# Patient Record
Sex: Female | Born: 1945 | Race: White | Hispanic: No | Smoking: Former smoker
Health system: Southern US, Community
[De-identification: ages and names within clinical notes are randomized; demographics above are authoritative.]

## PROBLEM LIST (undated history)

## (undated) DIAGNOSIS — H409 Unspecified glaucoma: Secondary | ICD-10-CM

## (undated) DIAGNOSIS — E785 Hyperlipidemia, unspecified: Secondary | ICD-10-CM

## (undated) DIAGNOSIS — C50212 Malignant neoplasm of upper-inner quadrant of left female breast: Principal | ICD-10-CM

## (undated) DIAGNOSIS — F419 Anxiety disorder, unspecified: Secondary | ICD-10-CM

## (undated) DIAGNOSIS — C50919 Malignant neoplasm of unspecified site of unspecified female breast: Secondary | ICD-10-CM

## (undated) DIAGNOSIS — N952 Postmenopausal atrophic vaginitis: Secondary | ICD-10-CM

## (undated) DIAGNOSIS — Z8719 Personal history of other diseases of the digestive system: Secondary | ICD-10-CM

## (undated) DIAGNOSIS — IMO0001 Reserved for inherently not codable concepts without codable children: Secondary | ICD-10-CM

## (undated) DIAGNOSIS — K219 Gastro-esophageal reflux disease without esophagitis: Secondary | ICD-10-CM

## (undated) DIAGNOSIS — K5790 Diverticulosis of intestine, part unspecified, without perforation or abscess without bleeding: Secondary | ICD-10-CM

## (undated) DIAGNOSIS — R7301 Impaired fasting glucose: Secondary | ICD-10-CM

## (undated) DIAGNOSIS — A64 Unspecified sexually transmitted disease: Secondary | ICD-10-CM

## (undated) DIAGNOSIS — IMO0002 Reserved for concepts with insufficient information to code with codable children: Secondary | ICD-10-CM

## (undated) DIAGNOSIS — B009 Herpesviral infection, unspecified: Secondary | ICD-10-CM

## (undated) DIAGNOSIS — N6009 Solitary cyst of unspecified breast: Secondary | ICD-10-CM

## (undated) HISTORY — DX: Malignant neoplasm of upper-inner quadrant of left female breast: C50.212

## (undated) HISTORY — DX: Hyperlipidemia, unspecified: E78.5

## (undated) HISTORY — DX: Reserved for concepts with insufficient information to code with codable children: IMO0002

## (undated) HISTORY — DX: Unspecified sexually transmitted disease: A64

## (undated) HISTORY — DX: Unspecified glaucoma: H40.9

## (undated) HISTORY — DX: Reserved for inherently not codable concepts without codable children: IMO0001

## (undated) HISTORY — PX: PELVIC LAPAROSCOPY: SHX162

## (undated) HISTORY — PX: BREAST CYST ASPIRATION: SHX578

## (undated) HISTORY — DX: Impaired fasting glucose: R73.01

## (undated) HISTORY — PX: UPPER GASTROINTESTINAL ENDOSCOPY: SHX188

## (undated) HISTORY — PX: FLEXIBLE SIGMOIDOSCOPY: SHX1649

## (undated) HISTORY — DX: Gastro-esophageal reflux disease without esophagitis: K21.9

## (undated) HISTORY — DX: Postmenopausal atrophic vaginitis: N95.2

## (undated) HISTORY — DX: Herpesviral infection, unspecified: B00.9

## (undated) HISTORY — DX: Anxiety disorder, unspecified: F41.9

## (undated) HISTORY — DX: Solitary cyst of unspecified breast: N60.09

## (undated) HISTORY — DX: Diverticulosis of intestine, part unspecified, without perforation or abscess without bleeding: K57.90

## (undated) HISTORY — DX: Gilbert syndrome: E80.4

## (undated) HISTORY — DX: Malignant neoplasm of unspecified site of unspecified female breast: C50.919

---
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
## 1973-05-07 HISTORY — PX: DILATION AND CURETTAGE OF UTERUS: SHX78

## 1976-05-07 HISTORY — PX: WISDOM TOOTH EXTRACTION: SHX21

## 1998-12-08 ENCOUNTER — Other Ambulatory Visit: Admission: RE | Admit: 1998-12-08 | Discharge: 1998-12-08 | Payer: Self-pay | Admitting: Obstetrics and Gynecology

## 1999-08-15 ENCOUNTER — Encounter: Admission: RE | Admit: 1999-08-15 | Discharge: 1999-08-15 | Payer: Self-pay | Admitting: *Deleted

## 1999-08-15 ENCOUNTER — Encounter: Payer: Self-pay | Admitting: *Deleted

## 2000-01-01 ENCOUNTER — Other Ambulatory Visit: Admission: RE | Admit: 2000-01-01 | Discharge: 2000-01-01 | Payer: Self-pay | Admitting: Obstetrics and Gynecology

## 2000-08-11 ENCOUNTER — Encounter: Payer: Self-pay | Admitting: Emergency Medicine

## 2000-08-11 ENCOUNTER — Ambulatory Visit (HOSPITAL_COMMUNITY): Admission: RE | Admit: 2000-08-11 | Discharge: 2000-08-11 | Payer: Self-pay | Admitting: Emergency Medicine

## 2000-08-15 ENCOUNTER — Other Ambulatory Visit: Admission: RE | Admit: 2000-08-15 | Discharge: 2000-08-15 | Payer: Self-pay | Admitting: Obstetrics and Gynecology

## 2000-08-16 ENCOUNTER — Encounter: Admission: RE | Admit: 2000-08-16 | Discharge: 2000-08-16 | Payer: Self-pay | Admitting: Obstetrics and Gynecology

## 2000-08-16 ENCOUNTER — Encounter: Payer: Self-pay | Admitting: Obstetrics and Gynecology

## 2002-04-23 ENCOUNTER — Other Ambulatory Visit: Admission: RE | Admit: 2002-04-23 | Discharge: 2002-04-23 | Payer: Self-pay | Admitting: Gynecology

## 2003-02-24 ENCOUNTER — Other Ambulatory Visit: Admission: RE | Admit: 2003-02-24 | Discharge: 2003-02-24 | Payer: Self-pay | Admitting: Radiology

## 2003-05-28 ENCOUNTER — Encounter: Admission: RE | Admit: 2003-05-28 | Discharge: 2003-05-28 | Payer: Self-pay | Admitting: Internal Medicine

## 2003-06-09 ENCOUNTER — Other Ambulatory Visit: Admission: RE | Admit: 2003-06-09 | Discharge: 2003-06-09 | Payer: Self-pay | Admitting: Obstetrics and Gynecology

## 2003-09-09 ENCOUNTER — Encounter: Payer: Self-pay | Admitting: Internal Medicine

## 2004-06-07 ENCOUNTER — Ambulatory Visit: Payer: Self-pay | Admitting: Internal Medicine

## 2004-07-13 ENCOUNTER — Other Ambulatory Visit: Admission: RE | Admit: 2004-07-13 | Discharge: 2004-07-13 | Payer: Self-pay | Admitting: Obstetrics and Gynecology

## 2004-10-23 ENCOUNTER — Ambulatory Visit: Payer: Self-pay | Admitting: Internal Medicine

## 2004-12-14 ENCOUNTER — Ambulatory Visit: Payer: Self-pay | Admitting: Internal Medicine

## 2005-01-31 ENCOUNTER — Ambulatory Visit: Payer: Self-pay | Admitting: Internal Medicine

## 2005-02-26 ENCOUNTER — Ambulatory Visit: Payer: Self-pay | Admitting: Internal Medicine

## 2005-04-16 ENCOUNTER — Ambulatory Visit: Payer: Self-pay | Admitting: Internal Medicine

## 2005-05-07 HISTORY — PX: COLONOSCOPY: SHX174

## 2005-05-28 ENCOUNTER — Ambulatory Visit: Payer: Self-pay | Admitting: Internal Medicine

## 2005-06-11 ENCOUNTER — Ambulatory Visit: Payer: Self-pay | Admitting: Internal Medicine

## 2005-06-25 ENCOUNTER — Ambulatory Visit: Payer: Self-pay | Admitting: Internal Medicine

## 2005-08-06 ENCOUNTER — Ambulatory Visit: Payer: Self-pay | Admitting: Internal Medicine

## 2005-08-13 ENCOUNTER — Ambulatory Visit: Payer: Self-pay | Admitting: Internal Medicine

## 2006-01-10 ENCOUNTER — Ambulatory Visit: Payer: Self-pay | Admitting: Internal Medicine

## 2006-01-17 ENCOUNTER — Other Ambulatory Visit: Admission: RE | Admit: 2006-01-17 | Discharge: 2006-01-17 | Payer: Self-pay | Admitting: Obstetrics and Gynecology

## 2006-01-17 ENCOUNTER — Ambulatory Visit: Payer: Self-pay | Admitting: Internal Medicine

## 2006-01-31 ENCOUNTER — Ambulatory Visit: Payer: Self-pay

## 2006-08-09 DIAGNOSIS — IMO0002 Reserved for concepts with insufficient information to code with codable children: Secondary | ICD-10-CM | POA: Insufficient documentation

## 2006-08-09 DIAGNOSIS — N6009 Solitary cyst of unspecified breast: Secondary | ICD-10-CM

## 2006-09-26 ENCOUNTER — Ambulatory Visit: Payer: Self-pay | Admitting: Internal Medicine

## 2006-09-27 LAB — CONVERTED CEMR LAB
ALT: 29 units/L (ref 0–40)
AST: 31 units/L (ref 0–37)
Hgb A1c MFr Bld: 6.1 % — ABNORMAL HIGH (ref 4.6–6.0)
Triglycerides: 111 mg/dL (ref 0–149)
VLDL: 22 mg/dL (ref 0–40)

## 2006-10-02 ENCOUNTER — Encounter: Payer: Self-pay | Admitting: Internal Medicine

## 2006-10-02 ENCOUNTER — Ambulatory Visit: Payer: Self-pay | Admitting: Internal Medicine

## 2006-12-18 ENCOUNTER — Ambulatory Visit: Payer: Self-pay | Admitting: Internal Medicine

## 2006-12-26 ENCOUNTER — Ambulatory Visit: Payer: Self-pay | Admitting: Internal Medicine

## 2006-12-26 ENCOUNTER — Encounter (INDEPENDENT_AMBULATORY_CARE_PROVIDER_SITE_OTHER): Payer: Self-pay | Admitting: *Deleted

## 2006-12-26 DIAGNOSIS — E785 Hyperlipidemia, unspecified: Secondary | ICD-10-CM

## 2006-12-26 LAB — CONVERTED CEMR LAB
Creatinine,U: 44.7 mg/dL
Microalb, Ur: 0.3 mg/dL (ref 0.0–1.9)

## 2007-01-03 ENCOUNTER — Ambulatory Visit: Payer: Self-pay | Admitting: Internal Medicine

## 2007-01-06 LAB — CONVERTED CEMR LAB
ALT: 22 units/L (ref 0–35)
AST: 22 units/L (ref 0–37)
Hgb A1c MFr Bld: 6 % (ref 4.6–6.0)
LDL Cholesterol: 134 mg/dL — ABNORMAL HIGH (ref 0–99)
Total CK: 60 units/L (ref 7–177)
VLDL: 28 mg/dL (ref 0–40)

## 2007-01-07 ENCOUNTER — Encounter (INDEPENDENT_AMBULATORY_CARE_PROVIDER_SITE_OTHER): Payer: Self-pay | Admitting: *Deleted

## 2007-01-14 ENCOUNTER — Telehealth (INDEPENDENT_AMBULATORY_CARE_PROVIDER_SITE_OTHER): Payer: Self-pay | Admitting: *Deleted

## 2007-03-19 ENCOUNTER — Encounter: Payer: Self-pay | Admitting: Internal Medicine

## 2007-03-26 ENCOUNTER — Other Ambulatory Visit: Admission: RE | Admit: 2007-03-26 | Discharge: 2007-03-26 | Payer: Self-pay | Admitting: Obstetrics and Gynecology

## 2007-08-20 ENCOUNTER — Telehealth (INDEPENDENT_AMBULATORY_CARE_PROVIDER_SITE_OTHER): Payer: Self-pay | Admitting: *Deleted

## 2007-08-29 ENCOUNTER — Ambulatory Visit: Payer: Self-pay | Admitting: Internal Medicine

## 2007-09-07 LAB — CONVERTED CEMR LAB
AST: 30 units/L (ref 0–37)
HDL: 44 mg/dL (ref >39.0)
Hgb A1c MFr Bld: 6.1 % — ABNORMAL HIGH (ref 4.6–6.0)
Total CHOL/HDL Ratio: 4.5

## 2007-09-09 ENCOUNTER — Encounter (INDEPENDENT_AMBULATORY_CARE_PROVIDER_SITE_OTHER): Payer: Self-pay | Admitting: *Deleted

## 2007-09-18 ENCOUNTER — Ambulatory Visit: Payer: Self-pay | Admitting: Internal Medicine

## 2007-12-25 ENCOUNTER — Ambulatory Visit: Payer: Self-pay | Admitting: Internal Medicine

## 2007-12-29 ENCOUNTER — Telehealth (INDEPENDENT_AMBULATORY_CARE_PROVIDER_SITE_OTHER): Payer: Self-pay | Admitting: *Deleted

## 2007-12-29 LAB — CONVERTED CEMR LAB
BUN: 14 mg/dL (ref 6–23)
CO2: 30 meq/L (ref 19–32)
Chloride: 106 meq/L (ref 96–112)
Cholesterol: 189 mg/dL (ref 0–200)
Creatinine, Ser: 0.6 mg/dL (ref 0.4–1.2)
GFR calc non Af Amer: 108 mL/min
Hgb A1c MFr Bld: 6 % (ref 4.6–6.0)
LDL Cholesterol: 130 mg/dL — ABNORMAL HIGH (ref 0–99)
Potassium: 4.2 meq/L (ref 3.5–5.1)
Triglycerides: 79 mg/dL (ref 0–149)

## 2008-01-01 ENCOUNTER — Ambulatory Visit: Payer: Self-pay | Admitting: Internal Medicine

## 2008-01-01 DIAGNOSIS — R739 Hyperglycemia, unspecified: Secondary | ICD-10-CM

## 2008-01-01 LAB — CONVERTED CEMR LAB
Cholesterol, target level: 200 mg/dL
HDL goal, serum: 50 mg/dL
LDL Goal: 100 mg/dL

## 2008-03-23 ENCOUNTER — Encounter: Payer: Self-pay | Admitting: Internal Medicine

## 2008-04-07 ENCOUNTER — Encounter: Payer: Self-pay | Admitting: Obstetrics and Gynecology

## 2008-04-07 ENCOUNTER — Other Ambulatory Visit: Admission: RE | Admit: 2008-04-07 | Discharge: 2008-04-07 | Payer: Self-pay | Admitting: Obstetrics and Gynecology

## 2008-04-07 ENCOUNTER — Ambulatory Visit: Payer: Self-pay | Admitting: Obstetrics and Gynecology

## 2008-10-27 ENCOUNTER — Ambulatory Visit: Payer: Self-pay | Admitting: Internal Medicine

## 2008-10-27 ENCOUNTER — Telehealth (INDEPENDENT_AMBULATORY_CARE_PROVIDER_SITE_OTHER): Payer: Self-pay | Admitting: *Deleted

## 2008-10-27 DIAGNOSIS — F411 Generalized anxiety disorder: Secondary | ICD-10-CM | POA: Insufficient documentation

## 2008-11-16 ENCOUNTER — Telehealth (INDEPENDENT_AMBULATORY_CARE_PROVIDER_SITE_OTHER): Payer: Self-pay | Admitting: *Deleted

## 2008-11-30 ENCOUNTER — Ambulatory Visit: Payer: Self-pay | Admitting: Internal Medicine

## 2008-12-09 LAB — CONVERTED CEMR LAB
AST: 25 units/L (ref 0–37)
Albumin: 4.7 g/dL (ref 3.5–5.2)
Alkaline Phosphatase: 55 units/L (ref 39–117)
Alkaline Phosphatase: 55 units/L (ref 39–117)
Bilirubin, Direct: 0 mg/dL (ref 0.0–0.3)
Bilirubin, Direct: 0.2 mg/dL (ref 0.0–0.3)
HDL: 43 mg/dL (ref >39.00)
HDL: 46 mg/dL (ref >39)
Hgb A1c MFr Bld: 6 % (ref 4.6–6.5)
LDL Cholesterol: 130 mg/dL — ABNORMAL HIGH (ref 0–99)
Total Bilirubin: 1 mg/dL (ref 0.3–1.2)
Total CHOL/HDL Ratio: 4
Total CHOL/HDL Ratio: 4.4
Triglycerides: 114 mg/dL (ref 0.0–149.0)
VLDL: 22.8 mg/dL (ref 0.0–40.0)

## 2008-12-24 ENCOUNTER — Ambulatory Visit: Payer: Self-pay | Admitting: Internal Medicine

## 2009-03-24 ENCOUNTER — Encounter: Payer: Self-pay | Admitting: Internal Medicine

## 2009-04-08 ENCOUNTER — Other Ambulatory Visit: Admission: RE | Admit: 2009-04-08 | Discharge: 2009-04-08 | Payer: Self-pay | Admitting: Obstetrics and Gynecology

## 2009-04-08 ENCOUNTER — Ambulatory Visit: Payer: Self-pay | Admitting: Obstetrics and Gynecology

## 2009-05-19 ENCOUNTER — Telehealth (INDEPENDENT_AMBULATORY_CARE_PROVIDER_SITE_OTHER): Payer: Self-pay | Admitting: *Deleted

## 2009-06-13 ENCOUNTER — Telehealth (INDEPENDENT_AMBULATORY_CARE_PROVIDER_SITE_OTHER): Payer: Self-pay | Admitting: *Deleted

## 2009-06-28 ENCOUNTER — Telehealth: Payer: Self-pay | Admitting: Internal Medicine

## 2009-06-28 ENCOUNTER — Encounter: Payer: Self-pay | Admitting: Internal Medicine

## 2009-07-06 ENCOUNTER — Ambulatory Visit: Payer: Self-pay | Admitting: Internal Medicine

## 2009-07-07 ENCOUNTER — Encounter: Payer: Self-pay | Admitting: Internal Medicine

## 2009-07-14 ENCOUNTER — Ambulatory Visit: Payer: Self-pay | Admitting: Internal Medicine

## 2009-07-14 DIAGNOSIS — R9431 Abnormal electrocardiogram [ECG] [EKG]: Secondary | ICD-10-CM

## 2009-07-14 DIAGNOSIS — K219 Gastro-esophageal reflux disease without esophagitis: Secondary | ICD-10-CM

## 2009-07-25 ENCOUNTER — Ambulatory Visit: Payer: Self-pay | Admitting: Cardiology

## 2009-07-27 ENCOUNTER — Encounter: Payer: Self-pay | Admitting: Internal Medicine

## 2009-07-27 ENCOUNTER — Telehealth (INDEPENDENT_AMBULATORY_CARE_PROVIDER_SITE_OTHER): Payer: Self-pay | Admitting: *Deleted

## 2009-08-05 ENCOUNTER — Ambulatory Visit: Payer: Self-pay

## 2009-08-05 ENCOUNTER — Encounter: Payer: Self-pay | Admitting: Cardiology

## 2009-08-05 ENCOUNTER — Ambulatory Visit: Payer: Self-pay | Admitting: Cardiovascular Disease

## 2009-08-05 ENCOUNTER — Ambulatory Visit (HOSPITAL_COMMUNITY): Admission: RE | Admit: 2009-08-05 | Discharge: 2009-08-05 | Payer: Self-pay | Admitting: Cardiology

## 2009-08-11 ENCOUNTER — Ambulatory Visit: Payer: Self-pay | Admitting: Internal Medicine

## 2009-08-15 ENCOUNTER — Telehealth (INDEPENDENT_AMBULATORY_CARE_PROVIDER_SITE_OTHER): Payer: Self-pay | Admitting: *Deleted

## 2009-08-24 ENCOUNTER — Ambulatory Visit: Payer: Self-pay | Admitting: Cardiology

## 2009-08-29 ENCOUNTER — Telehealth: Payer: Self-pay | Admitting: Cardiology

## 2009-09-23 ENCOUNTER — Ambulatory Visit: Payer: Self-pay | Admitting: Internal Medicine

## 2009-09-27 ENCOUNTER — Telehealth: Payer: Self-pay | Admitting: Internal Medicine

## 2009-09-27 LAB — CONVERTED CEMR LAB
ALT: 29 units/L (ref 0–35)
AST: 29 units/L (ref 0–37)
Albumin: 4.2 g/dL (ref 3.5–5.2)
Alkaline Phosphatase: 54 units/L (ref 39–117)
Cholesterol: 208 mg/dL — ABNORMAL HIGH (ref 0–200)
Total CHOL/HDL Ratio: 4
Triglycerides: 135 mg/dL (ref 0.0–149.0)

## 2009-09-28 ENCOUNTER — Ambulatory Visit: Payer: Self-pay | Admitting: Internal Medicine

## 2009-09-28 DIAGNOSIS — R42 Dizziness and giddiness: Secondary | ICD-10-CM

## 2009-09-28 DIAGNOSIS — I8390 Asymptomatic varicose veins of unspecified lower extremity: Secondary | ICD-10-CM | POA: Insufficient documentation

## 2009-09-28 DIAGNOSIS — R51 Headache: Secondary | ICD-10-CM

## 2009-09-28 DIAGNOSIS — M79609 Pain in unspecified limb: Secondary | ICD-10-CM

## 2009-09-28 DIAGNOSIS — R519 Headache, unspecified: Secondary | ICD-10-CM | POA: Insufficient documentation

## 2009-10-11 ENCOUNTER — Encounter: Payer: Self-pay | Admitting: Internal Medicine

## 2009-10-21 ENCOUNTER — Telehealth (INDEPENDENT_AMBULATORY_CARE_PROVIDER_SITE_OTHER): Payer: Self-pay | Admitting: *Deleted

## 2010-01-26 ENCOUNTER — Encounter: Payer: Self-pay | Admitting: Internal Medicine

## 2010-01-26 ENCOUNTER — Ambulatory Visit: Payer: Self-pay | Admitting: Obstetrics and Gynecology

## 2010-03-28 ENCOUNTER — Encounter: Payer: Self-pay | Admitting: Internal Medicine

## 2010-04-13 ENCOUNTER — Encounter: Payer: Self-pay | Admitting: Internal Medicine

## 2010-04-13 ENCOUNTER — Emergency Department (HOSPITAL_COMMUNITY)
Admission: EM | Admit: 2010-04-13 | Discharge: 2010-04-13 | Payer: Self-pay | Source: Home / Self Care | Admitting: Emergency Medicine

## 2010-04-13 ENCOUNTER — Telehealth: Payer: Self-pay | Admitting: Internal Medicine

## 2010-04-14 ENCOUNTER — Telehealth: Payer: Self-pay | Admitting: Internal Medicine

## 2010-04-17 ENCOUNTER — Ambulatory Visit: Payer: Self-pay | Admitting: Internal Medicine

## 2010-04-17 DIAGNOSIS — R079 Chest pain, unspecified: Secondary | ICD-10-CM | POA: Insufficient documentation

## 2010-04-19 ENCOUNTER — Ambulatory Visit: Payer: Self-pay | Admitting: Obstetrics and Gynecology

## 2010-06-01 ENCOUNTER — Encounter: Payer: Self-pay | Admitting: Internal Medicine

## 2010-06-04 LAB — CONVERTED CEMR LAB
ALT: 39 units/L — ABNORMAL HIGH (ref 0–35)
Albumin: 4.3 g/dL (ref 3.5–5.2)
Basophils Relative: 0.1 % (ref 0.0–3.0)
Bilirubin, Direct: 0.2 mg/dL (ref 0.0–0.3)
CO2: 32 meq/L (ref 19–32)
Chloride: 106 meq/L (ref 96–112)
Creatinine, Ser: 0.6 mg/dL (ref 0.4–1.2)
Eosinophils Absolute: 0 10*3/uL (ref 0.0–0.7)
Eosinophils Relative: 0.5 % (ref 0.0–5.0)
HCT: 39.6 % (ref 36.0–46.0)
Hemoglobin: 13 g/dL (ref 12.0–15.0)
Hgb A1c MFr Bld: 6 % (ref 4.6–6.5)
Lymphs Abs: 0.5 10*3/uL — ABNORMAL LOW (ref 0.7–4.0)
MCHC: 32.8 g/dL (ref 30.0–36.0)
MCV: 89.6 fL (ref 78.0–100.0)
Monocytes Absolute: 0.6 10*3/uL (ref 0.1–1.0)
Neutro Abs: 2.9 10*3/uL (ref 1.4–7.7)
Potassium: 3.9 meq/L (ref 3.5–5.1)
RBC: 4.42 M/uL (ref 3.87–5.11)
TSH: 1.2 microintl units/mL (ref 0.35–5.50)
Total Protein: 7.7 g/dL (ref 6.0–8.3)
WBC: 4 10*3/uL — ABNORMAL LOW (ref 4.5–10.5)

## 2010-06-06 NOTE — Progress Notes (Signed)
Summary: Review records  Phone Note Call from Patient Call back at Work Phone [phone]   Caller: Patient Summary of Call: patient is scheduled for hospital followup (chest pain, left arm tingling) appointment for Mon 04/17/2010 at 11:15am--she saw Dr Donnetta Hutching at ER---patient would like Dr Alwyn Ren to review her hospital records before she sees him on Monday---FYI=per patient, heart tests are OK--maybe digestive problems?? Initial call taken by: Jerolyn Shin,  April 14, 2010 9:27 AM  Follow-up for Phone Call        I already left message on machine for the patient to call back to office.  Lucious Groves CMA  April 14, 2010 9:54 AM   No return call from patient, she has appt on Monday. Closed phone note. Lucious Groves CMA  April 14, 2010 5:27 PM

## 2010-06-06 NOTE — Miscellaneous (Signed)
Summary: Orders Update   Clinical Lists Changes  Problems: Added new problem of INGROWN TOENAIL (ICD-703.0) Orders: Added new Referral order of Podiatry Referral (Podiatry) - Signed

## 2010-06-06 NOTE — Progress Notes (Signed)
Summary: LABS ORDER  Phone Note Call from Patient Call back at [phone]   Caller: Patient Summary of Call: PATTIENT IS COMING IN FOR LABS ON 07/06/09 JUST NEED ORDERS TO PUT ON THAT DATE. Initial call taken by: Freddy Jaksch,  June 13, 2009 10:17 AM  Follow-up for Phone Call        Please schedule a follow-up appointment in 6 months. 3)  NMR Lipoprofile Lipid Panel prior to visit, ICD-9:272.4 4)  HbgA1C prior to visit, ICD-9:790.29.   copied and pasted from OV 12/24/2008 Follow-up by: Shonna Chock,  June 13, 2009 1:47 PM

## 2010-06-06 NOTE — Assessment & Plan Note (Signed)
Summary: per check out/sf  Medications Added PX ENTERIC ASPIRIN 81 MG TBEC (ASPIRIN) take 2 asa daily * ZYCAM when needed      Allergies Added:   Primary Provider:  Marga Melnick MD  CC:  follow up to echo.  Pt feeling well.  pt has questions about Lovaza.  History of Present Illness: 65 yo with history of hyperlipidemia presents for cardiac evaluation.  She has had high cholesterol for many years and has been on escalating doses of statins over time, now on Crestor 20 mg daily.  She has been intolerant to Lovaza and Zetia.  She had recent lipids done on Crestor showing LDL 164 with a trend towards small particle size. Patient also had a recent probable pneumonia with significant exertional shortness of breath, but this has resolved with a course of antibiotics.  She had an echo done which showed EF 55-60%, normal wall motion, and no significant valvular abnormalities. She continues to exercise for 30 minutes on the treadmill at the [facility] without trouble.    ECG: NSR, inferior and anterolateral T wave inversions (same as 9/07)  Labs (3/11): K 3.9, creatinine 0.6, TSH normal, LDL 164 (predominant small particle size), HDL 53, TGs 118   Current Medications (verified): 1)  Crestor 40 Mg Tabs (Rosuvastatin Calcium) .... One Tablet Daily 2)  Ibuprofen Prn 3)  Ranitidine Hcl 150 Mg Tabs (Ranitidine Hcl) .[name].. 1 Two Times A Day Pre Meals As Needed 4)  Vitamin C 500 Mg Tabs (Ascorbic Acid) .[name].. 1 By Mouth Once Daily 5)  Echinacea 400 Mg Caps (Echinacea) .... 2-3 X Weekly 6)  Px Enteric Aspirin 81 Mg Tbec (Aspirin) .... Take 2 Asa Daily 7)  Zycam .... When Needed  Allergies (verified): 1)  ! Tylenol 2)  ! Prednisone 3)  ! * Fish Oil 4)  * Novacaine 5)  Pravachol  Past History:  Past Medical History: 1. Fasting Hyperglycemia (790.29) 2. HYPERLIPIDEMIA NEC/NOS (ICD-272.4): NMR 07/06/2009: LDL 164(2754/1680), HDL 53,TG 118 (these results are on Crestor 20 mg once daily !); NS ST-T  changes; negative Nuclear Stress Test X 2 3. BREAST CYST, LEFT (ICD-610.0);BREAST CYST, RIGHT (ICD-610.0) 4. DEGENERATIVE DISC DISEASE (ICD-722.6) 5. GILBERT'S SYNDROME (ICD-277.4) 6. GERD 7. Myoview (9/07): exercised 6'15", EF 80%, no ischemia or infarction.   8. Echo (4/11): EF 55-60%, normal wall motion, no significant valvular abnormalities.   Family History: Reviewed history from 07/25/2009 and no changes required. mother endometriosis,lung cancer( smoker) maternal grandmother stomach cancer maternal uncle liver cancer maternal uncle leukemia paternal great aunt breast and colon cancer father bypass surgery at 35, CAD, intestinal carcinoid tumors paternal grandmother aneursym of thoracic aorta died  post surgery at 15 paternal grand father CVA  paternal side high cholesterol sister died at  age of 1 week  related to  congenital deformity ,? esophageal atresia  Social History: Reviewed history from 07/25/2009 and no changes required. Former Smoker, quit 2000 Alcohol use-yes: 1 light beer / day Occupation:  Loss adjuster, chartered for [employer] Regular exercise-yes: gym 1X/week Lives alone.   Vital Signs:  Patient profile:   65 year old female Height:      64 inches Weight:      171 pounds Pulse rate:   78 / minute Pulse rhythm:   regular BP sitting:   108 / 62  (left arm) Cuff size:   regular  Vitals Entered By: Judithe Modest CMA (August 24, 2009 4:43 PM)  Physical Exam  General:  Well developed, well nourished, in no  acute distress. Neck:  Neck supple, no JVD. No masses, thyromegaly or abnormal cervical nodes. Lungs:  Clear bilaterally to auscultation and percussion. Heart:  Non-displaced PMI, chest non-tender; regular rate and rhythm, S1, S2 without murmurs, rubs or gallops. Carotid upstroke normal, no bruit.  Pedals normal pulses. No edema, no varicosities. Abdomen:  Bowel sounds positive; abdomen soft and non-tender without masses, organomegaly, or hernias noted. No  hepatosplenomegaly. Extremities:  No clubbing or cyanosis. Neurologic:  Alert and oriented x 3. Psych:  Normal affect.   Impression & Recommendations:  Problem # 1:  SHORTNESS OF BREATH (ICD-786.05) This was likely due to pneumonia which has cleared up on the most recent CXR.  Echo was unremarkable.   Problem # 2:  HYPERLIPIDEMIA NEC/NOS (ICD-272.4) Patient has increased her Crestor up to 40 mg daily.  I will also have her take over the counter fish oil 2000 mg daily (she should refrigerate it to decrease the taste).  Lipids/LFTs in 5/11.   Patient Instructions: 1)  Your physician recommends that you schedule a follow-up appointment in: as needed with Dr. Shirlee Latch

## 2010-06-06 NOTE — Progress Notes (Signed)
Summary: OTC meds  Phone Note Call from Patient Call back at [phone]   Caller: Patient Summary of Call: pt states that she has a head cold now with some laryngitis. pt would like to know what meds she can take OTC  that will not interact with her current meds................Marland KitchenFelecia Deloach CMA  May 19, 2009 3:37 PM   Follow-up for Phone Call        per dr hopper pt can try Zicam, vitamin C2000 units,echinacea x5-7 days, neti pot if congestion  Additional Follow-up for Phone Call Additional follow up Details #1::        left message to call  office................Marland KitchenFelecia Deloach CMA  May 19, 2009 5:13 PM  pt aware, OVINB...............Marland KitchenFelecia Deloach CMA  May 20, 2009 8:37 AM

## 2010-06-06 NOTE — Progress Notes (Signed)
Summary: crestor savings program   Phone Note Call from Patient Call back at [phone]   Caller: Patient Reason for Call: Talk to Nurse Summary of Call: pt calling about crestor saving card, wants Dr Shirlee Latch to join.... that way she will get a discount  Initial call taken by: Migdalia Dk,  August 29, 2009 2:48 PM  Follow-up for Phone Call        talked with pt--has Crestor savings card-Crestor increased to 40mg  daily-Dr Shirlee Latch is not a participating MD, wants Dr Shirlee Latch to join the program--I talked with Jordan Hawks -pt paid $35 for RX --I talked with Quincy Sheehan with Crestor [phone]) and he stated unless Crestor savings card had expired or pt had Medicare/Medicaid  pt should only pay $25 per prescription--per Doug-Dr Shirlee Latch does not have to register with the program--Doug suggested pt present her card again at [facility] to get $25 copay-I talked with pt again and she will followup at [facility] and let me know if she has more problems

## 2010-06-06 NOTE — Progress Notes (Signed)
Summary: Chest Xray Results/Patient return call/please call back  Phone Note Outgoing Call Call back at Work Phone [phone]   Call placed by: Shonna Chock,  July 27, 2009 8:26 AM Call placed to: Patient Summary of Call: Left message on machine for patient to return call when avaliable, Reason for call:   She should blow up 10 balloons / day to fully reinflate the lung & recheck CXray after 2 weeks( code 793.1) Chrae Malloy  July 27, 2009 8:26 AM   Follow-up for Phone Call        Spoke with patient, patient ok'd all instruction. Order for Chest Xray to be mailed to patient Follow-up by: Shonna Chock,  July 27, 2009 2:34 PM    Additional Follow-up for Phone Call Additional follow up Details #2::    Patient returned called. Please call her back. Follow-up by: Barb Merino,  July 27, 2009 1:04 PM

## 2010-06-06 NOTE — Assessment & Plan Note (Signed)
Summary: np6/HYPERLIPIDEMIA NEC/NOS  Medications Added CRESTOR 40 MG TABS (ROSUVASTATIN CALCIUM) one tablet daily ASPIRIN 81 MG TABS (ASPIRIN) take one tablet two times a day * ZYCAM       Allergies Added:   Primary Provider:  Marga Melnick MD  CC:  new patient hyperlipidemia nec/  Pt states she feels fine however she is recooperating from bronchitis.  She is SOB if she exerts herself.  .  History of Present Illness: 65 yo with history of hyperlipidemia for many years presents for cardiac evaluation.  She has had high cholesterol for many years and has been on escalating doses of statins over time, now on Crestor 20 mg daily.  She has been intolerant to Lovaza and Zetia.  She had recent lipids done on Crestor showing LDL 164 with a trend towards small particle size.  Patient had acute bronchitis that began about 2 weeks ago with fever to 100.4, pleuritic chest pain, and exertional shortness of breath.  She took a course of azithromycin.  She is feeling better now.  She still is short of breath after walking about 1 block or up a flight of steps but is overall doing much better compared to a couple of weeks ago.  No more fever.  Baseline she has no significant exertional dyspnea or chest pain.    ECG: NSR, inferior and anterolateral T wave inversions (same as 9/07)  Labs (3/11): K 3.9, creatinine 0.6, TSH normal, LDL 164 (predominant small particle size), HDL 53, TGs 118   Current Medications (verified): 1)  Crestor 20 Mg Tabs (Rosuvastatin Calcium) .[name].. 1 By Mouth Once Daily 2)  Ibuprofen Prn 3)  Ranitidine Hcl 150 Mg Tabs (Ranitidine Hcl) .[name].. 1 Two Times A Day Pre Meals As Needed 4)  Vitamin C 500 Mg Tabs (Ascorbic Acid) .[name].. 1 By Mouth Once Daily 5)  Echinacea 400 Mg Caps (Echinacea) .... 2-3 X Weekly 6)  Azithromycin 250 Mg Tabs (Azithromycin) .... As Per Pack 7)  Aspirin 81 Mg Tabs (Aspirin) .... Take One Tablet Two Times A Day 8)  Zycam  Allergies (verified): 1)  !  Tylenol 2)  ! Prednisone 3)  ! * Fish Oil 4)  * Novacaine 5)  Pravachol  Past History:  Past Medical History: 1. Fasting Hyperglycemia (790.29) 2. HYPERLIPIDEMIA NEC/NOS (ICD-272.4): NMR 07/06/2009: LDL 164(2754/1680), HDL 53,TG 118 (these results are on Crestor 20 mg once daily !); NS ST-T changes; negative Nuclear Stress Test X 2 3. BREAST CYST, LEFT (ICD-610.0);BREAST CYST, RIGHT (ICD-610.0) 4. DEGENERATIVE DISC DISEASE (ICD-722.6) 5. GILBERT'S SYNDROME (ICD-277.4) 6. GERD 7. Myoview (9/07): exercised 6'15", EF 80%, no ischemia or infarction.    Family History: mother endometriosis,lung cancer( smoker) maternal grandmother stomach cancer maternal uncle liver cancer maternal uncle leukemia paternal great aunt breast and colon cancer father bypass surgery at 67, CAD, intestinal carcinoid tumors paternal grandmother aneursym of thoracic aorta died  post surgery at 80 paternal grand father CVA  paternal side high cholesterol sister died at  age of 1 week  related to  congenital deformity ,? esophageal atresia  Social History: Former Smoker, quit 2000 Alcohol use-yes: 1 light beer / day Occupation:  Loss adjuster, chartered for church Regular exercise-yes: gym 1X/week Lives alone.   Review of Systems       All systems reviewed and negative except as per HPI  Vital Signs:  Patient profile:   65 year old female Height:      64 inches Weight:      170 pounds  BMI:     29.29 O2 Sat:      94 % on Room air Pulse rate:   80 / minute Pulse rhythm:   regular BP sitting:   116 / 72  (left arm) Cuff size:   regular  Vitals Entered By: Judithe Modest CMA (July 25, 2009 4:16 PM)  O2 Flow:  Room air  Physical Exam  General:  Well developed, well nourished, in no acute distress. Head:  normocephalic and atraumatic Nose:  no deformity, discharge, inflammation, or lesions Mouth:  Teeth, gums and palate normal. Oral mucosa normal. Neck:  Neck supple, no JVD. No masses, thyromegaly  or abnormal cervical nodes. Lungs:  Bilateral rhonchi Heart:  Non-displaced PMI, chest non-tender; regular rate and rhythm, S1, S2 without murmurs, rubs or gallops. Carotid upstroke normal, no bruit.  Pedals normal pulses. No edema, no varicosities. Abdomen:  Bowel sounds positive; abdomen soft and non-tender without masses, organomegaly, or hernias noted. No hepatosplenomegaly. Msk:  Back normal, normal gait. Muscle strength and tone normal. Extremities:  No clubbing or cyanosis. Neurologic:  Alert and oriented x 3. Skin:  Intact without lesions or rashes. Psych:  Normal affect.   Impression & Recommendations:  Problem # 1:  HYPERLIPIDEMIA NEC/NOS (ICD-272.4) LDL is still quite high with a trend toward higher risk low particle sizes on NMR.  Her cardiac risk factors are hyperlipidemia and family history (though not premature).  I am going to have her increase Crestor to 40 mg daily.  She also needs to improve her diet and exercise more, both of which we talked about.  Lipids/LFTs in 2 months.   Problem # 2:  SHORTNESS OF BREATH (ICD-786.05) Episode of acute bronchitis beginning about 2 wks ago.  Feeling better but still with some exertional dyspnea.  Completed azithromycin course. Has significant rhonchi on exam, will get CXR to review for PNA.  Given the stable but abnormal ECG with T wave inversions, will get an echocardiogram.    Other Orders: Echocardiogram (Echo) T-2 View CXR (71020TC)  Patient Instructions: 1)  Your physician has recommended you make the following change in your medication:  2)  Increase Crestor to 40mg  daily 3)  Your physician recommends that you return for a FASTING lipid profile/liver profile in 2 months--786.09 272.0  4)  Your physician has requested that you have an echocardiogram.  Echocardiography is a painless test that uses sound waves to create images of your heart. It provides your doctor with information about the size and shape of your heart and how  well your heart's chambers and valves are working.  This procedure takes approximately one hour. There are no restrictions for this procedure. 5)  Your physician recommends that you schedule a follow-up appointment in: 1 month with Dr Shirlee Latch. Prescriptions: CRESTOR 40 MG TABS (ROSUVASTATIN CALCIUM) one tablet daily  #30 x 3   Entered by:   Katina Dung, RN, BSN   Authorized by:   Marca Ancona, MD   Signed by:   Katina Dung, RN, BSN on 07/25/2009   Method used:   Electronically to        [facility] (retail)       [phone]       [place], [place]  [address]       Ph: [phone]       Fax: [phone]   RxID:   [id number]

## 2010-06-06 NOTE — Letter (Signed)
Summary: Letter with Patient Concerns  Letter with Patient Concerns   Imported By: Lanelle Bal 10/27/2009 10:01:27  _____________________________________________________________________  External Attachment:    Type:   Image     Comment:   External Document

## 2010-06-06 NOTE — Progress Notes (Signed)
Summary: INFO ABOUT WED 5/25 OFFICE VISIT--PLS READ BEFORE PT ARRIVES  Phone Note Call from Patient   Caller: Patient Summary of Call: PATIENT DROPPED OFF SEALED ENVELOPE LATE TUESDAY--SAYS IT IS FOR Lindsey Barrett TO READ BEFORE HER WEDNESDAY 5/25 AT 8:00AM APPOINTMENT WITH Lindsey Barrett---SAID "IT IS TO LET HIM KNOW REASON FOR VISIT--WHAT SHE NEEDS TO TALK ABOUT"  WILL TAKE TO Lindsey Barrett'S LEDGE IN FRONT OF CHRAE'S DESK Initial call taken by: Lindsey Barrett,  Sep 27, 2009 5:28 PM  Follow-up for Phone Call        reviewed Follow-up by: Marga Melnick MD,  Sep 28, 2009 6:29 AM

## 2010-06-06 NOTE — Miscellaneous (Signed)
Summary: Orders Update  Clinical Lists Changes  Orders: Added new Test order of T-2 View CXR (71020TC) - Signed 

## 2010-06-06 NOTE — Progress Notes (Signed)
Summary: PODIATRY REFERRAL REQUEST  Phone Note Call from Patient Call back at Work Phone [phone] Call back at [phone] CELL   Caller: Patient Call For: Marga Melnick MD Reason for Call: Referral Summary of Call: PT REQUESTING REFERRAL TO PODIATRY FOR INGROWN TOENAIL ON 1ST BIG TOE ON RT FOOT.  PER [facility], DR. HOPPER OK'D. Initial call taken by: Magdalen Spatz [facility],  June 28, 2009 8:47 AM

## 2010-06-06 NOTE — Assessment & Plan Note (Signed)
Summary: leg hurts to touch/cbs   Vital Signs:  Patient profile:   65 year old female Weight:      170 pounds Temp:     98.2 degrees F oral Pulse rate:   60 / minute Resp:     16 per minute BP sitting:   110 / 68  (left arm) Cuff size:   regular  Vitals Entered By: Shonna Chock (Sep 28, 2009 8:08 AM) CC: 1.) Left leg tender to the touch  2.) Several other concerns (? if time allowed to address), Headaches Comments REVIEWED MED LIST, PATIENT AGREED DOSE AND INSTRUCTION CORRECT    Primary Care Provider:  Marga Melnick MD  CC:  1.) Left leg tender to the touch  2.) Several other concerns (? if time allowed to address) and Headaches.  History of Present Illness: Lindsey Barrett has multiple concerns; see wtyped notes.She is concerned about "blood clot or varicose vein " in LLE due to tendernsss inpopliteal area & upper calf. Additionally she noted a L sided headache 09/23/2009. The patient denies nausea, vomiting, sweats, tearing of eyes, nasal congestion, sinus pain, sinus pressure, photophobia, and phonophobia.  The headache is described as intermittent and sharp.  The location of the pain is unilateral on the left.  The patient denies the following high-risk features: fever, neck pain/stiffness, vision loss or change, focal weakness, altered mental status, rash, trauma, pain worse with exertion, new type of headache, and age >50 years.  The headaches are precipitated by stress; excess work demands in past 2 weeks. No Rx to date. Some dizziness unassociated with he headache.Increased anxiety about these symptoms.  No constellation of ha, flushing, chest pain & diarrhea.  Allergies: 1)  ! Tylenol 2)  ! Prednisone 3)  ! * Fish Oil 4)  * Novacaine 5)  Pravachol  Review of Systems General:  Complains of sweats; denies chills. Eyes:  Denies blurring, double vision, and vision loss-both eyes. ENT:  Denies decreased hearing, ear discharge, and ringing in ears; No frontal headache, facial pain or  purulence. CV:  Occa palpitations. Resp:  Denies chest pain with inspiration, coughing up blood, and shortness of breath. Neuro:  Denies brief paralysis, numbness, sensation of room spinning, tingling, and weakness; No BPV symptoms. Psych:  Complains of anxiety, depression, easily angered, easily tearful, and irritability; denies panic attacks; ? "hyperventilation".  Physical Exam  General:  well-nourished,in no acute distress; alert,appropriate and cooperative throughout examination Eyes:  No corneal or conjunctival inflammation noted. EOMI. Perrla. Field of Vision grossly normal. No nystagmus Ears:  External ear exam shows no significant lesions or deformities.  Otoscopic examination reveals clear canals, tympanic membranes are intact bilaterally without bulging, retraction, inflammation or discharge. Hearing is grossly normal bilaterally. Some wax bilaterally Mouth:  Oral mucosa and oropharynx without lesions or exudates.  Teeth in good repair. No tongue deviation Heart:  Normal rate and regular rhythm. S1 and S2 normal without gallop, murmur, click, rub or other extra sounds. Pulses:  R and L carotid,radial,dorsalis pedis and posterior tibial pulses are full and equal bilaterally Extremities:  No clubbing, cyanosis, edema.Negative Homan's. Tender over L lateral superior calf @ site of VV Neurologic:  alert & oriented X3, cranial nerves II-XII intact, strength normal in all extremities, sensation intact to light touch, gait normal, DTRs symmetrical and normal, finger-to-nose normal, heel-to-shin normal, and Romberg negative.   Cervical Nodes:  No lymphadenopathy noted Axillary Nodes:  No palpable lymphadenopathy Psych:  memory intact for recent and remote, normally interactive, good eye  contact, not anxious appearing, and not depressed appearing.     Impression & Recommendations:  Problem # 1:  HEADACHE (ICD-784.0)  Her updated medication list for this problem includes:    Px Enteric  Aspirin 81 Mg Tbec (Aspirin) .[name]... Take 2 asa daily  Problem # 2:  DIZZINESS (ICD-780.4)  Problem # 3:  VARICOSE VEINS, LOWER EXTREMITIES (ICD-454.9)  Problem # 4:  CALF PAIN, LEFT (ICD-729.5) Due to #3  Problem # 5:  ANXIETY (ICD-300.00) Pathophysiology of Serotonin Deficiency discussed Her updated medication list for this problem includes:    Citalopram Hydrobromide 20 Mg Tabs (Citalopram hydrobromide) .[name]... 1 once daily  Complete Medication List: 1)  Crestor 40 Mg Tabs (Rosuvastatin calcium) .... One tablet daily 2)  Ibuprofen Prn  3)  Ranitidine Hcl 150 Mg Tabs (Ranitidine hcl) .[name].. 1 two times a day pre meals as needed 4)  Vitamin C 500 Mg Tabs (Ascorbic acid) .[name].. 1 by mouth once daily 5)  Echinacea 400 Mg Caps (Echinacea) .... 2-3 x weekly 6)  Px Enteric Aspirin 81 Mg Tbec (Aspirin) .... Take 2 asa daily 7)  Zycam  .... When needed 8)  Niaspan 1000 Mg Cr-tabs (Niacin (antihyperlipidemic)) .[name].. 1 each eve as directed 9)  Citalopram Hydrobromide 20 Mg Tabs (Citalopram hydrobromide) .[name].. 1 once daily  Patient Instructions: 1)  Referral to Vein Specialist if calf pain persists or progresses. Prescriptions: CITALOPRAM HYDROBROMIDE 20 MG TABS (CITALOPRAM HYDROBROMIDE) 1 once daily  #30 x 5   Entered and Authorized by:   Marga Melnick MD   Signed by:   Marga Melnick MD on 09/28/2009   Method used:   Print then Give to Patient   RxID:   [id number] NIASPAN 1000 MG CR-TABS (NIACIN (ANTIHYPERLIPIDEMIC)) 1 each eve as directed  #30 x 5   Entered and Authorized by:   Marga Melnick MD   Signed by:   Marga Melnick MD on 09/28/2009   Method used:   Print then Give to Patient   RxID:   [id number]

## 2010-06-06 NOTE — Assessment & Plan Note (Signed)
Summary: MR5 //PH   Vital Signs:  Patient profile:   65 year old female Weight:      172.2 pounds O2 Sat:      96 % Temp:     100.4 degrees F oral Pulse rate:   84 / minute Resp:     14 per minute BP sitting:   114 / 62  (left arm) Cuff size:   large  Vitals Entered By: Shonna Chock (July 14, 2009 10:55 AM) CC: 1.) CPX and discuss fasting labs 2.) Cold: cough and fever  Comments REVIEWED MED LIST, PATIENT AGREED DOSE AND INSTRUCTION CORRECT    CC:  1.) CPX and discuss fasting labs 2.) Cold: cough and fever .  History of Present Illness: Lindsey Barrett is here for an annual physical; she has developed a URI as ST 07/12/2009, responsive to Echinacea, Zicam, & vitamin C. She has NP cough with fever.  Preventive Screening-Counseling & Management  Caffeine-Diet-Exercise     Does Patient Exercise: yes  Allergies: 1)  ! Tylenol 2)  ! Prednisone 3)  ! * Fish Oil 4)  * Novacaine 5)  Pravachol  Past History:  Past Medical History: Fasting Hyperglycemia (790.29) HYPERLIPIDEMIA NEC/NOS (ICD-272.4): NMR 07/06/2009: LDL 164(2754/1680), HDL 53,TG 118(these results are on Crestor 20 mg once daily !); NS ST-T changes; negative Nuclear Stress Test X 2 BREAST CYST, LEFT (ICD-610.0);BREAST CYST, RIGHT (ICD-610.0) DEGENERATIVE DISC DISEASE (ICD-722.6) GILBERT'S SYNDROME (ICD-277.4) GERD  Past Surgical History: Appendectomy Laparotomy-exploratory for painful menses; Breast cyst aspirations X 2 Colonoscopy 2007 :negative, Dr Juanda Chance, due 2017  Family History: mother endometriosis,lung cancer( smoker) maternal grandmother stomach cancer maternal uncle liver cancer maternal uncle leukemia paternal great aunt breast and colon cancer father bypass surgery , CAD, intestinal carcinoid tumors paternal grandmother aneursym of thoracic aorta died  post surgery paternal grand father CVA  paternal side high cholesterol sister died at  age of 1 week  related to  congenital deformity ,? esophageal  atresia  Social History: Former Smoker, quit 2000 Alcohol use-yes: 1 light beer / day Production designer, theatre/television/film for [employer] Regular exercise-yes: gym 1X/week Does Patient Exercise:  yes  Review of Systems General:  Complains of fever; denies chills and sweats. Eyes:  Cyst OD monitored by Dr Hazle Quant. ENT:  Denies difficulty swallowing, hoarseness, nasal congestion, and sinus pressure; No facial pain , frontal headache, or purulence. CV:  Complains of palpitations; denies chest pain or discomfort, leg cramps with exertion, shortness of breath with exertion, swelling of feet, and swelling of hands. Resp:  Denies shortness of breath, sputum productive, and wheezing. GI:  Denies abdominal pain, bloody stools, dark tarry stools, and indigestion; No dyspepsia Ranitidine 150  two times a day . GU:  Denies discharge, dysuria, and hematuria. MS:  Complains of loss of strength; denies joint pain, joint redness, joint swelling, mid back pain, and thoracic pain; DDD causes LBP. Derm:  Denies changes in nail beds, dryness, hair loss, lesion(s), and rash. Neuro:  Denies numbness and tingling; Occasional sciatica RLE . Psych:  Complains of anxiety; denies depression; Job related stress. Endo:  Denies cold intolerance, excessive hunger, excessive thirst, excessive urination, and heat intolerance. Heme:  Denies abnormal bruising and bleeding. Allergy:  Denies itching eyes and sneezing.  Physical Exam  General:  well-nourished; alert,appropriate and cooperative throughout examination Head:  Normocephalic and atraumatic without obvious abnormalities.  Eyes:  No corneal or conjunctival inflammation noted.  Perrla. Funduscopic exam benign, without hemorrhages, exudates or papilledema.  Ears:  External ear exam shows  no significant lesions or deformities.  Otoscopic examination reveals clear canals, tympanic membranes are intact bilaterally without bulging, retraction, inflammation or discharge. Hearing is  grossly normal bilaterally. Nose:  External nasal examination shows no deformity or inflammation. Nasal mucosa are dry without lesions or exudates. Septum to R slightly Mouth:  Oral mucosa and oropharynx without lesions or exudates.  Teeth in good repair. Neck:  No deformities, masses, or tenderness noted. Lungs:  Normal respiratory effort, chest expands symmetrically. Lungs are clear to auscultation, no crackles or wheezes. Heart:  Normal rate and regular rhythm. S1 and S2 normal without gallop, murmur, click, rub. S4 with slurring Abdomen:  Bowel sounds positive,abdomen soft and non-tender without masses, organomegaly or hernias noted. Genitalia:  Dr Eda Paschal Msk:  No deformity or scoliosis noted of thoracic or lumbar spine.   Pulses:  R and L carotid,radial,dorsalis pedis and posterior tibial pulses are full and equal bilaterally Extremities:  No clubbing, cyanosis, edema, or deformity noted with normal full range of motion of all joints.   Crepitus L >R. Neurologic:  alert & oriented X3, strength normal in all extremities, and DTRs symmetrical and normal.   Skin:  Intact without suspicious lesions or rashes Cervical Nodes:  No lymphadenopathy noted Axillary Nodes:  No palpable lymphadenopathy Psych:  memory intact for recent and remote, normally interactive, and good eye contact.     Impression & Recommendations:  Problem # 1:  PREVENTIVE HEALTH CARE (ICD-V70.0)  Orders: EKG w/ Interpretation (93000) TLB-BMP (Basic Metabolic Panel-BMET) (80048-METABOL) TLB-CBC Platelet - w/Differential (85025-CBCD) TLB-Hepatic/Liver Function Pnl (80076-HEPATIC) TLB-TSH (Thyroid Stimulating Hormone) (84443-TSH)  Problem # 2:  BRONCHITIS-ACUTE (ICD-466.0)  cough, fever  Her updated medication list for this problem includes:    Azithromycin 250 Mg Tabs (Azithromycin) .[name]... As per pack  Orders: TLB-CBC Platelet - w/Differential (85025-CBCD)  Problem # 3:  GERD (ICD-530.81) controlled Her  updated medication list for this problem includes:    Ranitidine Hcl 150 Mg Tabs (Ranitidine hcl) .[name]... 1 two times a day pre meals as needed  Problem # 4:  HYPERLIPIDEMIA NEC/NOS (ICD-272.4)  The following medications were removed from the medication list:    Lovaza 1 Gm Caps (Omega-3-acid ethyl esters) .[name]... 2 pills bid Her updated medication list for this problem includes:    Crestor 20 Mg Tabs (Rosuvastatin calcium) .[name]... 1 by mouth once daily; she does not want to increase dose; intolerant to Lovaza & Zetia  Orders: EKG w/ Interpretation (93000) Cardiology Referral (Cardiology) TLB-Hepatic/Liver Function Pnl (80076-HEPATIC)  Problem # 5:  HYPERGLYCEMIA, FASTING (ICD-790.29) A1c in NON Diabetes range  Problem # 6:  NONSPECIFIC ABNORMAL ELECTROCARDIOGRAM (ICD-794.31)  Orders: EKG w/ Interpretation (93000) Cardiology Referral (Cardiology): essentially stable to minimal progression  Complete Medication List: 1)  Crestor 20 Mg Tabs (Rosuvastatin calcium) .[name].. 1 by mouth once daily 2)  Ibuprofen Prn  3)  Ranitidine Hcl 150 Mg Tabs (Ranitidine hcl) .[name].. 1 two times a day pre meals as needed 4)  Vitamin C 500 Mg Tabs (Ascorbic acid) .[name].. 1 by mouth once daily 5)  Echinacea 400 Mg Caps (Echinacea) .... 2-3 x weekly 6)  Azithromycin 250 Mg Tabs (Azithromycin) .... As per pack  Patient Instructions: 1)  Take 650-1000mg  of Tylenol every 4-6 hours as needed for relief of pain or comfort of fever AVOID taking more than 4000mg   in a 24 hour period (can cause liver damage in higher doses) OR  2)  take 400-600mg  of Ibuprofen (Advil, Motrin) with food every 4-6 hours as needed  for relief of pain or comfort of fever. Prescriptions: AZITHROMYCIN 250 MG TABS (AZITHROMYCIN) as per pack  #1 x 0   Entered and Authorized by:   Marga Melnick MD   Signed by:   Marga Melnick MD on 07/14/2009   Method used:   Print then Give to Patient   RxID:   [id number]

## 2010-06-06 NOTE — Progress Notes (Signed)
Summary: Chest xray results  Phone Note Outgoing Call Call back at [phone]   Call placed by: Shonna Chock,  August 15, 2009 5:22 PM Details for Reason: Chest Xray results Summary of Call: Left message on VM informing patient of results below  Excellent report; no active disease. Levester Fresh  August 15, 2009 5:23 PM

## 2010-06-06 NOTE — Progress Notes (Signed)
Summary: Refill Request  Phone Note Refill Request Call back at Home Phone [phone] Message from:  Patient  Refills Requested: Medication #1:  CRESTOR 40 MG TABS one tablet daily [facility] [address], patient lost paper RX   Method Requested: Fax to Local Pharmacy Initial call taken by: Shonna Chock,  October 21, 2009 4:10 PM    Prescriptions: CRESTOR 40 MG TABS (ROSUVASTATIN CALCIUM) one tablet daily  #30 x 11   Entered by:   Shonna Chock   Authorized by:   Marga Melnick MD   Signed by:   Shonna Chock on 10/21/2009   Method used:   Electronically to        [facility] [address] (retail)       [phone]       [place], [place]  [address]       Ph: [phone]       Fax: [phone]   RxID:   [id number]

## 2010-06-06 NOTE — Progress Notes (Signed)
Summary: Chest pain/left arm numbness  Phone Note Call from Patient   Summary of Call: Patient called noting that last night she had indigestion and a little tingling in her chest with a little numbness in her left arm. Patient is having the symptoms "a little" right now. She denies chest pressure, nausea, and chills. She notes that she has dizziness all the time but does not have it today. She notes that she does have a cardiologist.   I made the patient aware that I could bring her in for ov/EKG and if abnormal we would call from ambulance transport to the ER. She will call her cardiolost to discuss/get their recommendation. She is aware that I will call to check on her either late today or tomorrow. Initial call taken by: Lucious Groves CMA,  April 13, 2010 11:30 AM  Follow-up for Phone Call        to ER if recurrent or progressive Follow-up by: Marga Melnick MD,  April 13, 2010 4:47 PM  Additional Follow-up for Phone Call Additional follow up Details #1::        Called to check on the patient, left message on machine to call back to office. Lucious Groves CMA  April 14, 2010 9:46 AM     Additional Follow-up for Phone Call Additional follow up Details #2::    FOLLOW VIA OTHER PHONE NOTE CREATED BY OFFICE STAFF. Lucious Groves CMA  April 14, 2010 9:55 AM

## 2010-06-06 NOTE — Consult Note (Signed)
Summary: [facility]   Imported By: Lanelle Bal 08/05/2009 10:42:25  _____________________________________________________________________  External Attachment:    Type:   Image     Comment:   External Document

## 2010-06-06 NOTE — Progress Notes (Signed)
Summary: Problem List Brought by Patient  Problem List Brought by Patient   Imported By: Lanelle Bal 10/07/2009 09:26:40  _____________________________________________________________________  External Attachment:    Type:   Image     Comment:   External Document

## 2010-06-08 NOTE — Assessment & Plan Note (Signed)
Summary: hosp followup--chest pain, numbness lft arm///sph   Vital Signs:  Patient profile:   65 year old female Weight:      171 pounds BMI:     29.46 Temp:     98.6 degrees F oral Pulse rate:   64 / minute Resp:     15 per minute BP sitting:   124 / 70  (left arm) Cuff size:   large  Vitals Entered By: Shonna Chock CMA (April 17, 2010 11:28 AM) CC: Hospital Follow-Up   Primary Care Provider:  Marga Melnick MD  CC:  Hospital Follow-Up.  History of Present Illness:      This is a 65 year old female who presents  for F/U of  Chest pain for which she was seen in ER 12/8. ER record reviewed.  Glucose 102 & total bilirubin was 1.6. EKG revealed improved ST-T changes vs. EKG here 07/14/2009.ER record copy given to her.  The patient reports resting chest pain and dizziness (? related to sinus pressure & hot flashes). CT  head scan in ER  was negative . She  denies associated  nausea, vomiting, diaphoresis, shortness of breath, and palpitations.  The pain is described as intermittent and dull.  The pain is located in the left anterior chest.  The pain radiates to the left arm and back.  Episodes of chest pain last  a few seconds.  The pain is relieved or improved with eructation.  She had  increased stool  frequency ( 3 in < 24 hrs ) 24 hrs pror to ER visit. She has restarted Ranitidine  as of 12/9 with resolution of symptoms    Current Medications (verified): 1)  Crestor 40 Mg Tabs (Rosuvastatin Calcium) .... One Tablet Daily 2)  Ibuprofen Prn 3)  Ranitidine Hcl 150 Mg Tabs (Ranitidine Hcl) .[name].. 1 Two Times A Day Pre Meals As Needed 4)  Vitamin C 500 Mg Tabs (Ascorbic Acid) .[name].. 1 By Mouth Once Daily 5)  Echinacea 400 Mg Caps (Echinacea) .... 2-3 X Weekly 6)  Px Enteric Aspirin 81 Mg Tbec (Aspirin) .... Take 2 Asa Daily  Allergies: 1)  ! Tylenol 2)  ! Prednisone 3)  ! * Fish Oil 4)  * Novacaine 5)  Pravachol  Review of Systems ENT:  Denies difficulty swallowing and  hoarseness. GI:  Denies abdominal pain, bloody stools, dark tarry stools, and indigestion; No dysphagia.  Physical Exam  General:  well-nourished,in no acute distress; alert,appropriate and cooperative throughout examination Eyes:  No corneal or conjunctival inflammation noted. EOMI. Perrla.No nystagmus Ears:  External ear exam shows no significant lesions or deformities.  Otoscopic examination reveals  wax bilaterally. Hearing is grossly normal bilaterally. Nose:  External nasal examination shows no deformity or inflammation. Nasal mucosa are  dry without lesions or exudates. Mouth:  Oral mucosa and oropharynx without lesions or exudates.  Teeth in good repair.No pharyngeal erythema.   Lungs:  Normal respiratory effort, chest expands symmetrically. Lungs are clear to auscultation, no crackles or wheezes. Heart:  Normal rate and regular rhythm. S1 and S2 normal without gallop, murmur, click, rub. S4 with slurring Abdomen:  Bowel sounds positive,abdomen soft and non-tender without masses, organomegaly or hernias noted. Pulses:  R and L carotid,radial,dorsalis pedis and posterior tibial pulses are full and equal bilaterally Extremities:  No clubbing, cyanosis, edema. Homan's negative Neurologic:  alert & oriented X3.   Skin:  Intact without suspicious lesions or rashes Cervical Nodes:  No lymphadenopathy noted Axillary Nodes:  No palpable  lymphadenopathy Psych:  memory intact for recent and remote, normally interactive, and good eye contact.     Impression & Recommendations:  Problem # 1:  CHEST PAIN (ICD-786.50) probably due to #2  Problem # 2:  GERD (ICD-530.81) recent apparent exacerbation Her updated medication list for this problem includes:    Ranitidine Hcl 150 Mg Tabs (Ranitidine hcl) .[name]... 1 two times a day pre meals as needed  Problem # 3:  NONSPECIFIC ABNORMAL ELECTROCARDIOGRAM (ICD-794.31) improved NS ST-T changes on ER EKG  Complete Medication List: 1)  Crestor 40 Mg  Tabs (Rosuvastatin calcium) .... One tablet daily 2)  Ibuprofen Prn  3)  Ranitidine Hcl 150 Mg Tabs (Ranitidine hcl) .[name].. 1 two times a day pre meals as needed 4)  Vitamin C 500 Mg Tabs (Ascorbic acid) .[name].. 1 by mouth once daily 5)  Echinacea 400 Mg Caps (Echinacea) .... 2-3 x weekly 6)  Px Enteric Aspirin 81 Mg Tbec (Aspirin) .... Take 2 asa daily  Patient Instructions: 1)  Continue Ranitidine two times a day for @ least 8 weeks , then two times a day as needed  for recurrence of symptoms. 2)  Avoid foods high in acid (tomatoes, citrus juices, spicy foods). Avoid eating within two hours of lying down or before exercising. Do not over eat; try smaller more frequent meals. Elevate head of bed twelve inches when sleeping. Align (Probiotic ) once daily as needed for bowel changes Prescriptions: RANITIDINE HCL 150 MG TABS (RANITIDINE HCL) 1 two times a day pre meals as needed  #180 x 1   Entered and Authorized by:   Marga Melnick MD   Signed by:   Marga Melnick MD on 04/17/2010   Method used:   Print then Give to Patient   RxID:   [id number]    Orders Added: 1)  Est. Patient Level IV [56213]

## 2010-06-17 ENCOUNTER — Encounter: Payer: Self-pay | Admitting: Internal Medicine

## 2010-06-30 ENCOUNTER — Encounter: Payer: Self-pay | Admitting: Obstetrics and Gynecology

## 2010-07-05 ENCOUNTER — Other Ambulatory Visit: Payer: Self-pay | Admitting: Obstetrics and Gynecology

## 2010-07-05 ENCOUNTER — Other Ambulatory Visit (HOSPITAL_COMMUNITY)
Admission: RE | Admit: 2010-07-05 | Discharge: 2010-07-05 | Disposition: A | Discharge status: Home / Self Care | Payer: BC Managed Care – PPO | Source: Ambulatory Visit | Attending: Obstetrics and Gynecology | Admitting: Obstetrics and Gynecology

## 2010-07-05 ENCOUNTER — Encounter (INDEPENDENT_AMBULATORY_CARE_PROVIDER_SITE_OTHER): Payer: BC Managed Care – PPO | Admitting: Obstetrics and Gynecology

## 2010-07-05 DIAGNOSIS — Z01419 Encounter for gynecological examination (general) (routine) without abnormal findings: Secondary | ICD-10-CM

## 2010-07-05 DIAGNOSIS — Z124 Encounter for screening for malignant neoplasm of cervix: Secondary | ICD-10-CM | POA: Insufficient documentation

## 2010-07-18 LAB — CBC
HCT: 43.2 % (ref 36.0–46.0)
Hemoglobin: 14.2 g/dL (ref 12.0–15.0)
MCV: 88.3 fL (ref 78.0–100.0)
RDW: 13.5 % (ref 11.5–15.5)
WBC: 8.8 10*3/uL (ref 4.0–10.5)

## 2010-07-18 LAB — COMPREHENSIVE METABOLIC PANEL
ALT: 35 U/L (ref 0–35)
Alkaline Phosphatase: 60 U/L (ref 39–117)
BUN: 11 mg/dL (ref 6–23)
CO2: 25 mEq/L (ref 19–32)
GFR calc non Af Amer: >60 mL/min (ref >60)
Glucose, Bld: 102 mg/dL — ABNORMAL HIGH (ref 70–99)
Potassium: 3.7 mEq/L (ref 3.5–5.1)
Sodium: 142 mEq/L (ref 135–145)
Total Bilirubin: 1.6 mg/dL — ABNORMAL HIGH (ref 0.3–1.2)
Total Protein: 7.8 g/dL (ref 6.0–8.3)

## 2010-07-18 LAB — POCT CARDIAC MARKERS
CKMB, poc: <1 ng/mL — ABNORMAL LOW (ref 1.0–8.0)
Myoglobin, poc: 41.6 ng/mL (ref 12–200)

## 2010-07-18 LAB — DIFFERENTIAL
Basophils Absolute: 0 10*3/uL (ref 0.0–0.1)
Basophils Relative: 0 % (ref 0–1)
Eosinophils Absolute: 0.2 10*3/uL (ref 0.0–0.7)
Neutro Abs: 6.1 10*3/uL (ref 1.7–7.7)
Neutrophils Relative %: 70 % (ref 43–77)

## 2010-08-02 ENCOUNTER — Ambulatory Visit (INDEPENDENT_AMBULATORY_CARE_PROVIDER_SITE_OTHER): Payer: BC Managed Care – PPO | Admitting: Internal Medicine

## 2010-08-02 ENCOUNTER — Telehealth: Payer: Self-pay

## 2010-08-02 ENCOUNTER — Encounter: Payer: Self-pay | Admitting: Internal Medicine

## 2010-08-02 DIAGNOSIS — F411 Generalized anxiety disorder: Secondary | ICD-10-CM

## 2010-08-02 DIAGNOSIS — M79609 Pain in unspecified limb: Secondary | ICD-10-CM

## 2010-08-02 DIAGNOSIS — E785 Hyperlipidemia, unspecified: Secondary | ICD-10-CM

## 2010-08-02 DIAGNOSIS — R5383 Other fatigue: Secondary | ICD-10-CM

## 2010-08-02 DIAGNOSIS — Z0000 Encounter for general adult medical examination without abnormal findings: Secondary | ICD-10-CM

## 2010-08-02 DIAGNOSIS — R5381 Other malaise: Secondary | ICD-10-CM

## 2010-08-02 DIAGNOSIS — F419 Anxiety disorder, unspecified: Secondary | ICD-10-CM

## 2010-08-02 LAB — CBC WITH DIFFERENTIAL/PLATELET
Basophils Absolute: 0 10*3/uL (ref 0.0–0.1)
Basophils Relative: 0.4 % (ref 0.0–3.0)
Eosinophils Absolute: 0.1 10*3/uL (ref 0.0–0.7)
Lymphocytes Relative: 20.3 % (ref 12.0–46.0)
MCHC: 33.6 g/dL (ref 30.0–36.0)
MCV: 86.7 fl (ref 78.0–100.0)
Monocytes Absolute: 0.4 10*3/uL (ref 0.1–1.0)
Neutrophils Relative %: 71.7 % (ref 43.0–77.0)
RBC: 4.76 Mil/uL (ref 3.87–5.11)
RDW: 14 % (ref 11.5–14.6)

## 2010-08-02 LAB — BASIC METABOLIC PANEL
BUN: 11 mg/dL (ref 6–23)
Calcium: 9.5 mg/dL (ref 8.4–10.5)
Creatinine, Ser: 0.6 mg/dL (ref 0.4–1.2)
Potassium: 4.4 mEq/L (ref 3.5–5.1)
Sodium: 143 mEq/L (ref 135–145)

## 2010-08-02 LAB — LIPID PANEL
LDL Cholesterol: 121 mg/dL — ABNORMAL HIGH (ref 0–99)
Total CHOL/HDL Ratio: 4
Triglycerides: 121 mg/dL (ref 0.0–149.0)

## 2010-08-02 LAB — TSH: TSH: 1.95 u[IU]/mL (ref 0.35–5.50)

## 2010-08-02 MED ORDER — TETANUS-DIPHTH-ACELL PERTUSSIS 5-2.5-18.5 LF-MCG/0.5 IM SUSP
0.5000 mL | Freq: Once | INTRAMUSCULAR | Status: AC
  Administered 2010-08-02: 0.5 mL via INTRAMUSCULAR

## 2010-08-02 MED ORDER — CITALOPRAM HYDROBROMIDE 20 MG PO TABS: 20.0000 mg | ORAL_TABLET | Freq: Every day | ORAL | Status: AC

## 2010-08-02 NOTE — Telephone Encounter (Signed)
I reviewed the nine concerns listed in your  note. Many of these  I perceived were of a chronic or recurrent  nature and have been evaluated in the past by GI & Cardiology  subspecialists .Unfortunately  definitive diagnoses have not been made.Most importantly, serious health threatening conditions have been ruled out.                                                               Your observations suggest milk intolerance as cause of the gas symptoms. I believe that is indeed the  most likely etiology.                                                                                                                                                                                      If palpitations are related to hot flashes then treatment such as using clonidine in low doses may be beneficial in treating both. Raising serotonin also should help with these menopausal related  symptoms as Serotonin drops post menopausally. The main benefit of  raising  the neurotransmitter serotonin would be to reduce symptoms caused by deficiency of this neurochemical: anxiety, irritability, & difficulty triaging. If serotonin is severely decreased, panic attacks or depression can occur. The only way to tell if the medication is beneficial  is to assess symptom response  after a 6-12 week treatment trial. The Citalopram has no addictive potential.    The most appropriate approach  would be to assess these  Nine concerns after  evaluating the results of the  extensive laboratory studies which are pending & the medication trial discussed above.     The screening tests for deep venous thrombosis was negative. The leg symptoms are undoubtedly due to to muscle injury climbing mountains last week while on vacation.    Please review the complete physical note and make additions or corrections as needed. Please review the lab results with  comments  which will be sent to you upon their  return. Additional recommendations or further  evaluation can be considered after those studies have been reviewed in detail. [employer]

## 2010-08-02 NOTE — Patient Instructions (Signed)
A special screening test will be done to rule out a deep venous thrombosis. Clinically your findings suggest muscle strain. Hot water bottle or topical agent such as Zostrix cream would be recommended as needed. Please report chest pain, coughing up blood, or progressive shortness of breath.

## 2010-08-02 NOTE — Progress Notes (Signed)
Subjective:    Patient ID: Lindsey Barrett, female    DOB: May 20, 1945, 65 y.o.   MRN: [id number]  HPI she is here for an annual exam; she is concerned about a knot in the left leg which has been present for one week and is uncomfortable.  Significantly last week she was on vacation wearing different shoes and walking extensively, often uphill.  Past medical history includes dyslipidemia; degenerative disc disease; abnormal EKG (nonspecific ST-T changes) with normal stress test in 2007; GERD; Gilbert syndrome; and varicose veins. Her advanced cholesterol testing in 2004 revealed an LDL of 182 with 2496 total particles and 1644 small dense particles. HDL was 52 and triglycerides were 154.LDL goal = < 100.  Surgical history  includes appendectomy in her preteens; laparoscopic exam in 1970s for pain; D&C in the 1970s and oral surgery (wisdom tooth extraction). She's had 2 breast cyst on the right aspirated and one on the left. She also apparently had a needle biopsy in 2007 by Dr. Yolanda Bonine.  She  had sigmoidoscopy in 2001 also remotely 1996. Colonoscopy in 2007 revealed diverticulosis. Family history is complicated and complex. There is a paternal family history of dyslipidemia. Her mother had lung cancer; she was a smoker. Maternal grandmother had stomach cancer; maternal uncle liver cancer; maternal uncle leukemia; maternal great aunt breast and colon cancer; father coronary disease with bypass at age 28. He died of intestinal carcinoid. Maternal grandmother had an aneurysm of  the heart following surgery. Paternal grandfather had stroke. Sister died at age of one week of congenital deformity.  She quit smoking in 2000. 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 She is exercising 1-2 times per week. She has one cup of caffeinated coffee per day. Other beverages  Are decaffeinated.      Review of Systems review of systems was  completed in toto.  She does have persistent fatigue without other constitutional symptoms.  She experiences hot flashes with associated tachycardia.  She's lost 4 pounds with the dietary changes listed above.  She specifically denies changes in her hair skin or nails.  She notices some tingling discomfort in the left calf. She denies chest pain, hemoptysis, or dyspnea.  She does have intermittent lightheadedness and near-syncope without any specific neurologic or cardiac triggers    Objective:   Physical Exam  on exam she's in no acute distress. Skin is warm and dry.  Fundal exam reveals no vascular changes.  A slight septal dislocation is noted. Nares are patent.  Oropharynx is normal;dental hygiene is good.  Otic exam is normal  Tyroid was normal to palpation without nodules.  Chest is clear without rhonchi, rales, increased work of breathing, or rubs.  Cardiac rhythm is normal with no murmurs or gallops.  Abdomen is nontender with no organomegaly or masses.  Range of motion of extremities is normal. She has mild crepitus of knees. She has mild DIP osteoarthritic changes of hands.  There is no cyanosis, clubbing or edema. Homans sign is negative in the left lower extremity.  Deep tendon reflexes are normal. She is oriented x3. Strength is excellent.          Assessment & Plan:  #1 comprehensive physical reveals no acute processes.  #2 calf discomfort; clinically there is no evidence of deep venous thrombosis  #3 fatigue, persistent.  #4 dyslipidemia  Plan: #1 [place] heart panel to assess long-term cardiac risk  #2 routine  physical labs  #3 d-dimer as screen for DVT.

## 2010-08-02 NOTE — Telephone Encounter (Signed)
Patient states she feels like her office visit was incomplete. Patient was here for a CPX and had 9 additional concerns (patient left paper on my desk) 1.)Left leg concerns (This was addressed) 2.) Aches and pains 3.) Dizziness 4.) GERD symptoms-gas and belching 5.) Ranitidine- causing nausea 6.) Fatigue 7.) Anxiety about health  8.) Hot Flashes and heart racing  9.) Needs more info about serotonin  I informed patient that a CPX is a wellness visit and its hard to address 9 other concern with a CPX visit, Patient aware I will send this message to Dr.Hopper and see if any of these concerns can be handled through a phone call . FYI: Patient with pending appointment on 08/23/2010 to follow-up on labs done today

## 2010-08-03 ENCOUNTER — Encounter: Payer: Self-pay | Admitting: Internal Medicine

## 2010-08-03 NOTE — Telephone Encounter (Signed)
I spoke with patient and all information was ok'd, patient aware I will mail her all documents (labs and OV) when completed.

## 2010-08-09 ENCOUNTER — Telehealth: Payer: Self-pay

## 2010-08-09 NOTE — Telephone Encounter (Signed)
I left patient a message informing patient to call the office and schedule a fasting lab appointment for Hepatic panel/ 995.20 (this was suppose to be done at last lab draw)-NO CHARGE FOR VENIPUNCTURE

## 2010-08-15 ENCOUNTER — Other Ambulatory Visit (INDEPENDENT_AMBULATORY_CARE_PROVIDER_SITE_OTHER): Payer: BC Managed Care – PPO

## 2010-08-15 DIAGNOSIS — T887XXA Unspecified adverse effect of drug or medicament, initial encounter: Secondary | ICD-10-CM

## 2010-08-15 LAB — HEPATIC FUNCTION PANEL
ALT: 24 U/L (ref 0–35)
Total Bilirubin: 1.2 mg/dL (ref 0.3–1.2)

## 2010-08-21 ENCOUNTER — Encounter: Payer: Self-pay | Admitting: Internal Medicine

## 2010-08-23 ENCOUNTER — Encounter: Payer: Self-pay | Admitting: Internal Medicine

## 2010-08-23 ENCOUNTER — Ambulatory Visit (INDEPENDENT_AMBULATORY_CARE_PROVIDER_SITE_OTHER): Payer: BC Managed Care – PPO | Admitting: Internal Medicine

## 2010-08-23 DIAGNOSIS — E785 Hyperlipidemia, unspecified: Secondary | ICD-10-CM

## 2010-08-23 DIAGNOSIS — R197 Diarrhea, unspecified: Secondary | ICD-10-CM

## 2010-08-23 NOTE — Patient Instructions (Signed)
As you've chosen therapeutic life changes rather than adding the Zetia; I recommend you check a fasting routine cholesterol panel , not an "advanced cholesterol panel"  after 12 weeks of tese interventions.(272.4).  Report warnings signs of fever, abdominal pain or bloody diarrhea.

## 2010-08-23 NOTE — Progress Notes (Signed)
Subjective:    Patient ID: Lindsey Barrett, female    DOB: 26-Apr-1946, 65 y.o.   MRN: [id number]  HPI DIARRHEA Onset: this am @7am    Description: projectile & watery   Number of stools per day: ? 6-8  Previous treatment: no  Vomiting: no  Abdominal Pain: no  Lightheadedness: no  Recent travel history: no    Sick contacts: no    Suspicious food or water: yes, she had salads with curry , mayonnaise 04/17 @ lunch  Fever: no  Bloody stools: no  Recent antibiotics: no  Immuncompromised: no   Dyslipidemia assessment: Lab results  review:                                                                                  Prior advanced Lipid testing : NMR Lipoprofile 2011: LDL 164( 2754/ 1680), HDL 53, TG 118 / [facility] reviewed; only significant risk = LDL > 100                          Family history of premature CAD/ MI: no                                                                               Nutrition: heart healthy, high fiber   Exercise: no  Diabetes : no   Smoking : quit 2000   HTN: no     Weight :  Down 4#  With diet change           ROS: fatigue: some due to sleep deprivation ; chest pain : no ;claudication: no; palpitations: occasional; abd pain/bowel changes: see above ; myalgias:no;  syncope : no ; memory loss: no;skin changes: hair thinner      Review of Systems     Objective:   Physical Exam  Gen.: Healthy and well-nourished in appearance. Alert, appropriate and cooperative throughout exam. Eyes: No corneal or conjunctival inflammation noted. Pupils equal round reactive to light and accommodation.No icterus Mouth: Oral mucosa and oropharynx reveal no lesions or exudates. Teeth in good repair.No erythema. Lungs: Normal respiratory effort; chest expands symmetrically. Lungs are clear to auscultation without rales, wheezes, or increased work of breathing. Heart: Slow  rate and rhythm. Normal S1 and S2. No gallop, click, or rub.  No  murmur. Abdomen: Bowel sounds  normal; abdomen soft and nontender. No masses, organomegaly or hernias noted. Vascular: Carotid, radial artery, dorsalis pedis and dorsalis posterior tibial pulses are full and equal. No bruits present. Neurologic: Alert and oriented x3.     Skin: Intact without suspicious lesions or rashes.No jaundice.5X3 mm nevus R abdomen Lymph: No cervical, axillary, or inguinal lymphadenopathy present. Psych: Mood and affect are normal. Normally interactive  Assessment & Plan:  #1 diarrhea; this appears to be diet related rather than infectious or medication related  #2 dyslipidemia. Review of both advance cholesterol testing indicates significant long-term cardiovascular risk if the LDL or bad cholesterol is above 100.  Plan: #1 a bland diet is recommended until the diarrhea resolves. Align once daily may be beneficial in correcting the gut flora.  #2 consideration should be given to  Adding Zetia. Although there is no increased absorption this may be synergistic with the Crestor at 40 mg daily. I encouraged her to see Dr.McLean and discuss this if she has any concerns. After changes in diet, exercise and medications a routine is not "advanced" cholesterol panel to be checked after 10-12 weeks.

## 2010-08-23 NOTE — Assessment & Plan Note (Signed)
NMR Lipoprofile 2011: LDL 164(2754/ 1680), HDL 53,TG 118. LDL goal =< 100, ideally < 70.

## 2010-10-06 HISTORY — PX: UPPER GI ENDOSCOPY: SHX6162

## 2010-10-19 ENCOUNTER — Telehealth: Payer: Self-pay

## 2010-10-19 ENCOUNTER — Other Ambulatory Visit: Payer: Self-pay | Admitting: Internal Medicine

## 2010-10-19 DIAGNOSIS — R197 Diarrhea, unspecified: Secondary | ICD-10-CM

## 2010-10-19 NOTE — Telephone Encounter (Signed)
Pt called says that diarrhea still hasn't resolved would like to get referral to GI has seen Dr. Juanda Chance in the past for colonoscopies.

## 2010-10-20 ENCOUNTER — Encounter: Payer: Self-pay | Admitting: Internal Medicine

## 2010-10-20 ENCOUNTER — Telehealth: Payer: Self-pay | Admitting: Internal Medicine

## 2010-10-20 NOTE — Telephone Encounter (Signed)
Spoke with [employer]. Patient scheduled to see Willette Cluster, NP on 10/27/10 at 2:00 PM.

## 2010-10-23 ENCOUNTER — Encounter: Payer: Self-pay | Admitting: Internal Medicine

## 2010-10-23 ENCOUNTER — Ambulatory Visit (INDEPENDENT_AMBULATORY_CARE_PROVIDER_SITE_OTHER): Payer: BC Managed Care – PPO | Admitting: Internal Medicine

## 2010-10-23 DIAGNOSIS — K3189 Other diseases of stomach and duodenum: Secondary | ICD-10-CM

## 2010-10-23 DIAGNOSIS — R1013 Epigastric pain: Secondary | ICD-10-CM

## 2010-10-23 NOTE — Patient Instructions (Signed)
You have been scheduled for an endoscopy. Please follow written instructions given to you at your visit today. You have been scheduled for an abdominal ultrasound at [facility] on Tuesday 10/31/10 @830  am. Arrive at 8:15 am for registration. Please make certain not to have anything to eat or drink 6 hours prior to your test. We have given you samples of Aciphex to take once daily until your endoscopy. We will decide if you need this medication long term at the time of your procedure.

## 2010-10-23 NOTE — Progress Notes (Signed)
Lindsey Barrett 19-Nov-1945 MRN [id number]        History of Present Illness:  This is a 65 year old white female with dyspepsia and postprandial bloating. She had an episode of epigastric pain and chest pain earlier this year and was evaluated at the emergency room. Cardiac causes have been ruled out. She has had bloating, belching and epigastric discomfort often prior to going to bed. She denies dysphagia or odynophagia. Ranitidine 150 mg twice a day improves her symptoms but does not eliminate them. We have done a screening colonoscopy in 2007 with findings of moderate diverticulosis of the left colon. She had 2 prior flexible sigmoidoscopies in 1996 in 2001. There is no family history of gallbladder disease. Her younger sister died of esophageal atresia as an infant.   Past Medical History  Diagnosis Date  . Fasting hyperglycemia   . DDD (degenerative disc disease)   . Gilbert syndrome   . Breast cyst   . Hyperlipidemia   . Anxiety   . GERD (gastroesophageal reflux disease)   . Diverticulosis    Past Surgical History  Procedure Date  . Appendectomy   . Exploratory laparotomy 1970's  . Breast cyst aspiration     x2 right x1 left  . Wisdom tooth extraction   . Dilation and curettage of uterus     reports that she quit smoking about 12 years ago. Her smoking use included Cigarettes. She has never used smokeless tobacco. She reports that she drinks about .6 ounces of alcohol per week. She reports that she does not use illicit drugs. family history includes Aneurysm in an unspecified family member; Breast cancer in her paternal aunt; Cancer in her father; Colon cancer in her paternal aunt; Coronary artery disease in her father; Endometriosis in her mother; Leukemia in her maternal uncle; Liver cancer in her maternal uncle; Lung cancer in her mother; Stomach cancer in her maternal grandmother; Stroke in her paternal grandfather; and Sudden death in her sister. Allergies  Allergen  Reactions  . Acetaminophen   . Fish Oil   . Pravastatin Sodium   . Prednisone   . Procaine Hcl Palpitations    tachicardia        Review of Systems:  The remainder of the 10  point ROS is negative except as outlined in H&P   Physical Exam: General appearance  Well developed, in no distress. Eyes- non icteric. HEENT nontraumatic, normocephalic. Mouth no lesions, tongue papillated, no cheilosis. Neck supple without adenopathy, thyroid not enlarged, no carotid bruits, no JVD. Lungs Clear to auscultation bilaterally. Cor normal S1 normal S2, regular rhythm , no murmur,  quiet precordium. Abdomen soft nontender abdomen with normal active bowel sounds. No tenderness. Liver edge at costal margin. No CVA tenderness. Rectal: Soft Hemoccult negative stool. Extremities no pedal edema. Skin no lesions. Neurological alert and oriented x 3. Psychological normal mood and affect.  Assessment and Plan:  Problem #1 Dyspepsia, bloating and episodic epigastric pain are partially relieved with ranitidine. Considerations include hiatal hernia, gastritis, H. pylori gastropathy, aspirin-induced gastropathy or biliary dysfunction. We will proceed with an upper abdominal ultrasound and upper endoscopy with appropriate biopsies. On a trial basis, we will start AcipHex 20 mg daily in place of ranitidine. She is up-to-date on her colonoscopy.  10/23/2010 [name]

## 2010-10-25 ENCOUNTER — Other Ambulatory Visit: Payer: Self-pay | Admitting: Internal Medicine

## 2010-10-25 ENCOUNTER — Ambulatory Visit (AMBULATORY_SURGERY_CENTER): Payer: BC Managed Care – PPO | Admitting: Internal Medicine

## 2010-10-25 ENCOUNTER — Encounter: Payer: Self-pay | Admitting: Internal Medicine

## 2010-10-25 DIAGNOSIS — K228 Other specified diseases of esophagus: Secondary | ICD-10-CM

## 2010-10-25 DIAGNOSIS — K219 Gastro-esophageal reflux disease without esophagitis: Secondary | ICD-10-CM

## 2010-10-25 DIAGNOSIS — K2289 Other specified disease of esophagus: Secondary | ICD-10-CM

## 2010-10-25 DIAGNOSIS — K3189 Other diseases of stomach and duodenum: Secondary | ICD-10-CM

## 2010-10-25 DIAGNOSIS — R1310 Dysphagia, unspecified: Secondary | ICD-10-CM

## 2010-10-25 DIAGNOSIS — D131 Benign neoplasm of stomach: Secondary | ICD-10-CM

## 2010-10-25 DIAGNOSIS — K2970 Gastritis, unspecified, without bleeding: Secondary | ICD-10-CM

## 2010-10-25 MED ORDER — SODIUM CHLORIDE 0.9 % IV SOLN: 500.0000 mL | INTRAVENOUS | Status: DC

## 2010-10-26 ENCOUNTER — Telehealth: Payer: Self-pay | Admitting: *Deleted

## 2010-10-26 NOTE — Telephone Encounter (Signed)
No answer at home number, message left for patient to call with any questions or concerns.

## 2010-10-27 ENCOUNTER — Ambulatory Visit: Payer: BC Managed Care – PPO | Admitting: Nurse Practitioner

## 2010-10-30 ENCOUNTER — Telehealth: Payer: Self-pay | Admitting: Internal Medicine

## 2010-10-30 DIAGNOSIS — K219 Gastro-esophageal reflux disease without esophagitis: Secondary | ICD-10-CM

## 2010-10-30 MED ORDER — RABEPRAZOLE SODIUM 20 MG PO TBEC: 20.0000 mg | DELAYED_RELEASE_TABLET | Freq: Every day | ORAL | Status: DC

## 2010-10-30 NOTE — Telephone Encounter (Signed)
OK to refer to a nutritionist: antireflux diet, also OK to refill Aciphex 20 mg, #30 3refills

## 2010-10-30 NOTE — Telephone Encounter (Signed)
Patient states that she is doing much better since taking Aciphex samples once daily. She states that she has had to cut out nearly everything she normally eats as she is trying to go by the antireflux sheet. Patient is very interested in going to a nutritionist who can help her know things that are okay to eat. Advised her that she may have to experiment with different foods to see which ones she can tolerate verses ones that she cannot. However, she would feel better seeing a nutritionist. Dr Juanda Chance, are you ok with me referring her to nutrition? Also,sent 1 month supply of Aciphex to patient's pharmacy until we get her biopsy results back since she has been doing well on it.

## 2010-10-31 ENCOUNTER — Telehealth: Payer: Self-pay

## 2010-10-31 ENCOUNTER — Other Ambulatory Visit: Payer: Self-pay | Admitting: Dermatology

## 2010-10-31 ENCOUNTER — Telehealth: Payer: Self-pay | Admitting: *Deleted

## 2010-10-31 ENCOUNTER — Ambulatory Visit (HOSPITAL_COMMUNITY)
Admission: RE | Admit: 2010-10-31 | Discharge: 2010-10-31 | Disposition: A | Discharge status: Home / Self Care | Payer: BC Managed Care – PPO | Source: Ambulatory Visit | Attending: Internal Medicine | Admitting: Internal Medicine

## 2010-10-31 ENCOUNTER — Encounter: Payer: Self-pay | Admitting: Internal Medicine

## 2010-10-31 DIAGNOSIS — R142 Eructation: Secondary | ICD-10-CM | POA: Insufficient documentation

## 2010-10-31 DIAGNOSIS — R1013 Epigastric pain: Secondary | ICD-10-CM

## 2010-10-31 DIAGNOSIS — K219 Gastro-esophageal reflux disease without esophagitis: Secondary | ICD-10-CM | POA: Insufficient documentation

## 2010-10-31 DIAGNOSIS — R143 Flatulence: Secondary | ICD-10-CM | POA: Insufficient documentation

## 2010-10-31 DIAGNOSIS — R141 Gas pain: Secondary | ICD-10-CM | POA: Insufficient documentation

## 2010-10-31 DIAGNOSIS — K3189 Other diseases of stomach and duodenum: Secondary | ICD-10-CM | POA: Insufficient documentation

## 2010-10-31 MED ORDER — RABEPRAZOLE SODIUM 20 MG PO TBEC: 20.0000 mg | DELAYED_RELEASE_TABLET | Freq: Every day | ORAL | Status: DC

## 2010-10-31 NOTE — Telephone Encounter (Signed)
Left message for pt regarding results per Dr. Juanda Chance.

## 2010-10-31 NOTE — Telephone Encounter (Signed)
Message copied by Michele Mcalpine on Tue Oct 31, 2010 11:19 AM ------      Message from: Hart Carwin      Created: Tue Oct 31, 2010 10:55 AM       Please call pt with normal  Ultrasound of the abdomen

## 2010-10-31 NOTE — Telephone Encounter (Signed)
Patient came into the office and requested samples of Aciphex until Prior Auth. Can be completed.  I gave her 6 boxes of Aciphex.  Samples given to patient    Lot#  [id number]                 Exp.  01/2012                Amount  6

## 2010-10-31 NOTE — Telephone Encounter (Signed)
Patient is advised to contact Nutrition later in the week to schedule her appt.  I have sent over the last office notes, demo, ins, and procedure.

## 2010-10-31 NOTE — Telephone Encounter (Signed)
I have spoken to patient. Her insurance company requires her to try 2 preferred PPI's (omeprazole, pantoprazole, Nexium) before they will approve the Aciphex. Patient states that she has not tried any PPI's other than ranitadine. I have advised patient that we will switch her to omeprazole 40 mg once daily for now and she should call us back if it does not work. Patient verbalizes understanding.

## 2010-10-31 NOTE — Telephone Encounter (Signed)
Left message for patient to call back.  Orders sent to nutrition management and Rx to her pharmacy

## 2010-11-01 MED ORDER — OMEPRAZOLE 40 MG PO CPDR: 40.0000 mg | DELAYED_RELEASE_CAPSULE | Freq: Every day | ORAL | Status: DC

## 2010-11-01 NOTE — Telephone Encounter (Signed)
Addended by: Richardson Chiquito on: 11/01/2010 08:06 AM   Modules accepted: Orders

## 2010-11-07 ENCOUNTER — Other Ambulatory Visit: Payer: Self-pay | Admitting: Internal Medicine

## 2010-11-14 ENCOUNTER — Telehealth: Payer: Self-pay | Admitting: Internal Medicine

## 2010-11-14 NOTE — Telephone Encounter (Signed)
Patient wanted to let Dr. Juanda Chance know that the Prilosec daily in AM is working well.Burping has stopped. States she had [employer] last night- [place] and cheese sandwich and [place] salad without onion. This AM she had and episode of   stomach rolling and watery diarrhea x 2 hours then it stopped. She reports this has happened to her before when eating salad but does not happen every time. She does have an appointment with nutritionist on 11/29/10.

## 2010-11-15 NOTE — Telephone Encounter (Signed)
Thank You for the report.

## 2010-11-21 ENCOUNTER — Other Ambulatory Visit: Payer: Self-pay | Admitting: Internal Medicine

## 2010-11-21 DIAGNOSIS — E785 Hyperlipidemia, unspecified: Secondary | ICD-10-CM

## 2010-11-22 ENCOUNTER — Other Ambulatory Visit: Payer: BC Managed Care – PPO

## 2010-11-23 ENCOUNTER — Other Ambulatory Visit: Payer: Self-pay | Admitting: Dermatology

## 2010-11-29 ENCOUNTER — Encounter: Payer: Self-pay | Admitting: *Deleted

## 2010-11-29 ENCOUNTER — Encounter: Payer: BC Managed Care – PPO | Attending: Internal Medicine | Admitting: *Deleted

## 2010-11-29 DIAGNOSIS — K219 Gastro-esophageal reflux disease without esophagitis: Secondary | ICD-10-CM | POA: Insufficient documentation

## 2010-11-29 DIAGNOSIS — Z713 Dietary counseling and surveillance: Secondary | ICD-10-CM | POA: Insufficient documentation

## 2010-11-29 NOTE — Progress Notes (Signed)
  Medical Nutrition Therapy:  Appt start time:  4:00p end time: 5:00p.   Assessment:  Primary concerns today: GERD. Pt with a 2 year h/o GERD sx including burping, fullness, and bloating after eating foods known to irritate the GI system. Had colonoscopy in June 2012 with biopsies reported as WNL (per pt).  Believes etiology to be long term NSAID use (ASA 325 mg for ~26 yrs) and daily ETOH use through adult life.  Reports that exercise relieves some sx as does the Prilosec she started 2 weeks ago.  Has tried small amounts of previous irritants (i.e. Broccoli and chocolate) and has been ok.  Milk is ok in cereal, but not as beverage.  Reports episode of clear diarrhea for 2 hours the morning after dinner at [facility] where she had same meal as usual, but for the first time since on GERD meds.  Pt wants to lose weight to help control BG, which she she reports is elevated with an A1c of 6.0%.    MEDICATIONS: Prilosec, Crestor, ASA 81 mg, TUMS EX (prn)   DIETARY INTAKE:  Usual eating pattern includes 3 meals and 0 snacks per day.   Foods tolerated: broccoli (small amt), black-eyed peas, fried egg, green beans Foods not tolerated or discontinued: Corn, lactose; ETOH, chocolate, soda, tomatoes (most of time), OJ  Recent Physical Activity: None at this time; used to walk 2 days/week  Estimated nutrient needs: 1100-1200 calories 130-145 g carbohydrates 75-80 g protein 30-35 g fat <10 g saturated fat  Progress Towards Goal(s):  NEW   Nutritional Diagnosis:  [place]-1.4 Altered GI function related to GERD as evidenced by symptoms of burping, bloating, and fullness with acidic, high fat foods that are known GI irritants.    Intervention/Goals:  Eat 5-6 small meals/day and avoid meal skipping.   Increase protein rich foods.  Choose more whole grains, lean protein, low-fat dairy, and fruits/non-starchy vegetables.   Aim for >30 min of physical activity daily.  Limit sugar-sweetened beverages,  concentrated sweets, and high fat, fried foods.  Wait 3 hours after eating before lying down.  Trial and error with foods to see if tolerated.  Try (1 per day) Glucerna Hunger Smart shake or Boost Glucose Control for snacks (NOT meal replacement).   Monitoring/Evaluation:  Dietary intake, exercise, GERD sx, and body weight prn.

## 2010-11-29 NOTE — Patient Instructions (Addendum)
Goals:  Eat 5-6 small meals/day and avoid meal skipping   Increase protein rich foods  Choose more whole grains, lean protein, low-fat dairy, and fruits/non-starchy vegetables.   Aim for >30 min of physical activity daily  Limit sugar-sweetened beverages, concentrated sweets, and high fat, fried foods.  Wait 3 hours after eating before lying down.  Trial and error with foods to see if tolerated.  Try (1 per day) Glucerna Hunger Smart shake or Boost Glucose Control for snacks.

## 2010-12-02 ENCOUNTER — Other Ambulatory Visit: Payer: Self-pay | Admitting: Internal Medicine

## 2010-12-04 NOTE — Telephone Encounter (Addendum)
Spoke w/ pt informed that she was due for labs after all had f/u visit on 08/23/10 that indicated labs where due but appt date was listed as clinical support instead of office visit which caused this to be overlooked. Pt ok'd information will be going out of town and will call to set up appt for labs again.

## 2010-12-21 ENCOUNTER — Other Ambulatory Visit: Payer: Self-pay | Admitting: Internal Medicine

## 2010-12-21 DIAGNOSIS — E785 Hyperlipidemia, unspecified: Secondary | ICD-10-CM

## 2010-12-21 DIAGNOSIS — T887XXA Unspecified adverse effect of drug or medicament, initial encounter: Secondary | ICD-10-CM

## 2010-12-22 ENCOUNTER — Other Ambulatory Visit (INDEPENDENT_AMBULATORY_CARE_PROVIDER_SITE_OTHER): Payer: BC Managed Care – PPO

## 2010-12-22 DIAGNOSIS — E785 Hyperlipidemia, unspecified: Secondary | ICD-10-CM

## 2010-12-22 DIAGNOSIS — T887XXA Unspecified adverse effect of drug or medicament, initial encounter: Secondary | ICD-10-CM

## 2010-12-22 LAB — HEPATIC FUNCTION PANEL
Bilirubin, Direct: 0.1 mg/dL (ref 0.0–0.3)
Total Protein: 7.5 g/dL (ref 6.0–8.3)

## 2010-12-22 LAB — LIPID PANEL
Cholesterol: 178 mg/dL (ref 0–200)
HDL: 48 mg/dL (ref >39.00)

## 2010-12-22 NOTE — Progress Notes (Signed)
Labs only

## 2011-01-01 ENCOUNTER — Telehealth: Payer: Self-pay | Admitting: Internal Medicine

## 2011-01-01 NOTE — Telephone Encounter (Signed)
Agree, see PCP for those complaints

## 2011-01-01 NOTE — Telephone Encounter (Signed)
Spoke with patient and she reports that she in on Omeprazole since June. It had been helping her until last week, she started with increased burping, heart racing then skipping beats, hot flashes and sweats. She had EGD on 10/25/10- reflux, last ov- 10/23/10- dyspepsia. Suggested patient call her PCP to evaluate the multiple symptoms.

## 2011-01-01 NOTE — Telephone Encounter (Signed)
Left a message for patient to call me. 

## 2011-01-02 ENCOUNTER — Ambulatory Visit (INDEPENDENT_AMBULATORY_CARE_PROVIDER_SITE_OTHER): Payer: BC Managed Care – PPO | Admitting: Internal Medicine

## 2011-01-02 ENCOUNTER — Encounter: Payer: Self-pay | Admitting: Internal Medicine

## 2011-01-02 DIAGNOSIS — K219 Gastro-esophageal reflux disease without esophagitis: Secondary | ICD-10-CM

## 2011-01-02 DIAGNOSIS — R000 Tachycardia, unspecified: Secondary | ICD-10-CM

## 2011-01-02 MED ORDER — HYOSCYAMINE SULFATE 0.125 MG SL SUBL: 0.1250 mg | SUBLINGUAL_TABLET | SUBLINGUAL | Status: DC | PRN

## 2011-01-02 MED ORDER — METOPROLOL TARTRATE 25 MG PO TABS: 25.0000 mg | ORAL_TABLET | ORAL | Status: DC

## 2011-01-02 NOTE — Progress Notes (Signed)
  Subjective:    Patient ID: Lindsey Barrett, female    DOB: December 20, 1945, 65 y.o.   MRN: [id number]  HPI Tachycardia Onset: 8/27 Trigger: caffeine as large cup of coffee followed by tea Duration: since yesterday intermittently Quality:" booming; I forget to breathe" Exacerbating factors : eating, no specific food trigger her Treatment:none  Constitutional:no fever, chills, sweats, fatigue, sleep issues. Weight loss 6 # with PPI over 5 mos Cardiovascular:No chest pain, syncope,  Claudication. Some sweats GI:No change in bowels. Anorexia since 10/2008 Derm: No skin, hair, or nail changes Neurologic:No tremor, numbness and tingling Psych:No anxiety, depression, panic attacks Endocrine: No hoarseness, temperature intolerance Possible triggers:? PPI medication based on package insert.No intake of other   stimulants(decongestants, diet pills, nicotine); just caffeine     Review of Systems  She's had chronic hot flashes for 10 years; for  the last month it is associated with tachycardia as well     Objective:   Physical Exam  Gen.:  well-nourished; in no acute distress Eyes: Extraocular motion intact; no definite lid lag or proptosis Neck:  No deformities, thyromegaly, masses, or tenderness noted.   Supple with full range of motion  Heart: Normal rhythm and rate without significant murmur, gallop,S4 with slight slurring Lungs: Chest clear to auscultation without rales,rales, wheezes Neuro:Deep tendon reflexes are equal and within normal limits; no tremor  Skin/ Nails: Warm and dry without significant lesions or rashes; no onycholysis Psych: Normally communicative and interactive; no abnormal mood or affect clinically.         Assessment & Plan:  #1 tachycardia , caffeine induced  #2 reflux  Plan: See orders and recommendations. EKG reveals low voltage in the precordial leads related to breast tissue.Non specific  T changes diffusely which are basically unchanged compared to  04/13/2010. No significant dysrhythmias are present; there is no evidence of Wolff-Parkinson-White syndrome.

## 2011-01-02 NOTE — Patient Instructions (Addendum)
To prevent  Tachycardia ,palpitations, or premature beats, avoid stimulants such as decongestants, diet pills, nicotine, or caffeine (coffee, tea, cola, or chocolate) to excess.  The triggers for  Gas,dyspepsia  &  "heart burn"  include stress; the "aspirin family" ; alcohol; peppermint; and caffeine (coffee, tea, cola, and chocolate). The aspirin family would include aspirin and the nonsteroidal agents such as ibuprofen &  Naproxen. Tylenol would not cause reflux. If having dyspepsia ; food & drink should be avoided for @ least 2 hours before going to bed.

## 2011-01-03 LAB — CBC WITH DIFFERENTIAL/PLATELET
Basophils Relative: 0.3 % (ref 0.0–3.0)
Eosinophils Relative: 2.7 % (ref 0.0–5.0)
HCT: 38.8 % (ref 36.0–46.0)
Hemoglobin: 12.6 g/dL (ref 12.0–15.0)
Lymphocytes Relative: 28.8 % (ref 12.0–46.0)
Lymphs Abs: 1.4 10*3/uL (ref 0.7–4.0)
Monocytes Relative: 7.8 % (ref 3.0–12.0)
Neutro Abs: 3 10*3/uL (ref 1.4–7.7)
RBC: 4.4 Mil/uL (ref 3.87–5.11)
RDW: 14 % (ref 11.5–14.6)
WBC: 4.9 10*3/uL (ref 4.5–10.5)

## 2011-01-03 LAB — BASIC METABOLIC PANEL
BUN: 15 mg/dL (ref 6–23)
Chloride: 102 mEq/L (ref 96–112)
GFR: 85.44 mL/min (ref >60.00)
Potassium: 4.1 mEq/L (ref 3.5–5.1)
Sodium: 141 mEq/L (ref 135–145)

## 2011-01-03 LAB — TSH: TSH: 1.53 u[IU]/mL (ref 0.35–5.50)

## 2011-01-05 ENCOUNTER — Telehealth: Payer: Self-pay | Admitting: *Deleted

## 2011-01-05 NOTE — Telephone Encounter (Signed)
Patient left a message. She reports she did see Dr. Alwyn Ren for tachycardia. Thinks she needs to change from Omeprazole. Left a message for patient to call me to discuss.

## 2011-01-05 NOTE — Telephone Encounter (Signed)
Spoke with patient and she states she saw Dr. Alwyn Ren on Monday for tachycardia. He told her to get off all stimulants. She has done this and for the last 2 days, she has not taken Omeprazole. She has taken Aciphex. Her heart is not racing and she feels better. She would like to switch from Omeprazole. Her insurance would not cover Aciphex until she had tried Omeprazole. Please, advise.

## 2011-01-07 NOTE — Telephone Encounter (Signed)
Please try Aciphex 20 mg qd, #30, 6 refills,  Pt is intolerant to Prilosec due to tachycadia

## 2011-01-09 MED ORDER — RABEPRAZOLE SODIUM 20 MG PO TBEC: 20.0000 mg | DELAYED_RELEASE_TABLET | Freq: Every day | ORAL | Status: DC

## 2011-01-09 NOTE — Telephone Encounter (Signed)
Rx to pharmacy. Left a message for patient to call me.

## 2011-01-09 NOTE — Telephone Encounter (Signed)
Patient notified of Dr. Regino Schultze recommendations and that I have sent Rx to her pharmacy

## 2011-02-28 ENCOUNTER — Telehealth: Payer: Self-pay | Admitting: Internal Medicine

## 2011-02-28 NOTE — Telephone Encounter (Signed)
Patient states that the aciphex she was prescribed was too expensive so she never picked it up. She states that she has been taking Ranitadine originally prescribed by Dr Alwyn Ren twice daily and feels much better while taking it. She just wanted Dr Juanda Chance to be aware that she was not taking Aciphex. She would also like to know if we will give her a prescription of the Ranitadine if we see fit.

## 2011-02-28 NOTE — Telephone Encounter (Signed)
OK to use Ranitidine 150 mg po bid, #60, 6 refills,

## 2011-03-01 MED ORDER — RANITIDINE HCL 150 MG PO TABS: 150.0000 mg | ORAL_TABLET | Freq: Two times a day (BID) | ORAL | Status: DC

## 2011-03-01 NOTE — Telephone Encounter (Signed)
Left voicemail for patient that Dr Juanda Chance has okayed her to continue Ranitidine twice daily and that rx has been sent to the pharmacy.

## 2011-03-02 ENCOUNTER — Telehealth: Payer: Self-pay | Admitting: Internal Medicine

## 2011-03-02 NOTE — Telephone Encounter (Signed)
Flu has been devastating, especially in individuals over 60. I do strongly encourage it. It is

## 2011-03-02 NOTE — Telephone Encounter (Signed)
Spoke with patient, patient still in question about getting flu vaccine after Dr.Hopper recommendation. I will fax patient flu vaccine VIS and she will read it over and call to schedule appointment if she would like to consider

## 2011-04-11 ENCOUNTER — Encounter: Payer: Self-pay | Admitting: Obstetrics and Gynecology

## 2011-04-14 ENCOUNTER — Other Ambulatory Visit: Payer: Self-pay | Admitting: Internal Medicine

## 2011-04-18 ENCOUNTER — Ambulatory Visit (INDEPENDENT_AMBULATORY_CARE_PROVIDER_SITE_OTHER): Payer: BC Managed Care – PPO

## 2011-04-18 DIAGNOSIS — Z23 Encounter for immunization: Secondary | ICD-10-CM

## 2011-04-20 ENCOUNTER — Encounter: Payer: Self-pay | Admitting: Internal Medicine

## 2011-07-05 ENCOUNTER — Encounter: Payer: Self-pay | Admitting: Gynecology

## 2011-07-05 DIAGNOSIS — N952 Postmenopausal atrophic vaginitis: Secondary | ICD-10-CM | POA: Insufficient documentation

## 2011-07-05 DIAGNOSIS — B009 Herpesviral infection, unspecified: Secondary | ICD-10-CM | POA: Insufficient documentation

## 2011-07-12 ENCOUNTER — Encounter (HOSPITAL_COMMUNITY): Payer: Self-pay | Admitting: Emergency Medicine

## 2011-07-12 ENCOUNTER — Emergency Department (HOSPITAL_COMMUNITY)
Admission: EM | Admit: 2011-07-12 | Discharge: 2011-07-12 | Disposition: A | Discharge status: Home / Self Care | Payer: BC Managed Care – PPO | Attending: Emergency Medicine | Admitting: Emergency Medicine

## 2011-07-12 DIAGNOSIS — K5289 Other specified noninfective gastroenteritis and colitis: Secondary | ICD-10-CM | POA: Insufficient documentation

## 2011-07-12 DIAGNOSIS — R630 Anorexia: Secondary | ICD-10-CM | POA: Insufficient documentation

## 2011-07-12 DIAGNOSIS — Z7982 Long term (current) use of aspirin: Secondary | ICD-10-CM | POA: Insufficient documentation

## 2011-07-12 DIAGNOSIS — K219 Gastro-esophageal reflux disease without esophagitis: Secondary | ICD-10-CM | POA: Insufficient documentation

## 2011-07-12 DIAGNOSIS — R5381 Other malaise: Secondary | ICD-10-CM | POA: Insufficient documentation

## 2011-07-12 DIAGNOSIS — R109 Unspecified abdominal pain: Secondary | ICD-10-CM | POA: Insufficient documentation

## 2011-07-12 DIAGNOSIS — K529 Noninfective gastroenteritis and colitis, unspecified: Secondary | ICD-10-CM

## 2011-07-12 DIAGNOSIS — Z79899 Other long term (current) drug therapy: Secondary | ICD-10-CM | POA: Insufficient documentation

## 2011-07-12 DIAGNOSIS — R197 Diarrhea, unspecified: Secondary | ICD-10-CM | POA: Insufficient documentation

## 2011-07-12 DIAGNOSIS — E785 Hyperlipidemia, unspecified: Secondary | ICD-10-CM | POA: Insufficient documentation

## 2011-07-12 DIAGNOSIS — R112 Nausea with vomiting, unspecified: Secondary | ICD-10-CM | POA: Insufficient documentation

## 2011-07-12 LAB — URINALYSIS, ROUTINE W REFLEX MICROSCOPIC
Bilirubin Urine: NEGATIVE
Glucose, UA: NEGATIVE mg/dL
Hgb urine dipstick: NEGATIVE
Protein, ur: 100 mg/dL — AB
Urobilinogen, UA: 0.2 mg/dL (ref 0.0–1.0)

## 2011-07-12 LAB — COMPREHENSIVE METABOLIC PANEL
ALT: 37 U/L — ABNORMAL HIGH (ref 0–35)
CO2: 27 mEq/L (ref 19–32)
Calcium: 9.7 mg/dL (ref 8.4–10.5)
Chloride: 100 mEq/L (ref 96–112)
Creatinine, Ser: 0.61 mg/dL (ref 0.50–1.10)
GFR calc Af Amer: >90 mL/min (ref >90)
GFR calc non Af Amer: >90 mL/min (ref >90)
Glucose, Bld: 149 mg/dL — ABNORMAL HIGH (ref 70–99)
Sodium: 139 mEq/L (ref 135–145)
Total Bilirubin: 2 mg/dL — ABNORMAL HIGH (ref 0.3–1.2)

## 2011-07-12 LAB — DIFFERENTIAL
Eosinophils Relative: 0 % (ref 0–5)
Lymphocytes Relative: 2 % — ABNORMAL LOW (ref 12–46)
Lymphs Abs: 0.2 10*3/uL — ABNORMAL LOW (ref 0.7–4.0)
Monocytes Absolute: 0.2 10*3/uL (ref 0.1–1.0)
Neutro Abs: 10.3 10*3/uL — ABNORMAL HIGH (ref 1.7–7.7)

## 2011-07-12 LAB — CBC
HCT: 43.4 % (ref 36.0–46.0)
MCV: 85.9 fL (ref 78.0–100.0)
RBC: 5.05 MIL/uL (ref 3.87–5.11)
WBC: 10.8 10*3/uL — ABNORMAL HIGH (ref 4.0–10.5)

## 2011-07-12 LAB — URINE MICROSCOPIC-ADD ON

## 2011-07-12 MED ORDER — SODIUM CHLORIDE 0.9 % IV BOLUS (SEPSIS)
1000.0000 mL | Freq: Once | INTRAVENOUS | Status: AC
  Administered 2011-07-12: 1000 mL via INTRAVENOUS

## 2011-07-12 MED ORDER — ONDANSETRON HCL 8 MG PO TABS
8.0000 mg | ORAL_TABLET | Freq: Three times a day (TID) | ORAL | Status: AC | PRN
Start: 2011-07-12 — End: 2011-07-19

## 2011-07-12 MED ORDER — ONDANSETRON HCL 4 MG/2ML IJ SOLN
4.0000 mg | Freq: Once | INTRAMUSCULAR | Status: AC
  Administered 2011-07-12: 4 mg via INTRAVENOUS
  Filled 2011-07-12: qty 2

## 2011-07-12 MED ORDER — SODIUM CHLORIDE 0.9 % IV SOLN
INTRAVENOUS | Status: DC
  Administered 2011-07-12: 07:00:00 via INTRAVENOUS

## 2011-07-12 NOTE — ED Notes (Signed)
Tolerated po liquids

## 2011-07-12 NOTE — Discharge Instructions (Signed)
Stay on a clear liquid diet for one to 2 days, then start a bland  Diet.B.R.A.T. Diet Your doctor has recommended the B.R.A.T. diet for you or your child until the condition improves. This is often used to help control diarrhea and vomiting symptoms. If you or your child can tolerate clear liquids, you may have:  Bananas.   Rice.   Applesauce.   Toast (and other simple starches such as crackers, potatoes, noodles).  Be sure to avoid dairy products, meats, and fatty foods until symptoms are better. Fruit juices such as apple, grape, and prune juice can make diarrhea worse. Avoid these. Continue this diet for 2 days or as instructed by your caregiver. Document Released: 04/23/2005 Document Revised: 04/12/2011 Document Reviewed: 10/10/2006 [facility] Patient Information 2012 [place], [place].Clear Liquid Diet The clear liquid dietconsists of foods that are liquid or will become liquid at room temperature.You should be able to see through the liquid and beverages. Examples of foods allowed on a clear liquid diet include fruit juice, broth or bouillon, gelatin, or frozen ice pops. The purpose of this diet is to provide necessary fluid, electrolytes such as sodium and potassium, and energy to keep the body functioning during times when you are not able to consume a regular diet.A clear liquid diet should not be continued for long periods of time as it is not nutritionally adequate.  REASONS FOR USING A CLEAR LIQUID DIET  In sudden onset (acute) conditions for a patient before or after surgery.   As the first step in oral feeding.   For fluid and electrolyte replacement in diarrheal diseases.   As a diet before certain medical tests are performed.  ADEQUACY The clear liquid diet is adequate only in ascorbic acid, according to the Recommended Dietary Allowances of the [employer]. CHOOSING FOODS Breads and Starches  Allowed:  None are allowed.   Avoid: All are avoided.    Vegetables  Allowed:  Strained tomato or vegetable juice.   Avoid: Any others.  Fruit  Allowed:  Strained fruit juices and fruit drinks. Include 1 serving of citrus or vitamin C-enriched fruit juice daily.   Avoid: Any others.  Meat and Meat Substitutes  Allowed:  None are allowed.   Avoid: All are avoided.  Milk  Allowed:  None are allowed.   Avoid: All are avoided.  Soups and Combination Foods  Allowed:  Clear bouillon, broth, or strained broth-based soups.   Avoid: Any others.  Desserts and Sweets  Allowed:  Sugar, honey. High protein gelatin. Flavored gelatin, ices, or frozen ice pops that do not contain milk.   Avoid: Any others.  Fats and Oils  Allowed:  None are allowed.   Avoid: All are avoided.  Beverages  Allowed: Cereal beverages, coffee (regular or decaffeinated), tea, or soda at the discretion of your caregiver.   Avoid: Any others.  Condiments  Allowed:  Iodized salt.   Avoid: Any others, including pepper.  Supplements  Allowed:  Liquid nutrition beverages.   Avoid: Any others that contain lactose or fiber.  SAMPLE MEAL PLAN Breakfast  4 oz (120 mL) strained orange juice.    to 1 cup (125 to 250 mL) gelatin (plain or fortified).   1 cup (250 mL) beverage (coffee or tea).   Sugar, if desired.  Midmorning Snack   cup (125 mL) gelatin (plain or fortified).  Lunch  1 cup (250 mL) broth or consomm.   4 oz (120 mL) strained grapefruit juice.    cup (125  mL) gelatin (plain or fortified).   1 cup (250 mL) beverage (coffee or tea).   Sugar, if desired.  Midafternoon Snack   cup (125 mL) fruit ice.    cup (125 mL) strained fruit juice.  Dinner  1 cup (250 mL) broth or consomm.    cup (125 mL) cranberry juice.    cup (125 mL) flavored gelatin (plain or fortified).   1 cup (250 mL) beverage (coffee or tea).   Sugar, if desired.  Evening Snack  4 oz (120 mL) strained apple juice (vitamin C-fortified).    cup  (125 mL) flavored gelatin (plain or fortified).  Document Released: 04/23/2005 Document Revised: 04/12/2011 Document Reviewed: 07/21/2010 [facility] Patient Information 2012 [place], [place].Diet for Diarrhea, Adult Having frequent, runny stools (diarrhea) has many causes. Diarrhea may be caused or worsened by food or drink. Diarrhea may be relieved by changing your diet. IF YOU ARE NOT TOLERATING SOLID FOODS:  Drink enough water and fluids to keep your urine clear or pale yellow.   Avoid sugary drinks and sodas as well as milk-based beverages.   Avoid beverages containing caffeine and alcohol.   You may try rehydrating beverages. You can make your own by following this recipe:    tsp table salt.    tsp baking soda.   ? tsp salt substitute (potassium chloride).   1 tbs + 1 tsp sugar.   1 qt water.  As your stools become more solid, you can start eating solid foods. Add foods one at a time. If a certain food causes your diarrhea to get worse, avoid that food and try other foods. A low fiber, low-fat, and lactose-free diet is recommended. Small, frequent meals may be better tolerated.  Starches  Allowed:  White, [place], and pita breads, plain rolls, buns, bagels. Plain muffins, matzo. Soda, saltine, or graham crackers. Pretzels, melba toast, zwieback. Cooked cereals made with water: cornmeal, farina, cream cereals. Dry cereals: refined corn, wheat, rice. Potatoes prepared any way without skins, refined macaroni, spaghetti, noodles, refined rice.   Avoid:  Bread, rolls, or crackers made with whole wheat, multi-grains, rye, bran seeds, nuts, or coconut. Corn tortillas or taco shells. Cereals containing whole grains, multi-grains, bran, coconut, nuts, or raisins. Cooked or dry oatmeal. Coarse wheat cereals, granola. Cereals advertised as "high-fiber." Potato skins. Whole grain pasta, wild or brown rice. Popcorn. Sweet potatoes/yams. Sweet rolls, doughnuts, waffles, pancakes, sweet breads.    Vegetables  Allowed: Strained tomato and vegetable juices. Most well-cooked and canned vegetables without seeds. Fresh: Tender lettuce, cucumber without the skin, cabbage, spinach, bean sprouts.   Avoid: Fresh, cooked, or canned: Artichokes, baked beans, beet greens, broccoli, Brussels sprouts, corn, kale, legumes, peas, sweet potatoes. Cooked: Green or red cabbage, spinach. Avoid large servings of any vegetables, because vegetables shrink when cooked, and they contain more fiber per serving than fresh vegetables.  Fruit  Allowed: All fruit juices except prune juice. Cooked or canned: Apricots, applesauce, cantaloupe, cherries, fruit cocktail, grapefruit, grapes, kiwi, mandarin oranges, peaches, pears, plums, watermelon. Fresh: Apples without skin, ripe banana, grapes, cantaloupe, cherries, grapefruit, peaches, oranges, plums. Keep servings limited to  cup or 1 piece.   Avoid: Fresh: Apple with skin, apricots, mango, pears, raspberries, strawberries. Prune juice, stewed or dried prunes. Dried fruits, raisins, dates. Large servings of all fresh fruits.  Meat and Meat Substitutes  Allowed: Ground or well-cooked tender beef, ham, veal, lamb, pork, or poultry. Eggs, plain cheese. Fish, oysters, shrimp, lobster, other seafoods. Liver, organ meats.  Avoid: Tough, fibrous meats with gristle. Peanut butter, smooth or chunky. Cheese, nuts, seeds, legumes, dried peas, beans, lentils.  Milk  Allowed: Yogurt, lactose-free milk, kefir, drinkable yogurt, buttermilk, soy milk.   Avoid: Milk, chocolate milk, beverages made with milk, such as milk shakes.  Soups  Allowed: Bouillon, broth, or soups made from allowed foods. Any strained soup.   Avoid: Soups made from vegetables that are not allowed, cream or milk-based soups.  Desserts and Sweets  Allowed: Sugar-free gelatin, sugar-free frozen ice pops made without sugar alcohol.   Avoid: Plain cakes and cookies, pie made with allowed fruit, pudding,  custard, cream pie. Gelatin, fruit, ice, sherbet, frozen ice pops. Ice cream, ice milk without nuts. Plain hard candy, honey, jelly, molasses, syrup, sugar, chocolate syrup, gumdrops, marshmallows.  Fats and Oils  Allowed: Avoid any fats and oils.   Avoid: Seeds, nuts, olives, avocados. Margarine, butter, cream, mayonnaise, salad oils, plain salad dressings made from allowed foods. Plain gravy, crisp bacon without rind.  Beverages  Allowed: Water, decaffeinated teas, oral rehydration solutions, sugar-free beverages.   Avoid: Fruit juices, caffeinated beverages (coffee, tea, soda or pop), alcohol, sports drinks, or lemon-lime soda or pop.  Condiments  Allowed: Ketchup, mustard, horseradish, vinegar, cream sauce, cheese sauce, cocoa powder. Spices in moderation: allspice, basil, bay leaves, celery powder or leaves, cinnamon, cumin powder, curry powder, ginger, mace, marjoram, onion or garlic powder, oregano, paprika, parsley flakes, ground pepper, rosemary, sage, savory, tarragon, thyme, turmeric.   Avoid: Coconut, honey.  Weight Monitoring: Weigh yourself every day. You should weigh yourself in the morning after you urinate and before you eat breakfast. Wear the same amount of clothing when you weigh yourself. Record your weight daily. Bring your recorded weights to your clinic visits. Tell your caregiver right away if you have gained 3 lb/1.4 kg or more in 1 day, 5 lb/2.3 kg in a week, or whatever amount you were told to report. SEEK IMMEDIATE MEDICAL CARE IF:   You are unable to keep fluids down.   You start to throw up (vomit) or diarrhea keeps coming back (persistent).   Abdominal pain develops, increases, or can be felt in one place (localizes).   You have an oral temperature above 102 F (38.9 C), not controlled by medicine.   Diarrhea contains blood or mucus.   You develop excessive weakness, dizziness, fainting, or extreme thirst.  MAKE SURE YOU:   Understand these  instructions.   Will watch your condition.   Will get help right away if you are not doing well or get worse.  Document Released: 07/14/2003 Document Revised: 04/12/2011 Document Reviewed: 11/04/2008 [facility] Patient Information 2012 [place], [place].Viral Gastroenteritis Viral gastroenteritis is also known as stomach flu. This condition affects the stomach and intestinal tract. It can cause sudden diarrhea and vomiting. The illness typically lasts 3 to 8 days. Most people develop an immune response that eventually gets rid of the virus. While this natural response develops, the virus can make you quite ill. CAUSES  Many different viruses can cause gastroenteritis, such as rotavirus or noroviruses. You can catch one of these viruses by consuming contaminated food or water. You may also catch a virus by sharing utensils or other personal items with an infected person or by touching a contaminated surface. SYMPTOMS  The most common symptoms are diarrhea and vomiting. These problems can cause a severe loss of body fluids (dehydration) and a body salt (electrolyte) imbalance. Other symptoms may include:  Fever.   Headache.  Fatigue.   Abdominal pain.  DIAGNOSIS  Your caregiver can usually diagnose viral gastroenteritis based on your symptoms and a physical exam. A stool sample may also be taken to test for the presence of viruses or other infections. TREATMENT  This illness typically goes away on its own. Treatments are aimed at rehydration. The most serious cases of viral gastroenteritis involve vomiting so severely that you are not able to keep fluids down. In these cases, fluids must be given through an intravenous line (IV). HOME CARE INSTRUCTIONS   Drink enough fluids to keep your urine clear or pale yellow. Drink small amounts of fluids frequently and increase the amounts as tolerated.   Ask your caregiver for specific rehydration instructions.   Avoid:   Foods high in sugar.    Alcohol.   Carbonated drinks.   Tobacco.   Juice.   Caffeine drinks.   Extremely hot or cold fluids.   Fatty, greasy foods.   Too much intake of anything at one time.   Dairy products until 24 to 48 hours after diarrhea stops.   You may consume probiotics. Probiotics are active cultures of beneficial bacteria. They may lessen the amount and number of diarrheal stools in adults. Probiotics can be found in yogurt with active cultures and in supplements.   Wash your hands well to avoid spreading the virus.   Only take over-the-counter or prescription medicines for pain, discomfort, or fever as directed by your caregiver. Do not give aspirin to children. Antidiarrheal medicines are not recommended.   Ask your caregiver if you should continue to take your regular prescribed and over-the-counter medicines.   Keep all follow-up appointments as directed by your caregiver.  SEEK IMMEDIATE MEDICAL CARE IF:   You are unable to keep fluids down.   You do not urinate at least once every 6 to 8 hours.   You develop shortness of breath.   You notice blood in your stool or vomit. This may look like coffee grounds.   You have abdominal pain that increases or is concentrated in one small area (localized).   You have persistent vomiting or diarrhea.   You have a fever.   The patient is a child younger than 3 months, and he or she has a fever.   The patient is a child older than 3 months, and he or she has a fever and persistent symptoms.   The patient is a child older than 3 months, and he or she has a fever and symptoms suddenly get worse.   The patient is a baby, and he or she has no tears when crying.  MAKE SURE YOU:   Understand these instructions.   Will watch your condition.   Will get help right away if you are not doing well or get worse.  Document Released: 04/23/2005 Document Revised: 04/12/2011 Document Reviewed: 02/07/2011 [facility] Patient Information 2012  [place], [place].

## 2011-07-12 NOTE — ED Notes (Signed)
Patient here from home with c/o N/V/D and sharp mid-abdominal pain since approx. 1930 yesterday. Denies CP/SOB/ blood in stool or emesis.

## 2011-07-12 NOTE — ED Provider Notes (Signed)
History     CSN: [id number]  Arrival date & time 07/12/11  0435   First MD Initiated Contact with Patient 07/12/11 [phone]      Chief Complaint  Patient presents with  . Nausea  . Diarrhea  . Emesis    (Consider location/radiation/quality/duration/timing/severity/associated sxs/prior treatment) Patient is a 66 y.o. female presenting with diarrhea and vomiting. The history is provided by the patient.  Diarrhea The primary symptoms include vomiting and diarrhea. The illness began yesterday.  The illness is also significant for anorexia. The illness does not include chills, bloating or back pain.  Emesis  Associated symptoms include diarrhea. Pertinent negatives include no chills.   she denies known sick contacts. She feels like she might have eaten some bad food yesterday. She has additionally felt general malaise for about 5 days. She did not try to take anything for the discomfort at home. She cannot eat or drink because it makes her vomiting, worse  Past Medical History  Diagnosis Date  . Fasting hyperglycemia   . DDD (degenerative disc disease)   . Gilbert syndrome   . Breast cyst   . Hyperlipidemia   . Anxiety   . GERD (gastroesophageal reflux disease)   . Diverticulosis   . HSV infection   . Atrophic vaginitis     Past Surgical History  Procedure Date  . Appendectomy   . Breast cyst aspiration     x2 right x1 left  . Wisdom tooth extraction   . Dilation and curettage of uterus   . Colonoscopy w/ biopsies 10/2010    Per patient  . Pelvic laparoscopy     Family History  Problem Relation Age of Onset  . Lung cancer Mother   . Endometriosis Mother   . Liver cancer Maternal Uncle   . Stomach cancer Maternal Grandmother   . Stroke Paternal Grandfather   . Heart disease Paternal Grandfather   . Leukemia Maternal Uncle   . Breast cancer Paternal Aunt     great paternal aunt  . Colon cancer Paternal Aunt     great paternal aunt  . Coronary artery disease Father     . Cancer Father     intestinal carcinoid tumors  . Heart disease Father   . Aneurysm      grandmother  . Sudden death Sister     103 week old, congenital deformity  . Heart disease Paternal Grandmother     History  Substance Use Topics  . Smoking status: Former Smoker    Types: Cigarettes    Quit date: 05/07/1998  . Smokeless tobacco: Never Used  . Alcohol Use: No     Rare    OB History    Grav Para Term Preterm Abortions TAB SAB Ect Mult Living   0               Review of Systems  Constitutional: Negative for chills.  Gastrointestinal: Positive for vomiting, diarrhea and anorexia. Negative for bloating.  Musculoskeletal: Negative for back pain.  All other systems reviewed and are negative.    Allergies  Pravastatin sodium; Procaine hcl; Acetaminophen; Fish oil; and Prednisone  Home Medications   Current Outpatient Rx  Name Route Sig Dispense Refill  . ASPIRIN 81 MG PO TABS Oral Take 162 mg by mouth as directed. 2 by mouth daily    . TUMS E-X 750 PO Oral Take by mouth as needed.      . CRESTOR 40 MG PO TABS  TAKE ONE TABLET  BY MOUTH EVERY DAY 30 each 8  . RANITIDINE HCL 150 MG PO TABS Oral Take 1 tablet (150 mg total) by mouth 2 (two) times daily. 60 tablet 6  . ONDANSETRON HCL 8 MG PO TABS Oral Take 1 tablet (8 mg total) by mouth every 8 (eight) hours as needed for nausea. 20 tablet 0    BP 113/55  Pulse 89  Temp(Src) 98.7 F (37.1 C) (Oral)  Resp 18  SpO2 96%  Physical Exam  Nursing note and vitals reviewed. Constitutional: She is oriented to person, place, and time. She appears well-developed and well-nourished.  HENT:  Head: Normocephalic and atraumatic.  Eyes: Conjunctivae and EOM are normal. Pupils are equal, round, and reactive to light.  Neck: Normal range of motion and phonation normal. Neck supple.  Cardiovascular: Normal rate, regular rhythm and intact distal pulses.   Pulmonary/Chest: Effort normal and breath sounds normal. She exhibits no  tenderness.  Abdominal: Soft. Bowel sounds are normal. She exhibits no distension. There is tenderness (Mild diffuse tenderness). There is no guarding.  Musculoskeletal: Normal range of motion.  Neurological: She is alert and oriented to person, place, and time. She has normal strength. She exhibits normal muscle tone.  Skin: Skin is warm and dry.  Psychiatric: She has a normal mood and affect. Her behavior is normal. Judgment and thought content normal.    ED Course  Procedures (including critical care time)  Emergency department treatment: IV fluid bolus drip. Zofran, IV.   Labs Reviewed  CBC - Abnormal; Notable for the following:    WBC 10.8 (*)    All other components within normal limits  DIFFERENTIAL - Abnormal; Notable for the following:    Neutrophils Relative 96 (*)    Neutro Abs 10.3 (*)    Lymphocytes Relative 2 (*)    Lymphs Abs 0.2 (*)    Monocytes Relative 2 (*)    All other components within normal limits  COMPREHENSIVE METABOLIC PANEL - Abnormal; Notable for the following:    Glucose, Bld 149 (*)    ALT 37 (*)    Total Bilirubin 2.0 (*)    All other components within normal limits  URINALYSIS, ROUTINE W REFLEX MICROSCOPIC - Abnormal; Notable for the following:    Ketones, ur TRACE (*)    Protein, ur 100 (*)    Leukocytes, UA SMALL (*)    All other components within normal limits  URINE MICROSCOPIC-ADD ON - Abnormal; Notable for the following:    Bacteria, UA MANY (*)    Casts HYALINE CASTS (*)    All other components within normal limits  LIPASE, BLOOD  URINE CULTURE   7:47 AM Reevaluation with update and discussion. After initial assessment and treatment, an updated evaluation reveals  the patient is more comfortable now. She is tolerating ice chips. she again reiterates that she has had no urinary tract symptoms. She prefers to wait for urine culture for initiating treatment for a possible urinary tract infection. Sherina Stammer L     1. Gastroenteritis        MDM   signs, and symptoms of gastroenteritis or food poisoning . Patient has chronic history of intestinal disorder and Sees a GI Dr. Leveda Anna UTI, Culture Has Been Ordered. No Evidence of Metabolic Instability or Suspected Serious Bacterial Infection. She Is Stable for Discharge.   Plan: Home Medications- Zofran; Home Treatments- clear liquids; Recommended follow up- GI doctor         Flint Melter, MD 07/12/11 2317

## 2011-07-12 NOTE — ED Notes (Signed)
Pt states she started having diarrhea about 1930 yesterday evening and then about an hour later she started having vomiting  Pt states about 30 minutes later she started having both and has had it about every 30 minutes since then  Pt states she has tried ice chips and some ginger ale but is unable to hold anything down  Pt states she feels dehydrated  Pt lives alone

## 2011-07-13 ENCOUNTER — Encounter: Payer: BC Managed Care – PPO | Admitting: Obstetrics and Gynecology

## 2011-07-13 LAB — URINE CULTURE: Colony Count: 50000

## 2011-07-16 ENCOUNTER — Telehealth: Payer: Self-pay | Admitting: Internal Medicine

## 2011-07-16 NOTE — Telephone Encounter (Signed)
Patient returned call and left a message to call her tomorrow at [phone] her work number to set up OV

## 2011-07-16 NOTE — Telephone Encounter (Signed)
Left a message for patient to call me. 

## 2011-07-17 NOTE — Telephone Encounter (Signed)
Reports she went to ED for Norovirus last week and was told to f/u with GI MD. States even before he had the virus, she has been having problems with gas, belching and nausea if she eats and if she doesn't. It has gotten so bad she does not go out to eat due the gas, belching. Wants to scheduled OV.  She states she cannot come in March but would like to schedule in April. Last EGD 10/25/10-reflux. Scheduled patient on 08/10/11 at 3:00 PM.

## 2011-07-17 NOTE — Telephone Encounter (Signed)
Left a message for patient to call me. 

## 2011-07-17 NOTE — Telephone Encounter (Signed)
Reviewed and agree.

## 2011-07-17 NOTE — Telephone Encounter (Signed)
Left message again.

## 2011-08-08 ENCOUNTER — Ambulatory Visit (INDEPENDENT_AMBULATORY_CARE_PROVIDER_SITE_OTHER): Payer: BC Managed Care – PPO | Admitting: Obstetrics and Gynecology

## 2011-08-08 ENCOUNTER — Encounter: Payer: Self-pay | Admitting: Obstetrics and Gynecology

## 2011-08-08 ENCOUNTER — Other Ambulatory Visit: Payer: Self-pay | Admitting: Obstetrics and Gynecology

## 2011-08-08 ENCOUNTER — Other Ambulatory Visit (HOSPITAL_COMMUNITY)
Admission: RE | Admit: 2011-08-08 | Discharge: 2011-08-08 | Disposition: A | Discharge status: Home / Self Care | Payer: BC Managed Care – PPO | Source: Ambulatory Visit | Attending: Obstetrics and Gynecology | Admitting: Obstetrics and Gynecology

## 2011-08-08 VITALS — BP 116/70 | Ht 64.0 in | Wt 166.0 lb

## 2011-08-08 DIAGNOSIS — Z780 Asymptomatic menopausal state: Secondary | ICD-10-CM

## 2011-08-08 DIAGNOSIS — Z01419 Encounter for gynecological examination (general) (routine) without abnormal findings: Secondary | ICD-10-CM | POA: Insufficient documentation

## 2011-08-08 DIAGNOSIS — N390 Urinary tract infection, site not specified: Secondary | ICD-10-CM

## 2011-08-08 DIAGNOSIS — A64 Unspecified sexually transmitted disease: Secondary | ICD-10-CM | POA: Insufficient documentation

## 2011-08-08 DIAGNOSIS — N951 Menopausal and female climacteric states: Secondary | ICD-10-CM

## 2011-08-08 LAB — URINALYSIS W MICROSCOPIC + REFLEX CULTURE
Bilirubin Urine: NEGATIVE
Casts: NONE SEEN
Crystals: NONE SEEN
Glucose, UA: NEGATIVE mg/dL
Ketones, ur: NEGATIVE mg/dL
Nitrite: NEGATIVE
Specific Gravity, Urine: 1.015 (ref 1.005–1.030)
pH: 5.5 (ref 5.0–8.0)

## 2011-08-08 NOTE — Progress Notes (Signed)
Patient came to see me today for her annual GYN exam. She is having some vaginal dryness which responds to replens. She is having some hot flashes but not enough to require hormone replacement. She previously did do hormone replacement. She is up-to-date on mammograms. She has had 2 normal bone densities. She was asking me today about calcium and vitamin D supplement. She has never had her vitamin D level checked. She is having no vaginal bleeding. She is having no pelvic pain. She went to the emergency room several weeks ago very dehydrated. She white blood cells in her urine but no red blood cells. Her culture was negative. Today she has many more white blood cells in her urine and also some red blood cells. She is not having urinary tract infection symptoms. Her mother had bladder cancer.  Physical examination: Kennon Portela present. HEENT within normal limits. Neck: Thyroid not large. No masses. Supraclavicular nodes: not enlarged. Breasts: Examined in both sitting and lying  position. No skin changes and no masses. Abdomen: Soft no guarding rebound or masses or hernia. Pelvic: External: Within normal limits. BUS: Within normal limits. Vaginal:within normal limits. Poor estrogen effect. No evidence of cystocele rectocele or enterocele. Cervix: clean. Uterus: Normal size and shape. Adnexa: No masses. Rectovaginal exam: Confirmatory and negative. Extremities: Within normal limits.  Assessment: #1. Mild menopausal symptoms #2. Atrophic vaginitis #3. Abnormal urinalysis  Plan: We will wait for urine culture results to treat. I think based on today's urinalysis we need to treat her regardless but will let the culture dictate treatment. Patient understands that she will need a followup urinalysis after treatment. She knows we will refer her to a urologist for persistent hematuria. She will continue yearly mammograms. We discussed adequate calcium intake. We were going to check a vitamin D level but I  believe she left prior to checking it. We will do it when she comes back for followup urine.

## 2011-08-10 ENCOUNTER — Ambulatory Visit (INDEPENDENT_AMBULATORY_CARE_PROVIDER_SITE_OTHER): Payer: BC Managed Care – PPO | Admitting: Internal Medicine

## 2011-08-10 ENCOUNTER — Encounter: Payer: Self-pay | Admitting: Internal Medicine

## 2011-08-10 VITALS — BP 118/64 | HR 80 | Ht 64.0 in | Wt 167.0 lb

## 2011-08-10 DIAGNOSIS — K5289 Other specified noninfective gastroenteritis and colitis: Secondary | ICD-10-CM

## 2011-08-10 NOTE — Progress Notes (Signed)
Lindsey Barrett 18-Jan-1946 MRN [id number]   History of Present Illness:  This is a 66 year old white female with an episode on acute gastroenteritis 4 weeks ago. She is now fully recovered having normal bowel movements and being able to tolerate a regular diet. She has a history of gastroesophageal reflux evaluated by an upper endoscopy in June 2012. Biopsies at the GE junction showed changes consistent with gastroesophageal reflux. She had a colonoscopy in 2007 showing moderately severe diverticulosis. She denies any change in bowel habits or abdominal pain. She drinks 1-2 glasses of wine a day. She takes 162 mg of aspirin a day.    Past Medical History  Diagnosis Date  . Fasting hyperglycemia   . DDD (degenerative disc disease)   . Gilbert syndrome   . Breast cyst   . Hyperlipidemia   . Anxiety   . GERD (gastroesophageal reflux disease)   . Diverticulosis   . HSV infection   . Atrophic vaginitis   . STD (sexually transmitted disease)     HSV   Past Surgical History  Procedure Date  . Appendectomy   . Breast cyst aspiration     x2 right x1 left  . Wisdom tooth extraction   . Dilation and curettage of uterus   . Colonoscopy w/ biopsies 10/2010    Per patient  . Pelvic laparoscopy     reports that she quit smoking about 13 years ago. Her smoking use included Cigarettes. She has never used smokeless tobacco. She reports that she drinks about .6 ounces of alcohol per week. She reports that she does not use illicit drugs. family history includes Breast cancer in her paternal aunt; Cancer in her father and mother; Colon cancer in her paternal aunt; Coronary artery disease in her father; Endometriosis in her mother; Heart disease in her father, paternal grandfather, and paternal grandmother; Leukemia in her maternal uncle; Liver cancer in her maternal uncle; Lung cancer in her mother; Other in her mother; Stomach cancer in her maternal grandmother; Stroke in her paternal grandfather;  and Sudden death in her sister. Allergies  Allergen Reactions  . Pravastatin Sodium     Breasts swollen & tender  . Procaine Hcl Palpitations    Tachycardia from Procaine with Epinephrine  . Acetaminophen     Nausea & vomiting  . Fish Oil     gas  . Prednisone     Fear of potential adverse effects        Review of Systems: Denies dysphagia, odynophagia, shortness breath or chest pain  The remainder of the 10 point ROS is negative except as outlined in H&P   Physical Exam: General appearance  Well developed, in no distress. Eyes- non icteric. HEENT nontraumatic, normocephalic. Mouth no lesions, tongue papillated, no cheilosis. Neck supple without adenopathy, thyroid not enlarged, no carotid bruits, no JVD. Lungs Clear to auscultation bilaterally. Cor normal S1, normal S2, regular rhythm, no murmur,  quiet precordium. Abdomen: Soft relaxed abdomen with normoactive bowel sounds. No tenderness. Liver edge at costal margin. No ascites. Rectal: Showed Hemoccult negative stool Extremities no pedal edema. Skin no lesions. Neurological alert and oriented x 3. Psychological normal mood and affect.  Assessment and Plan:  Problem #1 Acute gastroenteritis with complete resolution. Patient has a history of gastroesophageal reflux which is currently well controlled on ranitidine 150 mg a day. She will continue on a high fiber diet and increase her exercise to lose about 10 pounds. Her next colonoscopy should be in February 2017.  08/10/2011 [name]

## 2011-08-10 NOTE — Patient Instructions (Addendum)
Dr Alwyn Ren, Dr Oletha Blend

## 2011-08-11 LAB — URINE CULTURE: Colony Count: 15000

## 2011-08-14 MED ORDER — CIPROFLOXACIN HCL 500 MG PO TABS: 500.0000 mg | ORAL_TABLET | Freq: Two times a day (BID) | ORAL | Status: AC

## 2011-08-14 NOTE — Progress Notes (Signed)
Addended by: Venora Maples on: 08/14/2011 02:19 PM   Modules accepted: Orders

## 2011-08-28 ENCOUNTER — Other Ambulatory Visit: Payer: BC Managed Care – PPO

## 2011-08-28 DIAGNOSIS — Z780 Asymptomatic menopausal state: Secondary | ICD-10-CM

## 2011-08-28 DIAGNOSIS — N390 Urinary tract infection, site not specified: Secondary | ICD-10-CM

## 2011-08-29 LAB — URINALYSIS W MICROSCOPIC + REFLEX CULTURE
Bilirubin Urine: NEGATIVE
Casts: NONE SEEN
Glucose, UA: NEGATIVE mg/dL
Hgb urine dipstick: NEGATIVE
Protein, ur: NEGATIVE mg/dL
Squamous Epithelial / LPF: NONE SEEN
pH: 5.5 (ref 5.0–8.0)

## 2011-08-30 LAB — URINE CULTURE: Colony Count: NO GROWTH

## 2011-08-31 ENCOUNTER — Other Ambulatory Visit: Payer: Self-pay | Admitting: *Deleted

## 2011-10-15 ENCOUNTER — Other Ambulatory Visit: Payer: Self-pay | Admitting: Internal Medicine

## 2011-10-18 ENCOUNTER — Ambulatory Visit (INDEPENDENT_AMBULATORY_CARE_PROVIDER_SITE_OTHER): Payer: BC Managed Care – PPO | Admitting: Internal Medicine

## 2011-10-18 ENCOUNTER — Encounter: Payer: Self-pay | Admitting: Internal Medicine

## 2011-10-18 VITALS — BP 112/64 | HR 79 | Temp 98.2°F | Resp 16 | Wt 169.4 lb

## 2011-10-18 DIAGNOSIS — E559 Vitamin D deficiency, unspecified: Secondary | ICD-10-CM | POA: Insufficient documentation

## 2011-10-18 DIAGNOSIS — K219 Gastro-esophageal reflux disease without esophagitis: Secondary | ICD-10-CM

## 2011-10-18 DIAGNOSIS — IMO0001 Reserved for inherently not codable concepts without codable children: Secondary | ICD-10-CM

## 2011-10-18 DIAGNOSIS — F411 Generalized anxiety disorder: Secondary | ICD-10-CM

## 2011-10-18 DIAGNOSIS — R142 Eructation: Secondary | ICD-10-CM

## 2011-10-18 DIAGNOSIS — F419 Anxiety disorder, unspecified: Secondary | ICD-10-CM

## 2011-10-18 DIAGNOSIS — R141 Gas pain: Secondary | ICD-10-CM

## 2011-10-18 DIAGNOSIS — T887XXA Unspecified adverse effect of drug or medicament, initial encounter: Secondary | ICD-10-CM | POA: Insufficient documentation

## 2011-10-18 MED ORDER — SERTRALINE HCL 25 MG PO TABS: 25.0000 mg | ORAL_TABLET | Freq: Every day | ORAL | Status: DC

## 2011-10-18 MED ORDER — HYOSCYAMINE SULFATE 0.125 MG SL SUBL: 0.1250 mg | SUBLINGUAL_TABLET | SUBLINGUAL | Status: DC | PRN

## 2011-10-18 NOTE — Progress Notes (Signed)
  Subjective:    Patient ID: Lindsey Barrett, female    DOB: [birth year], 66 y.o.   MRN: [id number]  HPI She was seen in the emergency room @ [place] 10[place]12/11 with heart pounding, weakness, dizziness, belching, increased gas, and anorexia. Those records of rhythm strips and vitals were reviewed were reviewed. No labs were performed. EKG did reveal inferior and anterior nonspecific T changes. Compared to 12/17/10 is not been any significant change.  She was monitored on telemetry and discharged without change in therapy after an hour.    Review of Systems  She has not had recurrence of symptoms but did have a vertigo attack since the emergency room visit. She has had chronic tinnitus.  Her fluid intake is mainly water as she is worried about alcohol or other fluids aggravating her reflux. Her gastroenterologist has done ultrasound studies and endoscopic studies. There was no serious pathology noted.  She feels that the "gas" causes chest pain and arm discomfort. She is on ranitidine from the Gastroenterologist. The last GI evaluation was reviewed.   She was prescribed medication for anxiety past but did not take it after reading the package insert with the  "grocery list" of  potential side effects. She previously drank alcohol treat her anxiety but this now causes significant dyspepsia. She does not experience a constellation of headache, chest pain, flushing, and diarrhea.  Serial TSHs have been normal; last value was 8/12  Her vitamin D level was 16; Dr. Eda Paschal initiated 2000 international units of vitamin D 3 daily. She noticed a dramatic increase in her energy within days.     Objective:   Physical Exam General appearance is one of good health and nourishment w/o distress.  Eyes: No conjunctival inflammation or scleral icterus is present. No lid lag is present  Neck: No lymphadenopathy or masses. Thyroid normal to palpation  Oral exam: Dental hygiene is good;  lips and gums are healthy appearing.There is no oropharyngeal erythema or exudate noted.   Heart:  Normal rate and regular rhythm. S1 and S2 normal without gallop, murmur,  rub. Click versus S4 at left lower sternal border    Lungs:Chest clear to auscultation; no wheezes, rhonchi,rales ,or rubs present.No increased work of breathing.   Abdomen: bowel sounds normal, soft and non-tender without masses, organomegaly or hernias noted.  No guarding or rebound   Skin:Warm & dry.  Intact without suspicious lesions or rashes ; no jaundice or tenting  Neuro: Deep tendon reflexes are equal and normal. No tremor present  Extremities: No clubbing, cyanosis, or significant edema. No onycholysis  Lymphatic: No lymphadenopathy is noted about the head, neck, axilla areas.              Assessment & Plan:  #1 "gas" with associated cardiac symptoms. History suggest a hiatal hernia.  #2 anxiety; she is concerned about the potential adverse effects of serotonin agents. The pathophysiology of serotonin deficiency was discussed.  Plan: See orders and recommendations

## 2011-10-18 NOTE — Patient Instructions (Signed)
Avoid excess alcohol; peppermint; and caffeine (coffee, tea, cola, and chocolate). Food & drink should be avoided for @ least 2 hours before going to bed.

## 2011-11-06 ENCOUNTER — Other Ambulatory Visit: Payer: Self-pay | Admitting: Dermatology

## 2011-12-31 ENCOUNTER — Telehealth: Payer: Self-pay | Admitting: *Deleted

## 2011-12-31 DIAGNOSIS — E559 Vitamin D deficiency, unspecified: Secondary | ICD-10-CM

## 2011-12-31 NOTE — Addendum Note (Signed)
Addended by: Aura Camps on: 12/31/2011 03:34 PM   Modules accepted: Orders

## 2011-12-31 NOTE — Telephone Encounter (Signed)
Pt called asking if she could have vit d recheck at [facility], pt informed okay to do this, new order placed for Rachel to draw lab.

## 2012-01-01 ENCOUNTER — Other Ambulatory Visit: Payer: BC Managed Care – PPO

## 2012-01-14 ENCOUNTER — Telehealth: Payer: Self-pay | Admitting: Internal Medicine

## 2012-01-14 MED ORDER — RANITIDINE HCL 150 MG PO TABS: 150.0000 mg | ORAL_TABLET | Freq: Two times a day (BID) | ORAL | Status: DC

## 2012-01-14 NOTE — Telephone Encounter (Signed)
rx sent

## 2012-01-31 ENCOUNTER — Other Ambulatory Visit: Payer: Self-pay

## 2012-01-31 MED ORDER — ROSUVASTATIN CALCIUM 40 MG PO TABS: ORAL_TABLET | ORAL | Status: DC

## 2012-04-17 ENCOUNTER — Ambulatory Visit (INDEPENDENT_AMBULATORY_CARE_PROVIDER_SITE_OTHER): Payer: BC Managed Care – PPO | Admitting: Internal Medicine

## 2012-04-17 ENCOUNTER — Encounter: Payer: Self-pay | Admitting: Internal Medicine

## 2012-04-17 VITALS — BP 126/72 | HR 66 | Temp 97.6°F | Resp 12 | Ht 64.5 in | Wt 170.8 lb

## 2012-04-17 DIAGNOSIS — E785 Hyperlipidemia, unspecified: Secondary | ICD-10-CM

## 2012-04-17 DIAGNOSIS — Z0000 Encounter for general adult medical examination without abnormal findings: Secondary | ICD-10-CM

## 2012-04-17 DIAGNOSIS — Z23 Encounter for immunization: Secondary | ICD-10-CM

## 2012-04-17 LAB — POCT URINALYSIS DIPSTICK
Bilirubin, UA: NEGATIVE
Ketones, UA: NEGATIVE
Leukocytes, UA: NEGATIVE
Spec Grav, UA: <=1.005
pH, UA: 7.5

## 2012-04-17 LAB — HEPATIC FUNCTION PANEL
AST: 34 U/L (ref 0–37)
Albumin: 4.6 g/dL (ref 3.5–5.2)
Total Bilirubin: 1.5 mg/dL — ABNORMAL HIGH (ref 0.3–1.2)

## 2012-04-17 LAB — LIPID PANEL
Cholesterol: 186 mg/dL (ref 0–200)
HDL: 45.1 mg/dL (ref >39.00)
Triglycerides: 95 mg/dL (ref 0.0–149.0)
VLDL: 19 mg/dL (ref 0.0–40.0)

## 2012-04-17 LAB — CBC WITH DIFFERENTIAL/PLATELET
Basophils Absolute: 0 10*3/uL (ref 0.0–0.1)
Eosinophils Absolute: 0.1 10*3/uL (ref 0.0–0.7)
HCT: 39.9 % (ref 36.0–46.0)
Lymphs Abs: 1.7 10*3/uL (ref 0.7–4.0)
MCHC: 33.7 g/dL (ref 30.0–36.0)
MCV: 85.4 fl (ref 78.0–100.0)
Monocytes Absolute: 0.5 10*3/uL (ref 0.1–1.0)
Neutrophils Relative %: 58.7 % (ref 43.0–77.0)
Platelets: 215 10*3/uL (ref 150.0–400.0)
RDW: 13.5 % (ref 11.5–14.6)

## 2012-04-17 LAB — BASIC METABOLIC PANEL
BUN: 12 mg/dL (ref 6–23)
Chloride: 100 mEq/L (ref 96–112)
Creatinine, Ser: 0.6 mg/dL (ref 0.4–1.2)
Glucose, Bld: 84 mg/dL (ref 70–99)
Potassium: 3.4 mEq/L — ABNORMAL LOW (ref 3.5–5.1)

## 2012-04-17 LAB — HEMOGLOBIN A1C: Hgb A1c MFr Bld: 6.2 % (ref 4.6–6.5)

## 2012-04-17 MED ORDER — ROSUVASTATIN CALCIUM 40 MG PO TABS: ORAL_TABLET | ORAL | Status: DC

## 2012-04-17 NOTE — Patient Instructions (Addendum)
Preventive Health Care: Cardiovascular exercise, this can be as simple a program as walking, is recommended 30-45 minutes 3-4 times per week. If you're not exercising you should take 6-8 weeks to build up to this level.  Eat a low-fat diet with lots of fruits and vegetables, up to 7-9 servings per day.  Consume less than 30 grams of sugar per day from foods & drinks with High Fructose Corn Syrup as #1,2,3 or #4 on label. Health Care Power of Attorney & Living Will place you in charge of your health care  decisions. Verify these are  in place. If you activate My Chart; the results can be released to you as soon as they populate from the lab. If you choose not to use this program; the labs have to be reviewed, copied & mailed   causing a delay in getting the results to you.

## 2012-04-17 NOTE — Progress Notes (Signed)
  Subjective:    Patient ID: Lindsey Barrett, female    DOB: 02/03/1946, 66 y.o.   MRN: [id number]  HPI  Lindsey Barrett is here for a physical;  acute issues  Include dysuria terminally.      Review of Systems She is on a heart healthy diet; she is not exercising regularly. She is compliant with her statin. She denies associated abdominal pain or bowel changes. She has no chest pain, palpitations, dyspnea, or claudication.  Advanced cholesterol testing reveals that her LDL goal is less than 100, ideally less than 70. There is no family history of premature coronary disease.     Objective:   Physical Exam Gen.:  well-nourished in appearance. Alert, appropriate and cooperative throughout exam. Head: Normocephalic without obvious abnormalities  Eyes: No corneal or conjunctival inflammation noted. Pupils equal round reactive to light and accommodation. Fundal exam is benign without hemorrhages, exudate, papilledema. Extraocular motion intact. Vision grossly normal. Ears: External  ear exam reveals no significant lesions or deformities. Canals clear .TMs normal. Hearing is grossly normal bilaterally. Nose: External nasal exam reveals no deformity or inflammation. Nasal mucosa are pink and moist. No lesions or exudates noted.   Mouth: Oral mucosa and oropharynx reveal no lesions or exudates. Teeth in good repair. Neck: No deformities, masses, or tenderness noted. Range of motion decreased. Thyroid normal. Lungs: Normal respiratory effort; chest expands symmetrically. Lungs are clear to auscultation without rales, wheezes, or increased work of breathing. Heart: Normal rate and rhythm. Normal S1 and S2. No gallop,  or rub. LLSB click w/o MR murmur. Abdomen: Bowel sounds normal; abdomen soft and nontender. No masses, organomegaly or hernias noted. Genitalia: Dr Eda Paschal                                                               Musculoskeletal/extremities: No deformity or scoliosis noted of  the  thoracic or lumbar spine. No clubbing, cyanosis, edema, or deformity noted. Range of motion  normal .Tone & strength  normal.Joints normal. Nail health  good. Vascular: Carotid, radial artery, dorsalis pedis and  posterior tibial pulses are full and equal. No bruits present. Neurologic: Alert and oriented x3. Deep tendon reflexes symmetrical and normal.          Skin: Intact without suspicious lesions or rashes. Lymph: No cervical, axillary lymphadenopathy present. Psych: Mood and affect are normal. Normally interactive                                                                                         Assessment & Plan:  #1 comprehensive physical exam; no acute findings #2 see Problem List with Assessments & Recommendations Plan: see Orders

## 2012-04-25 ENCOUNTER — Encounter: Payer: Self-pay | Admitting: Internal Medicine

## 2012-04-28 ENCOUNTER — Telehealth: Payer: Self-pay | Admitting: *Deleted

## 2012-04-28 NOTE — Telephone Encounter (Signed)
Pt left VM stating that she has yet to get result of labs. Left message to call office to advise Pt that labs were release to mychart. Copy of labs mailed to Pt 04-28-12.  Entered by Pecola Lawless, MD at 04/18/2012 5:57 PM Based on your prior advanced testing, your LDL goal is < 100 , ideally < 70. Your present LDL increases long term heart attack or stroke risk 20 %.The best dietary information on cholesterol is Dr Gildardo Griffes book Eat, Drink & Be Healthy. The only other option would be to add Zetia 10 mg daily to your present dose of Crestor 40 mg.  I see no medications on your list which could cause potassium depletion. To increase Potassium (K+) increase citrus fruits & bananas in diet and use the salt substitute No Salt, which contains potassium , to season food @ the table. Recheck K+ after 4 weeks . PLEASE BRING THESE INSTRUCTIONS TO FOLLOW UP LAB APPOINTMENT.This will guarantee correct labs are drawn, eliminating need for repeat blood sampling ( needle sticks ! ). Diagnoses /Codes: 276.8.  The total bilirubin elevation is called Gilbert's Syndrome & is not significant. It is not indicative of liver disease; the total bilirubin will simply fluctuate slightly without any associated clinical symptoms or signs.  All other labs are excellent. Thank you for using My Chart; it has served [place] well. [employer]

## 2012-04-29 NOTE — Telephone Encounter (Signed)
Discuss with patient will await copy in mail.

## 2012-06-27 ENCOUNTER — Telehealth: Payer: Self-pay | Admitting: *Deleted

## 2012-06-27 NOTE — Telephone Encounter (Signed)
Pt left VM that her insurance sent her a letter stating she was due for pneumonia vaccine, Pt would like to know if she needs to get one.Please advise

## 2012-06-28 NOTE — Telephone Encounter (Signed)
Once after 67

## 2012-06-30 NOTE — Telephone Encounter (Signed)
Left message to call office

## 2012-07-02 ENCOUNTER — Ambulatory Visit (INDEPENDENT_AMBULATORY_CARE_PROVIDER_SITE_OTHER): Payer: Medicare Other | Admitting: *Deleted

## 2012-07-02 DIAGNOSIS — Z23 Encounter for immunization: Secondary | ICD-10-CM | POA: Diagnosis not present

## 2012-07-14 ENCOUNTER — Other Ambulatory Visit: Payer: Self-pay | Admitting: Internal Medicine

## 2012-07-15 NOTE — Telephone Encounter (Signed)
Records show pt had pneumonia vaccine on 07-02-12, she is age 67.

## 2012-07-17 ENCOUNTER — Other Ambulatory Visit: Payer: Self-pay | Admitting: Internal Medicine

## 2012-07-17 DIAGNOSIS — E785 Hyperlipidemia, unspecified: Secondary | ICD-10-CM

## 2012-07-17 NOTE — Telephone Encounter (Signed)
Refill for crestor sent to [facility] pharmacy

## 2012-08-15 ENCOUNTER — Encounter: Payer: Self-pay | Admitting: Gynecology

## 2012-08-15 ENCOUNTER — Ambulatory Visit (INDEPENDENT_AMBULATORY_CARE_PROVIDER_SITE_OTHER): Payer: Medicare Other | Admitting: Gynecology

## 2012-08-15 VITALS — BP 120/76 | Ht 65.0 in | Wt 175.0 lb

## 2012-08-15 DIAGNOSIS — N952 Postmenopausal atrophic vaginitis: Secondary | ICD-10-CM

## 2012-08-15 DIAGNOSIS — E559 Vitamin D deficiency, unspecified: Secondary | ICD-10-CM

## 2012-08-15 DIAGNOSIS — R300 Dysuria: Secondary | ICD-10-CM

## 2012-08-15 NOTE — Progress Notes (Signed)
Lindsey Barrett 28-Jan-1946 [id number]        67 y.o.  G0P0 for followup exam.   Doing well. Former patient of Dr. Eda Paschal.  Past medical history,surgical history, medications, allergies, family history and social history were all reviewed and documented in the EPIC chart. ROS:  Was performed and pertinent positives and negatives are included in the history.  Exam: Kim assistant Filed Vitals:   08/15/12 0856  BP: 120/76  Height: 5\' 5"  (1.651 m)  Weight: 175 lb (79.379 kg)   General appearance  Normal Skin grossly normal Head/Neck normal with no cervical or supraclavicular adenopathy thyroid normal Lungs  clear Cardiac RR, without RMG Abdominal  soft, nontender, without masses, organomegaly or hernia Breasts  examined lying and sitting without masses, retractions, discharge or axillary adenopathy. Pelvic  Ext/BUS/vagina  normal with atrophic changes  Cervix  normal with atrophic changes  Uterus  anteverted, normal size, shape and contour, midline and mobile nontender   Adnexa  Without masses or tenderness    Anus and perineum  normal   Rectovaginal  normal sphincter tone without palpated masses or tenderness.    Assessment/Plan:  67 y.o. G0P0 female for follow up exam.   1. Atrophic vaginitis. Patient does have vaginal dryness. Currently using Replens although not consistently. Reviewed options to include using Replens moisturizer consistently. Alternatives include estrogen vaginal cream, Vagifem, Osphena, full HRT discussed. Risks benefits to include increased risk of stroke heart attack DVT breast cancer reviewed. Patient is comfortable with continuing Replens and will use it more consistently. 2. Vitamin D deficiency. Had vitamin D checked previously at 16. Is on supplemental 2000 units daily with recheck at 47 in August 2013. We'll continue on her 2000 units daily. Recommend next time she has blood work done at Dr. Frederik Pear office she had a vitamin D level checked and she  agrees to do so. 3. Dysuria. Patient has very minimal occasional stinging with urination but does not last. Currently is not having symptoms. We'll check baseline UA. 4. Mammography 04/2012. Continue with annual mammography. SBE monthly reviewed. 5. Pap smear 08/2011. No Pap smear done today. No history of abnormal Pap smears. Options to stop screening altogether if she 67 versus less frequent screening intervals reviewed. We'll readdress on an annual basis. 6. DEXA 2011 normal. Increase calcium vitamin D reviewed. Plan repeat DEXA at 5 year interval. 7. Colonoscopy 2007. Repeated there recommended interval. 8. Health maintenance. No blood work done as it is all done through Dr. Frederik Pear office. Follow up in one year, sooner as needed.    Dara Lords MD, 9:32 AM 08/15/2012

## 2012-08-16 LAB — URINALYSIS W MICROSCOPIC + REFLEX CULTURE
Bacteria, UA: NONE SEEN
Casts: NONE SEEN
Crystals: NONE SEEN
Glucose, UA: NEGATIVE mg/dL
Ketones, ur: NEGATIVE mg/dL
Leukocytes, UA: NEGATIVE
Nitrite: NEGATIVE
Specific Gravity, Urine: 1.006 (ref 1.005–1.030)
pH: 7 (ref 5.0–8.0)

## 2012-08-21 ENCOUNTER — Encounter: Payer: Self-pay | Admitting: Obstetrics and Gynecology

## 2012-08-24 ENCOUNTER — Other Ambulatory Visit: Payer: Self-pay | Admitting: Internal Medicine

## 2012-10-10 ENCOUNTER — Other Ambulatory Visit: Payer: Self-pay | Admitting: Internal Medicine

## 2012-10-10 NOTE — Telephone Encounter (Signed)
Please make an office visit for further refills  

## 2012-10-13 ENCOUNTER — Other Ambulatory Visit: Payer: Self-pay | Admitting: Internal Medicine

## 2012-10-15 ENCOUNTER — Telehealth: Payer: Self-pay | Admitting: Internal Medicine

## 2012-10-15 MED ORDER — RANITIDINE HCL 150 MG PO TABS: 150.0000 mg | ORAL_TABLET | Freq: Two times a day (BID) | ORAL | Status: DC

## 2012-10-15 NOTE — Telephone Encounter (Signed)
Additional refills have been send to pt's pharmacy pt has been notified

## 2012-10-23 ENCOUNTER — Encounter: Payer: Self-pay | Admitting: Family Medicine

## 2012-10-23 ENCOUNTER — Ambulatory Visit (INDEPENDENT_AMBULATORY_CARE_PROVIDER_SITE_OTHER): Payer: Medicare Other | Admitting: Family Medicine

## 2012-10-23 VITALS — BP 110/72 | HR 78 | Temp 98.5°F | Wt 173.2 lb

## 2012-10-23 DIAGNOSIS — J069 Acute upper respiratory infection, unspecified: Secondary | ICD-10-CM | POA: Diagnosis not present

## 2012-10-23 DIAGNOSIS — J029 Acute pharyngitis, unspecified: Secondary | ICD-10-CM | POA: Diagnosis not present

## 2012-10-23 LAB — POCT RAPID STREP A (OFFICE): Rapid Strep A Screen: NEGATIVE

## 2012-10-23 MED ORDER — MOMETASONE FUROATE 50 MCG/ACT NA SUSP: 2.0000 | Freq: Every day | NASAL | Status: DC

## 2012-10-23 NOTE — Patient Instructions (Addendum)

## 2012-10-23 NOTE — Progress Notes (Signed)
  Subjective:     Lindsey Barrett is a 67 y.o. female here for evaluation of a cough. Onset of symptoms was 1-2 weeks ago. Symptoms have been gradually worsening since that time. The cough is dry and is aggravated by dust and fumes. Associated symptoms include: postnasal drip. Patient does not have a history of asthma. Patient does not have a history of environmental allergens. Patient has not traveled recently. Patient does not have a history of smoking. Patient has not had a previous chest x-ray. Patient has not had a PPD done.  The following portions of the patient's history were reviewed and updated as appropriate: allergies, current medications, past family history, past medical history, past social history, past surgical history and problem list.  Review of Systems Pertinent items are noted in HPI.    Objective:    Oxygen saturation 99% on room air BP 110/72  Pulse 78  Temp(Src) 98.5 F (36.9 C) (Oral)  Wt 173 lb 3.2 oz (78.563 kg)  BMI 28.82 kg/m2  SpO2 99% General appearance: alert, cooperative, appears stated age and no distress Ears: normal TM's and external ear canals both ears Nose: clear discharge, mild congestion, turbinates red, swollen Throat: abnormal findings: pnd Neck: no adenopathy, supple, symmetrical, trachea midline and thyroid not enlarged, symmetric, no tenderness/mass/nodules Lungs: clear to auscultation bilaterally Heart: S1, S2 normal    Assessment:    URI with Post Nasal Drip    Plan:    Explained lack of efficacy of antibiotics in viral disease. Avoid exposure to tobacco smoke and fumes. Call if shortness of breath worsens, blood in sputum, change in character of cough, development of fever or chills, inability to maintain nutrition and hydration. Avoid exposure to tobacco smoke and fumes. Trial of antihistamines. Trial of steroid nasal spray.

## 2012-10-23 NOTE — Addendum Note (Signed)
Addended by: Arnette Norris on: 10/23/2012 05:07 PM   Modules accepted: Orders

## 2012-11-20 DIAGNOSIS — L821 Other seborrheic keratosis: Secondary | ICD-10-CM | POA: Diagnosis not present

## 2012-11-20 DIAGNOSIS — L919 Hypertrophic disorder of the skin, unspecified: Secondary | ICD-10-CM | POA: Diagnosis not present

## 2012-11-20 DIAGNOSIS — D239 Other benign neoplasm of skin, unspecified: Secondary | ICD-10-CM | POA: Diagnosis not present

## 2013-01-08 DIAGNOSIS — H2510 Age-related nuclear cataract, unspecified eye: Secondary | ICD-10-CM | POA: Diagnosis not present

## 2013-03-12 ENCOUNTER — Other Ambulatory Visit: Payer: Self-pay

## 2013-03-31 ENCOUNTER — Telehealth: Payer: Self-pay | Admitting: Internal Medicine

## 2013-03-31 MED ORDER — ROSUVASTATIN CALCIUM 40 MG PO TABS: 40.0000 mg | ORAL_TABLET | Freq: Every day | ORAL | Status: DC

## 2013-03-31 NOTE — Telephone Encounter (Signed)
Patient called and scheduled a phys for 05/26/2012 @9 :30am. She was concerned that she will run out of her crestor in December before her January apt and wanted to make sure dr hopper would refill it for her.

## 2013-03-31 NOTE — Telephone Encounter (Signed)
Rx sent to the pharmacy by e-script.  LM @ (3:05pm) informing the pt that her Crestor was refilled.//AB/CMA

## 2013-04-15 ENCOUNTER — Encounter: Payer: Self-pay | Admitting: Gynecology

## 2013-04-15 DIAGNOSIS — Z1231 Encounter for screening mammogram for malignant neoplasm of breast: Secondary | ICD-10-CM | POA: Diagnosis not present

## 2013-05-22 ENCOUNTER — Telehealth: Payer: Self-pay

## 2013-05-22 NOTE — Telephone Encounter (Addendum)
Left message for call back Non identifiable  Medication and allergies:  Reviewed and updated  90 day supply/mail order: na [facility]   Immunizations due:  Admin flu vaccine at appt  A/P:   No changes to [place], [place] or Personal Hx Flu vaccine--at visit  Tdap--07/2010 PNA--06/2012 Shingles--06/2010 Pap--neg--08/2011--Dr Gottesegn [facility]--04/2013--neg Bone Density--08/2012 CCS--06/2005--[facility]--neg--next 2017 Endoscopy--2012--[facility]--neg  To Discuss with Provider: PCP

## 2013-05-26 ENCOUNTER — Encounter: Payer: Self-pay | Admitting: Internal Medicine

## 2013-05-26 ENCOUNTER — Ambulatory Visit (INDEPENDENT_AMBULATORY_CARE_PROVIDER_SITE_OTHER): Payer: Medicare Other | Admitting: Internal Medicine

## 2013-05-26 VITALS — BP 105/62 | HR 65 | Temp 98.4°F | Ht 64.0 in | Wt 176.4 lb

## 2013-05-26 DIAGNOSIS — Z23 Encounter for immunization: Secondary | ICD-10-CM

## 2013-05-26 DIAGNOSIS — K219 Gastro-esophageal reflux disease without esophagitis: Secondary | ICD-10-CM

## 2013-05-26 DIAGNOSIS — E559 Vitamin D deficiency, unspecified: Secondary | ICD-10-CM

## 2013-05-26 DIAGNOSIS — Z0000 Encounter for general adult medical examination without abnormal findings: Secondary | ICD-10-CM

## 2013-05-26 DIAGNOSIS — E785 Hyperlipidemia, unspecified: Secondary | ICD-10-CM

## 2013-05-26 DIAGNOSIS — R7309 Other abnormal glucose: Secondary | ICD-10-CM

## 2013-05-26 LAB — BASIC METABOLIC PANEL
BUN: 10 mg/dL (ref 6–23)
CHLORIDE: 104 meq/L (ref 96–112)
CO2: 29 meq/L (ref 19–32)
CREATININE: 0.6 mg/dL (ref 0.4–1.2)
Calcium: 8.7 mg/dL (ref 8.4–10.5)
GFR: 102.39 mL/min (ref >60.00)
GLUCOSE: 90 mg/dL (ref 70–99)
POTASSIUM: 3.9 meq/L (ref 3.5–5.1)
Sodium: 140 mEq/L (ref 135–145)

## 2013-05-26 LAB — CBC WITH DIFFERENTIAL/PLATELET
Basophils Absolute: 0 10*3/uL (ref 0.0–0.1)
Basophils Relative: 0.4 % (ref 0.0–3.0)
EOS ABS: 0.2 10*3/uL (ref 0.0–0.7)
EOS PCT: 3 % (ref 0.0–5.0)
HEMATOCRIT: 39.4 % (ref 36.0–46.0)
Hemoglobin: 13 g/dL (ref 12.0–15.0)
LYMPHS ABS: 1.4 10*3/uL (ref 0.7–4.0)
Lymphocytes Relative: 26.6 % (ref 12.0–46.0)
MCHC: 32.9 g/dL (ref 30.0–36.0)
MCV: 87.1 fl (ref 78.0–100.0)
MONO ABS: 0.4 10*3/uL (ref 0.1–1.0)
Monocytes Relative: 8.4 % (ref 3.0–12.0)
NEUTROS PCT: 61.6 % (ref 43.0–77.0)
Neutro Abs: 3.3 10*3/uL (ref 1.4–7.7)
PLATELETS: 216 10*3/uL (ref 150.0–400.0)
RBC: 4.52 Mil/uL (ref 3.87–5.11)
RDW: 14 % (ref 11.5–14.6)
WBC: 5.3 10*3/uL (ref 4.5–10.5)

## 2013-05-26 LAB — LIPID PANEL
CHOLESTEROL: 190 mg/dL (ref 0–200)
HDL: 47.6 mg/dL (ref >39.00)
LDL CALC: 124 mg/dL — AB (ref 0–99)
TRIGLYCERIDES: 94 mg/dL (ref 0.0–149.0)
Total CHOL/HDL Ratio: 4
VLDL: 18.8 mg/dL (ref 0.0–40.0)

## 2013-05-26 LAB — CK: Total CK: 96 U/L (ref 7–177)

## 2013-05-26 LAB — HEPATIC FUNCTION PANEL
ALBUMIN: 4.1 g/dL (ref 3.5–5.2)
ALT: 21 U/L (ref 0–35)
AST: 25 U/L (ref 0–37)
Alkaline Phosphatase: 53 U/L (ref 39–117)
BILIRUBIN TOTAL: 1.3 mg/dL — AB (ref 0.3–1.2)
Bilirubin, Direct: 0.1 mg/dL (ref 0.0–0.3)
Total Protein: 7.2 g/dL (ref 6.0–8.3)

## 2013-05-26 LAB — HEMOGLOBIN A1C: HEMOGLOBIN A1C: 6.1 % (ref 4.6–6.5)

## 2013-05-26 LAB — TSH: TSH: 1.42 u[IU]/mL (ref 0.35–5.50)

## 2013-05-26 NOTE — Patient Instructions (Addendum)
Your next office appointment will be determined based upon review of your pending labs . Those instructions will be transmitted to you through My Chart  . Roll the affected foot over a tennis ball 20 times twice a day. After this soak the foot in warm Epsom salts for 15-20 minutes. Wear arch supports in both shoes. Dr  Charlann Boxer if symptoms persist.

## 2013-05-26 NOTE — Progress Notes (Signed)
Pre visit review using our clinic review tool, if applicable. No additional management support is needed unless otherwise documented below in the visit note. 

## 2013-05-26 NOTE — Progress Notes (Signed)
Subjective:    Patient ID: Lindsey Barrett, female    DOB: 10-14-1945, 68 y.o.   MRN: [id number]  HPI Medicare Wellness Visit: Psychosocial and medical history were reviewed as required by Medicare (history related to abuse, antisocial behavior , firearm risk). Social history: Caffeine:no  , Alcohol: 4 beers / week  , Tobacco UDJ:SHFW 2000 Exercise:see below Personal safety/fall risk:no Limitations of activities of daily living:no Seatbelt/ smoke alarm use:yes Healthcare Power of Attorney/Living Will status: pending Ophthalmologic exam status:UTD Hearing evaluation status:see exam Orientation: Oriented X 3 Memory and recall: good Spelling testing: good Depression/anxiety assessment: no Foreign travel history:2000 [place] Immunization status for influenza/pneumonia/ shingles /tetanus:flu today Transfusion history:no Preventive health care maintenance status: Colonoscopy/BMD/mammogram/Pap as per protocol/standard care:UTD Dental care:every 6 mos Chart reviewed and updated. Active issues reviewed and addressed as documented below.    Review of Systems A heart healthy diet is followed; minimal exercise. Family history is negative for premature coronary disease.Gen.: Healthy and well-nourished in appearance. Advanced cholesterol testing reveals  LDL goal is less than 100 ; ideally < 70 . There is medication compliance with the statin.  Two low dose ASA taken Specifically denied are  chest pain, palpitations, dyspnea, or claudication.  Significant abdominal symptoms, memory deficit, or myalgias not present.      Objective:   Physical Exam   Alert, appropriate and cooperative throughout exam. Appears younger than stated age  Head: Normocephalic without obvious abnormalities Eyes: No corneal or conjunctival inflammation noted. Pupils equal round reactive to light and accommodation. Extraocular motion intact. Fundi benign Ears: External  ear exam reveals no significant lesions or  deformities. Canals moderate cerumen noted bilaterally.TMs normal. Hearing is grossly normal bilaterally. Nose: External nasal exam reveals no deformity or inflammation. Nasal mucosa are pink and moist. No lesions or exudates noted.   Mouth: Oral mucosa and oropharynx reveal no lesions or exudates. Teeth in good repair. Neck: No deformities, masses, or tenderness noted. Range of motion & Thyroid normal. Lungs: Normal respiratory effort; chest expands symmetrically. Lungs are clear to auscultation without rales, wheezes, or increased work of breathing. Heart: Normal rate and rhythm. Normal S1 and S2; soft click @ apex; no rub.  Abdomen: Bowel sounds normal; abdomen soft and nontender. No masses, organomegaly or hernias noted. Genitalia: as per Gyn                                  Musculoskeletal/extremities: No deformity or scoliosis noted of  the thoracic or lumbar spine.  No clubbing, cyanosis, edema, or significant extremity  deformity noted. Range of motion normal .Tone & strength normal  Hand joints normal OR reveal mild  DJD DIP changes. Fingernail / toenail health good. Able to lie down & sit up w/o help. Negative SLR bilaterally Vascular: Carotid, radial artery, dorsalis pedis and  posterior tibial pulses are full and equal. No bruits present. Neurologic: Alert and oriented x3. Deep tendon reflexes symmetrical but 0+.         Skin: Intact without suspicious lesions or rashes. Lymph: No cervical, axillary lymphadenopathy present. Psych: Mood and affect are normal. Normally interactive  Assessment & Plan:  .#1 Medicare Wellness Exam; criteria met ; data entered #2 Problem List/Diagnoses reviewed Plan:  Assessments made/ Orders entered

## 2013-05-29 LAB — VITAMIN D 1,25 DIHYDROXY
Vitamin D 1, 25 (OH)2 Total: 58 pg/mL (ref 18–72)
Vitamin D2 1, 25 (OH)2: <8 pg/mL
Vitamin D3 1, 25 (OH)2: 58 pg/mL

## 2013-07-28 ENCOUNTER — Other Ambulatory Visit: Payer: Self-pay

## 2013-07-28 MED ORDER — ROSUVASTATIN CALCIUM 40 MG PO TABS: 40.0000 mg | ORAL_TABLET | Freq: Every day | ORAL | Status: DC

## 2013-07-28 NOTE — Telephone Encounter (Signed)
Refilled crestor 40 mg

## 2013-08-17 ENCOUNTER — Encounter (HOSPITAL_BASED_OUTPATIENT_CLINIC_OR_DEPARTMENT_OTHER): Payer: Self-pay | Admitting: Emergency Medicine

## 2013-08-17 ENCOUNTER — Emergency Department (HOSPITAL_BASED_OUTPATIENT_CLINIC_OR_DEPARTMENT_OTHER)
Admission: EM | Admit: 2013-08-17 | Discharge: 2013-08-17 | Disposition: A | Discharge status: Home / Self Care | Payer: Medicare Other | Attending: Emergency Medicine | Admitting: Emergency Medicine

## 2013-08-17 DIAGNOSIS — Z79899 Other long term (current) drug therapy: Secondary | ICD-10-CM | POA: Diagnosis not present

## 2013-08-17 DIAGNOSIS — S61209A Unspecified open wound of unspecified finger without damage to nail, initial encounter: Secondary | ICD-10-CM | POA: Diagnosis not present

## 2013-08-17 DIAGNOSIS — E785 Hyperlipidemia, unspecified: Secondary | ICD-10-CM | POA: Diagnosis not present

## 2013-08-17 DIAGNOSIS — K219 Gastro-esophageal reflux disease without esophagitis: Secondary | ICD-10-CM | POA: Insufficient documentation

## 2013-08-17 DIAGNOSIS — Z87891 Personal history of nicotine dependence: Secondary | ICD-10-CM | POA: Insufficient documentation

## 2013-08-17 DIAGNOSIS — Z8739 Personal history of other diseases of the musculoskeletal system and connective tissue: Secondary | ICD-10-CM | POA: Diagnosis not present

## 2013-08-17 DIAGNOSIS — Z7982 Long term (current) use of aspirin: Secondary | ICD-10-CM | POA: Diagnosis not present

## 2013-08-17 DIAGNOSIS — Y9389 Activity, other specified: Secondary | ICD-10-CM | POA: Insufficient documentation

## 2013-08-17 DIAGNOSIS — Z8619 Personal history of other infectious and parasitic diseases: Secondary | ICD-10-CM | POA: Insufficient documentation

## 2013-08-17 DIAGNOSIS — Z8659 Personal history of other mental and behavioral disorders: Secondary | ICD-10-CM | POA: Diagnosis not present

## 2013-08-17 DIAGNOSIS — W278XXA Contact with other nonpowered hand tool, initial encounter: Secondary | ICD-10-CM | POA: Insufficient documentation

## 2013-08-17 DIAGNOSIS — Z8742 Personal history of other diseases of the female genital tract: Secondary | ICD-10-CM | POA: Insufficient documentation

## 2013-08-17 DIAGNOSIS — Y929 Unspecified place or not applicable: Secondary | ICD-10-CM | POA: Insufficient documentation

## 2013-08-17 NOTE — ED Provider Notes (Signed)
CSN: [id number]     Arrival date & time 08/17/13  1629 History   First MD Initiated Contact with Patient 08/17/13 1639     Chief Complaint  Patient presents with  . Laceration     (Consider location/radiation/quality/duration/timing/severity/associated sxs/prior Treatment) HPI Comments: Patient presents to the emergency department with chief complaint of finger tip injury. She states that she was using scissors today, when she cut the distal portion of her left fourth finger with scissors. She immediately applied gauze, and came to the emergency department for evaluation. She takes an aspirin daily. She is not on any other blood thinners. She states the pain is mild. There no aggravating or alleviating factors. Her tetanus shot is up-to-date.  The history is provided by the patient. No language interpreter was used.    Past Medical History  Diagnosis Date  . Fasting hyperglycemia   . DDD (degenerative disc disease)   . Gilbert syndrome   . Breast cyst     x3  . Hyperlipidemia   . Anxiety   . GERD (gastroesophageal reflux disease)   . Diverticulosis   . HSV infection   . Atrophic vaginitis   . STD (sexually transmitted disease)     HSV   Past Surgical History  Procedure Laterality Date  . Appendectomy    . Breast cyst aspiration      x2 right ,x1 left  . Wisdom tooth extraction    . Dilation and curettage of uterus    . Colonoscopy w/ biopsies  10/2010    Dr [name]  . Pelvic laparoscopy     Family History  Problem Relation Age of Onset  . Lung cancer Mother     smoker  . Endometriosis Mother   . Cancer Mother     Bladder cancer  . Liver cancer Maternal Uncle   . Stomach cancer Maternal Grandmother   . Stroke Paternal Grandfather 75  . Leukemia Maternal Uncle   . Breast cancer Paternal Aunt     great paternal aunt-Age 12's  . Colon cancer Paternal Aunt     great paternal aunt  . Coronary artery disease Father   . Cancer Father     intestinal carcinoid tumors   . Sudden death Sister     51 week old, congenital deformity  . Vascular Disease Paternal Grandmother     thoracic aneurysm  . Diabetes Neg Hx    History  Substance Use Topics  . Smoking status: Former Smoker    Types: Cigarettes    Quit date: 03/28/1999  . Smokeless tobacco: Never Used     Comment: smoked 1967-2000, up to 3/4 ppd  . Alcohol Use: 2.4 oz/week    4 Cans of beer per week   OB History   Grav Para Term Preterm Abortions TAB SAB Ect Mult Living   0              Review of Systems  Musculoskeletal: Negative for arthralgias and myalgias.  Skin: Positive for wound. Negative for color change, pallor and rash.  Neurological: Negative for weakness and numbness.      Allergies  Pravastatin sodium; Procaine hcl; Acetaminophen; Fish oil; and Prednisone  Home Medications   Current Outpatient Rx  Name  Route  Sig  Dispense  Refill  . aspirin 81 MG tablet   Oral   Take 162 mg by mouth as directed. 2 by mouth daily         . Ergocalciferol (VITAMIN D2) 2000 UNITS TABS  Oral   Take 1 tablet by mouth daily.         . ranitidine (ZANTAC) 150 MG tablet   Oral   Take 1 tablet (150 mg total) by mouth 2 (two) times daily.   180 tablet   6   . rosuvastatin (CRESTOR) 40 MG tablet   Oral   Take 1 tablet (40 mg total) by mouth daily.   30 tablet   11   . Vaginal Lubricant (REPLENS VA)   Vaginal   Place vaginally.          There were no vitals taken for this visit. Physical Exam  Nursing note and vitals reviewed. Constitutional: She is oriented to person, place, and time. She appears well-developed and well-nourished.  HENT:  Head: Normocephalic and atraumatic.  Eyes: Conjunctivae and EOM are normal.  Neck: Normal range of motion.  Cardiovascular: Normal rate.   Pulmonary/Chest: Effort normal.  Abdominal: She exhibits no distension.  Musculoskeletal: Normal range of motion.  Neurological: She is alert and oriented to person, place, and time.  Skin:  Skin is dry.  Left fourth digit remarkable for a very shallow avulsion injury to the distal tip, no nail or bone involvement, bleeding is controlled  Psychiatric: She has a normal mood and affect. Her behavior is normal. Judgment and thought content normal.    ED Course  Procedures (including critical care time) Labs Review Labs Reviewed - No data to display Imaging Review No results found.   EKG Interpretation None      MDM   Final diagnoses:  Finger avulsion    Patient with simple avulsion injury to the distal fourth left finger. Wound care, and wound care precautions. Recommend PCP followup in one week. Tetanus is up-to-date. Patient is stable and ready for discharge.    Montine Circle, PA-C 08/17/13 1659

## 2013-08-17 NOTE — ED Provider Notes (Signed)
Medical screening examination/treatment/procedure(s) were performed by non-physician practitioner and as supervising physician I was immediately available for consultation/collaboration.   EKG Interpretation None        Malvin Johns, MD 08/17/13 6283

## 2013-08-17 NOTE — Discharge Instructions (Signed)
Finger Avulsion  When the tip of the finger is lost, a new nail may grow back if part of the fingernail is left. The new nail may be deformed. If just the tip of the finger is lost, no repair may be needed unless there is bone showing. If bone is showing, your caregiver may need to remove the protruding bone and put on a bandage. Your caregiver will do what is best for you. Most of the time when a fingertip is lost, the end will gradually grow back on and look fairly normal, but it may remain sensitive to pressure and temperature extremes for a long time. HOME CARE INSTRUCTIONS   Keep your hand elevated above your heart to relieve pain and swelling.  Keep your dressing dry and clean.  Change your bandage in 24 hours or as directed.  Only take over-the-counter or prescription medicines for pain, discomfort, or fever as directed by your caregiver.  See your caregiver as needed for problems. SEEK MEDICAL CARE IF:   You have increased pain, swelling, drainage, or bleeding.  You have a fever.  You have swelling that spreads from your finger and into your hand. Make sure to check to see if you need a tetanus booster. Document Released: 07/02/2001 Document Revised: 10/23/2011 Document Reviewed: 05/27/2008 [facility] Patient Information 2014 [place], [place].

## 2013-08-17 NOTE — ED Notes (Signed)
Avulsion to the tip of her left 4th digit with a pair of scissors.

## 2013-08-28 DIAGNOSIS — L989 Disorder of the skin and subcutaneous tissue, unspecified: Secondary | ICD-10-CM | POA: Diagnosis not present

## 2013-08-28 DIAGNOSIS — L570 Actinic keratosis: Secondary | ICD-10-CM | POA: Diagnosis not present

## 2013-08-28 DIAGNOSIS — L819 Disorder of pigmentation, unspecified: Secondary | ICD-10-CM | POA: Diagnosis not present

## 2013-09-04 ENCOUNTER — Encounter: Payer: Self-pay | Admitting: Gynecology

## 2013-09-04 ENCOUNTER — Ambulatory Visit (INDEPENDENT_AMBULATORY_CARE_PROVIDER_SITE_OTHER): Payer: Medicare Other | Admitting: Gynecology

## 2013-09-04 VITALS — BP 120/76 | Ht 64.0 in | Wt 172.0 lb

## 2013-09-04 DIAGNOSIS — N952 Postmenopausal atrophic vaginitis: Secondary | ICD-10-CM

## 2013-09-04 DIAGNOSIS — E559 Vitamin D deficiency, unspecified: Secondary | ICD-10-CM

## 2013-09-04 NOTE — Patient Instructions (Signed)
Follow up in one year, sooner as needed. 

## 2013-09-04 NOTE — Progress Notes (Signed)
Lindsey Barrett 08-20-1945 [id number]        68 y.o.  G0P0 for followup exam.  Several issues noted below.  Past medical history,surgical history, problem list, medications, allergies, family history and social history were all reviewed and documented as reviewed in the EPIC chart.  ROS:  12 system ROS performed with pertinent positives and negatives included in the history, assessment and plan.  Included Systems: General, HEENT, Neck, Cardiovascular, Pulmonary, Gastrointestinal, Genitourinary, Musculoskeletal, Dermatologic, Endocrine, Hematological, Neurologic, Psychiatric Additional significant findings : None   Exam: Kim assistant Filed Vitals:   09/04/13 0849  BP: 120/76  Height: 5\' 4"  (1.626 m)  Weight: 172 lb (78.019 kg)   General appearance:  Normal affect, orientation and appearance. Skin: Grossly normal HEENT: Without gross lesions.  No cervical or supraclavicular adenopathy. Thyroid normal.  Lungs:  Clear without wheezing, rales or rhonchi Cardiac: RR, without RMG Abdominal:  Soft, nontender, without masses, guarding, rebound, organomegaly or hernia Breasts:  Examined lying and sitting without masses, retractions, discharge or axillary adenopathy. Pelvic:  Ext/BUS/vagina with generalized atrophic changes  Cervix atrophic  Uterus axial, normal size, shape and contour, midline and mobile nontender   Adnexa  Without masses or tenderness    Anus and perineum  Normal   Rectovaginal  Normal sphincter tone without palpated masses or tenderness.    Assessment/Plan:  68 y.o. G0P0 female for annual exam.   1. Postmenopausal/atrophic genital changes. Not having significant hot flashes or night sweats. Not sexually active. Is having issues with vaginal dryness. Uses OTC Replens. We talked about options to include vaginal estrogen or Osphena and she declines. We'll continue with OTC products. 2. History of vitamin D deficiency. Check vitamin D level today. She is on 2000 units  supplement daily. DEXA normal 2011. Plan repeat next year at five-year interval. 3. Pap smear 2013. The Pap smear done today. No history of abnormal Pap smears. Discussed current screening guidelines and options to stop screening versus less frequent screening intervals reviewed. Patient prefers to continue screening at this point and will plan repeat Pap smear next year. 4. Mammography 04/2013. Continue with annual mammography. SBE monthly reviewed. 5. Colonoscopy 2012. Repeat at their recommended interval. 6. Health maintenance. No routine blood work done as Dr Linna Darner does this. Followup one year, sooner as needed.   Note: This document was prepared with digital dictation and possible smart phrase technology. Any transcriptional errors that result from this process are unintentional.   [name] MD, 9:21 AM 09/04/2013

## 2013-09-05 LAB — URINALYSIS W MICROSCOPIC + REFLEX CULTURE
Bacteria, UA: NONE SEEN
Bilirubin Urine: NEGATIVE
CASTS: NONE SEEN
CRYSTALS: NONE SEEN
GLUCOSE, UA: NEGATIVE mg/dL
Hgb urine dipstick: NEGATIVE
Ketones, ur: NEGATIVE mg/dL
Leukocytes, UA: NEGATIVE
Nitrite: NEGATIVE
PH: 7.5 (ref 5.0–8.0)
Protein, ur: NEGATIVE mg/dL
SQUAMOUS EPITHELIAL / LPF: NONE SEEN
Specific Gravity, Urine: 1.011 (ref 1.005–1.030)
Urobilinogen, UA: 0.2 mg/dL (ref 0.0–1.0)

## 2013-09-05 LAB — VITAMIN D 25 HYDROXY (VIT D DEFICIENCY, FRACTURES): Vit D, 25-Hydroxy: 51 ng/mL (ref 30–89)

## 2013-09-07 ENCOUNTER — Encounter: Payer: Self-pay | Admitting: Gynecology

## 2013-09-14 DIAGNOSIS — K130 Diseases of lips: Secondary | ICD-10-CM | POA: Diagnosis not present

## 2013-11-10 ENCOUNTER — Other Ambulatory Visit: Payer: Self-pay | Admitting: Internal Medicine

## 2013-11-12 ENCOUNTER — Telehealth: Payer: Self-pay | Admitting: Internal Medicine

## 2013-11-12 MED ORDER — RANITIDINE HCL 150 MG PO TABS: 150.0000 mg | ORAL_TABLET | Freq: Two times a day (BID) | ORAL | Status: DC

## 2013-11-12 NOTE — Telephone Encounter (Signed)
Rx sent. Patient must keep 01/05/14 office visit for further refills.

## 2013-11-26 DIAGNOSIS — D239 Other benign neoplasm of skin, unspecified: Secondary | ICD-10-CM | POA: Diagnosis not present

## 2013-11-26 DIAGNOSIS — L570 Actinic keratosis: Secondary | ICD-10-CM | POA: Diagnosis not present

## 2013-11-26 DIAGNOSIS — Z808 Family history of malignant neoplasm of other organs or systems: Secondary | ICD-10-CM | POA: Diagnosis not present

## 2013-11-26 DIAGNOSIS — L821 Other seborrheic keratosis: Secondary | ICD-10-CM | POA: Diagnosis not present

## 2014-01-05 ENCOUNTER — Ambulatory Visit: Payer: Medicare Other | Admitting: Internal Medicine

## 2014-01-05 ENCOUNTER — Encounter: Payer: Self-pay | Admitting: Internal Medicine

## 2014-01-05 ENCOUNTER — Ambulatory Visit (INDEPENDENT_AMBULATORY_CARE_PROVIDER_SITE_OTHER): Payer: Medicare Other | Admitting: Internal Medicine

## 2014-01-05 VITALS — BP 108/60 | HR 76 | Ht 64.25 in | Wt 173.1 lb

## 2014-01-05 DIAGNOSIS — K219 Gastro-esophageal reflux disease without esophagitis: Secondary | ICD-10-CM

## 2014-01-05 MED ORDER — RANITIDINE HCL 150 MG PO TABS: 150.0000 mg | ORAL_TABLET | Freq: Two times a day (BID) | ORAL | Status: DC

## 2014-01-05 NOTE — Progress Notes (Signed)
SITLALI KOERNER 31-Oct-1945 [id number]  Note: This dictation was prepared with Dragon digital system. Any transcriptional errors that result from this procedure are unintentional.   History of Present Illness: This is a 68 year old white female with gastroesophageal reflux currently asymptomatic on ranitidine 150 mg twice a day. She denies dysphagia, heartburn but has occasional indigestion for which she takes [place] or Rolaids. Last upper endoscopy in June 2012 showed changes consistent with chronic esophagitis but no evidence of Barrett's esophagus. Her colorectal screening is up-to-date. Last colonoscopy in 2007 showed moderately severe diverticulosis. She will be due for recall in February 2017.     Past Medical History  Diagnosis Date  . Fasting hyperglycemia   . DDD (degenerative disc disease)   . Gilbert syndrome   . Breast cyst     x3  . Hyperlipidemia   . Anxiety   . GERD (gastroesophageal reflux disease)   . Diverticulosis   . HSV infection   . Atrophic vaginitis   . STD (sexually transmitted disease)     HSV    Past Surgical History  Procedure Laterality Date  . Appendectomy    . Breast cyst aspiration      x2 right ,x1 left  . Wisdom tooth extraction    . Dilation and curettage of uterus    . Colonoscopy w/ biopsies  10/2010    Dr [name]  . Pelvic laparoscopy      Allergies  Allergen Reactions  . Pravastatin Sodium     Breasts swollen & tender  . Procaine Hcl Palpitations    Tachycardia from Procaine with Epinephrine  . Acetaminophen     Nausea & vomiting  . Fish Oil     gas  . Prednisone     Fear of potential adverse effects    Family history and social history have been reviewed.  Review of Systems: Negative for heartburn dysphagia weight loss  The remainder of the 10 point ROS is negative except as outlined in the H&P  Physical Exam: General Appearance Well developed, in no distress Eyes  Non icteric  HEENT  Non traumatic, normocephalic   Mouth No lesion, tongue papillated, no cheilosis Neck Supple without adenopathy, thyroid not enlarged, no carotid bruits, no JVD Lungs Clear to auscultation bilaterally COR Normal S1, normal S2, regular rhythm, no murmur, quiet precordium Abdomen soft nontender abdomen with normal active bowel sounds. No distention. Liver edge at costal margin. Lower abdomen unremarkable, post appendectomy scar Rectal soft Hemoccult negative stool Extremities  No pedal edema Skin No lesions Neurological Alert and oriented x 3 Psychological Normal mood and affect  Assessment and Plan:   68 year old white female with gastroesophageal reflux under good control with ranitidine 150 mg twice a day. I asked her to reduce the dose to one a day and take the second  ranitidine only as needed. Follow antireflux measures.  Colorectal screening. Recall colonoscopy in February 2017      Delfin Edis 01/05/2014

## 2014-01-05 NOTE — Patient Instructions (Addendum)
You will be due for a recall colonoscopy in 06/2015. We will send you a reminder in the mail when it gets closer to that time.  We have sent the following medications to your pharmacy for you to pick up at your convenience: Ranitidine Dr Linna Darner

## 2014-02-03 DIAGNOSIS — H2510 Age-related nuclear cataract, unspecified eye: Secondary | ICD-10-CM | POA: Diagnosis not present

## 2014-02-03 DIAGNOSIS — H40019 Open angle with borderline findings, low risk, unspecified eye: Secondary | ICD-10-CM | POA: Diagnosis not present

## 2014-02-05 ENCOUNTER — Other Ambulatory Visit: Payer: Self-pay | Admitting: Internal Medicine

## 2014-03-15 DIAGNOSIS — H4011X2 Primary open-angle glaucoma, moderate stage: Secondary | ICD-10-CM | POA: Diagnosis not present

## 2014-04-12 DIAGNOSIS — H4011X2 Primary open-angle glaucoma, moderate stage: Secondary | ICD-10-CM | POA: Diagnosis not present

## 2014-04-27 DIAGNOSIS — Z1231 Encounter for screening mammogram for malignant neoplasm of breast: Secondary | ICD-10-CM | POA: Diagnosis not present

## 2014-04-28 ENCOUNTER — Encounter: Payer: Self-pay | Admitting: Gynecology

## 2014-06-10 ENCOUNTER — Encounter: Payer: Self-pay | Admitting: Internal Medicine

## 2014-06-10 ENCOUNTER — Ambulatory Visit (INDEPENDENT_AMBULATORY_CARE_PROVIDER_SITE_OTHER): Payer: Medicare Other | Admitting: Internal Medicine

## 2014-06-10 VITALS — BP 120/80 | HR 65 | Temp 98.3°F | Resp 16 | Ht 64.0 in | Wt 175.1 lb

## 2014-06-10 DIAGNOSIS — E559 Vitamin D deficiency, unspecified: Secondary | ICD-10-CM | POA: Diagnosis not present

## 2014-06-10 DIAGNOSIS — K219 Gastro-esophageal reflux disease without esophagitis: Secondary | ICD-10-CM

## 2014-06-10 DIAGNOSIS — E785 Hyperlipidemia, unspecified: Secondary | ICD-10-CM | POA: Diagnosis not present

## 2014-06-10 DIAGNOSIS — R739 Hyperglycemia, unspecified: Secondary | ICD-10-CM

## 2014-06-10 DIAGNOSIS — R9431 Abnormal electrocardiogram [ECG] [EKG]: Secondary | ICD-10-CM | POA: Diagnosis not present

## 2014-06-10 NOTE — Patient Instructions (Addendum)
Your next office appointment will be determined based upon review of your pending labs . Those instructions will be transmitted to you through My Chart   Critical values will be called  Take the EKG to any emergency room or preop visits. There are nonspecific changes; as long as there is no new change these are not clinically significant . If the old EKG is not available for comparison; it may result in unnecessary hospitalization for observation with significant unnecessary expense.

## 2014-06-10 NOTE — Assessment & Plan Note (Signed)
NMR Lipoprofile, LFTs, TSH  

## 2014-06-10 NOTE — Assessment & Plan Note (Signed)
A1c

## 2014-06-10 NOTE — Progress Notes (Signed)
   Subjective:    Patient ID: Lindsey Barrett, female    DOB: June 20, 1945, 69 y.o.   MRN: [id number]  HPI The patient is here to assess status of active health conditions.  PMH, FH, & Social History reviewed & updated.  She is on a modified heart healthy diet some: She's not exercising at this time.  There is no family history of premature heart attack or stroke. Based on advanced cholesterol testing her LDL goal is less than 100.    Review of Systems  Significant headaches, epistaxis, chest pain, palpitations, exertional dyspnea, claudication, paroxysmal nocturnal dyspnea, or edema absent. No memory loss or myalgias Unexplained weight loss, abdominal pain, significant dyspepsia, dysphagia, melena, rectal bleeding, or persistently small caliber stools are denied.      Objective:   Physical Exam  Gen.: Adequately nourished in appearance. Alert, appropriate and cooperative throughout exam. Appears younger than stated age  Head: Normocephalic without obvious abnormalities  Eyes: No corneal or conjunctival inflammation noted. Pupils equal round reactive to light and accommodation. Extraocular motion intact.  Ears: External  ear exam reveals no significant lesions or deformities. Wax bilaterally but hearing is grossly normal bilaterally. Nose: External nasal exam reveals no deformity or inflammation. Nasal mucosa are pink and moist. No lesions or exudates noted.   Mouth: Oral mucosa and oropharynx reveal no lesions or exudates. Teeth in good repair. Neck: No deformities, masses, or tenderness noted. Range of motion & Thyroid normal. Lungs: Normal respiratory effort; chest expands symmetrically. Lungs are clear to auscultation without rales, wheezes, or increased work of breathing. Heart: Normal rate and rhythm. Normal S1 and S2. No gallop, click, or rub. No murmur. Abdomen: Bowel sounds normal; abdomen soft and nontender. No masses, organomegaly or hernias noted. Genitalia: as per Gyn                                   Musculoskeletal/extremities: No deformity or scoliosis noted of  the thoracic or lumbar spine.  No clubbing, cyanosis, edema, or significant extremity  deformity noted.  Range of motion normal . Tone & strength normal. Hand joints normal.  Fingernail  health good. Crepitus of knees  Able to lie down & sit up w/o help.  Negative SLR bilaterally Vascular: Carotid, radial artery, dorsalis pedis and  posterior tibial pulses are full and equal. No bruits present. Neurologic: Alert and oriented x3. Deep tendon reflexes symmetrical and normal.  Gait normal      Skin: Intact without suspicious lesions or rashes. Lymph: No cervical, axillary lymphadenopathy present. Psych: Mood and affect are normal. Normally interactive                                                                                      Assessment & Plan:  See Current Assessment & Plan in Problem List under specific Diagnosis

## 2014-06-10 NOTE — Progress Notes (Signed)
Pre visit review using our clinic review tool, if applicable. No additional management support is needed unless otherwise documented below in the visit note. 

## 2014-06-14 DIAGNOSIS — H4011X2 Primary open-angle glaucoma, moderate stage: Secondary | ICD-10-CM | POA: Diagnosis not present

## 2014-06-17 ENCOUNTER — Encounter: Payer: Self-pay | Admitting: Internal Medicine

## 2014-06-17 ENCOUNTER — Ambulatory Visit (INDEPENDENT_AMBULATORY_CARE_PROVIDER_SITE_OTHER): Payer: Medicare Other | Admitting: Internal Medicine

## 2014-06-17 VITALS — BP 120/66 | HR 80 | Temp 98.5°F | Ht 64.0 in | Wt 172.1 lb

## 2014-06-17 DIAGNOSIS — H4010X0 Unspecified open-angle glaucoma, stage unspecified: Secondary | ICD-10-CM | POA: Insufficient documentation

## 2014-06-17 DIAGNOSIS — R51 Headache: Secondary | ICD-10-CM | POA: Diagnosis not present

## 2014-06-17 DIAGNOSIS — H4010X4 Unspecified open-angle glaucoma, indeterminate stage: Secondary | ICD-10-CM

## 2014-06-17 DIAGNOSIS — R519 Headache, unspecified: Secondary | ICD-10-CM

## 2014-06-17 NOTE — Progress Notes (Signed)
   Subjective:    Patient ID: Lindsey Barrett, female    DOB: [birth year], 69 y.o.   MRN: [id number]  HPI  She is having intermittent atypical headaches manifested as some pain in the left eye, left temple, & left crown. Symptoms last 20 seconds or less and have been intermittent over the last 3 months.   Initially she questioned whether this might be related to Alphagan drops which she used the evening of 2/8 & the morning 2/9.  She has been diagnosed as having open-angle glaucoma.  There is no direct relationship temporarily between eyedrops administration & her symptoms. The most recent event occurred 11 hours after using the eyedrops. The most recent episode 2/9 was associated with some weakness which she described as "jumping beans in my bloodstream" and being "washed out".  She does have a past history of visual migraines manifested as an enlarging "trapezoid (shaped)" visual field deficit.These have been responsive to NSAIDS & rest.  There is no cardiac or neurologic prodrome prior to her symptoms. New. She has no sensory loss associated with the headaches.  Review of Systems  Denied were any change in heart rhythm or rate prior to the event. There was no associated chest pain or shortness of breath .  Also specifically denied prior to the episode were  limb weakness, tingling, or numbness. No seizure activity noted.     Objective:   Physical Exam  Pertinent or positive findings include: She has wax in the otic canal obscuring the tympanic membranes. The nasal septum is deviated to the right. Cranial nerve exam is normal. She has minimal nystagmus with extraocular motion. Field of vision is normal grossly. Strength, tone, deep tendon reflexes are normal. Romberg and finger-nose testing is negative. Gait to include heel and toe walking is normal.  General appearance :adequately nourished; in no distress. Eyes: No conjunctival inflammation or scleral icterus is  present. Oral exam: Dental hygiene is good. Lips and gums are healthy appearing.There is no oropharyngeal erythema or exudate noted.  Heart:  Normal rate and regular rhythm. S1 and S2 normal without gallop, murmur, click, rub or other extra sounds   Lungs:Chest clear to auscultation; no wheezes, rhonchi,rales ,or rubs present.No increased work of breathing. ascular : all pulses equal ; no bruits present. Skin:Warm & dry.  Intact without suspicious lesions or rashes ; no jaundice or tenting Lymphatic: No lymphadenopathy is noted about the head, neck, axilla      Assessment & Plan:  #1 atypical neurlagia type symptoms; doubt temporal arteritis #2 PMH visual migraines Plan: essential is headache diary to assess process optimally

## 2014-06-17 NOTE — Patient Instructions (Signed)

## 2014-06-17 NOTE — Progress Notes (Signed)
Pre visit review using our clinic review tool, if applicable. No additional management support is needed unless otherwise documented below in the visit note. 

## 2014-06-18 ENCOUNTER — Other Ambulatory Visit: Payer: Self-pay

## 2014-06-18 ENCOUNTER — Other Ambulatory Visit: Payer: Self-pay | Admitting: Internal Medicine

## 2014-06-18 ENCOUNTER — Other Ambulatory Visit (INDEPENDENT_AMBULATORY_CARE_PROVIDER_SITE_OTHER): Payer: Medicare Other

## 2014-06-18 DIAGNOSIS — E785 Hyperlipidemia, unspecified: Secondary | ICD-10-CM

## 2014-06-18 DIAGNOSIS — K219 Gastro-esophageal reflux disease without esophagitis: Secondary | ICD-10-CM

## 2014-06-18 DIAGNOSIS — R51 Headache: Secondary | ICD-10-CM | POA: Diagnosis not present

## 2014-06-18 DIAGNOSIS — R739 Hyperglycemia, unspecified: Secondary | ICD-10-CM

## 2014-06-18 DIAGNOSIS — R519 Headache, unspecified: Secondary | ICD-10-CM

## 2014-06-18 LAB — CBC WITH DIFFERENTIAL/PLATELET
Basophils Absolute: 0 10*3/uL (ref 0.0–0.1)
Basophils Relative: 0.5 % (ref 0.0–3.0)
Eosinophils Absolute: 0.2 10*3/uL (ref 0.0–0.7)
Eosinophils Relative: 3.9 % (ref 0.0–5.0)
HEMATOCRIT: 40.7 % (ref 36.0–46.0)
Hemoglobin: 13.4 g/dL (ref 12.0–15.0)
LYMPHS ABS: 2 10*3/uL (ref 0.7–4.0)
Lymphocytes Relative: 33.6 % (ref 12.0–46.0)
MCHC: 33 g/dL (ref 30.0–36.0)
MCV: 86 fl (ref 78.0–100.0)
MONO ABS: 0.6 10*3/uL (ref 0.1–1.0)
Monocytes Relative: 9.5 % (ref 3.0–12.0)
NEUTROS ABS: 3.2 10*3/uL (ref 1.4–7.7)
Neutrophils Relative %: 52.5 % (ref 43.0–77.0)
PLATELETS: 227 10*3/uL (ref 150.0–400.0)
RBC: 4.74 Mil/uL (ref 3.87–5.11)
RDW: 14.2 % (ref 11.5–15.5)
WBC: 6 10*3/uL (ref 4.0–10.5)

## 2014-06-18 LAB — HEPATIC FUNCTION PANEL
ALBUMIN: 4.2 g/dL (ref 3.5–5.2)
ALK PHOS: 60 U/L (ref 39–117)
ALT: 24 U/L (ref 0–35)
AST: 24 U/L (ref 0–37)
BILIRUBIN TOTAL: 1.1 mg/dL (ref 0.2–1.2)
Bilirubin, Direct: 0.2 mg/dL (ref 0.0–0.3)
Total Protein: 7.3 g/dL (ref 6.0–8.3)

## 2014-06-18 LAB — BASIC METABOLIC PANEL
BUN: 13 mg/dL (ref 6–23)
CALCIUM: 9.3 mg/dL (ref 8.4–10.5)
CO2: 33 meq/L — AB (ref 19–32)
Chloride: 103 mEq/L (ref 96–112)
Creatinine, Ser: 0.67 mg/dL (ref 0.40–1.20)
GFR: 93.32 mL/min (ref >60.00)
Glucose, Bld: 81 mg/dL (ref 70–99)
Potassium: 4 mEq/L (ref 3.5–5.1)
SODIUM: 141 meq/L (ref 135–145)

## 2014-06-18 LAB — HEMOGLOBIN A1C: Hgb A1c MFr Bld: 6.2 % (ref 4.6–6.5)

## 2014-06-18 LAB — SEDIMENTATION RATE: SED RATE: 19 mm/h (ref 0–22)

## 2014-06-18 LAB — TSH: TSH: 3.16 u[IU]/mL (ref 0.35–4.50)

## 2014-06-23 LAB — NMR LIPOPROFILE WITH LIPIDS
Cholesterol, Total: 216 mg/dL — ABNORMAL HIGH (ref 100–199)
HDL Particle Number: 35.8 umol/L (ref >=30.5)
HDL Size: 8.7 nm — ABNORMAL LOW (ref >=9.2)
HDL-C: 52 mg/dL (ref >39)
LARGE VLDL-P: 1.7 nmol/L (ref <=2.7)
LDL (calc): 142 mg/dL — ABNORMAL HIGH (ref 0–99)
LDL PARTICLE NUMBER: 2217 nmol/L — AB (ref <1000)
LDL SIZE: 19.9 nm (ref >=20.8)
LP-IR Score: 55 — ABNORMAL HIGH (ref <=45)
Large HDL-P: 4.8 umol/L (ref >=4.8)
Small LDL Particle Number: 1577 nmol/L — ABNORMAL HIGH (ref <=527)
TRIGLYCERIDES: 110 mg/dL (ref 0–149)
VLDL SIZE: 45.8 nm (ref <=46.6)

## 2014-07-02 DIAGNOSIS — H2513 Age-related nuclear cataract, bilateral: Secondary | ICD-10-CM | POA: Diagnosis not present

## 2014-07-02 DIAGNOSIS — H4011X2 Primary open-angle glaucoma, moderate stage: Secondary | ICD-10-CM | POA: Diagnosis not present

## 2014-07-27 DIAGNOSIS — H4011X2 Primary open-angle glaucoma, moderate stage: Secondary | ICD-10-CM | POA: Diagnosis not present

## 2014-08-02 ENCOUNTER — Other Ambulatory Visit: Payer: Self-pay | Admitting: Internal Medicine

## 2014-09-17 ENCOUNTER — Ambulatory Visit (INDEPENDENT_AMBULATORY_CARE_PROVIDER_SITE_OTHER): Payer: Medicare Other | Admitting: Internal Medicine

## 2014-09-17 ENCOUNTER — Ambulatory Visit (INDEPENDENT_AMBULATORY_CARE_PROVIDER_SITE_OTHER)
Admission: RE | Admit: 2014-09-17 | Discharge: 2014-09-17 | Disposition: A | Discharge status: Home / Self Care | Payer: Medicare Other | Source: Ambulatory Visit | Attending: Internal Medicine | Admitting: Internal Medicine

## 2014-09-17 VITALS — BP 130/68 | HR 70 | Temp 98.3°F | Ht 64.0 in | Wt 175.0 lb

## 2014-09-17 DIAGNOSIS — M79671 Pain in right foot: Secondary | ICD-10-CM

## 2014-09-17 DIAGNOSIS — M19071 Primary osteoarthritis, right ankle and foot: Secondary | ICD-10-CM | POA: Diagnosis not present

## 2014-09-17 NOTE — Progress Notes (Signed)
   Subjective:    Patient ID: Lindsey Barrett, female    DOB: [birth year], 69 y.o.   MRN: [id number]  HPI She's had pain in the dorsum of the right foot for 2-3 weeks without specific trigger/ injury. It is described as dull, to level IV. It is sore to touch. Foot palpation or walking does increase  discomfort. It is localized to the dorsum of the foot at the base of the toes especially the fourth toe. She's noted some swelling in this area with increased temperature.  She has no past history of gout.     Review of Systems  She has no fever, chills, sweats, change in weight.  There's been no redness or rash in area of pain.  She has no limb weakness. She has had some tingling in her ankle and foot.    Objective:   Physical Exam Pertinent or positive findings include: There is subtle swelling of the dorsum of the right foot. She's tender to palpation across the dorsum of the foot especially below the fourth right toe. She has a bunion at the base of the left great toe. 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  General appearance :adequately nourished; in no distress. Eyes: No conjunctival inflammation or scleral icterus is present. Heart:  Normal rate and regular rhythm. S1 and S2 normal without gallop, murmur, click, rub or other extra sounds   Lungs:Chest clear to auscultation; no wheezes, rhonchi,rales ,or rubs present.No increased work of breathing.  Vascular : all pulses equal ; no bruits present. Skin:Warm & dry.  Intact without suspicious lesions or rashes ; no tenting or jaundice  Lymphatic: No lymphadenopathy is noted about the head, neck, axilla.  Neuro: Strength, tone & DTRs normal.       Assessment & Plan:  #1 foot pain  See orders

## 2014-09-17 NOTE — Patient Instructions (Signed)
The wooden sandal will prevent excessive mobilization of the foot and help decrease pain.  Your next office appointment will be determined based upon review of your pending xrays  Those instructions will be transmitted to you by My Chart\ Critical results will be called.   Followup as needed for any active or acute issue. Please report any significant change in your symptoms.

## 2014-09-17 NOTE — Progress Notes (Signed)
Pre visit review using our clinic review tool, if applicable. No additional management support is needed unless otherwise documented below in the visit note. 

## 2014-09-18 ENCOUNTER — Encounter: Payer: Self-pay | Admitting: Internal Medicine

## 2014-10-14 ENCOUNTER — Encounter: Payer: Self-pay | Admitting: Gynecology

## 2014-10-14 ENCOUNTER — Other Ambulatory Visit (HOSPITAL_COMMUNITY)
Admission: RE | Admit: 2014-10-14 | Discharge: 2014-10-14 | Disposition: A | Discharge status: Home / Self Care | Payer: Medicare Other | Source: Ambulatory Visit | Attending: Gynecology | Admitting: Gynecology

## 2014-10-14 ENCOUNTER — Ambulatory Visit (INDEPENDENT_AMBULATORY_CARE_PROVIDER_SITE_OTHER): Payer: Medicare Other | Admitting: Gynecology

## 2014-10-14 VITALS — BP 120/64 | Ht 64.0 in | Wt 171.0 lb

## 2014-10-14 DIAGNOSIS — Z124 Encounter for screening for malignant neoplasm of cervix: Secondary | ICD-10-CM | POA: Diagnosis not present

## 2014-10-14 DIAGNOSIS — N952 Postmenopausal atrophic vaginitis: Secondary | ICD-10-CM

## 2014-10-14 DIAGNOSIS — Z01419 Encounter for gynecological examination (general) (routine) without abnormal findings: Secondary | ICD-10-CM | POA: Diagnosis not present

## 2014-10-14 DIAGNOSIS — R8299 Other abnormal findings in urine: Secondary | ICD-10-CM | POA: Diagnosis not present

## 2014-10-14 NOTE — Patient Instructions (Signed)
Follow up for bone density as scheduled.  You may obtain a copy of any labs that were done today by logging onto MyChart as outlined in the instructions provided with your AVS (after visit summary). The office will not call with normal lab results but certainly if there are any significant abnormalities then we will contact you.   Health Maintenance, Female A healthy lifestyle and preventative care can promote health and wellness.  Maintain regular health, dental, and eye exams.  Eat a healthy diet. Foods like vegetables, fruits, whole grains, low-fat dairy products, and lean protein foods contain the nutrients you need without too many calories. Decrease your intake of foods high in solid fats, added sugars, and salt. Get information about a proper diet from your caregiver, if necessary.  Regular physical exercise is one of the most important things you can do for your health. Most adults should get at least 150 minutes of moderate-intensity exercise (any activity that increases your heart rate and causes you to sweat) each week. In addition, most adults need muscle-strengthening exercises on 2 or more days a week.   Maintain a healthy weight. The body mass index (BMI) is a screening tool to identify possible weight problems. It provides an estimate of body fat based on height and weight. Your caregiver can help determine your BMI, and can help you achieve or maintain a healthy weight. For adults 20 years and older:  A BMI below 18.5 is considered underweight.  A BMI of 18.5 to 24.9 is normal.  A BMI of 25 to 29.9 is considered overweight.  A BMI of 30 and above is considered obese.  Maintain normal blood lipids and cholesterol by exercising and minimizing your intake of saturated fat. Eat a balanced diet with plenty of fruits and vegetables. Blood tests for lipids and cholesterol should begin at age 20 and be repeated every 5 years. If your lipid or cholesterol levels are high, you are over  50, or you are a high risk for heart disease, you may need your cholesterol levels checked more frequently.Ongoing high lipid and cholesterol levels should be treated with medicines if diet and exercise are not effective.  If you smoke, find out from your caregiver how to quit. If you do not use tobacco, do not start.  Lung cancer screening is recommended for adults aged 55 80 years who are at high risk for developing lung cancer because of a history of smoking. Yearly low-dose computed tomography (CT) is recommended for people who have at least a 30-pack-year history of smoking and are a current smoker or have quit within the past 15 years. A pack year of smoking is smoking an average of 1 pack of cigarettes a day for 1 year (for example: 1 pack a day for 30 years or 2 packs a day for 15 years). Yearly screening should continue until the smoker has stopped smoking for at least 15 years. Yearly screening should also be stopped for people who develop a health problem that would prevent them from having lung cancer treatment.  If you are pregnant, do not drink alcohol. If you are breastfeeding, be very cautious about drinking alcohol. If you are not pregnant and choose to drink alcohol, do not exceed 1 drink per day. One drink is considered to be 12 ounces (355 mL) of beer, 5 ounces (148 mL) of wine, or 1.5 ounces (44 mL) of liquor.  Avoid use of street drugs. Do not share needles with anyone. Ask for help   if you need support or instructions about stopping the use of drugs.  High blood pressure causes heart disease and increases the risk of stroke. Blood pressure should be checked at least every 1 to 2 years. Ongoing high blood pressure should be treated with medicines, if weight loss and exercise are not effective.  If you are 55 to 67 years old, ask your caregiver if you should take aspirin to prevent strokes.  Diabetes screening involves taking a blood sample to check your fasting blood sugar level.  This should be done once every 3 years, after age 45, if you are within normal weight and without risk factors for diabetes. Testing should be considered at a younger age or be carried out more frequently if you are overweight and have at least 1 risk factor for diabetes.  Breast cancer screening is essential preventative care for women. You should practice "breast self-awareness." This means understanding the normal appearance and feel of your breasts and may include breast self-examination. Any changes detected, no matter how small, should be reported to a caregiver. Women in their 20s and 30s should have a clinical breast exam (CBE) by a caregiver as part of a regular health exam every 1 to 3 years. After age 40, women should have a CBE every year. Starting at age 40, women should consider having a mammogram (breast X-ray) every year. Women who have a family history of breast cancer should talk to their caregiver about genetic screening. Women at a high risk of breast cancer should talk to their caregiver about having an MRI and a mammogram every year.  Breast cancer gene (BRCA)-related cancer risk assessment is recommended for women who have family members with BRCA-related cancers. BRCA-related cancers include breast, ovarian, tubal, and peritoneal cancers. Having family members with these cancers may be associated with an increased risk for harmful changes (mutations) in the breast cancer genes BRCA1 and BRCA2. Results of the assessment will determine the need for genetic counseling and BRCA1 and BRCA2 testing.  The Pap test is a screening test for cervical cancer. Women should have a Pap test starting at age 21. Between ages 21 and 29, Pap tests should be repeated every 2 years. Beginning at age 30, you should have a Pap test every 3 years as long as the past 3 Pap tests have been normal. If you had a hysterectomy for a problem that was not cancer or a condition that could lead to cancer, then you no  longer need Pap tests. If you are between ages 65 and 70, and you have had normal Pap tests going back 10 years, you no longer need Pap tests. If you have had past treatment for cervical cancer or a condition that could lead to cancer, you need Pap tests and screening for cancer for at least 20 years after your treatment. If Pap tests have been discontinued, risk factors (such as a new sexual partner) need to be reassessed to determine if screening should be resumed. Some women have medical problems that increase the chance of getting cervical cancer. In these cases, your caregiver may recommend more frequent screening and Pap tests.  The human papillomavirus (HPV) test is an additional test that may be used for cervical cancer screening. The HPV test looks for the virus that can cause the cell changes on the cervix. The cells collected during the Pap test can be tested for HPV. The HPV test could be used to screen women aged 30 years and older, and   should be used in women of any age who have unclear Pap test results. After the age of 30, women should have HPV testing at the same frequency as a Pap test.  Colorectal cancer can be detected and often prevented. Most routine colorectal cancer screening begins at the age of 50 and continues through age 75. However, your caregiver may recommend screening at an earlier age if you have risk factors for colon cancer. On a yearly basis, your caregiver may provide home test kits to check for hidden blood in the stool. Use of a small camera at the end of a tube, to directly examine the colon (sigmoidoscopy or colonoscopy), can detect the earliest forms of colorectal cancer. Talk to your caregiver about this at age 50, when routine screening begins. Direct examination of the colon should be repeated every 5 to 10 years through age 75, unless early forms of pre-cancerous polyps or small growths are found.  Hepatitis C blood testing is recommended for all people born from  1945 through 1965 and any individual with known risks for hepatitis C.  Practice safe sex. Use condoms and avoid high-risk sexual practices to reduce the spread of sexually transmitted infections (STIs). Sexually active women aged 25 and younger should be checked for Chlamydia, which is a common sexually transmitted infection. Older women with new or multiple partners should also be tested for Chlamydia. Testing for other STIs is recommended if you are sexually active and at increased risk.  Osteoporosis is a disease in which the bones lose minerals and strength with aging. This can result in serious bone fractures. The risk of osteoporosis can be identified using a bone density scan. Women ages 65 and over and women at risk for fractures or osteoporosis should discuss screening with their caregivers. Ask your caregiver whether you should be taking a calcium supplement or vitamin D to reduce the rate of osteoporosis.  Menopause can be associated with physical symptoms and risks. Hormone replacement therapy is available to decrease symptoms and risks. You should talk to your caregiver about whether hormone replacement therapy is right for you.  Use sunscreen. Apply sunscreen liberally and repeatedly throughout the day. You should seek shade when your shadow is shorter than you. Protect yourself by wearing long sleeves, pants, a wide-brimmed hat, and sunglasses year round, whenever you are outdoors.  Notify your caregiver of new moles or changes in moles, especially if there is a change in shape or color. Also notify your caregiver if a mole is larger than the size of a pencil eraser.  Stay current with your immunizations. Document Released: 11/06/2010 Document Revised: 08/18/2012 Document Reviewed: 11/06/2010 [employer] Patient Information 2014 [employer].   

## 2014-10-14 NOTE — Progress Notes (Signed)
SIERRAH LUEVANO 04/12/1946 [id number]        69 y.o.  G0P0 for breast and pelvic exam.  Past medical history,surgical history, problem list, medications, allergies, family history and social history were all reviewed and documented as reviewed in the EPIC chart.  ROS:  Performed with pertinent positives and negatives included in the history, assessment and plan.   Additional significant findings :  none   Exam: Kim Counsellor Vitals:   10/14/14 0851  BP: 120/64  Height: 5\' 4"  (1.626 m)  Weight: 171 lb (77.565 kg)   General appearance:  Normal affect, orientation and appearance. Skin: Grossly normal HEENT: Without gross lesions.  No cervical or supraclavicular adenopathy. Thyroid normal.  Lungs:  Clear without wheezing, rales or rhonchi Cardiac: RR, without RMG Abdominal:  Soft, nontender, without masses, guarding, rebound, organomegaly or hernia Breasts:  Examined lying and sitting without masses, retractions, discharge or axillary adenopathy. Pelvic:  Ext/BUS/vagina with atrophic changes  Cervix with atrophic changes. Pap smear done  Uterus anteverted, normal size, shape and contour, midline and mobile nontender   Adnexa  Without masses or tenderness    Anus and perineum  Normal   Rectovaginal  Normal sphincter tone without palpated masses or tenderness.    Assessment/Plan:  69 y.o. G0P0 female for breast and pelvic exam.   1. Postmenopausal/atrophic genital changes. Patient does have some hot flashes which are tolerable. Is using Replens regularly for vaginal dryness doing well with this. Is not sexually active. No vaginal bleeding. Continue to monitor report any vaginal bleeding. 2. Pap smear 2013. Pap smear done today. Options to stop screening as she is over the age 1 versus less frequent screening intervals reviewed. This point the patient feels uncomfortable stop screening. 3. Mammography 04/2014. Continue with annual mammography. SBE melter reviewed. 4. DEXA  2011 normal. Recommend repeat DEXA now five-year interval. Vitamin D level 51 last year. 5. Colonoscopy 2007. Planned repeat interval 10 years. 6. Health maintenance. No routine blood work done as patient has this done at her primary physician's office. Follow up in one year, sooner as needed.     [name] MD, 9:18 AM 10/14/2014

## 2014-10-14 NOTE — Addendum Note (Signed)
Addended by: Nelva Nay on: 10/14/2014 10:23 AM   Modules accepted: Orders

## 2014-10-15 LAB — URINALYSIS W MICROSCOPIC + REFLEX CULTURE
BILIRUBIN URINE: NEGATIVE
CASTS: NONE SEEN
Crystals: NONE SEEN
GLUCOSE, UA: NEGATIVE mg/dL
Hgb urine dipstick: NEGATIVE
Ketones, ur: NEGATIVE mg/dL
Leukocytes, UA: NEGATIVE
NITRITE: NEGATIVE
PROTEIN: NEGATIVE mg/dL
Squamous Epithelial / LPF: NONE SEEN
UROBILINOGEN UA: 0.2 mg/dL (ref 0.0–1.0)
pH: 7 (ref 5.0–8.0)

## 2014-10-15 LAB — CYTOLOGY - PAP

## 2014-10-17 LAB — URINE CULTURE: Colony Count: >=100000

## 2014-10-19 ENCOUNTER — Other Ambulatory Visit: Payer: Self-pay | Admitting: *Deleted

## 2014-10-19 MED ORDER — SULFAMETHOXAZOLE-TRIMETHOPRIM 800-160 MG PO TABS: 1.0000 | ORAL_TABLET | Freq: Two times a day (BID) | ORAL | Status: DC

## 2014-10-21 ENCOUNTER — Telehealth: Payer: Self-pay | Admitting: *Deleted

## 2014-10-21 MED ORDER — CIPROFLOXACIN HCL 250 MG PO TABS: 250.0000 mg | ORAL_TABLET | Freq: Two times a day (BID) | ORAL | Status: DC

## 2014-10-21 NOTE — Telephone Encounter (Signed)
Pt was prescribed Septra DS #6 x 3 days on 10/19/14 took first pill yesterday and broke out with hives on arms and buttocks. Pt called on call MD which was Dr.Miller and was told to stop Rx and take benadryl. I will put in her allergy list. Please advise

## 2014-10-21 NOTE — Telephone Encounter (Signed)
I would switch to ciprofloxacin 250 mg twice a day 3 days.

## 2014-10-21 NOTE — Telephone Encounter (Signed)
Rx sent  I called and left message on pt voicemail rx has been sent

## 2014-11-01 DIAGNOSIS — H40023 Open angle with borderline findings, high risk, bilateral: Secondary | ICD-10-CM | POA: Diagnosis not present

## 2014-11-18 ENCOUNTER — Ambulatory Visit (INDEPENDENT_AMBULATORY_CARE_PROVIDER_SITE_OTHER): Payer: Medicare Other

## 2014-11-18 ENCOUNTER — Other Ambulatory Visit: Payer: Self-pay | Admitting: Gynecology

## 2014-11-18 DIAGNOSIS — Z780 Asymptomatic menopausal state: Secondary | ICD-10-CM

## 2014-11-18 DIAGNOSIS — Z01419 Encounter for gynecological examination (general) (routine) without abnormal findings: Secondary | ICD-10-CM

## 2014-12-22 DIAGNOSIS — L723 Sebaceous cyst: Secondary | ICD-10-CM | POA: Diagnosis not present

## 2014-12-22 DIAGNOSIS — Z86018 Personal history of other benign neoplasm: Secondary | ICD-10-CM | POA: Diagnosis not present

## 2014-12-22 DIAGNOSIS — L821 Other seborrheic keratosis: Secondary | ICD-10-CM | POA: Diagnosis not present

## 2014-12-22 DIAGNOSIS — Z808 Family history of malignant neoplasm of other organs or systems: Secondary | ICD-10-CM | POA: Diagnosis not present

## 2014-12-22 DIAGNOSIS — D225 Melanocytic nevi of trunk: Secondary | ICD-10-CM | POA: Diagnosis not present

## 2014-12-25 ENCOUNTER — Emergency Department (HOSPITAL_BASED_OUTPATIENT_CLINIC_OR_DEPARTMENT_OTHER)
Admission: EM | Admit: 2014-12-25 | Discharge: 2014-12-25 | Disposition: A | Discharge status: Home / Self Care | Payer: Medicare Other | Attending: Emergency Medicine | Admitting: Emergency Medicine

## 2014-12-25 ENCOUNTER — Encounter (HOSPITAL_BASED_OUTPATIENT_CLINIC_OR_DEPARTMENT_OTHER): Payer: Self-pay | Admitting: Emergency Medicine

## 2014-12-25 DIAGNOSIS — Z8739 Personal history of other diseases of the musculoskeletal system and connective tissue: Secondary | ICD-10-CM | POA: Insufficient documentation

## 2014-12-25 DIAGNOSIS — R002 Palpitations: Secondary | ICD-10-CM | POA: Insufficient documentation

## 2014-12-25 DIAGNOSIS — E785 Hyperlipidemia, unspecified: Secondary | ICD-10-CM | POA: Diagnosis not present

## 2014-12-25 DIAGNOSIS — K219 Gastro-esophageal reflux disease without esophagitis: Secondary | ICD-10-CM | POA: Insufficient documentation

## 2014-12-25 DIAGNOSIS — H409 Unspecified glaucoma: Secondary | ICD-10-CM | POA: Diagnosis not present

## 2014-12-25 DIAGNOSIS — Z8659 Personal history of other mental and behavioral disorders: Secondary | ICD-10-CM | POA: Insufficient documentation

## 2014-12-25 DIAGNOSIS — Z8619 Personal history of other infectious and parasitic diseases: Secondary | ICD-10-CM | POA: Insufficient documentation

## 2014-12-25 DIAGNOSIS — Z87891 Personal history of nicotine dependence: Secondary | ICD-10-CM | POA: Insufficient documentation

## 2014-12-25 DIAGNOSIS — Z7982 Long term (current) use of aspirin: Secondary | ICD-10-CM | POA: Insufficient documentation

## 2014-12-25 DIAGNOSIS — Z79899 Other long term (current) drug therapy: Secondary | ICD-10-CM | POA: Diagnosis not present

## 2014-12-25 DIAGNOSIS — Z8742 Personal history of other diseases of the female genital tract: Secondary | ICD-10-CM | POA: Diagnosis not present

## 2014-12-25 LAB — CBC WITH DIFFERENTIAL/PLATELET
BASOS ABS: 0 10*3/uL (ref 0.0–0.1)
BASOS PCT: 0 % (ref 0–1)
EOS PCT: 3 % (ref 0–5)
Eosinophils Absolute: 0.2 10*3/uL (ref 0.0–0.7)
HCT: 40.3 % (ref 36.0–46.0)
Hemoglobin: 13 g/dL (ref 12.0–15.0)
LYMPHS PCT: 25 % (ref 12–46)
Lymphs Abs: 1.5 10*3/uL (ref 0.7–4.0)
MCH: 28.8 pg (ref 26.0–34.0)
MCHC: 32.3 g/dL (ref 30.0–36.0)
MCV: 89.4 fL (ref 78.0–100.0)
Monocytes Absolute: 0.6 10*3/uL (ref 0.1–1.0)
Monocytes Relative: 10 % (ref 3–12)
NEUTROS ABS: 3.7 10*3/uL (ref 1.7–7.7)
Neutrophils Relative %: 62 % (ref 43–77)
PLATELETS: 196 10*3/uL (ref 150–400)
RBC: 4.51 MIL/uL (ref 3.87–5.11)
RDW: 13.5 % (ref 11.5–15.5)
WBC: 6 10*3/uL (ref 4.0–10.5)

## 2014-12-25 LAB — BASIC METABOLIC PANEL
ANION GAP: 9 (ref 5–15)
BUN: 16 mg/dL (ref 6–20)
CALCIUM: 8.7 mg/dL — AB (ref 8.9–10.3)
CO2: 27 mmol/L (ref 22–32)
Chloride: 106 mmol/L (ref 101–111)
Creatinine, Ser: 0.57 mg/dL (ref 0.44–1.00)
GFR calc Af Amer: >60 mL/min (ref >60)
Glucose, Bld: 119 mg/dL — ABNORMAL HIGH (ref 65–99)
POTASSIUM: 3.8 mmol/L (ref 3.5–5.1)
SODIUM: 142 mmol/L (ref 135–145)

## 2014-12-25 LAB — TROPONIN I

## 2014-12-25 NOTE — ED Provider Notes (Signed)
CSN: [id number]     Arrival date & time 12/25/14  0250 History   First MD Initiated Contact with Patient 12/25/14 0407     Chief Complaint  Patient presents with  . Palpitations     (Consider location/radiation/quality/duration/timing/severity/associated sxs/prior Treatment) HPI  This is a 69 year old female with a history of acid reflux. When she gets episodes of acid reflux air usually accompanied by belching which relieves her symptoms. She has a history of a heart beat that is pounding when she lies on her left side. As a result she usually lies on her right side when sleeping. She felt her heart start pounding about midnight this morning. She found that lying on either side did not help. She states it was a heavy bleeding but not any irregular or rapid beating. There was no associated chest pain, shortness of breath, cough, nausea or vomiting. Symptoms are mild to moderate. They resolved on arrival in the ED. She has had belching this morning.  Past Medical History  Diagnosis Date  . Fasting hyperglycemia   . DDD (degenerative disc disease)   . Gilbert syndrome   . Breast cyst     x3  . Hyperlipidemia   . Anxiety   . GERD (gastroesophageal reflux disease)   . Diverticulosis   . HSV infection   . Atrophic vaginitis   . STD (sexually transmitted disease)     HSV  . Glaucoma    Past Surgical History  Procedure Laterality Date  . Appendectomy    . Breast cyst aspiration      x2 right ,x1 left  . Wisdom tooth extraction    . Dilation and curettage of uterus    . Upper gi endoscopy  10/2010    Dr [name]  . Pelvic laparoscopy    . Colonoscopy  2007    neg   Family History  Problem Relation Age of Onset  . Lung cancer Mother     smoker  . Endometriosis Mother   . Cancer Mother     Bladder cancer  . Liver cancer Maternal Uncle   . Stomach cancer Maternal Grandmother   . Stroke Paternal Grandfather 55  . Leukemia Maternal Uncle   . Breast cancer Paternal Aunt    great paternal aunt-Age 66's  . Colon cancer Paternal Aunt     great paternal aunt  . Coronary artery disease Father   . Cancer Father     intestinal carcinoid tumors  . Sudden death Sister     48 week old, congenital deformity  . Vascular Disease Paternal Grandmother     thoracic aneurysm  . Diabetes Neg Hx    Social History  Substance Use Topics  . Smoking status: Former Smoker    Types: Cigarettes    Quit date: 03/28/1999  . Smokeless tobacco: Never Used     Comment: smoked 1967-2000, up to 3/4 ppd  . Alcohol Use: 4.2 oz/week    7 Standard drinks or equivalent per week     Comment:  7/ week   OB History    Gravida Para Term Preterm AB TAB SAB Ectopic Multiple Living   0              Review of Systems  All other systems reviewed and are negative.   Allergies  Pravastatin sodium; Procaine hcl; Septra ds; Alphagan p; Acetaminophen; Fish oil; and Prednisone  Home Medications   Prior to Admission medications   Medication Sig Start Date End Date Taking? Authorizing  Provider  aspirin 81 MG tablet Take 81 mg by mouth as directed.    Yes Historical Provider, MD  bimatoprost (LUMIGAN) 0.01 % SOLN Place 1 drop into both eyes at bedtime.   Yes Historical Provider, MD  CRESTOR 40 MG tablet TAKE ONE TABLET BY MOUTH ONCE DAILY 08/02/14  Yes Hendricks Limes, MD  Ergocalciferol (VITAMIN D2) 2000 UNITS TABS Take 1 tablet by mouth daily.   Yes Historical Provider, MD  ranitidine (ZANTAC) 150 MG tablet Take 1 tablet (150 mg total) by mouth 2 (two) times daily. 01/05/14  Yes Lafayette Dragon, MD  Vaginal Lubricant (REPLENS [facility]) Place vaginally.   Yes Historical Provider, MD   BP 163/79 mmHg  Pulse 76  Temp(Src) 98.3 F (36.8 C) (Oral)  Resp 18  Ht 5\' 4"  (1.626 m)  Wt 170 lb (77.111 kg)  BMI 29.17 kg/m2  SpO2 98%   Physical Exam  General: Well-developed, well-nourished female in no acute distress; appearance consistent with age of record HENT: normocephalic; atraumatic Eyes: pupils  equal, round and reactive to light; extraocular muscles intact Neck: supple Heart: regular rate and rhythm; no murmurs, rubs or gallops Lungs: clear to auscultation bilaterally Abdomen: soft; nondistended; nontender; no masses or hepatosplenomegaly; bowel sounds present Extremities: No deformity; full range of motion; pulses normal Neurologic: Awake, alert and oriented; motor function intact in all extremities and symmetric; no facial droop Skin: Warm and dry Psychiatric: Normal mood and affect   ED Course  Procedures (including critical care time)   EKG Interpretation   Date/Time:  Saturday December 25 2014 03:05:25 EDT Ventricular Rate:  70 PR Interval:  162 QRS Duration: 90 QT Interval:  432 QTC Calculation: 466 R Axis:   -12 Text Interpretation:  Normal sinus rhythm Nonspecific T wave abnormality  Abnormal ECG No significant change was found Confirmed by Khaleel Beckom  MD, Jenny Reichmann  [phone]) on 12/25/2014 3:24:06 AM      MDM  Nursing notes and vitals signs, including pulse oximetry, reviewed.  Summary of this visit's results, reviewed by myself:  Labs:  Results for orders placed or performed during the hospital encounter of 12/25/14 (from the past 24 hour(s))  Troponin I     Status: None   Collection Time: 12/25/14  4:20 AM  Result Value Ref Range   Troponin I <0.03 <0.031 ng/mL  Basic metabolic panel     Status: Abnormal   Collection Time: 12/25/14  4:20 AM  Result Value Ref Range   Sodium 142 135 - 145 mmol/L   Potassium 3.8 3.5 - 5.1 mmol/L   Chloride 106 101 - 111 mmol/L   CO2 27 22 - 32 mmol/L   Glucose, Bld 119 (H) 65 - 99 mg/dL   BUN 16 6 - 20 mg/dL   Creatinine, Ser 0.57 0.44 - 1.00 mg/dL   Calcium 8.7 (L) 8.9 - 10.3 mg/dL   GFR calc non Af Amer >60 >60 mL/min   GFR calc Af Amer >60 >60 mL/min   Anion gap 9 5 - 15  CBC with Differential/Platelet     Status: None   Collection Time: 12/25/14  4:20 AM  Result Value Ref Range   WBC 6.0 4.0 - 10.5 K/uL   RBC 4.51  3.87 - 5.11 MIL/uL   Hemoglobin 13.0 12.0 - 15.0 g/dL   HCT 40.3 36.0 - 46.0 %   MCV 89.4 78.0 - 100.0 fL   MCH 28.8 26.0 - 34.0 pg   MCHC 32.3 30.0 - 36.0 g/dL  RDW 13.5 11.5 - 15.5 %   Platelets 196 150 - 400 K/uL   Neutrophils Relative % 62 43 - 77 %   Neutro Abs 3.7 1.7 - 7.7 K/uL   Lymphocytes Relative 25 12 - 46 %   Lymphs Abs 1.5 0.7 - 4.0 K/uL   Monocytes Relative 10 3 - 12 %   Monocytes Absolute 0.6 0.1 - 1.0 K/uL   Eosinophils Relative 3 0 - 5 %   Eosinophils Absolute 0.2 0.0 - 0.7 K/uL   Basophils Relative 0 0 - 1 %   Basophils Absolute 0.0 0.0 - 0.1 K/uL   5:31 AM Patient's rhythm strip for her ED visit reviewed. No ectopy or arrhythmia seen.    Shanon Rosser, MD 12/25/14 [phone]

## 2014-12-25 NOTE — ED Notes (Signed)
Pt states that when she lies on her left side her heart pounds, tonight it pounded no matter what side she was on.

## 2015-01-26 DIAGNOSIS — H04123 Dry eye syndrome of bilateral lacrimal glands: Secondary | ICD-10-CM | POA: Diagnosis not present

## 2015-01-26 DIAGNOSIS — H4011X2 Primary open-angle glaucoma, moderate stage: Secondary | ICD-10-CM | POA: Diagnosis not present

## 2015-01-26 DIAGNOSIS — H52223 Regular astigmatism, bilateral: Secondary | ICD-10-CM | POA: Diagnosis not present

## 2015-01-26 DIAGNOSIS — H524 Presbyopia: Secondary | ICD-10-CM | POA: Diagnosis not present

## 2015-01-26 DIAGNOSIS — H2513 Age-related nuclear cataract, bilateral: Secondary | ICD-10-CM | POA: Diagnosis not present

## 2015-01-26 DIAGNOSIS — H5213 Myopia, bilateral: Secondary | ICD-10-CM | POA: Diagnosis not present

## 2015-02-08 ENCOUNTER — Other Ambulatory Visit: Payer: Self-pay | Admitting: Internal Medicine

## 2015-02-11 ENCOUNTER — Telehealth: Payer: Self-pay | Admitting: Internal Medicine

## 2015-02-11 ENCOUNTER — Telehealth: Payer: Self-pay | Admitting: *Deleted

## 2015-02-11 MED ORDER — RANITIDINE HCL 150 MG PO TABS: 150.0000 mg | ORAL_TABLET | Freq: Two times a day (BID) | ORAL | Status: DC

## 2015-02-11 NOTE — Telephone Encounter (Signed)
Patient called in for a refill on Ranitidine, patient was scheduled for an appointment with Dr. Havery Moros on 02/14/15 at 3:00 pm and medication was sent in to the pharmacy for a 1 month supply #60.

## 2015-02-11 NOTE — Telephone Encounter (Signed)
Left msg on triage stating her crestor has went up $ over 100, and she can't afford to pay that. Was told there is a generic she can take. Requesting md to rx something else...Lindsey Barrett

## 2015-02-14 ENCOUNTER — Ambulatory Visit (INDEPENDENT_AMBULATORY_CARE_PROVIDER_SITE_OTHER): Payer: Medicare Other | Admitting: Gastroenterology

## 2015-02-14 ENCOUNTER — Encounter: Payer: Self-pay | Admitting: Internal Medicine

## 2015-02-14 ENCOUNTER — Encounter: Payer: Self-pay | Admitting: Gastroenterology

## 2015-02-14 VITALS — BP 112/60 | HR 77 | Ht 64.0 in | Wt 170.0 lb

## 2015-02-14 DIAGNOSIS — Z1211 Encounter for screening for malignant neoplasm of colon: Secondary | ICD-10-CM | POA: Diagnosis not present

## 2015-02-14 DIAGNOSIS — K219 Gastro-esophageal reflux disease without esophagitis: Secondary | ICD-10-CM | POA: Diagnosis not present

## 2015-02-14 DIAGNOSIS — R140 Abdominal distension (gaseous): Secondary | ICD-10-CM

## 2015-02-14 MED ORDER — RANITIDINE HCL 150 MG PO TABS: 150.0000 mg | ORAL_TABLET | Freq: Two times a day (BID) | ORAL | Status: DC

## 2015-02-14 MED ORDER — ROSUVASTATIN CALCIUM 40 MG PO TABS: 40.0000 mg | ORAL_TABLET | Freq: Every day | ORAL | Status: DC

## 2015-02-14 NOTE — Telephone Encounter (Signed)
Notified pt with md response. Sent generic crestor to [facility]...Lindsey Barrett

## 2015-02-14 NOTE — Patient Instructions (Signed)
Please Start Low FODMAP diet.  We have sent the following medications to your pharmacy for you to pick up at your convenience: Zantac

## 2015-02-14 NOTE — Progress Notes (Signed)
HPI :  69 y/o female, former patient of Dr. [name], new to me, here for follow up for reflux. She has had symptoms of reflux for years. Symptoms have historically included "fullness" sensation in her lower chest and upper abdomen. Generally she has had good control of symptoms for years while taking Zantac, usually takes it twice daily. She has been on PPI previously and felt "weird" and did not like taking it, but cannot remember the specific reaction. No dysphagia. No odynophagia. No routine nausea or vomiting. No postprandial pain. She has some bloating otherwise that occasionally bothers her after she eats, often related to specific trigger foods. She had one episode of severe bloating that was associated with with some palpitations. She had an EGD 2012 showed an irregular z-line which was biopsied and negative for BE. Otherwise no significant hiatal hernia. She has not had unintentional weight loss.   Otherwise last Colonoscopy was in Feb 2007 was normal other than diverticulosis. She denies any problems with her bowels at this time. No diarrhea, no constipation, no blood in the stools. Due for recall in Feb 2017.   Past Medical History  Diagnosis Date  . Fasting hyperglycemia   . DDD (degenerative disc disease)   . Gilbert syndrome   . Breast cyst     x3  . Hyperlipidemia   . Anxiety   . GERD (gastroesophageal reflux disease)   . Diverticulosis   . HSV infection   . Atrophic vaginitis   . STD (sexually transmitted disease)     HSV  . Glaucoma      Past Surgical History  Procedure Laterality Date  . Appendectomy    . Breast cyst aspiration      x2 right ,x1 left  . Wisdom tooth extraction    . Dilation and curettage of uterus    . Upper gi endoscopy  10/2010    Dr [name]  . Pelvic laparoscopy    . Colonoscopy  2007    neg   Family History  Problem Relation Age of Onset  . Lung cancer Mother     smoker  . Endometriosis Mother   . Cancer Mother     Bladder cancer  .  Liver cancer Maternal Uncle   . Stomach cancer Maternal Grandmother   . Stroke Paternal Grandfather 62  . Leukemia Maternal Uncle   . Breast cancer Paternal Aunt     great paternal aunt-Age 33's  . Colon cancer Paternal Aunt     great paternal aunt  . Coronary artery disease Father   . Cancer Father     intestinal carcinoid tumors  . Sudden death Sister     46 week old, congenital deformity  . Vascular Disease Paternal Grandmother     thoracic aneurysm  . Diabetes Neg Hx    Social History  Substance Use Topics  . Smoking status: Former Smoker    Types: Cigarettes    Quit date: 03/28/1999  . Smokeless tobacco: Never Used     Comment: smoked 1967-2000, up to 3/4 ppd  . Alcohol Use: 4.2 oz/week    7 Standard drinks or equivalent per week     Comment:  7/ week   Current Outpatient Prescriptions  Medication Sig Dispense Refill  . aspirin 81 MG tablet Take 81 mg by mouth as directed.     . bimatoprost (LUMIGAN) 0.01 % SOLN Place 1 drop into both eyes at bedtime.    . Ergocalciferol (VITAMIN D2) 2000 UNITS TABS Take  1 tablet by mouth daily.    . ranitidine (ZANTAC) 150 MG tablet Take 1 tablet (150 mg total) by mouth 2 (two) times daily. 60 tablet 0  . rosuvastatin (CRESTOR) 40 MG tablet Take 1 tablet (40 mg total) by mouth daily. 90 tablet 3  . Vaginal Lubricant (REPLENS VA) Place vaginally.    . [DISCONTINUED] metoprolol tartrate (LOPRESSOR) 25 MG tablet Take 1 tablet (25 mg total) by mouth as directed. 1 pill every 8 hours as needed for tachycardia or palpitations. 30 tablet 2   No current facility-administered medications for this visit.   Allergies  Allergen Reactions  . Pravastatin Sodium     Breasts swollen & tender  . Procaine Hcl Palpitations    Tachycardia from Procaine with Epinephrine  . Septra Ds [Sulfamethoxazole-Trimethoprim] Hives  . Alphagan P [Brimonidine Tartrate]     Nervousness, feeling of ants crawling on her skin  . Acetaminophen     Nausea & vomiting    . Fish Oil     gas  . Prednisone     Fear of potential adverse effects     Review of Systems: All systems reviewed and negative except where noted in HPI.   Lab Results  Component Value Date   WBC 6.0 12/25/2014   HGB 13.0 12/25/2014   HCT 40.3 12/25/2014   MCV 89.4 12/25/2014   PLT 196 12/25/2014   Lab Results  Component Value Date   ALT 24 06/18/2014   AST 24 06/18/2014   ALKPHOS 60 06/18/2014   BILITOT 1.1 06/18/2014    Lab Results  Component Value Date   CREATININE 0.57 12/25/2014   BUN 16 12/25/2014   NA 142 12/25/2014   K 3.8 12/25/2014   CL 106 12/25/2014   CO2 27 12/25/2014   Abdominal US - 2012 - normal  Physical Exam: BP 112/60 mmHg  Pulse 77  Ht 5\' 4"  (1.626 m)  Wt 170 lb (77.111 kg)  BMI 29.17 kg/m2  SpO2 98% Constitutional: Pleasant,well-developed, female in no acute distress. HEENT: Normocephalic and atraumatic. Conjunctivae are normal. No scleral icterus. Neck supple.  Cardiovascular: Normal rate, regular rhythm.  Pulmonary/chest: Effort normal and breath sounds normal. No wheezing, rales or rhonchi. Abdominal: Soft, nondistended, nontender. Bowel sounds active throughout. There are no masses palpable. No hepatomegaly. Extremities: no edema Lymphadenopathy: No cervical adenopathy noted. Neurological: Alert and oriented to person place and time. Skin: Skin is warm and dry. No rashes noted. Psychiatric: Normal mood and affect. Behavior is normal.   ASSESSMENT AND PLAN: 69 y/o female with longstanding GERD, controlled well on zantac. She has had trials of PPI in the past but had some sort of intolerance which she cannot remember. She has had a prior EGD in 2012 without evidence of BE. At this time would recommend she continue zantac PRN for her symptoms, as it appears to be working well. In regards to her postprandial bloating, which occurs sporadically, it appears she has some specific food triggers. Recommend trial of a low FODMOP diet and  avoidance of triggers to see if this helps minimize her symptoms. Otherwise no anemia, no alarm symptoms. If the bloating becomes more frequent or worsens I asked her to call me for reassessment but reassured her of prior normal EGD findings. She otherwise is due for a recall colonoscopy in Feb 2017 for screening. She has no bowel symptoms otherwise that warrant an exam sooner. She agreed with the plan, all questions answered.   Mobridge Cellar, MD [facility] Pager  [phone]  

## 2015-02-14 NOTE — Telephone Encounter (Signed)
Crestor now is available as a generic rosuvastatin. This is the same active ingredient as Crestor; it simply has a different coating. To get the brand; you would have to pay more. [facility] fills all my medications with generics also.

## 2015-04-26 DIAGNOSIS — H401132 Primary open-angle glaucoma, bilateral, moderate stage: Secondary | ICD-10-CM | POA: Diagnosis not present

## 2015-05-03 DIAGNOSIS — Z1231 Encounter for screening mammogram for malignant neoplasm of breast: Secondary | ICD-10-CM | POA: Diagnosis not present

## 2015-05-03 LAB — HM MAMMOGRAPHY

## 2015-05-05 ENCOUNTER — Encounter: Payer: Self-pay | Admitting: Internal Medicine

## 2015-05-06 ENCOUNTER — Encounter: Payer: Self-pay | Admitting: Gynecology

## 2015-05-13 DIAGNOSIS — R928 Other abnormal and inconclusive findings on diagnostic imaging of breast: Secondary | ICD-10-CM | POA: Diagnosis not present

## 2015-05-13 DIAGNOSIS — N63 Unspecified lump in breast: Secondary | ICD-10-CM | POA: Diagnosis not present

## 2015-05-13 DIAGNOSIS — Z1231 Encounter for screening mammogram for malignant neoplasm of breast: Secondary | ICD-10-CM | POA: Diagnosis not present

## 2015-05-13 DIAGNOSIS — R922 Inconclusive mammogram: Secondary | ICD-10-CM | POA: Diagnosis not present

## 2015-05-13 LAB — HM MAMMOGRAPHY

## 2015-05-17 ENCOUNTER — Encounter: Payer: Self-pay | Admitting: Gynecology

## 2015-05-18 ENCOUNTER — Other Ambulatory Visit: Payer: Self-pay | Admitting: Radiology

## 2015-05-18 DIAGNOSIS — Z0000 Encounter for general adult medical examination without abnormal findings: Secondary | ICD-10-CM | POA: Diagnosis not present

## 2015-05-18 DIAGNOSIS — N63 Unspecified lump in breast: Secondary | ICD-10-CM | POA: Diagnosis not present

## 2015-05-18 DIAGNOSIS — Z1231 Encounter for screening mammogram for malignant neoplasm of breast: Secondary | ICD-10-CM | POA: Diagnosis not present

## 2015-05-18 DIAGNOSIS — C50912 Malignant neoplasm of unspecified site of left female breast: Secondary | ICD-10-CM | POA: Diagnosis not present

## 2015-05-19 ENCOUNTER — Encounter: Payer: Self-pay | Admitting: Internal Medicine

## 2015-05-20 ENCOUNTER — Telehealth: Payer: Self-pay | Admitting: *Deleted

## 2015-05-20 ENCOUNTER — Encounter: Payer: Self-pay | Admitting: *Deleted

## 2015-05-20 ENCOUNTER — Encounter: Payer: Self-pay | Admitting: Gynecology

## 2015-05-20 DIAGNOSIS — C50212 Malignant neoplasm of upper-inner quadrant of left female breast: Secondary | ICD-10-CM

## 2015-05-20 HISTORY — DX: Malignant neoplasm of upper-inner quadrant of left female breast: C50.212

## 2015-05-20 NOTE — Telephone Encounter (Signed)
Confirmed [facility] for 05/25/15 at 1230 .  Instructions and contact information given. 

## 2015-05-23 ENCOUNTER — Telehealth: Payer: Self-pay

## 2015-05-23 NOTE — Telephone Encounter (Signed)
Patient called to find out date of last pap and result. I called her back and per DPR access note I left a message on voice mail that last pap 10/14/2014 and was normal.

## 2015-05-25 ENCOUNTER — Ambulatory Visit: Payer: Medicare Other | Attending: General Surgery | Admitting: Physical Therapy

## 2015-05-25 ENCOUNTER — Encounter: Payer: Self-pay | Admitting: Nurse Practitioner

## 2015-05-25 ENCOUNTER — Encounter: Payer: Self-pay | Admitting: Physical Therapy

## 2015-05-25 ENCOUNTER — Encounter: Payer: Self-pay | Admitting: Hematology and Oncology

## 2015-05-25 ENCOUNTER — Ambulatory Visit
Admission: RE | Admit: 2015-05-25 | Discharge: 2015-05-25 | Disposition: A | Discharge status: Home / Self Care | Payer: Medicare Other | Source: Ambulatory Visit | Attending: Radiation Oncology | Admitting: Radiation Oncology

## 2015-05-25 ENCOUNTER — Other Ambulatory Visit (HOSPITAL_BASED_OUTPATIENT_CLINIC_OR_DEPARTMENT_OTHER): Payer: Medicare Other

## 2015-05-25 ENCOUNTER — Other Ambulatory Visit: Payer: Self-pay | Admitting: General Surgery

## 2015-05-25 ENCOUNTER — Ambulatory Visit (HOSPITAL_BASED_OUTPATIENT_CLINIC_OR_DEPARTMENT_OTHER): Payer: Medicare Other | Admitting: Hematology and Oncology

## 2015-05-25 VITALS — BP 130/52 | HR 72 | Temp 98.7°F | Resp 18 | Ht 64.0 in | Wt 171.6 lb

## 2015-05-25 DIAGNOSIS — C50212 Malignant neoplasm of upper-inner quadrant of left female breast: Secondary | ICD-10-CM

## 2015-05-25 DIAGNOSIS — Z170 Estrogen receptor positive status [ER+]: Secondary | ICD-10-CM

## 2015-05-25 DIAGNOSIS — C50412 Malignant neoplasm of upper-outer quadrant of left female breast: Secondary | ICD-10-CM | POA: Insufficient documentation

## 2015-05-25 LAB — CBC WITH DIFFERENTIAL/PLATELET
BASO%: 0.3 % (ref 0.0–2.0)
Basophils Absolute: 0 10*3/uL (ref 0.0–0.1)
EOS ABS: 0.1 10*3/uL (ref 0.0–0.5)
EOS%: 1.8 % (ref 0.0–7.0)
HEMATOCRIT: 40.8 % (ref 34.8–46.6)
HGB: 13.4 g/dL (ref 11.6–15.9)
LYMPH#: 1.4 10*3/uL (ref 0.9–3.3)
LYMPH%: 20.4 % (ref 14.0–49.7)
MCH: 28.6 pg (ref 25.1–34.0)
MCHC: 32.9 g/dL (ref 31.5–36.0)
MCV: 87 fL (ref 79.5–101.0)
MONO#: 0.6 10*3/uL (ref 0.1–0.9)
MONO%: 9 % (ref 0.0–14.0)
NEUT%: 68.5 % (ref 38.4–76.8)
NEUTROS ABS: 4.7 10*3/uL (ref 1.5–6.5)
PLATELETS: 219 10*3/uL (ref 145–400)
RBC: 4.69 10*6/uL (ref 3.70–5.45)
RDW: 13.7 % (ref 11.2–14.5)
WBC: 6.9 10*3/uL (ref 3.9–10.3)

## 2015-05-25 LAB — COMPREHENSIVE METABOLIC PANEL
ALT: 20 U/L (ref 0–55)
ANION GAP: 6 meq/L (ref 3–11)
AST: 22 U/L (ref 5–34)
Albumin: 4 g/dL (ref 3.5–5.0)
Alkaline Phosphatase: 59 U/L (ref 40–150)
BUN: 12.9 mg/dL (ref 7.0–26.0)
CHLORIDE: 106 meq/L (ref 98–109)
CO2: 30 meq/L — AB (ref 22–29)
CREATININE: 0.8 mg/dL (ref 0.6–1.1)
Calcium: 9.3 mg/dL (ref 8.4–10.4)
EGFR: 82 mL/min/{1.73_m2} — AB (ref >90)
GLUCOSE: 113 mg/dL (ref 70–140)
Potassium: 4.4 mEq/L (ref 3.5–5.1)
SODIUM: 142 meq/L (ref 136–145)
Total Bilirubin: 1.63 mg/dL — ABNORMAL HIGH (ref 0.20–1.20)
Total Protein: 7.2 g/dL (ref 6.4–8.3)

## 2015-05-25 NOTE — Patient Instructions (Signed)

## 2015-05-25 NOTE — Assessment & Plan Note (Signed)
Right breast needle biopsy 10:00  05/18/2015: Invasive ductal carcinoma grade 1, ER 90%, PR 100%, HER-2 negative ratio 1.3, Ki-67 5%, 0.7 cm right breast distortion , T1 BN 0 stage I a clinical stage  Pathology and radiology counseling:Discussed with the patient, the details of pathology including the type of breast cancer,the clinical staging, the significance of ER, PR and HER-2/neu receptors and the implications for treatment. After reviewing the pathology in detail, we proceeded to discuss the different treatment options between surgery, radiation, chemotherapy, antiestrogen therapies.  Recommendations: 1. Breast conserving surgery followed by 2. Oncotype DX testing to determine if chemotherapy would be of any benefit followed by 3. Adjuvant radiation therapy followed by 4. Adjuvant antiestrogen therapy  Oncotype counseling: I discussed Oncotype DX test. I explained to the patient that this is a 21 gene panel to evaluate patient tumors DNA to calculate recurrence score. This would help determine whether patient has high risk or intermediate risk or low risk breast cancer. She understands that if her tumor was found to be high risk, she would benefit from systemic chemotherapy. If low risk, no need of chemotherapy. If she was found to be intermediate risk, we would need to evaluate the score as well as other risk factors and determine if an abbreviated chemotherapy may be of benefit.  Return to clinic after surgery to discuss final pathology report and then determine if Oncotype DX testing will need to be sent.   

## 2015-05-25 NOTE — Progress Notes (Signed)
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 female who presents to the clinic with an abnormal mammogram. She had a biopsy on 05/18/2015 revealing a 1.3 cm mass showing invasive mammary carcinoma of the left breast, ER 100%, PR 95%, Her2-neu negative with a ratio of 1.02, and KI67 of 50%. No reports of breast pain nipple discharge or bleeding prior to diagnosis.  PREVIOUS RADIATION THERAPY: No  PAST MEDICAL HISTORY:  has a past medical history of Fasting hyperglycemia; DDD (degenerative disc disease); Gilbert syndrome; Breast cyst; Hyperlipidemia; Anxiety; GERD (gastroesophageal reflux disease); Diverticulosis; HSV infection; Atrophic vaginitis; STD (sexually transmitted disease); Glaucoma; and 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[place]) (05/20/2015).    PAST SURGICAL HISTORY: Past Surgical History  Procedure Laterality Date  . Appendectomy    . Breast cyst aspiration      x2 right ,x1 left  . Wisdom tooth extraction    . Dilation and curettage of uterus    . Upper gi endoscopy  10/2010    Dr [name]  . Pelvic laparoscopy    . Colonoscopy  2007    neg    FAMILY HISTORY: family history includes Breast cancer in her paternal aunt; Cancer in her father and mother; Colon cancer in her paternal aunt; Coronary artery disease in her father; Endometriosis in her mother; Leukemia in her maternal uncle; Liver cancer in her maternal uncle; Lung cancer in her mother; Stomach cancer in her maternal  grandmother; Stroke (age of onset: 32) in her paternal grandfather; Sudden death in her sister; Vascular Disease in her paternal grandmother. There is no history of Diabetes.  SOCIAL HISTORY:  reports that she quit smoking about 16 years ago. Her smoking use included Cigarettes. She has never used smokeless tobacco. She reports that she drinks about 4.2 oz of alcohol per week. She reports that she does not use illicit drugs.  ALLERGIES: Pravastatin sodium; Procaine hcl; Septra ds; Alphagan p; Acetaminophen; Fish oil; and Prednisone  MEDICATIONS:  Current Outpatient Prescriptions  Medication Sig Dispense Refill  . aspirin 81 MG tablet Take 81 mg by mouth as directed.     . bimatoprost (LUMIGAN) 0.01 % SOLN Place 1 drop into both eyes at bedtime.    . brinzolamide (AZOPT) 1 % ophthalmic suspension 1 drop 2 (two) times daily.    . Ergocalciferol (VITAMIN D2) 2000 UNITS TABS Take 1 tablet by mouth daily.    . ranitidine (ZANTAC) 150 MG tablet Take 1 tablet (150 mg total) by mouth 2 (two) times daily. 60 tablet 0  . rosuvastatin (CRESTOR) 40 MG tablet Take 1 tablet (40 mg total) by mouth daily. 90 tablet 3  . Vaginal Lubricant (REPLENS VA) Place vaginally.    . [DISCONTINUED] metoprolol tartrate (LOPRESSOR) 25 MG tablet Take 1 tablet (25 mg total) by mouth as directed. 1 pill every 8 hours as needed for tachycardia or palpitations. 30 tablet 2   No current facility-administered medications for this encounter.    REVIEW OF SYSTEMS:  A  15 point review of systems is documented in the electronic medical record. This was obtained by the nursing staff. However, I reviewed this with the patient to discuss relevant findings and make appropriate changes.  Pertinent items are noted in HPI.    In terms of breast cancer risk profile:  She menarched at early age of 6 and half and went to menopause at age 73  She had no pregnancies  She has not received birth control pills.  She was exposed to fertility  medications or hormone replacement therapy for 5 years.  She has no family history of Breast/GYN/GI cancer    PHYSICAL EXAM: Vitals - 1 value per visit 01/26/1938  SYSTOLIC 740  DIASTOLIC 52  Pulse 72  Temperature 98.7  Respirations 18  Weight (lb) 171.6  Height _0   BMI 29.44  VISIT REPORT    General: Alert and oriented, in no acute distress HEENT: Head is normocephalic.  Neck: Neck is supple, no palpable cervical or supraclavicular lymphadenopathy. Heart: Regular in rate and rhythm with no murmurs, rubs, or gallops. Chest: Clear to auscultation bilaterally, with no rhonchi, wheezes, or rales. Extremities: No cyanosis or edema. Lymphatics: see Neck Exam Skin: No concerning lesions. Neurologic: . No obvious focalities. Speech is fluent. Coordination is intact. Psychiatric: Judgment and insight are intact. Affect is appropriate. Steristrip in upper outer quadrant of left breast. There is also some bruising in this same area. No palpable mass or nipple discharge in the left or right breast.  ECOG = 0  LABORATORY DATA:  Lab Results  Component Value Date   WBC 6.0 12/25/2014   HGB 13.0 12/25/2014   HCT 40.3 12/25/2014   MCV 89.4 12/25/2014   PLT 196 12/25/2014   NEUTROABS 3.7 12/25/2014   Lab Results  Component Value Date   NA 142 12/25/2014   K 3.8 12/25/2014   CL 106 12/25/2014   CO2 27 12/25/2014   GLUCOSE 119* 12/25/2014   CREATININE 0.57 12/25/2014   CALCIUM 8.7* 12/25/2014      RADIOGRAPHY: [facility] images reviewed in the multidisciplinary conference  IMPRESSION: Lindsey Barrett is a 70 yo female with invasive mammary carcinoma of the left breast. She is a good candidate for a lumpectomy and sentinel node biopsy, oncotype, external radiation, and hormone therapy.  PLAN: She will be scheduled for a lumpectomy and sentinel node biopsy. Following the surgery, she will receive external radiation and hormone  therapy.  ------------------------------------------------  Blair Promise, PhD, MD    This document serves as a record of services personally performed by Gery Pray, MD. It was created on his behalf by Lendon Collar, a trained medical scribe. The creation of this record is based on the scribe's personal observations and the provider's statements to them. This document has been checked and approved by the attending provider.

## 2015-05-25 NOTE — Progress Notes (Signed)
Ms. Cedeno is a very pleasant 70 y.o. female from [place], [place] with newly diagnosed invasive mammary carcinoma of the left breast.  Biopsy results revealed the tumor's prognostic profile is ER positive, PR positive, and HER2/neu negative.   She presents today with her friend to the [facility]) for treatment consideration and recommendations from the breast surgeon, radiation oncologist, and medical oncologist.     I briefly met with Ms. Connaughton and her friend during her [facility] visit today. We discussed the purpose of the Survivorship Clinic, which will include monitoring for recurrence, coordinating completion of age and gender-appropriate cancer screenings, promotion of overall wellness, as well as managing potential late/long-term side effects of anti-cancer treatments.    The treatment plan for Ms. Canova will likely include surgery, radiation therapy, and anti-estrogen therapy. As of today, the intent of treatment for Ms. Krogstad is cure, therefore she will be eligible for the Survivorship Clinic upon her completion of treatment.  Her survivorship care plan (SCP) document will be drafted and updated throughout the course of her treatment trajectory. She will receive the SCP in an office visit with myself in the Survivorship Clinic once she has completed treatment.   Ms. Beth was encouraged to ask questions and all questions were answered to her satisfaction.  She was given my business card and encouraged to contact me with any concerns regarding survivorship.  I look forward to participating in her care.   Kenn File, [place] [phone]

## 2015-05-25 NOTE — Progress Notes (Signed)
[facility] CONSULT NOTE  Patient Care Team: Hendricks Limes, MD as PCP - General (Internal Medicine) [name], MD as Consulting Physician (Gynecology)  CHIEF COMPLAINTS/PURPOSE OF CONSULTATION:  Newly diagnosed breast cancer  HISTORY OF PRESENTING ILLNESS:  Lindsey Barrett 70 y.o. female is here because of recent diagnosis of left breast cancer. Patient had a routine screening mammogram which she does every year which revealed asymmetry in the left breast. This was further evaluated was found to have a 1.3 cm abnormality at 1:00 position of the left breast axilla was negative. 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 She was presented this morning at the multidisciplinary tumor board and she is here today to discuss a treatment plan. She is accompanied by her friend who is a breast cancer survivor.  I reviewed her records extensively and collaborated the history with the patient.  SUMMARY OF ONCOLOGIC HISTORY:   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[place])   05/18/2015 Initial Diagnosis Left breast biopsy: Invasive ductal carcinoma low to intermediate grade, ER 100 pleasant, P a 95%, HER-2 negative ratio 1.02, Ki-67 5%, left breast asymmetry 1.3 cm, T1c N0 stage IA    In terms of breast cancer risk profile:  She menarched at early age of 25 and half and went to menopause at age 72  She had no pregnancies  She has not received birth control pills.  She was exposed to fertility medications or hormone replacement therapy for 5 years.  She has no family history of Breast/GYN/GI cancer  MEDICAL HISTORY:  Past Medical History  Diagnosis Date  . Fasting hyperglycemia   . DDD (degenerative disc disease)   . Gilbert syndrome   . Breast cyst     x3  . Hyperlipidemia   . Anxiety   . GERD (gastroesophageal reflux disease)   . Diverticulosis    . HSV infection   . Atrophic vaginitis   . STD (sexually transmitted disease)     HSV  . Glaucoma   . 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[place]) 05/20/2015  . Breast cancer [facility])     SURGICAL HISTORY: Past Surgical History  Procedure Laterality Date  . Appendectomy    . Breast cyst aspiration      x2 right ,x1 left  . Wisdom tooth extraction    . Dilation and curettage of uterus    . Upper gi endoscopy  10/2010    Dr [name]  . Pelvic laparoscopy    . Colonoscopy  2007    neg    SOCIAL HISTORY: Social History   Social History  . Marital Status: Divorced    Spouse Name: N/A  . Number of Children: N/A  . Years of Education: N/A   Occupational History  . Environmental health practitioner for [employer] History Main Topics  . Smoking status: Former Smoker    Types: Cigarettes    Quit date: 03/28/1999  . Smokeless tobacco: Never Used     Comment: smoked 1967-2000, up to 3/4 ppd  . Alcohol Use: 4.2 oz/week    7 Standard drinks or equivalent per week     Comment:  7/ week  . Drug Use: No  . Sexual Activity: No     Comment: 1st intercourse 70 yo-More than 5 partners   Other Topics Concern  . 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      Regular exercise-yes: gym once per week    FAMILY HISTORY: Family History  Problem Relation Age of Onset  . Lung cancer Mother     smoker  . Endometriosis Mother   . Cancer Mother     Bladder cancer  . Liver cancer Maternal Uncle   . Stomach cancer Maternal Grandmother   . Stroke Paternal Grandfather 44  . Leukemia Maternal Uncle   . Breast cancer Paternal Aunt     great paternal aunt-Age 42's  . Colon cancer Paternal Aunt     great paternal aunt  . Coronary artery disease Father   . Cancer Father     intestinal carcinoid tumors  . Sudden death Sister     79 week old, congenital deformity  . Vascular Disease Paternal Grandmother     thoracic  aneurysm  . Diabetes Neg Hx     ALLERGIES:  is allergic to septra ds.  MEDICATIONS:  Current Outpatient Prescriptions  Medication Sig Dispense Refill  . aspirin 81 MG tablet Take 81 mg by mouth as directed.     . bimatoprost (LUMIGAN) 0.01 % SOLN Place 1 drop into both eyes at bedtime.    . brinzolamide (AZOPT) 1 % ophthalmic suspension 1 drop 2 (two) times daily.    . calcium carbonate (TUMS - DOSED IN MG ELEMENTAL CALCIUM) 500 MG chewable tablet Chew 1 tablet by mouth as needed for indigestion or heartburn.    . Ergocalciferol (VITAMIN D2) 2000 UNITS TABS Take 1 tablet by mouth daily.    [name] ibuprofen (ADVIL,MOTRIN) 200 MG tablet Take 200 mg by mouth every 6 (six) hours as needed.    . ranitidine (ZANTAC) 150 MG tablet Take 1 tablet (150 mg total) by mouth 2 (two) times daily. 60 tablet 0  . rosuvastatin (CRESTOR) 40 MG tablet Take 1 tablet (40 mg total) by mouth daily. 90 tablet 3  . [DISCONTINUED] metoprolol tartrate (LOPRESSOR) 25 MG tablet Take 1 tablet (25 mg total) by mouth as directed. 1 pill every 8 hours as needed for tachycardia or palpitations. 30 tablet 2   No current facility-administered medications for this visit.    REVIEW OF SYSTEMS:   Constitutional: Denies fevers, chills or abnormal night sweats Eyes: Denies blurriness of vision, double vision or watery eyes Ears, nose, mouth, throat, and face: Denies mucositis or sore throat Respiratory: Denies cough, dyspnea or wheezes Cardiovascular: Denies palpitation, chest discomfort or lower extremity swelling Gastrointestinal:  Denies nausea, heartburn or change in bowel habits Skin: Denies abnormal skin rashes Lymphatics: Denies new lymphadenopathy or easy bruising Neurological:Denies numbness, tingling or new weaknesses Behavioral/Psych: Mood is stable, no new changes  Breast:  Denies any palpable lumps or discharge All other systems were reviewed with the patient and are negative.  PHYSICAL EXAMINATION: ECOG  PERFORMANCE STATUS: 1 - Symptomatic but completely ambulatory  Filed Vitals:   05/25/15 1257  BP: 130/52  Pulse: 72  Temp: 98.7 F (37.1 C)  Resp: 18   Filed Weights   05/25/15 1257  Weight: 171 lb 9.6 oz (77.837 kg)    GENERAL:alert, no distress and comfortable SKIN: skin color, texture, turgor are normal, no rashes or significant lesions EYES: normal, conjunctiva are pink and non-injected, sclera clear OROPHARYNX:no exudate, no erythema and lips, buccal mucosa, and tongue normal  NECK: supple, thyroid normal size, non-tender, without nodularity LYMPH:  no palpable lymphadenopathy  in the cervical, axillary or inguinal LUNGS: clear to auscultation and percussion with normal breathing effort HEART: regular rate & rhythm and no murmurs and no lower extremity edema ABDOMEN:abdomen soft, non-tender and normal bowel sounds Musculoskeletal:no cyanosis of digits and no clubbing  PSYCH: alert & oriented x 3 with fluent speech NEURO: no focal motor/sensory deficits BREAST:hematoma where she had the biopsy previously. No palpable axillary or supraclavicular lymphadenopathy (exam performed in the presence of a chaperone)   LABORATORY DATA:  I have reviewed the data as listed Lab Results  Component Value Date   WBC 6.9 05/25/2015   HGB 13.4 05/25/2015   HCT 40.8 05/25/2015   MCV 87.0 05/25/2015   PLT 219 05/25/2015   Lab Results  Component Value Date   NA 142 05/25/2015   K 4.4 05/25/2015   CL 106 12/25/2014   CO2 30* 05/25/2015   ASSESSMENT AND PLAN:  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[place]) Right breast needle biopsy 10:00 05/18/2015: Invasive ductal carcinoma grade 1, ER 90%, PR 100%, HER-2 negative ratio 1.3, Ki-67 5%, 0.7 cm right breast distortion , T1 BN 0 stage I a clinical stage  Pathology and radiology counseling:Discussed with the patient, the details of pathology including the type of breast cancer,the clinical staging, the significance of  ER, PR and HER-2/neu receptors and the implications for treatment. After reviewing the pathology in detail, we proceeded to discuss the different treatment options between surgery, radiation, chemotherapy, antiestrogen therapies.  Recommendations: 1. Breast conserving surgery followed by 2. Oncotype DX testing to determine if chemotherapy would be of any benefit followed by 3. Adjuvant radiation therapy followed by 4. Adjuvant antiestrogen therapy  Oncotype counseling: I discussed Oncotype DX test. I explained to the patient that this is a 21 gene panel to evaluate patient tumors DNA to calculate recurrence score. This would help determine whether patient has high risk or intermediate risk or low risk breast cancer. She understands that if her tumor was found to be high risk, she would benefit from systemic chemotherapy. If low risk, no need of chemotherapy. If she was found to be intermediate risk, we would need to evaluate the score as well as other risk factors and determine if an abbreviated chemotherapy may be of benefit.  Return to clinic after surgery to discuss final pathology report and then determine if Oncotype DX testing will need to be sent.   All questions were answered. The patient knows to call the clinic with any problems, questions or concerns.    [name], MD 05/25/2015

## 2015-05-25 NOTE — Therapy (Signed)
[place], [place], [address] Phone: [phone]   Fax:  [phone]  Physical Therapy Evaluation  Patient Details  Name: Lindsey Barrett MRN: [id number] Date of Birth: Jun 09, 1945 Referring Provider: Dr. [name]  Encounter Date: 05/25/2015      PT End of Session - 05/25/15 1711    Visit Number 1   Number of Visits 1   PT Start Time 1400   PT Stop Time 1412  Also saw pt from [phone] for a total of 23 minutes   PT Time Calculation (min) 12 min   Activity Tolerance Patient tolerated treatment well   Behavior During Therapy [facility] for tasks assessed/performed      Past Medical History  Diagnosis Date  . Fasting hyperglycemia   . DDD (degenerative disc disease)   . Gilbert syndrome   . Breast cyst     x3  . Hyperlipidemia   . Anxiety   . GERD (gastroesophageal reflux disease)   . Diverticulosis   . HSV infection   . Atrophic vaginitis   . STD (sexually transmitted disease)     HSV  . Glaucoma   . 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[place]) 05/20/2015  . Breast cancer [facility])     Past Surgical History  Procedure Laterality Date  . Appendectomy    . Breast cyst aspiration      x2 right ,x1 left  . Wisdom tooth extraction    . Dilation and curettage of uterus    . Upper gi endoscopy  10/2010    Dr [name]  . Pelvic laparoscopy    . Colonoscopy  2007    neg    There were no vitals filed for this visit.  Visit Diagnosis:  Carcinoma of upper-outer quadrant of left female breast [facility]) - Plan: PT plan of care cert/re-cert      Subjective Assessment - 05/25/15 1702    Subjective Patient was seen today for a baseline assessment of her newly diagnosed left breast cancer.   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 It is located in the upper outer quadrant and measures 1.3 cm.  It is ER/PR  positive, HER2 negative with a Ki67 of 5%.  Her case was discussed among her physician team in a multidisciplinary clinic with PT present to deermine best plan for treatment prior to seeing her.   Patient Stated Goals Reduce lymphedema risk and learn post op shoulder ROM HEP   Currently in Pain? No/denies            [facility] PT Assessment - 05/25/15 0001    Assessment   Medical Diagnosis Left breast cancer   Referring Provider Dr. [name]   Onset Date/Surgical Date 05/03/15   Hand Dominance Right   Prior Therapy none   Precautions   Precautions Other (comment)   Precaution Comments active breast cancer   Restrictions   Weight Bearing Restrictions No   Balance Screen   Has the patient fallen in the past 6 months No   Has the patient had a decrease in activity level because of a fear of falling?  No   Is the patient reluctant to leave their home because of a fear of falling?  No   Home Environment   Living Environment Private residence   Living Arrangements Alone   Available Help at Discharge Friend(s)   Prior Function   Level of  Independence Independent   Vocation Full time employment   Furniture conservator/restorer at [employer] She does not exercise   Cognition   Overall Cognitive Status Within Functional Limits for tasks assessed   Posture/Postural Control   Posture/Postural Control No significant limitations   ROM / Strength   AROM / PROM / Strength AROM;Strength   AROM   AROM Assessment Site Shoulder   Right/Left Shoulder Right;Left   Right Shoulder Extension 60 Degrees   Right Shoulder Flexion 152 Degrees   Right Shoulder ABduction 162 Degrees   Right Shoulder Internal Rotation 64 Degrees   Right Shoulder External Rotation 80 Degrees   Left Shoulder Extension 49 Degrees   Left Shoulder Flexion 157 Degrees   Left Shoulder ABduction 152 Degrees   Left Shoulder Internal Rotation 58 Degrees   Left Shoulder External Rotation 83 Degrees    Strength   Overall Strength Within functional limits for tasks performed           LYMPHEDEMA/ONCOLOGY QUESTIONNAIRE - 05/25/15 1709    Type   Cancer Type Left breast cancer   Lymphedema Assessments   Lymphedema Assessments Upper extremities   Right Upper Extremity Lymphedema   10 cm Proximal to Olecranon Process 27.6 cm   Olecranon Process 25.3 cm   10 cm Proximal to Ulnar Styloid Process 22 cm   Just Proximal to Ulnar Styloid Process 15.2 cm   Across Hand at [employer] 18.7 cm   At [place] of 2nd Digit 6.4 cm   Left Upper Extremity Lymphedema   10 cm Proximal to Olecranon Process 26.4 cm   Olecranon Process 24.6 cm   10 cm Proximal to Ulnar Styloid Process 21.1 cm   Just Proximal to Ulnar Styloid Process 15.3 cm   Across Hand at [employer] 18.6 cm   At [place] of 2nd Digit 6.2 cm      Patient was instructed today in a home exercise program today for post op shoulder range of motion. These included active assist shoulder flexion in sitting, scapular retraction, wall walking with shoulder abduction, and hands behind head external rotation.  She was encouraged to do these twice a day, holding 3 seconds and repeating 5 times when permitted by her physician.         PT Education - 05/25/15 1710    Education provided Yes   Education Details Post op shoudler ROM HEP and lymphedema risk reduction   Person(s) Educated Patient   Methods Explanation;Demonstration;Handout   Comprehension Returned demonstration;Verbalized understanding              Breast Clinic Goals - 05/25/15 1713    Patient will be able to verbalize understanding of pertinent lymphedema risk reduction practices relevant to her diagnosis specifically related to skin care.   Time 1   Period Days   Status Achieved   Patient will be able to return demonstrate and/or verbalize understanding of the post-op home exercise program related to regaining shoulder range of motion.   Time 1   Period Days    Status Achieved   Patient will be able to verbalize understanding of the importance of attending the postoperative After Breast Cancer Class for further lymphedema risk reduction education and therapeutic exercise.   Time 1   Period Days   Status Achieved              Plan - 05/25/15 1711    Clinical Impression Statement Patient was diagnosed on 05/03/15 with  left grade 1-2 invasive ductal carcinoma breast cancer. It is located in the upper outer quadrant and measures 1.3 cm.  It is ER/PR positive, HER2 negative with a Ki67 of 5%.  Her case was discussed among her physician team in a multidisciplinary clinic with PT present to determine best plan for treatment prior to seeing her.  She is planning to have a left lumpectomy and sentinel node biopsy followed by Oncotype testing, radiation and anti-estrogen therapy.  She may benefit from post op PT to regain shoulder ROM and strength and reduce lymphedema risk.   Pt will benefit from skilled therapeutic intervention in order to improve on the following deficits Decreased strength;Pain;Decreased knowledge of precautions;Impaired UE functional use;Decreased range of motion   Rehab Potential Excellent   Clinical Impairments Affecting Rehab Potential none   PT Frequency One time visit   PT Treatment/Interventions Therapeutic exercise;Patient/family education   PT Next Visit Plan F/u post op to determine PT needs   PT Home Exercise Plan shoulder ROM   Consulted and Agree with Plan of Care Patient     Patient will follow up at outpatient cancer rehab if needed following surgery.  If the patient requires physical therapy at that time, a specific plan will be dictated and sent to the referring physician for approval. The patient was educated today on appropriate basic range of motion exercises to begin post operatively and the importance of attending the After Breast Cancer class following surgery.  Patient was educated today on lymphedema risk  reduction practices as it pertains to recommendations that will benefit the patient immediately following surgery.  She verbalized good understanding.  No additional physical therapy is indicated at this time.         G-Codes - June 12, 2015 1714    Functional Assessment Tool Used Clinical Judgement   Functional Limitation Other PT subsequent   Other PT Secondary Current Status (Z0258) At least 1 percent but less than 20 percent impaired, limited or restricted   Other PT Secondary Goal Status (N2778) At least 1 percent but less than 20 percent impaired, limited or restricted   Other PT Secondary Discharge Status (E4235) At least 1 percent but less than 20 percent impaired, limited or restricted       Problem List Patient Active Problem List   Diagnosis Date Noted  . 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[place]) 05/20/2015  . Open-angle glaucoma 06/17/2014  . Unspecified adverse effect of unspecified drug, medicinal and biological substance 10/18/2011  . Vitamin D deficiency 10/18/2011  . STD (sexually transmitted disease)   . HSV infection   . Atrophic vaginitis   . VARICOSE VEINS, LOWER EXTREMITIES 09/28/2009  . HEADACHE 09/28/2009  . GERD 07/14/2009  . NONSPECIFIC ABNORMAL ELECTROCARDIOGRAM 07/14/2009  . Hyperglycemia 01/01/2008  . Hyperlipidemia 12/26/2006  . GILBERT'S SYNDROME 08/09/2006  . DEGENERATIVE DISC DISEASE 08/09/2006    Annia Friendly, PT 06-12-2015 5:15 PM   [place], [place], [address] Phone: [phone]   Fax:  [phone]  Name: Lindsey Barrett MRN: [id number] Date of Birth: 11-29-1945

## 2015-05-25 NOTE — Progress Notes (Signed)
Note created by Dr. Gudena during office visit, copy to patient,original to scan. 

## 2015-05-27 ENCOUNTER — Encounter (HOSPITAL_BASED_OUTPATIENT_CLINIC_OR_DEPARTMENT_OTHER): Payer: Self-pay | Admitting: *Deleted

## 2015-05-27 NOTE — Progress Notes (Signed)
[facility] Psychosocial Distress Screening Counseling Intern   Clinical Social Work was referred by distress screening protocol.  The patient scored a 5 on the Psychosocial Distress Thermometer which indicates Moderate distress. Counseling Intern Vaughan Sine to assess for distress and other psychosocial needs.    [facility] DISTRESS SCREENING 05/25/2015  Screening Type Initial Screening  Distress experienced in past week (1-10) 5  Practical problem type Work/school  Emotional problem type Nervousness/Anxiety  Information Concerns Type Lack of info about diagnosis;Lack of info about treatment  Referral to support programs Yes   Counseling Intern Note: Met with pt and friend at the end of [facility].  Pt reported that her distress had decreased from a 5 to a 2 by the time of our encounter.  Pt stated that her information needs regarding her Dx and Tx had been met during clinic. Regarding work/school concerns, Pt reported a positive and supportive work environment and endorsed normative concerns around disclosing Dx/Tx information in her workplace and delegating work responsibilities during Tx. Pt stated that her nervousness/anxiety is somewhat of an ongoing issue for her but that her Dx has somewhat increased her anxiety.  Pt endorsed having a large support network of friends and family and expressed optimism and hope around her Dx and Tx.  Counseling Intern introduced and provided information about services and resources at the [facility] support center.  Upon exiting clinic, Pt requested a tour of the support center and was accompanied by counseling intern.   No specific support center follow up is indicated at this time.   Vaughan Sine Counseling Intern [phone]

## 2015-05-30 ENCOUNTER — Telehealth: Payer: Self-pay | Admitting: *Deleted

## 2015-05-30 NOTE — Telephone Encounter (Signed)
Spoke to pt concerning [place] from 05/25/15. Denies questions or concerns regarding dx or treatment care plan. Encourage pt to call with need. Received verbal understanding.

## 2015-05-31 ENCOUNTER — Ambulatory Visit (HOSPITAL_BASED_OUTPATIENT_CLINIC_OR_DEPARTMENT_OTHER): Payer: Medicare Other | Admitting: Anesthesiology

## 2015-05-31 ENCOUNTER — Telehealth: Payer: Self-pay | Admitting: Hematology and Oncology

## 2015-05-31 ENCOUNTER — Ambulatory Visit (HOSPITAL_BASED_OUTPATIENT_CLINIC_OR_DEPARTMENT_OTHER)
Admission: RE | Admit: 2015-05-31 | Discharge: 2015-05-31 | Disposition: A | Discharge status: Home / Self Care | Payer: Medicare Other | Source: Ambulatory Visit | Attending: General Surgery | Admitting: General Surgery

## 2015-05-31 ENCOUNTER — Encounter (HOSPITAL_BASED_OUTPATIENT_CLINIC_OR_DEPARTMENT_OTHER)
Admission: RE | Disposition: A | Discharge status: Home / Self Care | Payer: Self-pay | Source: Ambulatory Visit | Attending: General Surgery

## 2015-05-31 ENCOUNTER — Ambulatory Visit (HOSPITAL_COMMUNITY)
Admission: RE | Admit: 2015-05-31 | Discharge: 2015-05-31 | Disposition: A | Discharge status: Home / Self Care | Payer: Medicare Other | Source: Ambulatory Visit | Attending: General Surgery | Admitting: General Surgery

## 2015-05-31 ENCOUNTER — Encounter (HOSPITAL_BASED_OUTPATIENT_CLINIC_OR_DEPARTMENT_OTHER): Payer: Self-pay

## 2015-05-31 DIAGNOSIS — E7800 Pure hypercholesterolemia, unspecified: Secondary | ICD-10-CM | POA: Insufficient documentation

## 2015-05-31 DIAGNOSIS — C50412 Malignant neoplasm of upper-outer quadrant of left female breast: Secondary | ICD-10-CM | POA: Insufficient documentation

## 2015-05-31 DIAGNOSIS — G8918 Other acute postprocedural pain: Secondary | ICD-10-CM | POA: Diagnosis not present

## 2015-05-31 DIAGNOSIS — K219 Gastro-esophageal reflux disease without esophagitis: Secondary | ICD-10-CM | POA: Insufficient documentation

## 2015-05-31 DIAGNOSIS — R079 Chest pain, unspecified: Secondary | ICD-10-CM | POA: Diagnosis not present

## 2015-05-31 DIAGNOSIS — M1990 Unspecified osteoarthritis, unspecified site: Secondary | ICD-10-CM | POA: Diagnosis not present

## 2015-05-31 DIAGNOSIS — Z7982 Long term (current) use of aspirin: Secondary | ICD-10-CM | POA: Insufficient documentation

## 2015-05-31 DIAGNOSIS — E119 Type 2 diabetes mellitus without complications: Secondary | ICD-10-CM | POA: Insufficient documentation

## 2015-05-31 DIAGNOSIS — Z87891 Personal history of nicotine dependence: Secondary | ICD-10-CM | POA: Insufficient documentation

## 2015-05-31 DIAGNOSIS — Z1231 Encounter for screening mammogram for malignant neoplasm of breast: Secondary | ICD-10-CM | POA: Diagnosis not present

## 2015-05-31 DIAGNOSIS — Z170 Estrogen receptor positive status [ER+]: Secondary | ICD-10-CM | POA: Insufficient documentation

## 2015-05-31 DIAGNOSIS — N63 Unspecified lump in breast: Secondary | ICD-10-CM | POA: Diagnosis not present

## 2015-05-31 DIAGNOSIS — C50212 Malignant neoplasm of upper-inner quadrant of left female breast: Secondary | ICD-10-CM

## 2015-05-31 DIAGNOSIS — C50912 Malignant neoplasm of unspecified site of left female breast: Secondary | ICD-10-CM | POA: Diagnosis present

## 2015-05-31 DIAGNOSIS — N6012 Diffuse cystic mastopathy of left breast: Secondary | ICD-10-CM | POA: Diagnosis not present

## 2015-05-31 HISTORY — PX: RADIOACTIVE SEED GUIDED PARTIAL MASTECTOMY WITH AXILLARY SENTINEL LYMPH NODE BIOPSY: SHX6520

## 2015-05-31 HISTORY — DX: Personal history of other diseases of the digestive system: Z87.19

## 2015-05-31 SURGERY — RADIOACTIVE SEED GUIDED PARTIAL MASTECTOMY WITH AXILLARY SENTINEL LYMPH NODE BIOPSY
Anesthesia: Regional | Site: Breast | Laterality: Left

## 2015-05-31 MED ORDER — BUPIVACAINE HCL (PF) 0.25 % IJ SOLN
INTRAMUSCULAR | Status: AC
  Filled 2015-05-31: qty 30

## 2015-05-31 MED ORDER — TECHNETIUM TC 99M SULFUR COLLOID FILTERED
1.0000 | Freq: Once | INTRAVENOUS | Status: AC | PRN
  Administered 2015-05-31: 1 via INTRADERMAL

## 2015-05-31 MED ORDER — FENTANYL CITRATE (PF) 100 MCG/2ML IJ SOLN
INTRAMUSCULAR | Status: AC
  Filled 2015-05-31: qty 2

## 2015-05-31 MED ORDER — GLYCOPYRROLATE 0.2 MG/ML IJ SOLN: 0.2000 mg | Freq: Once | INTRAMUSCULAR | Status: DC | PRN

## 2015-05-31 MED ORDER — FENTANYL CITRATE (PF) 100 MCG/2ML IJ SOLN
50.0000 ug | INTRAMUSCULAR | Status: DC | PRN
  Administered 2015-05-31 (×2): 50 ug via INTRAVENOUS

## 2015-05-31 MED ORDER — MIDAZOLAM HCL 2 MG/2ML IJ SOLN
INTRAMUSCULAR | Status: AC
  Filled 2015-05-31: qty 2

## 2015-05-31 MED ORDER — DEXAMETHASONE SODIUM PHOSPHATE 4 MG/ML IJ SOLN
INTRAMUSCULAR | Status: DC | PRN
  Administered 2015-05-31: 10 mg via INTRAVENOUS

## 2015-05-31 MED ORDER — LACTATED RINGERS IV SOLN
INTRAVENOUS | Status: DC
  Administered 2015-05-31 (×2): via INTRAVENOUS

## 2015-05-31 MED ORDER — PROPOFOL 10 MG/ML IV BOLUS
INTRAVENOUS | Status: DC | PRN
  Administered 2015-05-31: 200 mg via INTRAVENOUS

## 2015-05-31 MED ORDER — BUPIVACAINE HCL (PF) 0.25 % IJ SOLN
INTRAMUSCULAR | Status: DC | PRN
  Administered 2015-05-31: 5 mL

## 2015-05-31 MED ORDER — MIDAZOLAM HCL 2 MG/2ML IJ SOLN
1.0000 mg | INTRAMUSCULAR | Status: DC | PRN
  Administered 2015-05-31: 2 mg via INTRAVENOUS
  Administered 2015-05-31: 1 mg via INTRAVENOUS

## 2015-05-31 MED ORDER — LIDOCAINE HCL (CARDIAC) 20 MG/ML IV SOLN
INTRAVENOUS | Status: AC
  Filled 2015-05-31: qty 5

## 2015-05-31 MED ORDER — PROMETHAZINE HCL 25 MG/ML IJ SOLN: 6.2500 mg | INTRAMUSCULAR | Status: DC | PRN

## 2015-05-31 MED ORDER — ONDANSETRON HCL 4 MG/2ML IJ SOLN
INTRAMUSCULAR | Status: DC | PRN
  Administered 2015-05-31: 4 mg via INTRAVENOUS

## 2015-05-31 MED ORDER — CEFAZOLIN SODIUM-DEXTROSE 2-3 GM-% IV SOLR
INTRAVENOUS | Status: AC
  Filled 2015-05-31: qty 50

## 2015-05-31 MED ORDER — FENTANYL CITRATE (PF) 100 MCG/2ML IJ SOLN
25.0000 ug | INTRAMUSCULAR | Status: DC | PRN
  Administered 2015-05-31 (×2): 25 ug via INTRAVENOUS
  Administered 2015-05-31: 50 ug via INTRAVENOUS

## 2015-05-31 MED ORDER — DEXAMETHASONE SODIUM PHOSPHATE 10 MG/ML IJ SOLN
INTRAMUSCULAR | Status: AC
  Filled 2015-05-31: qty 1

## 2015-05-31 MED ORDER — SCOPOLAMINE 1 MG/3DAYS TD PT72: 1.0000 | MEDICATED_PATCH | Freq: Once | TRANSDERMAL | Status: DC | PRN

## 2015-05-31 MED ORDER — BUPIVACAINE-EPINEPHRINE (PF) 0.5% -1:200000 IJ SOLN
INTRAMUSCULAR | Status: DC | PRN
  Administered 2015-05-31: 30 mL via PERINEURAL

## 2015-05-31 MED ORDER — CEFAZOLIN SODIUM-DEXTROSE 2-3 GM-% IV SOLR
2.0000 g | INTRAVENOUS | Status: AC
  Administered 2015-05-31: 2 g via INTRAVENOUS

## 2015-05-31 MED ORDER — HYDROCODONE-ACETAMINOPHEN 10-325 MG PO TABS: 1.0000 | ORAL_TABLET | Freq: Four times a day (QID) | ORAL | Status: DC | PRN

## 2015-05-31 MED ORDER — LIDOCAINE HCL (CARDIAC) 20 MG/ML IV SOLN
INTRAVENOUS | Status: DC | PRN
  Administered 2015-05-31: 50 mg via INTRAVENOUS

## 2015-05-31 MED ORDER — ONDANSETRON HCL 4 MG/2ML IJ SOLN
INTRAMUSCULAR | Status: AC
  Filled 2015-05-31: qty 2

## 2015-05-31 MED ORDER — SODIUM CHLORIDE 0.9 % IJ SOLN
INTRAMUSCULAR | Status: AC
  Filled 2015-05-31: qty 10

## 2015-05-31 SURGICAL SUPPLY — 59 items
APPLIER CLIP 9.375 MED OPEN (MISCELLANEOUS) ×3
BINDER BREAST LRG (GAUZE/BANDAGES/DRESSINGS) ×3 IMPLANT
BINDER BREAST MEDIUM (GAUZE/BANDAGES/DRESSINGS) IMPLANT
BINDER BREAST XLRG (GAUZE/BANDAGES/DRESSINGS) IMPLANT
BINDER BREAST XXLRG (GAUZE/BANDAGES/DRESSINGS) IMPLANT
BLADE SURG 15 STRL LF DISP TIS (BLADE) ×1 IMPLANT
BLADE SURG 15 STRL SS (BLADE) ×2
CANISTER SUC SOCK COL 7IN (MISCELLANEOUS) IMPLANT
CANISTER SUCT 1200ML W/VALVE (MISCELLANEOUS) IMPLANT
CHLORAPREP W/TINT 26ML (MISCELLANEOUS) ×3 IMPLANT
CLIP APPLIE 9.375 MED OPEN (MISCELLANEOUS) ×1 IMPLANT
CLOSURE STERI-STRIP 1/2X4 (GAUZE/BANDAGES/DRESSINGS) ×2
CLOSURE WOUND 1/2 X4 (GAUZE/BANDAGES/DRESSINGS) ×1
CLSR STERI-STRIP ANTIMIC 1/2X4 (GAUZE/BANDAGES/DRESSINGS) ×4 IMPLANT
COVER BACK TABLE 60X90IN (DRAPES) ×3 IMPLANT
COVER MAYO STAND STRL (DRAPES) ×3 IMPLANT
COVER PROBE W GEL 5X96 (DRAPES) ×3 IMPLANT
DECANTER SPIKE VIAL GLASS SM (MISCELLANEOUS) IMPLANT
DEVICE DUBIN W/COMP PLATE 8390 (MISCELLANEOUS) ×3 IMPLANT
DRAPE LAPAROSCOPIC ABDOMINAL (DRAPES) ×3 IMPLANT
DRAPE UTILITY XL STRL (DRAPES) ×3 IMPLANT
ELECT COATED BLADE 2.86 ST (ELECTRODE) ×3 IMPLANT
ELECT REM PT RETURN 9FT ADLT (ELECTROSURGICAL) ×3
ELECTRODE REM PT RTRN 9FT ADLT (ELECTROSURGICAL) ×1 IMPLANT
GLOVE BIO SURGEON STRL SZ7 (GLOVE) ×6 IMPLANT
GLOVE BIOGEL PI IND STRL 7.5 (GLOVE) ×1 IMPLANT
GLOVE BIOGEL PI INDICATOR 7.5 (GLOVE) ×2
GOWN STRL REUS W/ TWL LRG LVL3 (GOWN DISPOSABLE) ×2 IMPLANT
GOWN STRL REUS W/TWL LRG LVL3 (GOWN DISPOSABLE) ×4
ILLUMINATOR WAVEGUIDE N/F (MISCELLANEOUS) ×3 IMPLANT
KIT MARKER MARGIN INK (KITS) ×3 IMPLANT
LIGHT WAVEGUIDE WIDE FLAT (MISCELLANEOUS) IMPLANT
LIQUID BAND (GAUZE/BANDAGES/DRESSINGS) ×6 IMPLANT
NDL SAFETY ECLIPSE 18X1.5 (NEEDLE) IMPLANT
NEEDLE HYPO 18GX1.5 SHARP (NEEDLE)
NEEDLE HYPO 25X1 1.5 SAFETY (NEEDLE) ×3 IMPLANT
NS IRRIG 1000ML POUR BTL (IV SOLUTION) IMPLANT
PACK BASIN DAY SURGERY FS (CUSTOM PROCEDURE TRAY) ×3 IMPLANT
PENCIL BUTTON HOLSTER BLD 10FT (ELECTRODE) ×3 IMPLANT
SHEET MEDIUM DRAPE 40X70 STRL (DRAPES) IMPLANT
SLEEVE SCD COMPRESS KNEE MED (MISCELLANEOUS) ×3 IMPLANT
SPONGE LAP 4X18 X RAY DECT (DISPOSABLE) ×3 IMPLANT
STOCKINETTE IMPERVIOUS LG (DRAPES) ×3 IMPLANT
STRIP CLOSURE SKIN 1/2X4 (GAUZE/BANDAGES/DRESSINGS) ×2 IMPLANT
SUT ETHILON 2 0 FS 18 (SUTURE) IMPLANT
SUT MNCRL AB 4-0 PS2 18 (SUTURE) ×3 IMPLANT
SUT MON AB 5-0 PS2 18 (SUTURE) IMPLANT
SUT SILK 2 0 SH (SUTURE) IMPLANT
SUT VIC AB 2-0 SH 27 (SUTURE) ×2
SUT VIC AB 2-0 SH 27XBRD (SUTURE) ×1 IMPLANT
SUT VIC AB 3-0 SH 27 (SUTURE) ×2
SUT VIC AB 3-0 SH 27X BRD (SUTURE) ×1 IMPLANT
SUT VIC AB 5-0 PS2 18 (SUTURE) ×3 IMPLANT
SYR CONTROL 10ML LL (SYRINGE) ×3 IMPLANT
TOWEL OR 17X24 6PK STRL BLUE (TOWEL DISPOSABLE) ×3 IMPLANT
TOWEL OR NON WOVEN STRL DISP B (DISPOSABLE) ×3 IMPLANT
TUBE CONNECTING 20'X1/4 (TUBING)
TUBE CONNECTING 20X1/4 (TUBING) IMPLANT
YANKAUER SUCT BULB TIP NO VENT (SUCTIONS) ×3 IMPLANT

## 2015-05-31 NOTE — Anesthesia Postprocedure Evaluation (Signed)
Anesthesia Post Note  Patient: Lindsey Barrett  Procedure(s) Performed: Procedure(s) (LRB): LEFT BREAST RADIOACTIVE SEED GUIDED LUMPECTOMY WITH LEFT AXILLARY SENTINEL LYMPH NODE BIOPSY (Left)  Patient location during evaluation: PACU Anesthesia Type: General and Regional Level of consciousness: awake and alert Pain management: pain level controlled Vital Signs Assessment: post-procedure vital signs reviewed and stable Respiratory status: spontaneous breathing, nonlabored ventilation, respiratory function stable and patient connected to nasal cannula oxygen Cardiovascular status: blood pressure returned to baseline and stable Postop Assessment: no signs of nausea or vomiting Anesthetic complications: no    Last Vitals:  Filed Vitals:   05/31/15 1340 05/31/15 1345  BP: 135/73 145/70  Pulse: 64 71  Temp:    Resp: 12 14    Last Pain: There were no vitals filed for this visit.               Zenaida Deed

## 2015-05-31 NOTE — Anesthesia Preprocedure Evaluation (Addendum)
Anesthesia Evaluation  Patient identified by MRN, date of birth, ID band Patient awake    Reviewed: Allergy & Precautions, H&P , NPO status , Patient's Chart, lab work & pertinent test results  History of Anesthesia Complications (+) history of anesthetic complications  Airway Mallampati: II  TM Distance: >3 FB Neck ROM: full    Dental no notable dental hx.    Pulmonary former smoker,    Pulmonary exam normal breath sounds clear to auscultation       Cardiovascular negative cardio ROS Normal cardiovascular exam Rhythm:regular Rate:Normal     Neuro/Psych  Headaches, Anxiety    GI/Hepatic negative GI ROS, Neg liver ROS, hiatal hernia, GERD  ,  Endo/Other  diabetes  Renal/GU negative Renal ROS     Musculoskeletal  (+) Arthritis ,   Abdominal   Peds  Hematology   Anesthesia Other Findings   Reproductive/Obstetrics negative OB ROS                            Anesthesia Physical Anesthesia Plan  ASA: II  Anesthesia Plan: General and Regional   Post-op Pain Management: GA combined w/ Regional for post-op pain   Induction: Intravenous  Airway Management Planned: LMA  Additional Equipment:   Intra-op Plan:   Post-operative Plan: Extubation in OR  Informed Consent: I have reviewed the patients History and Physical, chart, labs and discussed the procedure including the risks, benefits and alternatives for the proposed anesthesia with the patient or authorized representative who has indicated his/her understanding and acceptance.   Dental Advisory Given  Plan Discussed with: Anesthesiologist, CRNA and Surgeon  Anesthesia Plan Comments:        Anesthesia Quick Evaluation

## 2015-05-31 NOTE — Interval H&P Note (Signed)
History and Physical Interval Note:  05/31/2015 12:33 PM  Lindsey Barrett  has presented today for surgery, with the diagnosis of left breast cancer    The various methods of treatment have been discussed with the patient and family. After consideration of risks, benefits and other options for treatment, the patient has consented to  Procedure(s): LEFT BREAST RADIOACTIVE SEED GUIDED LUMPECTOMY WITH LEFT AXILLARY SENTINEL LYMPH NODE BIOPSY (Left) as a surgical intervention .  The patient's history has been reviewed, patient examined, no change in status, stable for surgery.  I have reviewed the patient's chart and labs.  Questions were answered to the patient's satisfaction.     Lindsey Barrett

## 2015-05-31 NOTE — Interval H&P Note (Signed)
History and Physical Interval Note:  05/31/2015 12:24 PM  Lindsey Barrett  has presented today for surgery, with the diagnosis of left breast cancer    The various methods of treatment have been discussed with the patient and family. After consideration of risks, benefits and other options for treatment, the patient has consented to  Procedure(s): LEFT BREAST RADIOACTIVE SEED GUIDED LUMPECTOMY WITH LEFT AXILLARY SENTINEL LYMPH NODE BIOPSY (Left) as a surgical intervention .  The patient's history has been reviewed, patient examined, no change in status, stable for surgery.  I have reviewed the patient's chart and labs.  Questions were answered to the patient's satisfaction.     Prophet Renwick

## 2015-05-31 NOTE — Anesthesia Procedure Notes (Addendum)
Anesthesia Regional Block:  Pectoralis block  Pre-Anesthetic Checklist: ,, timeout performed, Correct Patient, Correct Site, Correct Laterality, Correct Procedure, Correct Position, site marked, Risks and benefits discussed,  Surgical consent,  Pre-op evaluation,  At surgeon's request and post-op pain management  Laterality: Left  Prep: chloraprep       Needles:  Injection technique: Single-shot  Needle Type: Echogenic Stimulator Needle     Needle Length: 9cm 9 cm Needle Gauge: 21 and 21 G    Additional Needles:  Procedures: ultrasound guided (picture in chart) Pectoralis block Narrative:  Injection made incrementally with aspirations every 5 mL.  Performed by: Personally  Anesthesiologist: JUDD, BENJAMIN  Additional Notes: Risks, benefits and alternative to block explained extensively.  Patient tolerated procedure well, without complications.   Procedure Name: LMA Insertion Date/Time: 05/31/2015 2:02 PM Performed by: Lieutenant Diego Pre-anesthesia Checklist: Patient identified, Emergency Drugs available, Suction available and Patient being monitored Patient Re-evaluated:Patient Re-evaluated prior to inductionOxygen Delivery Method: Circle System Utilized Preoxygenation: Pre-oxygenation with 100% oxygen Intubation Type: IV induction Ventilation: Mask ventilation without difficulty LMA: LMA inserted LMA Size: 4.0 Number of attempts: 1 Airway Equipment and Method: Bite block Placement Confirmation: positive ETCO2 and breath sounds checked- equal and bilateral Tube secured with: Tape Dental Injury: Teeth and Oropharynx as per pre-operative assessment

## 2015-05-31 NOTE — Op Note (Signed)
Preoperative diagnosis: stage I left breast cancer Postoperative diagnosis: same as above Procedure: 1. Left breast seed guided lumpectomy 2. Left axillary sn biopsy Surgeon Dr Serita Grammes Anes general with pectoral block EBL: minimal Comps none Specimen:  1. Left breast marked with paint 2. Sentinel nodes with highest count of 171 Sponge count correct at completion dispo to recovery stable  Indications: this is a 65 yof who has clinical stage I left breast cancer. We discussed proceeding with lumpectomy/sn biopsy. She had radioactive seed placed prior to beginning. I had these mm in the OR.   Procedure: After informed consent was obtained the patient was taken to the OR. She was injected with technetium in the standard periareolar fashion. She underwent a pectoral block. She was given ancef. SCDs were in place. She was prepped and draped in the standard sterile surgical fashion. A timeout was performed. I then made a perareolar incision. I used the lighted retractors to tunnel to the lesion superiorly.  I then removed the seed with an attempt to get a clear margin.  I did confirm removal of the clip with mammography.I removed the seed separately as it was in a hematoma and I wanted to maintain control of it. I removed some additional medial tissue to clear this margin as it appeared close. I placed clips in the cavity. .  I closed the deep tissue with 2-0 vicryl. The superficial tissue was closedwith 3-0 vicryl and the skin was then closed with 4-0 monocryl Glue and steristrips were applied.  I made an axillary incision.  I went through the axillary fascia. I was able to locate what appeared to be a sentinel node with highest count listed above.  There were no more palpable or radioactive nodes present. I obtained hemostasis. I then closed the fascia with 2-0 vicryl The skin was closed with 3-0 vicryl and 4-0 monocryl. Glue and steristrips were placed A breast binder was  placed. She was extubated and transferred to recovery stable

## 2015-05-31 NOTE — Telephone Encounter (Signed)
Appointments made and left a message with follow up per pof

## 2015-05-31 NOTE — Progress Notes (Signed)
Assisted nuc med tech # [id number] with nuc med inj. Side rails up, monitors on throughout procedure. See vital signs in flow sheet. Tolerated Procedure well. 

## 2015-05-31 NOTE — Transfer of Care (Signed)
Immediate Anesthesia Transfer of Care Note  Patient: Lindsey Barrett  Procedure(s) Performed: Procedure(s): LEFT BREAST RADIOACTIVE SEED GUIDED LUMPECTOMY WITH LEFT AXILLARY SENTINEL LYMPH NODE BIOPSY (Left)  Patient Location: PACU  Anesthesia Type:GA combined with regional for post-op pain  Level of Consciousness: awake, alert  and oriented  Airway & Oxygen Therapy: Patient Spontanous Breathing and Patient connected to nasal cannula oxygen  Post-op Assessment: Report given to RN and Post -op Vital signs reviewed and stable  Post vital signs: Reviewed and stable  Last Vitals:  Filed Vitals:   05/31/15 1340 05/31/15 1345  BP: 135/73 145/70  Pulse: 64 71  Temp:    Resp: 12 14    Complications: No apparent anesthesia complications

## 2015-05-31 NOTE — Telephone Encounter (Signed)
Called and left a message with follow up per pof

## 2015-05-31 NOTE — Discharge Instructions (Signed)
[facility] °Office Phone Number [phone] ° °POST OP INSTRUCTIONS ° °Always review your discharge instruction sheet given to you by the facility where your surgery was performed. ° °IF YOU HAVE DISABILITY OR FAMILY LEAVE FORMS, YOU MUST BRING THEM TO THE OFFICE FOR PROCESSING.  DO NOT GIVE THEM TO YOUR DOCTOR. ° °1. A prescription for pain medication may be given to you upon discharge.  Take your pain medication as prescribed, if needed.  If narcotic pain medicine is not needed, then you may take acetaminophen (Tylenol), naprosyn (Alleve) or ibuprofen (Advil) as needed. °2. Take your usually prescribed medications unless otherwise directed °3. If you need a refill on your pain medication, please contact your pharmacy.  They will contact our office to request authorization.  Prescriptions will not be filled after 5pm or on week-ends. °4. You should eat very light the first 24 hours after surgery, such as soup, crackers, pudding, etc.  Resume your normal diet the day after surgery. °5. Most patients will experience some swelling and bruising in the breast.  Ice packs and a good support bra will help.  Wear the breast binder provided or a sports bra for 72 hours day and night.  After that wear a sports bra during the day until you return to the office. Swelling and bruising can take several days to resolve.  °6. It is common to experience some constipation if taking pain medication after surgery.  Increasing fluid intake and taking a stool softener will usually help or prevent this problem from occurring.  A mild laxative (Milk of Magnesia or Miralax) should be taken according to package directions if there are no bowel movements after 48 hours. °7. Unless discharge instructions indicate otherwise, you may remove your bandages 48 hours after surgery and you may shower at that time.  You may have steri-strips (small skin tapes) in place directly over the incision.  These strips should be left on the  skin for 7-10 days and will come off on their own.  If your surgeon used skin glue on the incision, you may shower in 24 hours.  The glue will flake off over the next 2-3 weeks.  Any sutures or staples will be removed at the office during your follow-up visit. °8. ACTIVITIES:  You may resume regular daily activities (gradually increasing) beginning the next day.  Wearing a good support bra or sports bra minimizes pain and swelling.  You may have sexual intercourse when it is comfortable. °a. You may drive when you no longer are taking prescription pain medication, you can comfortably wear a seatbelt, and you can safely maneuver your car and apply brakes. °b. RETURN TO WORK:  ______________________________________________________________________________________ °9. You should see your doctor in the office for a follow-up appointment approximately two weeks after your surgery.  Your doctor’s nurse will typically make your follow-up appointment when she calls you with your pathology report.  Expect your pathology report 3-4 business days after your surgery.  You may call to check if you do not hear from us after three days. °10. OTHER INSTRUCTIONS: _______________________________________________________________________________________________ _____________________________________________________________________________________________________________________________________ °_____________________________________________________________________________________________________________________________________ °_____________________________________________________________________________________________________________________________________ ° °WHEN TO CALL DR WAKEFIELD: °1. Fever over 101.0 °2. Nausea and/or vomiting. °3. Extreme swelling or bruising. °4. Continued bleeding from incision. °5. Increased pain, redness, or drainage from the incision. ° °The clinic staff is available to answer your questions during regular  business hours.  Please don’t hesitate to call and ask to speak to one of the nurses for clinical concerns.  If   you have a medical emergency, go to the nearest emergency room or call 911.  A surgeon from [facility] is always on call at the hospital. ° °For further questions, please visit [email or web address] ° ° ° °Post Anesthesia Home Care Instructions ° °Activity: °Get plenty of rest for the remainder of the day. A responsible adult should stay with you for 24 hours following the procedure.  °For the next 24 hours, DO NOT: °-Drive a car °-Operate machinery °-Drink alcoholic beverages °-Take any medication unless instructed by your physician °-Make any legal decisions or sign important papers. ° °Meals: °Start with liquid foods such as gelatin or soup. Progress to regular foods as tolerated. Avoid greasy, spicy, heavy foods. If nausea and/or vomiting occur, drink only clear liquids until the nausea and/or vomiting subsides. Call your physician if vomiting continues. ° °Special Instructions/Symptoms: °Your throat may feel dry or sore from the anesthesia or the breathing tube placed in your throat during surgery. If this causes discomfort, gargle with warm salt water. The discomfort should disappear within 24 hours. ° °If you had a scopolamine patch placed behind your ear for the management of post- operative nausea and/or vomiting: ° °1. The medication in the patch is effective for 72 hours, after which it should be removed.  Wrap patch in a tissue and discard in the trash. Wash hands thoroughly with soap and water. °2. You may remove the patch earlier than 72 hours if you experience unpleasant side effects which may include dry mouth, dizziness or visual disturbances. °3. Avoid touching the patch. Wash your hands with soap and water after contact with the patch. °  ° °

## 2015-05-31 NOTE — Progress Notes (Signed)
Assisted Dr. B. Judd with left, ultrasound guided, pectoralis block. Side rails up, monitors on throughout procedure. See vital signs in flow sheet. Tolerated Procedure well. 

## 2015-05-31 NOTE — H&P (Signed)
4 yof who has history of multiple cyst aspirations, she is here with former patient Thayer Headings B of mine, presents after undergoing screening mm that shows a left breast asymmetry. her density is c. she has a 1.3 cm irregular mass by [place] in the left breast uoq. the axilla is normal by [place]. this underwent core biopsy that shows a grade I-II IDC with er pos at 100, pr at 95, her2 negative and Ki is 5%. she has no complaints referable to either breast. she works as Environmental health practitioner for [employer]. she lives alone but has a lot of help from friends. she has no mass or discharge   Other Problems Conni Slipper, RN; 05/24/2015 4:43 PM) Anxiety Disorder Diverticulosis Gastroesophageal Reflux Disease Heart murmur Hypercholesterolemia Lump In Breast Migraine Headache  Past Surgical History Conni Slipper, RN; 05/24/2015 4:43 PM) Appendectomy Breast Biopsy Left.  Diagnostic Studies History Conni Slipper, RN; 05/24/2015 4:43 PM) Colonoscopy >10 years ago Mammogram within last year Pap Smear 1-5 years ago  Medication History Conni Slipper, RN; 05/24/2015 4:43 PM) No Current Medications Medications Reconciled  Social History Conni Slipper, RN; 05/24/2015 4:43 PM) Alcohol use Moderate alcohol use. No caffeine use No drug use Tobacco use Former smoker.  Family History Conni Slipper, RN; 05/24/2015 4:43 PM) Cancer Father, Mother. Heart Disease Father.  Pregnancy / Birth History Conni Slipper, RN; 05/24/2015 4:43 PM) Age at menarche 62 years. Age of menopause 1-55 Gravida 0 Para 0    Review of Systems Conni Slipper RN; 05/24/2015 4:43 PM) General Not Present- Appetite Loss, Chills, Fatigue, Fever, Night Sweats, Weight Gain and Weight Loss. Skin Not Present- Change in Wart/Mole, Dryness, Hives, Jaundice, New Lesions, Non-Healing Wounds, Rash and Ulcer. HEENT Present- Visual Disturbances and Wears glasses/contact lenses. Not Present- Earache, Hearing Loss, Hoarseness, Nose Bleed,  Oral Ulcers, Ringing in the Ears, Seasonal Allergies, Sinus Pain, Sore Throat and Yellow Eyes. Respiratory Not Present- Bloody sputum, Chronic Cough, Difficulty Breathing, Snoring and Wheezing. Breast Not Present- Breast Mass, Breast Pain, Nipple Discharge and Skin Changes. Cardiovascular Present- Palpitations and Rapid Heart Rate. Not Present- Chest Pain, Difficulty Breathing Lying Down, Leg Cramps, Shortness of Breath and Swelling of Extremities. Gastrointestinal Present- Indigestion. Not Present- Abdominal Pain, Bloating, Bloody Stool, Change in Bowel Habits, Chronic diarrhea, Constipation, Difficulty Swallowing, Excessive gas, Gets full quickly at meals, Hemorrhoids, Nausea, Rectal Pain and Vomiting. Female Genitourinary Not Present- Frequency, Nocturia, Painful Urination, Pelvic Pain and Urgency. Musculoskeletal Not Present- Back Pain, Joint Pain, Joint Stiffness, Muscle Pain, Muscle Weakness and Swelling of Extremities. Neurological Not Present- Decreased Memory, Fainting, Headaches, Numbness, Seizures, Tingling, Tremor, Trouble walking and Weakness. Psychiatric Present- Anxiety. Not Present- Bipolar, Change in Sleep Pattern, Depression, Fearful and Frequent crying. Endocrine Present- Hot flashes. Not Present- Cold Intolerance, Excessive Hunger, Hair Changes, Heat Intolerance and New Diabetes. Hematology Not Present- Easy Bruising, Excessive bleeding, Gland problems, HIV and Persistent Infections.   Physical Exam [name] MD; 05/26/2015 8:33 AM) General Mental Status-Alert. Orientation-Oriented X3.  Chest and Lung Exam Chest and lung exam reveals -on auscultation, normal breath sounds, no adventitious sounds and normal vocal resonance.  Breast Nipples-No Discharge. Breast Lump-No Palpable Breast Mass.  Cardiovascular Cardiovascular examination reveals -normal heart sounds, regular rate and rhythm with no murmurs.  Lymphatic Head & Neck  General Head & Neck  Lymphatics: Bilateral - Description - Normal. Axillary  General Axillary Region: Bilateral - Description - Normal. Note: no Piru adenopathy     Assessment & Plan [name] MD; 05/26/2015 8:36 AM) BREAST CANCER  OF UPPER-OUTER QUADRANT OF LEFT FEMALE BREAST (C50.412) Impression: Left breast seed guided lumpectomy, left axillary sentinel node biopsy We discussed the staging and pathophysiology of breast cancer. We discussed all of the different options for treatment for breast cancer including surgery, chemotherapy, radiation therapy, Herceptin, and antiestrogen therapy. We discussed a sentinel lymph node biopsy as she does not appear to having lymph node involvement right now. We discussed the performance of that with injection of radioactive tracer. We discussed that she would have an incision underneath her axillary hairline. We discussed that there is a chance of having a positive node with a sentinel lymph node biopsy and we will await the permanent pathology to make any other first further decisions in terms of her treatment. We discussed up to a 5% risk lifetime of chronic shoulder pain as well as lymphedema associated with a sentinel lymph node biopsy. We discussed the options for treatment of the breast cancer which included lumpectomy versus a mastectomy. We discussed the performance of the lumpectomy with radioactive seed placement. We discussed a 5% chance of a positive margin requiring reexcision in the operating room. We also discussed that she will need radiation therapy (this is usually 5-7 weeks) if she undergoes lumpectomy. We discussed the mastectomy (removal of whole breast) and the postoperative care for that as well. Mastectomy can be followed by reconstruction. The decision for lumpectomy vs mastectomy has no impact on decision for chemotherapy. Most mastectomy patients will not need radiation therapy. We discussed that there is no difference in her survival whether she  undergoes lumpectomy with radiation therapy versus a mastectomy. There is also no real difference between her recurrence in the breast. We discussed the risks of operation including bleeding, infection, possible reoperation. She understands her further therapy will be based on what her stages at the time of her operation.

## 2015-06-01 ENCOUNTER — Encounter (HOSPITAL_BASED_OUTPATIENT_CLINIC_OR_DEPARTMENT_OTHER): Payer: Self-pay | Admitting: General Surgery

## 2015-06-07 ENCOUNTER — Encounter: Payer: Self-pay | Admitting: Hematology and Oncology

## 2015-06-07 ENCOUNTER — Ambulatory Visit (HOSPITAL_BASED_OUTPATIENT_CLINIC_OR_DEPARTMENT_OTHER): Payer: Medicare Other | Admitting: Hematology and Oncology

## 2015-06-07 VITALS — BP 135/74 | HR 80 | Temp 97.5°F | Resp 98 | Ht 64.0 in | Wt 169.7 lb

## 2015-06-07 DIAGNOSIS — C50212 Malignant neoplasm of upper-inner quadrant of left female breast: Secondary | ICD-10-CM | POA: Diagnosis not present

## 2015-06-07 DIAGNOSIS — Z170 Estrogen receptor positive status [ER+]: Secondary | ICD-10-CM | POA: Diagnosis not present

## 2015-06-07 NOTE — Progress Notes (Signed)
Unable to get in to exam room prior to MD.  No assessment performed.  

## 2015-06-07 NOTE — Progress Notes (Signed)
Patient Care Team: Hendricks Limes, MD as PCP - General (Internal Medicine) [name], MD as Consulting Physician (Gynecology) Sylvan Cheese, NP as Nurse Practitioner (Hematology and Oncology)  DIAGNOSIS: Breast cancer of upper-inner quadrant of left female breast [facility])   Staging form: Breast, AJCC 7th Edition     Clinical stage from 05/25/2015: Stage IA (T1c, N0, M0) - Unsigned       Staging comments: Staged at breast conference on 1.18.17  SUMMARY OF ONCOLOGIC HISTORY:   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[place])   05/18/2015 Initial Diagnosis Left breast biopsy: Invasive ductal carcinoma low to intermediate grade, ER 100 pleasant, P a 95%, HER-2 negative ratio 1.02, Ki-67 5%, left breast asymmetry 1.3 cm, T1c N0 stage IA   05/31/2015 Surgery Left lumpectomy: Invasive ductal carcinoma grade 2, 1.5 cm, 0/3 lymph nodes negative, broadly involves inferior margin, ER 100%, PR 95%, HER-2 negative duration 112, Ki-67 5%, T1 cN0 stage IA    CHIEF COMPLIANT:  Follow-up after recent lumpectomy  INTERVAL HISTORY: Lindsey Barrett is a  70 year old with above-mentioned history of left breast cancer treated with lumpectomy and is here to discuss pathology report. She has healed very well from the surgery and is not using any pain medication for the breast. She is very uncomfortable under the arm. She feels mild numbness in the hand but it has been improving slowly over time.  REVIEW OF SYSTEMS:   Constitutional: Denies fevers, chills or abnormal weight loss Eyes: Denies blurriness of vision Ears, nose, mouth, throat, and face: Denies mucositis or sore throat Respiratory: Denies cough, dyspnea or wheezes Cardiovascular: Denies palpitation, chest discomfort Gastrointestinal:  Denies nausea, heartburn or change in bowel habits Skin: Denies abnormal skin rashes Lymphatics: Denies new lymphadenopathy or easy bruising Neurological:Denies numbness, tingling  or new weaknesses Behavioral/Psych: Mood is stable, no new changes  Extremities: No lower extremity edema Breast:   Recent surgery All other systems were reviewed with the patient and are negative.  I have reviewed the past medical history, past surgical history, social history and family history with the patient and they are unchanged from previous note.  ALLERGIES:  is allergic to septra ds and adhesive.  MEDICATIONS:  Current Outpatient Prescriptions  Medication Sig Dispense Refill  . aspirin 81 MG tablet Take 81 mg by mouth as directed.     . bimatoprost (LUMIGAN) 0.01 % SOLN Place 1 drop into both eyes at bedtime.    . brinzolamide (AZOPT) 1 % ophthalmic suspension 1 drop 2 (two) times daily.    . calcium carbonate (TUMS - DOSED IN MG ELEMENTAL CALCIUM) 500 MG chewable tablet Chew 1 tablet by mouth as needed for indigestion or heartburn.    . Ergocalciferol (VITAMIN D2) 2000 UNITS TABS Take 1 tablet by mouth daily.    [name] HYDROcodone-acetaminophen (NORCO) 10-325 MG tablet Take 1 tablet by mouth every 6 (six) hours as needed. 20 tablet 0  . ibuprofen (ADVIL,MOTRIN) 200 MG tablet Take 200 mg by mouth every 6 (six) hours as needed.    . ranitidine (ZANTAC) 150 MG tablet Take 1 tablet (150 mg total) by mouth 2 (two) times daily. 60 tablet 0  . rosuvastatin (CRESTOR) 40 MG tablet Take 1 tablet (40 mg total) by mouth daily. 90 tablet 3  . [DISCONTINUED] metoprolol tartrate (LOPRESSOR) 25 MG tablet Take 1 tablet (25 mg total) by mouth as directed. 1 pill every 8 hours as needed for tachycardia or palpitations. 30 tablet 2  No current facility-administered medications for this visit.    PHYSICAL EXAMINATION: ECOG PERFORMANCE STATUS: 1 - Symptomatic but completely ambulatory  Filed Vitals:   06/07/15 1546  BP: 135/74  Pulse: 80  Temp: 97.5 F (36.4 C)  Resp: 98   Filed Weights   06/07/15 1546  Weight: 169 lb 11.2 oz (76.975 kg)    GENERAL:alert, no distress and  comfortable SKIN: skin color, texture, turgor are normal, no rashes or significant lesions EYES: normal, Conjunctiva are pink and non-injected, sclera clear OROPHARYNX:no exudate, no erythema and lips, buccal mucosa, and tongue normal  NECK: supple, thyroid normal size, non-tender, without nodularity LYMPH:  no palpable lymphadenopathy in the cervical, axillary or inguinal LUNGS: clear to auscultation and percussion with normal breathing effort HEART: regular rate & rhythm and no murmurs and no lower extremity edema ABDOMEN:abdomen soft, non-tender and normal bowel sounds MUSCULOSKELETAL:no cyanosis of digits and no clubbing  NEURO: alert & oriented x 3 with fluent speech, no focal motor/sensory deficits EXTREMITIES: No lower extremity edema   LABORATORY DATA:  I have reviewed the data as listed   Chemistry      Component Value Date/Time   NA 142 05/25/2015 1229   NA 142 12/25/2014 0420   K 4.4 05/25/2015 1229   K 3.8 12/25/2014 0420   CL 106 12/25/2014 0420   CO2 30* 05/25/2015 1229   CO2 27 12/25/2014 0420   BUN 12.9 05/25/2015 1229   BUN 16 12/25/2014 0420   CREATININE 0.8 05/25/2015 1229   CREATININE 0.57 12/25/2014 0420      Component Value Date/Time   CALCIUM 9.3 05/25/2015 1229   CALCIUM 8.7* 12/25/2014 0420   ALKPHOS 59 05/25/2015 1229   ALKPHOS 60 06/18/2014 1053   AST 22 05/25/2015 1229   AST 24 06/18/2014 1053   ALT 20 05/25/2015 1229   ALT 24 06/18/2014 1053   BILITOT 1.63* 05/25/2015 1229   BILITOT 1.1 06/18/2014 1053       Lab Results  Component Value Date   WBC 6.9 05/25/2015   HGB 13.4 05/25/2015   HCT 40.8 05/25/2015   MCV 87.0 05/25/2015   PLT 219 05/25/2015   NEUTROABS 4.7 05/25/2015     ASSESSMENT & PLAN:  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[place]) Left lumpectomy 05/31/2015: Invasive ductal carcinoma grade 2, 1.5 cm, 0/3 lymph nodes negative, broadly involves inferior margin, ER 100%, PR 95%, HER-2 negative  duration 112, Ki-67 5%, T1 cN0 stage IA  Pathology counseling: I discussed the final pathology report of the patient provided  a copy of this report. I discussed the margins as well as lymph node surgeries. We also discussed the final staging along with previously performed ER/PR and HER-2/neu testing.  Recommendation: Patient will need surgery for the positive inferior margin. This surgery is being delayed until we get Oncotype testing. If she needed chemotherapy then Dr. [name] would put a port at the same surgery.  1. Oncotype DX testing to determine if chemotherapy would be of any benefit followed by 2. Adjuvant radiation therapy followed by 3. Adjuvant antiestrogen therapy  Return to clinic based upon Oncotype DX test result   No orders of the defined types were placed in this encounter.   The patient has a good understanding of the overall plan. she agrees with it. she will call with any problems that may develop before the next visit here.   [name], MD 06/07/2015

## 2015-06-07 NOTE — Assessment & Plan Note (Signed)
Left lumpectomy 05/31/2015: Invasive ductal carcinoma grade 2, 1.5 cm, 0/3 lymph nodes negative, broadly involves inferior margin, ER 100%, PR 95%, HER-2 negative duration 112, Ki-67 5%, T1 cN0 stage IA  Pathology counseling: I discussed the final pathology report of the patient provided  a copy of this report. I discussed the margins as well as lymph node surgeries. We also discussed the final staging along with previously performed ER/PR and HER-2/neu testing.  Recommendation: 1. Oncotype DX testing to determine if chemotherapy would be of any benefit followed by 2. Adjuvant radiation therapy followed by 3. Adjuvant antiestrogen therapy  Return to clinic based upon Oncotype DX test result

## 2015-06-13 ENCOUNTER — Encounter: Payer: Self-pay | Admitting: Internal Medicine

## 2015-06-14 ENCOUNTER — Encounter: Payer: Self-pay | Admitting: *Deleted

## 2015-06-14 NOTE — Progress Notes (Signed)
Ordered oncotype per Dr. Gudena.  Faxed requisition to pathology and confirmed receipt. 

## 2015-06-21 DIAGNOSIS — C50212 Malignant neoplasm of upper-inner quadrant of left female breast: Secondary | ICD-10-CM | POA: Diagnosis not present

## 2015-06-22 ENCOUNTER — Telehealth: Payer: Self-pay | Admitting: *Deleted

## 2015-06-22 ENCOUNTER — Encounter (HOSPITAL_COMMUNITY): Payer: Self-pay

## 2015-06-22 NOTE — Telephone Encounter (Signed)
Received oncotype score of 18. Per Dr. Lindi Adie pt does not need chemotherapy. Called pt with results. Informed physician team. Dr. [name] will schedule re-excision. Informed pt once we have her sx date we will get her in with radiation oncology.

## 2015-06-23 ENCOUNTER — Other Ambulatory Visit: Payer: Self-pay | Admitting: General Surgery

## 2015-06-27 ENCOUNTER — Encounter (HOSPITAL_BASED_OUTPATIENT_CLINIC_OR_DEPARTMENT_OTHER): Payer: Self-pay | Admitting: *Deleted

## 2015-07-01 ENCOUNTER — Other Ambulatory Visit: Payer: Self-pay | Admitting: *Deleted

## 2015-07-01 ENCOUNTER — Ambulatory Visit (HOSPITAL_BASED_OUTPATIENT_CLINIC_OR_DEPARTMENT_OTHER): Payer: Medicare Other | Admitting: Certified Registered"

## 2015-07-01 ENCOUNTER — Encounter (HOSPITAL_BASED_OUTPATIENT_CLINIC_OR_DEPARTMENT_OTHER): Payer: Self-pay | Admitting: Certified Registered"

## 2015-07-01 ENCOUNTER — Encounter: Payer: Medicare Other | Admitting: Internal Medicine

## 2015-07-01 ENCOUNTER — Ambulatory Visit (HOSPITAL_BASED_OUTPATIENT_CLINIC_OR_DEPARTMENT_OTHER)
Admission: RE | Admit: 2015-07-01 | Discharge: 2015-07-01 | Disposition: A | Discharge status: Home / Self Care | Payer: Medicare Other | Source: Ambulatory Visit | Attending: General Surgery | Admitting: General Surgery

## 2015-07-01 ENCOUNTER — Encounter (HOSPITAL_BASED_OUTPATIENT_CLINIC_OR_DEPARTMENT_OTHER)
Admission: RE | Disposition: A | Discharge status: Home / Self Care | Payer: Self-pay | Source: Ambulatory Visit | Attending: General Surgery

## 2015-07-01 DIAGNOSIS — Z87891 Personal history of nicotine dependence: Secondary | ICD-10-CM | POA: Diagnosis not present

## 2015-07-01 DIAGNOSIS — C50419 Malignant neoplasm of upper-outer quadrant of unspecified female breast: Secondary | ICD-10-CM | POA: Diagnosis not present

## 2015-07-01 DIAGNOSIS — C50912 Malignant neoplasm of unspecified site of left female breast: Secondary | ICD-10-CM | POA: Insufficient documentation

## 2015-07-01 DIAGNOSIS — N6012 Diffuse cystic mastopathy of left breast: Secondary | ICD-10-CM | POA: Diagnosis not present

## 2015-07-01 HISTORY — PX: RE-EXCISION OF BREAST LUMPECTOMY: SHX6048

## 2015-07-01 SURGERY — EXCISION, LESION, BREAST
Anesthesia: General | Site: Breast | Laterality: Left

## 2015-07-01 MED ORDER — IBUPROFEN 200 MG PO TABS
ORAL_TABLET | ORAL | Status: AC
  Filled 2015-07-01: qty 1

## 2015-07-01 MED ORDER — GLYCOPYRROLATE 0.2 MG/ML IJ SOLN: 0.2000 mg | Freq: Once | INTRAMUSCULAR | Status: DC | PRN

## 2015-07-01 MED ORDER — HYDROMORPHONE HCL 1 MG/ML IJ SOLN: 0.2500 mg | INTRAMUSCULAR | Status: DC | PRN

## 2015-07-01 MED ORDER — LIDOCAINE HCL (CARDIAC) 20 MG/ML IV SOLN
INTRAVENOUS | Status: AC
  Filled 2015-07-01: qty 5

## 2015-07-01 MED ORDER — FENTANYL CITRATE (PF) 100 MCG/2ML IJ SOLN
INTRAMUSCULAR | Status: AC
Start: 2015-07-01 — End: 2015-07-01
  Filled 2015-07-01: qty 2

## 2015-07-01 MED ORDER — ONDANSETRON HCL 4 MG/2ML IJ SOLN
INTRAMUSCULAR | Status: AC
  Filled 2015-07-01: qty 2

## 2015-07-01 MED ORDER — BUPIVACAINE HCL (PF) 0.25 % IJ SOLN
INTRAMUSCULAR | Status: DC | PRN
  Administered 2015-07-01: 10 mL

## 2015-07-01 MED ORDER — CEFAZOLIN SODIUM-DEXTROSE 2-3 GM-% IV SOLR
INTRAVENOUS | Status: AC
  Filled 2015-07-01: qty 50

## 2015-07-01 MED ORDER — DEXAMETHASONE SODIUM PHOSPHATE 4 MG/ML IJ SOLN
INTRAMUSCULAR | Status: DC | PRN
  Administered 2015-07-01: 10 mg via INTRAVENOUS

## 2015-07-01 MED ORDER — FENTANYL CITRATE (PF) 100 MCG/2ML IJ SOLN
50.0000 ug | INTRAMUSCULAR | Status: DC | PRN
  Administered 2015-07-01: 100 ug via INTRAVENOUS

## 2015-07-01 MED ORDER — CEFAZOLIN SODIUM-DEXTROSE 2-3 GM-% IV SOLR
2.0000 g | INTRAVENOUS | Status: AC
  Administered 2015-07-01: 2 g via INTRAVENOUS

## 2015-07-01 MED ORDER — MIDAZOLAM HCL 2 MG/2ML IJ SOLN: 1.0000 mg | INTRAMUSCULAR | Status: DC | PRN

## 2015-07-01 MED ORDER — ONDANSETRON HCL 4 MG/2ML IJ SOLN
INTRAMUSCULAR | Status: DC | PRN
  Administered 2015-07-01: 4 mg via INTRAVENOUS

## 2015-07-01 MED ORDER — SCOPOLAMINE 1 MG/3DAYS TD PT72: 1.0000 | MEDICATED_PATCH | Freq: Once | TRANSDERMAL | Status: DC | PRN

## 2015-07-01 MED ORDER — LIDOCAINE HCL (CARDIAC) 20 MG/ML IV SOLN
INTRAVENOUS | Status: DC | PRN
  Administered 2015-07-01: 60 mg via INTRAVENOUS

## 2015-07-01 MED ORDER — MEPERIDINE HCL 25 MG/ML IJ SOLN: 6.2500 mg | INTRAMUSCULAR | Status: DC | PRN

## 2015-07-01 MED ORDER — IBUPROFEN 200 MG PO TABS
200.0000 mg | ORAL_TABLET | Freq: Four times a day (QID) | ORAL | Status: DC | PRN
Start: 2015-07-01 — End: 2015-07-01
  Administered 2015-07-01: 200 mg via ORAL

## 2015-07-01 MED ORDER — LACTATED RINGERS IV SOLN
INTRAVENOUS | Status: DC
  Administered 2015-07-01 (×2): via INTRAVENOUS

## 2015-07-01 MED ORDER — IBUPROFEN 100 MG/5ML PO SUSP: 200.0000 mg | Freq: Four times a day (QID) | ORAL | Status: DC | PRN

## 2015-07-01 MED ORDER — DEXAMETHASONE SODIUM PHOSPHATE 10 MG/ML IJ SOLN
INTRAMUSCULAR | Status: AC
  Filled 2015-07-01: qty 1

## 2015-07-01 MED ORDER — ONDANSETRON HCL 4 MG/2ML IJ SOLN: 4.0000 mg | Freq: Once | INTRAMUSCULAR | Status: DC | PRN

## 2015-07-01 MED ORDER — PROPOFOL 10 MG/ML IV BOLUS
INTRAVENOUS | Status: DC | PRN
  Administered 2015-07-01: 150 mg via INTRAVENOUS

## 2015-07-01 SURGICAL SUPPLY — 55 items
APPLIER CLIP 9.375 MED OPEN (MISCELLANEOUS)
BINDER BREAST LRG (GAUZE/BANDAGES/DRESSINGS) IMPLANT
BINDER BREAST MEDIUM (GAUZE/BANDAGES/DRESSINGS) IMPLANT
BINDER BREAST XLRG (GAUZE/BANDAGES/DRESSINGS) ×3 IMPLANT
BINDER BREAST XXLRG (GAUZE/BANDAGES/DRESSINGS) IMPLANT
BLADE SURG 15 STRL LF DISP TIS (BLADE) ×1 IMPLANT
BLADE SURG 15 STRL SS (BLADE) ×2
CANISTER SUCT 1200ML W/VALVE (MISCELLANEOUS) ×3 IMPLANT
CHLORAPREP W/TINT 26ML (MISCELLANEOUS) ×3 IMPLANT
CLIP APPLIE 9.375 MED OPEN (MISCELLANEOUS) IMPLANT
CLOSURE WOUND 1/2 X4 (GAUZE/BANDAGES/DRESSINGS) ×1
COVER BACK TABLE 60X90IN (DRAPES) ×3 IMPLANT
COVER MAYO STAND STRL (DRAPES) ×3 IMPLANT
DECANTER SPIKE VIAL GLASS SM (MISCELLANEOUS) IMPLANT
DRAPE LAPAROSCOPIC ABDOMINAL (DRAPES) ×3 IMPLANT
DRSG TEGADERM 4X4.75 (GAUZE/BANDAGES/DRESSINGS) IMPLANT
ELECT COATED BLADE 2.86 ST (ELECTRODE) ×3 IMPLANT
ELECT REM PT RETURN 9FT ADLT (ELECTROSURGICAL) ×3
ELECTRODE REM PT RTRN 9FT ADLT (ELECTROSURGICAL) ×1 IMPLANT
GLOVE BIO SURGEON STRL SZ7 (GLOVE) ×3 IMPLANT
GLOVE BIOGEL PI IND STRL 7.0 (GLOVE) ×1 IMPLANT
GLOVE BIOGEL PI IND STRL 7.5 (GLOVE) ×1 IMPLANT
GLOVE BIOGEL PI INDICATOR 7.0 (GLOVE) ×2
GLOVE BIOGEL PI INDICATOR 7.5 (GLOVE) ×2
GLOVE SURG SS PI 7.0 STRL IVOR (GLOVE) ×3 IMPLANT
GOWN STRL REUS W/ TWL LRG LVL3 (GOWN DISPOSABLE) ×2 IMPLANT
GOWN STRL REUS W/TWL LRG LVL3 (GOWN DISPOSABLE) ×4
ILLUMINATOR WAVEGUIDE N/F (MISCELLANEOUS) IMPLANT
KIT MARKER MARGIN INK (KITS) ×3 IMPLANT
LIGHT WAVEGUIDE WIDE FLAT (MISCELLANEOUS) IMPLANT
LIQUID BAND (GAUZE/BANDAGES/DRESSINGS) ×3 IMPLANT
NEEDLE HYPO 25X1 1.5 SAFETY (NEEDLE) ×3 IMPLANT
NS IRRIG 1000ML POUR BTL (IV SOLUTION) ×3 IMPLANT
PACK BASIN DAY SURGERY FS (CUSTOM PROCEDURE TRAY) ×3 IMPLANT
PENCIL BUTTON HOLSTER BLD 10FT (ELECTRODE) ×3 IMPLANT
SLEEVE SCD COMPRESS KNEE MED (MISCELLANEOUS) ×3 IMPLANT
SPONGE GAUZE 4X4 12PLY STER LF (GAUZE/BANDAGES/DRESSINGS) ×3 IMPLANT
SPONGE LAP 4X18 X RAY DECT (DISPOSABLE) ×3 IMPLANT
STRIP CLOSURE SKIN 1/2X4 (GAUZE/BANDAGES/DRESSINGS) ×2 IMPLANT
SUT MNCRL AB 3-0 PS2 18 (SUTURE) IMPLANT
SUT MNCRL AB 4-0 PS2 18 (SUTURE) IMPLANT
SUT MON AB 5-0 PS2 18 (SUTURE) ×3 IMPLANT
SUT SILK 2 0 SH (SUTURE) ×3 IMPLANT
SUT VIC AB 2-0 SH 27 (SUTURE) ×4
SUT VIC AB 2-0 SH 27XBRD (SUTURE) ×2 IMPLANT
SUT VIC AB 3-0 SH 27 (SUTURE) ×2
SUT VIC AB 3-0 SH 27X BRD (SUTURE) ×1 IMPLANT
SUT VIC AB 5-0 PS2 18 (SUTURE) IMPLANT
SUT VICRYL AB 3 0 TIES (SUTURE) IMPLANT
SYR CONTROL 10ML LL (SYRINGE) ×3 IMPLANT
TOWEL OR 17X24 6PK STRL BLUE (TOWEL DISPOSABLE) ×3 IMPLANT
TOWEL OR NON WOVEN STRL DISP B (DISPOSABLE) IMPLANT
TUBE CONNECTING 20'X1/4 (TUBING) ×1
TUBE CONNECTING 20X1/4 (TUBING) ×2 IMPLANT
YANKAUER SUCT BULB TIP NO VENT (SUCTIONS) ×3 IMPLANT

## 2015-07-01 NOTE — H&P (Signed)
  80 yof who has history of multiple cyst aspirations, she is here with former patient Thayer Headings B of mine, presents after undergoing screening mm that shows a left breast asymmetry. her density is c. she has a 1.3 cm irregular mass by [place] in the left breast uoq. the axilla is normal by [place]. this underwent core biopsy that shows a grade I-II IDC with er pos at 100, pr at 95, her2 negative and Ki is 5%. she has no complaints referable to either breast. she works as Environmental health practitioner for [employer]. she lives alone but has a lot of help from friends. she has no mass or discharge. She underwent lump/sn with positive inferior margin, oncotype 18. Will not get chemo, needs inferior margin cleared. No issues with first surgery  Other Problems Anxiety Disorder Diverticulosis Gastroesophageal Reflux Disease Heart murmur Hypercholesterolemia Lump In Breast Migraine Headache  Past Surgical History  Appendectomy Breast Biopsy Left. Left breast lump/sn  Medication History  No Current Medications Medications Reconciled  Social History  Alcohol use Moderate alcohol use. No caffeine use No drug use Tobacco use Former smoker.  Review of Systems  General Not Present- Appetite Loss, Chills, Fatigue, Fever, Night Sweats, Weight Gain and Weight Loss. Skin Not Present- Change in Wart/Mole, Dryness, Hives, Jaundice, New Lesions, Non-Healing Wounds, Rash and Ulcer. HEENT Present- Visual Disturbances and Wears glasses/contact lenses. Not Present- Earache, Hearing Loss, Hoarseness, Nose Bleed, Oral Ulcers, Ringing in the Ears, Seasonal Allergies, Sinus Pain, Sore Throat and Yellow Eyes. Respiratory Not Present- Bloody sputum, Chronic Cough, Difficulty Breathing, Snoring and Wheezing. Breast Not Present- Breast Mass, Breast Pain, Nipple Discharge and Skin Changes. Cardiovascular Present- Palpitations and Rapid Heart Rate. Not Present- Chest Pain, Difficulty Breathing Lying Down, Leg Cramps,  Shortness of Breath and Swelling of Extremities. Gastrointestinal Present- Indigestion. Not Present- Abdominal Pain, Bloating, Bloody Stool, Change in Bowel Habits, Chronic diarrhea, Constipation, Difficulty Swallowing, Excessive gas, Gets full quickly at meals, Hemorrhoids, Nausea, Rectal Pain and Vomiting. Female Genitourinary Not Present- Frequency, Nocturia, Painful Urination, Pelvic Pain and Urgency. Musculoskeletal Not Present- Back Pain, Joint Pain, Joint Stiffness, Muscle Pain, Muscle Weakness and Swelling of Extremities. Neurological Not Present- Decreased Memory, Fainting, Headaches, Numbness, Seizures, Tingling, Tremor, Trouble walking and Weakness. Psychiatric Present- Anxiety. Not Present- Bipolar, Change in Sleep Pattern, Depression, Fearful and Frequent crying. Endocrine Present- Hot flashes. Not Present- Cold Intolerance, Excessive Hunger, Hair Changes, Heat Intolerance and New Diabetes. Hematology Not Present- Easy Bruising, Excessive bleeding, Gland problems, HIV and Persistent Infections.   Physical Exam  General Mental Status-Alert. Orientation-Oriented X3. Chest and Lung Exam Chest and lung exam reveals -on auscultation, normal breath sounds, no adventitious sounds and normal vocal resonance. Breast Nipples-No Discharge. Breast Lump-No Palpable Breast Mass. Healed left breast and axilllary scars Cardiovascular Cardiovascular examination reveals -normal heart sounds, regular rate and rhythm with no murmurs. Lymphatic Head & Neck General Head & Neck Lymphatics: Bilateral - Description - Normal. Axillary General Axillary Region: Bilateral - Description - Normal. Note: no [place] adenopathy   A/p stage I breast cancer, positive inferior margin Excise margin prior to radiotherapy

## 2015-07-01 NOTE — Anesthesia Preprocedure Evaluation (Signed)
Anesthesia Evaluation  Patient identified by MRN, date of birth, ID band Patient awake    Reviewed: Allergy & Precautions, NPO status , Patient's Chart, lab work & pertinent test results  Airway Mallampati: I  TM Distance: >3 FB Neck ROM: Full    Dental   Pulmonary former smoker,    Pulmonary exam normal        Cardiovascular Normal cardiovascular exam     Neuro/Psych Anxiety    GI/Hepatic GERD  Controlled and Medicated,  Endo/Other    Renal/GU      Musculoskeletal   Abdominal   Peds  Hematology   Anesthesia Other Findings   Reproductive/Obstetrics                             Anesthesia Physical Anesthesia Plan  ASA: II  Anesthesia Plan: General   Post-op Pain Management:    Induction: Intravenous  Airway Management Planned: LMA  Additional Equipment:   Intra-op Plan:   Post-operative Plan: Extubation in OR  Informed Consent: I have reviewed the patients History and Physical, chart, labs and discussed the procedure including the risks, benefits and alternatives for the proposed anesthesia with the patient or authorized representative who has indicated his/her understanding and acceptance.     Plan Discussed with: CRNA and Surgeon  Anesthesia Plan Comments:         Anesthesia Quick Evaluation

## 2015-07-01 NOTE — Transfer of Care (Signed)
Immediate Anesthesia Transfer of Care Note  Patient: Lindsey Barrett  Procedure(s) Performed: Procedure(s): RE-EXCISION OF LEFT BREAST INFERIOR MARGIN (Left)  Patient Location: PACU  Anesthesia Type:General  Level of Consciousness: awake, alert , oriented and patient cooperative  Airway & Oxygen Therapy: Patient Spontanous Breathing and Patient connected to face mask oxygen  Post-op Assessment: Report given to RN and Post -op Vital signs reviewed and stable  Post vital signs: Reviewed and stable  Last Vitals:  Filed Vitals:   07/01/15 1156  BP: 134/67  Pulse: 79  Temp: 36.7 C  Resp: 18    Complications: No apparent anesthesia complications

## 2015-07-01 NOTE — Anesthesia Procedure Notes (Signed)
Procedure Name: LMA Insertion Date/Time: 07/01/2015 12:49 PM Performed by: Iviana Blasingame D Pre-anesthesia Checklist: Patient identified, Emergency Drugs available, Suction available and Patient being monitored Patient Re-evaluated:Patient Re-evaluated prior to inductionOxygen Delivery Method: Circle System Utilized Preoxygenation: Pre-oxygenation with 100% oxygen Intubation Type: IV induction Ventilation: Mask ventilation without difficulty LMA: LMA inserted LMA Size: 4.0 Number of attempts: 1 Airway Equipment and Method: Bite block Placement Confirmation: positive ETCO2 Tube secured with: Tape Dental Injury: Teeth and Oropharynx as per pre-operative assessment

## 2015-07-01 NOTE — Anesthesia Postprocedure Evaluation (Signed)
Anesthesia Post Note  Patient: Lindsey Barrett  Procedure(s) Performed: Procedure(s) (LRB): RE-EXCISION OF LEFT BREAST INFERIOR MARGIN (Left)  Patient location during evaluation: PACU Anesthesia Type: General Level of consciousness: awake and alert Pain management: pain level controlled Vital Signs Assessment: post-procedure vital signs reviewed and stable Respiratory status: non-rebreather facemask Cardiovascular status: blood pressure returned to baseline and stable Postop Assessment: no signs of nausea or vomiting Anesthetic complications: no    Last Vitals:  Filed Vitals:   07/01/15 1400 07/01/15 1430  BP: 131/70 144/73  Pulse: 81 83  Temp:  36.4 C  Resp: 16 16    Last Pain:  Filed Vitals:   07/01/15 1431  PainSc: 3                  Niylah Hassan DAVID

## 2015-07-01 NOTE — Op Note (Addendum)
Preoperative diagnosis: clinical stage I left breast cancer with positive inferior margin after lumpectomy Postoperative diagnosis: same as above Procedure: Left breast reexcision lumpectomy, inferior margin Surgeon Dr Serita Grammes Anes general EBL: minimal Comps none Specimen:  Left breast inferior margin marked with paint Sponge count correct at completion dispo to recovery stable  Indications: This is a 70 yo who has clinical stage I left breast cancer. Her oncotype is 18.  She underwent lumpectomy/sn biopsy and has positive inferior margin. We discussed margin excision.     Procedure: After informed consent was obtained the patient was taken to the OR.She was given ancef. SCDs were in place. She was prepped and draped in the standard sterile surgical fashion. A timeout was performed. I then reentered the perioareolar incision of the left breast. I removed all the sutures I had used to close the breast tissue. I identified the inferior margin and the clip I had previously placed. I used cautery to excise this margin and then painted it.  I closed the deep tissue with 2-0 vicryl. The superficial tissue was closedwith 3-0 vicryl and the skin was then closed with 5-0 monocryl Glue and steristrips were applied. he was extubated and transferred to recovery stable

## 2015-07-04 ENCOUNTER — Encounter (HOSPITAL_BASED_OUTPATIENT_CLINIC_OR_DEPARTMENT_OTHER): Payer: Self-pay | Admitting: General Surgery

## 2015-07-05 ENCOUNTER — Telehealth: Payer: Self-pay | Admitting: Hematology and Oncology

## 2015-07-05 NOTE — Telephone Encounter (Signed)
S/w pt, gave appt 3/3 @ 11.45.

## 2015-07-05 NOTE — Discharge Instructions (Signed)
[facility] °Office Phone Number [phone] °POST OP INSTRUCTIONS ° °Always review your discharge instruction sheet given to you by the facility where your surgery was performed. ° °IF YOU HAVE DISABILITY OR FAMILY LEAVE FORMS, YOU MUST BRING THEM TO THE OFFICE FOR PROCESSING.  DO NOT GIVE THEM TO YOUR DOCTOR. ° °1. A prescription for pain medication may be given to you upon discharge.  Take your pain medication as prescribed, if needed.  If narcotic pain medicine is not needed, then you may take acetaminophen (Tylenol), naprosyn (Alleve) or ibuprofen (Advil) as needed. °2. Take your usually prescribed medications unless otherwise directed °3. If you need a refill on your pain medication, please contact your pharmacy.  They will contact our office to request authorization.  Prescriptions will not be filled after 5pm or on week-ends. °4. You should eat very light the first 24 hours after surgery, such as soup, crackers, pudding, etc.  Resume your normal diet the day after surgery. °5. Most patients will experience some swelling and bruising in the breast.  Ice packs and a good support bra will help.  Wear the breast binder provided or a sports bra for 72 hours day and night.  After that wear a sports bra during the day until you return to the office. Swelling and bruising can take several days to resolve.  °6. It is common to experience some constipation if taking pain medication after surgery.  Increasing fluid intake and taking a stool softener will usually help or prevent this problem from occurring.  A mild laxative (Milk of Magnesia or Miralax) should be taken according to package directions if there are no bowel movements after 48 hours. °7. Unless discharge instructions indicate otherwise, you may remove your bandages 48 hours after surgery and you may shower at that time.  You may have steri-strips (small skin tapes) in place directly over the incision.  These strips should be left on the  skin for 7-10 days and will come off on their own.  If your surgeon used skin glue on the incision, you may shower in 24 hours.  The glue will flake off over the next 2-3 weeks.  Any sutures or staples will be removed at the office during your follow-up visit. °8. ACTIVITIES:  You may resume regular daily activities (gradually increasing) beginning the next day.  Wearing a good support bra or sports bra minimizes pain and swelling.  You may have sexual intercourse when it is comfortable. °a. You may drive when you no longer are taking prescription pain medication, you can comfortably wear a seatbelt, and you can safely maneuver your car and apply brakes. °b. RETURN TO WORK:  ______________________________________________________________________________________ °9. You should see your doctor in the office for a follow-up appointment approximately two weeks after your surgery.  Your doctor’s nurse will typically make your follow-up appointment when she calls you with your pathology report.  Expect your pathology report 3-4 business days after your surgery.  You may call to check if you do not hear from us after three days. °10. OTHER INSTRUCTIONS: _______________________________________________________________________________________________ _____________________________________________________________________________________________________________________________________ °_____________________________________________________________________________________________________________________________________ °_____________________________________________________________________________________________________________________________________ ° °WHEN TO CALL DR Berley Gambrell: °1. Fever over 101.0 °2. Nausea and/or vomiting. °3. Extreme swelling or bruising. °4. Continued bleeding from incision. °5. Increased pain, redness, or drainage from the incision. ° °The clinic staff is available to answer your questions during regular  business hours.  Please don’t hesitate to call and ask to speak to one of the nurses for clinical concerns.  If you   have a medical emergency, go to the nearest emergency room or call 911.  A surgeon from [facility] is always on call at the hospital. ° °For further questions, please visit [email or web address] ° °

## 2015-07-07 ENCOUNTER — Encounter: Payer: Self-pay | Admitting: Gastroenterology

## 2015-07-07 NOTE — Assessment & Plan Note (Signed)
Left lumpectomy 05/31/2015: Invasive ductal carcinoma grade 2, 1.5 cm, 0/3 lymph nodes negative, broadly involves inferior margin, ER 100%, PR 95%, HER-2 negative, Ki-67 5%, T1 cN0 stage IA, Oncotype DX score 18, 12% ROR Adjuvant radiation therapy with Dr. Sondra Come to start soon. Followed by adjuvant antiestrogen therapy

## 2015-07-08 ENCOUNTER — Telehealth: Payer: Self-pay | Admitting: Hematology and Oncology

## 2015-07-08 ENCOUNTER — Ambulatory Visit (HOSPITAL_BASED_OUTPATIENT_CLINIC_OR_DEPARTMENT_OTHER): Payer: Medicare Other | Admitting: Hematology and Oncology

## 2015-07-08 ENCOUNTER — Encounter: Payer: Self-pay | Admitting: Hematology and Oncology

## 2015-07-08 VITALS — BP 127/63 | HR 81 | Temp 97.8°F | Resp 18 | Ht 64.0 in | Wt 171.8 lb

## 2015-07-08 DIAGNOSIS — C50212 Malignant neoplasm of upper-inner quadrant of left female breast: Secondary | ICD-10-CM

## 2015-07-08 DIAGNOSIS — Z170 Estrogen receptor positive status [ER+]: Secondary | ICD-10-CM | POA: Diagnosis not present

## 2015-07-08 MED ORDER — ANASTROZOLE 1 MG PO TABS: 1.0000 mg | ORAL_TABLET | Freq: Every day | ORAL | Status: DC

## 2015-07-08 NOTE — Progress Notes (Signed)
Location of Breast Cancer: clinical stage I left breast cancer    Histology per Pathology Report:   07/01/15 Diagnosis Breast, excision, left inferior margin - FIBROCYSTIC CHANGES WITH USUAL DUCTAL HYPERPLASIA, ADENOSIS AND ASSOCIATED CALCIFICATIONS. - NO ATYPIA OR TUMOR SEEN.  05/31/15 Diagnosis 1. 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 - TUMOR BROADLY INVOLVES INFERIOR MARGIN. - OTHER MARGINS ARE NEGATIVE. - SEE ONCOLOGY TEMPLATE. 2. Breast, excision, Left, additional medial margin - BENIGN BREAST PARENCHYMA WITH FIBROCYSTIC CHANGES AND ADENOSIS. - NO ATYPIA OR TUMOR SEEN. - SEE COMMENT. 3. Lymph node, sentinel, biopsy, Left axillary - ONE BENIGN LYMPH NODE WITH NO TUMOR SEEN (0/1). 4. Lymph node, sentinel, biopsy, Left axillary - ONE BENIGN LYMPH NODE WITH NO TUMOR SEEN (0/1). 5. Lymph node, sentinel, biopsy, Left axillary - ONE BENIGN LYMPH NODE WITH NO TUMOR SEEN (0/1).  05/18/15 Diagnosis Breast, left, needle core biopsy, mass - INVASIVE MAMMARY CARCINOMA - SEE COMMENT.  Receptor Status: ER(100%), PR (95%), Her2-neu (negative)  Did patient present with symptoms (if so, please note symptoms) or was this found on screening mammography?: screening mammogram  Past/Anticipated interventions by surgeon, if any: 05/31/15 - Procedure: LEFT BREAST RADIOACTIVE SEED GUIDED LUMPECTOMY WITH LEFT AXILLARY SENTINEL LYMPH NODE BIOPSY;  Surgeon: [name], MD;  Location: [facility];  Service: General;  Laterality: Left; 07/01/15 - Procedure: RE-EXCISION OF LEFT BREAST INFERIOR MARGIN;  Surgeon: [name], MD;  Location: [facility];  Service: General;  Laterality: Left  Past/Anticipated interventions by medical oncology, if any: Per Dr. Lindi Adie - "Oncotype DX testing to determine if chemotherapy would be of any benefit followed by adjuvant radiation therapy followed by adjuvant  antiestrogen therapy. Her oncotype testing came back at an 81, and she has been told she will not need chemotherapy.  Lymphedema issues, if any:  No  Pain issues, if any: She reports some soreness occasionally with arm movement.   OB/GYN history: She menarched at early age of 76 and half and went to menopause at age 62. She had no pregnancies and has not received birth control pills. She was exposed to fertility medications or hormone replacement therapy for 5 years. She has no family history of Breast/GYN/GI cancer.  SAFETY ISSUES:  Prior radiation? No  Pacemaker/ICD? No  Possible current pregnancy? No  Is the patient on methotrexate? No  Current Complaints / other details:  She has healed well, she still has steri strips and "glue" over her incision per her report.   BP 114/64 mmHg  Pulse 71  Temp(Src) 98.1 F (36.7 C)  Ht _0  (1.626 m)  Wt 173 lb 6.4 oz (78.654 kg)  BMI 29.75 kg/m2

## 2015-07-08 NOTE — Progress Notes (Signed)
Patient Care Team: Hendricks Limes, MD as PCP - General (Internal Medicine) [name], MD as Consulting Physician (Gynecology) Sylvan Cheese, NP as Nurse Practitioner (Hematology and Oncology)  DIAGNOSIS: Breast cancer of upper-inner quadrant of left female breast [facility])   Staging form: Breast, AJCC 7th Edition     Clinical stage from 05/25/2015: Stage IA (T1c, N0, M0) - Unsigned       Staging comments: Staged at breast conference on 1.18.17  SUMMARY OF ONCOLOGIC HISTORY:   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[place])   05/18/2015 Initial Diagnosis Left breast biopsy: Invasive ductal carcinoma low to intermediate grade, ER 100%, PR 95%, HER-2 negative ratio 1.02, Ki-67 5%, left breast asymmetry 1.3 cm, T1c N0 stage IA   05/31/2015 Surgery Left lumpectomy: Invasive ductal carcinoma grade 2, 1.5 cm, 0/3 lymph nodes negative, broadly involves inferior margin, ER 100%, PR 95%, HER-2 negative duration 112, Ki-67 5%, T1 cN0 stage IA, Oncotype DX score 18, 12% ROR   07/01/2015 Surgery Reexcision of the left inferior margin: Fibrocystic changes with UDH    CHIEF COMPLIANT: Follow-up to discuss a treatment plan  INTERVAL HISTORY: Lindsey Barrett is a 70 year old above-mentioned history of left breast cancer underwent lumpectomy and reexcision surgery is here today to discuss adjuvant treatment plan. She reports a seizure related drowsiness and fatigue. It appears to be getting better over time. She does have itching and the breast at site of surgery. She is scheduled to see Dr. Randa Ngo and will start radiation very soon.  REVIEW OF SYSTEMS:   Constitutional: Denies fevers, chills or abnormal weight loss Eyes: Denies blurriness of vision Ears, nose, mouth, throat, and face: Denies mucositis or sore throat Respiratory: Denies cough, dyspnea or wheezes Cardiovascular: Denies palpitation, chest discomfort Gastrointestinal:  Denies nausea, heartburn or change  in bowel habits Skin: Denies abnormal skin rashes Lymphatics: Denies new lymphadenopathy or easy bruising Neurological:Denies numbness, tingling or new weaknesses Behavioral/Psych: Mood is stable, no new changes  Extremities: No lower extremity edema Breast: Recent reexcision surgery All other systems were reviewed with the patient and are negative.  I have reviewed the past medical history, past surgical history, social history and family history with the patient and they are unchanged from previous note.  ALLERGIES:  is allergic to septra ds and adhesive.  MEDICATIONS:  Current Outpatient Prescriptions  Medication Sig Dispense Refill  . anastrozole (ARIMIDEX) 1 MG tablet Take 1 tablet (1 mg total) by mouth daily. 90 tablet 3  . aspirin 81 MG tablet Take 81 mg by mouth as directed.     . bimatoprost (LUMIGAN) 0.01 % SOLN Place 1 drop into both eyes at bedtime.    . brinzolamide (AZOPT) 1 % ophthalmic suspension 1 drop 2 (two) times daily.    . calcium carbonate (TUMS - DOSED IN MG ELEMENTAL CALCIUM) 500 MG chewable tablet Chew 1 tablet by mouth as needed for indigestion or heartburn.    . Ergocalciferol (VITAMIN D2) 2000 UNITS TABS Take 1 tablet by mouth daily.    [name] HYDROcodone-acetaminophen (NORCO) 10-325 MG tablet Take 1 tablet by mouth every 6 (six) hours as needed. 20 tablet 0  . ibuprofen (ADVIL,MOTRIN) 200 MG tablet Take 200 mg by mouth every 6 (six) hours as needed.    . ranitidine (ZANTAC) 150 MG tablet Take 1 tablet (150 mg total) by mouth 2 (two) times daily. 60 tablet 0  . rosuvastatin (CRESTOR) 40 MG tablet Take 1 tablet (40 mg total) by  mouth daily. 90 tablet 3  . [DISCONTINUED] metoprolol tartrate (LOPRESSOR) 25 MG tablet Take 1 tablet (25 mg total) by mouth as directed. 1 pill every 8 hours as needed for tachycardia or palpitations. 30 tablet 2   No current facility-administered medications for this visit.    PHYSICAL EXAMINATION: ECOG PERFORMANCE STATUS: 1 -  Symptomatic but completely ambulatory  Filed Vitals:   07/08/15 1154  BP: 127/63  Pulse: 81  Temp: 97.8 F (36.6 C)  Resp: 18   Filed Weights   07/08/15 1154  Weight: 171 lb 12.8 oz (77.928 kg)    GENERAL:alert, no distress and comfortable SKIN: skin color, texture, turgor are normal, no rashes or significant lesions EYES: normal, Conjunctiva are pink and non-injected, sclera clear OROPHARYNX:no exudate, no erythema and lips, buccal mucosa, and tongue normal  NECK: supple, thyroid normal size, non-tender, without nodularity LYMPH:  no palpable lymphadenopathy in the cervical, axillary or inguinal LUNGS: clear to auscultation and percussion with normal breathing effort HEART: regular rate & rhythm and no murmurs and no lower extremity edema ABDOMEN:abdomen soft, non-tender and normal bowel sounds MUSCULOSKELETAL:no cyanosis of digits and no clubbing  NEURO: alert & oriented x 3 with fluent speech, no focal motor/sensory deficits EXTREMITIES: No lower extremity edema BREAST No nodules in either right or left breasts. No palpable axillary supraclavicular or infraclavicular adenopathy no breast tenderness or nipple discharge. (exam performed in the presence of a chaperone)  LABORATORY DATA:  I have reviewed the data as listed   Chemistry      Component Value Date/Time   NA 142 05/25/2015 1229   NA 142 12/25/2014 0420   K 4.4 05/25/2015 1229   K 3.8 12/25/2014 0420   CL 106 12/25/2014 0420   CO2 30* 05/25/2015 1229   CO2 27 12/25/2014 0420   BUN 12.9 05/25/2015 1229   BUN 16 12/25/2014 0420   CREATININE 0.8 05/25/2015 1229   CREATININE 0.57 12/25/2014 0420      Component Value Date/Time   CALCIUM 9.3 05/25/2015 1229   CALCIUM 8.7* 12/25/2014 0420   ALKPHOS 59 05/25/2015 1229   ALKPHOS 60 06/18/2014 1053   AST 22 05/25/2015 1229   AST 24 06/18/2014 1053   ALT 20 05/25/2015 1229   ALT 24 06/18/2014 1053   BILITOT 1.63* 05/25/2015 1229   BILITOT 1.1 06/18/2014 1053         Lab Results  Component Value Date   WBC 6.9 05/25/2015   HGB 13.4 05/25/2015   HCT 40.8 05/25/2015   MCV 87.0 05/25/2015   PLT 219 05/25/2015   NEUTROABS 4.7 05/25/2015     ASSESSMENT & PLAN:  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[place]) Left lumpectomy 05/31/2015: Invasive ductal carcinoma grade 2, 1.5 cm, 0/3 lymph nodes negative, broadly involves inferior margin, ER 100%, PR 95%, HER-2 negative, Ki-67 5%, T1 cN0 stage IA, Oncotype DX score 18, 12% ROR, Reexcision of margins negative  Adjuvant radiation therapy with Dr. Sondra Come to start soon. Followed by adjuvant antiestrogen therapy with anastrozole. Hopefully she will start this in June 2017. I will see her back in July to assess toxicities of treatment.  No orders of the defined types were placed in this encounter.   The patient has a good understanding of the overall plan. she agrees with it. she will call with any problems that may develop before the next visit here.   [name], MD 07/08/2015

## 2015-07-08 NOTE — Telephone Encounter (Signed)
Left message to inform patient of July f/u appt date/time per 3/3 pof

## 2015-07-13 ENCOUNTER — Ambulatory Visit
Admission: RE | Admit: 2015-07-13 | Discharge: 2015-07-13 | Disposition: A | Discharge status: Home / Self Care | Payer: Medicare Other | Source: Ambulatory Visit | Attending: Radiation Oncology | Admitting: Radiation Oncology

## 2015-07-13 ENCOUNTER — Encounter: Payer: Self-pay | Admitting: Radiation Oncology

## 2015-07-13 VITALS — BP 114/64 | HR 71 | Temp 98.1°F | Ht 64.0 in | Wt 173.4 lb

## 2015-07-13 DIAGNOSIS — Z79899 Other long term (current) drug therapy: Secondary | ICD-10-CM | POA: Insufficient documentation

## 2015-07-13 DIAGNOSIS — Z7982 Long term (current) use of aspirin: Secondary | ICD-10-CM | POA: Diagnosis not present

## 2015-07-13 DIAGNOSIS — Z882 Allergy status to sulfonamides status: Secondary | ICD-10-CM | POA: Insufficient documentation

## 2015-07-13 DIAGNOSIS — C50212 Malignant neoplasm of upper-inner quadrant of left female breast: Secondary | ICD-10-CM | POA: Diagnosis not present

## 2015-07-13 DIAGNOSIS — Z170 Estrogen receptor positive status [ER+]: Secondary | ICD-10-CM | POA: Diagnosis not present

## 2015-07-13 DIAGNOSIS — Z510 Encounter for antineoplastic radiation therapy: Secondary | ICD-10-CM | POA: Insufficient documentation

## 2015-07-13 DIAGNOSIS — Z79811 Long term (current) use of aromatase inhibitors: Secondary | ICD-10-CM | POA: Diagnosis not present

## 2015-07-13 NOTE — Progress Notes (Signed)
Radiation Oncology         [phone] ________________________________  Name: Lindsey Barrett MRN: [id number]  Date: 07/13/2015  DOB: [birth year]  Re-Evaluation Visit Note  CC: Unice Cobble, MD  [name], MD    ICD-9-CM ICD-10-CM   1. 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[place]) 174.2 C50.212     Diagnosis: T1cN0 Stage IA invasive ductal carcinoma of the left breast   Narrative:  The patient returns today for a re-evaluation. Her initial consultation with myself was on 05/25/2015. Since then, she underwent a left breast lumpectomy on 05/31/15. This revealed grade 2 invasive ductal carcinoma measuring 1.5 cm (ER/PR positive, HER2 negative). The tumor broadly involved the inferior margin.  Additionally, three left axillary lymph nodes were biopsied and were benign. Oncotype DX score was 18 and chemotherapy was not recommended per Dr. Lindi Adie. The patient underwent re-excision of the left inferior margin on 07/01/15 and this showed fibrocystic changes with usual ductal hyperplasia adenosis and associated calcifications, no atypia or tumor were seen. The patient presents today to discuss radiotherapy for the management of her disease.  The patient denies left arm swelling, but reported some numbness in the left axillary region after surgery. This numbness has since resolved.  ALLERGIES:  is allergic to septra ds and adhesive.  Meds: Current Outpatient Prescriptions  Medication Sig Dispense Refill  . aspirin 81 MG tablet Take 81 mg by mouth as directed.     . bimatoprost (LUMIGAN) 0.01 % SOLN Place 1 drop into both eyes at bedtime.    . brinzolamide (AZOPT) 1 % ophthalmic suspension 1 drop 2 (two) times daily.    . calcium carbonate (TUMS - DOSED IN MG ELEMENTAL CALCIUM) 500 MG chewable tablet Chew 1 tablet by mouth as needed for indigestion or heartburn.    . Ergocalciferol (VITAMIN D2) 2000 UNITS TABS Take 1 tablet by mouth daily.    [name] ibuprofen  (ADVIL,MOTRIN) 200 MG tablet Take 200 mg by mouth every 6 (six) hours as needed.    . ranitidine (ZANTAC) 150 MG tablet Take 1 tablet (150 mg total) by mouth 2 (two) times daily. 60 tablet 0  . rosuvastatin (CRESTOR) 40 MG tablet Take 1 tablet (40 mg total) by mouth daily. 90 tablet 3  . anastrozole (ARIMIDEX) 1 MG tablet Take 1 tablet (1 mg total) by mouth daily. (Patient not taking: Reported on 07/13/2015) 90 tablet 3  . HYDROcodone-acetaminophen (NORCO) 10-325 MG tablet Take 1 tablet by mouth every 6 (six) hours as needed. (Patient not taking: Reported on 07/13/2015) 20 tablet 0  . [DISCONTINUED] metoprolol tartrate (LOPRESSOR) 25 MG tablet Take 1 tablet (25 mg total) by mouth as directed. 1 pill every 8 hours as needed for tachycardia or palpitations. 30 tablet 2   No current facility-administered medications for this encounter.    Physical Findings: The patient is in no acute distress. Patient is alert and oriented.  height is _0  (1.626 m) and weight is 173 lb 6.4 oz (78.654 kg). Her temperature is 98.1 F (36.7 C). Her blood pressure is 114/64 and her pulse is 71.   Lungs are clear to auscultation bilaterally. Heart has regular rate and rhythm. No palpable cervical, supraclavicular, or axillary adenopathy. Breast Exam: Scar in the superior periareolar area. Some bruising in the left breast with no sign of infection.  Lab Findings: Lab Results  Component Value Date   WBC 6.9 05/25/2015   HGB 13.4 05/25/2015   HCT 40.8 05/25/2015  MCV 87.0 05/25/2015   PLT 219 05/25/2015    Radiographic Findings: No results found.  Impression: T1cN0 Stage IA invasive ductal carcinoma of the left breast. The patient is a good candidate for breast conservation radiotherapy.  Plan: I spoke to the patient today regarding her diagnosis and options for treatment. We discussed the equivalence in terms of survival and local failure between mastectomy and breast conservation. We discussed the role of  radiation in decreasing local failures in patients who undergo lumpectomy. We discussed the process of CT simulation and the placement tattoos. We discussed 4-6 weeks of treatment as an outpatient. We discussed the possibility of asymptomatic lung damage. We discussed the low likelihood of secondary malignancies. We discussed the possible side effects including but not limited to skin redness, fatigue, permanent skin darkening, and breast swelling.  We discussed the use of cardiac sparing with deep inspiration breath hold if needed. The patient signed a consent form and this was placed in her medical chart.  CT simulation has been scheduled on 08/02/15 at [facility]. ____________________________________ -----------------------------------  Blair Promise, PhD, MD  This document serves as a record of services personally performed by Gery Pray, MD. It was created on his behalf by Darcus Austin, a trained medical scribe. The creation of this record is based on the scribe's personal observations and the provider's statements to them. This document has been checked and approved by the attending provider.

## 2015-08-01 ENCOUNTER — Ambulatory Visit: Payer: Medicare Other | Attending: General Surgery | Admitting: Physical Therapy

## 2015-08-01 DIAGNOSIS — M25612 Stiffness of left shoulder, not elsewhere classified: Secondary | ICD-10-CM | POA: Insufficient documentation

## 2015-08-01 NOTE — Therapy (Addendum)
[place], [place], [address] Phone: [phone]   Fax:  [phone]  Physical Therapy Evaluation  Patient Details  Name: KEYAIRA CLAPHAM MRN: [id number] Date of Birth: 22-Dec-1945 Referring Provider: Dr. [name]  Encounter Date: 08/01/2015      PT End of Session - 08/01/15 2247    Visit Number 1   Number of Visits 1   PT Start Time 8101   PT Stop Time 1606   PT Time Calculation (min) 41 min   Activity Tolerance Patient tolerated treatment well   Behavior During Therapy [facility] for tasks assessed/performed      Past Medical History  Diagnosis Date  . Fasting hyperglycemia   . DDD (degenerative disc disease)   . Gilbert syndrome   . Breast cyst     x3  . Hyperlipidemia   . Anxiety   . GERD (gastroesophageal reflux disease)   . Diverticulosis   . HSV infection   . Atrophic vaginitis   . STD (sexually transmitted disease)     HSV  . Glaucoma   . 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[place]) 05/20/2015  . Breast cancer ([place])   . History of hiatal hernia     Past Surgical History  Procedure Laterality Date  . Appendectomy    . Breast cyst aspiration      x2 right ,x1 left  . Wisdom tooth extraction    . Dilation and curettage of uterus    . Upper gi endoscopy  10/2010    Dr [name]  . Pelvic laparoscopy    . Colonoscopy  2007    neg  . Radioactive seed guided mastectomy with axillary sentinel lymph node biopsy Left 05/31/2015    Procedure: LEFT BREAST RADIOACTIVE SEED GUIDED LUMPECTOMY WITH LEFT AXILLARY SENTINEL LYMPH NODE BIOPSY;  Surgeon: [name], MD;  Location: [facility];  Service: General;  Laterality: Left;  . Re-excision of breast lumpectomy Left 07/01/2015    Procedure: RE-EXCISION OF LEFT BREAST INFERIOR MARGIN;  Surgeon: [name], MD;  Location: [facility];  Service: General;  Laterality: Left;     There were no vitals filed for this visit.  Visit Diagnosis:  Stiffness of left shoulder, not elsewhere classified - Plan: PT plan of care cert/re-cert      Subjective Assessment - 08/01/15 1532    Subjective Sees rad onc tomorrow and wants to see if she has the mobility to get into position for that.   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 It is located in the upper outer quadrant and measures 1.3 cm.  It is ER/PR positive, HER2 negative with a Ki67 of 5%.  Had lumpectomy 05/31/15 but one margin was not clean; second surgery 07/01/15 to get clean margins.  Two-three lymph nodes removed, negative.  Oncotype testing was 18, so no chemo.  Hyperlipidemia.  Has had a good bone density test last June.     Patient Stated Goals find out if she can do rad onc   Currently in Pain? No/denies            [facility] PT Assessment - 08/01/15 0001    Assessment   Medical Diagnosis Left breast cancer   Referring Provider Dr. [name]   Onset Date/Surgical Date 07/01/15  05/31/15 was first surgery   Hand Dominance Right   Precautions   Precaution Comments cancer precautjions  Restrictions   Weight Bearing Restrictions No   Balance Screen   Has the patient fallen in the past 6 months No   Has the patient had a decrease in activity level because of a fear of falling?  No   Is the patient reluctant to leave their home because of a fear of falling?  No   Home Ecologist residence   Living Arrangements Alone   Prior Function   Level of Independence Independent   Vocation Full time employment   Furniture conservator/restorer at [employer]  mostly desk work; lifts some boxes of files   Leisure wants to exercise, but isn't doing her goal of 30 minutes walking, but does 15 minutes in front of the TV   Cognition   Overall Cognitive Status Within Functional Limits for tasks  assessed   Observation/Other Assessments   Skin Integrity incisions healed at breast just superior to nipple and at axilla   Quick DASH  0   Posture/Postural Control   Posture/Postural Control Postural limitations   Postural Limitations Rounded Shoulders;Forward head   AROM   Left Shoulder Extension 60 Degrees   Left Shoulder Flexion 153 Degrees   Left Shoulder ABduction 170 Degrees   Left Shoulder Internal Rotation --  [facility]   Left Shoulder External Rotation 80 Degrees   Ambulation/Gait   Ambulation/Gait Yes   Ambulation/Gait Assistance 7: Independent           LYMPHEDEMA/ONCOLOGY QUESTIONNAIRE - 08/01/15 1539    Type   Cancer Type left breast cancer   Surgeries   Lumpectomy Date 07/01/15  first was 05/31/15   Treatment   Active Radiation Treatment Yes  is to have simulation 08/02/15   Right Upper Extremity Lymphedema   10 cm Proximal to Olecranon Process 28.2 cm   Olecranon Process 25.6 cm   10 cm Proximal to Ulnar Styloid Process 22.2 cm   Just Proximal to Ulnar Styloid Process 15.8 cm   Across Hand at [employer] 19.3 cm   At [place] of 2nd Digit 6.2 cm   Left Upper Extremity Lymphedema   10 cm Proximal to Olecranon Process 26.8 cm   Olecranon Process 24.3 cm   10 cm Proximal to Ulnar Styloid Process 21.4 cm   Just Proximal to Ulnar Styloid Process 16 cm   Across Hand at [employer] 19 cm   At [place] of 2nd Digit 6.1 cm           Quick Dash - 08/01/15 0001    Open a tight or new jar No difficulty   Do heavy household chores (wash walls, wash floors) No difficulty   Carry a shopping bag or briefcase No difficulty   Wash your back No difficulty   Use a knife to cut food No difficulty   Recreational activities in which you take some force or impact through your arm, shoulder, or hand (golf, hammering, tennis) No difficulty   During the past week, to what extent has your arm, shoulder or hand problem interfered with your normal social activities with family,  friends, neighbors, or groups? Not at all   During the past week, to what extent has your arm, shoulder or hand problem limited your work or other regular daily activities Not at all   Arm, shoulder, or hand pain. None   Tingling (pins and needles) in your arm, shoulder, or hand None   Difficulty Sleeping No difficulty   DASH Score 0 %  PT Education - 08/12/2015 04-Jul-2242    Education provided Yes   Education Details about ABC class; [facility]; [facility]   Person(s) Educated Patient   Methods Explanation;Handout   Comprehension Verbalized understanding              Breast Clinic Goals - 05/25/15 1713    Patient will be able to verbalize understanding of pertinent lymphedema risk reduction practices relevant to her diagnosis specifically related to skin care.   Time 1   Period Days   Status Achieved   Patient will be able to return demonstrate and/or verbalize understanding of the post-op home exercise program related to regaining shoulder range of motion.   Time 1   Period Days   Status Achieved   Patient will be able to verbalize understanding of the importance of attending the postoperative After Breast Cancer Class for further lymphedema risk reduction education and therapeutic exercise.   Time 1   Period Days   Status Achieved          Long Term Clinic Goals - August 12, 2015 2300    CC Long Term Goal  #1   Title Patient will be aware of the ABC class that she may attend for free.   Time 4   Period Weeks   Status Achieved   CC Long Term Goal  #2   Title Patient will be aware of other resources, including [facility], for her to recover an active lifestyle.   Status Achieved            Plan - 08-12-2015 July 04, 2245    Clinical Impression Statement Patient is doing well one month s/p left lumpectomy revision (2 months post original surgery).  Her left shoulder AROM is back pretty close to prmorbid levels.  She  does admit to needing to exercise and was given information about that as well as about [facility], which seems like it would be a good fit for her.  She should be able to get into position for radiation simulation tomorrow.   She does not need follow-up therapy.       Pt will benefit from skilled therapeutic intervention in order to improve on the following deficits Decreased strength;Pain;Decreased knowledge of precautions;Impaired UE functional use;Decreased range of motion   Rehab Potential Excellent   Clinical Impairments Affecting Rehab Potential none   PT Frequency One time visit   PT Treatment/Interventions ADLs/Self Care Home Management;Patient/family education   PT Next Visit Plan None at this time; patient may attend ABC class and may consider [facility].   Consulted and Agree with Plan of Care Patient          G-Codes - 08-12-2015 07/04/2301    Functional Assessment Tool Used quick DASH   Functional Limitation Self care   Self Care Current Status [phone]) 0 percent impaired, limited or restricted   Self Care Goal Status ([id number]) 0 percent impaired, limited or restricted   Self Care Discharge Status [id number]) 0 percent impaired, limited or restricted       Problem List Patient Active Problem List   Diagnosis Date Noted  . 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[place]) 05/20/2015  . Open-angle glaucoma 06/17/2014  . Unspecified adverse effect of unspecified drug, medicinal and biological substance 10/18/2011  . Vitamin D deficiency 10/18/2011  . STD (sexually transmitted disease)   . HSV infection   . Atrophic vaginitis   . VARICOSE VEINS, LOWER  EXTREMITIES 09/28/2009  . HEADACHE 09/28/2009  . GERD 07/14/2009  . NONSPECIFIC ABNORMAL ELECTROCARDIOGRAM 07/14/2009  . Hyperglycemia 01/01/2008  . Hyperlipidemia 12/26/2006  . GILBERT'S SYNDROME 08/09/2006  . DEGENERATIVE DISC DISEASE 08/09/2006    SALISBURY,DONNA 08/01/2015, 11:11 PM  [place], [place], [address] Phone: [phone]   Fax:  [phone]  Name: SHANAUTICA FORKER MRN: [id number] Date of Birth: 06/18/1945   Serafina Royals, PT 08/01/2015 11:11 PM  PHYSICAL THERAPY DISCHARGE SUMMARY  Visits from Start of Care: 1  Current functional level related to goals / functional outcomes: Goals met as noted above.   Remaining deficits: None requiring PT.  She would benefit from exercise program follow-up.   Education / Equipment: About ABC class, about [employer] at [employer]. Plan: Patient agrees to discharge.  Patient goals were met. Patient is being discharged due to meeting the stated rehab goals.  ?????    Serafina Royals, PT 08/02/2015 2:27 PM

## 2015-08-02 ENCOUNTER — Ambulatory Visit
Admission: RE | Admit: 2015-08-02 | Discharge: 2015-08-02 | Disposition: A | Discharge status: Home / Self Care | Payer: Medicare Other | Source: Ambulatory Visit | Attending: Radiation Oncology | Admitting: Radiation Oncology

## 2015-08-02 DIAGNOSIS — Z170 Estrogen receptor positive status [ER+]: Secondary | ICD-10-CM | POA: Diagnosis not present

## 2015-08-02 DIAGNOSIS — C50212 Malignant neoplasm of upper-inner quadrant of left female breast: Secondary | ICD-10-CM

## 2015-08-02 DIAGNOSIS — Z79899 Other long term (current) drug therapy: Secondary | ICD-10-CM | POA: Diagnosis not present

## 2015-08-02 DIAGNOSIS — Z7982 Long term (current) use of aspirin: Secondary | ICD-10-CM | POA: Diagnosis not present

## 2015-08-02 DIAGNOSIS — Z79811 Long term (current) use of aromatase inhibitors: Secondary | ICD-10-CM | POA: Diagnosis not present

## 2015-08-02 DIAGNOSIS — Z510 Encounter for antineoplastic radiation therapy: Secondary | ICD-10-CM | POA: Diagnosis not present

## 2015-08-03 ENCOUNTER — Encounter: Payer: Self-pay | Admitting: Oncology

## 2015-08-03 DIAGNOSIS — H524 Presbyopia: Secondary | ICD-10-CM | POA: Diagnosis not present

## 2015-08-03 DIAGNOSIS — H2513 Age-related nuclear cataract, bilateral: Secondary | ICD-10-CM | POA: Diagnosis not present

## 2015-08-03 DIAGNOSIS — H04123 Dry eye syndrome of bilateral lacrimal glands: Secondary | ICD-10-CM | POA: Diagnosis not present

## 2015-08-03 DIAGNOSIS — H401132 Primary open-angle glaucoma, bilateral, moderate stage: Secondary | ICD-10-CM | POA: Diagnosis not present

## 2015-08-06 NOTE — Progress Notes (Signed)
  Radiation Oncology         [phone] ________________________________  Name: Lindsey Barrett MRN: [id number]  Date: 08/02/2015  DOB: Dec 07, 1945  SIMULATION AND TREATMENT PLANNING NOTE    ICD-9-CM ICD-10-CM   1. 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[place]) 174.2 C50.212     DIAGNOSIS: T1cN0 Stage IA invasive ductal carcinoma of the left breast  NARRATIVE:  The patient was brought to the [facility].  Identity was confirmed.  All relevant records and images related to the planned course of therapy were reviewed.  The patient freely provided informed written consent to proceed with treatment after reviewing the details related to the planned course of therapy. The consent form was witnessed and verified by the simulation staff.  Then, the patient was set-up in a stable reproducible  supine position for radiation therapy.  CT images were obtained.  Surface markings were placed.  The CT images were loaded into the planning software.  Then the target and avoidance structures were contoured.  Treatment planning then occurred.  The radiation prescription was entered and confirmed.  Then, I designed and supervised the construction of a total of 3 medically necessary complex treatment devices.  I have requested : 3D Simulation  I have requested a DVH of the following structures: heart, lungs, lumpectomy cavity.  I have ordered:dose calc.  PLAN:  The patient will receive 50.4 Gy in 28 fractions. In Light of clear reexcision margins and no residual tumor found in the reexcision specimen, a  boost will not be given to the lumpectomy cavity  ________________________________  -----------------------------------  Blair Promise, PhD, MD

## 2015-08-08 DIAGNOSIS — Z510 Encounter for antineoplastic radiation therapy: Secondary | ICD-10-CM | POA: Diagnosis not present

## 2015-08-08 DIAGNOSIS — Z79899 Other long term (current) drug therapy: Secondary | ICD-10-CM | POA: Diagnosis not present

## 2015-08-08 DIAGNOSIS — Z7982 Long term (current) use of aspirin: Secondary | ICD-10-CM | POA: Diagnosis not present

## 2015-08-08 DIAGNOSIS — Z79811 Long term (current) use of aromatase inhibitors: Secondary | ICD-10-CM | POA: Diagnosis not present

## 2015-08-08 DIAGNOSIS — C50212 Malignant neoplasm of upper-inner quadrant of left female breast: Secondary | ICD-10-CM | POA: Diagnosis not present

## 2015-08-08 DIAGNOSIS — Z170 Estrogen receptor positive status [ER+]: Secondary | ICD-10-CM | POA: Diagnosis not present

## 2015-08-08 NOTE — Progress Notes (Signed)
  Radiation Oncology         [phone] ________________________________  Name: Lindsey Barrett MRN: [id number]  Date: 08/02/2015  DOB: 1945-11-25  Optical Surface Tracking Plan:  Since intensity modulated radiotherapy (IMRT) and 3D conformal radiation treatment methods are predicated on accurate and precise positioning for treatment, intrafraction motion monitoring is medically necessary to ensure accurate and safe treatment delivery.  The ability to quantify intrafraction motion without excessive ionizing radiation dose can only be performed with optical surface tracking. Accordingly, surface imaging offers the opportunity to obtain 3D measurements of patient position throughout IMRT and 3D treatments without excessive radiation exposure.  I am ordering optical surface tracking for this patient's upcoming course of radiotherapy. ________________________________  Gery Pray, MD 08/08/2015 2:37 PM    Reference:   Particia Jasper, et al. Surface imaging-based analysis of intrafraction motion for breast radiotherapy patients.Journal of [place], n. 6, nov. 2014. ISSN [id number].   Available at: <http://www.jacmp.org/index.php/jacmp/article/view/4957>.

## 2015-08-08 NOTE — Addendum Note (Signed)
Encounter addended by: Gery Pray, MD on: 08/08/2015  2:37 PM<BR>     Documentation filed: Notes Section

## 2015-08-09 ENCOUNTER — Ambulatory Visit
Admission: RE | Admit: 2015-08-09 | Discharge: 2015-08-09 | Disposition: A | Discharge status: Home / Self Care | Payer: Medicare Other | Source: Ambulatory Visit | Attending: Radiation Oncology | Admitting: Radiation Oncology

## 2015-08-09 DIAGNOSIS — Z510 Encounter for antineoplastic radiation therapy: Secondary | ICD-10-CM | POA: Diagnosis not present

## 2015-08-09 DIAGNOSIS — Z79899 Other long term (current) drug therapy: Secondary | ICD-10-CM | POA: Diagnosis not present

## 2015-08-09 DIAGNOSIS — Z170 Estrogen receptor positive status [ER+]: Secondary | ICD-10-CM | POA: Diagnosis not present

## 2015-08-09 DIAGNOSIS — C50212 Malignant neoplasm of upper-inner quadrant of left female breast: Secondary | ICD-10-CM | POA: Diagnosis not present

## 2015-08-09 DIAGNOSIS — Z79811 Long term (current) use of aromatase inhibitors: Secondary | ICD-10-CM | POA: Diagnosis not present

## 2015-08-09 DIAGNOSIS — Z7982 Long term (current) use of aspirin: Secondary | ICD-10-CM | POA: Diagnosis not present

## 2015-08-09 NOTE — Progress Notes (Signed)
  Radiation Oncology         [phone] ________________________________  Name: Lindsey Barrett MRN: [id number]  Date: 08/09/2015  DOB: 16-Jun-1945  Simulation Verification Note    ICD-9-CM ICD-10-CM   1. 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[place]) 174.2 C50.212     Status: outpatient  NARRATIVE: The patient was brought to the treatment unit and placed in the planned treatment position. The clinical setup was verified. Then port films were obtained and uploaded to the radiation oncology medical record software.  The treatment beams were carefully compared against the planned radiation fields. The position location and shape of the radiation fields was reviewed. They targeted volume of tissue appears to be appropriately covered by the radiation beams. Organs at risk appear to be excluded as planned.  Based on my personal review, I approved the simulation verification. The patient's treatment will proceed as planned.  -----------------------------------  Blair Promise, PhD, MD

## 2015-08-10 ENCOUNTER — Ambulatory Visit (INDEPENDENT_AMBULATORY_CARE_PROVIDER_SITE_OTHER): Payer: Medicare Other | Admitting: Internal Medicine

## 2015-08-10 ENCOUNTER — Other Ambulatory Visit (INDEPENDENT_AMBULATORY_CARE_PROVIDER_SITE_OTHER): Payer: Medicare Other

## 2015-08-10 ENCOUNTER — Encounter: Payer: Self-pay | Admitting: Internal Medicine

## 2015-08-10 ENCOUNTER — Ambulatory Visit
Admission: RE | Admit: 2015-08-10 | Discharge: 2015-08-10 | Disposition: A | Discharge status: Home / Self Care | Payer: Medicare Other | Source: Ambulatory Visit | Attending: Radiation Oncology | Admitting: Radiation Oncology

## 2015-08-10 VITALS — BP 116/76 | HR 69 | Temp 98.0°F | Resp 16 | Wt 176.0 lb

## 2015-08-10 DIAGNOSIS — Z170 Estrogen receptor positive status [ER+]: Secondary | ICD-10-CM | POA: Diagnosis not present

## 2015-08-10 DIAGNOSIS — Z0000 Encounter for general adult medical examination without abnormal findings: Secondary | ICD-10-CM | POA: Diagnosis not present

## 2015-08-10 DIAGNOSIS — R739 Hyperglycemia, unspecified: Secondary | ICD-10-CM

## 2015-08-10 DIAGNOSIS — C50212 Malignant neoplasm of upper-inner quadrant of left female breast: Secondary | ICD-10-CM | POA: Diagnosis not present

## 2015-08-10 DIAGNOSIS — E785 Hyperlipidemia, unspecified: Secondary | ICD-10-CM

## 2015-08-10 DIAGNOSIS — R7303 Prediabetes: Secondary | ICD-10-CM

## 2015-08-10 DIAGNOSIS — Z510 Encounter for antineoplastic radiation therapy: Secondary | ICD-10-CM | POA: Diagnosis not present

## 2015-08-10 DIAGNOSIS — Z139 Encounter for screening, unspecified: Secondary | ICD-10-CM | POA: Diagnosis not present

## 2015-08-10 DIAGNOSIS — Z79811 Long term (current) use of aromatase inhibitors: Secondary | ICD-10-CM | POA: Diagnosis not present

## 2015-08-10 DIAGNOSIS — Z79899 Other long term (current) drug therapy: Secondary | ICD-10-CM | POA: Diagnosis not present

## 2015-08-10 DIAGNOSIS — Z7982 Long term (current) use of aspirin: Secondary | ICD-10-CM | POA: Diagnosis not present

## 2015-08-10 LAB — CBC WITH DIFFERENTIAL/PLATELET
BASOS PCT: 0.4 % (ref 0.0–3.0)
Basophils Absolute: 0 10*3/uL (ref 0.0–0.1)
EOS PCT: 2.8 % (ref 0.0–5.0)
Eosinophils Absolute: 0.2 10*3/uL (ref 0.0–0.7)
HCT: 41.4 % (ref 36.0–46.0)
Hemoglobin: 13.8 g/dL (ref 12.0–15.0)
LYMPHS ABS: 1.6 10*3/uL (ref 0.7–4.0)
Lymphocytes Relative: 26.8 % (ref 12.0–46.0)
MCHC: 33.3 g/dL (ref 30.0–36.0)
MCV: 86.1 fl (ref 78.0–100.0)
MONO ABS: 0.5 10*3/uL (ref 0.1–1.0)
Monocytes Relative: 9.1 % (ref 3.0–12.0)
NEUTROS ABS: 3.7 10*3/uL (ref 1.4–7.7)
NEUTROS PCT: 60.9 % (ref 43.0–77.0)
PLATELETS: 224 10*3/uL (ref 150.0–400.0)
RBC: 4.8 Mil/uL (ref 3.87–5.11)
RDW: 14 % (ref 11.5–15.5)
WBC: 6 10*3/uL (ref 4.0–10.5)

## 2015-08-10 LAB — TSH: TSH: 2.05 u[IU]/mL (ref 0.35–4.50)

## 2015-08-10 LAB — COMPREHENSIVE METABOLIC PANEL
ALK PHOS: 49 U/L (ref 39–117)
ALT: 22 U/L (ref 0–35)
AST: 25 U/L (ref 0–37)
Albumin: 4.4 g/dL (ref 3.5–5.2)
BUN: 12 mg/dL (ref 6–23)
CHLORIDE: 103 meq/L (ref 96–112)
CO2: 32 mEq/L (ref 19–32)
Calcium: 9.4 mg/dL (ref 8.4–10.5)
Creatinine, Ser: 0.62 mg/dL (ref 0.40–1.20)
GFR: 101.71 mL/min (ref >60.00)
GLUCOSE: 100 mg/dL — AB (ref 70–99)
POTASSIUM: 4.2 meq/L (ref 3.5–5.1)
SODIUM: 140 meq/L (ref 135–145)
TOTAL PROTEIN: 7.3 g/dL (ref 6.0–8.3)
Total Bilirubin: 1.4 mg/dL — ABNORMAL HIGH (ref 0.2–1.2)

## 2015-08-10 LAB — LIPID PANEL
Cholesterol: 218 mg/dL — ABNORMAL HIGH (ref 0–200)
HDL: 53.1 mg/dL (ref >39.00)
LDL Cholesterol: 138 mg/dL — ABNORMAL HIGH (ref 0–99)
NONHDL: 164.89
Total CHOL/HDL Ratio: 4
Triglycerides: 134 mg/dL (ref 0.0–149.0)
VLDL: 26.8 mg/dL (ref 0.0–40.0)

## 2015-08-10 LAB — HEPATITIS C ANTIBODY: HCV Ab: NEGATIVE

## 2015-08-10 LAB — HEMOGLOBIN A1C: HEMOGLOBIN A1C: 6.2 % (ref 4.6–6.5)

## 2015-08-10 MED ORDER — ROSUVASTATIN CALCIUM 40 MG PO TABS: 40.0000 mg | ORAL_TABLET | Freq: Every day | ORAL | Status: DC

## 2015-08-10 NOTE — Assessment & Plan Note (Signed)
Stressed low sugar/carb diet Work on exercise and weight loss Check a1c

## 2015-08-10 NOTE — Patient Instructions (Addendum)
Lindsey Barrett , Thank you for taking time to come for your Medicare Wellness Visit. I appreciate your ongoing commitment to your health goals. Please review the following plan we discussed and let me know if I can assist you in the future.   These are the goals we discussed: Goals    Exercise 30 minutes for 5-6 days a week      This is a list of the screening recommended for you and due dates:  Health Maintenance  Topic Date Due  .  Hepatitis C: One time screening is recommended by Center for Disease Control  (CDC) for  adults born from [birth year] through 1963.   22-Sep-1945  . Pneumonia vaccines (2 of 2 - PCV13) 07/02/2013  . Colon Cancer Screening  06/26/2015  . Flu Shot  12/06/2015  . Mammogram  05/17/2017  . Tetanus Vaccine  08/01/2020  . DEXA scan (bone density measurement)  Completed  . Shingles Vaccine  Addressed      Test(s) ordered today. Your results will be released to [place] (or called to you) after review, usually within 72hours after test completion. If any changes need to be made, you will be notified at that same time.  All other Health Maintenance issues reviewed.   All recommended immunizations and age-appropriate screenings are up-to-date or discussed.  No immunizations administered today.   Medications reviewed and updated.  No changes recommended at this time.  Your prescription(s) have been submitted to your pharmacy. Please take as directed and contact our office if you believe you are having problem(s) with the medication(s).   Please followup annually   Health Maintenance, Female Adopting a healthy lifestyle and getting preventive care can go a long way to promote health and wellness. Talk with your health care provider about what schedule of regular examinations is right for you. This is a good chance for you to check in with your provider about disease prevention and staying healthy. In between checkups, there are plenty of things you can do on your own.  Experts have done a lot of research about which lifestyle changes and preventive measures are most likely to keep you healthy. Ask your health care provider for more information. WEIGHT AND DIET  Eat a healthy diet  Be sure to include plenty of vegetables, fruits, low-fat dairy products, and lean protein.  Do not eat a lot of foods high in solid fats, added sugars, or salt.  Get regular exercise. This is one of the most important things you can do for your health.  Most adults should exercise for at least 150 minutes each week. The exercise should increase your heart rate and make you sweat (moderate-intensity exercise).  Most adults should also do strengthening exercises at least twice a week. This is in addition to the moderate-intensity exercise.  Maintain a healthy weight  Body mass index (BMI) is a measurement that can be used to identify possible weight problems. It estimates body fat based on height and weight. Your health care provider can help determine your BMI and help you achieve or maintain a healthy weight.  For females 65 years of age and older:   A BMI below 18.5 is considered underweight.  A BMI of 18.5 to 24.9 is normal.  A BMI of 25 to 29.9 is considered overweight.  A BMI of 30 and above is considered obese.  Watch levels of cholesterol and blood lipids  You should start having your blood tested for lipids and cholesterol at 68 years  of age, then have this test every 5 years.  You may need to have your cholesterol levels checked more often if:  Your lipid or cholesterol levels are high.  You are older than 68 years of age.  You are at high risk for heart disease.  CANCER SCREENING   Lung Cancer  Lung cancer screening is recommended for adults 35-61 years old who are at high risk for lung cancer because of a history of smoking.  A yearly low-dose CT scan of the lungs is recommended for people who:  Currently smoke.  Have quit within the past 15  years.  Have at least a 30-pack-year history of smoking. A pack year is smoking an average of one pack of cigarettes a day for 1 year.  Yearly screening should continue until it has been 15 years since you quit.  Yearly screening should stop if you develop a health problem that would prevent you from having lung cancer treatment.  Breast Cancer  Practice breast self-awareness. This means understanding how your breasts normally appear and feel.  It also means doing regular breast self-exams. Let your health care provider know about any changes, no matter how small.  If you are in your 20s or 30s, you should have a clinical breast exam (CBE) by a health care provider every 1-3 years as part of a regular health exam.  If you are 35 or older, have a CBE every year. Also consider having a breast X-ray (mammogram) every year.  If you have a family history of breast cancer, talk to your health care provider about genetic screening.  If you are at high risk for breast cancer, talk to your health care provider about having an MRI and a mammogram every year.  Breast cancer gene (BRCA) assessment is recommended for women who have family members with BRCA-related cancers. BRCA-related cancers include:  Breast.  Ovarian.  Tubal.  Peritoneal cancers.  Results of the assessment will determine the need for genetic counseling and BRCA1 and BRCA2 testing. Cervical Cancer Your health care provider may recommend that you be screened regularly for cancer of the pelvic organs (ovaries, uterus, and vagina). This screening involves a pelvic examination, including checking for microscopic changes to the surface of your cervix (Pap test). You may be encouraged to have this screening done every 3 years, beginning at age 105.  For women ages 38-65, health care providers may recommend pelvic exams and Pap testing every 3 years, or they may recommend the Pap and pelvic exam, combined with testing for human  papilloma virus (HPV), every 5 years. Some types of HPV increase your risk of cervical cancer. Testing for HPV may also be done on women of any age with unclear Pap test results.  Other health care providers may not recommend any screening for nonpregnant women who are considered low risk for pelvic cancer and who do not have symptoms. Ask your health care provider if a screening pelvic exam is right for you.  If you have had past treatment for cervical cancer or a condition that could lead to cancer, you need Pap tests and screening for cancer for at least 20 years after your treatment. If Pap tests have been discontinued, your risk factors (such as having a new sexual partner) need to be reassessed to determine if screening should resume. Some women have medical problems that increase the chance of getting cervical cancer. In these cases, your health care provider may recommend more frequent screening and Pap tests.  Colorectal Cancer  This type of cancer can be detected and often prevented.  Routine colorectal cancer screening usually begins at 68 years of age and continues through 68 years of age.  Your health care provider may recommend screening at an earlier age if you have risk factors for colon cancer.  Your health care provider may also recommend using home test kits to check for hidden blood in the stool.  A small camera at the end of a tube can be used to examine your colon directly (sigmoidoscopy or colonoscopy). This is done to check for the earliest forms of colorectal cancer.  Routine screening usually begins at age 71.  Direct examination of the colon should be repeated every 5-10 years through 68 years of age. However, you may need to be screened more often if early forms of precancerous polyps or small growths are found. Skin Cancer  Check your skin from head to toe regularly.  Tell your health care provider about any new moles or changes in moles, especially if there is a  change in a mole's shape or color.  Also tell your health care provider if you have a mole that is larger than the size of a pencil eraser.  Always use sunscreen. Apply sunscreen liberally and repeatedly throughout the day.  Protect yourself by wearing long sleeves, pants, a wide-brimmed hat, and sunglasses whenever you are outside. HEART DISEASE, DIABETES, AND HIGH BLOOD PRESSURE   High blood pressure causes heart disease and increases the risk of stroke. High blood pressure is more likely to develop in:  People who have blood pressure in the high end of the normal range (130-139/85-89 mm Hg).  People who are overweight or obese.  People who are African American.  If you are 80-72 years of age, have your blood pressure checked every 3-5 years. If you are 45 years of age or older, have your blood pressure checked every year. You should have your blood pressure measured twice--once when you are at a hospital or clinic, and once when you are not at a hospital or clinic. Record the average of the two measurements. To check your blood pressure when you are not at a hospital or clinic, you can use:  An automated blood pressure machine at a pharmacy.  A home blood pressure monitor.  If you are between 19 years and 77 years old, ask your health care provider if you should take aspirin to prevent strokes.  Have regular diabetes screenings. This involves taking a blood sample to check your fasting blood sugar level.  If you are at a normal weight and have a low risk for diabetes, have this test once every three years after 68 years of age.  If you are overweight and have a high risk for diabetes, consider being tested at a younger age or more often. PREVENTING INFECTION  Hepatitis B  If you have a higher risk for hepatitis B, you should be screened for this virus. You are considered at high risk for hepatitis B if:  You were born in a country where hepatitis B is common. Ask your health  care provider which countries are considered high risk.  Your parents were born in a high-risk country, and you have not been immunized against hepatitis B (hepatitis B vaccine).  You have HIV or AIDS.  You use needles to inject street drugs.  You live with someone who has hepatitis B.  You have had sex with someone who has hepatitis B.  You get hemodialysis treatment.  You take certain medicines for conditions, including cancer, organ transplantation, and autoimmune conditions. Hepatitis C  Blood testing is recommended for:  Everyone born from [birth year] through 1963.  Anyone with known risk factors for hepatitis C. Sexually transmitted infections (STIs)  You should be screened for sexually transmitted infections (STIs) including gonorrhea and chlamydia if:  You are sexually active and are younger than 68 years of age.  You are older than 68 years of age and your health care provider tells you that you are at risk for this type of infection.  Your sexual activity has changed since you were last screened and you are at an increased risk for chlamydia or gonorrhea. Ask your health care provider if you are at risk.  If you do not have HIV, but are at risk, it may be recommended that you take a prescription medicine daily to prevent HIV infection. This is called pre-exposure prophylaxis (PrEP). You are considered at risk if:  You are sexually active and do not regularly use condoms or know the HIV status of your partner(s).  You take drugs by injection.  You are sexually active with a partner who has HIV. Talk with your health care provider about whether you are at high risk of being infected with HIV. If you choose to begin PrEP, you should first be tested for HIV. You should then be tested every 3 months for as long as you are taking PrEP.  PREGNANCY   If you are premenopausal and you may become pregnant, ask your health care provider about preconception counseling.  If you may  become pregnant, take 400 to 800 micrograms (mcg) of folic acid every day.  If you want to prevent pregnancy, talk to your health care provider about birth control (contraception). OSTEOPOROSIS AND MENOPAUSE   Osteoporosis is a disease in which the bones lose minerals and strength with aging. This can result in serious bone fractures. Your risk for osteoporosis can be identified using a bone density scan.  If you are 45 years of age or older, or if you are at risk for osteoporosis and fractures, ask your health care provider if you should be screened.  Ask your health care provider whether you should take a calcium or vitamin D supplement to lower your risk for osteoporosis.  Menopause may have certain physical symptoms and risks.  Hormone replacement therapy may reduce some of these symptoms and risks. Talk to your health care provider about whether hormone replacement therapy is right for you.  HOME CARE INSTRUCTIONS   Schedule regular health, dental, and eye exams.  Stay current with your immunizations.   Do not use any tobacco products including cigarettes, chewing tobacco, or electronic cigarettes.  If you are pregnant, do not drink alcohol.  If you are breastfeeding, limit how much and how often you drink alcohol.  Limit alcohol intake to no more than 1 drink per day for nonpregnant women. One drink equals 12 ounces of beer, 5 ounces of wine, or 1 ounces of hard liquor.  Do not use street drugs.  Do not share needles.  Ask your health care provider for help if you need support or information about quitting drugs.  Tell your health care provider if you often feel depressed.  Tell your health care provider if you have ever been abused or do not feel safe at home.   This information is not intended to replace advice given to you by your health care provider.  Make sure you discuss any questions you have with your health care provider.   Document Released: 11/06/2010  Document Revised: 05/14/2014 Document Reviewed: 03/25/2013 Elsevier Interactive Patient Education [employer].

## 2015-08-10 NOTE — Progress Notes (Signed)
Subjective:    Patient ID: Lindsey Barrett, female    DOB: 22-Sep-1945, 70 y.o.   MRN: [id number]  HPI She is here to establish with a new pcp.  She is here for a wellness exam.   Breast cancer:  She was diagnosed in January.  She had a left lumpectomy.  She had radiation and is will start taking arimidex after completing radiation in June.    Here for medicare wellness exam.   I have personally reviewed and have noted 1.The patient's medical and social history 2.Their use of alcohol, tobacco or illicit drugs 3.Their current medications and supplements 4.The patient's functional ability including ADL's, fall risks, home safety risks and                 hearing or visual impairment. 5.Diet and physical activities 6.Evidence for depression or mood disorders    Are there smokers in your home (other than you)? No  Risk Factors Exercise: none Dietary issues discussed: eating too many carbs and sweets - trying to cut back  Cardiac risk factors: advanced age (older than 80 for men, 4 for women), hyperlipidemia, and obesity (BMI >= 30 kg/m2).  Depression Screen  Have you felt down, depressed or hopeless? No  Have you felt little interest or pleasure in doing things?  No Activities of Daily Living In your present state of health, do you have any difficulty performing the following activities?:  Driving? No Managing money?  No Feeding yourself? No Getting from bed to chair? No Climbing a flight of stairs? No Preparing food and eating?: No Bathing or showering? No Getting dressed: No Getting to/using the toilet? No Moving around from place to place: No In the past year have you fallen or had a near fall?: No   Are you sexually active?  No  Do you have more than one partner?  N/A  Hearing Difficulties: No Do you often ask people to speak up or repeat themselves? No Do you experience ringing or noises in your ears?  No Do you have difficulty understanding soft or whispered voices? No Vision:              Any change in vision: no              Up to date with eye exam:  Up to date Memory:  Do you feel that you have a problem with memory? No  Do you often misplace items? No  Do you feel safe at home?  Yes  Cognitive Testing  Alert, Orientated? Yes  Normal Appearance? Yes  Recall of three objects?  Yes  Can perform simple calculations? Yes  Displays appropriate judgment? Yes  Can read the correct time from a watch face? Yes   Advanced Directives have been discussed with the patient? Yes, in place   Medications and allergies reviewed with patient and updated if appropriate.  Patient Active Problem List   Diagnosis Date Noted  . 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[place]) 05/20/2015  . Open-angle glaucoma 06/17/2014  . Vitamin D deficiency 10/18/2011  . HSV infection   . Atrophic vaginitis   . VARICOSE VEINS, LOWER EXTREMITIES 09/28/2009  . HEADACHE 09/28/2009  . GERD 07/14/2009  . NONSPECIFIC ABNORMAL ELECTROCARDIOGRAM 07/14/2009  . Hyperglycemia 01/01/2008  . Hyperlipidemia 12/26/2006  . GILBERT'S SYNDROME 08/09/2006  . DEGENERATIVE DISC DISEASE 08/09/2006    Current Outpatient Prescriptions on File Prior to Visit  Medication Sig Dispense Refill  .  aspirin 81 MG tablet Take 81 mg by mouth as directed.     . bimatoprost (LUMIGAN) 0.01 % SOLN Place 1 drop into both eyes at bedtime.    . brinzolamide (AZOPT) 1 % ophthalmic suspension 1 drop 2 (two) times daily.    . calcium carbonate (TUMS - DOSED IN MG ELEMENTAL CALCIUM) 500 MG chewable tablet Chew 1 tablet by mouth as needed for indigestion or heartburn.    . Ergocalciferol (VITAMIN D2) 2000 UNITS TABS Take 1 tablet by mouth daily.    [name] ibuprofen (ADVIL,MOTRIN) 200 MG tablet Take 200 mg by mouth every 6 (six) hours as needed.    . ranitidine (ZANTAC) 150 MG tablet Take 1 tablet (150 mg total) by mouth 2 (two) times  daily. 60 tablet 0  . rosuvastatin (CRESTOR) 40 MG tablet Take 1 tablet (40 mg total) by mouth daily. 90 tablet 3  . anastrozole (ARIMIDEX) 1 MG tablet Take 1 tablet (1 mg total) by mouth daily. (Patient not taking: Reported on 07/13/2015) 90 tablet 3  . HYDROcodone-acetaminophen (NORCO) 10-325 MG tablet Take 1 tablet by mouth every 6 (six) hours as needed. (Patient not taking: Reported on 07/13/2015) 20 tablet 0  . [DISCONTINUED] metoprolol tartrate (LOPRESSOR) 25 MG tablet Take 1 tablet (25 mg total) by mouth as directed. 1 pill every 8 hours as needed for tachycardia or palpitations. 30 tablet 2   No current facility-administered medications on file prior to visit.    Past Medical History  Diagnosis Date  . Fasting hyperglycemia   . DDD (degenerative disc disease)   . Gilbert syndrome   . Breast cyst     x3  . Hyperlipidemia   . Anxiety   . GERD (gastroesophageal reflux disease)   . Diverticulosis   . HSV infection   . Atrophic vaginitis   . STD (sexually transmitted disease)     HSV  . Glaucoma   . 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[place]) 05/20/2015  . Breast cancer ([place])   . History of hiatal hernia     Past Surgical History  Procedure Laterality Date  . Appendectomy    . Breast cyst aspiration      x2 right ,x1 left  . Wisdom tooth extraction    . Dilation and curettage of uterus    . Upper gi endoscopy  10/2010    Dr [name]  . Pelvic laparoscopy    . Colonoscopy  2007    neg  . Radioactive seed guided mastectomy with axillary sentinel lymph node biopsy Left 05/31/2015    Procedure: LEFT BREAST RADIOACTIVE SEED GUIDED LUMPECTOMY WITH LEFT AXILLARY SENTINEL LYMPH NODE BIOPSY;  Surgeon: [name], MD;  Location: [facility];  Service: General;  Laterality: Left;  . Re-excision of breast lumpectomy Left 07/01/2015    Procedure: RE-EXCISION OF LEFT BREAST INFERIOR MARGIN;  Surgeon: [name], MD;  Location: [facility];  Service: General;  Laterality: Left;    Social History   Social History  . Marital Status: Divorced    Spouse Name: N/A  . Number of Children: N/A  . Years of Education: N/A   Occupational History  . 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 Smoking status: Former Smoker    Types: Cigarettes    Quit date: 03/28/1999  . Smokeless tobacco: Never Used     Comment: smoked 1967-2000, up to 3/4 ppd  . Alcohol Use: 4.2 oz/week  7 Standard drinks or equivalent per week     Comment:  7/ week  . Drug Use: No  . Sexual Activity: No     Comment: 1st intercourse 70 yo-More than 5 partners   Other Topics Concern  . 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      Regular exercise-yes: gym once per week    Family History  Problem Relation Age of Onset  . Lung cancer Mother     smoker  . Endometriosis Mother   . Cancer Mother     Bladder cancer  . Liver cancer Maternal Uncle   . Stomach cancer Maternal Grandmother   . Stroke Paternal Grandfather 19  . Leukemia Maternal Uncle   . Breast cancer Paternal Aunt     great paternal aunt-Age 6's  . Colon cancer Paternal Aunt     great paternal aunt  . Coronary artery disease Father   . Cancer Father     intestinal carcinoid tumors  . Sudden death Sister     86 week old, congenital deformity  . Vascular Disease Paternal Grandmother     thoracic aneurysm  . Diabetes Neg Hx     Review of Systems  Constitutional: Negative for fever, chills and fatigue.  HENT: Negative for hearing loss.   Eyes: Negative for visual disturbance.  Respiratory: Negative for cough, shortness of breath and wheezing.   Cardiovascular: Negative for chest pain and palpitations.  Gastrointestinal: Negative for nausea, abdominal pain, diarrhea, constipation and blood in stool.  Genitourinary: Negative for dysuria and hematuria.  Musculoskeletal: Positive for  arthralgias.  Skin: Negative for color change and rash.  Neurological: Positive for headaches (occ sharp pain temple area - transient). Negative for dizziness and light-headedness.  Psychiatric/Behavioral: Negative for dysphoric mood. The patient is nervous/anxious (regarding radiation treatment).        Objective:   Filed Vitals:   08/10/15 0807  BP: 116/76  Pulse: 69  Temp: 98 F (36.7 C)  Resp: 16   Filed Weights   08/10/15 0807  Weight: 176 lb (79.833 kg)   Body mass index is 30.2 kg/(m^2).   Physical Exam Constitutional: She appears well-developed and well-nourished. No distress.  HENT:  Head: Normocephalic and atraumatic.  Right Ear: External ear normal. Normal ear canal and TM Left Ear: External ear normal.  Normal ear canal and TM Mouth/Throat: Oropharynx is clear and moist.  Eyes: Conjunctivae and EOM are normal.  Neck: Neck supple. No tracheal deviation present. No thyromegaly present.  No carotid bruit  Cardiovascular: Normal rate, regular rhythm and normal heart sounds.   No murmur heard.  No edema. Pulmonary/Chest: Effort normal and breath sounds normal. No respiratory distress. She has no wheezes. She has no rales.  Breast: deferred to Gyn/Oncology Abdominal: Soft. She exhibits no distension. There is no tenderness.  Lymphadenopathy: She has no cervical adenopathy.  Skin: Skin is warm and dry. She is not diaphoretic.  Psychiatric: She has a normal mood and affect. Her behavior is normal.        Assessment & Plan:    Wellness Exam Immunizations - discussed prevnar Colonoscopy - due this year, will get it done later this year Mammogram - up to date dexa up to date Gyn  Up to date Eye exam - up to date Hearing loss - none Memory concerns/difficulties - none Independent of ADLs - fully independent  See Problem List for Assessment and Plan of chronic medical problems.  Follow up annually

## 2015-08-10 NOTE — Progress Notes (Signed)
Pre visit review using our clinic review tool, if applicable. No additional management support is needed unless otherwise documented below in the visit note. 

## 2015-08-10 NOTE — Assessment & Plan Note (Signed)
Check lipid panel On crestor 40 mg daily

## 2015-08-11 ENCOUNTER — Ambulatory Visit
Admission: RE | Admit: 2015-08-11 | Discharge: 2015-08-11 | Disposition: A | Discharge status: Home / Self Care | Payer: Medicare Other | Source: Ambulatory Visit | Attending: Radiation Oncology | Admitting: Radiation Oncology

## 2015-08-11 DIAGNOSIS — Z7982 Long term (current) use of aspirin: Secondary | ICD-10-CM | POA: Diagnosis not present

## 2015-08-11 DIAGNOSIS — Z79899 Other long term (current) drug therapy: Secondary | ICD-10-CM | POA: Diagnosis not present

## 2015-08-11 DIAGNOSIS — C50212 Malignant neoplasm of upper-inner quadrant of left female breast: Secondary | ICD-10-CM | POA: Diagnosis not present

## 2015-08-11 DIAGNOSIS — Z510 Encounter for antineoplastic radiation therapy: Secondary | ICD-10-CM | POA: Diagnosis not present

## 2015-08-11 DIAGNOSIS — Z170 Estrogen receptor positive status [ER+]: Secondary | ICD-10-CM | POA: Diagnosis not present

## 2015-08-11 DIAGNOSIS — Z79811 Long term (current) use of aromatase inhibitors: Secondary | ICD-10-CM | POA: Diagnosis not present

## 2015-08-12 ENCOUNTER — Ambulatory Visit
Admission: RE | Admit: 2015-08-12 | Discharge: 2015-08-12 | Disposition: A | Discharge status: Home / Self Care | Payer: Medicare Other | Source: Ambulatory Visit | Attending: Radiation Oncology | Admitting: Radiation Oncology

## 2015-08-12 DIAGNOSIS — Z170 Estrogen receptor positive status [ER+]: Secondary | ICD-10-CM | POA: Diagnosis not present

## 2015-08-12 DIAGNOSIS — Z79811 Long term (current) use of aromatase inhibitors: Secondary | ICD-10-CM | POA: Diagnosis not present

## 2015-08-12 DIAGNOSIS — Z510 Encounter for antineoplastic radiation therapy: Secondary | ICD-10-CM | POA: Diagnosis not present

## 2015-08-12 DIAGNOSIS — C50212 Malignant neoplasm of upper-inner quadrant of left female breast: Secondary | ICD-10-CM | POA: Diagnosis not present

## 2015-08-12 DIAGNOSIS — Z7982 Long term (current) use of aspirin: Secondary | ICD-10-CM | POA: Diagnosis not present

## 2015-08-12 DIAGNOSIS — Z79899 Other long term (current) drug therapy: Secondary | ICD-10-CM | POA: Diagnosis not present

## 2015-08-15 ENCOUNTER — Ambulatory Visit
Admission: RE | Admit: 2015-08-15 | Discharge: 2015-08-15 | Disposition: A | Discharge status: Home / Self Care | Payer: Medicare Other | Source: Ambulatory Visit | Attending: Radiation Oncology | Admitting: Radiation Oncology

## 2015-08-15 DIAGNOSIS — C50212 Malignant neoplasm of upper-inner quadrant of left female breast: Secondary | ICD-10-CM | POA: Diagnosis not present

## 2015-08-15 DIAGNOSIS — Z510 Encounter for antineoplastic radiation therapy: Secondary | ICD-10-CM | POA: Diagnosis not present

## 2015-08-15 DIAGNOSIS — Z7982 Long term (current) use of aspirin: Secondary | ICD-10-CM | POA: Diagnosis not present

## 2015-08-15 DIAGNOSIS — Z170 Estrogen receptor positive status [ER+]: Secondary | ICD-10-CM | POA: Diagnosis not present

## 2015-08-15 DIAGNOSIS — Z79899 Other long term (current) drug therapy: Secondary | ICD-10-CM | POA: Diagnosis not present

## 2015-08-15 DIAGNOSIS — Z79811 Long term (current) use of aromatase inhibitors: Secondary | ICD-10-CM | POA: Diagnosis not present

## 2015-08-16 ENCOUNTER — Ambulatory Visit
Admission: RE | Admit: 2015-08-16 | Discharge: 2015-08-16 | Disposition: A | Discharge status: Home / Self Care | Payer: Medicare Other | Source: Ambulatory Visit | Attending: Radiation Oncology | Admitting: Radiation Oncology

## 2015-08-16 ENCOUNTER — Encounter: Payer: Self-pay | Admitting: Radiation Oncology

## 2015-08-16 VITALS — BP 125/62 | HR 72 | Temp 98.3°F | Ht 64.0 in | Wt 172.6 lb

## 2015-08-16 DIAGNOSIS — C50212 Malignant neoplasm of upper-inner quadrant of left female breast: Secondary | ICD-10-CM

## 2015-08-16 DIAGNOSIS — Z510 Encounter for antineoplastic radiation therapy: Secondary | ICD-10-CM | POA: Diagnosis not present

## 2015-08-16 DIAGNOSIS — Z923 Personal history of irradiation: Secondary | ICD-10-CM | POA: Insufficient documentation

## 2015-08-16 DIAGNOSIS — Z7982 Long term (current) use of aspirin: Secondary | ICD-10-CM | POA: Diagnosis not present

## 2015-08-16 DIAGNOSIS — Z79899 Other long term (current) drug therapy: Secondary | ICD-10-CM | POA: Diagnosis not present

## 2015-08-16 DIAGNOSIS — Z79811 Long term (current) use of aromatase inhibitors: Secondary | ICD-10-CM | POA: Diagnosis not present

## 2015-08-16 DIAGNOSIS — Z170 Estrogen receptor positive status [ER+]: Secondary | ICD-10-CM | POA: Diagnosis not present

## 2015-08-16 MED ORDER — ALRA NON-METALLIC DEODORANT (RAD-ONC)
1.0000 "application " | Freq: Once | TOPICAL | Status: AC
  Administered 2015-08-16: 1 via TOPICAL

## 2015-08-16 MED ORDER — RADIAPLEXRX EX GEL
Freq: Once | CUTANEOUS | Status: AC
  Administered 2015-08-16: 17:00:00 via TOPICAL

## 2015-08-16 NOTE — Progress Notes (Addendum)
Lindsey Barrett is here for her 5th fraction of radiation to her Left Breast. She denies pain or fatigue. She reports some aniexty issues related to her radiation treatment, but feels like it is improving with each treatment. She denies any changes to her breast at this time.   BP 125/62 mmHg  Pulse 72  Temp(Src) 98.3 F (36.8 C)  Ht 5\' 4"  (1.626 m)  Wt 172 lb 9.6 oz (78.291 kg)  BMI 29.61 kg/m2

## 2015-08-16 NOTE — Progress Notes (Signed)
  Radiation Oncology         (336) [phone] ________________________________  Name: Lindsey Barrett MRN: [id number]  Date: 08/16/2015  DOB: 31-Mar-1946  Weekly Radiation Therapy Management    ICD-9-CM ICD-10-CM   1. 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HCC) 174.2 C50.212 hyaluronate sodium (RADIAPLEXRX) gel     non-metallic deodorant (ALRA) 1 application     Current Dose: 9 Gy     Planned Dose:  50.4 Narrative . . . . . . . . The patient presents for routine under treatment assessment.                                   Lindsey Barrett is here for her 5th fraction of radiation to her Left Breast. She denies pain or fatigue. She reports some aniexty issues related to her radiation treatment, but feels like it is improving with each treatment. She denies any changes to her breast at this time.                                  Set-up films were reviewed.                                 The chart was checked. Physical Findings. . .  height is 5\' 4"  (1.626 m) and weight is 172 lb 9.6 oz (78.291 kg). Her temperature is 98.3 F (36.8 C). Her blood pressure is 125/62 and her pulse is 72. . Weight essentially stable.  No significant changes.  The lungs are clear. The heart has a regular rhythm and rate. No significant radiation reaction along the left breast. Impression . . . . . . . The patient is tolerating radiation. Plan . . . . . . . . . . . . Continue treatment as planned.  ________________________________   Blair Promise, PhD, MD

## 2015-08-16 NOTE — Progress Notes (Signed)
Pt here for patient teaching.  Pt given Radiation and You booklet, skin care instructions, Alra deodorant and Radiaplex gel. Pt reports they have not watched the Radiation Therapy Education video, but was given the link to watch at home.  Reviewed areas of pertinence such as fatigue, skin changes, breast tenderness, breast swelling, cough, shortness of breath, earaches and taste changes . Pt able to give teach back of to pat skin, use unscented/gentle soap and drink plenty of water,apply Radiaplex bid, avoid applying anything to skin within 4 hours of treatment, avoid wearing an under wire bra and to use an electric razor if they must shave. Pt verbalizes understanding of information given and will contact nursing with any questions or concerns.     Http://rtanswers.org/treatmentinformation/whattoexpect/index

## 2015-08-17 ENCOUNTER — Ambulatory Visit
Admission: RE | Admit: 2015-08-17 | Discharge: 2015-08-17 | Disposition: A | Discharge status: Home / Self Care | Payer: Medicare Other | Source: Ambulatory Visit | Attending: Radiation Oncology | Admitting: Radiation Oncology

## 2015-08-17 ENCOUNTER — Telehealth: Payer: Self-pay | Admitting: *Deleted

## 2015-08-17 DIAGNOSIS — Z510 Encounter for antineoplastic radiation therapy: Secondary | ICD-10-CM | POA: Diagnosis not present

## 2015-08-17 DIAGNOSIS — Z79899 Other long term (current) drug therapy: Secondary | ICD-10-CM | POA: Diagnosis not present

## 2015-08-17 DIAGNOSIS — Z7982 Long term (current) use of aspirin: Secondary | ICD-10-CM | POA: Diagnosis not present

## 2015-08-17 DIAGNOSIS — Z79811 Long term (current) use of aromatase inhibitors: Secondary | ICD-10-CM | POA: Diagnosis not present

## 2015-08-17 DIAGNOSIS — C50212 Malignant neoplasm of upper-inner quadrant of left female breast: Secondary | ICD-10-CM | POA: Diagnosis not present

## 2015-08-17 DIAGNOSIS — Z170 Estrogen receptor positive status [ER+]: Secondary | ICD-10-CM | POA: Diagnosis not present

## 2015-08-17 NOTE — Telephone Encounter (Signed)
Left vm for pt to return call regarding needs during xrt. 

## 2015-08-18 ENCOUNTER — Ambulatory Visit
Admission: RE | Admit: 2015-08-18 | Discharge: 2015-08-18 | Disposition: A | Discharge status: Home / Self Care | Payer: Medicare Other | Source: Ambulatory Visit | Attending: Radiation Oncology | Admitting: Radiation Oncology

## 2015-08-18 DIAGNOSIS — Z7982 Long term (current) use of aspirin: Secondary | ICD-10-CM | POA: Diagnosis not present

## 2015-08-18 DIAGNOSIS — Z170 Estrogen receptor positive status [ER+]: Secondary | ICD-10-CM | POA: Diagnosis not present

## 2015-08-18 DIAGNOSIS — Z79811 Long term (current) use of aromatase inhibitors: Secondary | ICD-10-CM | POA: Diagnosis not present

## 2015-08-18 DIAGNOSIS — C50212 Malignant neoplasm of upper-inner quadrant of left female breast: Secondary | ICD-10-CM | POA: Diagnosis not present

## 2015-08-18 DIAGNOSIS — Z79899 Other long term (current) drug therapy: Secondary | ICD-10-CM | POA: Diagnosis not present

## 2015-08-18 DIAGNOSIS — Z510 Encounter for antineoplastic radiation therapy: Secondary | ICD-10-CM | POA: Diagnosis not present

## 2015-08-19 ENCOUNTER — Ambulatory Visit
Admission: RE | Admit: 2015-08-19 | Discharge: 2015-08-19 | Disposition: A | Discharge status: Home / Self Care | Payer: Medicare Other | Source: Ambulatory Visit | Attending: Radiation Oncology | Admitting: Radiation Oncology

## 2015-08-19 DIAGNOSIS — Z79899 Other long term (current) drug therapy: Secondary | ICD-10-CM | POA: Diagnosis not present

## 2015-08-19 DIAGNOSIS — Z510 Encounter for antineoplastic radiation therapy: Secondary | ICD-10-CM | POA: Diagnosis not present

## 2015-08-19 DIAGNOSIS — Z79811 Long term (current) use of aromatase inhibitors: Secondary | ICD-10-CM | POA: Diagnosis not present

## 2015-08-19 DIAGNOSIS — Z170 Estrogen receptor positive status [ER+]: Secondary | ICD-10-CM | POA: Diagnosis not present

## 2015-08-19 DIAGNOSIS — Z7982 Long term (current) use of aspirin: Secondary | ICD-10-CM | POA: Diagnosis not present

## 2015-08-19 DIAGNOSIS — C50212 Malignant neoplasm of upper-inner quadrant of left female breast: Secondary | ICD-10-CM | POA: Diagnosis not present

## 2015-08-22 ENCOUNTER — Telehealth: Payer: Self-pay | Admitting: Gastroenterology

## 2015-08-22 ENCOUNTER — Ambulatory Visit
Admission: RE | Admit: 2015-08-22 | Discharge: 2015-08-22 | Disposition: A | Discharge status: Home / Self Care | Payer: Medicare Other | Source: Ambulatory Visit | Attending: Radiation Oncology | Admitting: Radiation Oncology

## 2015-08-22 DIAGNOSIS — C50212 Malignant neoplasm of upper-inner quadrant of left female breast: Secondary | ICD-10-CM | POA: Diagnosis not present

## 2015-08-22 DIAGNOSIS — Z7982 Long term (current) use of aspirin: Secondary | ICD-10-CM | POA: Diagnosis not present

## 2015-08-22 DIAGNOSIS — Z510 Encounter for antineoplastic radiation therapy: Secondary | ICD-10-CM | POA: Diagnosis not present

## 2015-08-22 DIAGNOSIS — Z170 Estrogen receptor positive status [ER+]: Secondary | ICD-10-CM | POA: Diagnosis not present

## 2015-08-22 DIAGNOSIS — Z79811 Long term (current) use of aromatase inhibitors: Secondary | ICD-10-CM | POA: Diagnosis not present

## 2015-08-22 DIAGNOSIS — Z79899 Other long term (current) drug therapy: Secondary | ICD-10-CM | POA: Diagnosis not present

## 2015-08-22 NOTE — Telephone Encounter (Signed)
Yes that's fine. thanks

## 2015-08-22 NOTE — Telephone Encounter (Signed)
Due for colon recall. She is currently undergoing treatment for breast cancer and is wanting to wait until the fall to scheduled. Is this ok?

## 2015-08-23 ENCOUNTER — Ambulatory Visit
Admission: RE | Admit: 2015-08-23 | Discharge: 2015-08-23 | Disposition: A | Discharge status: Home / Self Care | Payer: Medicare Other | Source: Ambulatory Visit | Attending: Radiation Oncology | Admitting: Radiation Oncology

## 2015-08-23 ENCOUNTER — Emergency Department (HOSPITAL_BASED_OUTPATIENT_CLINIC_OR_DEPARTMENT_OTHER)
Admission: EM | Admit: 2015-08-23 | Discharge: 2015-08-23 | Disposition: A | Discharge status: Home / Self Care | Payer: Medicare Other | Attending: Emergency Medicine | Admitting: Emergency Medicine

## 2015-08-23 ENCOUNTER — Encounter (HOSPITAL_BASED_OUTPATIENT_CLINIC_OR_DEPARTMENT_OTHER): Payer: Self-pay | Admitting: Emergency Medicine

## 2015-08-23 ENCOUNTER — Encounter: Payer: Self-pay | Admitting: Radiation Oncology

## 2015-08-23 VITALS — BP 115/64 | HR 72 | Temp 98.4°F | Ht 64.0 in | Wt 174.0 lb

## 2015-08-23 DIAGNOSIS — E785 Hyperlipidemia, unspecified: Secondary | ICD-10-CM | POA: Diagnosis not present

## 2015-08-23 DIAGNOSIS — T310 Burns involving less than 10% of body surface: Secondary | ICD-10-CM | POA: Diagnosis not present

## 2015-08-23 DIAGNOSIS — Y929 Unspecified place or not applicable: Secondary | ICD-10-CM | POA: Diagnosis not present

## 2015-08-23 DIAGNOSIS — X131XXA Other contact with steam and other hot vapors, initial encounter: Secondary | ICD-10-CM | POA: Diagnosis not present

## 2015-08-23 DIAGNOSIS — Z7982 Long term (current) use of aspirin: Secondary | ICD-10-CM | POA: Insufficient documentation

## 2015-08-23 DIAGNOSIS — Z79811 Long term (current) use of aromatase inhibitors: Secondary | ICD-10-CM | POA: Diagnosis not present

## 2015-08-23 DIAGNOSIS — Z79899 Other long term (current) drug therapy: Secondary | ICD-10-CM | POA: Insufficient documentation

## 2015-08-23 DIAGNOSIS — C50212 Malignant neoplasm of upper-inner quadrant of left female breast: Secondary | ICD-10-CM | POA: Diagnosis not present

## 2015-08-23 DIAGNOSIS — Y939 Activity, unspecified: Secondary | ICD-10-CM | POA: Diagnosis not present

## 2015-08-23 DIAGNOSIS — Z170 Estrogen receptor positive status [ER+]: Secondary | ICD-10-CM | POA: Diagnosis not present

## 2015-08-23 DIAGNOSIS — T22212A Burn of second degree of left forearm, initial encounter: Secondary | ICD-10-CM | POA: Insufficient documentation

## 2015-08-23 DIAGNOSIS — Y999 Unspecified external cause status: Secondary | ICD-10-CM | POA: Insufficient documentation

## 2015-08-23 DIAGNOSIS — Z853 Personal history of malignant neoplasm of breast: Secondary | ICD-10-CM | POA: Diagnosis not present

## 2015-08-23 DIAGNOSIS — Z510 Encounter for antineoplastic radiation therapy: Secondary | ICD-10-CM | POA: Diagnosis not present

## 2015-08-23 MED ORDER — DOXYCYCLINE HYCLATE 100 MG PO CAPS: 100.0000 mg | ORAL_CAPSULE | Freq: Two times a day (BID) | ORAL | Status: DC

## 2015-08-23 NOTE — Progress Notes (Signed)
Lindsey Barrett has completed 10 fractions to her left breast.  She reports having occasional sharp pains her left breast.  She also reports feeling a lump under her left nipple and is wondering if it is scar tissue.  She denies having fatigue.  The skin on her left breast is pink.  She is using radiaplex.  BP 115/64 mmHg  Pulse 72  Temp(Src) 98.4 F (36.9 C) (Oral)  Ht 5\' 4"  (1.626 m)  Wt 174 lb (78.926 kg)  BMI 29.85 kg/m2

## 2015-08-23 NOTE — Discharge Instructions (Signed)
Apply Neosporin ointment or Polysporin ointment 3 times a day. If the redness surrounding the burn area starts to spread or become streaky start taken the doxycycline. If he gets worse return here for further evaluation. Also return for any fevers. Make an appointment to follow-up with your regular doctor.

## 2015-08-23 NOTE — Progress Notes (Signed)
  Radiation Oncology         [phone] ________________________________  Name: Lindsey Barrett MRN: [id number]  Date: 08/23/2015  DOB: Feb 05, 1946  Weekly Radiation Therapy Management    ICD-9-CM ICD-10-CM   1. 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HCC) 174.2 C50.212      Current Dose: 18 Gy     Planned Dose:  50.4  Narrative . . . . . . . . The patient presents for routine under treatment assessment.                                  Lindsey Barrett has completed 10 fractions to her left breast.  She reports having occasional sharp pains her left breast.  She also reports feeling a lump under her left nipple and is wondering if it is scar tissue.  She denies having fatigue.  The skin on her left breast is pink.  She is using radiaplex.                                 Set-up films were reviewed.                                 The chart was checked. Physical Findings. . .  height is 5\' 4"  (1.626 m) and weight is 174 lb (78.926 kg). Her oral temperature is 98.4 F (36.9 C). Her blood pressure is 115/64 and her pulse is 72. . Weight essentially stable. The lungs are clear. The heart has a regular rhythm and rate. Slight hyperpigmentation changes. Some scar tissue along the surgical bed. Impression . . . . . . . The patient is tolerating radiation. Plan . . . . . . . . . . . . Continue treatment as planned.   ________________________________   Lindsey Promise, PhD, MD  This document serves as a record of services personally performed by Lindsey Pray, MD. It was created on his behalf by Lindsey Barrett, a trained medical scribe. The creation of this record is based on the scribe's personal observations and the provider's statements to them. This document has been checked and approved by the attending provider.
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
## 2015-08-24 ENCOUNTER — Ambulatory Visit
Admission: RE | Admit: 2015-08-24 | Discharge: 2015-08-24 | Disposition: A | Discharge status: Home / Self Care | Payer: Medicare Other | Source: Ambulatory Visit | Attending: Radiation Oncology | Admitting: Radiation Oncology

## 2015-08-24 DIAGNOSIS — Z510 Encounter for antineoplastic radiation therapy: Secondary | ICD-10-CM | POA: Diagnosis not present

## 2015-08-24 DIAGNOSIS — C50212 Malignant neoplasm of upper-inner quadrant of left female breast: Secondary | ICD-10-CM | POA: Diagnosis not present

## 2015-08-24 DIAGNOSIS — Z79899 Other long term (current) drug therapy: Secondary | ICD-10-CM | POA: Diagnosis not present

## 2015-08-24 DIAGNOSIS — Z7982 Long term (current) use of aspirin: Secondary | ICD-10-CM | POA: Diagnosis not present

## 2015-08-24 DIAGNOSIS — Z79811 Long term (current) use of aromatase inhibitors: Secondary | ICD-10-CM | POA: Diagnosis not present

## 2015-08-24 DIAGNOSIS — Z170 Estrogen receptor positive status [ER+]: Secondary | ICD-10-CM | POA: Diagnosis not present

## 2015-08-25 ENCOUNTER — Ambulatory Visit
Admission: RE | Admit: 2015-08-25 | Discharge: 2015-08-25 | Disposition: A | Discharge status: Home / Self Care | Payer: Medicare Other | Source: Ambulatory Visit | Attending: Radiation Oncology | Admitting: Radiation Oncology

## 2015-08-25 DIAGNOSIS — Z79811 Long term (current) use of aromatase inhibitors: Secondary | ICD-10-CM | POA: Diagnosis not present

## 2015-08-25 DIAGNOSIS — C50212 Malignant neoplasm of upper-inner quadrant of left female breast: Secondary | ICD-10-CM | POA: Diagnosis not present

## 2015-08-25 DIAGNOSIS — Z79899 Other long term (current) drug therapy: Secondary | ICD-10-CM | POA: Diagnosis not present

## 2015-08-25 DIAGNOSIS — Z7982 Long term (current) use of aspirin: Secondary | ICD-10-CM | POA: Diagnosis not present

## 2015-08-25 DIAGNOSIS — Z170 Estrogen receptor positive status [ER+]: Secondary | ICD-10-CM | POA: Diagnosis not present

## 2015-08-25 DIAGNOSIS — Z510 Encounter for antineoplastic radiation therapy: Secondary | ICD-10-CM | POA: Diagnosis not present

## 2015-08-26 ENCOUNTER — Ambulatory Visit
Admission: RE | Admit: 2015-08-26 | Discharge: 2015-08-26 | Disposition: A | Discharge status: Home / Self Care | Payer: Medicare Other | Source: Ambulatory Visit | Attending: Radiation Oncology | Admitting: Radiation Oncology

## 2015-08-26 DIAGNOSIS — Z79811 Long term (current) use of aromatase inhibitors: Secondary | ICD-10-CM | POA: Diagnosis not present

## 2015-08-26 DIAGNOSIS — C50212 Malignant neoplasm of upper-inner quadrant of left female breast: Secondary | ICD-10-CM | POA: Diagnosis not present

## 2015-08-26 DIAGNOSIS — Z170 Estrogen receptor positive status [ER+]: Secondary | ICD-10-CM | POA: Diagnosis not present

## 2015-08-26 DIAGNOSIS — Z7982 Long term (current) use of aspirin: Secondary | ICD-10-CM | POA: Diagnosis not present

## 2015-08-26 DIAGNOSIS — Z510 Encounter for antineoplastic radiation therapy: Secondary | ICD-10-CM | POA: Diagnosis not present

## 2015-08-26 DIAGNOSIS — Z79899 Other long term (current) drug therapy: Secondary | ICD-10-CM | POA: Diagnosis not present

## 2015-08-29 ENCOUNTER — Ambulatory Visit
Admission: RE | Admit: 2015-08-29 | Discharge: 2015-08-29 | Disposition: A | Discharge status: Home / Self Care | Payer: Medicare Other | Source: Ambulatory Visit | Attending: Radiation Oncology | Admitting: Radiation Oncology

## 2015-08-29 DIAGNOSIS — Z170 Estrogen receptor positive status [ER+]: Secondary | ICD-10-CM | POA: Diagnosis not present

## 2015-08-29 DIAGNOSIS — Z79811 Long term (current) use of aromatase inhibitors: Secondary | ICD-10-CM | POA: Diagnosis not present

## 2015-08-29 DIAGNOSIS — C50212 Malignant neoplasm of upper-inner quadrant of left female breast: Secondary | ICD-10-CM | POA: Diagnosis not present

## 2015-08-29 DIAGNOSIS — Z7982 Long term (current) use of aspirin: Secondary | ICD-10-CM | POA: Diagnosis not present

## 2015-08-29 DIAGNOSIS — Z510 Encounter for antineoplastic radiation therapy: Secondary | ICD-10-CM | POA: Diagnosis not present

## 2015-08-29 DIAGNOSIS — Z79899 Other long term (current) drug therapy: Secondary | ICD-10-CM | POA: Diagnosis not present

## 2015-08-30 ENCOUNTER — Ambulatory Visit
Admission: RE | Admit: 2015-08-30 | Discharge: 2015-08-30 | Disposition: A | Discharge status: Home / Self Care | Payer: Medicare Other | Source: Ambulatory Visit | Attending: Radiation Oncology | Admitting: Radiation Oncology

## 2015-08-30 ENCOUNTER — Encounter: Payer: Self-pay | Admitting: Radiation Oncology

## 2015-08-30 VITALS — BP 114/59 | HR 74 | Temp 98.8°F | Ht 64.0 in | Wt 173.5 lb

## 2015-08-30 DIAGNOSIS — Z79811 Long term (current) use of aromatase inhibitors: Secondary | ICD-10-CM | POA: Diagnosis not present

## 2015-08-30 DIAGNOSIS — L598 Other specified disorders of the skin and subcutaneous tissue related to radiation: Secondary | ICD-10-CM | POA: Insufficient documentation

## 2015-08-30 DIAGNOSIS — C50212 Malignant neoplasm of upper-inner quadrant of left female breast: Secondary | ICD-10-CM | POA: Diagnosis not present

## 2015-08-30 DIAGNOSIS — Z7982 Long term (current) use of aspirin: Secondary | ICD-10-CM | POA: Diagnosis not present

## 2015-08-30 DIAGNOSIS — Z79899 Other long term (current) drug therapy: Secondary | ICD-10-CM | POA: Diagnosis not present

## 2015-08-30 DIAGNOSIS — Z170 Estrogen receptor positive status [ER+]: Secondary | ICD-10-CM | POA: Diagnosis not present

## 2015-08-30 DIAGNOSIS — Z510 Encounter for antineoplastic radiation therapy: Secondary | ICD-10-CM | POA: Diagnosis not present

## 2015-08-30 MED ORDER — RADIAPLEXRX EX GEL
Freq: Once | CUTANEOUS | Status: AC
  Administered 2015-08-30: 17:00:00 via TOPICAL

## 2015-08-30 NOTE — Progress Notes (Signed)
Lindsey Barrett has completed 15 fractions to her left breast.  She reports having occasional sharp pains in her left breast.  She also reports having a sharp pain in her left lower leg last night.  She has noticed that she is urinating more and having bowel movements more often since starting radiation.  She is using radiaplex and has been provided with a refill.  The skin on her left breast is red with a slight red rash.  She has been using hydrocortisone cream.  BP 114/59 mmHg  Pulse 74  Temp(Src) 98.8 F (37.1 C) (Oral)  Ht 5\' 4"  (1.626 m)  Wt 173 lb 8 oz (78.699 kg)  BMI 29.77 kg/m2
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
## 2015-08-31 ENCOUNTER — Ambulatory Visit
Admission: RE | Admit: 2015-08-31 | Discharge: 2015-08-31 | Disposition: A | Discharge status: Home / Self Care | Payer: Medicare Other | Source: Ambulatory Visit | Attending: Radiation Oncology | Admitting: Radiation Oncology

## 2015-08-31 DIAGNOSIS — Z510 Encounter for antineoplastic radiation therapy: Secondary | ICD-10-CM | POA: Diagnosis not present

## 2015-08-31 DIAGNOSIS — C50212 Malignant neoplasm of upper-inner quadrant of left female breast: Secondary | ICD-10-CM | POA: Diagnosis not present

## 2015-08-31 DIAGNOSIS — Z79899 Other long term (current) drug therapy: Secondary | ICD-10-CM | POA: Diagnosis not present

## 2015-08-31 DIAGNOSIS — Z7982 Long term (current) use of aspirin: Secondary | ICD-10-CM | POA: Diagnosis not present

## 2015-08-31 DIAGNOSIS — Z170 Estrogen receptor positive status [ER+]: Secondary | ICD-10-CM | POA: Diagnosis not present

## 2015-08-31 DIAGNOSIS — Z79811 Long term (current) use of aromatase inhibitors: Secondary | ICD-10-CM | POA: Diagnosis not present

## 2015-09-01 ENCOUNTER — Ambulatory Visit
Admission: RE | Admit: 2015-09-01 | Discharge: 2015-09-01 | Disposition: A | Discharge status: Home / Self Care | Payer: Medicare Other | Source: Ambulatory Visit | Attending: Radiation Oncology | Admitting: Radiation Oncology

## 2015-09-01 DIAGNOSIS — C50212 Malignant neoplasm of upper-inner quadrant of left female breast: Secondary | ICD-10-CM | POA: Diagnosis not present

## 2015-09-01 DIAGNOSIS — Z510 Encounter for antineoplastic radiation therapy: Secondary | ICD-10-CM | POA: Diagnosis not present

## 2015-09-01 DIAGNOSIS — Z79811 Long term (current) use of aromatase inhibitors: Secondary | ICD-10-CM | POA: Diagnosis not present

## 2015-09-01 DIAGNOSIS — Z79899 Other long term (current) drug therapy: Secondary | ICD-10-CM | POA: Diagnosis not present

## 2015-09-01 DIAGNOSIS — Z170 Estrogen receptor positive status [ER+]: Secondary | ICD-10-CM | POA: Diagnosis not present

## 2015-09-01 DIAGNOSIS — Z7982 Long term (current) use of aspirin: Secondary | ICD-10-CM | POA: Diagnosis not present

## 2015-09-02 ENCOUNTER — Ambulatory Visit
Admission: RE | Admit: 2015-09-02 | Discharge: 2015-09-02 | Disposition: A | Discharge status: Home / Self Care | Payer: Medicare Other | Source: Ambulatory Visit | Attending: Radiation Oncology | Admitting: Radiation Oncology

## 2015-09-02 DIAGNOSIS — Z79899 Other long term (current) drug therapy: Secondary | ICD-10-CM | POA: Diagnosis not present

## 2015-09-02 DIAGNOSIS — Z170 Estrogen receptor positive status [ER+]: Secondary | ICD-10-CM | POA: Diagnosis not present

## 2015-09-02 DIAGNOSIS — C50212 Malignant neoplasm of upper-inner quadrant of left female breast: Secondary | ICD-10-CM | POA: Diagnosis not present

## 2015-09-02 DIAGNOSIS — Z510 Encounter for antineoplastic radiation therapy: Secondary | ICD-10-CM | POA: Diagnosis not present

## 2015-09-02 DIAGNOSIS — Z79811 Long term (current) use of aromatase inhibitors: Secondary | ICD-10-CM | POA: Diagnosis not present

## 2015-09-02 DIAGNOSIS — Z7982 Long term (current) use of aspirin: Secondary | ICD-10-CM | POA: Diagnosis not present

## 2015-09-05 ENCOUNTER — Ambulatory Visit
Admission: RE | Admit: 2015-09-05 | Discharge: 2015-09-05 | Disposition: A | Discharge status: Home / Self Care | Payer: Medicare Other | Source: Ambulatory Visit | Attending: Radiation Oncology | Admitting: Radiation Oncology

## 2015-09-05 DIAGNOSIS — Z510 Encounter for antineoplastic radiation therapy: Secondary | ICD-10-CM | POA: Diagnosis not present

## 2015-09-05 DIAGNOSIS — Z79899 Other long term (current) drug therapy: Secondary | ICD-10-CM | POA: Diagnosis not present

## 2015-09-05 DIAGNOSIS — Z7982 Long term (current) use of aspirin: Secondary | ICD-10-CM | POA: Diagnosis not present

## 2015-09-05 DIAGNOSIS — Z170 Estrogen receptor positive status [ER+]: Secondary | ICD-10-CM | POA: Diagnosis not present

## 2015-09-05 DIAGNOSIS — Z79811 Long term (current) use of aromatase inhibitors: Secondary | ICD-10-CM | POA: Diagnosis not present

## 2015-09-05 DIAGNOSIS — C50212 Malignant neoplasm of upper-inner quadrant of left female breast: Secondary | ICD-10-CM | POA: Diagnosis not present

## 2015-09-06 ENCOUNTER — Encounter: Payer: Self-pay | Admitting: Radiation Oncology

## 2015-09-06 ENCOUNTER — Ambulatory Visit
Admission: RE | Admit: 2015-09-06 | Discharge: 2015-09-06 | Disposition: A | Discharge status: Home / Self Care | Payer: Medicare Other | Source: Ambulatory Visit | Attending: Radiation Oncology | Admitting: Radiation Oncology

## 2015-09-06 VITALS — BP 111/65 | HR 79 | Temp 98.2°F | Ht 64.0 in | Wt 173.0 lb

## 2015-09-06 DIAGNOSIS — Z79811 Long term (current) use of aromatase inhibitors: Secondary | ICD-10-CM | POA: Diagnosis not present

## 2015-09-06 DIAGNOSIS — Z7982 Long term (current) use of aspirin: Secondary | ICD-10-CM | POA: Diagnosis not present

## 2015-09-06 DIAGNOSIS — C50212 Malignant neoplasm of upper-inner quadrant of left female breast: Secondary | ICD-10-CM

## 2015-09-06 DIAGNOSIS — Z79899 Other long term (current) drug therapy: Secondary | ICD-10-CM | POA: Diagnosis not present

## 2015-09-06 DIAGNOSIS — Z170 Estrogen receptor positive status [ER+]: Secondary | ICD-10-CM | POA: Diagnosis not present

## 2015-09-06 DIAGNOSIS — Z510 Encounter for antineoplastic radiation therapy: Secondary | ICD-10-CM | POA: Diagnosis not present

## 2015-09-06 NOTE — Progress Notes (Signed)
Lindsey Barrett presents for her 20th fraction of radiation to her Left Breast. She denies pain. She denies fatigue. Her breast is red, swollen, and tender. She has a rash to her breast which she using hydrocortizone cream. She is using the radiaplex cream to her entire breast as directed. She does have occasional sharp pains in her breast. She had the worse one yesterday during treatment, and was startled and concerned by it.   BP 111/65 mmHg  Pulse 79  Temp(Src) 98.2 F (36.8 C)  Ht 5\' 4"  (1.626 m)  Wt 173 lb (78.472 kg)  BMI 29.68 kg/m2  SpO2 98%

## 2015-09-06 NOTE — Progress Notes (Signed)
  Radiation Oncology         [phone]) [phone] ________________________________  Name: Lindsey Barrett MRN: [id number]  Date: 09/06/2015  DOB: 1945-11-06  Weekly Radiation Therapy Management    ICD-9-CM ICD-10-CM   1. 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HCC) 174.2 C50.212      Current Dose: 36 Gy     Planned Dose:  50.4  Narrative . . . . . . . . The patient presents for routine under treatment assessment.                            Lindsey Barrett presents for her 20th fraction of radiation to her Left Breast. She denies fatigue. The nurse notes that the patient's breast is red, swollen, and tender. She has a rash to her breast and intense pruritus for which she using hydrocortizone cream. She is using the radiaplex cream to her entire breast as directed. She does have occasional sharp pains in her breast. She had the worse one yesterday during treatment and was startled and concerned by it. She has not noticed any fatigue as of yet.                                 Set-up films were reviewed.                                 The chart was checked. Physical Findings. . .  height is 5\' 4"  (1.626 m) and weight is 173 lb (78.472 kg). Her temperature is 98.2 F (36.8 C). Her blood pressure is 111/65 and her pulse is 79. Her oxygen saturation is 98%. . Weight essentially stable. The lungs are clear. The heart has a regular rhythm and rate. Some erythema throughout the left breast, most prominent in the upper inner aspect of the breast. Impression . . . . . . . The patient is tolerating radiation. Plan . . . . . . . . . . . . Continue treatment as planned.  ________________________________   Blair Promise, PhD, MD  This document serves as a record of services personally performed by Gery Pray, MD. It was created on his behalf by Darcus Austin, a trained medical scribe. The creation of this record is based on the scribe's personal observations and the provider's statements  to them. This document has been checked and approved by the attending provider.

## 2015-09-07 ENCOUNTER — Ambulatory Visit
Admission: RE | Admit: 2015-09-07 | Discharge: 2015-09-07 | Disposition: A | Discharge status: Home / Self Care | Payer: Medicare Other | Source: Ambulatory Visit | Attending: Radiation Oncology | Admitting: Radiation Oncology

## 2015-09-07 DIAGNOSIS — Z7982 Long term (current) use of aspirin: Secondary | ICD-10-CM | POA: Diagnosis not present

## 2015-09-07 DIAGNOSIS — Z79811 Long term (current) use of aromatase inhibitors: Secondary | ICD-10-CM | POA: Diagnosis not present

## 2015-09-07 DIAGNOSIS — Z170 Estrogen receptor positive status [ER+]: Secondary | ICD-10-CM | POA: Diagnosis not present

## 2015-09-07 DIAGNOSIS — Z79899 Other long term (current) drug therapy: Secondary | ICD-10-CM | POA: Diagnosis not present

## 2015-09-07 DIAGNOSIS — C50212 Malignant neoplasm of upper-inner quadrant of left female breast: Secondary | ICD-10-CM | POA: Diagnosis not present

## 2015-09-07 DIAGNOSIS — Z510 Encounter for antineoplastic radiation therapy: Secondary | ICD-10-CM | POA: Diagnosis not present

## 2015-09-08 ENCOUNTER — Ambulatory Visit
Admission: RE | Admit: 2015-09-08 | Discharge: 2015-09-08 | Disposition: A | Discharge status: Home / Self Care | Payer: Medicare Other | Source: Ambulatory Visit | Attending: Radiation Oncology | Admitting: Radiation Oncology

## 2015-09-08 DIAGNOSIS — Z7982 Long term (current) use of aspirin: Secondary | ICD-10-CM | POA: Diagnosis not present

## 2015-09-08 DIAGNOSIS — Z510 Encounter for antineoplastic radiation therapy: Secondary | ICD-10-CM | POA: Diagnosis not present

## 2015-09-08 DIAGNOSIS — Z170 Estrogen receptor positive status [ER+]: Secondary | ICD-10-CM | POA: Diagnosis not present

## 2015-09-08 DIAGNOSIS — C50212 Malignant neoplasm of upper-inner quadrant of left female breast: Secondary | ICD-10-CM | POA: Diagnosis not present

## 2015-09-08 DIAGNOSIS — Z79899 Other long term (current) drug therapy: Secondary | ICD-10-CM | POA: Diagnosis not present

## 2015-09-08 DIAGNOSIS — Z79811 Long term (current) use of aromatase inhibitors: Secondary | ICD-10-CM | POA: Diagnosis not present

## 2015-09-09 ENCOUNTER — Ambulatory Visit
Admission: RE | Admit: 2015-09-09 | Discharge: 2015-09-09 | Disposition: A | Discharge status: Home / Self Care | Payer: Medicare Other | Source: Ambulatory Visit | Attending: Radiation Oncology | Admitting: Radiation Oncology

## 2015-09-09 DIAGNOSIS — Z79899 Other long term (current) drug therapy: Secondary | ICD-10-CM | POA: Diagnosis not present

## 2015-09-09 DIAGNOSIS — Z510 Encounter for antineoplastic radiation therapy: Secondary | ICD-10-CM | POA: Diagnosis not present

## 2015-09-09 DIAGNOSIS — C50212 Malignant neoplasm of upper-inner quadrant of left female breast: Secondary | ICD-10-CM | POA: Diagnosis not present

## 2015-09-09 DIAGNOSIS — Z170 Estrogen receptor positive status [ER+]: Secondary | ICD-10-CM | POA: Diagnosis not present

## 2015-09-09 DIAGNOSIS — Z79811 Long term (current) use of aromatase inhibitors: Secondary | ICD-10-CM | POA: Diagnosis not present

## 2015-09-09 DIAGNOSIS — Z7982 Long term (current) use of aspirin: Secondary | ICD-10-CM | POA: Diagnosis not present

## 2015-09-12 ENCOUNTER — Ambulatory Visit
Admission: RE | Admit: 2015-09-12 | Discharge: 2015-09-12 | Disposition: A | Discharge status: Home / Self Care | Payer: Medicare Other | Source: Ambulatory Visit | Attending: Radiation Oncology | Admitting: Radiation Oncology

## 2015-09-12 DIAGNOSIS — Z170 Estrogen receptor positive status [ER+]: Secondary | ICD-10-CM | POA: Diagnosis not present

## 2015-09-12 DIAGNOSIS — Z7982 Long term (current) use of aspirin: Secondary | ICD-10-CM | POA: Diagnosis not present

## 2015-09-12 DIAGNOSIS — Z79899 Other long term (current) drug therapy: Secondary | ICD-10-CM | POA: Diagnosis not present

## 2015-09-12 DIAGNOSIS — Z79811 Long term (current) use of aromatase inhibitors: Secondary | ICD-10-CM | POA: Diagnosis not present

## 2015-09-12 DIAGNOSIS — C50212 Malignant neoplasm of upper-inner quadrant of left female breast: Secondary | ICD-10-CM | POA: Diagnosis not present

## 2015-09-12 DIAGNOSIS — Z510 Encounter for antineoplastic radiation therapy: Secondary | ICD-10-CM | POA: Diagnosis not present

## 2015-09-13 ENCOUNTER — Ambulatory Visit
Admission: RE | Admit: 2015-09-13 | Discharge: 2015-09-13 | Disposition: A | Discharge status: Home / Self Care | Payer: Medicare Other | Source: Ambulatory Visit | Attending: Radiation Oncology | Admitting: Radiation Oncology

## 2015-09-13 ENCOUNTER — Encounter: Payer: Self-pay | Admitting: Radiation Oncology

## 2015-09-13 VITALS — BP 127/71 | HR 69 | Temp 98.3°F | Ht 64.0 in | Wt 173.2 lb

## 2015-09-13 DIAGNOSIS — C50212 Malignant neoplasm of upper-inner quadrant of left female breast: Secondary | ICD-10-CM | POA: Diagnosis not present

## 2015-09-13 DIAGNOSIS — Z79811 Long term (current) use of aromatase inhibitors: Secondary | ICD-10-CM | POA: Diagnosis not present

## 2015-09-13 DIAGNOSIS — Z79899 Other long term (current) drug therapy: Secondary | ICD-10-CM | POA: Diagnosis not present

## 2015-09-13 DIAGNOSIS — Z7982 Long term (current) use of aspirin: Secondary | ICD-10-CM | POA: Diagnosis not present

## 2015-09-13 DIAGNOSIS — Z510 Encounter for antineoplastic radiation therapy: Secondary | ICD-10-CM | POA: Diagnosis not present

## 2015-09-13 DIAGNOSIS — Z170 Estrogen receptor positive status [ER+]: Secondary | ICD-10-CM | POA: Diagnosis not present

## 2015-09-13 NOTE — Progress Notes (Addendum)
Weekly Management Note Current Dose:  45 Gy  Projected Dose: 50.4 Gy   Narrative:  The patient presents for routine under treatment assessment.  CBCT/MVCT images/Port film x-rays were reviewed.  The chart was checked. Teasa Jess has completed 25 fractions to her left breast. She reports having some tenderness/swelling on the side of her left breast. She also reports that her left breast is more dense. She denies having an increase in fatigue. She is using radiaplex and hydrocortisone on her nipple area for itching. She has been given a Survivorship and [facility] handout. The skin on her left breast is red.  Physical Findings: Weight: 173 lb 3.2 oz (78.563 kg). Pink left breast skin.   Impression:  The patient is tolerating radiation.   Plan:  Continue treatment as planned. She finishes treatment 09/16/2015. She has a one month follow up appointment with Dr. Sondra Come. She will have a Survivorship appointment scheduled. She has an appointment with Dr. Lindi Adie 11/10/2015.    ------------------------------------------------  Thea Silversmith, MD    This document serves as a record of services personally performed by Thea Silversmith, MD. It was created on her behalf by  Lendon Collar, a trained medical scribe. The creation of this record is based on the scribe's personal observations and the provider's statements to them. This document has been checked and approved by the attending provider.

## 2015-09-13 NOTE — Progress Notes (Signed)
Lindsey Barrett has completed 25 fractions to her left breast.  She reports having some tenderness/swelling on the side of her left breast.  She also reports that her left breast is more dense.  She denies having an increase in fatigue.  She is using radiaplex and hydrocortisone on her nipple area for itching.  She has been given a Survivorship and [facility] handout.  The skin on her left breast is red.  BP 127/71 mmHg  Pulse 69  Temp(Src) 98.3 F (36.8 C) (Oral)  Ht 5\' 4"  (1.626 m)  Wt 173 lb 3.2 oz (78.563 kg)  BMI 29.72 kg/m2

## 2015-09-14 ENCOUNTER — Ambulatory Visit
Admission: RE | Admit: 2015-09-14 | Discharge: 2015-09-14 | Disposition: A | Discharge status: Home / Self Care | Payer: Medicare Other | Source: Ambulatory Visit | Attending: Radiation Oncology | Admitting: Radiation Oncology

## 2015-09-14 DIAGNOSIS — Z510 Encounter for antineoplastic radiation therapy: Secondary | ICD-10-CM | POA: Diagnosis not present

## 2015-09-14 DIAGNOSIS — Z7982 Long term (current) use of aspirin: Secondary | ICD-10-CM | POA: Diagnosis not present

## 2015-09-14 DIAGNOSIS — Z79811 Long term (current) use of aromatase inhibitors: Secondary | ICD-10-CM | POA: Diagnosis not present

## 2015-09-14 DIAGNOSIS — Z170 Estrogen receptor positive status [ER+]: Secondary | ICD-10-CM | POA: Diagnosis not present

## 2015-09-14 DIAGNOSIS — C50212 Malignant neoplasm of upper-inner quadrant of left female breast: Secondary | ICD-10-CM | POA: Diagnosis not present

## 2015-09-14 DIAGNOSIS — Z79899 Other long term (current) drug therapy: Secondary | ICD-10-CM | POA: Diagnosis not present

## 2015-09-15 ENCOUNTER — Ambulatory Visit
Admission: RE | Admit: 2015-09-15 | Discharge: 2015-09-15 | Disposition: A | Discharge status: Home / Self Care | Payer: Medicare Other | Source: Ambulatory Visit | Attending: Radiation Oncology | Admitting: Radiation Oncology

## 2015-09-15 DIAGNOSIS — C50212 Malignant neoplasm of upper-inner quadrant of left female breast: Secondary | ICD-10-CM | POA: Diagnosis not present

## 2015-09-15 DIAGNOSIS — Z170 Estrogen receptor positive status [ER+]: Secondary | ICD-10-CM | POA: Diagnosis not present

## 2015-09-15 DIAGNOSIS — Z79811 Long term (current) use of aromatase inhibitors: Secondary | ICD-10-CM | POA: Diagnosis not present

## 2015-09-15 DIAGNOSIS — Z7982 Long term (current) use of aspirin: Secondary | ICD-10-CM | POA: Diagnosis not present

## 2015-09-15 DIAGNOSIS — Z79899 Other long term (current) drug therapy: Secondary | ICD-10-CM | POA: Diagnosis not present

## 2015-09-15 DIAGNOSIS — Z510 Encounter for antineoplastic radiation therapy: Secondary | ICD-10-CM | POA: Diagnosis not present

## 2015-09-16 ENCOUNTER — Ambulatory Visit: Payer: Medicare Other

## 2015-09-16 ENCOUNTER — Encounter: Payer: Self-pay | Admitting: Radiation Oncology

## 2015-09-16 ENCOUNTER — Ambulatory Visit
Admission: RE | Admit: 2015-09-16 | Discharge: 2015-09-16 | Disposition: A | Discharge status: Home / Self Care | Payer: Medicare Other | Source: Ambulatory Visit | Attending: Radiation Oncology | Admitting: Radiation Oncology

## 2015-09-16 VITALS — BP 111/61 | HR 80 | Temp 98.1°F | Ht 64.0 in | Wt 172.6 lb

## 2015-09-16 DIAGNOSIS — Z79899 Other long term (current) drug therapy: Secondary | ICD-10-CM | POA: Diagnosis not present

## 2015-09-16 DIAGNOSIS — Z7982 Long term (current) use of aspirin: Secondary | ICD-10-CM | POA: Diagnosis not present

## 2015-09-16 DIAGNOSIS — C50212 Malignant neoplasm of upper-inner quadrant of left female breast: Secondary | ICD-10-CM

## 2015-09-16 DIAGNOSIS — Z170 Estrogen receptor positive status [ER+]: Secondary | ICD-10-CM | POA: Diagnosis not present

## 2015-09-16 DIAGNOSIS — Z79811 Long term (current) use of aromatase inhibitors: Secondary | ICD-10-CM | POA: Diagnosis not present

## 2015-09-16 DIAGNOSIS — Z510 Encounter for antineoplastic radiation therapy: Secondary | ICD-10-CM | POA: Diagnosis not present

## 2015-09-16 NOTE — Progress Notes (Signed)
  Radiation Oncology         [phone] ________________________________  Name: Lindsey Barrett MRN: [id number]  Date: 09/16/2015  DOB: December 04, 1945  Weekly Radiation Therapy Management    ICD-9-CM ICD-10-CM   1. 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HCC) 174.2 C50.212      Current Dose: 50.4 Gy     Planned Dose:  50.4  Narrative . . . . . . . . The patient presents for routine under treatment assessment.                           -Lindsey Barrett is here for her final treatment of radiation to her Left Breast. She denies pain or fatigue. Her Left Breast is red, swollen, and slightly tender. She is using the radiaplex cream as directed and an extra tube to use at home. She has a one month follow up appointment with Dr. Sondra Come. She states she is doing well and is staying active.                                 Set-up films were reviewed.                               The chart was checked. Physical Findings. . .  height is 5\' 4"  (1.626 m) and weight is 172 lb 9.6 oz (78.291 kg). Her temperature is 98.1 F (36.7 C). Her blood pressure is 111/61 and her pulse is 80. . Weight essentially stable. The lungs are clear. The heart has a regular rhythm and rate. Some erythema throughout the left breast, most prominent in the upper inner aspect of the breast.   Impression . . . . . . . The patient is tolerating radiation.  Plan . . . . . . . . . . . . The patient has succesfully completed radiation treatment today. 1 month follow up with Dr.Kinard.   ------------------------------------------------   Tyler Pita, MD  This document serves as a record of services personally performed by Tyler Pita, MD. It was created on his behalf by Derek Mound, a trained medical scribe. The creation of this record is based on the scribe's personal observations and the provider's statements to them. This document has been checked and approved by the attending provider.

## 2015-09-16 NOTE — Progress Notes (Signed)
Lindsey Barrett is here for her final treatment of radiation to her Left Breast. She denies pain or fatigue. Her Left Breast is red, swollen, and slightly tender. She is using the radiaplex cream as directed and an extra tube to use at home. She has a one month follow up appointment with Dr. Sondra Come.  BP 111/61 mmHg  Pulse 80  Temp(Src) 98.1 F (36.7 C)  Ht 5\' 4"  (1.626 m)  Wt 172 lb 9.6 oz (78.291 kg)  BMI 29.61 kg/m2   Wt Readings from Last 3 Encounters:  09/16/15 172 lb 9.6 oz (78.291 kg)  09/13/15 173 lb 3.2 oz (78.563 kg)  09/06/15 173 lb (78.472 kg)

## 2015-09-17 ENCOUNTER — Ambulatory Visit: Payer: Medicare Other

## 2015-09-19 ENCOUNTER — Telehealth: Payer: Self-pay | Admitting: Hematology and Oncology

## 2015-09-19 ENCOUNTER — Other Ambulatory Visit: Payer: Self-pay | Admitting: Adult Health

## 2015-09-19 ENCOUNTER — Ambulatory Visit: Payer: Medicare Other

## 2015-09-19 DIAGNOSIS — C50212 Malignant neoplasm of upper-inner quadrant of left female breast: Secondary | ICD-10-CM

## 2015-09-19 NOTE — Telephone Encounter (Signed)
Spoke w/ pt confirmed aug apt

## 2015-09-20 ENCOUNTER — Ambulatory Visit: Payer: Medicare Other

## 2015-09-21 ENCOUNTER — Ambulatory Visit: Payer: Medicare Other

## 2015-09-22 ENCOUNTER — Ambulatory Visit: Payer: Medicare Other

## 2015-09-23 ENCOUNTER — Ambulatory Visit: Payer: Medicare Other

## 2015-09-23 ENCOUNTER — Telehealth: Payer: Self-pay | Admitting: *Deleted

## 2015-09-23 ENCOUNTER — Telehealth: Payer: Self-pay | Admitting: Oncology

## 2015-09-23 NOTE — Telephone Encounter (Signed)
  Oncology Nurse Navigator Documentation    Navigator Encounter Type: Telephone (09/23/15 0900) Telephone: Lindsey Barrett Call (09/23/15 0900)         Patient Visit Type: RadOnc (09/23/15 0900) Treatment Phase: Final Radiation Tx (09/23/15 0900)   Interventions: Referrals (09/23/15 0900) Referrals: Survivorship (09/23/15 0900)  Pt c/o "swelling on the breast and under the are where they took that lymph node". Discussed with pt that is likely from the radiation and that I will call and get her in to see Dr. Sondra Come. Pt wishes for appt next week. I called Dr. Clabe Seal nurse Santiago Glad and gave complaint. Santiago Glad will call pt with an appt to see Dr. Sondra Come for next week.         Acuity: Level 1 (09/23/15 0900)         Time Spent with Patient: 15 (09/23/15 0900)

## 2015-09-23 NOTE — Telephone Encounter (Signed)
Lindsey Barrett called back and said she has had swelling, fullness and tenderness in her left breast since last Thursday.  She denies having a fever. She would like to see Dr. Sondra Come next week at 4:00 pm on Tuesday due to her work schedule.  Advised her to call if she has a temperature.  Lindsey Barrett verbalized agreement and understanding.

## 2015-09-23 NOTE — Telephone Encounter (Signed)
Left a message for Liz. Requested a return call.

## 2015-09-26 ENCOUNTER — Encounter: Payer: Self-pay | Admitting: Oncology

## 2015-09-26 ENCOUNTER — Ambulatory Visit: Payer: Self-pay | Admitting: Radiation Oncology

## 2015-09-27 ENCOUNTER — Ambulatory Visit
Admission: RE | Admit: 2015-09-27 | Discharge: 2015-09-27 | Disposition: A | Discharge status: Home / Self Care | Payer: Medicare Other | Source: Ambulatory Visit | Attending: Radiation Oncology | Admitting: Radiation Oncology

## 2015-09-27 ENCOUNTER — Encounter: Payer: Self-pay | Admitting: Radiation Oncology

## 2015-09-27 VITALS — BP 123/70 | HR 77 | Temp 98.1°F | Ht 64.0 in | Wt 173.3 lb

## 2015-09-27 DIAGNOSIS — Z888 Allergy status to other drugs, medicaments and biological substances status: Secondary | ICD-10-CM | POA: Insufficient documentation

## 2015-09-27 DIAGNOSIS — Z79899 Other long term (current) drug therapy: Secondary | ICD-10-CM | POA: Diagnosis not present

## 2015-09-27 DIAGNOSIS — C50212 Malignant neoplasm of upper-inner quadrant of left female breast: Secondary | ICD-10-CM | POA: Diagnosis not present

## 2015-09-27 DIAGNOSIS — Z923 Personal history of irradiation: Secondary | ICD-10-CM | POA: Insufficient documentation

## 2015-09-27 DIAGNOSIS — Z7982 Long term (current) use of aspirin: Secondary | ICD-10-CM | POA: Insufficient documentation

## 2015-09-27 NOTE — Progress Notes (Signed)
Radiation Oncology         [phone] ________________________________  Name: Lindsey Barrett MRN: [id number]  Date: 09/27/2015  DOB: Sep 07, 1945  Follow-Up Visit Note  CC: Lindsey Rail, MD  Lindsey Bookbinder, MD    ICD-9-CM ICD-10-CM   1. 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 She also reports having pain and swelling under her left arm. She said the swelling started on 09/15/15 and has gone down a little bit since Friday. She is also concerned about a mole that has changed shape on her left breast. She will start arimidex on June 1.                        ALLERGIES:  is allergic to septra ds and adhesive.  Meds: Current Outpatient Prescriptions  Medication Sig Dispense Refill  . aspirin 81 MG tablet Take 81 mg by mouth as directed.     . bimatoprost (LUMIGAN) 0.01 % SOLN Place 1 drop into both eyes at bedtime.    . brinzolamide (AZOPT) 1 % ophthalmic suspension 1 drop 2 (two) times daily.    . calcium carbonate (TUMS - DOSED IN MG ELEMENTAL CALCIUM) 500 MG chewable tablet Chew 1 tablet by mouth as needed for indigestion or heartburn.    . Ergocalciferol (VITAMIN D2) 2000 UNITS TABS Take 1 tablet by mouth daily.    . hyaluronate sodium (RADIAPLEXRX) GEL Apply 1 application topically 2 (two) times daily.    [name] ibuprofen (ADVIL,MOTRIN) 200 MG tablet Take 200 mg by mouth every 6 (six) hours as needed.    . ranitidine (ZANTAC) 150 MG tablet Take 1 tablet (150 mg total) by mouth 2 (two) times daily. 60 tablet 0  . rosuvastatin (CRESTOR) 40 MG tablet Take 1 tablet (40 mg total) by mouth daily. 90 tablet 3  . anastrozole  (ARIMIDEX) 1 MG tablet Take 1 tablet (1 mg total) by mouth daily. (Patient not taking: Reported on 07/13/2015) 90 tablet 3  . HYDROcodone-acetaminophen (NORCO) 10-325 MG tablet Take 1 tablet by mouth every 6 (six) hours as needed. (Patient not taking: Reported on 09/27/2015) 20 tablet 0  . [DISCONTINUED] metoprolol tartrate (LOPRESSOR) 25 MG tablet Take 1 tablet (25 mg total) by mouth as directed. 1 pill every 8 hours as needed for tachycardia or palpitations. 30 tablet 2   No current facility-administered medications for this encounter.    Physical Findings: The patient is in no acute distress. Patient is alert and oriented.  height is 5\' 4"  (1.626 m) and weight is 173 lb 4.8 oz (78.608 kg). Her oral temperature is 98.1 F (36.7 C). Her blood pressure is 123/70 and her pulse is 77. .  Lungs are clear to auscultation bilaterally. Heart has regular rate and rhythm. No palpable cervical, supraclavicular, or axillary adenopathy. Abdomen soft, non-tender, normal bowel sounds. The left breast area shows edema but no signs of infection. Patient has a small mole in the medial aspect of breast which is non-concerning and likely showed hyperpigmentation during her radiation treatment. No palpable cords within the left upper arm. No significant swelling noted.  Lab Findings: Lab Results  Component Value Date   WBC 6.0 08/10/2015   HGB 13.8 08/10/2015   HCT 41.4 08/10/2015   MCV 86.1 08/10/2015   PLT 224.0 08/10/2015    Radiographic Findings: No results found.  Impression:  The patient is recovering from the effects of radiation.  No signs of infection within the breast or recurrence  Plan:  Patient will follow-up in late June  ____________________________________ Gery Pray, MD

## 2015-09-27 NOTE — Progress Notes (Signed)
Lindsey Barrett here for follow up.  She reports having more swelling and tenderness in her left breast.  She also reports having pain and swelling under her left arm.  She said the swelling started on 09/15/15 and has gone down a little bit since Friday.  She is also concerned about a mole that has changed shape on her left breast.  She will start Tamoxifen on June 1. The skin on her left breast is red with a slight rash.  She has a small amount of peeling under her left breast.  She is using radiaplex and hydrocortisone.  BP 123/70 mmHg  Pulse 77  Temp(Src) 98.1 F (36.7 C) (Oral)  Ht 5\' 4"  (1.626 m)  Wt 173 lb 4.8 oz (78.608 kg)  BMI 29.73 kg/m2

## 2015-09-29 ENCOUNTER — Ambulatory Visit: Payer: Self-pay | Admitting: Radiation Oncology

## 2015-09-30 NOTE — Addendum Note (Signed)
Encounter addended by: Jacqulyn Liner, RN on: 09/30/2015  7:32 AM<BR>     Documentation filed: Charges VN

## 2015-10-11 NOTE — Progress Notes (Signed)
  Radiation Oncology         [phone] ________________________________  Name: JUWANA RADZIEWICZ MRN: [id number]  Date: 09/16/2015  DOB: [birth year]  End of Treatment Note  ICD-9-CM ICD-10-CM     1. 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  Some erythema and hyperpigmentation changes. Some itching. No moist desquamation  Plan: The patient has completed radiation treatment. The patient will return to radiation oncology clinic for routine followup in one month. I advised them to call or return sooner if they have any questions or concerns related to their recovery or treatment.  -----------------------------------  Blair Promise, PhD, MD  This document serves as a record of services personally performed by Gery Pray, MD. It was created on his behalf by Darcus Austin, a trained medical scribe. The creation of this record is based on the scribe's personal observations and the provider's statements to them. This document has been checked and approved by the attending provider.

## 2015-10-17 ENCOUNTER — Telehealth: Payer: Self-pay | Admitting: *Deleted

## 2015-10-17 NOTE — Telephone Encounter (Signed)
Pt called with c/o nausea, diarrhea and feeling "fuzzy" after taking anastrozole. Instructed pt to stop taking anastrozole. Moved appt to 11/01/15 to discuss symptoms and starting a new AI. Received verbal understanding. Dr. Lindi Adie notified.

## 2015-10-27 ENCOUNTER — Ambulatory Visit: Payer: Self-pay | Admitting: Radiation Oncology

## 2015-10-27 ENCOUNTER — Encounter: Payer: Self-pay | Admitting: Radiation Oncology

## 2015-10-27 ENCOUNTER — Ambulatory Visit
Admission: RE | Admit: 2015-10-27 | Discharge: 2015-10-27 | Disposition: A | Discharge status: Home / Self Care | Payer: Medicare Other | Source: Ambulatory Visit | Attending: Radiation Oncology | Admitting: Radiation Oncology

## 2015-10-27 VITALS — BP 119/58 | HR 75 | Temp 98.1°F | Resp 16 | Ht 64.0 in | Wt 172.0 lb

## 2015-10-27 DIAGNOSIS — Z923 Personal history of irradiation: Secondary | ICD-10-CM | POA: Insufficient documentation

## 2015-10-27 DIAGNOSIS — C50212 Malignant neoplasm of upper-inner quadrant of left female breast: Secondary | ICD-10-CM | POA: Insufficient documentation

## 2015-10-27 DIAGNOSIS — Z7982 Long term (current) use of aspirin: Secondary | ICD-10-CM | POA: Insufficient documentation

## 2015-10-27 DIAGNOSIS — Z79899 Other long term (current) drug therapy: Secondary | ICD-10-CM | POA: Diagnosis not present

## 2015-10-27 DIAGNOSIS — Z888 Allergy status to other drugs, medicaments and biological substances status: Secondary | ICD-10-CM | POA: Insufficient documentation

## 2015-10-27 NOTE — Progress Notes (Signed)
Ms. Pita Macfadyen is here for a one month  follow up visit for breast cancer upper -inner left quadrant female breast cancer.  Skin status:Left  Breast with normal color. Lotion being used: Plans to useUtterly smooth to left breast. Have you seen your medical oncologist? Date If not ,when is appointment 11-01-15 When is yiur next mammogram? Has it been scheduled ? : Not scheduled ER+,have started AI or Tamoxifen? If not, why? Anastrozole started had to stop caused sides effects ie dizziness,disorientation, unable to focus,change in short term memory, nausea and diarrhea x 4 days Discuss survivorship appointment:12-15-15     [facility] Offer referral reading material for Survivorship, [facility] and [facility] given  Will be given day of appointment Appetite:Good Pain:None Arm mobility:Able to raise left arm without difficulty. Fatigue:No, had fatigue with the Anastrozole BP 119/58 mmHg  Pulse 75  Temp(Src) 98.1 F (36.7 C) (Oral)  Resp 16  Ht 5\' 4"  (1.626 m)  Wt 172 lb (78.019 kg)  BMI 29.51 kg/m2  SpO2 98%

## 2015-10-27 NOTE — Progress Notes (Signed)
Radiation Oncology         [phone] ________________________________  Name: Lindsey Barrett MRN: [id number]  Date: 10/27/2015  DOB: 1946-01-03  Follow-Up Visit Note  CC: [name], MD  [name], MD    ICD-9-CM ICD-10-CM   1. 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 The patient was taken off Anastrozole on 10/17/15 due to nausea, diarrhea, disorientation, confusion, dizziness, fatigue, joint pain, and feeling "fuzzy" after taking the medication.  No breast pain nipple discharge or bleeding, she denies any fatigue at this time  ALLERGIES:  is allergic to septra ds and adhesive.  Meds: Current Outpatient Prescriptions  Medication Sig Dispense Refill  . aspirin 81 MG tablet Take 81 mg by mouth as directed.     . bimatoprost (LUMIGAN) 0.01 % SOLN Place 1 drop into both eyes at bedtime.    . brinzolamide (AZOPT) 1 % ophthalmic suspension 1 drop 2 (two) times daily.    . calcium carbonate (TUMS - DOSED IN MG ELEMENTAL CALCIUM) 500 MG chewable tablet Chew 1 tablet by mouth as needed for indigestion or heartburn.    . Ergocalciferol (VITAMIN D2) 2000 UNITS TABS Take 1 tablet by mouth daily.    [name] ibuprofen (ADVIL,MOTRIN) 200 MG tablet Take 200 mg by mouth every 6 (six) hours as needed.    . ranitidine (ZANTAC) 150 MG tablet Take 1 tablet (150 mg total) by mouth 2 (two) times daily. 60 tablet 0  . rosuvastatin (CRESTOR) 40 MG tablet Take 1 tablet (40 mg total) by mouth daily. 90 tablet 3  . anastrozole (ARIMIDEX) 1 MG tablet Take 1 tablet (1 mg total) by mouth daily. (Patient not taking: Reported on 07/13/2015) 90 tablet 3  . [DISCONTINUED] metoprolol tartrate (LOPRESSOR) 25 MG tablet Take 1 tablet (25 mg  total) by mouth as directed. 1 pill every 8 hours as needed for tachycardia or palpitations. 30 tablet 2   No current facility-administered medications for this encounter.    Physical Findings: The patient is in no acute distress. Patient is alert and oriented.  height is 5\' 4"  (1.626 m) and weight is 172 lb (78.019 kg). Her oral temperature is 98.1 F (36.7 C). Her blood pressure is 119/58 and her pulse is 75. Her respiration is 16 and oxygen saturation is 98%.   Lungs are clear to auscultation bilaterally. Heart has regular rate and rhythm. No palpable cervical, supraclavicular, or axillary adenopathy. Abdomen soft, non-tender, normal bowel sounds.  Hyperpigmentation changes of the left breast. Continued mild edema noted. No dominant masses, nipple discharge/bleeding.  Lab Findings: Lab Results  Component Value Date   WBC 6.0 08/10/2015   HGB 13.8 08/10/2015   HCT 41.4 08/10/2015   MCV 86.1 08/10/2015   PLT 224.0 08/10/2015    Radiographic Findings: No results found.  Impression:  The patient is recovering from the effects of radiation.  No signs of infection within the breast or recurrence.  Plan: The patient's appointment with Dr. Lindi Adie was moved up to 11/01/15 to discuss her symptoms with Anastrozole and discuss taking a new AI. I advised the patient to use Vitamin E lotion or "Udderly Smooth" on the treatment area. The patient will follow up in 3 months. ____________________________________ -----------------------------------  Blair Promise, PhD, MD  This document serves as a record of services personally performed by Gery Pray, MD. It was created on his behalf by Darcus Austin, a trained medical scribe. The creation of this record is based on the scribe's personal observations and the provider's statements to them. This document has been checked and approved by the attending provider.

## 2015-10-27 NOTE — Addendum Note (Signed)
Encounter addended by: Jacqulyn Liner, RN on: 10/27/2015  4:43 PM<BR>     Documentation filed: Charges VN

## 2015-11-01 ENCOUNTER — Encounter: Payer: Self-pay | Admitting: Hematology and Oncology

## 2015-11-01 ENCOUNTER — Telehealth: Payer: Self-pay | Admitting: Hematology and Oncology

## 2015-11-01 ENCOUNTER — Ambulatory Visit (HOSPITAL_BASED_OUTPATIENT_CLINIC_OR_DEPARTMENT_OTHER): Payer: Medicare Other | Admitting: Hematology and Oncology

## 2015-11-01 VITALS — BP 115/58 | HR 69 | Temp 98.5°F | Resp 18 | Wt 170.9 lb

## 2015-11-01 DIAGNOSIS — Z170 Estrogen receptor positive status [ER+]: Secondary | ICD-10-CM

## 2015-11-01 DIAGNOSIS — C50212 Malignant neoplasm of upper-inner quadrant of left female breast: Secondary | ICD-10-CM | POA: Diagnosis not present

## 2015-11-01 MED ORDER — LETROZOLE 2.5 MG PO TABS: 2.5000 mg | ORAL_TABLET | Freq: Every day | ORAL | Status: DC

## 2015-11-01 NOTE — Telephone Encounter (Signed)
appt made and avs printed °

## 2015-11-01 NOTE — Assessment & Plan Note (Signed)
Left lumpectomy 05/31/2015: Invasive ductal carcinoma grade 2, 1.5 cm, 0/3 lymph nodes negative, broadly involves inferior margin, ER 100%, PR 95%, HER-2 negative, Ki-67 5%, T1 cN0 stage IA, Oncotype DX score 18, 12% ROR, Reexcision of margins negative  Adjuvant radiation therapy with Dr. Sondra Come 08/10/15- 09/16/15 Current treatment: Anastrozole 1 mg daily started June 2017 Anastrozole toxicities:   Return to clinic in 6 months for follow-up

## 2015-11-01 NOTE — Progress Notes (Signed)
Patient Care Team: [name], MD as PCP - General (Internal Medicine) [name], MD as Consulting Physician (Gynecology) Sylvan Cheese, NP as Nurse Practitioner (Hematology and Oncology)  DIAGNOSIS: Breast cancer of upper-inner quadrant of left female breast [facility])   Staging form: Breast, AJCC 7th Edition     Clinical stage from 05/25/2015: Stage IA (T1c, N0, M0) - Unsigned       Staging comments: Staged at breast conference on 1.18.17  SUMMARY OF ONCOLOGIC HISTORY:   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[place])   05/18/2015 Initial Diagnosis Left breast biopsy: Invasive ductal carcinoma low to intermediate grade, ER 100%, PR 95%, HER-2 negative ratio 1.02, Ki-67 5%, left breast asymmetry 1.3 cm, T1c N0 stage IA   05/31/2015 Surgery Left lumpectomy: Invasive ductal carcinoma grade 2, 1.5 cm, 0/3 lymph nodes negative, broadly involves inferior margin, ER 100%, PR 95%, HER-2 negative duration 112, Ki-67 5%, T1 cN0 stage IA, Oncotype DX score 18, 12% ROR   07/01/2015 Surgery Reexcision of the left inferior margin: Fibrocystic changes with UDH   08/10/2015 - 09/16/2015 Radiation Therapy Adj XRT   10/06/2015 -  Anti-estrogen oral therapy Anastrozole 1 mg by mouth daily 5 years    CHIEF COMPLIANT: Inability to tolerate anastrozole  INTERVAL HISTORY: Lindsey Barrett is a 70 year old with above-mentioned history of left breast cancer treated with lumpectomy and radiation. She started anastrozole and 10/06/2015 but stopped 10/17/2015 because of dizziness, lightheadedness, fatigue, depression. She also had intermittent diarrhea and nausea. We asked her to stop the anastrozole and to come here for follow-up. She states extremely busy throughout the day at her work and also does not have a good sleep hygiene. She feels tired most of the day especially at the end of the day. She has not been exercising. Effexor radiation has completely resolved.  REVIEW OF  SYSTEMS:   Constitutional: Denies fevers, chills or abnormal weight loss Eyes: Denies blurriness of vision Ears, nose, mouth, throat, and face: Denies mucositis or sore throat Respiratory: Denies cough, dyspnea or wheezes Cardiovascular: Denies palpitation, chest discomfort Gastrointestinal:  Denies nausea, heartburn or change in bowel habits Skin: Radiation dermatitis completely resolved Lymphatics: Denies new lymphadenopathy or easy bruising Neurological:Denies numbness, tingling or new weaknesses Behavioral/Psych: Mood is stable, no new changes  Extremities: No lower extremity edema  All other systems were reviewed with the patient and are negative.  I have reviewed the past medical history, past surgical history, social history and family history with the patient and they are unchanged from previous note.  ALLERGIES:  is allergic to septra ds and adhesive.  MEDICATIONS:  Current Outpatient Prescriptions  Medication Sig Dispense Refill  . anastrozole (ARIMIDEX) 1 MG tablet Take 1 tablet (1 mg total) by mouth daily. (Patient not taking: Reported on 07/13/2015) 90 tablet 3  . aspirin 81 MG tablet Take 81 mg by mouth as directed.     . bimatoprost (LUMIGAN) 0.01 % SOLN Place 1 drop into both eyes at bedtime.    . brinzolamide (AZOPT) 1 % ophthalmic suspension 1 drop 2 (two) times daily.    . calcium carbonate (TUMS - DOSED IN MG ELEMENTAL CALCIUM) 500 MG chewable tablet Chew 1 tablet by mouth as needed for indigestion or heartburn.    . Ergocalciferol (VITAMIN D2) 2000 UNITS TABS Take 1 tablet by mouth daily.    [name] ibuprofen (ADVIL,MOTRIN) 200 MG tablet Take 200 mg by mouth every 6 (six) hours as needed.    [name]  ranitidine (ZANTAC) 150 MG tablet Take 1 tablet (150 mg total) by mouth 2 (two) times daily. 60 tablet 0  . rosuvastatin (CRESTOR) 40 MG tablet Take 1 tablet (40 mg total) by mouth daily. 90 tablet 3  . [DISCONTINUED] metoprolol tartrate (LOPRESSOR) 25 MG tablet Take 1 tablet (25 mg  total) by mouth as directed. 1 pill every 8 hours as needed for tachycardia or palpitations. 30 tablet 2   No current facility-administered medications for this visit.    PHYSICAL EXAMINATION: ECOG PERFORMANCE STATUS: 1 - Symptomatic but completely ambulatory  Filed Vitals:   11/01/15 0840  BP: 115/58  Pulse: 69  Temp: 98.5 F (36.9 C)  Resp: 18   Filed Weights   11/01/15 0840  Weight: 170 lb 14.4 oz (77.52 kg)    GENERAL:alert, no distress and comfortable SKIN: skin color, texture, turgor are normal, no rashes or significant lesions EYES: normal, Conjunctiva are pink and non-injected, sclera clear OROPHARYNX:no exudate, no erythema and lips, buccal mucosa, and tongue normal  NECK: supple, thyroid normal size, non-tender, without nodularity LYMPH:  no palpable lymphadenopathy in the cervical, axillary or inguinal LUNGS: clear to auscultation and percussion with normal breathing effort HEART: regular rate & rhythm and no murmurs and no lower extremity edema ABDOMEN:abdomen soft, non-tender and normal bowel sounds MUSCULOSKELETAL:no cyanosis of digits and no clubbing  NEURO: alert & oriented x 3 with fluent speech, no focal motor/sensory deficits EXTREMITIES: No lower extremity edema  LABORATORY DATA:  I have reviewed the data as listed   Chemistry      Component Value Date/Time   NA 140 08/10/2015 0913   NA 142 05/25/2015 1229   K 4.2 08/10/2015 0913   K 4.4 05/25/2015 1229   CL 103 08/10/2015 0913   CO2 32 08/10/2015 0913   CO2 30* 05/25/2015 1229   BUN 12 08/10/2015 0913   BUN 12.9 05/25/2015 1229   CREATININE 0.62 08/10/2015 0913   CREATININE 0.8 05/25/2015 1229      Component Value Date/Time   CALCIUM 9.4 08/10/2015 0913   CALCIUM 9.3 05/25/2015 1229   ALKPHOS 49 08/10/2015 0913   ALKPHOS 59 05/25/2015 1229   AST 25 08/10/2015 0913   AST 22 05/25/2015 1229   ALT 22 08/10/2015 0913   ALT 20 05/25/2015 1229   BILITOT 1.4* 08/10/2015 0913   BILITOT 1.63*  05/25/2015 1229       Lab Results  Component Value Date   WBC 6.0 08/10/2015   HGB 13.8 08/10/2015   HCT 41.4 08/10/2015   MCV 86.1 08/10/2015   PLT 224.0 08/10/2015   NEUTROABS 3.7 08/10/2015     ASSESSMENT & PLAN:  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[place]) Left lumpectomy 05/31/2015: Invasive ductal carcinoma grade 2, 1.5 cm, 0/3 lymph nodes negative, broadly involves inferior margin, ER 100%, PR 95%, HER-2 negative, Ki-67 5%, T1 cN0 stage IA, Oncotype DX score 18, 12% ROR, Reexcision of margins negative  Adjuvant radiation therapy with Dr. Sondra Come 08/10/15- 09/16/15 Current treatment: Anastrozole 1 mg daily started June1st 2017 stopped 10/17/2015 due to dizziness lightheadedness and fatigue, switched to letrozole starting 11/09/2015  Return to clinic in 1 months for follow-up to evaluate side effects of letrozole   No orders of the defined types were placed in this encounter.   The patient has a good understanding of the overall plan. she agrees with it. she will call with any problems that may develop before the next visit here.   Nicholas Lose  K, MD 11/01/2015

## 2015-11-10 ENCOUNTER — Ambulatory Visit: Payer: Medicare Other | Admitting: Hematology and Oncology

## 2015-11-18 DIAGNOSIS — H04123 Dry eye syndrome of bilateral lacrimal glands: Secondary | ICD-10-CM | POA: Diagnosis not present

## 2015-11-18 DIAGNOSIS — H2513 Age-related nuclear cataract, bilateral: Secondary | ICD-10-CM | POA: Diagnosis not present

## 2015-11-18 DIAGNOSIS — H524 Presbyopia: Secondary | ICD-10-CM | POA: Diagnosis not present

## 2015-11-18 DIAGNOSIS — H401132 Primary open-angle glaucoma, bilateral, moderate stage: Secondary | ICD-10-CM | POA: Diagnosis not present

## 2015-12-01 ENCOUNTER — Encounter: Payer: Self-pay | Admitting: Gynecology

## 2015-12-01 ENCOUNTER — Ambulatory Visit (INDEPENDENT_AMBULATORY_CARE_PROVIDER_SITE_OTHER): Payer: Medicare Other | Admitting: Gynecology

## 2015-12-01 VITALS — BP 118/74 | Ht 64.0 in | Wt 171.0 lb

## 2015-12-01 DIAGNOSIS — Z01419 Encounter for gynecological examination (general) (routine) without abnormal findings: Secondary | ICD-10-CM | POA: Diagnosis not present

## 2015-12-01 DIAGNOSIS — N952 Postmenopausal atrophic vaginitis: Secondary | ICD-10-CM | POA: Diagnosis not present

## 2015-12-01 DIAGNOSIS — C50912 Malignant neoplasm of unspecified site of left female breast: Secondary | ICD-10-CM

## 2015-12-01 NOTE — Patient Instructions (Signed)

## 2015-12-01 NOTE — Progress Notes (Signed)
    Lindsey Barrett 10-28-1945 [id number]        70 y.o.  G0P0  for breast and pelvic exam.  Past medical history,surgical history, problem list, medications, allergies, family history and social history were all reviewed and documented as reviewed in the EPIC chart.  ROS:  Performed with pertinent positives and negatives included in the history, assessment and plan.   Additional significant findings :  None   Exam: Caryn Bee assistant Vitals:   12/01/15 0850  BP: 118/74  Weight: 171 lb (77.6 kg)  Height: 5\' 4"  (1.626 m)   General appearance:  Normal affect, orientation and appearance. Skin: Grossly normal HEENT: Without gross lesions.  No cervical or supraclavicular adenopathy. Thyroid normal.  Lungs:  Clear without wheezing, rales or rhonchi Cardiac: RR, without RMG Abdominal:  Soft, nontender, without masses, guarding, rebound, organomegaly or hernia Breasts:  Examined lying and sitting.  Right without masses, retractions, discharge or axillary adenopathy. Left status post lumpectomy and lymph node sample. Some induration around her lumpectomy scar. No masses, discharge or axillary adenopathy Pelvic:  Ext/BUS/Vagina with atrophic changes  Cervix with atrophic changes  Uterus anteverted, normal size, shape and contour, midline and mobile nontender   Adnexa without masses or tenderness    Anus and perineum normal   Rectovaginal normal sphincter tone without palpated masses or tenderness.    Assessment/Plan:  70 y.o. G0P0 female for breast and pelvic exam.   1. Postmenopausal/atrophic genital changes. Without significant hot flushes, night sweats, vaginal dryness or any vaginal bleeding. Continue monitor report any issues or vaginal bleeding. 2. Recent diagnosis with left breast cancer this year. Status post lumpectomy, lymph node sampling.  E/P receptor positive.  Currently on letrozole. Status post radiation. Actively being followed by her medical oncologist, surgeon and  radiation oncologist. 3. Pap smear 2016. No Pap smear done today. No history of significant abnormal Pap smears. Plan repeat Pap smear at 3 year interval. Patient uncomfortable with stop screening based on current screening guidelines. 4. Colonoscopy 2007. Plan repeat colonoscopy this year at 10 year interval. 5. DEXA 2016 normal. Plan repeat at 5 year interval until last her oncologist feels sooner recommended. 6. Health maintenance. No routine lab work done as this is done elsewhere. Follow up 1 year, sooner as needed.   [name] MD, 9:24 AM 12/01/2015

## 2015-12-02 ENCOUNTER — Telehealth: Payer: Self-pay | Admitting: Hematology and Oncology

## 2015-12-02 ENCOUNTER — Encounter: Payer: Self-pay | Admitting: Hematology and Oncology

## 2015-12-02 ENCOUNTER — Ambulatory Visit (HOSPITAL_BASED_OUTPATIENT_CLINIC_OR_DEPARTMENT_OTHER): Payer: Medicare Other | Admitting: Hematology and Oncology

## 2015-12-02 DIAGNOSIS — Z79811 Long term (current) use of aromatase inhibitors: Secondary | ICD-10-CM

## 2015-12-02 DIAGNOSIS — Z170 Estrogen receptor positive status [ER+]: Secondary | ICD-10-CM | POA: Diagnosis not present

## 2015-12-02 DIAGNOSIS — C50212 Malignant neoplasm of upper-inner quadrant of left female breast: Secondary | ICD-10-CM | POA: Diagnosis not present

## 2015-12-02 MED ORDER — LETROZOLE 2.5 MG PO TABS: 2.5000 mg | ORAL_TABLET | Freq: Every day | ORAL | 3 refills | Status: DC

## 2015-12-02 NOTE — Progress Notes (Signed)
Patient Care Team: [name], MD as PCP - General (Internal Medicine) [name], MD as Consulting Physician (Gynecology) Sylvan Cheese, NP as Nurse Practitioner (Hematology and Oncology)  DIAGNOSIS: Breast cancer of upper-inner quadrant of left female breast [facility])   Staging form: Breast, AJCC 7th Edition   - Clinical stage from 05/25/2015: Stage IA (T1c, N0, M0) - Unsigned         Staging comments: Staged at breast conference on 1.18.17  SUMMARY OF ONCOLOGIC HISTORY:   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[place])   05/18/2015 Initial Diagnosis    Left breast biopsy: Invasive ductal carcinoma low to intermediate grade, ER 100%, PR 95%, HER-2 negative ratio 1.02, Ki-67 5%, left breast asymmetry 1.3 cm, T1c N0 stage IA     05/31/2015 Surgery    Left lumpectomy: Invasive ductal carcinoma grade 2, 1.5 cm, 0/3 lymph nodes negative, broadly involves inferior margin, ER 100%, PR 95%, HER-2 negative duration 112, Ki-67 5%, T1 cN0 stage IA, Oncotype DX score 18, 12% ROR     07/01/2015 Surgery    Reexcision of the left inferior margin: Fibrocystic changes with UDH     08/10/2015 - 09/16/2015 Radiation Therapy    Adj XRT     10/06/2015 -  Anti-estrogen oral therapy    Anastrozole 1 mg by mouth daily 5 years, switched to letrozole 11/09/2015 due to dizziness lightheadedness and fatigue      CHIEF COMPLIANT: Follow-up on letrozole  INTERVAL HISTORY: Lindsey Barrett is a 70 year old with above-mentioned history of left breast cancer treated with lumpectomy and radiation and was on adjuvant anastrozole but developed dizziness and lightheadedness and the medicine was stopped and changed to letrozole. She is tolerating letrozole much better. She does not have dizziness or lightheadedness. She can does have hot flashes and occasional tender spots on her scalp that seem to be worrying her.  REVIEW OF SYSTEMS:   Constitutional: Denies fevers, chills or  abnormal weight loss Eyes: Denies blurriness of vision Ears, nose, mouth, throat, and face: Denies mucositis or sore throat Respiratory: Denies cough, dyspnea or wheezes Cardiovascular: Denies palpitation, chest discomfort Gastrointestinal:  Denies nausea, heartburn or change in bowel habits Skin: Denies abnormal skin rashes Lymphatics: Denies new lymphadenopathy or easy bruising Neurological:Denies numbness, tingling or new weaknesses Behavioral/Psych: Mood is stable, no new changes  Extremities: No lower extremity edema Breast:  denies any pain or lumps or nodules in either breasts All other systems were reviewed with the patient and are negative.  I have reviewed the past medical history, past surgical history, social history and family history with the patient and they are unchanged from previous note.  ALLERGIES:  is allergic to septra ds [sulfamethoxazole-trimethoprim]; arimidex [anastrozole]; and adhesive [tape].  MEDICATIONS:  Current Outpatient Prescriptions  Medication Sig Dispense Refill  . aspirin 81 MG tablet Take 81 mg by mouth as directed.     . bimatoprost (LUMIGAN) 0.01 % SOLN Place 1 drop into both eyes at bedtime.    . brinzolamide (AZOPT) 1 % ophthalmic suspension 1 drop 2 (two) times daily.    . calcium carbonate (TUMS - DOSED IN MG ELEMENTAL CALCIUM) 500 MG chewable tablet Chew 1 tablet by mouth as needed for indigestion or heartburn.    . Ergocalciferol (VITAMIN D2) 2000 UNITS TABS Take 1 tablet by mouth daily.    [name] ibuprofen (ADVIL,MOTRIN) 200 MG tablet Take 200 mg by mouth every 6 (six) hours as needed.    [name]  letrozole (FEMARA) 2.5 MG tablet Take 1 tablet (2.5 mg total) by mouth daily. 30 tablet 0  . ranitidine (ZANTAC) 150 MG tablet Take 1 tablet (150 mg total) by mouth 2 (two) times daily. 60 tablet 0  . rosuvastatin (CRESTOR) 40 MG tablet Take 1 tablet (40 mg total) by mouth daily. 90 tablet 3   No current facility-administered medications for this visit.      PHYSICAL EXAMINATION: ECOG PERFORMANCE STATUS: 1 - Symptomatic but completely ambulatory  Vitals:   12/02/15 0900  BP: 117/64  Pulse: 67  Resp: 18  Temp: 98.7 F (37.1 C)   Filed Weights   12/02/15 0900  Weight: 170 lb 1.6 oz (77.2 kg)    GENERAL:alert, no distress and comfortable SKIN: skin color, texture, turgor are normal, no rashes or significant lesions EYES: normal, Conjunctiva are pink and non-injected, sclera clear OROPHARYNX:no exudate, no erythema and lips, buccal mucosa, and tongue normal  NECK: supple, thyroid normal size, non-tender, without nodularity LYMPH:  no palpable lymphadenopathy in the cervical, axillary or inguinal LUNGS: clear to auscultation and percussion with normal breathing effort HEART: regular rate & rhythm and no murmurs and no lower extremity edema ABDOMEN:abdomen soft, non-tender and normal bowel sounds MUSCULOSKELETAL:no cyanosis of digits and no clubbing  NEURO: alert & oriented x 3 with fluent speech, no focal motor/sensory deficits EXTREMITIES: No lower extremity edema BREAST: No palpable masses or nodules in either right or left breasts. No palpable axillary supraclavicular or infraclavicular adenopathy no breast tenderness or nipple discharge. (exam performed in the presence of a chaperone)  LABORATORY DATA:  I have reviewed the data as listed   Chemistry      Component Value Date/Time   NA 140 08/10/2015 0913   NA 142 05/25/2015 1229   K 4.2 08/10/2015 0913   K 4.4 05/25/2015 1229   CL 103 08/10/2015 0913   CO2 32 08/10/2015 0913   CO2 30 (H) 05/25/2015 1229   BUN 12 08/10/2015 0913   BUN 12.9 05/25/2015 1229   CREATININE 0.62 08/10/2015 0913   CREATININE 0.8 05/25/2015 1229      Component Value Date/Time   CALCIUM 9.4 08/10/2015 0913   CALCIUM 9.3 05/25/2015 1229   ALKPHOS 49 08/10/2015 0913   ALKPHOS 59 05/25/2015 1229   AST 25 08/10/2015 0913   AST 22 05/25/2015 1229   ALT 22 08/10/2015 0913   ALT 20 05/25/2015  1229   BILITOT 1.4 (H) 08/10/2015 0913   BILITOT 1.63 (H) 05/25/2015 1229       Lab Results  Component Value Date   WBC 6.0 08/10/2015   HGB 13.8 08/10/2015   HCT 41.4 08/10/2015   MCV 86.1 08/10/2015   PLT 224.0 08/10/2015   NEUTROABS 3.7 08/10/2015     ASSESSMENT & PLAN:  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[place]) Left lumpectomy 05/31/2015: Invasive ductal carcinoma grade 2, 1.5 cm, 0/3 lymph nodes negative, broadly involves inferior margin, ER 100%, PR 95%, HER-2 negative, Ki-67 5%, T1 cN0 stage IA, Oncotype DX score 18, 12% ROR, Reexcision of margins negative  Adjuvant radiation therapy with Dr. Sondra Come 08/10/15- 09/16/15 Current treatment: Anastrozole 1 mg daily started June1st 2017 stopped 10/17/2015 due to dizziness lightheadedness and fatigue, switched to letrozole starting 11/09/2015  Letrozole toxicities: 1. Intermittent hot flashes 2. Tender spots on the scalp Patient is willing to continue with letrozole. She does not have the dizziness that she had with anastrozole.  I encouraged her to increase her vitamin D  to 5000 IUs daily.  Return to clinic in 3 months for follow-up   No orders of the defined types were placed in this encounter.  The patient has a good understanding of the overall plan. she agrees with it. she will call with any problems that may develop before the next visit here.   [name], MD 12/02/15

## 2015-12-02 NOTE — Assessment & Plan Note (Signed)
Left lumpectomy 05/31/2015: Invasive ductal carcinoma grade 2, 1.5 cm, 0/3 lymph nodes negative, broadly involves inferior margin, ER 100%, PR 95%, HER-2 negative, Ki-67 5%, T1 cN0 stage IA, Oncotype DX score 18, 12% ROR, Reexcision of margins negative  Adjuvant radiation therapy with Dr. Sondra Come 08/10/15- 09/16/15 Current treatment: Anastrozole 1 mg daily started June1st 2017 stopped 10/17/2015 due to dizziness lightheadedness and fatigue, switched to letrozole starting 11/09/2015  Letrozole toxicities:  Return to clinic in 6 months for follow-up

## 2015-12-02 NOTE — Telephone Encounter (Signed)
appt made and avs printed °

## 2015-12-07 ENCOUNTER — Encounter: Payer: Self-pay | Admitting: Gastroenterology

## 2015-12-13 ENCOUNTER — Encounter: Payer: Medicare Other | Admitting: Nurse Practitioner

## 2015-12-15 ENCOUNTER — Encounter: Payer: Medicare Other | Admitting: Adult Health

## 2015-12-20 ENCOUNTER — Encounter: Payer: Medicare Other | Admitting: Nurse Practitioner

## 2016-01-02 ENCOUNTER — Ambulatory Visit (HOSPITAL_BASED_OUTPATIENT_CLINIC_OR_DEPARTMENT_OTHER): Payer: Medicare Other | Admitting: Adult Health

## 2016-01-02 ENCOUNTER — Encounter: Payer: Medicare Other | Admitting: Adult Health

## 2016-01-02 ENCOUNTER — Encounter: Payer: Self-pay | Admitting: Adult Health

## 2016-01-02 VITALS — BP 113/66 | HR 68 | Temp 98.0°F | Resp 18 | Ht 64.0 in | Wt 172.5 lb

## 2016-01-02 DIAGNOSIS — Z170 Estrogen receptor positive status [ER+]: Secondary | ICD-10-CM | POA: Diagnosis not present

## 2016-01-02 DIAGNOSIS — Z79811 Long term (current) use of aromatase inhibitors: Secondary | ICD-10-CM | POA: Diagnosis not present

## 2016-01-02 DIAGNOSIS — C50212 Malignant neoplasm of upper-inner quadrant of left female breast: Secondary | ICD-10-CM

## 2016-01-02 NOTE — Progress Notes (Signed)
CLINIC:  Survivorship   REASON FOR VISIT:  Routine follow-up post-treatment for a recent history of breast cancer.  BRIEF ONCOLOGIC HISTORY:    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[place])   05/18/2015 Initial Diagnosis    Left breast biopsy: Invasive ductal carcinoma low to intermediate grade, ER 100%, PR 95%, HER-2 negative ratio 1.02, Ki-67 5%, left breast asymmetry 1.3 cm, T1c N0 stage IA      05/31/2015 Surgery    Left lumpectomy [name]): Invasive ductal carcinoma grade 2, 1.5 cm, 0/3 lymph nodes negative, broadly involves inferior margin, ER 100%, PR 95%, HER-2 negative duration 112, Ki-67 5%, T1 cN0 stage IA, Oncotype DX score 18, 12% ROR      05/31/2015 Oncotype testing    Oncotype DX score: 18/12% ROR      07/01/2015 Surgery    Reexcision of the left inferior margin: Fibrocystic changes with UDH      08/10/2015 - 09/16/2015 Radiation Therapy    Adj XRT ([place]). 50.4 Gy in 28 fractions to the left breast      10/06/2015 -  Anti-estrogen oral therapy    Anastrozole 1 mg by mouth daily 5 years, switched to letrozole 11/09/2015 due to dizziness lightheadedness and fatigue       INTERVAL HISTORY:  Lindsey Barrett presents to the [facility] today for our initial meeting to review her survivorship care plan detailing her treatment course for breast cancer, as well as monitoring long-term side effects of that treatment, education regarding health maintenance, screening, and overall wellness and health promotion.     Overall, Lindsey Barrett reports feeling quite well since completing her radiation therapy approximately 3 months ago.  She initially started her anti-estrogen therapy with anastrazole for about 1 month and was switched to letrozole in early 11/2015 due to dizziness and fatigue.  Those symptoms have nearly resolved since starting the letrozole; she is tolerating this medication much better.  She continues to take the letrozole at night as  suggested by Dr. [name], which has been helpful.  She says that in early afternoon she "hits a wall" in terms of her energy levels, but this is manageable thus far.  She also reports some (L) arm/axilla soreness, as well as mild edema; both are manageable without intervention as well.  She reports that her stool has changed color slightly since starting the letrozole; it has become darker. She denies any black, tarry stools.    Her colonoscopy will be due this Fall.  She had a recent dental procedure to have a crown placed.  She is due for a dilated eye exam within the next few months.  She says that "2017 has been the year of all things related to my health."  She is looking forward to getting a lot of these tests behind her.    She has some anxiety and concern about cancer recurrence; she was very careful not to say "fear" of recurrence because her faith encourages her not to be fearful.  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     REVIEW OF SYSTEMS:  Review of Systems  Constitutional: Positive for malaise/fatigue.       (+) fatigue, but better than when on anastrazole.   HENT: Negative.   Eyes: Negative.   Respiratory: Negative.   Cardiovascular: Negative.   Gastrointestinal:       Darker stool since starting letrozole  Genitourinary: Negative.   Musculoskeletal:  Negative.   Skin: Negative.   Neurological: Negative.   Endo/Heme/Allergies: Negative.   Psychiatric/Behavioral: The patient is nervous/anxious.   GU: Denies vaginal bleeding. Breast: Denies any new nodularity, masses, nipple changes, or nipple discharge. (+) left breast and axilla tenderness particularly when wearing a bra and when stretching her arm above her head.    A 14-point review of systems was completed and was negative, except as noted above.   ONCOLOGY TREATMENT TEAM:  1. Surgeon:  Dr. [name] at [facility] 2. Medical  Oncologist: Dr. [name]  3. Radiation Oncologist: Dr. Sondra Come    PAST MEDICAL/SURGICAL HISTORY:  Past Medical History:  Diagnosis Date  . Anxiety   . Atrophic vaginitis   . Breast cancer ([place])   . 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[place]) 05/20/2015  . Breast cyst    x3  . DDD (degenerative disc disease)   . Diverticulosis   . Fasting hyperglycemia   . GERD (gastroesophageal reflux disease)   . Gilbert syndrome   . Glaucoma   . History of hiatal hernia   . HSV infection   . Hyperlipidemia   . Radiation    left breast 50.4 gray  . STD (sexually transmitted disease)    HSV   Past Surgical History:  Procedure Laterality Date  . APPENDECTOMY    . BREAST CYST ASPIRATION     x2 right ,x1 left  . COLONOSCOPY  2007   neg  . DILATION AND CURETTAGE OF UTERUS    . PELVIC LAPAROSCOPY    . RADIOACTIVE SEED GUIDED MASTECTOMY WITH AXILLARY SENTINEL LYMPH NODE BIOPSY Left 05/31/2015   Procedure: LEFT BREAST RADIOACTIVE SEED GUIDED LUMPECTOMY WITH LEFT AXILLARY SENTINEL LYMPH NODE BIOPSY;  Surgeon: [name], MD;  Location: [place];  Service: General;  Laterality: Left;  . RE-EXCISION OF BREAST LUMPECTOMY Left 07/01/2015   Procedure: RE-EXCISION OF LEFT BREAST INFERIOR MARGIN;  Surgeon: [name], MD;  Location: [place];  Service: General;  Laterality: Left;  . UPPER GI ENDOSCOPY  10/2010   Dr [name]  . WISDOM TOOTH EXTRACTION       ALLERGIES:  Allergies  Allergen Reactions  . Septra Ds [Sulfamethoxazole-Trimethoprim] Hives  . Arimidex [Anastrozole] Other (See Comments)    Unable to function  . Adhesive [Tape] Rash     CURRENT MEDICATIONS:  Outpatient Encounter Prescriptions as of 01/02/2016  Medication Sig  . aspirin 81 MG tablet Take 81 mg by mouth as directed.   . bimatoprost (LUMIGAN) 0.01 % SOLN Place 1 drop into both eyes at bedtime.  . brinzolamide (AZOPT) 1 % ophthalmic suspension 1 drop 2 (two)  times daily.  . calcium carbonate (TUMS - DOSED IN MG ELEMENTAL CALCIUM) 500 MG chewable tablet Chew 1 tablet by mouth as needed for indigestion or heartburn.  . Ergocalciferol (VITAMIN D2 PO) Take 5,000 Units by mouth daily.   [name] ibuprofen (ADVIL,MOTRIN) 200 MG tablet Take 200 mg by mouth every 6 (six) hours as needed.  [name] letrozole (FEMARA) 2.5 MG tablet Take 1 tablet (2.5 mg total) by mouth daily.  . ranitidine (ZANTAC) 150 MG tablet Take 1 tablet (150 mg total) by mouth 2 (two) times daily.  . rosuvastatin (CRESTOR) 40 MG tablet Take 1 tablet (40 mg total) by mouth daily.   No facility-administered encounter medications on file as of 01/02/2016.      ONCOLOGIC FAMILY HISTORY:  Family History  Problem Relation Age of Onset  . Lung cancer  Mother     smoker  . Endometriosis Mother   . Cancer Mother     Bladder cancer  . Stomach cancer Maternal Grandmother   . Coronary artery disease Father   . Cancer Father     intestinal carcinoid tumors  . Sudden death Sister     74 week old, congenital deformity  . Liver cancer Maternal Uncle   . Stroke Paternal Grandfather 40  . Leukemia Maternal Uncle   . Breast cancer Paternal Aunt     great paternal aunt-Age 20's  . Colon cancer Paternal Aunt     great paternal aunt  . Vascular Disease Paternal Grandmother     thoracic aneurysm  . Diabetes Neg Hx      GENETIC COUNSELING/TESTING: None.  SOCIAL HISTORY:  Lindsey Barrett is divorced and lives in [place], [place].  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  She is a former smoker; quit in 2000. (previously smoked 0.75 ppd x 33 years = 24.75 pack years).  She drinks alcohol regularly. Denies any illicit drug use.   PHYSICAL EXAMINATION:  Vital Signs:   Vitals:   01/02/16 1133  BP: 113/66  Pulse: 68  Resp: 18  Temp: 98 F (36.7 C)   Filed Weights   01/02/16 1133  Weight: 172 lb 8 oz (78.2 kg)   General:  Well-nourished, well-appearing female in no acute distress.  She is unaccompanied today.   HEENT: Head is normocephalic.  Pupils equal and reactive to light. Conjunctivae clear without exudate.  Sclerae anicteric. Oral mucosa is pink, moist.  Oropharynx is pink without lesions or erythema.  Lymph: No cervical, supraclavicular, or infraclavicular lymphadenopathy noted on palpation.  Cardiovascular: Regular rate and rhythm.[name] Respiratory: Clear to auscultation bilaterally. Chest expansion symmetric; breathing non-labored.  GI: Abdomen soft and round; non-tender, non-distended. Bowel sounds normoactive.  GU: Deferred.  Neuro: No focal deficits. Steady gait.  Psych: Mood and affect normal and appropriate for situation.  Extremities: No edema. Skin: Warm and dry.  LABORATORY DATA:  None for this visit.  DIAGNOSTIC IMAGING:  None for this visit.      ASSESSMENT AND PLAN:  Ms.. Barrett is a pleasant 70 y.o. female with Stage IA left breast invasive ductal carcinoma, ER+/PR+/HER2-, diagnosed in 05/2015, treated with lumpectomy, adjuvant radiation therapy, and anti-estrogen therapy initially starting with anastrazole in 10/2015 and switched to letrozole in 11/2015 d/t side effects.  She presents to the Survivorship Clinic for our initial meeting and routine follow-up post-completion of treatment for breast cancer.    1. Stage IA left breast cancer:  Lindsey Barrett is continuing to recover from definitive treatment for breast cancer. She will follow-up with her medical oncologist, Dr. [name], in 02/2016 with history and physical exam per surveillance protocol.  She will continue her anti-estrogen therapy with letrozole. Thus far, she is tolerating the letrozole much better than the anastrazole, with minimal side effects. She was instructed to make Dr. [name] or myself aware if she begins to experience any worsening side effects of the medication and I could see her back in clinic to help manage those side  effects, as needed. Common side effects of Tamoxifen were again reviewed with her as well. Today, a comprehensive survivorship care plan and treatment summary was reviewed with the patient today detailing her breast cancer diagnosis, treatment course, potential late/long-term effects of treatment, appropriate follow-up care with recommendations for the future, and patient education resources.  A copy of  this summary, along with a letter will be sent to the patient's primary care provider via mail/fax/In Basket message after today's visit.    2. Fatigue:  I encouraged her to consider participating in the [facility] program to help combat her fatigue.  Increased physical activity may provide her with improved strength and stamina to help combat her treatment-related fatigue.  She was given a brochure today with instructions on how to get enrolled if she chooses to participate.     3. Bone health:  Given Lindsey Barrett's age, history of breast cancer, and her current treatment regimen including anti-estrogen therapy with letrozole, she is at risk for bone demineralization.  Her last DEXA scan was 11/18/14 and was normal.  We discussed that she will likely be due for repeat DEXA imaging in about 2 years, or sooner at the discretion of Dr. [name] or her PCP.  In the meantime, she was encouraged to increase her consumption of foods rich in calcium, as well as increase her weight-bearing activities.  She was given education on specific activities to promote bone health.  4. Cancer screening:  Due to Lindsey Barrett's history and her age, she should receive screening for skin cancers, colon cancer, and gynecologic cancers.  The information and recommendations are listed on the patient's comprehensive care plan/treatment summary and were reviewed in detail with the patient.    5. Health maintenance and wellness promotion: Lindsey Barrett was encouraged to consume 5-7 servings of fruits and vegetables per day. We  reviewed the "Nutrition Rainbow" handout, as well as the handout "Take Control of Your Health and Reduce Your Cancer Risk" from the [place].  She was also encouraged to engage in moderate to vigorous exercise for 30 minutes per day most days of the week.  She was instructed to limit her alcohol consumption and continue to abstain from tobacco use.   6. Support services/counseling: It is not uncommon for this period of the patient's cancer care trajectory to be one of many emotions and stressors.  We discussed an opportunity for her to participate in the next session of [facility] ("Finding Your New Normal") support group series designed for patients after they have completed treatment.   Lindsey Barrett was encouraged to take advantage of our many other support services programs, support groups, and/or counseling in coping with her new life as a cancer survivor after completing anti-cancer treatment.  She was offered support today through active listening and expressive supportive counseling.  She was given information regarding our available services and encouraged to contact me with any questions or for help enrolling in any of our support group/programs.    Dispo:   -Return to cancer center to see Dr. [name] in 02/2016. -She is welcome to return back to the Survivorship Clinic at any time; no additional follow-up needed at this time.  -Consider referral back to survivorship as a long-term survivor for continued surveillance  A total of 65 minutes of face-to-face time was spent with this patient with greater than 50% of that time in counseling and care-coordination.   Mike Craze, NP Survivorship Program [facility] [phone]   Note: PRIMARY CARE PROVIDER [name], [place] [phone]

## 2016-01-20 ENCOUNTER — Ambulatory Visit (AMBULATORY_SURGERY_CENTER): Payer: Self-pay

## 2016-01-20 DIAGNOSIS — Z1211 Encounter for screening for malignant neoplasm of colon: Secondary | ICD-10-CM

## 2016-01-20 MED ORDER — SUPREP BOWEL PREP KIT 17.5-3.13-1.6 GM/177ML PO SOLN: 1.0000 | Freq: Once | ORAL | 0 refills | Status: AC

## 2016-01-20 NOTE — Progress Notes (Signed)
No allergies to eggs or soy No past problems with anesthesia No diet meds No home oxygen  Declined emmi 

## 2016-01-26 ENCOUNTER — Encounter: Payer: Self-pay | Admitting: Radiation Oncology

## 2016-01-26 ENCOUNTER — Ambulatory Visit
Admission: RE | Admit: 2016-01-26 | Discharge: 2016-01-26 | Disposition: A | Discharge status: Home / Self Care | Payer: Medicare Other | Source: Ambulatory Visit | Attending: Radiation Oncology | Admitting: Radiation Oncology

## 2016-01-26 DIAGNOSIS — Z9109 Other allergy status, other than to drugs and biological substances: Secondary | ICD-10-CM | POA: Diagnosis not present

## 2016-01-26 DIAGNOSIS — Z9889 Other specified postprocedural states: Secondary | ICD-10-CM | POA: Insufficient documentation

## 2016-01-26 DIAGNOSIS — Z79811 Long term (current) use of aromatase inhibitors: Secondary | ICD-10-CM | POA: Diagnosis not present

## 2016-01-26 DIAGNOSIS — Z881 Allergy status to other antibiotic agents status: Secondary | ICD-10-CM | POA: Insufficient documentation

## 2016-01-26 DIAGNOSIS — Z170 Estrogen receptor positive status [ER+]: Secondary | ICD-10-CM | POA: Insufficient documentation

## 2016-01-26 DIAGNOSIS — R600 Localized edema: Secondary | ICD-10-CM | POA: Insufficient documentation

## 2016-01-26 DIAGNOSIS — Z7982 Long term (current) use of aspirin: Secondary | ICD-10-CM | POA: Insufficient documentation

## 2016-01-26 DIAGNOSIS — C50212 Malignant neoplasm of upper-inner quadrant of left female breast: Secondary | ICD-10-CM | POA: Diagnosis not present

## 2016-01-26 DIAGNOSIS — Z888 Allergy status to other drugs, medicaments and biological substances status: Secondary | ICD-10-CM | POA: Diagnosis not present

## 2016-01-26 NOTE — Progress Notes (Signed)
Radiation Oncology         [phone] ________________________________  Name: BRENNA FRIESENHAHN MRN: [id number]  Date: 01/26/2016  DOB: 09-08-1945  Follow-Up Visit Note  CC: Lindsey Barrett, Lindsey Barrett  Lindsey Bookbinder, Lindsey Barrett    ICD-9-CM ICD-10-CM   1. 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 The patient followed up with Dr. Lindi Adie on 12/02/15 and has been on Letrozole since 11/09/15. She reports having soreness under her left arm. She reports good range of motion in her left arm. She also reports having joint pain and ringing in her ears since taking Letrozole.  She reports she is always tired and is working full time.  ALLERGIES:  is allergic to septra ds [sulfamethoxazole-trimethoprim]; arimidex [anastrozole]; and adhesive [tape].  Meds: Current Outpatient Prescriptions  Medication Sig Dispense Refill  . aspirin 81 MG tablet Take 81 mg by mouth as directed.     . bimatoprost (LUMIGAN) 0.01 % SOLN Place 1 drop into both eyes at bedtime.    . brinzolamide (AZOPT) 1 % ophthalmic suspension 1 drop 2 (two) times daily.    . calcium carbonate (TUMS - DOSED IN MG ELEMENTAL CALCIUM) 500 MG chewable tablet Chew 1 tablet by mouth as needed for indigestion or heartburn.    . Ergocalciferol (VITAMIN D2 PO) Take 5,000 Units by mouth daily.     Lindsey Barrett ibuprofen (ADVIL,MOTRIN) 200 MG tablet Take 200 mg by mouth every 6 (six) hours as needed.    Lindsey Barrett letrozole (FEMARA) 2.5 MG tablet Take 1 tablet (2.5 mg total) by mouth daily. 90 tablet 3  . ranitidine (ZANTAC) 150 MG tablet Take 1 tablet (150 mg total) by mouth 2 (two) times daily. 60 tablet 0  . rosuvastatin (CRESTOR) 40 MG tablet Take 1 tablet (40 mg total) by mouth daily.  90 tablet 3   No current facility-administered medications for this encounter.     Physical Findings: The patient is in no acute distress. Patient is alert and oriented.  height is _0  (1.626 m) and weight is 172 lb 12.8 oz (78.4 kg). Her oral temperature is 98.6 F (37 C). Her blood pressure is 133/72 and her pulse is 77. Her oxygen saturation is 96%.   Lungs are clear to auscultation bilaterally. Heart has regular rate and rhythm. No palpable cervical, supraclavicular, or axillary adenopathy.  Left breast continues to have edema. Some induration superior to her lumpectomy scar and in the left axilla. No dominant mass in the breast, no nipple discharge or bleeding. Mild hyperpigmentation changes of the left breast. No dominant masses, nipple discharge/bleeding in the right breast.  Lab Findings: Lab Results  Component Value Date   WBC 6.0 08/10/2015   HGB 13.8 08/10/2015   HCT 41.4 08/10/2015   MCV 86.1 08/10/2015   PLT 224.0 08/10/2015    Radiographic Findings: No results found.  Impression:  No evidence of recurrence on exam today. I recommended the patient do massages along the scar tissue in the left breast and left axilla to reduce swelling and soreness.  Plan: The patient will continue close follow up with medical oncology in light of her anti-estrogen medication. The patient will see radiation oncology on a PRN basis. ____________________________________ -----------------------------------  Lindsey Promise, PhD, Lindsey Barrett  This document serves as a record of services personally performed by Gery Pray, Lindsey Barrett. It was created on his behalf by Darcus Austin, a trained medical scribe. The creation of this record is based on the scribe's personal observations and the provider's statements to them. This document has been checked and approved by the attending provider.

## 2016-01-26 NOTE — Progress Notes (Signed)
Lindsey Barrett here for follow up after treatment to her left breast.  She reports having soreness under her left arm.  She also reports having joint pain and ringing in her ears since taking Letrozole.  She reports she is always tired.  She is working full time.  The skin on her left breast has hyperpigmentation with some redness under her left breast.  BP 133/72 (BP Location: Right Arm, Patient Position: Sitting)   Pulse 77   Temp 98.6 F (37 C) (Oral)   Ht 5\' 4"  (1.626 m)   Wt 172 lb 12.8 oz (78.4 kg)   SpO2 96%   BMI 29.66 kg/m    Wt Readings from Last 3 Encounters:  01/26/16 172 lb 12.8 oz (78.4 kg)  01/02/16 172 lb 8 oz (78.2 kg)  12/02/15 170 lb 1.6 oz (77.2 kg)

## 2016-01-27 ENCOUNTER — Encounter: Payer: Self-pay | Admitting: Internal Medicine

## 2016-01-27 ENCOUNTER — Ambulatory Visit (INDEPENDENT_AMBULATORY_CARE_PROVIDER_SITE_OTHER): Payer: Medicare Other | Admitting: Internal Medicine

## 2016-01-27 VITALS — BP 118/72 | HR 72 | Temp 98.2°F | Resp 16 | Ht 64.0 in | Wt 172.0 lb

## 2016-01-27 DIAGNOSIS — Z7184 Encounter for health counseling related to travel: Secondary | ICD-10-CM

## 2016-01-27 DIAGNOSIS — Z23 Encounter for immunization: Secondary | ICD-10-CM

## 2016-01-27 DIAGNOSIS — Z7189 Other specified counseling: Secondary | ICD-10-CM | POA: Diagnosis not present

## 2016-01-27 MED ORDER — AMOXICILLIN-POT CLAVULANATE 875-125 MG PO TABS
1.0000 | ORAL_TABLET | Freq: Two times a day (BID) | ORAL | 0 refills | Status: DC
Start: 2016-01-27 — End: 2016-08-29

## 2016-01-27 NOTE — Assessment & Plan Note (Signed)
We discussed over the counter medication she should take with her including pepto, otc pain medication, allergy medication, etc.  We discussed antibiotics and I will give a prescription for augmentin.   Advised compression socks for the flight.   We reviewed her vaccines -flu vaccine today  She will return in a few weeks for a prevnar and hepatitis A vaccine

## 2016-01-27 NOTE — Patient Instructions (Addendum)
  Augmentin was sent to your pharmacy to take on your trip.    A flu vaccine was administered today.   Medications reviewed and updated.  No changes recommended at this time.  Your prescription(s) have been submitted to your pharmacy. Please take as directed and contact our office if you believe you are having problem(s) with the medication(s).  A referral was ordered for dermatology.

## 2016-01-27 NOTE — Progress Notes (Signed)
Subjective:    Patient ID: Lindsey Barrett, female    DOB: 02-12-1946, 70 y.o.   MRN: [id number]  HPI She is here for an acute visit.   She is going out the country - [place] in [place].  ? Compression socks:  She has varicose veins and they are sometimes mildly tender.  She wonders if she should wear compression socks on the airplane.    She would like to take a broad spectrum antibiotic with her.  She was told by the group she is going with that she needs to bring one.  Vaccines:  She wants to make sure they are all up to date.  She had flu vaccine today.  She is due for a Prevnar.  Her Tetanus is up to date.  She wonders about hepatitis A and B.   Skin evaluation:  She needs a new dermatologist.  Her prior one retired.   Travel advice.  She wonders what else she should bring with her or do for the trip.   Medications and allergies reviewed with patient and updated if appropriate.  Patient Active Problem List   Diagnosis Date Noted  . Prediabetes 08/10/2015  . 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[place]) 05/20/2015  . Open-angle glaucoma 06/17/2014  . Vitamin D deficiency 10/18/2011  . HSV infection   . Atrophic vaginitis   . VARICOSE VEINS, LOWER EXTREMITIES 09/28/2009  . HEADACHE 09/28/2009  . GERD 07/14/2009  . NONSPECIFIC ABNORMAL ELECTROCARDIOGRAM 07/14/2009  . Hyperglycemia 01/01/2008  . Hyperlipidemia 12/26/2006  . GILBERT'S SYNDROME 08/09/2006  . DEGENERATIVE DISC DISEASE 08/09/2006    Current Outpatient Prescriptions on File Prior to Visit  Medication Sig Dispense Refill  . aspirin 81 MG tablet Take 81 mg by mouth as directed.     . bimatoprost (LUMIGAN) 0.01 % SOLN Place 1 drop into both eyes at bedtime.    . brinzolamide (AZOPT) 1 % ophthalmic suspension 1 drop 2 (two) times daily.    . calcium carbonate (TUMS - DOSED IN MG ELEMENTAL CALCIUM) 500 MG chewable tablet Chew 1 tablet by mouth as needed for indigestion or heartburn.      . Ergocalciferol (VITAMIN D2 PO) Take 5,000 Units by mouth daily.     [name] ibuprofen (ADVIL,MOTRIN) 200 MG tablet Take 200 mg by mouth every 6 (six) hours as needed.    [name] letrozole (FEMARA) 2.5 MG tablet Take 1 tablet (2.5 mg total) by mouth daily. 90 tablet 3  . ranitidine (ZANTAC) 150 MG tablet Take 1 tablet (150 mg total) by mouth 2 (two) times daily. 60 tablet 0  . rosuvastatin (CRESTOR) 40 MG tablet Take 1 tablet (40 mg total) by mouth daily. 90 tablet 3  . [DISCONTINUED] metoprolol tartrate (LOPRESSOR) 25 MG tablet Take 1 tablet (25 mg total) by mouth as directed. 1 pill every 8 hours as needed for tachycardia or palpitations. 30 tablet 2   No current facility-administered medications on file prior to visit.     Past Medical History:  Diagnosis Date  . Anxiety   . Atrophic vaginitis   . Breast cancer ([place])   . 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[place]) 05/20/2015  . Breast cyst    x3  . DDD (degenerative disc disease)   . Diverticulosis   . Fasting hyperglycemia   . GERD (gastroesophageal reflux disease)   . Gilbert syndrome   . Glaucoma   . History of hiatal hernia   . HSV  infection   . Hyperlipidemia   . Radiation    left breast 50.4 gray  . STD (sexually transmitted disease)    HSV    Past Surgical History:  Procedure Laterality Date  . APPENDECTOMY  1961  . BREAST CYST ASPIRATION     x2 right ,x1 left  . COLONOSCOPY  2007   neg  . DILATION AND CURETTAGE OF UTERUS  1975  . PELVIC LAPAROSCOPY    . RADIOACTIVE SEED GUIDED MASTECTOMY WITH AXILLARY SENTINEL LYMPH NODE BIOPSY Left 05/31/2015   Procedure: LEFT BREAST RADIOACTIVE SEED GUIDED LUMPECTOMY WITH LEFT AXILLARY SENTINEL LYMPH NODE BIOPSY;  Surgeon: [name], MD;  Location: [facility];  Service: General;  Laterality: Left;  . RE-EXCISION OF BREAST LUMPECTOMY Left 07/01/2015   Procedure: RE-EXCISION OF LEFT BREAST INFERIOR MARGIN;  Surgeon: [name], MD;   Location: [facility];  Service: General;  Laterality: Left;  . UPPER GI ENDOSCOPY  10/2010   Dr [name]  . [place] EXTRACTION  1978    Social History   Social History  . Marital status: Divorced    Spouse name: N/A  . Number of children: N/A  . Years of education: N/A   Occupational History  . 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 Smoking status: Former Smoker    Types: Cigarettes    Quit date: 03/28/1999  . Smokeless tobacco: Never Used     Comment: smoked 1967-2000, up to 3/4 ppd  . Alcohol use Yes     Comment:  7/ week  . Drug use: No  . Sexual activity: No     Comment: 1st intercourse 70 yo-More than 5 partners   Other Topics Concern  . 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      Regular exercise-no    Family History  Problem Relation Age of Onset  . Lung cancer Mother     smoker  . Endometriosis Mother   . Cancer Mother     Bladder cancer  . Stomach cancer Maternal Grandmother   . Coronary artery disease Father   . Cancer Father     intestinal carcinoid tumors  . Sudden death Sister     80 week old, congenital deformity  . Liver cancer Maternal Uncle   . Stroke Paternal Grandfather 51  . Leukemia Maternal Uncle   . Breast cancer Paternal Aunt     great paternal aunt-Age 68's  . Colon cancer Paternal Aunt     lived to 55; pat great aunt  . Vascular Disease Paternal Grandmother     thoracic aneurysm  . Diabetes Neg Hx     Review of Systems     Objective:   Vitals:   01/27/16 1602  BP: 118/72  Pulse: 72  Resp: 16  Temp: 98.2 F (36.8 C)   Filed Weights   01/27/16 1602  Weight: 172 lb (78 kg)   Body mass index is 29.52 kg/m.   Physical Exam  He has several small superficial varicose veins in both legs, they are none tender     Assessment & Plan:   See Problem List for Assessment and Plan of chronic medical problems.  20  minutes were spent face-to-face with the patient, over 50% of which was spent counseling regarding her upcoming travel. We discussed over the counter medication she should take with her  including pepto, otc pain medication, allergy medication, etc.  We discussed antibiotics and I will give a prescription for augmentin.   Advised compression socks for the flight.   We reviewed her vaccines -flu vaccine today  She will return in a few weeks for a prevnar and hepatitis A vaccine

## 2016-01-27 NOTE — Progress Notes (Signed)
Pre visit review using our clinic review tool, if applicable. No additional management support is needed unless otherwise documented below in the visit note. 

## 2016-02-02 ENCOUNTER — Ambulatory Visit: Payer: Medicare Other | Admitting: Internal Medicine

## 2016-02-13 ENCOUNTER — Ambulatory Visit (AMBULATORY_SURGERY_CENTER): Payer: Medicare Other | Admitting: Gastroenterology

## 2016-02-13 ENCOUNTER — Encounter: Payer: Self-pay | Admitting: Gastroenterology

## 2016-02-13 VITALS — BP 133/56 | HR 63 | Temp 98.4°F | Resp 11 | Ht 64.0 in | Wt 172.0 lb

## 2016-02-13 DIAGNOSIS — Z1211 Encounter for screening for malignant neoplasm of colon: Secondary | ICD-10-CM

## 2016-02-13 DIAGNOSIS — K219 Gastro-esophageal reflux disease without esophagitis: Secondary | ICD-10-CM | POA: Diagnosis not present

## 2016-02-13 DIAGNOSIS — D123 Benign neoplasm of transverse colon: Secondary | ICD-10-CM | POA: Diagnosis not present

## 2016-02-13 DIAGNOSIS — Z1212 Encounter for screening for malignant neoplasm of rectum: Secondary | ICD-10-CM

## 2016-02-13 MED ORDER — SODIUM CHLORIDE 0.9 % IV SOLN: 500.0000 mL | INTRAVENOUS | Status: DC

## 2016-02-13 NOTE — Progress Notes (Signed)
Called to room to assist during endoscopic procedure.  Patient ID and intended procedure confirmed with present staff. Received instructions for my participation in the procedure from the performing physician.  

## 2016-02-13 NOTE — Progress Notes (Signed)
Pt. Arrived to recovery without I.V.,Recieved report that I.V. Was removed in the room because site started getting red. Removed tape dressing in recovery and pt.denies pain or discomfort at I.V. Site.

## 2016-02-13 NOTE — Patient Instructions (Signed)
YOU HAD AN ENDOSCOPIC PROCEDURE TODAY AT THE [facility]:   Refer to the procedure report that was given to you for any specific questions about what was found during the examination.  If the procedure report does not answer your questions, please call your gastroenterologist to clarify.  If you requested that your care partner not be given the details of your procedure findings, then the procedure report has been included in a sealed envelope for you to review at your convenience later.  YOU SHOULD EXPECT: Some feelings of bloating in the abdomen. Passage of more gas than usual.  Walking can help get rid of the air that was put into your GI tract during the procedure and reduce the bloating. If you had a lower endoscopy (such as a colonoscopy or flexible sigmoidoscopy) you may notice spotting of blood in your stool or on the toilet paper. If you underwent a bowel prep for your procedure, you may not have a normal bowel movement for a few days.  Please Note:  You might notice some irritation and congestion in your nose or some drainage.  This is from the oxygen used during your procedure.  There is no need for concern and it should clear up in a day or so.  SYMPTOMS TO REPORT IMMEDIATELY:   Following lower endoscopy (colonoscopy or flexible sigmoidoscopy):  Excessive amounts of blood in the stool  Significant tenderness or worsening of abdominal pains  Swelling of the abdomen that is new, acute  Fever of 100F or higher    For urgent or emergent issues, a gastroenterologist can be reached at any hour by calling [phone].   DIET:  We do recommend a small meal at first, but then you may proceed to your regular diet.  Drink plenty of fluids but you should avoid alcoholic beverages for 24 hours.  ACTIVITY:  You should plan to take it easy for the rest of today and you should NOT DRIVE or use heavy machinery until tomorrow (because of the sedation medicines used during the test).     FOLLOW UP: Our staff will call the number listed on your records the next business day following your procedure to check on you and address any questions or concerns that you may have regarding the information given to you following your procedure. If we do not reach you, we will leave a message.  However, if you are feeling well and you are not experiencing any problems, there is no need to return our call.  We will assume that you have returned to your regular daily activities without incident.  If any biopsies were taken you will be contacted by phone or by letter within the next 1-3 weeks.  Please call us at [phone] if you have not heard about the biopsies in 3 weeks.    SIGNATURES/CONFIDENTIALITY: You and/or your care partner have signed paperwork which will be entered into your electronic medical record.  These signatures attest to the fact that that the information above on your After Visit Summary has been reviewed and is understood.  Full responsibility of the confidentiality of this discharge information lies with you and/or your care-partner.   No ibuprofen,naproxen,or other non-steroidal anti-inflammatory drugs for 2 weeks,resume remainder of medications. Information given on polyps and diverticulosis. 

## 2016-02-13 NOTE — Progress Notes (Signed)
Report given to PACU RN, vss 

## 2016-02-13 NOTE — Op Note (Signed)
[facility] Patient Name: Lindsey Barrett Procedure Date: 02/13/2016 11:09 AM MRN: [id number] Endoscopist: Remo Lipps P. Havery Moros , MD Age: 70 Referring MD:  Date of Birth: 10/16/1945 Gender: Female Account #: [id number] Procedure:                Colonoscopy Indications:              Screening for malignant neoplasm in the colon Medicines:                Monitored Anesthesia Care Procedure:                Pre-Anesthesia Assessment:                           - Prior to the procedure, a History and Physical                            was performed, and patient medications and                            allergies were reviewed. The patient's tolerance of                            previous anesthesia was also reviewed. The risks                            and benefits of the procedure and the sedation                            options and risks were discussed with the patient.                            All questions were answered, and informed consent                            was obtained. Prior Anticoagulants: The patient has                            taken aspirin, last dose was 1 day prior to                            procedure. ASA Grade Assessment: II - A patient                            with mild systemic disease. After reviewing the                            risks and benefits, the patient was deemed in                            satisfactory condition to undergo the procedure.                           After obtaining informed consent, the colonoscope  was passed under direct vision. Throughout the                            procedure, the patient's blood pressure, pulse, and                            oxygen saturations were monitored continuously. The                            [id number]) was introduced through the anus                            and advanced to the the cecum, identified by   appendiceal orifice and ileocecal valve. The                            colonoscopy was performed without difficulty. The                            patient tolerated the procedure well. The quality                            of the bowel preparation was good. The ileocecal                            valve, appendiceal orifice, and rectum were                            photographed. Scope In: 11:29:15 AM Scope Out: 11:52:23 AM Scope Withdrawal Time: 0 hours 19 minutes 28 seconds  Total Procedure Duration: 0 hours 23 minutes 8 seconds  Findings:                 The perianal and digital rectal examinations were                            normal.                           A 5 mm polyp was found in the hepatic flexure. The                            polyp was sessile. The polyp was removed with a                            cold snare. Resection and retrieval were complete.                           Scattered medium-mouthed diverticula were found in                            the transverse colon and left colon.                           Anal papilla(e) were hypertrophied.  The exam was otherwise without abnormality. Complications:            No immediate complications. Estimated blood loss:                            Minimal. Estimated Blood Loss:     Estimated blood loss was minimal. Impression:               - One 5 mm polyp at the hepatic flexure, removed                            with a cold snare. Resected and retrieved.                           - Diverticulosis in the transverse colon and in the                            left colon.                           - Anal papilla(e) were hypertrophied.                           - The examination was otherwise normal. Recommendation:           - Patient has a contact number available for                            emergencies. The signs and symptoms of potential                            delayed complications  were discussed with the                            patient. Return to normal activities tomorrow.                            Written discharge instructions were provided to the                            patient.                           - Resume previous diet.                           - Continue present medications.                           - No ibuprofen, naproxen, or other non-steroidal                            anti-inflammatory drugs for 2 weeks after polyp                            removal.                           -  Await pathology results.                           - Repeat colonoscopy is recommended for                            surveillance. The colonoscopy date will be                            determined after pathology results from today's                            exam become available for review. Remo Lipps P. Armbruster, MD 02/13/2016 11:55:58 AM This report has been signed electronically.

## 2016-02-14 ENCOUNTER — Telehealth: Payer: Self-pay

## 2016-02-14 NOTE — Telephone Encounter (Signed)
  Follow up Call-  Call back number 02/13/2016  Post procedure Call Back phone  # [phone] hm  Permission to leave phone message Yes  Some recent data might be hidden     Patient questions:  Do you have a fever, pain , or abdominal swelling? No. Pain Score  0 *  Have you tolerated food without any problems? Yes.    Have you been able to return to your normal activities? Yes.    Do you have any questions about your discharge instructions: Diet   No. Medications  No. Follow up visit  No.  Do you have questions or concerns about your Care? No.  Actions: * If pain score is 4 or above: No action needed, pain <4.

## 2016-02-17 ENCOUNTER — Ambulatory Visit (INDEPENDENT_AMBULATORY_CARE_PROVIDER_SITE_OTHER): Payer: Medicare Other | Admitting: *Deleted

## 2016-02-17 DIAGNOSIS — Z23 Encounter for immunization: Secondary | ICD-10-CM | POA: Diagnosis not present

## 2016-02-18 ENCOUNTER — Encounter: Payer: Self-pay | Admitting: Gastroenterology

## 2016-02-27 DIAGNOSIS — H04123 Dry eye syndrome of bilateral lacrimal glands: Secondary | ICD-10-CM | POA: Diagnosis not present

## 2016-02-27 DIAGNOSIS — H2513 Age-related nuclear cataract, bilateral: Secondary | ICD-10-CM | POA: Diagnosis not present

## 2016-02-27 DIAGNOSIS — H401132 Primary open-angle glaucoma, bilateral, moderate stage: Secondary | ICD-10-CM | POA: Diagnosis not present

## 2016-03-01 ENCOUNTER — Encounter: Payer: Self-pay | Admitting: Hematology and Oncology

## 2016-03-01 ENCOUNTER — Ambulatory Visit (HOSPITAL_BASED_OUTPATIENT_CLINIC_OR_DEPARTMENT_OTHER): Payer: Medicare Other | Admitting: Hematology and Oncology

## 2016-03-01 DIAGNOSIS — C50212 Malignant neoplasm of upper-inner quadrant of left female breast: Secondary | ICD-10-CM

## 2016-03-01 DIAGNOSIS — Z79811 Long term (current) use of aromatase inhibitors: Secondary | ICD-10-CM

## 2016-03-01 DIAGNOSIS — Z170 Estrogen receptor positive status [ER+]: Secondary | ICD-10-CM

## 2016-03-01 MED ORDER — LETROZOLE 2.5 MG PO TABS
2.5000 mg | ORAL_TABLET | Freq: Every day | ORAL | 4 refills | Status: DC
Start: 2016-03-01 — End: 2016-08-29

## 2016-03-01 MED ORDER — LETROZOLE 2.5 MG PO TABS: 2.5000 mg | ORAL_TABLET | Freq: Every day | ORAL | 4 refills | Status: DC

## 2016-03-01 NOTE — Assessment & Plan Note (Signed)
Left lumpectomy 05/31/2015: Invasive ductal carcinoma grade 2, 1.5 cm, 0/3 lymph nodes negative, broadly involves inferior margin, ER 100%, PR 95%, HER-2 negative, Ki-67 5%, T1 cN0 stage IA, Oncotype DX score 18, 12% ROR, Reexcision of margins negative  Adjuvant radiation therapy with Dr. Sondra Come 08/10/15- 09/16/15  Current treatment: Anastrozole 1 mg daily started June1st 2017 stopped 10/17/2015 due to dizziness lightheadedness and fatigue, switched to letrozole starting 11/09/2015  Letrozole toxicities: 1. Intermittent hot flashes 2. Tender spots on the scalp Patient is willing to continue with letrozole. She does not have the dizziness that she had with anastrozole.  I encouraged her to increase her vitamin D to 5000 IUs daily.  Return to clinic in 6 months for follow-up

## 2016-03-01 NOTE — Progress Notes (Signed)
Patient Care Team: [name], MD as PCP - General (Internal Medicine) [name], MD as Consulting Physician (Gynecology)  DIAGNOSIS:  Encounter Diagnosis  Name Primary?  . Malignant neoplasm of upper-inner quadrant of left breast in female, estrogen receptor positive ([place])     SUMMARY OF ONCOLOGIC HISTORY:   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[place])   05/18/2015 Initial Diagnosis    Left breast biopsy: Invasive ductal carcinoma low to intermediate grade, ER 100%, PR 95%, HER-2 negative ratio 1.02, Ki-67 5%, left breast asymmetry 1.3 cm, T1c N0 stage IA      05/31/2015 Surgery    Left lumpectomy [name]): Invasive ductal carcinoma grade 2, 1.5 cm, 0/3 lymph nodes negative, broadly involves inferior margin, ER 100%, PR 95%, HER-2 negative duration 112, Ki-67 5%, T1 cN0 stage IA, Oncotype DX score 18, 12% ROR      05/31/2015 Oncotype testing    Oncotype DX score: 18/12% ROR      07/01/2015 Surgery    Reexcision of the left inferior margin: Fibrocystic changes with UDH      08/10/2015 - 09/16/2015 Radiation Therapy    Adj XRT ([facility]). 50.4 Gy in 28 fractions to the left breast      10/06/2015 -  Anti-estrogen oral therapy    Anastrozole 1 mg by mouth daily 5 years, switched to letrozole 11/09/2015 due to dizziness lightheadedness and fatigue       CHIEF COMPLIANT: Follow-up on letrozole  INTERVAL HISTORY: Lindsey Barrett is a 70 year old lady with above-mentioned history of left breast cancer treated with lumpectomy and radiation and is currently on antiestrogen therapy. She could not tolerate anastrozole but is tolerating letrozole. She continues to have hot flashes intermittently but does not have any further episodes of dizziness. She denies any lumps or nodules in breast. She complains of mild fullness in the breast related to lymphedema which she does manual drainage on massage.   REVIEW OF SYSTEMS:   Constitutional: Denies fevers,  chills or abnormal weight loss Eyes: Denies blurriness of vision Ears, nose, mouth, throat, and face: Denies mucositis or sore throat Respiratory: Denies cough, dyspnea or wheezes Cardiovascular: Denies palpitation, chest discomfort Gastrointestinal:  Denies nausea, heartburn or change in bowel habits Skin: Denies abnormal skin rashes Lymphatics: Denies new lymphadenopathy or easy bruising Neurological:Denies numbness, tingling or new weaknesses Behavioral/Psych: Mood is stable, no new changes  Extremities: No lower extremity edema Breast:  denies any pain or lumps or nodules in either breasts All other systems were reviewed with the patient and are negative.  I have reviewed the past medical history, past surgical history, social history and family history with the patient and they are unchanged from previous note.  ALLERGIES:  is allergic to septra ds [sulfamethoxazole-trimethoprim]; arimidex [anastrozole]; and adhesive [tape].  MEDICATIONS:  Current Outpatient Prescriptions  Medication Sig Dispense Refill  . amoxicillin-clavulanate (AUGMENTIN) 875-125 MG tablet Take 1 tablet by mouth 2 (two) times daily. (Patient not taking: Reported on 02/13/2016) 20 tablet 0  . aspirin 81 MG tablet Take 81 mg by mouth as directed.     . bimatoprost (LUMIGAN) 0.01 % SOLN Place 1 drop into both eyes at bedtime.    . brinzolamide (AZOPT) 1 % ophthalmic suspension 1 drop 2 (two) times daily.    . calcium carbonate (TUMS - DOSED IN MG ELEMENTAL CALCIUM) 500 MG chewable tablet Chew 1 tablet by mouth as needed for indigestion or heartburn.    . Ergocalciferol (VITAMIN D2 PO)  Take 5,000 Units by mouth daily.     [name] ibuprofen (ADVIL,MOTRIN) 200 MG tablet Take 200 mg by mouth every 6 (six) hours as needed.    [name] letrozole (FEMARA) 2.5 MG tablet Take 1 tablet (2.5 mg total) by mouth daily. 90 tablet 4  . ranitidine (ZANTAC) 150 MG tablet Take 1 tablet (150 mg total) by mouth 2 (two) times daily. 60 tablet 0  .  rosuvastatin (CRESTOR) 40 MG tablet Take 1 tablet (40 mg total) by mouth daily. 90 tablet 3   Current Facility-Administered Medications  Medication Dose Route Frequency Provider Last Rate Last Dose  . 0.9 %  sodium chloride infusion  500 mL Intravenous Continuous Manus Gunning, MD        PHYSICAL EXAMINATION: ECOG PERFORMANCE STATUS: 1 - Symptomatic but completely ambulatory  Vitals:   03/01/16 0840  BP: 118/61  Pulse: 68  Resp: 16  Temp: 98.3 F (36.8 C)   Filed Weights   03/01/16 0840  Weight: 170 lb 14.4 oz (77.5 kg)    GENERAL:alert, no distress and comfortable SKIN: skin color, texture, turgor are normal, no rashes or significant lesions EYES: normal, Conjunctiva are pink and non-injected, sclera clear OROPHARYNX:no exudate, no erythema and lips, buccal mucosa, and tongue normal  NECK: supple, thyroid normal size, non-tender, without nodularity LYMPH:  no palpable lymphadenopathy in the cervical, axillary or inguinal LUNGS: clear to auscultation and percussion with normal breathing effort HEART: regular rate & rhythm and no murmurs and no lower extremity edema ABDOMEN:abdomen soft, non-tender and normal bowel sounds MUSCULOSKELETAL:no cyanosis of digits and no clubbing  NEURO: alert & oriented x 3 with fluent speech, no focal motor/sensory deficits EXTREMITIES: No lower extremity edema BREAST: No palpable masses or nodules in either right or left breasts. No palpable axillary supraclavicular or infraclavicular adenopathy no breast tenderness or nipple discharge. (exam performed in the presence of a chaperone)  LABORATORY DATA:  I have reviewed the data as listed   Chemistry      Component Value Date/Time   NA 140 08/10/2015 0913   NA 142 05/25/2015 1229   K 4.2 08/10/2015 0913   K 4.4 05/25/2015 1229   CL 103 08/10/2015 0913   CO2 32 08/10/2015 0913   CO2 30 (H) 05/25/2015 1229   BUN 12 08/10/2015 0913   BUN 12.9 05/25/2015 1229   CREATININE 0.62  08/10/2015 0913   CREATININE 0.8 05/25/2015 1229      Component Value Date/Time   CALCIUM 9.4 08/10/2015 0913   CALCIUM 9.3 05/25/2015 1229   ALKPHOS 49 08/10/2015 0913   ALKPHOS 59 05/25/2015 1229   AST 25 08/10/2015 0913   AST 22 05/25/2015 1229   ALT 22 08/10/2015 0913   ALT 20 05/25/2015 1229   BILITOT 1.4 (H) 08/10/2015 0913   BILITOT 1.63 (H) 05/25/2015 1229       Lab Results  Component Value Date   WBC 6.0 08/10/2015   HGB 13.8 08/10/2015   HCT 41.4 08/10/2015   MCV 86.1 08/10/2015   PLT 224.0 08/10/2015   NEUTROABS 3.7 08/10/2015     ASSESSMENT & PLAN:  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[place]) Left lumpectomy 05/31/2015: Invasive ductal carcinoma grade 2, 1.5 cm, 0/3 lymph nodes negative, broadly involves inferior margin, ER 100%, PR 95%, HER-2 negative, Ki-67 5%, T1 cN0 stage IA, Oncotype DX score 18, 12% ROR, Reexcision of margins negative  Adjuvant radiation therapy with Dr. Sondra Come 08/10/15- 09/16/15  Current treatment: Anastrozole 1  mg daily started June1st 2017 stopped 10/17/2015 due to dizziness lightheadedness and fatigue, switched to letrozole starting 11/09/2015  Letrozole toxicities: 1. Intermittent hot flashes 2. Tender spots on the scalp Patient is willing to continue with letrozole. She does not have the dizziness that she had with anastrozole.  Breast Cancer Surveillance: 1. Breast exam 03/01/2016: Mild lymphedema but patient is doing manual drainage and it's working 2. Mammogram will need to be done in Jan 2018 Patient is planning to go to [place] in a couple weeks and is excited about it.  Currently on vitamin D to 5000 IUs daily.  Return to clinic in 6 months for follow-up  No orders of the defined types were placed in this encounter.  The patient has a good understanding of the overall plan. she agrees with it. she will call with any problems that may develop before the next visit here.   [name],  MD 03/01/16

## 2016-03-04 ENCOUNTER — Other Ambulatory Visit: Payer: Self-pay | Admitting: Gastroenterology

## 2016-03-06 ENCOUNTER — Other Ambulatory Visit: Payer: Self-pay

## 2016-03-06 MED ORDER — RANITIDINE HCL 150 MG PO TABS: 150.0000 mg | ORAL_TABLET | Freq: Two times a day (BID) | ORAL | 0 refills | Status: DC

## 2016-03-09 ENCOUNTER — Telehealth: Payer: Self-pay | Admitting: Emergency Medicine

## 2016-03-09 NOTE — Telephone Encounter (Signed)
Pt is leaving for vacation next week. She is going to be on a 10 hr flight and wants to know if she can get something for motion sickness. Please advise and call pt back thanks.

## 2016-03-12 NOTE — Telephone Encounter (Signed)
Please advise 

## 2016-03-12 NOTE — Telephone Encounter (Signed)
We can try scopolamine patch - she would put in behind her ear - often used for cruises  OR meclizne which is a pill, but may make her drowsy --- has she taken either before?

## 2016-03-12 NOTE — Telephone Encounter (Signed)
Spoke with pt, states she would like to just "wing it" and not have any motion sickness meds sent in. Will call back if she changes her mind

## 2016-04-22 ENCOUNTER — Other Ambulatory Visit: Payer: Self-pay | Admitting: Gastroenterology

## 2016-04-25 ENCOUNTER — Other Ambulatory Visit: Payer: Self-pay | Admitting: Gastroenterology

## 2016-04-25 ENCOUNTER — Telehealth: Payer: Self-pay | Admitting: Gastroenterology

## 2016-04-25 NOTE — Telephone Encounter (Signed)
Yes can refill it thanks

## 2016-04-25 NOTE — Telephone Encounter (Signed)
Pt has not been seen in over 1 year. She is requesting refill of Zantac. Is this ok to refill?

## 2016-05-01 ENCOUNTER — Telehealth: Payer: Self-pay | Admitting: Gastroenterology

## 2016-05-01 NOTE — Telephone Encounter (Signed)
To clarify, she had a pre-cancerous polyp removed from her colon, and recommendation was made for another colonoscopy 5 years from her last exam.   Otherwise okay to refill zantac. She can follow up as needed for any other issue. Thanks

## 2016-05-01 NOTE — Telephone Encounter (Signed)
Dr Havery Moros, Are you ok with refilling this pts Zantac. Her last office visit was 02/2015.

## 2016-05-02 ENCOUNTER — Other Ambulatory Visit: Payer: Self-pay

## 2016-05-02 MED ORDER — RANITIDINE HCL 150 MG PO TABS
150.0000 mg | ORAL_TABLET | Freq: Two times a day (BID) | ORAL | 1 refills | Status: DC
Start: 2016-05-02 — End: 2016-08-09

## 2016-05-02 NOTE — Telephone Encounter (Signed)
Pt informed and understood. Recall is 02/2021. Refill of Zantac 150mg  #60 with 1 refill sent (pt has just picked up 1 month supply)

## 2016-05-10 ENCOUNTER — Encounter: Payer: Self-pay | Admitting: Gynecology

## 2016-05-10 DIAGNOSIS — Z853 Personal history of malignant neoplasm of breast: Secondary | ICD-10-CM | POA: Diagnosis not present

## 2016-05-10 LAB — HM MAMMOGRAPHY

## 2016-05-22 ENCOUNTER — Encounter: Payer: Self-pay | Admitting: Geriatric Medicine

## 2016-05-22 NOTE — Progress Notes (Signed)
[facility] report received. Sent to scan. 

## 2016-05-29 ENCOUNTER — Telehealth: Payer: Self-pay | Admitting: Emergency Medicine

## 2016-05-29 ENCOUNTER — Encounter: Payer: Self-pay | Admitting: Internal Medicine

## 2016-05-29 DIAGNOSIS — F438 Other reactions to severe stress: Secondary | ICD-10-CM | POA: Insufficient documentation

## 2016-05-29 DIAGNOSIS — F4389 Other reactions to severe stress: Secondary | ICD-10-CM | POA: Insufficient documentation

## 2016-05-29 NOTE — Telephone Encounter (Signed)
FYI

## 2016-05-29 NOTE — Telephone Encounter (Signed)
Pt called and wanted to let you know that when she gets stressed her blood pressure goes up and she gets nose bleeds. She wanted to make sure it is documented in her chart. Yesterday she got a nose bleed around 515. She just wanted to make you aware. Thanks.

## 2016-05-29 NOTE — Telephone Encounter (Signed)
Noted in chart.

## 2016-06-22 DIAGNOSIS — H40053 Ocular hypertension, bilateral: Secondary | ICD-10-CM | POA: Diagnosis not present

## 2016-06-22 DIAGNOSIS — H53453 Other localized visual field defect, bilateral: Secondary | ICD-10-CM | POA: Diagnosis not present

## 2016-06-22 DIAGNOSIS — H2513 Age-related nuclear cataract, bilateral: Secondary | ICD-10-CM | POA: Diagnosis not present

## 2016-07-04 DIAGNOSIS — H53451 Other localized visual field defect, right eye: Secondary | ICD-10-CM | POA: Diagnosis not present

## 2016-07-15 ENCOUNTER — Telehealth: Payer: Self-pay

## 2016-07-15 NOTE — Telephone Encounter (Signed)
Spoke with patient and she is aware of her new appt date and time.  She had mentioned aches and pains that she feels she is having due to meds,she was offered a sooner appt but declined and she did not feel it was urgent,aware to call if she changes her mind  Lindsey Barrett

## 2016-08-09 ENCOUNTER — Other Ambulatory Visit: Payer: Self-pay | Admitting: Gastroenterology

## 2016-08-10 ENCOUNTER — Telehealth: Payer: Self-pay | Admitting: Gastroenterology

## 2016-08-10 NOTE — Telephone Encounter (Signed)
Rx sent 08/09/16. Pharmacy confirmed.

## 2016-08-16 ENCOUNTER — Ambulatory Visit: Payer: Medicare Other | Admitting: Hematology and Oncology

## 2016-08-22 ENCOUNTER — Telehealth: Payer: Self-pay | Admitting: Emergency Medicine

## 2016-08-22 ENCOUNTER — Telehealth: Payer: Self-pay | Admitting: *Deleted

## 2016-08-22 NOTE — Telephone Encounter (Signed)
Spoke with patient; she states that she will be out of the letrozole tablets the day after she sees Dr Lindi Adie on 4/25. Patient denies wanting a refill at this time. States she is having some symptoms and side effects on the letrozole and would like to discuss those with Dr Lindi Adie prior to continuing the medication. Advised patient to mention these issues with Dr Lindi Adie when she sees him in the office on 4/25.

## 2016-08-22 NOTE — Telephone Encounter (Signed)
FYI Returned patient's call requesting return call.  "I have a quick question.  I will need a refill  for letrozole.  I'll have one pill left when I see Dr Lindi Adie next week.  Could you put that bug in their ear."  Encouraged to notify provider during visit of this request.

## 2016-08-28 NOTE — Assessment & Plan Note (Signed)
Left lumpectomy 05/31/2015: Invasive ductal carcinoma grade 2, 1.5 cm, 0/3 lymph nodes negative, broadly involves inferior margin, ER 100%, PR 95%, HER-2 negative, Ki-67 5%, T1 cN0 stage IA, Oncotype DX score 18, 12% ROR, Reexcision of margins negative  Adjuvant radiation therapy with Dr. Sondra Come 08/10/15- 09/16/15  Current treatment: Anastrozole 1 mg daily started June1st 2017 stopped 10/17/2015 due to dizziness lightheadedness and fatigue, switched to letrozole starting 11/09/2015  Letrozole toxicities: 1. Intermittent hot flashes 2.Tender spots on the scalp Patient is willing to continue with letrozole. She does not have the dizziness that she had with anastrozole.  Breast Cancer Surveillance: 1. Breast exam 08/29/16: Mild lymphedema but patient is doing manual drainage and it's working 2. Mammogram Jan 2018  Patient is planning to go to [place] in a couple weeks and is excited about it.  Currently on vitamin D to 5000 IUs daily.  Return to clinic in 1 year for follow-up

## 2016-08-29 ENCOUNTER — Ambulatory Visit (HOSPITAL_BASED_OUTPATIENT_CLINIC_OR_DEPARTMENT_OTHER): Payer: Medicare Other | Admitting: Hematology and Oncology

## 2016-08-29 ENCOUNTER — Encounter: Payer: Self-pay | Admitting: Hematology and Oncology

## 2016-08-29 DIAGNOSIS — C50212 Malignant neoplasm of upper-inner quadrant of left female breast: Secondary | ICD-10-CM

## 2016-08-29 DIAGNOSIS — Z79811 Long term (current) use of aromatase inhibitors: Secondary | ICD-10-CM | POA: Diagnosis not present

## 2016-08-29 DIAGNOSIS — Z170 Estrogen receptor positive status [ER+]: Secondary | ICD-10-CM | POA: Diagnosis not present

## 2016-08-29 MED ORDER — LETROZOLE 2.5 MG PO TABS: 2.5000 mg | ORAL_TABLET | Freq: Every day | ORAL | 4 refills | Status: DC

## 2016-08-29 NOTE — Progress Notes (Signed)
Patient Care Team: [name], MD as PCP - General (Internal Medicine) [name], MD as Consulting Physician (Gynecology)  DIAGNOSIS:  Encounter Diagnosis  Name Primary?  . Malignant neoplasm of upper-inner quadrant of left breast in female, estrogen receptor positive ([place])     SUMMARY OF ONCOLOGIC HISTORY:   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[place])   05/18/2015 Initial Diagnosis    Left breast biopsy: Invasive ductal carcinoma low to intermediate grade, ER 100%, PR 95%, HER-2 negative ratio 1.02, Ki-67 5%, left breast asymmetry 1.3 cm, T1c N0 stage IA      05/31/2015 Surgery    Left lumpectomy [name]): Invasive ductal carcinoma grade 2, 1.5 cm, 0/3 lymph nodes negative, broadly involves inferior margin, ER 100%, PR 95%, HER-2 negative duration 112, Ki-67 5%, T1 cN0 stage IA, Oncotype DX score 18, 12% ROR      05/31/2015 Oncotype testing    Oncotype DX score: 18/12% ROR      07/01/2015 Surgery    Reexcision of the left inferior margin: Fibrocystic changes with UDH      08/10/2015 - 09/16/2015 Radiation Therapy    Adj XRT ([facility]). 50.4 Gy in 28 fractions to the left breast      10/06/2015 -  Anti-estrogen oral therapy    Anastrozole 1 mg by mouth daily 5 years, switched to letrozole 11/09/2015 due to dizziness lightheadedness and fatigue      CHIEF COMPLIANT: Complains of arthralgias and myalgias and hot flashes  INTERVAL HISTORY: Lindsey Barrett is a 71 year old with above-mentioned history left breast cancer treated with lumpectomy followed by radiation and is currently on antiestrogen therapy. She is tolerating letrozole moderately well. She continues to have hot flashes and arthralgias and myalgias. These have gotten worse lately especially since she has stopped exercising regularly.  REVIEW OF SYSTEMS:   Constitutional: Denies fevers, chills or abnormal weight loss Eyes: Denies blurriness of vision Ears, nose, mouth, throat,  and face: Denies mucositis or sore throat Respiratory: Denies cough, dyspnea or wheezes Cardiovascular: Denies palpitation, chest discomfort Gastrointestinal:  Denies nausea, heartburn or change in bowel habits Skin: Denies abnormal skin rashes Lymphatics: Denies new lymphadenopathy or easy bruising Neurological:Denies numbness, tingling or new weaknesses Behavioral/Psych: Mood is stable, no new changes  Extremities: No lower extremity edema Breast:  denies any pain or lumps or nodules in either breasts All other systems were reviewed with the patient and are negative.  I have reviewed the past medical history, past surgical history, social history and family history with the patient and they are unchanged from previous note.  ALLERGIES:  is allergic to septra ds [sulfamethoxazole-trimethoprim]; arimidex [anastrozole]; and adhesive [tape].  MEDICATIONS:  Current Outpatient Prescriptions  Medication Sig Dispense Refill  . amoxicillin-clavulanate (AUGMENTIN) 875-125 MG tablet Take 1 tablet by mouth 2 (two) times daily. (Patient not taking: Reported on 02/13/2016) 20 tablet 0  . aspirin 81 MG tablet Take 81 mg by mouth as directed.     . bimatoprost (LUMIGAN) 0.01 % SOLN Place 1 drop into both eyes at bedtime.    . brinzolamide (AZOPT) 1 % ophthalmic suspension 1 drop 2 (two) times daily.    . calcium carbonate (TUMS - DOSED IN MG ELEMENTAL CALCIUM) 500 MG chewable tablet Chew 1 tablet by mouth as needed for indigestion or heartburn.    . Ergocalciferol (VITAMIN D2 PO) Take 5,000 Units by mouth daily.     [name] ibuprofen (ADVIL,MOTRIN) 200 MG tablet Take 200 mg by mouth  every 6 (six) hours as needed.    [name] letrozole (FEMARA) 2.5 MG tablet Take 1 tablet (2.5 mg total) by mouth daily. 90 tablet 4  . ranitidine (ZANTAC) 150 MG tablet TAKE ONE TABLET BY MOUTH TWICE DAILY 60 tablet 1  . rosuvastatin (CRESTOR) 40 MG tablet Take 1 tablet (40 mg total) by mouth daily. 90 tablet 3   Current  Facility-Administered Medications  Medication Dose Route Frequency Provider Last Rate Last Dose  . 0.9 %  sodium chloride infusion  500 mL Intravenous Continuous Manus Gunning, MD        PHYSICAL EXAMINATION: ECOG PERFORMANCE STATUS: 1 - Symptomatic but completely ambulatory  Vitals:   08/29/16 0851  BP: 132/66  Pulse: 67  Resp: 17  Temp: 98.2 F (36.8 C)   Filed Weights   08/29/16 0851  Weight: 172 lb 14.4 oz (78.4 kg)    GENERAL:alert, no distress and comfortable SKIN: skin color, texture, turgor are normal, no rashes or significant lesions EYES: normal, Conjunctiva are pink and non-injected, sclera clear OROPHARYNX:no exudate, no erythema and lips, buccal mucosa, and tongue normal  NECK: supple, thyroid normal size, non-tender, without nodularity LYMPH:  no palpable lymphadenopathy in the cervical, axillary or inguinal LUNGS: clear to auscultation and percussion with normal breathing effort HEART: regular rate & rhythm and no murmurs and no lower extremity edema ABDOMEN:abdomen soft, non-tender and normal bowel sounds MUSCULOSKELETAL:no cyanosis of digits and no clubbing  NEURO: alert & oriented x 3 with fluent speech, no focal motor/sensory deficits EXTREMITIES: No lower extremity edema BREAST: No palpable masses or nodules in either right or left breasts. Firmness and tenderness in the left breast where she had prior radiation No palpable axillary supraclavicular or infraclavicular adenopathy no breast tenderness or nipple discharge. (exam performed in the presence of a chaperone)  LABORATORY DATA:  I have reviewed the data as listed   Chemistry      Component Value Date/Time   NA 140 08/10/2015 0913   NA 142 05/25/2015 1229   K 4.2 08/10/2015 0913   K 4.4 05/25/2015 1229   CL 103 08/10/2015 0913   CO2 32 08/10/2015 0913   CO2 30 (H) 05/25/2015 1229   BUN 12 08/10/2015 0913   BUN 12.9 05/25/2015 1229   CREATININE 0.62 08/10/2015 0913   CREATININE 0.8  05/25/2015 1229      Component Value Date/Time   CALCIUM 9.4 08/10/2015 0913   CALCIUM 9.3 05/25/2015 1229   ALKPHOS 49 08/10/2015 0913   ALKPHOS 59 05/25/2015 1229   AST 25 08/10/2015 0913   AST 22 05/25/2015 1229   ALT 22 08/10/2015 0913   ALT 20 05/25/2015 1229   BILITOT 1.4 (H) 08/10/2015 0913   BILITOT 1.63 (H) 05/25/2015 1229       Lab Results  Component Value Date   WBC 6.0 08/10/2015   HGB 13.8 08/10/2015   HCT 41.4 08/10/2015   MCV 86.1 08/10/2015   PLT 224.0 08/10/2015   NEUTROABS 3.7 08/10/2015    ASSESSMENT & PLAN:  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[place]) Left lumpectomy 05/31/2015: Invasive ductal carcinoma grade 2, 1.5 cm, 0/3 lymph nodes negative, broadly involves inferior margin, ER 100%, PR 95%, HER-2 negative, Ki-67 5%, T1 cN0 stage IA, Oncotype DX score 18, 12% ROR, Reexcision of margins negative  Adjuvant radiation therapy with Dr. Sondra Come 08/10/15- 09/16/15  Current treatment: Anastrozole 1 mg daily started June1st 2017 stopped 10/17/2015 due to dizziness lightheadedness and fatigue, switched to  letrozole starting 11/09/2015  Letrozole toxicities: 1. Intermittent hot flashes 2.Tender spots on the scalp Patient is willing to continue with letrozole. She does not have the dizziness that she had with anastrozole.  Breast Cancer Surveillance: 1. Breast exam 08/29/16: Mild lymphedema but patient is doing manual drainage and it's working 2. Mammogram Jan 2018  Patient went to [place] and had a wonderful time. We spent almost 30 minutes discussing the importance of physical activity and exercise in decreasing the side effects of aromatase inhibitor therapy. She especially has a hot flashes myalgias and arthralgias. Currently on vitamin D to 5000 IUs daily.  Return to clinic in 1 year for follow-up   I spent 25 minutes talking to the patient of which more than half was spent in counseling and coordination of care.  No orders  of the defined types were placed in this encounter.  The patient has a good understanding of the overall plan. she agrees with it. she will call with any problems that may develop before the next visit here.   [name], MD 08/29/16

## 2016-09-20 ENCOUNTER — Other Ambulatory Visit: Payer: Self-pay | Admitting: Internal Medicine

## 2016-10-04 ENCOUNTER — Other Ambulatory Visit: Payer: Self-pay | Admitting: Gastroenterology

## 2016-10-24 DIAGNOSIS — H40053 Ocular hypertension, bilateral: Secondary | ICD-10-CM | POA: Diagnosis not present

## 2016-10-24 DIAGNOSIS — H53453 Other localized visual field defect, bilateral: Secondary | ICD-10-CM | POA: Diagnosis not present

## 2016-10-24 DIAGNOSIS — H2513 Age-related nuclear cataract, bilateral: Secondary | ICD-10-CM | POA: Diagnosis not present

## 2016-10-26 ENCOUNTER — Ambulatory Visit (INDEPENDENT_AMBULATORY_CARE_PROVIDER_SITE_OTHER): Payer: Medicare Other | Admitting: Gastroenterology

## 2016-10-26 ENCOUNTER — Encounter: Payer: Self-pay | Admitting: Gastroenterology

## 2016-10-26 VITALS — BP 120/60 | HR 77 | Ht 64.0 in | Wt 169.0 lb

## 2016-10-26 DIAGNOSIS — R194 Change in bowel habit: Secondary | ICD-10-CM

## 2016-10-26 NOTE — Progress Notes (Signed)
Agree with assessment and plan as outlined.  

## 2016-10-26 NOTE — Progress Notes (Signed)
10/26/2016 Lindsey Barrett [id number] December 18, 1945   HISTORY OF PRESENT ILLNESS:  This is a very pleasant 71 year old female who is known to Dr. Havery Moros. Her last colonoscopy was in October 2017. She was found have one polyp that was removed and was a tubular adenoma on pathology. Also had diverticulosis.  She is here today with complaints of change in bowel habits. She tells me that she went on an 11 day trip to [place] where the diet was just totally different than what she is used to eating at home. She says that most things were cooked and a lot of oil and are very greasy with minimal amounts of vegetables, etc. She says that her schedule and routine were also significant thrown off with jet lag, having to get of very early every morning while on the trip, etc. She says that it did take a few days of her being back to her regular diet for things to straighten out, but it seems over the past 4 days her stools have become more formed and are returning to normal. She denies any abdominal pain or blood in her stools.    Past Medical History:  Diagnosis Date  . Anxiety   . Atrophic vaginitis   . Breast cancer ([place])   . 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[place]) 05/20/2015  . Breast cyst    x3  . DDD (degenerative disc disease)   . Diverticulosis   . Fasting hyperglycemia   . GERD (gastroesophageal reflux disease)   . Gilbert syndrome   . Glaucoma   . History of hiatal hernia   . HSV infection   . Hyperlipidemia   . Radiation    left breast 50.4 gray  . STD (sexually transmitted disease)    HSV   Past Surgical History:  Procedure Laterality Date  . APPENDECTOMY  1961  . BREAST CYST ASPIRATION     x2 right ,x1 left  . COLONOSCOPY  2007   neg  . DILATION AND CURETTAGE OF UTERUS  1975  . FLEXIBLE SIGMOIDOSCOPY     x2  . PELVIC LAPAROSCOPY    . RADIOACTIVE SEED GUIDED MASTECTOMY WITH AXILLARY SENTINEL LYMPH NODE BIOPSY Left 05/31/2015   Procedure:  LEFT BREAST RADIOACTIVE SEED GUIDED LUMPECTOMY WITH LEFT AXILLARY SENTINEL LYMPH NODE BIOPSY;  Surgeon: [name], MD;  Location: [facility];  Service: General;  Laterality: Left;  . RE-EXCISION OF BREAST LUMPECTOMY Left 07/01/2015   Procedure: RE-EXCISION OF LEFT BREAST INFERIOR MARGIN;  Surgeon: [name], MD;  Location: [facility];  Service: General;  Laterality: Left;  . UPPER GASTROINTESTINAL ENDOSCOPY    . UPPER GI ENDOSCOPY  10/2010   Dr [name]  . [place]    reports that she quit smoking about 17 years ago. Her smoking use included Cigarettes. She has never used smokeless tobacco. She reports that she drinks about 1.2 oz of alcohol per week . She reports that she does not use drugs. family history includes Breast cancer in her paternal aunt; Cancer in her father and mother; Colon cancer in her paternal aunt; Coronary artery disease in her father; Endometriosis in her mother; Leukemia in her maternal uncle; Liver cancer in her maternal uncle; Lung cancer in her mother; Stomach cancer in her maternal grandmother; Stroke (age of onset: 78) in her paternal grandfather; Sudden death in her sister; Vascular Disease in her paternal grandmother. Allergies  Allergen Reactions  . Septra  Ds [Sulfamethoxazole-Trimethoprim] Hives  . Arimidex [Anastrozole] Other (See Comments)    Unable to function  . Adhesive [Tape] Rash      Outpatient Encounter Prescriptions as of 10/26/2016  Medication Sig  . aspirin 81 MG tablet Take 81 mg by mouth as directed.   . bimatoprost (LUMIGAN) 0.01 % SOLN Place 1 drop into both eyes at bedtime.  . brinzolamide (AZOPT) 1 % ophthalmic suspension 1 drop 2 (two) times daily.  . calcium carbonate (TUMS - DOSED IN MG ELEMENTAL CALCIUM) 500 MG chewable tablet Chew 1 tablet by mouth as needed for indigestion or heartburn.  . Ergocalciferol (VITAMIN D2 PO) Take 5,000 Units by mouth daily.   [name] ibuprofen  (ADVIL,MOTRIN) 200 MG tablet Take 200 mg by mouth every 6 (six) hours as needed.  [name] letrozole (FEMARA) 2.5 MG tablet Take 1 tablet (2.5 mg total) by mouth daily.  . ranitidine (ZANTAC) 150 MG tablet TAKE 1 TABLET BY MOUTH TWICE DAILY  . rosuvastatin (CRESTOR) 40 MG tablet Take 1 tablet (40 mg total) by mouth daily. -- Office visit needed for further refills   Facility-Administered Encounter Medications as of 10/26/2016  Medication  . 0.9 %  sodium chloride infusion     REVIEW OF SYSTEMS  : All other systems reviewed and negative except where noted in the History of Present Illness.   PHYSICAL EXAM: BP 120/60   Pulse 77   Ht 5\' 4"  (1.626 m)   Wt 169 lb (76.7 kg)   BMI 29.01 kg/m  General: Well developed white female in no acute distress Head: Normocephalic and atraumatic Eyes:  Sclerae anicteric, conjunctiva pink. Ears: Normal auditory acuity Lungs: Clear throughout to auscultation; no increased WOB Heart: Regular rate and rhythm Abdomen: Soft, non-distended. Normal bowel sounds.  Non-tender. Musculoskeletal: Symmetrical with no gross deformities  Skin: No lesions on visible extremities Extremities: No edema  Neurological: Alert oriented x 4, grossly non-focal Psychological:  Alert and cooperative. Normal mood and affect  ASSESSMENT AND PLAN: -Change in bowel habits:  Likely from the change in diet and schedule/routine while she was in [place]. Already significantly improved and almost resolved over the past 3-4 days. I think she will just continue to improve with time and getting back to her normal diet, etc. I did suggest possibly starting a daily probiotic and/or a daily powder fiber supplement such as Citrucel or Metamucil to help replenish her gut bacteria and allow bulk to her stool. She will consider doing so.  Doubt any time of infectious process, but even if so it certainly seems to have resolved at this point.   CC:  [name], MD

## 2016-10-26 NOTE — Patient Instructions (Signed)
Consider probiotic or daily fiber powder such as Benefiber or Citrucel.

## 2016-12-07 ENCOUNTER — Encounter: Payer: Self-pay | Admitting: Gynecology

## 2016-12-07 ENCOUNTER — Ambulatory Visit (INDEPENDENT_AMBULATORY_CARE_PROVIDER_SITE_OTHER): Payer: Medicare Other | Admitting: Gynecology

## 2016-12-07 VITALS — BP 128/80 | Ht 64.0 in | Wt 170.0 lb

## 2016-12-07 DIAGNOSIS — N952 Postmenopausal atrophic vaginitis: Secondary | ICD-10-CM

## 2016-12-07 DIAGNOSIS — Z9189 Other specified personal risk factors, not elsewhere classified: Secondary | ICD-10-CM | POA: Diagnosis not present

## 2016-12-07 DIAGNOSIS — C50919 Malignant neoplasm of unspecified site of unspecified female breast: Secondary | ICD-10-CM

## 2016-12-07 DIAGNOSIS — Z01411 Encounter for gynecological examination (general) (routine) with abnormal findings: Secondary | ICD-10-CM

## 2016-12-07 NOTE — Progress Notes (Signed)
    Lindsey Barrett [birth year] [id number]        71 y.o.  G0P0 for breast and pelvic exam.  Past medical history,surgical history, problem list, medications, allergies, family history and social history were all reviewed and documented as reviewed in the EPIC chart.  ROS:  Performed with pertinent positives and negatives included in the history, assessment and plan.   Additional significant findings :  None   Exam: Lindsey Barrett assistant Vitals:   12/07/16 0824  BP: 128/80  Weight: 170 lb (77.1 kg)  Height: 5\' 4"  (1.626 m)   Body mass index is 29.18 kg/m.  General appearance:  Normal affect, orientation and appearance. Skin: Grossly normal HEENT: Without gross lesions.  No cervical or supraclavicular adenopathy. Thyroid normal.  Lungs:  Clear without wheezing, rales or rhonchi Cardiac: RR, without RMG Abdominal:  Soft, nontender, without masses, guarding, rebound, organomegaly or hernia Breasts:  Examined lying and sitting. Right without masses, retractions, discharge or axillary adenopathy.  Left with radiation changes but no masses, retractions, discharge or adenopathy. Pelvic:  Ext, BUS, Vagina: With atrophic changes  Cervix: With atrophic changes  Uterus: Anteverted, normal size, shape and contour, midline and mobile nontender   Adnexa: Without masses or tenderness    Anus and perineum: Normal   Rectovaginal: Normal sphincter tone without palpated masses or tenderness.    Assessment/Plan:  71 y.o. G0P0 female for breast and pelvic exam.   1. Postmenopausal/atrophic genital changes. Using Replens for vaginal dryness with good results. No significant hot flushes or night sweats. 2. History of breast cancer. Exam NED. Mammography 05/2016. Currently on aromatase inhibitor.  Continue follow up with her oncologist. 3. DEXA 2016 normal. Plan repeat DEXA next year at 3 year interval given her aromatase inhibitor treatment. Increase calcium vitamin D. 4. Colonoscopy 2017.  Repeat at their recommended interval. 5. Pap smear 2016. No Pap smear done today. Patient uncomfortable with stop screening per current screening guidelines based on age. Will plan Pap smear next year at 3 year interval. 6. Health maintenance. No routine lab work done as patient reports this done elsewhere. Follow up 1 year, sooner as needed.   Lindsey Auerbach MD, 8:55 AM 12/07/2016

## 2016-12-07 NOTE — Patient Instructions (Signed)
Follow up in one year for annual exam 

## 2016-12-10 ENCOUNTER — Telehealth: Payer: Self-pay | Admitting: Internal Medicine

## 2016-12-17 ENCOUNTER — Other Ambulatory Visit: Payer: Self-pay | Admitting: Internal Medicine

## 2016-12-27 DIAGNOSIS — H25013 Cortical age-related cataract, bilateral: Secondary | ICD-10-CM | POA: Diagnosis not present

## 2016-12-27 DIAGNOSIS — H4423 Degenerative myopia, bilateral: Secondary | ICD-10-CM | POA: Diagnosis not present

## 2016-12-27 DIAGNOSIS — H04123 Dry eye syndrome of bilateral lacrimal glands: Secondary | ICD-10-CM | POA: Diagnosis not present

## 2016-12-27 DIAGNOSIS — H15833 Staphyloma posticum, bilateral: Secondary | ICD-10-CM | POA: Diagnosis not present

## 2016-12-27 DIAGNOSIS — Z853 Personal history of malignant neoplasm of breast: Secondary | ICD-10-CM | POA: Diagnosis not present

## 2016-12-31 DIAGNOSIS — L851 Acquired keratosis [keratoderma] palmaris et plantaris: Secondary | ICD-10-CM | POA: Diagnosis not present

## 2016-12-31 DIAGNOSIS — D1801 Hemangioma of skin and subcutaneous tissue: Secondary | ICD-10-CM | POA: Diagnosis not present

## 2016-12-31 DIAGNOSIS — L821 Other seborrheic keratosis: Secondary | ICD-10-CM | POA: Diagnosis not present

## 2016-12-31 DIAGNOSIS — D225 Melanocytic nevi of trunk: Secondary | ICD-10-CM | POA: Diagnosis not present

## 2016-12-31 DIAGNOSIS — Z86018 Personal history of other benign neoplasm: Secondary | ICD-10-CM | POA: Diagnosis not present

## 2016-12-31 DIAGNOSIS — L814 Other melanin hyperpigmentation: Secondary | ICD-10-CM | POA: Diagnosis not present

## 2016-12-31 NOTE — Progress Notes (Signed)
Pre visit review using our clinic review tool, if applicable. No additional management support is needed unless otherwise documented below in the visit note. 

## 2016-12-31 NOTE — Progress Notes (Addendum)
Subjective:   Lindsey Barrett is a 71 y.o. female who presents for Medicare Annual (Subsequent) preventive examination.  Review of Systems:  No ROS.  Medicare Wellness Visit. Additional risk factors are reflected in the social history.  Cardiac Risk Factors include: advanced age (>86men, >38 women);hypertension Sleep patterns: feels rested on waking, gets up 1 times nightly to void and sleeps 6 hours nightly.  Patient reports insomnia issues, discussed recommended sleep tips and stress reduction tips, education was sent via [facility] Safety/Smoke Alarms: Feels safe in home. Smoke alarms in place.  Living environment; residence and Firearm Safety: 1-story house/ trailer, no firearms. Lives alone, no needs for DME, good support system Seat Belt Safety/Bike Helmet: Wears seat belt.     Objective:     Vitals: BP 118/74   Pulse 69   Temp 98.2 F (36.8 C)   Resp 20   Ht 5\' 4"  (1.626 m)   Wt 170 lb (77.1 kg)   SpO2 98%   BMI 29.18 kg/m   Body mass index is 29.18 kg/m.   Tobacco History  Smoking Status  . Former Smoker  . Types: Cigarettes  . Quit date: 03/28/1999  Smokeless Tobacco  . Never Used    Comment: smoked 1967-2000, up to 3/4 ppd     Counseling given: Not Answered   Past Medical History:  Diagnosis Date  . Anxiety   . Atrophic vaginitis   . Breast cancer ([place])   . 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[place]) 05/20/2015  . Breast cyst    x3  . DDD (degenerative disc disease)   . Diverticulosis   . Fasting hyperglycemia   . GERD (gastroesophageal reflux disease)   . Gilbert syndrome   . Glaucoma   . History of hiatal hernia   . HSV infection   . Hyperlipidemia   . Radiation    left breast 50.4 gray  . STD (sexually transmitted disease)    HSV   Past Surgical History:  Procedure Laterality Date  . APPENDECTOMY  1961  . BREAST CYST ASPIRATION     x2 right ,x1 left  . COLONOSCOPY  2007   neg  . DILATION AND CURETTAGE  OF UTERUS  1975  . FLEXIBLE SIGMOIDOSCOPY     x2  . PELVIC LAPAROSCOPY    . RADIOACTIVE SEED GUIDED MASTECTOMY WITH AXILLARY SENTINEL LYMPH NODE BIOPSY Left 05/31/2015   Procedure: LEFT BREAST RADIOACTIVE SEED GUIDED LUMPECTOMY WITH LEFT AXILLARY SENTINEL LYMPH NODE BIOPSY;  Surgeon: [name], MD;  Location: [facility];  Service: General;  Laterality: Left;  . RE-EXCISION OF BREAST LUMPECTOMY Left 07/01/2015   Procedure: RE-EXCISION OF LEFT BREAST INFERIOR MARGIN;  Surgeon: [name], MD;  Location: [facility];  Service: General;  Laterality: Left;  . UPPER GASTROINTESTINAL ENDOSCOPY    . UPPER GI ENDOSCOPY  10/2010   Dr [name]  . WISDOM TOOTH EXTRACTION  1978   Family History  Problem Relation Age of Onset  . Lung cancer Mother        smoker  . Endometriosis Mother   . Cancer Mother        Bladder cancer  . Stomach cancer Maternal Grandmother   . Coronary artery disease Father   . Cancer Father        intestinal carcinoid tumors  . Sudden death Sister        85 week old, congenital deformity  . Liver cancer Maternal Uncle   .  Stroke Paternal Grandfather 57  . Leukemia Maternal Uncle   . Breast cancer Paternal Aunt        great paternal aunt-Age 85's  . Colon cancer Paternal Aunt        lived to 78; pat great aunt  . Vascular Disease Paternal Grandmother        thoracic aneurysm  . Esophageal cancer Neg Hx   . Rectal cancer Neg Hx   . Pancreatic cancer Neg Hx    History  Sexual Activity  . Sexual activity: No    Comment: 1st intercourse 71 yo-More than 5 partners    Outpatient Encounter Prescriptions as of 01/01/2017  Medication Sig  . aspirin 81 MG tablet Take 81 mg by mouth as directed.   . bimatoprost (LUMIGAN) 0.01 % SOLN Place 1 drop into both eyes at bedtime.  . brinzolamide (AZOPT) 1 % ophthalmic suspension 1 drop 2 (two) times daily.  . calcium carbonate (TUMS - DOSED IN MG ELEMENTAL CALCIUM) 500 MG chewable tablet  Chew 1 tablet by mouth as needed for indigestion or heartburn.  . Ergocalciferol (VITAMIN D2 PO) Take 5,000 Units by mouth daily.   [name] ibuprofen (ADVIL,MOTRIN) 200 MG tablet Take 200 mg by mouth every 6 (six) hours as needed.  [name] letrozole (FEMARA) 2.5 MG tablet Take 1 tablet (2.5 mg total) by mouth daily.  . ranitidine (ZANTAC) 150 MG tablet TAKE 1 TABLET BY MOUTH TWICE DAILY  . rosuvastatin (CRESTOR) 40 MG tablet TAKE 1 TABLET BY MOUTH ONCE DAILY  . [DISCONTINUED] Vaginal Lubricant (REPLENS) GEL Place vaginally.  . [DISCONTINUED] 0.9 %  sodium chloride infusion    No facility-administered encounter medications on file as of 01/01/2017.     Activities of Daily Living In your present state of health, do you have any difficulty performing the following activities: 01/01/2017  Hearing? N  Vision? N  Difficulty concentrating or making decisions? N  Walking or climbing stairs? N  Dressing or bathing? N  Doing errands, shopping? N  Preparing Food and eating ? N  Using the Toilet? N  In the past six months, have you accidently leaked urine? N  Do you have problems with loss of bowel control? N  Managing your Medications? N  Managing your Finances? N  Housekeeping or managing your Housekeeping? N  Some recent data might be hidden    Patient Care Team: [name], MD as PCP - General (Internal Medicine) Phineas Real, Belinda Block, MD as Consulting Physician (Gynecology) Armbruster, Renelda Loma, MD as Consulting Physician (Gastroenterology) Calvert Cantor, MD as Consulting Physician (Ophthalmology)    Assessment:    Physical assessment deferred to PCP.  Exercise Activities and Dietary recommendations Current Exercise Habits: Home exercise routine, Type of exercise: walking;calisthenics;stretching, Time (Minutes): 30, Frequency (Times/Week): 3, Weekly Exercise (Minutes/Week): 90, Intensity: Mild, Exercise limited by: None identified  Diet (meal preparation, eat out, water intake, caffeinated  beverages, dairy products, fruits and vegetables): in general, a "healthy" diet  , well balanced, eats a variety of fruits and vegetables daily, limits salt, fat/cholesterol, sugar, caffeine, drinks 6-8 glasses of water daily.  Reviewed healthy diet, portion control, reading labels, weight loss tips, Relevant patient education assigned to patient using [facility].  Goals    . lose weight          Walk more at lunch, use my stationary bike, lift my home weights, go to the [facility] about once a week. Eat 2 pieces of chocolate instead of 4 .  Fall Risk Fall Risk  01/01/2017 10/27/2015 07/13/2015 06/10/2014 05/26/2013  Falls in the past year? No No No No No   Depression Screen PHQ 2/9 Scores 01/01/2017 10/27/2015 07/13/2015 06/10/2014  PHQ - 2 Score 0 0 0 0  PHQ- 9 Score 2 - - -  Exception Documentation - - - Patient refusal     Cognitive Function       Ad8 score reviewed for issues:  Issues making decisions: no  Less interest in hobbies / activities: no  Repeats questions, stories (family complaining): no  Trouble using ordinary gadgets (microwave, computer, phone):no  Forgets the month or year: no  Mismanaging finances: no  Remembering appts: no  Daily problems with thinking and/or memory: no Ad8 score is= 0    Immunization History  Administered Date(s) Administered  . Hepatitis A, Adult 02/17/2016  . Influenza Split 04/18/2011  . Influenza, High Dose Seasonal PF 05/26/2013, 01/27/2016  . Influenza, Seasonal, Injecte, Preservative Fre 04/17/2012  . Pneumococcal Conjugate-13 02/17/2016  . Pneumococcal Polysaccharide-23 07/02/2012  . Tdap 08/02/2010   Screening Tests Health Maintenance  Topic Date Due  . INFLUENZA VACCINE  12/05/2016  . DEXA SCAN  11/17/2017  . MAMMOGRAM  05/10/2018  . TETANUS/TDAP  08/01/2020  . COLONOSCOPY  02/12/2021  . Hepatitis C Screening  Completed  . PNA vac Low Risk Adult  Completed      Plan:    Continue doing brain stimulating activities  (puzzles, reading, adult coloring books, staying active) to keep memory sharp.   Continue to eat heart healthy diet (full of fruits, vegetables, whole grains, lean protein, water--limit salt, fat, and sugar intake) and increase physical activity as tolerated.  I have personally reviewed and noted the following in the patient's chart:   . Medical and social history . Use of alcohol, tobacco or illicit drugs  . Current medications and supplements . Functional ability and status . Nutritional status . Physical activity . Advanced directives . List of other physicians . Vitals . Screenings to include cognitive, depression, and falls . Referrals and appointments  In addition, I have reviewed and discussed with patient certain preventive protocols, quality metrics, and best practice recommendations. A written personalized care plan for preventive services as well as general preventive health recommendations were provided to patient.     Michiel Cowboy, RN  01/01/2017   Medical screening examination/treatment/procedure(s) were performed by non-physician practitioner and as supervising physician I was immediately available for consultation/collaboration. I agree with above. [name], MD

## 2017-01-01 ENCOUNTER — Encounter: Payer: Self-pay | Admitting: Internal Medicine

## 2017-01-01 ENCOUNTER — Ambulatory Visit (INDEPENDENT_AMBULATORY_CARE_PROVIDER_SITE_OTHER): Payer: Medicare Other | Admitting: Internal Medicine

## 2017-01-01 VITALS — BP 118/74 | HR 69 | Temp 98.2°F | Resp 20 | Ht 64.0 in | Wt 170.0 lb

## 2017-01-01 DIAGNOSIS — H9313 Tinnitus, bilateral: Secondary | ICD-10-CM | POA: Insufficient documentation

## 2017-01-01 DIAGNOSIS — E7800 Pure hypercholesterolemia, unspecified: Secondary | ICD-10-CM | POA: Diagnosis not present

## 2017-01-01 DIAGNOSIS — R7303 Prediabetes: Secondary | ICD-10-CM

## 2017-01-01 DIAGNOSIS — K219 Gastro-esophageal reflux disease without esophagitis: Secondary | ICD-10-CM

## 2017-01-01 DIAGNOSIS — Z0000 Encounter for general adult medical examination without abnormal findings: Secondary | ICD-10-CM

## 2017-01-01 NOTE — Assessment & Plan Note (Addendum)
Check lipid panel,cmp ,tsh Continue daily statin Regular exercise and healthy diet encouraged  

## 2017-01-01 NOTE — Patient Instructions (Addendum)
Continue doing brain stimulating activities (puzzles, reading, adult coloring books, staying active) to keep memory sharp.   Continue to eat heart healthy diet (full of fruits, vegetables, whole grains, lean protein, water--limit salt, fat, and sugar intake) and increase physical activity as tolerated.   Lindsey Barrett , Thank you for taking time to come for your Medicare Wellness Visit. I appreciate your ongoing commitment to your health goals. Please review the following plan we discussed and let me know if I can assist you in the future.   These are the goals we discussed: Goals    . lose weight          Walk more at lunch, use my stationary bike, lift my home weights, go to the [facility] about once a week. Eat 2 pieces of chocolate instead of 4 .        This is a list of the screening recommended for you and due dates:  Health Maintenance  Topic Date Due  . Flu Shot  12/05/2016  . DEXA scan (bone density measurement)  11/17/2017  . Mammogram  05/10/2018  . Tetanus Vaccine  08/01/2020  . Colon Cancer Screening  02/12/2021  .  Hepatitis C: One time screening is recommended by Center for Disease Control  (CDC) for  adults born from [birth year] through 1963.   Completed  . Pneumonia vaccines  Completed    Test(s) ordered today. Your results will be released to [place] (or called to you) after review, usually within 72hours after test completion. If any changes need to be made, you will be notified at that same time.  All other Health Maintenance issues reviewed.   All recommended immunizations and age-appropriate screenings are up-to-date or discussed.  No immunizations administered today.   Medications reviewed and updated.  No changes recommended at this time.   A referral was ordered for ENT   Please followup in one year

## 2017-01-01 NOTE — Assessment & Plan Note (Addendum)
Chronic, but has gotten worse No obvious hearing loss Excessive cerumen but not obstructing Will refer to ENT for further evaluation

## 2017-01-01 NOTE — Assessment & Plan Note (Signed)
GERD controlled Continue daily medication  

## 2017-01-01 NOTE — Assessment & Plan Note (Signed)
Check a1c Low sugar / carb diet Stressed regular exercise   

## 2017-01-01 NOTE — Progress Notes (Signed)
Subjective:    Patient ID: Lindsey Barrett, female    DOB: 1945-08-03, 71 y.o.   MRN: [id number]  HPI She is here for follow up.  Hyperlipidemia: She is taking her medication daily. She is compliant with a low fat/cholesterol diet. She is exercising regularly.   Prediabetes:  She is compliant with a low sugar/carbohydrate diet.  She is exercising regularly.  GERD:  She is taking her medication daily as prescribed.  She denies any GERD symptoms and feels her GERD is well controlled.   Ringing in ears:  It started about 1-2 years ago.  The ringing got worse recently.  She often gets a large amount of wax in her ears and thought that may be the problem.  She tried otc drops to remove wax and it does not seem to help.  She denies changes in her hearing or hearing loss.  She denies ear pain or pressure.    She has intermittent pain in her left temple.  It can be sharp.  She saw her eye doctor the other day and said that could be from dry eyes.  She is being treated for glaucoma.    The pain comes and goes quickly and goes away on its own. She denies blurry vision when it comes.    Medications and allergies reviewed with patient and updated if appropriate.  Patient Active Problem List   Diagnosis Date Noted  . Tinnitus of both ears 01/01/2017  . Change in bowel habits 10/26/2016  . Reaction to internal stress 05/29/2016  . Prediabetes 08/10/2015  . 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[place]) 05/20/2015  . Open-angle glaucoma 06/17/2014  . Vitamin D deficiency 10/18/2011  . HSV infection   . Atrophic vaginitis   . VARICOSE VEINS, LOWER EXTREMITIES 09/28/2009  . HEADACHE 09/28/2009  . GERD 07/14/2009  . NONSPECIFIC ABNORMAL ELECTROCARDIOGRAM 07/14/2009  . Hyperlipidemia 12/26/2006  . GILBERT'S SYNDROME 08/09/2006  . DEGENERATIVE DISC DISEASE 08/09/2006    Current Outpatient Prescriptions on File Prior to Visit  Medication Sig Dispense Refill  . aspirin  81 MG tablet Take 81 mg by mouth as directed.     . bimatoprost (LUMIGAN) 0.01 % SOLN Place 1 drop into both eyes at bedtime.    . brinzolamide (AZOPT) 1 % ophthalmic suspension 1 drop 2 (two) times daily.    . calcium carbonate (TUMS - DOSED IN MG ELEMENTAL CALCIUM) 500 MG chewable tablet Chew 1 tablet by mouth as needed for indigestion or heartburn.    . Ergocalciferol (VITAMIN D2 PO) Take 5,000 Units by mouth daily.     [name] ibuprofen (ADVIL,MOTRIN) 200 MG tablet Take 200 mg by mouth every 6 (six) hours as needed.    [name] letrozole (FEMARA) 2.5 MG tablet Take 1 tablet (2.5 mg total) by mouth daily. 90 tablet 4  . ranitidine (ZANTAC) 150 MG tablet TAKE 1 TABLET BY MOUTH TWICE DAILY 60 tablet 2  . rosuvastatin (CRESTOR) 40 MG tablet TAKE 1 TABLET BY MOUTH ONCE DAILY 90 tablet 0  . [DISCONTINUED] metoprolol tartrate (LOPRESSOR) 25 MG tablet Take 1 tablet (25 mg total) by mouth as directed. 1 pill every 8 hours as needed for tachycardia or palpitations. 30 tablet 2   No current facility-administered medications on file prior to visit.     Past Medical History:  Diagnosis Date  . Anxiety   . Atrophic vaginitis   . Breast cancer ([place])   . Breast cancer of upper-inner quadrant of  left female breast ([place]) 05/20/2015  . Breast cyst    x3  . DDD (degenerative disc disease)   . Diverticulosis   . Fasting hyperglycemia   . GERD (gastroesophageal reflux disease)   . Gilbert syndrome   . Glaucoma   . History of hiatal hernia   . HSV infection   . Hyperlipidemia   . Radiation    left breast 50.4 gray  . STD (sexually transmitted disease)    HSV    Past Surgical History:  Procedure Laterality Date  . APPENDECTOMY  1961  . BREAST CYST ASPIRATION     x2 right ,x1 left  . COLONOSCOPY  2007   neg  . DILATION AND CURETTAGE OF UTERUS  1975  . FLEXIBLE SIGMOIDOSCOPY     x2  . PELVIC LAPAROSCOPY    . RADIOACTIVE SEED GUIDED MASTECTOMY WITH AXILLARY SENTINEL LYMPH NODE BIOPSY Left 05/31/2015    Procedure: LEFT BREAST RADIOACTIVE SEED GUIDED LUMPECTOMY WITH LEFT AXILLARY SENTINEL LYMPH NODE BIOPSY;  Surgeon: [name], MD;  Location: [facility];  Service: General;  Laterality: Left;  . RE-EXCISION OF BREAST LUMPECTOMY Left 07/01/2015   Procedure: RE-EXCISION OF LEFT BREAST INFERIOR MARGIN;  Surgeon: [name], MD;  Location: [facility];  Service: General;  Laterality: Left;  . UPPER GASTROINTESTINAL ENDOSCOPY    . UPPER GI ENDOSCOPY  10/2010   Dr [name]  . [place] EXTRACTION  1978    Social History   Social History  . Marital status: Divorced    Spouse name: N/A  . Number of children: 0  . Years of education: N/A   Occupational History  . 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 Smoking status: Former Smoker    Types: Cigarettes    Quit date: 03/28/1999  . Smokeless tobacco: Never Used     Comment: smoked 1967-2000, up to 3/4 ppd  . Alcohol use 1.2 oz/week    2 Cans of beer per week  . Drug use: No  . Sexual activity: No     Comment: 1st intercourse 71 yo-More than 5 partners   Other Topics Concern  . 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      Regular exercise-no    Family History  Problem Relation Age of Onset  . Lung cancer Mother        smoker  . Endometriosis Mother   . Cancer Mother        Bladder cancer  . Stomach cancer Maternal Grandmother   . Coronary artery disease Father   . Cancer Father        intestinal carcinoid tumors  . Sudden death Sister        72 week old, congenital deformity  . Liver cancer Maternal Uncle   . Stroke Paternal Grandfather 23  . Leukemia Maternal Uncle   . Breast cancer Paternal Aunt        great paternal aunt-Age 20's  . Colon cancer Paternal Aunt        lived to 49; pat great aunt  . Vascular Disease Paternal Grandmother        thoracic aneurysm  . Esophageal cancer Neg Hx   . Rectal  cancer Neg Hx   . Pancreatic cancer Neg Hx     Review of Systems  Constitutional: Negative for chills and fever.  Hot flashes  Respiratory: Negative for cough, shortness of breath and wheezing.   Cardiovascular: Positive for palpitations (occasional with caffeine). Negative for chest pain and leg swelling.  Musculoskeletal: Positive for arthralgias.  Neurological: Positive for light-headedness (occ with sinus/ears) and headaches (ophthal migraines).       Objective:   Vitals:   01/01/17 1532  BP: 118/74  Pulse: 69  Resp: 20  Temp: 98.2 F (36.8 C)  SpO2: 98%   Filed Weights   01/01/17 1532  Weight: 170 lb (77.1 kg)   Body mass index is 29.18 kg/m.  Wt Readings from Last 3 Encounters:  01/01/17 170 lb (77.1 kg)  12/07/16 170 lb (77.1 kg)  10/26/16 169 lb (76.7 kg)     Physical Exam GENERAL APPEARANCE: Appears stated age, well appearing, NAD EYES: conjunctiva clear, no icterus HEENT: bilateral tympanic membranes and ear canals normal, oropharynx with mild erythema, no thyromegaly, trachea midline, no cervical or supraclavicular lymphadenopathy Cardiovascular: Normal rate, regular rhythm and normal heart sounds.   No murmur heard. No carotid bruit .  No edema Pulmonary/Chest: Effort normal and breath sounds normal. No respiratory distress. No has no wheezes. No rales.  Skin: Skin is warm and dry. Not diaphoretic.  Psychiatric: Normal mood and affect. Behavior is normal.         Assessment & Plan:   See Problem List for Assessment and Plan of chronic medical problems.

## 2017-01-04 ENCOUNTER — Other Ambulatory Visit (INDEPENDENT_AMBULATORY_CARE_PROVIDER_SITE_OTHER): Payer: Medicare Other

## 2017-01-04 DIAGNOSIS — E7800 Pure hypercholesterolemia, unspecified: Secondary | ICD-10-CM

## 2017-01-04 DIAGNOSIS — K219 Gastro-esophageal reflux disease without esophagitis: Secondary | ICD-10-CM

## 2017-01-04 DIAGNOSIS — R7303 Prediabetes: Secondary | ICD-10-CM

## 2017-01-04 LAB — COMPREHENSIVE METABOLIC PANEL
ALBUMIN: 4.4 g/dL (ref 3.5–5.2)
ALK PHOS: 53 U/L (ref 39–117)
ALT: 20 U/L (ref 0–35)
AST: 24 U/L (ref 0–37)
BILIRUBIN TOTAL: 1.4 mg/dL — AB (ref 0.2–1.2)
BUN: 14 mg/dL (ref 6–23)
CHLORIDE: 104 meq/L (ref 96–112)
CO2: 29 mEq/L (ref 19–32)
CREATININE: 0.74 mg/dL (ref 0.40–1.20)
Calcium: 9.6 mg/dL (ref 8.4–10.5)
GFR: 82.58 mL/min (ref >60.00)
Glucose, Bld: 99 mg/dL (ref 70–99)
Potassium: 4.3 mEq/L (ref 3.5–5.1)
Sodium: 141 mEq/L (ref 135–145)
TOTAL PROTEIN: 6.8 g/dL (ref 6.0–8.3)

## 2017-01-04 LAB — CBC WITH DIFFERENTIAL/PLATELET
BASOS ABS: 0 10*3/uL (ref 0.0–0.1)
BASOS PCT: 0.6 % (ref 0.0–3.0)
EOS ABS: 0.2 10*3/uL (ref 0.0–0.7)
Eosinophils Relative: 3.8 % (ref 0.0–5.0)
HCT: 40.8 % (ref 36.0–46.0)
HEMOGLOBIN: 13.5 g/dL (ref 12.0–15.0)
LYMPHS PCT: 30 % (ref 12.0–46.0)
Lymphs Abs: 1.6 10*3/uL (ref 0.7–4.0)
MCHC: 33 g/dL (ref 30.0–36.0)
MCV: 87.8 fl (ref 78.0–100.0)
MONO ABS: 0.6 10*3/uL (ref 0.1–1.0)
Monocytes Relative: 10.8 % (ref 3.0–12.0)
Neutro Abs: 2.9 10*3/uL (ref 1.4–7.7)
Neutrophils Relative %: 54.8 % (ref 43.0–77.0)
Platelets: 223 10*3/uL (ref 150.0–400.0)
RBC: 4.65 Mil/uL (ref 3.87–5.11)
RDW: 13.8 % (ref 11.5–15.5)
WBC: 5.2 10*3/uL (ref 4.0–10.5)

## 2017-01-04 LAB — LIPID PANEL
CHOL/HDL RATIO: 5
Cholesterol: 204 mg/dL — ABNORMAL HIGH (ref 0–200)
HDL: 45.3 mg/dL (ref >39.00)
LDL CALC: 140 mg/dL — AB (ref 0–99)
NONHDL: 158.84
Triglycerides: 96 mg/dL (ref 0.0–149.0)
VLDL: 19.2 mg/dL (ref 0.0–40.0)

## 2017-01-04 LAB — HEMOGLOBIN A1C: HEMOGLOBIN A1C: 6.1 % (ref 4.6–6.5)

## 2017-01-04 LAB — TSH: TSH: 2.24 u[IU]/mL (ref 0.35–4.50)

## 2017-01-06 ENCOUNTER — Encounter: Payer: Self-pay | Admitting: Internal Medicine

## 2017-01-18 ENCOUNTER — Other Ambulatory Visit: Payer: Self-pay | Admitting: Gastroenterology

## 2017-02-20 DIAGNOSIS — H53453 Other localized visual field defect, bilateral: Secondary | ICD-10-CM | POA: Diagnosis not present

## 2017-02-20 DIAGNOSIS — H52223 Regular astigmatism, bilateral: Secondary | ICD-10-CM | POA: Diagnosis not present

## 2017-02-20 DIAGNOSIS — H524 Presbyopia: Secondary | ICD-10-CM | POA: Diagnosis not present

## 2017-02-20 DIAGNOSIS — H40053 Ocular hypertension, bilateral: Secondary | ICD-10-CM | POA: Diagnosis not present

## 2017-02-20 DIAGNOSIS — H2513 Age-related nuclear cataract, bilateral: Secondary | ICD-10-CM | POA: Diagnosis not present

## 2017-02-25 NOTE — Progress Notes (Signed)
Subjective:    Patient ID: Lindsey Barrett, female    DOB: 1946-02-25, 71 y.o.   MRN: [id number]  HPI She is here for an acute visit.   ? UTI:  She has only had one UTI and it was 40 years ago.  She sees Dr Phineas Real and has atrophic vaginitis.  She uses Replens three times a week but is not consistent with it.  She denies urinary frequency.  She has some pulsing sensation in her bladder at the end of urination - like it is trying to get more urine out.  There is a mild discharge and she is not sure if it is from the vagina or urethra.  It is better today.  Her urine is clear.  She denies dysuria.  She does have some vaginal itching.  She is unsure if she has a urinary tract infection, yeast infection or if her symptoms are related to her atrophic vaginitis.  Painful lymph node under arm pit:  One month ago she noticed a tender lymph node in her left axilla.  The pain radiates down her left arm.  She denies breast pain or changes.  Lifting her left arm pulls and causes mild pain in the left axilla.  There has been no change over the past month so she wanted to have this evaluated.  She does have a history of breast cancer in the left breast.  She denies any changes in the left breast, but does not do breast exams regularly.  Scar pain:  The other day her scar on her abdomen and her right lower quadrant was bright red and looked like it was gong to bust open.  She noticed this after taking a shower and initially thought she scrubbed the area too much.  Knot on back of head: The other day she noticed a knot on the back of her head and it was sore to touch.  She did have a friend look at it and it was described as being red and having a head like a pimple.  That night she washed her hair vigorously and she thinks she broke it because it did not go away, but the area is still a little sensitive.  She wanted to have it looked at to make sure it was not anything concerning, such as a tick bite or  infection.     Medications and allergies reviewed with patient and updated if appropriate.  Patient Active Problem List   Diagnosis Date Noted  . Tinnitus of both ears 01/01/2017  . Change in bowel habits 10/26/2016  . Reaction to internal stress 05/29/2016  . Prediabetes 08/10/2015  . 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[facility]) 05/20/2015  . Open-angle glaucoma 06/17/2014  . Vitamin D deficiency 10/18/2011  . HSV infection   . Atrophic vaginitis   . VARICOSE VEINS, LOWER EXTREMITIES 09/28/2009  . HEADACHE 09/28/2009  . GERD 07/14/2009  . NONSPECIFIC ABNORMAL ELECTROCARDIOGRAM 07/14/2009  . Hyperlipidemia 12/26/2006  . GILBERT'S SYNDROME 08/09/2006  . DEGENERATIVE DISC DISEASE 08/09/2006    Current Outpatient Prescriptions on File Prior to Visit  Medication Sig Dispense Refill  . aspirin 81 MG tablet Take 81 mg by mouth as directed.     . bimatoprost (LUMIGAN) 0.01 % SOLN Place 1 drop into both eyes at bedtime.    . brinzolamide (AZOPT) 1 % ophthalmic suspension 1 drop 2 (two) times daily.    . calcium carbonate (TUMS - DOSED IN MG ELEMENTAL  CALCIUM) 500 MG chewable tablet Chew 1 tablet by mouth as needed for indigestion or heartburn.    . Ergocalciferol (VITAMIN D2 PO) Take 5,000 Units by mouth daily.     [name] ibuprofen (ADVIL,MOTRIN) 200 MG tablet Take 200 mg by mouth every 6 (six) hours as needed.    [name] letrozole (FEMARA) 2.5 MG tablet Take 1 tablet (2.5 mg total) by mouth daily. 90 tablet 4  . ranitidine (ZANTAC) 150 MG tablet TAKE 1 TABLET BY MOUTH TWICE DAILY 60 tablet 2  . rosuvastatin (CRESTOR) 40 MG tablet TAKE 1 TABLET BY MOUTH ONCE DAILY 90 tablet 0  . [DISCONTINUED] metoprolol tartrate (LOPRESSOR) 25 MG tablet Take 1 tablet (25 mg total) by mouth as directed. 1 pill every 8 hours as needed for tachycardia or palpitations. 30 tablet 2   No current facility-administered medications on file prior to visit.     Past Medical History:  Diagnosis Date    . Anxiety   . Atrophic vaginitis   . Breast cancer ([place])   . 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[place]) 05/20/2015  . Breast cyst    x3  . DDD (degenerative disc disease)   . Diverticulosis   . Fasting hyperglycemia   . GERD (gastroesophageal reflux disease)   . Gilbert syndrome   . Glaucoma   . History of hiatal hernia   . HSV infection   . Hyperlipidemia   . Radiation    left breast 50.4 gray  . STD (sexually transmitted disease)    HSV    Past Surgical History:  Procedure Laterality Date  . APPENDECTOMY  1961  . BREAST CYST ASPIRATION     x2 right ,x1 left  . COLONOSCOPY  2007   neg  . DILATION AND CURETTAGE OF UTERUS  1975  . FLEXIBLE SIGMOIDOSCOPY     x2  . PELVIC LAPAROSCOPY    . RADIOACTIVE SEED GUIDED MASTECTOMY WITH AXILLARY SENTINEL LYMPH NODE BIOPSY Left 05/31/2015   Procedure: LEFT BREAST RADIOACTIVE SEED GUIDED LUMPECTOMY WITH LEFT AXILLARY SENTINEL LYMPH NODE BIOPSY;  Surgeon: [name], MD;  Location: [facility];  Service: General;  Laterality: Left;  . RE-EXCISION OF BREAST LUMPECTOMY Left 07/01/2015   Procedure: RE-EXCISION OF LEFT BREAST INFERIOR MARGIN;  Surgeon: [name], MD;  Location: [facility];  Service: General;  Laterality: Left;  . UPPER GASTROINTESTINAL ENDOSCOPY    . UPPER GI ENDOSCOPY  10/2010   Dr [name]  . [place] EXTRACTION  1978    Social History   Social History  . Marital status: Divorced    Spouse name: N/A  . Number of children: 0  . Years of education: N/A   Occupational History  . 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 Smoking status: Former Smoker    Types: Cigarettes    Quit date: 03/28/1999  . Smokeless tobacco: Never Used     Comment: smoked 1967-2000, up to 3/4 ppd  . Alcohol use 1.2 oz/week    2 Cans of beer per week  . Drug use: No  . Sexual activity: No     Comment: 1st intercourse 71 yo-More than 5 partners    Other Topics Concern  . 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      Regular exercise-no    Family History  Problem Relation Age of Onset  .  Lung cancer Mother        smoker  . Endometriosis Mother   . Cancer Mother        Bladder cancer  . Stomach cancer Maternal Grandmother   . Coronary artery disease Father   . Cancer Father        intestinal carcinoid tumors  . Sudden death Sister        55 week old, congenital deformity  . Liver cancer Maternal Uncle   . Stroke Paternal Grandfather 24  . Leukemia Maternal Uncle   . Breast cancer Paternal Aunt        great paternal aunt-Age 15's  . Colon cancer Paternal Aunt        lived to 3; pat great aunt  . Vascular Disease Paternal Grandmother        thoracic aneurysm  . Esophageal cancer Neg Hx   . Rectal cancer Neg Hx   . Pancreatic cancer Neg Hx     Review of Systems  Constitutional: Negative for chills and fever.       Hot/cold  Gastrointestinal: Positive for nausea (mild, today - ? ate too much and too quick). Negative for abdominal pain.  Genitourinary: Negative for dysuria, frequency and hematuria.       Objective:   Vitals:   02/26/17 1023  BP: (!) 154/62  Pulse: 82  Resp: 16  Temp: 98.3 F (36.8 C)  SpO2: 97%   Filed Weights   02/26/17 1023  Weight: 172 lb (78 kg)   Body mass index is 29.52 kg/m.  Wt Readings from Last 3 Encounters:  02/26/17 172 lb (78 kg)  01/01/17 170 lb (77.1 kg)  12/07/16 170 lb (77.1 kg)     Physical Exam  Constitutional: She appears well-developed and well-nourished. No distress.  HENT:  Head: Normocephalic and atraumatic.  Pulmonary/Chest:  Bilateral breast exam normal shows no concerns.  Status post surgery of left breast.  No skin changes bilaterally.  Both breasts are fibrous.  No palpable lumps.  Left axilla with tender lymph nodes, but right axilla has palpable lymph nodes that are not tender  Abdominal:  Soft. She exhibits no distension and no mass. There is no tenderness. There is no rebound and no guarding.  Musculoskeletal: She exhibits no edema.  Skin: She is not diaphoretic.  Scar from prior surgery in the right lower quadrant of abdomen well-healed, no concerning swelling or erythema, scar itself has a pink color, but appears normal, area is nontender; scalp shows a mild area of tenderness posterior left head where she may have had a pimple, but appears to be resolved-reassured  Psychiatric: She has a normal mood and affect. Her behavior is normal.          Assessment & Plan:   See Problem List for Assessment and Plan of chronic medical problems.

## 2017-02-25 NOTE — Patient Instructions (Addendum)
  A Breast ultrasound was ordered.  We may need to order a mammogram as well.  Someone will call you to schedule this.   Flu immunization administered today.    Medications reviewed and updated. No changes recommended at this time.   If your bladder symptoms do not improve or change let us know.

## 2017-02-26 ENCOUNTER — Other Ambulatory Visit: Payer: Medicare Other

## 2017-02-26 ENCOUNTER — Ambulatory Visit (INDEPENDENT_AMBULATORY_CARE_PROVIDER_SITE_OTHER): Payer: Medicare Other | Admitting: Internal Medicine

## 2017-02-26 ENCOUNTER — Encounter: Payer: Self-pay | Admitting: Internal Medicine

## 2017-02-26 VITALS — BP 154/62 | HR 82 | Temp 98.3°F | Resp 16 | Wt 172.0 lb

## 2017-02-26 DIAGNOSIS — R8290 Unspecified abnormal findings in urine: Secondary | ICD-10-CM

## 2017-02-26 DIAGNOSIS — R590 Localized enlarged lymph nodes: Secondary | ICD-10-CM | POA: Diagnosis not present

## 2017-02-26 DIAGNOSIS — Z23 Encounter for immunization: Secondary | ICD-10-CM | POA: Diagnosis not present

## 2017-02-26 DIAGNOSIS — L989 Disorder of the skin and subcutaneous tissue, unspecified: Secondary | ICD-10-CM | POA: Diagnosis not present

## 2017-02-26 LAB — POCT URINALYSIS DIPSTICK
Bilirubin, UA: NEGATIVE
Glucose, UA: NEGATIVE
Ketones, UA: NEGATIVE
LEUKOCYTES UA: NEGATIVE
NITRITE UA: NEGATIVE
PH UA: 6 (ref 5.0–8.0)
PROTEIN UA: NEGATIVE
RBC UA: NEGATIVE
Spec Grav, UA: 1.01 (ref 1.010–1.025)
UROBILINOGEN UA: 0.2 U/dL

## 2017-02-26 NOTE — Assessment & Plan Note (Signed)
Resolved, small area of increased sensitivity, but no concerning findings Reassured She will monitor

## 2017-02-26 NOTE — Assessment & Plan Note (Signed)
Tender lymphadenopathy in left axilla Given her history of left-sided breast cancer and persistent tenderness in the left axilla for 1 month further evaluation is needed Stat ultrasound of left axilla Further evaluation based on ultrasound

## 2017-02-26 NOTE — Assessment & Plan Note (Signed)
Urinary symptoms difficult to explain, but have improved today Urine dip here negative Will send for culture She will continue to monitor her symptoms Her current symptoms may be related to recently using the vaginal cream

## 2017-02-27 ENCOUNTER — Ambulatory Visit: Payer: Medicare Other | Admitting: Internal Medicine

## 2017-02-27 ENCOUNTER — Telehealth: Payer: Self-pay | Admitting: Internal Medicine

## 2017-02-27 NOTE — Telephone Encounter (Signed)
Patient states she is feeling bad this morning. She has discomfort in the vaginal area. She is starts her bladder feels like it has a pluses. She is having frequent urination. She wanted to know if she needs to come back in or go see her GYN. Please advise. Thank you.

## 2017-02-27 NOTE — Telephone Encounter (Signed)
Spoke with pt, she would like to wait on the culture. States she would like to be called about her results rather than having them sent through [facility].

## 2017-02-27 NOTE — Telephone Encounter (Signed)
Her culture is still pending.  We can empirically start an antibiotic if she wants in case it is a possible urinary tract infection.  Let me know and I will send something to her pharmacy.

## 2017-02-28 ENCOUNTER — Telehealth: Payer: Self-pay | Admitting: Emergency Medicine

## 2017-02-28 ENCOUNTER — Other Ambulatory Visit: Payer: Self-pay | Admitting: Internal Medicine

## 2017-02-28 MED ORDER — NITROFURANTOIN MONOHYD MACRO 100 MG PO CAPS: 100.0000 mg | ORAL_CAPSULE | Freq: Two times a day (BID) | ORAL | 0 refills | Status: DC

## 2017-02-28 NOTE — Telephone Encounter (Signed)
RX sent to [facility] bc [facility] was out of [place].

## 2017-03-01 ENCOUNTER — Telehealth: Payer: Self-pay | Admitting: *Deleted

## 2017-03-01 LAB — URINE CULTURE
MICRO NUMBER: 81184456
SPECIMEN QUALITY:: ADEQUATE

## 2017-03-01 NOTE — Telephone Encounter (Signed)
Per second message  - Gaile states U/S has been scheduled for 11/6 and is inquiring " is that ok ?"  This RN contacted [facility] and obtained an appointment for 10/30 at 945.  Called and discussed with Itzy.   She appreciated above- no further needs at this time.

## 2017-03-01 NOTE — Telephone Encounter (Signed)
This RN spoke with pt per her call stating onset of left axillary discomfort " right in the middle where they removed lymphnodes"  She states she has had intermittant mild pain since surgery but then approximately 1 month ago pain became more consistent now impairing movement " and pain extending down my arm "  Lindsey Barrett denies any swelling or increased warmth.  Due to surgical side being on left - this RN verified negative cardiac concerns with pt denying any chest pain, cold sweats or SOB.  She did state she was seen this past Tuesday by her primary MD due to an UTI - who assessed above concerned- " she felt and pressed on the whole area and though she could not feel an abnormality has set an order to [facility] for an U/S"  Lindsey Barrett wanted to inform Dr Lindi Adie of above as well as to verify plan is appropriate.  This RN informed her above is what this office would do - she should call [facility] to schedule the U/S asap - and if negative plan would be for her to proceed to the lymphedema clinic due to possible. Phone number for [facility] given as well as to call this office again if any issues related to the scheduling of the U/S.  Lindsey Barrett verbalized appreciation of above discussion- she will contact [facility] as well as let this office know of outcome for further recommendations.

## 2017-03-05 ENCOUNTER — Encounter: Payer: Self-pay | Admitting: Hematology and Oncology

## 2017-03-05 DIAGNOSIS — R922 Inconclusive mammogram: Secondary | ICD-10-CM | POA: Diagnosis not present

## 2017-03-05 DIAGNOSIS — N644 Mastodynia: Secondary | ICD-10-CM | POA: Diagnosis not present

## 2017-03-05 LAB — HM MAMMOGRAPHY

## 2017-03-06 ENCOUNTER — Encounter: Payer: Self-pay | Admitting: Internal Medicine

## 2017-03-06 DIAGNOSIS — H6123 Impacted cerumen, bilateral: Secondary | ICD-10-CM | POA: Diagnosis not present

## 2017-03-06 DIAGNOSIS — H903 Sensorineural hearing loss, bilateral: Secondary | ICD-10-CM | POA: Diagnosis not present

## 2017-03-06 DIAGNOSIS — H9313 Tinnitus, bilateral: Secondary | ICD-10-CM | POA: Diagnosis not present

## 2017-03-12 ENCOUNTER — Encounter: Payer: Self-pay | Admitting: Internal Medicine

## 2017-03-29 ENCOUNTER — Other Ambulatory Visit: Payer: Self-pay | Admitting: Internal Medicine

## 2017-04-03 IMAGING — CR DG FOOT COMPLETE 3+V*R*
3 series · 3 of 3 positions shown · non-contrast
Comparison: None.

CLINICAL DATA: Right foot painful to touch anteriorly with
intermittent swelling 3 weeks. No injury.

EXAM:
RIGHT FOOT COMPLETE - 3+ VIEW

[view not recorded (1 of 3)]
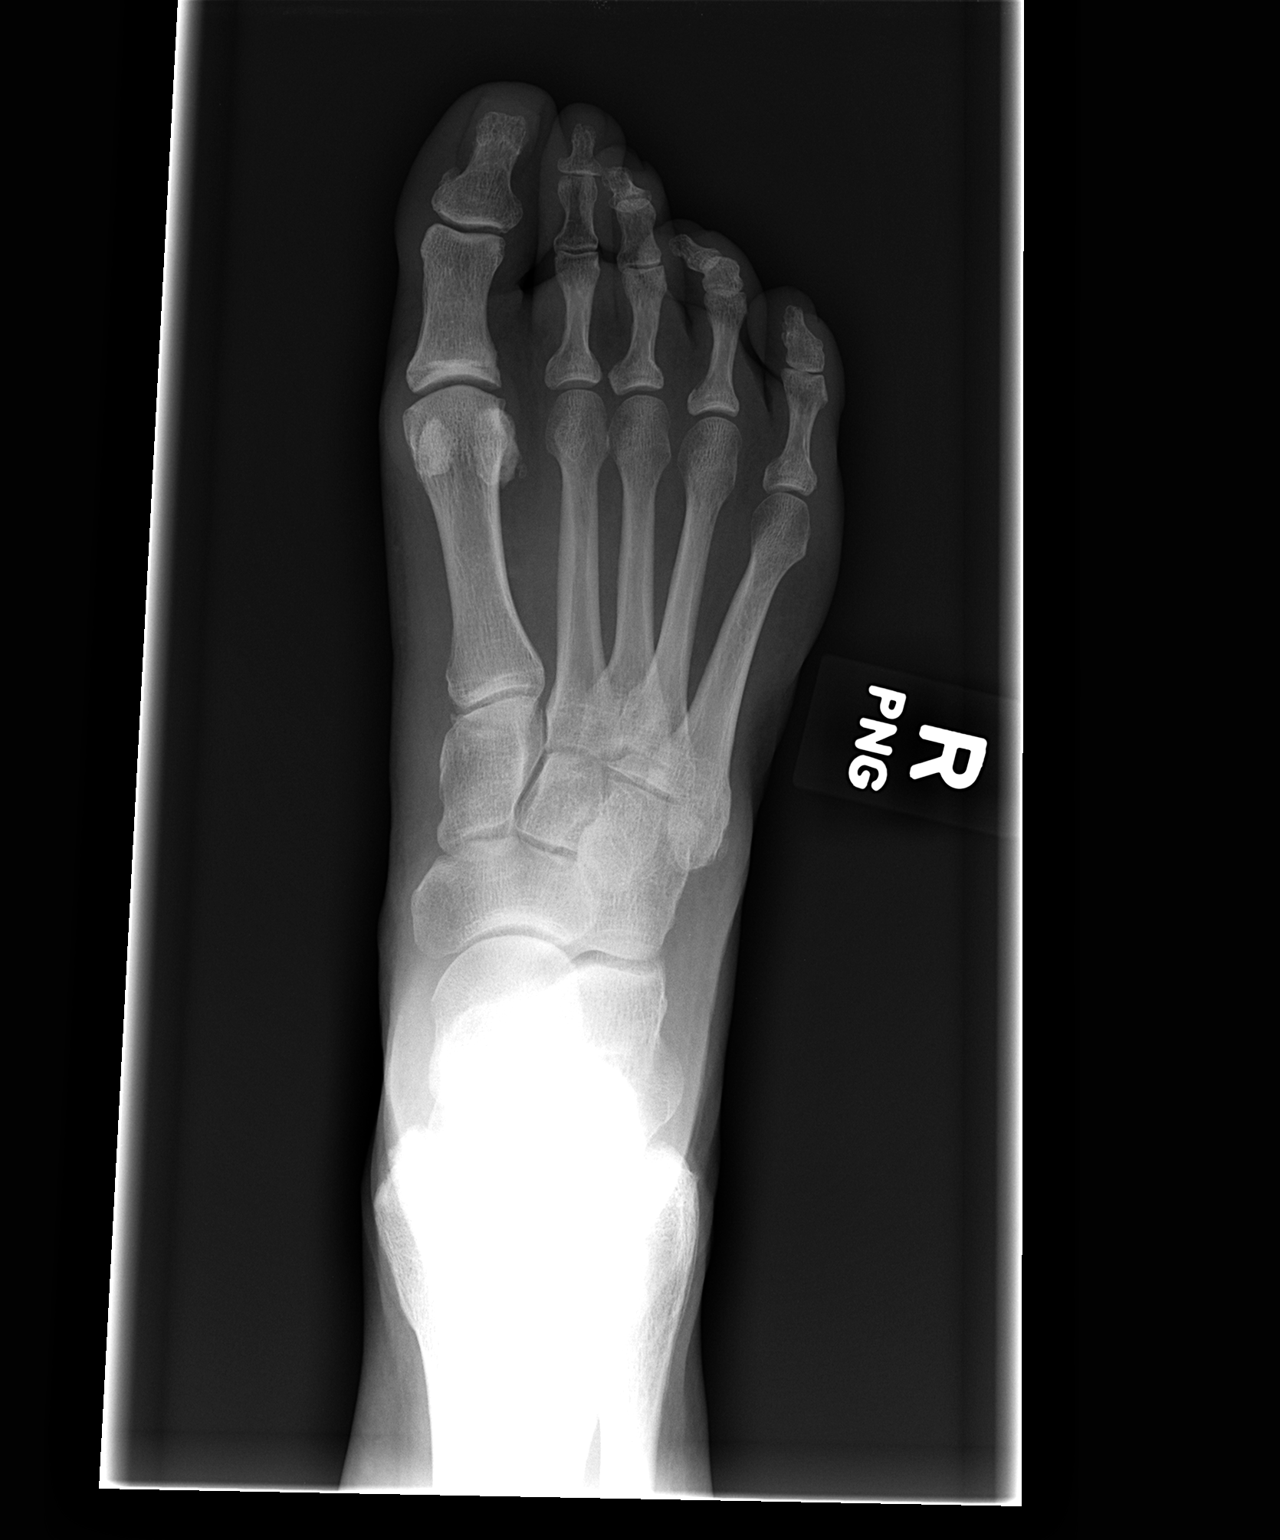

[view not recorded (2 of 3)]
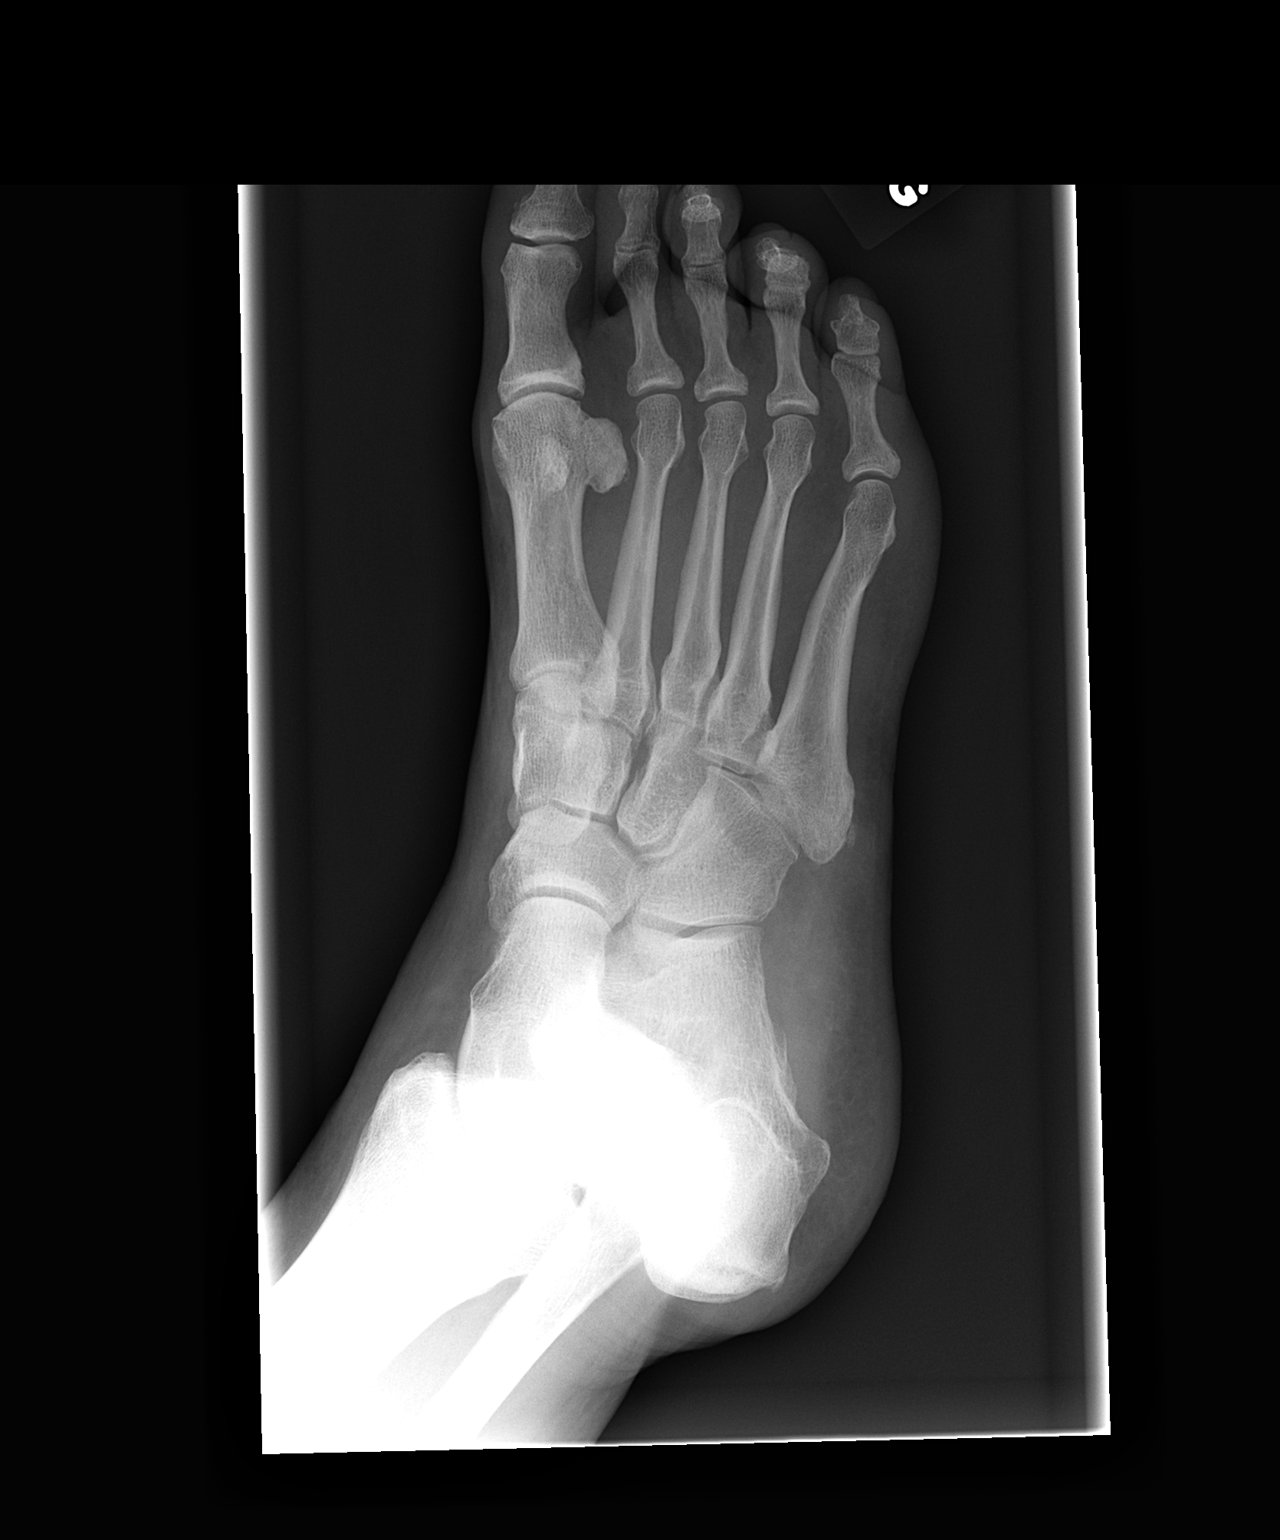

[view not recorded (3 of 3)]
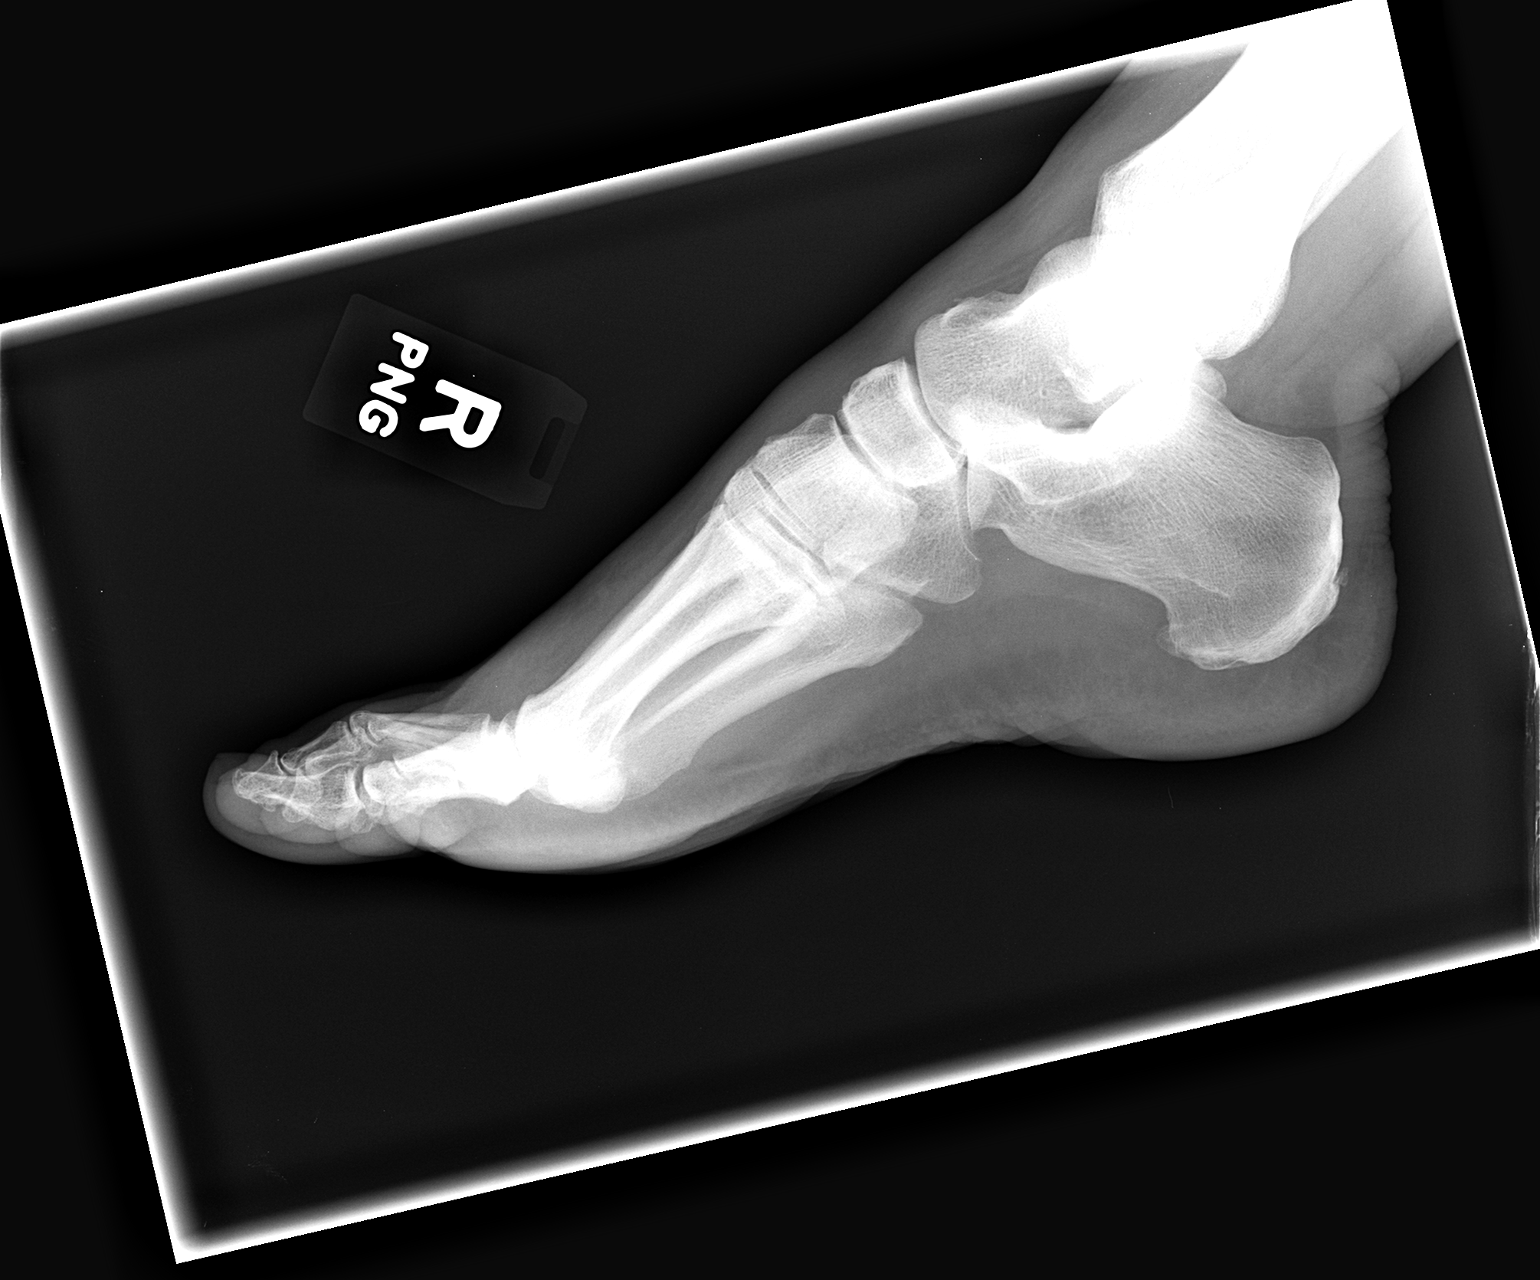

[3 of 3 positions shown; findings below may reference images not displayed]

FINDINGS: Minimal degenerative change over the first MTP joint. No fracture or
dislocation.
IMPRESSION: No acute findings.

## 2017-04-23 ENCOUNTER — Other Ambulatory Visit: Payer: Self-pay | Admitting: Gastroenterology

## 2017-04-24 NOTE — Telephone Encounter (Signed)
Yes you can refill it for her. Zantac 150mg  BID PRN, #180, RF3 Thanks

## 2017-04-24 NOTE — Telephone Encounter (Signed)
Refill request for Zantac 150 mg BID. Last seen 10-2016 by PA Zehr. No current follow up scheduled. Please advise on refills.

## 2017-05-16 DIAGNOSIS — R921 Mammographic calcification found on diagnostic imaging of breast: Secondary | ICD-10-CM | POA: Diagnosis not present

## 2017-05-16 DIAGNOSIS — R922 Inconclusive mammogram: Secondary | ICD-10-CM | POA: Diagnosis not present

## 2017-05-16 DIAGNOSIS — Z853 Personal history of malignant neoplasm of breast: Secondary | ICD-10-CM | POA: Diagnosis not present

## 2017-05-20 ENCOUNTER — Telehealth: Payer: Self-pay | Admitting: Internal Medicine

## 2017-05-20 NOTE — Telephone Encounter (Signed)
Copied from [facility] [id number]. Topic: Quick Communication - See Telephone Encounter >> May 20, 2017 10:29 AM Percell Belt A wrote: CRM for notification. See Telephone encounter for: pt called in and said that she is seeing Dr Thornell Mule about the ringing in her ears.  She would like CMA to call her as she has questions about some test that Dr Thornell Mule is wanting to run and would like to know if Dr Quay Burow agree with these test?   Best number [phone]   05/20/17.

## 2017-05-20 NOTE — Telephone Encounter (Signed)
FYI: Pt wanted to let you know that Dr Thornell Mule wanted to check her TSH and have an [place] of her carotid artery. Advised pt that her TSH was checked in Aug and could let Dr Thornell Mule know that. She is going to proceed with the [place]

## 2017-05-27 ENCOUNTER — Other Ambulatory Visit: Payer: Self-pay | Admitting: Otolaryngology

## 2017-05-27 DIAGNOSIS — H9313 Tinnitus, bilateral: Secondary | ICD-10-CM

## 2017-05-29 ENCOUNTER — Ambulatory Visit
Admission: RE | Admit: 2017-05-29 | Discharge: 2017-05-29 | Disposition: A | Discharge status: Home / Self Care | Payer: Medicare Other | Source: Ambulatory Visit | Attending: Otolaryngology | Admitting: Otolaryngology

## 2017-05-29 ENCOUNTER — Telehealth: Payer: Self-pay

## 2017-05-29 DIAGNOSIS — H9313 Tinnitus, bilateral: Secondary | ICD-10-CM

## 2017-05-29 DIAGNOSIS — I6523 Occlusion and stenosis of bilateral carotid arteries: Secondary | ICD-10-CM | POA: Diagnosis not present

## 2017-05-29 NOTE — Telephone Encounter (Signed)
Received call from pt to ask what she would need to do in order to get her letrozole medication with the same manufacturer. Pt states that she is very sensitive to the fillers that are in the antiestrogen and had some reactions in the past. Pt was getting Letrozole from [facility] for 18 months and this year, her pharmacy at [facility] obtained her Letrozole from [place]. Pt very hesitant to start this medication due to a different manufacturer.   Advised that pt ask other pharmacies if they cary the letrozole manufacturer ([facility]va) and if they are contracted with her insurance. We can transfer her script to her preferred pharmacy anytime. Another way is to ask her current pharmacy if they are able to request for teva for her letrozole instead. We can send out another script that states no substitute for her letrozole.   Pt verbalized understanding and will call back with updates.

## 2017-05-30 ENCOUNTER — Other Ambulatory Visit: Payer: Self-pay

## 2017-05-30 ENCOUNTER — Telehealth: Payer: Self-pay

## 2017-05-30 MED ORDER — LETROZOLE 2.5 MG PO TABS: 2.5000 mg | ORAL_TABLET | Freq: Every day | ORAL | 4 refills | Status: DC

## 2017-05-30 NOTE — Telephone Encounter (Addendum)
Spoke with pt regarding obtaining letrozole Facilities manager) from her pharmacy. Pt states that she called [facility] and they do have [facility]eva on stock. Pt still has unopened bottle that she just picked up the other day (letrozole by [employer]otec).   Pt was advised to have Dr.Gudena's office send in a new prescription that states do not substitute letrozole by [employer]eva for another manufacturer d/t filler sensitivity. Discussed with Dr.Gudena and obtained new script. (faxed to [facility]).  Pt will need to contact her insurance company to make sure that she will not be charge again for the refill medication. Pt states that she will also call [facility] and get her medications straightened out.  No further needs at this time.

## 2017-05-31 ENCOUNTER — Other Ambulatory Visit: Payer: Self-pay

## 2017-05-31 MED ORDER — LETROZOLE 2.5 MG PO TABS: 2.5000 mg | ORAL_TABLET | Freq: Every day | ORAL | 3 refills | Status: DC

## 2017-05-31 MED ORDER — LETROZOLE 2.5 MG PO TABS: 2.5000 mg | ORAL_TABLET | Freq: Every day | ORAL | 4 refills | Status: DC

## 2017-06-03 ENCOUNTER — Other Ambulatory Visit: Payer: Self-pay

## 2017-06-14 ENCOUNTER — Telehealth: Payer: Self-pay

## 2017-06-14 NOTE — Telephone Encounter (Signed)
Spoke with patient concerning upcoming appointment, will mail letter also. Per 2/8 rescheduling.

## 2017-07-03 DIAGNOSIS — H52223 Regular astigmatism, bilateral: Secondary | ICD-10-CM | POA: Diagnosis not present

## 2017-07-03 DIAGNOSIS — H53453 Other localized visual field defect, bilateral: Secondary | ICD-10-CM | POA: Diagnosis not present

## 2017-07-03 DIAGNOSIS — H524 Presbyopia: Secondary | ICD-10-CM | POA: Diagnosis not present

## 2017-07-03 DIAGNOSIS — H2513 Age-related nuclear cataract, bilateral: Secondary | ICD-10-CM | POA: Diagnosis not present

## 2017-07-03 DIAGNOSIS — H40053 Ocular hypertension, bilateral: Secondary | ICD-10-CM | POA: Diagnosis not present

## 2017-07-29 ENCOUNTER — Other Ambulatory Visit: Payer: Self-pay | Admitting: Gastroenterology

## 2017-08-29 ENCOUNTER — Ambulatory Visit: Payer: Medicare Other | Admitting: Hematology and Oncology

## 2017-09-09 ENCOUNTER — Telehealth: Payer: Self-pay | Admitting: Hematology and Oncology

## 2017-09-09 ENCOUNTER — Inpatient Hospital Stay: Payer: Medicare Other | Attending: Hematology and Oncology | Admitting: Hematology and Oncology

## 2017-09-09 DIAGNOSIS — Z170 Estrogen receptor positive status [ER+]: Secondary | ICD-10-CM

## 2017-09-09 DIAGNOSIS — I890 Lymphedema, not elsewhere classified: Secondary | ICD-10-CM | POA: Insufficient documentation

## 2017-09-09 DIAGNOSIS — Z79899 Other long term (current) drug therapy: Secondary | ICD-10-CM | POA: Insufficient documentation

## 2017-09-09 DIAGNOSIS — Z923 Personal history of irradiation: Secondary | ICD-10-CM | POA: Insufficient documentation

## 2017-09-09 DIAGNOSIS — Z882 Allergy status to sulfonamides status: Secondary | ICD-10-CM | POA: Insufficient documentation

## 2017-09-09 DIAGNOSIS — Z7982 Long term (current) use of aspirin: Secondary | ICD-10-CM | POA: Insufficient documentation

## 2017-09-09 DIAGNOSIS — C50212 Malignant neoplasm of upper-inner quadrant of left female breast: Secondary | ICD-10-CM | POA: Diagnosis not present

## 2017-09-09 DIAGNOSIS — N951 Menopausal and female climacteric states: Secondary | ICD-10-CM | POA: Insufficient documentation

## 2017-09-09 DIAGNOSIS — Z888 Allergy status to other drugs, medicaments and biological substances status: Secondary | ICD-10-CM | POA: Diagnosis not present

## 2017-09-09 MED ORDER — LETROZOLE 2.5 MG PO TABS: 2.5000 mg | ORAL_TABLET | Freq: Every day | ORAL | 3 refills | Status: DC

## 2017-09-09 NOTE — Assessment & Plan Note (Signed)
Left lumpectomy 05/31/2015: Invasive ductal carcinoma grade 2, 1.5 cm, 0/3 lymph nodes negative, broadly involves inferior margin, ER 100%, PR 95%, HER-2 negative, Ki-67 5%, T1 cN0 stage IA, Oncotype DX score 18, 12% ROR, Reexcision of margins negative  Adjuvant radiation therapy with Dr. Sondra Come 08/10/15- 09/16/15  Current treatment: Anastrozole 1 mg daily started June1st 2017 stopped 10/17/2015 due to dizziness lightheadedness and fatigue, switched to letrozole starting 11/09/2015  Letrozole toxicities: 1. Intermittent hot flashes 2.Tender spots on the scalp Patient is willing to continue with letrozole. She does not have the dizziness that she had with anastrozole.  Breast Cancer Surveillance: 1. Breast exam  09/09/2017: Mild lymphedema 2. Mammogram 05/16/2017 at [facility]: Benign  Return to clinic in 1 year for follow-up

## 2017-09-09 NOTE — Telephone Encounter (Signed)
Gave avs and calendar ° °

## 2017-09-09 NOTE — Progress Notes (Signed)
Patient Care Team: [name], MD as PCP - General (Internal Medicine) Phineas Real, Belinda Block, MD as Consulting Physician (Gynecology) Armbruster, Carlota Raspberry, MD as Consulting Physician (Gastroenterology) Calvert Cantor, MD as Consulting Physician (Ophthalmology)  DIAGNOSIS:  Encounter Diagnosis  Name Primary?  . Malignant neoplasm of upper-inner quadrant of left breast in female, estrogen receptor positive ([place])     SUMMARY OF ONCOLOGIC HISTORY:   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[place])   05/18/2015 Initial Diagnosis    Left breast biopsy: Invasive ductal carcinoma low to intermediate grade, ER 100%, PR 95%, HER-2 negative ratio 1.02, Ki-67 5%, left breast asymmetry 1.3 cm, T1c N0 stage IA      05/31/2015 Surgery    Left lumpectomy [name]): Invasive ductal carcinoma grade 2, 1.5 cm, 0/3 lymph nodes negative, broadly involves inferior margin, ER 100%, PR 95%, HER-2 negative duration 112, Ki-67 5%, T1 cN0 stage IA, Oncotype DX score 18, 12% ROR      05/31/2015 Oncotype testing    Oncotype DX score: 18/12% ROR      07/01/2015 Surgery    Reexcision of the left inferior margin: Fibrocystic changes with UDH      08/10/2015 - 09/16/2015 Radiation Therapy    Adj XRT ([facility]). 50.4 Gy in 28 fractions to the left breast      10/06/2015 -  Anti-estrogen oral therapy    Anastrozole 1 mg by mouth daily 5 years, switched to letrozole 11/09/2015 due to dizziness lightheadedness and fatigue       CHIEF COMPLIANT: Follow-up on letrozole therapy  INTERVAL HISTORY: Lindsey Barrett is a 72 year old with above-mentioned history of left breast cancer treated with lumpectomy and radiation and is currently on antiestrogen therapy.  She is tolerating letrozole much better.  She does not have dizziness that she had with anastrozole.  She denies any lumps or nodules in the breast.  Mammograms done in January were normal.  REVIEW OF SYSTEMS:   Constitutional: Denies  fevers, chills or abnormal weight loss Eyes: Denies blurriness of vision Ears, nose, mouth, throat, and face: Denies mucositis or sore throat Respiratory: Denies cough, dyspnea or wheezes Cardiovascular: Denies palpitation, chest discomfort Gastrointestinal:  Denies nausea, heartburn or change in bowel habits Skin: Denies abnormal skin rashes Lymphatics: Denies new lymphadenopathy or easy bruising Neurological:Denies numbness, tingling or new weaknesses Behavioral/Psych: Mood is stable, no new changes  Extremities: No lower extremity edema Breast:  denies any pain or lumps or nodules in either breasts All other systems were reviewed with the patient and are negative.  I have reviewed the past medical history, past surgical history, social history and family history with the patient and they are unchanged from previous note.  ALLERGIES:  is allergic to septra ds [sulfamethoxazole-trimethoprim]; arimidex [anastrozole]; sulfa antibiotics; and adhesive [tape].  MEDICATIONS:  Current Outpatient Medications  Medication Sig Dispense Refill  . aspirin 81 MG tablet Take 81 mg by mouth as directed.     . bimatoprost (LUMIGAN) 0.01 % SOLN Place 1 drop into both eyes at bedtime.    . brinzolamide (AZOPT) 1 % ophthalmic suspension 1 drop 2 (two) times daily.    . calcium carbonate (TUMS - DOSED IN MG ELEMENTAL CALCIUM) 500 MG chewable tablet Chew 1 tablet by mouth as needed for indigestion or heartburn.    . Ergocalciferol (VITAMIN D2 PO) Take 5,000 Units by mouth daily.     [name] ibuprofen (ADVIL,MOTRIN) 200 MG tablet Take 200 mg by mouth every 6 (six) hours  as needed.    [name] letrozole (FEMARA) 2.5 MG tablet Take 1 tablet (2.5 mg total) by mouth daily. 90 tablet 3  . ranitidine (ZANTAC) 150 MG tablet Take 1 tablet (150 mg total) by mouth 2 (two) times daily as needed. 180 tablet 2  . rosuvastatin (CRESTOR) 40 MG tablet TAKE 1 TABLET BY MOUTH ONCE DAILY 90 tablet 2   No current facility-administered  medications for this visit.     PHYSICAL EXAMINATION: ECOG PERFORMANCE STATUS: 1 - Symptomatic but completely ambulatory  Vitals:   09/09/17 0843  BP: (!) 118/59  Pulse: 69  Resp: 18  Temp: 99.2 F (37.3 C)  SpO2: 99%   Filed Weights   09/09/17 0843  Weight: 174 lb (78.9 kg)    GENERAL:alert, no distress and comfortable SKIN: skin color, texture, turgor are normal, no rashes or significant lesions EYES: normal, Conjunctiva are pink and non-injected, sclera clear OROPHARYNX:no exudate, no erythema and lips, buccal mucosa, and tongue normal  NECK: supple, thyroid normal size, non-tender, without nodularity LYMPH:  no palpable lymphadenopathy in the cervical, axillary or inguinal LUNGS: clear to auscultation and percussion with normal breathing effort HEART: regular rate & rhythm and no murmurs and no lower extremity edema ABDOMEN:abdomen soft, non-tender and normal bowel sounds MUSCULOSKELETAL:no cyanosis of digits and no clubbing  NEURO: alert & oriented x 3 with fluent speech, no focal motor/sensory deficits EXTREMITIES: No lower extremity edema BREAST: No palpable masses or nodules in either right or left breasts. No palpable axillary supraclavicular or infraclavicular adenopathy no breast tenderness or nipple discharge. (exam performed in the presence of a chaperone)  LABORATORY DATA:  I have reviewed the data as listed CMP Latest Ref Rng & Units 01/04/2017 08/10/2015 05/25/2015  Glucose 70 - 99 mg/dL 99 100(H) 113  BUN 6 - 23 mg/dL 14 12 12.9  Creatinine 0.40 - 1.20 mg/dL 0.74 0.62 0.8  Sodium 135 - 145 mEq/L 141 140 142  Potassium 3.5 - 5.1 mEq/L 4.3 4.2 4.4  Chloride 96 - 112 mEq/L 104 103 -  CO2 19 - 32 mEq/L 29 32 30(H)  Calcium 8.4 - 10.5 mg/dL 9.6 9.4 9.3  Total Protein 6.0 - 8.3 g/dL 6.8 7.3 7.2  Total Bilirubin 0.2 - 1.2 mg/dL 1.4(H) 1.4(H) 1.63(H)  Alkaline Phos 39 - 117 U/L 53 49 59  AST 0 - 37 U/L '24 25 22  ' ALT 0 - 35 U/L '20 22 20    ' Lab Results    Component Value Date   WBC 5.2 01/04/2017   HGB 13.5 01/04/2017   HCT 40.8 01/04/2017   MCV 87.8 01/04/2017   PLT 223.0 01/04/2017   NEUTROABS 2.9 01/04/2017    ASSESSMENT & PLAN:  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[place]) Left lumpectomy 05/31/2015: Invasive ductal carcinoma grade 2, 1.5 cm, 0/3 lymph nodes negative, broadly involves inferior margin, ER 100%, PR 95%, HER-2 negative, Ki-67 5%, T1 cN0 stage IA, Oncotype DX score 18, 12% ROR, Reexcision of margins negative  Adjuvant radiation therapy with Dr. Sondra Come 08/10/15- 09/16/15  Current treatment: Anastrozole 1 mg daily started June1st 2017 stopped 10/17/2015 due to dizziness lightheadedness and fatigue, switched to letrozole starting 11/09/2015  Letrozole toxicities: 1.  Hot flashes  Breast Cancer Surveillance: 1. Breast exam  09/09/2017: Mild lymphedema 2. M[facility]/16/2017 [facility]: Benign  Return to clinic in 1 year for follow-up    No orders of the defined types were placed in this encounter.  The patient has a good  understanding of the overall plan. she agrees with it. she will call with any problems that may develop before the next visit here.   Harriette Ohara, MD 09/09/17

## 2017-09-15 NOTE — Progress Notes (Signed)
Subjective:    Patient ID: Lindsey Barrett, female    DOB: [birth year], 72 y.o.   MRN: [id number]  HPI The patient is here for an acute visit.  Right arm pain and decreased ROM for 8 weeks.  She has pain on the lateral aspect of her right shoulder.  The upper arm/shoulder area aches at night.  She has difficulty adducting while holding something.  She denies any injury.  In march she was picking up and loading /unloading a lot of baggage - the pain may have started 1 week after that.    At night she will get tingling in the right hand.  She denies swelling.      Medications and allergies reviewed with patient and updated if appropriate.  Patient Active Problem List   Diagnosis Date Noted  . Abnormal urine odor 02/26/2017  . Axillary lymphadenopathy 02/26/2017  . Scalp lesion 02/26/2017  . Tinnitus of both ears 01/01/2017  . Change in bowel habits 10/26/2016  . Reaction to internal stress 05/29/2016  . Prediabetes 08/10/2015  . 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[place]) 05/20/2015  . Open-angle glaucoma 06/17/2014  . Vitamin D deficiency 10/18/2011  . HSV infection   . Atrophic vaginitis   . VARICOSE VEINS, LOWER EXTREMITIES 09/28/2009  . HEADACHE 09/28/2009  . GERD 07/14/2009  . NONSPECIFIC ABNORMAL ELECTROCARDIOGRAM 07/14/2009  . Hyperlipidemia 12/26/2006  . GILBERT'S SYNDROME 08/09/2006  . DEGENERATIVE DISC DISEASE 08/09/2006    Current Outpatient Medications on File Prior to Visit  Medication Sig Dispense Refill  . aspirin 81 MG tablet Take 81 mg by mouth as directed.     . bimatoprost (LUMIGAN) 0.01 % SOLN Place 1 drop into both eyes at bedtime.    . brinzolamide (AZOPT) 1 % ophthalmic suspension 1 drop 2 (two) times daily.    . calcium carbonate (TUMS - DOSED IN MG ELEMENTAL CALCIUM) 500 MG chewable tablet Chew 1 tablet by mouth as needed for indigestion or heartburn.    . Ergocalciferol (VITAMIN D2 PO) Take 5,000 Units by mouth daily.       [name] ibuprofen (ADVIL,MOTRIN) 200 MG tablet Take 200 mg by mouth every 6 (six) hours as needed.    [name] letrozole (FEMARA) 2.5 MG tablet Take 1 tablet (2.5 mg total) by mouth daily. 90 tablet 3  . ranitidine (ZANTAC) 150 MG tablet Take 1 tablet (150 mg total) by mouth 2 (two) times daily as needed. 180 tablet 2  . rosuvastatin (CRESTOR) 40 MG tablet TAKE 1 TABLET BY MOUTH ONCE DAILY 90 tablet 2  . [DISCONTINUED] metoprolol tartrate (LOPRESSOR) 25 MG tablet Take 1 tablet (25 mg total) by mouth as directed. 1 pill every 8 hours as needed for tachycardia or palpitations. 30 tablet 2   No current facility-administered medications on file prior to visit.     Past Medical History:  Diagnosis Date  . Anxiety   . Atrophic vaginitis   . Breast cancer ([place])   . 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[place]) 05/20/2015  . Breast cyst    x3  . DDD (degenerative disc disease)   . Diverticulosis   . Fasting hyperglycemia   . GERD (gastroesophageal reflux disease)   . Gilbert syndrome   . Glaucoma   . History of hiatal hernia   . HSV infection   . Hyperlipidemia   . Radiation    left breast 50.4 gray  . STD (sexually transmitted disease)  HSV    Past Surgical History:  Procedure Laterality Date  . APPENDECTOMY  1961  . BREAST CYST ASPIRATION     x2 right ,x1 left  . COLONOSCOPY  2007   neg  . DILATION AND CURETTAGE OF UTERUS  1975  . FLEXIBLE SIGMOIDOSCOPY     x2  . PELVIC LAPAROSCOPY    . RADIOACTIVE SEED GUIDED PARTIAL MASTECTOMY WITH AXILLARY SENTINEL LYMPH NODE BIOPSY Left 05/31/2015   Procedure: LEFT BREAST RADIOACTIVE SEED GUIDED LUMPECTOMY WITH LEFT AXILLARY SENTINEL LYMPH NODE BIOPSY;  Surgeon: [name], MD;  Location: [facility];  Service: General;  Laterality: Left;  . RE-EXCISION OF BREAST LUMPECTOMY Left 07/01/2015   Procedure: RE-EXCISION OF LEFT BREAST INFERIOR MARGIN;  Surgeon: [name], MD;  Location: [facility];  Service: General;  Laterality: Left;  . UPPER GASTROINTESTINAL ENDOSCOPY    . UPPER GI ENDOSCOPY  10/2010   Dr [name]  . WISDOM TOOTH EXTRACTION  1978    Social History   Socioeconomic History  . Marital status: Divorced    Spouse name: Not on file  . Number of children: 0  . Years of education: Not on file  . Highest education level: Not on file  Occupational History  . Occupation: Environmental health practitioner for [employer]  . Financial resource strain: Not on file  . Food insecurity:    Worry: Not on file    Inability: Not on file  . Transportation needs:    Medical: Not on file    Non-medical: Not on file  Tobacco Use  . Smoking status: Former Smoker    Types: Cigarettes    Last attempt to quit: 03/28/1999    Years since quitting: 18.4  . Smokeless tobacco: Never Used  . Tobacco comment: smoked 1967-2000, up to 3/4 ppd  Substance and Sexual Activity  . Alcohol use: Yes    Alcohol/week: 1.2 oz    Types: 2 Cans of beer per week  . Drug use: No  . Sexual activity: Never    Birth control/protection: Post-menopausal    Comment: 1st intercourse 72 yo-More than 5 partners  Lifestyle  . Physical activity:    Days per week: Not on file    Minutes per session: Not on file  . Stress: Not on file  Relationships  . Social connections:    Talks on phone: Not on file    Gets together: Not on file    Attends religious service: Not on file    Active member of club or organization: Not on file    Attends meetings of clubs or organizations: Not on file    Relationship status: Not on file  Other Topics Concern  . 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      Regular exercise-no    Family History  Problem Relation Age of Onset  . Lung cancer Mother        smoker  . Endometriosis Mother   . Cancer Mother        Bladder cancer  . Stomach cancer Maternal Grandmother   . Coronary artery disease Father     . Cancer Father        intestinal carcinoid tumors  . Sudden death Sister        66 week old, congenital deformity  . Liver cancer Maternal Uncle   . Stroke Paternal Grandfather 13  .  Leukemia Maternal Uncle   . Breast cancer Paternal Aunt        great paternal aunt-Age 98's  . Colon cancer Paternal Aunt        lived to 47; pat great aunt  . Vascular Disease Paternal Grandmother        thoracic aneurysm  . Esophageal cancer Neg Hx   . Rectal cancer Neg Hx   . Pancreatic cancer Neg Hx     Review of Systems  Constitutional: Negative for fever.  Musculoskeletal: Positive for arthralgias (right shoulder). Negative for joint swelling.  Skin: Negative for color change.  Neurological: Positive for weakness (only with certain movements of right arm). Negative for numbness (tingling occ in right arm).       Objective:   Vitals:   09/16/17 1333  BP: 110/64  Pulse: 83  Resp: 16  Temp: 98.6 F (37 C)  SpO2: 98%   BP Readings from Last 3 Encounters:  09/16/17 110/64  09/09/17 (!) 118/59  02/26/17 (!) 154/62   Wt Readings from Last 3 Encounters:  09/16/17 175 lb (79.4 kg)  09/09/17 174 lb (78.9 kg)  02/26/17 172 lb (78 kg)   Body mass index is 30.04 kg/m.   Physical Exam    A Right Shoulder exam was performed.   SWELLING: none  EFFUSION: no  WARMTH: no warmth  TENDERNESS: no tenderness on throughout shoulder joint or upper arm ROM: dec ROM without pain with pain with certain movements  NEUROLOGICAL EXAM: normal sensation and strength  PULSES: normal     Assessment & Plan:    See Problem List for Assessment and Plan of chronic medical problems.

## 2017-09-16 ENCOUNTER — Ambulatory Visit (INDEPENDENT_AMBULATORY_CARE_PROVIDER_SITE_OTHER): Payer: Medicare Other | Admitting: Internal Medicine

## 2017-09-16 ENCOUNTER — Encounter: Payer: Self-pay | Admitting: Internal Medicine

## 2017-09-16 VITALS — BP 110/64 | HR 83 | Temp 98.6°F | Resp 16 | Wt 175.0 lb

## 2017-09-16 DIAGNOSIS — M25511 Pain in right shoulder: Secondary | ICD-10-CM | POA: Diagnosis not present

## 2017-09-16 NOTE — Patient Instructions (Addendum)
Use ice on the shoulder.  Take advil twice daily as needed.     No heavy lifting.    Schedule an appointment with sports medicine.      Shoulder Exercises Ask your health care provider which exercises are safe for you. Do exercises exactly as told by your health care provider and adjust them as directed. It is normal to feel mild stretching, pulling, tightness, or discomfort as you do these exercises, but you should stop right away if you feel sudden pain or your pain gets worse.Do not begin these exercises until told by your health care provider. RANGE OF MOTION EXERCISES These exercises warm up your muscles and joints and improve the movement and flexibility of your shoulder. These exercises also help to relieve pain, numbness, and tingling. These exercises involve stretching your injured shoulder directly. Exercise A: Pendulum  1. Stand near a wall or a surface that you can hold onto for balance. 2. Bend at the waist and let your left / right arm hang straight down. Use your other arm to support you. Keep your back straight and do not lock your knees. 3. Relax your left / right arm and shoulder muscles, and move your hips and your trunk so your left / right arm swings freely. Your arm should swing because of the motion of your body, not because you are using your arm or shoulder muscles. 4. Keep moving your body so your arm swings in the following directions, as told by your health care provider: ? Side to side. ? Forward and backward. ? In clockwise and counterclockwise circles. 5. Continue each motion for __________ seconds, or for as long as told by your health care provider. 6. Slowly return to the starting position. Repeat __________ times. Complete this exercise __________ times a day. Exercise B:Flexion, Standing  1. Stand and hold a broomstick, a cane, or a similar object. Place your hands a little more than shoulder-width apart on the object. Your left / right hand should be  palm-up, and your other hand should be palm-down. 2. Keep your elbow straight and keep your shoulder muscles relaxed. Push the stick down with your healthy arm to raise your left / right arm in front of your body, and then over your head until you feel a stretch in your shoulder. ? Avoid shrugging your shoulder while you raise your arm. Keep your shoulder blade tucked down toward the middle of your back. 3. Hold for __________ seconds. 4. Slowly return to the starting position. Repeat __________ times. Complete this exercise __________ times a day. Exercise C: Abduction, Standing 1. Stand and hold a broomstick, a cane, or a similar object. Place your hands a little more than shoulder-width apart on the object. Your left / right hand should be palm-up, and your other hand should be palm-down. 2. While keeping your elbow straight and your shoulder muscles relaxed, push the stick across your body toward your left / right side. Raise your left / right arm to the side of your body and then over your head until you feel a stretch in your shoulder. ? Do not raise your arm above shoulder height, unless your health care provider tells you to do that. ? Avoid shrugging your shoulder while you raise your arm. Keep your shoulder blade tucked down toward the middle of your back. 3. Hold for __________ seconds. 4. Slowly return to the starting position. Repeat __________ times. Complete this exercise __________ times a day. Exercise D:Internal Rotation  1.  Place your left / right hand behind your back, palm-up. 2. Use your other hand to dangle an exercise band, a towel, or a similar object over your shoulder. Grasp the band with your left / right hand so you are holding onto both ends. 3. Gently pull up on the band until you feel a stretch in the front of your left / right shoulder. ? Avoid shrugging your shoulder while you raise your arm. Keep your shoulder blade tucked down toward the middle of your  back. 4. Hold for __________ seconds. 5. Release the stretch by letting go of the band and lowering your hands. Repeat __________ times. Complete this exercise __________ times a day. STRETCHING EXERCISES These exercises warm up your muscles and joints and improve the movement and flexibility of your shoulder. These exercises also help to relieve pain, numbness, and tingling. These exercises are done using your healthy shoulder to help stretch the muscles of your injured shoulder. Exercise E: Warehouse manager (External Rotation and Abduction)  1. Stand in a doorway with one of your feet slightly in front of the other. This is called a staggered stance. If you cannot reach your forearms to the door frame, stand facing a corner of a room. 2. Choose one of the following positions as told by your health care provider: ? Place your hands and forearms on the door frame above your head. ? Place your hands and forearms on the door frame at the height of your head. ? Place your hands on the door frame at the height of your elbows. 3. Slowly move your weight onto your front foot until you feel a stretch across your chest and in the front of your shoulders. Keep your head and chest upright and keep your abdominal muscles tight. 4. Hold for __________ seconds. 5. To release the stretch, shift your weight to your back foot. Repeat __________ times. Complete this stretch __________ times a day. Exercise F:Extension, Standing 1. Stand and hold a broomstick, a cane, or a similar object behind your back. ? Your hands should be a little wider than shoulder-width apart. ? Your palms should face away from your back. 2. Keeping your elbows straight and keeping your shoulder muscles relaxed, move the stick away from your body until you feel a stretch in your shoulder. ? Avoid shrugging your shoulders while you move the stick. Keep your shoulder blade tucked down toward the middle of your back. 3. Hold for __________  seconds. 4. Slowly return to the starting position. Repeat __________ times. Complete this exercise __________ times a day. STRENGTHENING EXERCISES These exercises build strength and endurance in your shoulder. Endurance is the ability to use your muscles for a long time, even after they get tired. Exercise G:External Rotation  1. Sit in a stable chair without armrests. 2. Secure an exercise band at elbow height on your left / right side. 3. Place a soft object, such as a folded towel or a small pillow, between your left / right upper arm and your body to move your elbow a few inches away (about 10 cm) from your side. 4. Hold the end of the band so it is tight and there is no slack. 5. Keeping your elbow pressed against the soft object, move your left / right forearm out, away from your abdomen. Keep your body steady so only your forearm moves. 6. Hold for __________ seconds. 7. Slowly return to the starting position. Repeat __________ times. Complete this exercise __________ times a day.  Exercise H:Shoulder Abduction  1. Sit in a stable chair without armrests, or stand. 2. Hold a __________ weight in your left / right hand, or hold an exercise band with both hands. 3. Start with your arms straight down and your left / right palm facing in, toward your body. 4. Slowly lift your left / right hand out to your side. Do not lift your hand above shoulder height unless your health care provider tells you that this is safe. ? Keep your arms straight. ? Avoid shrugging your shoulder while you do this movement. Keep your shoulder blade tucked down toward the middle of your back. 5. Hold for __________ seconds. 6. Slowly lower your arm, and return to the starting position. Repeat __________ times. Complete this exercise __________ times a day. Exercise I:Shoulder Extension 1. Sit in a stable chair without armrests, or stand. 2. Secure an exercise band to a stable object in front of you where it  is at shoulder height. 3. Hold one end of the exercise band in each hand. Your palms should face each other. 4. Straighten your elbows and lift your hands up to shoulder height. 5. Step back, away from the secured end of the exercise band, until the band is tight and there is no slack. 6. Squeeze your shoulder blades together as you pull your hands down to the sides of your thighs. Stop when your hands are straight down by your sides. Do not let your hands go behind your body. 7. Hold for __________ seconds. 8. Slowly return to the starting position. Repeat __________ times. Complete this exercise __________ times a day. Exercise J:Standing Shoulder Row 1. Sit in a stable chair without armrests, or stand. 2. Secure an exercise band to a stable object in front of you so it is at waist height. 3. Hold one end of the exercise band in each hand. Your palms should be in a thumbs-up position. 4. Bend each of your elbows to an "L" shape (about 90 degrees) and keep your upper arms at your sides. 5. Step back until the band is tight and there is no slack. 6. Slowly pull your elbows back behind you. 7. Hold for __________ seconds. 8. Slowly return to the starting position. Repeat __________ times. Complete this exercise __________ times a day. Exercise K:Shoulder Press-Ups  1. Sit in a stable chair that has armrests. Sit upright, with your feet flat on the floor. 2. Put your hands on the armrests so your elbows are bent and your fingers are pointing forward. Your hands should be about even with the sides of your body. 3. Push down on the armrests and use your arms to lift yourself off of the chair. Straighten your elbows and lift yourself up as much as you comfortably can. ? Move your shoulder blades down, and avoid letting your shoulders move up toward your ears. ? Keep your feet on the ground. As you get stronger, your feet should support less of your body weight as you lift yourself up. 4. Hold  for __________ seconds. 5. Slowly lower yourself back into the chair. Repeat __________ times. Complete this exercise __________ times a day. Exercise L: Wall Push-Ups  1. Stand so you are facing a stable wall. Your feet should be about one arm-length away from the wall. 2. Lean forward and place your palms on the wall at shoulder height. 3. Keep your feet flat on the floor as you bend your elbows and lean forward toward the wall. 4. Hold  for __________ seconds. 5. Straighten your elbows to push yourself back to the starting position. Repeat __________ times. Complete this exercise __________ times a day. This information is not intended to replace advice given to you by your health care provider. Make sure you discuss any questions you have with your health care provider. Document Released: 03/07/2005 Document Revised: 01/16/2016 Document Reviewed: 01/02/2015 Elsevier Interactive Patient Education  2018 [employer].

## 2017-09-16 NOTE — Assessment & Plan Note (Addendum)
Acute pain x 8 weeks - likely from lifting heavy baggage No swelling, occ tingling in arm at night, weakness only with certain movements Mildly dec ROM Possible mild supraspinatus or other rotator cuff muscle tear, possible tendinitis  To see sports med

## 2017-09-17 DIAGNOSIS — H5712 Ocular pain, left eye: Secondary | ICD-10-CM | POA: Diagnosis not present

## 2017-09-17 DIAGNOSIS — H1859 Other hereditary corneal dystrophies: Secondary | ICD-10-CM | POA: Diagnosis not present

## 2017-10-14 NOTE — Progress Notes (Signed)
[name] [address], [place] [address] Phone: [phone] Subjective:    I'm seeing this patient by the request  of:  [name], MD   CC: shoulder pain  URK:[id number]  Lindsey Barrett is a 72 y.o. female coming in with complaint of right shoulder pain. Sleeps on her right side. Pain wakes her up at night. Pain radiates down the arm. Loss of ROM and strength.   Onset- March   Location- Most of her pain is in the upper arm Duration- Deltoid, posterior, bicep and tricep Character- achy, sore Aggravating factors- internal to external rotation, reaching across her body Severity-8 out of 10     Past Medical History:  Diagnosis Date  . Anxiety   . Atrophic vaginitis   . Breast cancer ([facility])   . 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[facility]) 05/20/2015  . Breast cyst    x3  . DDD (degenerative disc disease)   . Diverticulosis   . Fasting hyperglycemia   . GERD (gastroesophageal reflux disease)   . Gilbert syndrome   . Glaucoma   . History of hiatal hernia   . HSV infection   . Hyperlipidemia   . Radiation    left breast 50.4 gray  . STD (sexually transmitted disease)    HSV   Past Surgical History:  Procedure Laterality Date  . APPENDECTOMY  1961  . BREAST CYST ASPIRATION     x2 right ,x1 left  . COLONOSCOPY  2007   neg  . DILATION AND CURETTAGE OF UTERUS  1975  . FLEXIBLE SIGMOIDOSCOPY     x2  . PELVIC LAPAROSCOPY    . RADIOACTIVE SEED GUIDED PARTIAL MASTECTOMY WITH AXILLARY SENTINEL LYMPH NODE BIOPSY Left 05/31/2015   Procedure: LEFT BREAST RADIOACTIVE SEED GUIDED LUMPECTOMY WITH LEFT AXILLARY SENTINEL LYMPH NODE BIOPSY;  Surgeon: [name], MD;  Location: [facility];  Service: General;  Laterality: Left;  . RE-EXCISION OF BREAST LUMPECTOMY Left 07/01/2015   Procedure: RE-EXCISION OF LEFT BREAST INFERIOR MARGIN;  Surgeon: [name], MD;  Location: [facility];  Service: General;  Laterality: Left;  . UPPER GASTROINTESTINAL ENDOSCOPY    . UPPER GI ENDOSCOPY  10/2010   Dr [name]  . WISDOM TOOTH EXTRACTION  1978   Social History   Socioeconomic History  . Marital status: Divorced    Spouse name: Not on file  . Number of children: 0  . Years of education: Not on file  . Highest education level: Not on file  Occupational History  . Occupation: Environmental health practitioner for [employer]  . Financial resource strain: Not on file  . Food insecurity:    Worry: Not on file    Inability: Not on file  . Transportation needs:    Medical: Not on file    Non-medical: Not on file  Tobacco Use  . Smoking status: Former Smoker    Types: Cigarettes    Last attempt to quit: 03/28/1999    Years since quitting: 18.5  . Smokeless tobacco: Never Used  . Tobacco comment: smoked 1967-2000, up to 3/4 ppd  Substance and Sexual Activity  . Alcohol use: Yes    Alcohol/week: 1.2 oz    Types: 2 Cans of beer per week  . Drug use: No  . Sexual activity: Never    Birth control/protection: Post-menopausal    Comment: 1st intercourse 72 yo-More than 5 partners  Lifestyle  .  Physical activity:    Days per week: Not on file    Minutes per session: Not on file  . Stress: Not on file  Relationships  . Social connections:    Talks on phone: Not on file    Gets together: Not on file    Attends religious service: Not on file    Active member of club or organization: Not on file    Attends meetings of clubs or organizations: Not on file    Relationship status: Not on file  Other Topics Concern  . 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      Regular exercise-no   Allergies  Allergen Reactions  . Septra Ds [Sulfamethoxazole-Trimethoprim] Hives  . Arimidex [Anastrozole] Other (See Comments)    Unable to function  . Sulfa Antibiotics   . Adhesive [Tape] Rash   Family History  Problem  Relation Age of Onset  . Lung cancer Mother        smoker  . Endometriosis Mother   . Cancer Mother        Bladder cancer  . Stomach cancer Maternal Grandmother   . Coronary artery disease Father   . Cancer Father        intestinal carcinoid tumors  . Sudden death Sister        5 week old, congenital deformity  . Liver cancer Maternal Uncle   . Stroke Paternal Grandfather 33  . Leukemia Maternal Uncle   . Breast cancer Paternal Aunt        great paternal aunt-Age 67's  . Colon cancer Paternal Aunt        lived to 68; pat great aunt  . Vascular Disease Paternal Grandmother        thoracic aneurysm  . Esophageal cancer Neg Hx   . Rectal cancer Neg Hx   . Pancreatic cancer Neg Hx      Past medical history, social, surgical and family history all reviewed in electronic medical record.  No pertanent information unless stated regarding to the chief complaint.   Review of Systems:Review of systems updated and as accurate as of 10/15/17  No headache, visual changes, nausea, vomiting, diarrhea, constipation, dizziness, abdominal pain, skin rash, fevers, chills, night sweats, weight loss, swollen lymph nodes, body aches, joint swelling, , chest pain, shortness of breath, mood changes.  Positive muscle aches  Objective  Blood pressure 124/60, pulse 76, height 5\' 4"  (1.626 m), weight 174 lb (78.9 kg), SpO2 97 %. Systems examined below as of 10/15/17   General: No apparent distress alert and oriented x3 mood and affect normal, dressed appropriately.  HEENT: Pupils equal, extraocular movements intact  Respiratory: Patient's speak in full sentences and does not appear short of breath  Cardiovascular: No lower extremity edema, non tender, no erythema  Skin: Warm dry intact with no signs of infection or rash on extremities or on axial skeleton.  Abdomen: Soft nontender  Neuro: Cranial nerves II through XII are intact, neurovascularly intact in all extremities with 2+ DTRs and 2+ pulses.    Lymph: No lymphadenopathy of posterior or anterior cervical chain or axillae bilaterally.  Gait normal with good balance and coordination.  MSK:  Non tender with full range of motion and good stability and symmetric strength and tone of  elbows, wrist, hip, knee and ankles bilaterally.  Shoulder: Right Inspection reveals no abnormalities, atrophy or asymmetry. Palpation is normal with  no tenderness over AC joint or bicipital groove. ROM is moderately limited in internal range of motion lacking last 5 degrees of external range of motion Rotator cuff strength normal throughout. signs of impingement with positive Neer and Hawkin's tests, but mild positive empty can sign. Speeds and Yergason's tests normal. Positive O'Brien Normal scapular function observed. Positive painful arc No apprehension sign Contralateral shoulder unremarkable  MSK US performed of: Right This study was ordered, performed, and interpreted by Charlann Boxer D.O.  Shoulder:   Supraspinatus: Degenerative changes noted with tear noted.  Possible anterior tendon cyst noted.  No significant retraction Infraspinatus:  Appears normal on long and transverse views. Significant increase in Doppler flow Subscapularis:  Appears normal on long and transverse views. Positive bursa AC joint: Moderate arthritis Glenohumeral Joint:  Appears normal without effusion.  Mild calcific changes noted Glenoid Labrum:  Intact without visualized tears. Biceps Tendon:  Appears normal on long and transverse views, no fraying of tendon, tendon located in intertubercular groove, no subluxation with shoulder internal or external rotation.  Impression: Degenerative rotator cuff tear   [id number]; 15 additional minutes spent for Therapeutic exercises as stated in above notes.  This included exercises focusing on stretching, strengthening, with significant focus on eccentric aspects.   Long term goals include an improvement in range of motion, strength,  endurance as well as avoiding reinjury. Patient's frequency would include in 1-2 times a day, 3-5 times a week for a duration of 6-12 weeks.Shoulder Exercises that included:  Basic scapular stabilization to include adduction and depression of scapula Scaption, focusing on proper movement and good control Internal and External rotation utilizing a theraband, with elbow tucked at side entire time Rows with theraband which was given    Proper technique shown and discussed handout in great detail with ATC.  All questions were discussed and answered.     Impression and Recommendations:     This case required medical decision making of moderate complexity.      Note: This dictation was prepared with Dragon dictation along with smaller phrase technology. Any transcriptional errors that result from this process are unintentional.

## 2017-10-15 ENCOUNTER — Encounter: Payer: Self-pay | Admitting: Family Medicine

## 2017-10-15 ENCOUNTER — Ambulatory Visit: Payer: Self-pay

## 2017-10-15 ENCOUNTER — Ambulatory Visit (INDEPENDENT_AMBULATORY_CARE_PROVIDER_SITE_OTHER): Payer: Medicare Other | Admitting: Family Medicine

## 2017-10-15 VITALS — BP 124/60 | HR 76 | Ht 64.0 in | Wt 174.0 lb

## 2017-10-15 DIAGNOSIS — G8929 Other chronic pain: Secondary | ICD-10-CM

## 2017-10-15 DIAGNOSIS — M75101 Unspecified rotator cuff tear or rupture of right shoulder, not specified as traumatic: Secondary | ICD-10-CM | POA: Insufficient documentation

## 2017-10-15 DIAGNOSIS — M25511 Pain in right shoulder: Secondary | ICD-10-CM

## 2017-10-15 DIAGNOSIS — M75111 Incomplete rotator cuff tear or rupture of right shoulder, not specified as traumatic: Secondary | ICD-10-CM

## 2017-10-15 NOTE — Patient Instructions (Addendum)
Good to see you  Ice 20 minutes 2 times daily. Usually after activity and before bed. pennsaid pinkie amount topically 2 times daily as needed.  Exercises 3 times a week.  Keep hands within peripheral vision  Stay active  Turmeric 500mg  daily  Tart cherry extract 750 mg at night See me again in 4-6 weeks

## 2017-10-15 NOTE — Assessment & Plan Note (Signed)
Right rotator cuff tear.  Home exercise given, complete tears including potentially healed.  Discussed topical anti-inflammatories, icing regimen, which activities to do which wants to avoid.  Patient is to increase activity as tolerated.  Patient will avoid heavy lifting.  Follow-up again in 4 weeks.  Worsening symptoms consider injection or physical therapy

## 2017-10-30 DIAGNOSIS — H1851 Endothelial corneal dystrophy: Secondary | ICD-10-CM | POA: Diagnosis not present

## 2017-10-30 DIAGNOSIS — H53453 Other localized visual field defect, bilateral: Secondary | ICD-10-CM | POA: Diagnosis not present

## 2017-10-30 DIAGNOSIS — H5712 Ocular pain, left eye: Secondary | ICD-10-CM | POA: Diagnosis not present

## 2017-10-30 DIAGNOSIS — H40053 Ocular hypertension, bilateral: Secondary | ICD-10-CM | POA: Diagnosis not present

## 2017-10-30 DIAGNOSIS — H1859 Other hereditary corneal dystrophies: Secondary | ICD-10-CM | POA: Diagnosis not present

## 2017-11-12 NOTE — Progress Notes (Signed)
[facility] Lindsey Barrett, [place] [address] Phone: [phone] Subjective:    I'm seeing this patient by the request  of:    CC: Shoulder pain follow-up  TTS:[id number]  Lindsey Barrett is a 72 y.o. female coming in with complaint of right shoulder. She feels like she is at 85% out of 100. She still has weakness with external rotation and weighted internal rotation. Also has pain with horizontal abduction.  Overall patient is fairly happy.  Does have some weakness she notices when she is holding her coffee cup     Past Medical History:  Diagnosis Date  . Anxiety   . Atrophic vaginitis   . Breast cancer ([place])   . 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[place]) 05/20/2015  . Breast cyst    x3  . DDD (degenerative disc disease)   . Diverticulosis   . Fasting hyperglycemia   . GERD (gastroesophageal reflux disease)   . Gilbert syndrome   . Glaucoma   . History of hiatal hernia   . HSV infection   . Hyperlipidemia   . Radiation    left breast 50.4 gray  . STD (sexually transmitted disease)    HSV   Past Surgical History:  Procedure Laterality Date  . APPENDECTOMY  1961  . BREAST CYST ASPIRATION     x2 right ,x1 left  . COLONOSCOPY  2007   neg  . DILATION AND CURETTAGE OF UTERUS  1975  . FLEXIBLE SIGMOIDOSCOPY     x2  . PELVIC LAPAROSCOPY    . RADIOACTIVE SEED GUIDED PARTIAL MASTECTOMY WITH AXILLARY SENTINEL LYMPH NODE BIOPSY Left 05/31/2015   Procedure: LEFT BREAST RADIOACTIVE SEED GUIDED LUMPECTOMY WITH LEFT AXILLARY SENTINEL LYMPH NODE BIOPSY;  Surgeon: [name], MD;  Location: [facility];  Service: General;  Laterality: Left;  . RE-EXCISION OF BREAST LUMPECTOMY Left 07/01/2015   Procedure: RE-EXCISION OF LEFT BREAST INFERIOR MARGIN;  Surgeon: [name], MD;  Location: [facility];  Service: General;  Laterality: Left;  . UPPER GASTROINTESTINAL ENDOSCOPY    . UPPER  GI ENDOSCOPY  10/2010   Dr [name]  . WISDOM TOOTH EXTRACTION  1978   Social History   Socioeconomic History  . Marital status: Divorced    Spouse name: Not on file  . Number of children: 0  . Years of education: Not on file  . Highest education level: Not on file  Occupational History  . Occupation: Environmental health practitioner for [employer]  . Financial resource strain: Not on file  . Food insecurity:    Worry: Not on file    Inability: Not on file  . Transportation needs:    Medical: Not on file    Non-medical: Not on file  Tobacco Use  . Smoking status: Former Smoker    Types: Cigarettes    Last attempt to quit: 03/28/1999    Years since quitting: 18.6  . Smokeless tobacco: Never Used  . Tobacco comment: smoked 1967-2000, up to 3/4 ppd  Substance and Sexual Activity  . Alcohol use: Yes    Alcohol/week: 1.2 oz    Types: 2 Cans of beer per week  . Drug use: No  . Sexual activity: Never    Birth control/protection: Post-menopausal    Comment: 1st intercourse 72 yo-More than 5 partners  Lifestyle  . Physical activity:    Days per week: Not on file    Minutes per  session: Not on file  . Stress: Not on file  Relationships  . Social connections:    Talks on phone: Not on file    Gets together: Not on file    Attends religious service: Not on file    Active member of club or organization: Not on file    Attends meetings of clubs or organizations: Not on file    Relationship status: Not on file  Other Topics Concern  . 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      Regular exercise-no   Allergies  Allergen Reactions  . Septra Ds [Sulfamethoxazole-Trimethoprim] Hives  . Arimidex [Anastrozole] Other (See Comments)    Unable to function  . Sulfa Antibiotics   . Adhesive [Tape] Rash   Family History  Problem Relation Age of Onset  . Lung cancer Mother        smoker  . Endometriosis Mother   . Cancer  Mother        Bladder cancer  . Stomach cancer Maternal Grandmother   . Coronary artery disease Father   . Cancer Father        intestinal carcinoid tumors  . Sudden death Sister        77 week old, congenital deformity  . Liver cancer Maternal Uncle   . Stroke Paternal Grandfather 101  . Leukemia Maternal Uncle   . Breast cancer Paternal Aunt        great paternal aunt-Age 39's  . Colon cancer Paternal Aunt        lived to 57; pat great aunt  . Vascular Disease Paternal Grandmother        thoracic aneurysm  . Esophageal cancer Neg Hx   . Rectal cancer Neg Hx   . Pancreatic cancer Neg Hx      Past medical history, social, surgical and family history all reviewed in electronic medical record.  No pertanent information unless stated regarding to the chief complaint.   Review of Systems:Review of systems updated and as accurate as of 11/13/17  No headache, visual changes, nausea, vomiting, diarrhea, constipation, dizziness, abdominal pain, skin rash, fevers, chills, night sweats, weight loss, swollen lymph nodes, body aches, joint swelling, muscle aches, chest pain, shortness of breath, mood changes.   Objective  Blood pressure (!) 102/58, pulse 72, height 5\' 4"  (1.626 m), weight 175 lb (79.4 kg), SpO2 97 %. Systems examined below as of 11/13/17   General: No apparent distress alert and oriented x3 mood and affect normal, dressed appropriately.  HEENT: Pupils equal, extraocular movements intact  Respiratory: Patient's speak in full sentences and does not appear short of breath  Cardiovascular: No lower extremity edema, non tender, no erythema  Skin: Warm dry intact with no signs of infection or rash on extremities or on axial skeleton.  Abdomen: Soft nontender  Neuro: Cranial nerves II through XII are intact, neurovascularly intact in all extremities with 2+ DTRs and 2+ pulses.  Lymph: No lymphadenopathy of posterior or anterior cervical chain or axillae bilaterally.  Gait normal  with good balance and coordination.  MSK:  Non tender with full range of motion and good stability and symmetric strength and tone of , elbows, wrist, hip, knee and ankles bilaterally.  Right shoulder exam shows the patient does have full active range of motion.  Positive impingement still remaining.  4 out of 5 strength of the rotator cuff compared to the  contralateral side.  Neurovascular intact distally Contralateral shoulder show some mild impingement as well  MSK US performed of: Right shoulder This study was ordered, performed, and interpreted by Charlann Boxer D.O.  Shoulder:   Supraspinatus: Patient still has a tear noted but no significant retraction.  There is an intrasubstance tear that is fairly large. Infraspinatus:  Appears normal on long and transverse views. Subscapularis:  Appears normal on long and transverse views. Teres Minor:  Appears normal on long and transverse views. AC joint: Moderate arthritis Glenohumeral Joint:  Appears normal without effusion. Glenoid Labrum:  Intact without visualized tears. Biceps Tendon:  Appears normal on long and transverse views, no fraying of tendon, tendon located in intertubercular groove, no subluxation with shoulder internal or external rotation. No increased power doppler signal. Impression: Healing rotator cuff tear   Impression and Recommendations:     This case required medical decision making of moderate complexity.      Note: This dictation was prepared with Dragon dictation along with smaller phrase technology. Any transcriptional errors that result from this process are unintentional.

## 2017-11-13 ENCOUNTER — Ambulatory Visit (INDEPENDENT_AMBULATORY_CARE_PROVIDER_SITE_OTHER): Payer: Medicare Other | Admitting: Family Medicine

## 2017-11-13 ENCOUNTER — Encounter: Payer: Self-pay | Admitting: Family Medicine

## 2017-11-13 ENCOUNTER — Ambulatory Visit: Payer: Self-pay

## 2017-11-13 VITALS — BP 102/58 | HR 72 | Ht 64.0 in | Wt 175.0 lb

## 2017-11-13 DIAGNOSIS — M25511 Pain in right shoulder: Principal | ICD-10-CM

## 2017-11-13 DIAGNOSIS — G8929 Other chronic pain: Secondary | ICD-10-CM | POA: Diagnosis not present

## 2017-11-13 DIAGNOSIS — M75111 Incomplete rotator cuff tear or rupture of right shoulder, not specified as traumatic: Secondary | ICD-10-CM | POA: Diagnosis not present

## 2017-11-13 NOTE — Assessment & Plan Note (Signed)
Improvement noted.  Patient seems to be making progress.  Sent to formal physical therapy.  Discussed icing regimen, home exercise, which activities of doing which wants to avoid.  Patient will follow-up with me again in 4 to 8 weeks

## 2017-11-13 NOTE — Patient Instructions (Signed)
Good to see you  Lindsey Barrett is your friend.  Stay active.  Keep hands still in peripheral vision  PTwill be calling you and will get you going.  See me again in 4-6 weeks

## 2017-11-29 ENCOUNTER — Ambulatory Visit: Payer: Medicare Other | Attending: Family Medicine | Admitting: Physical Therapy

## 2017-11-29 ENCOUNTER — Other Ambulatory Visit: Payer: Self-pay

## 2017-11-29 DIAGNOSIS — M25511 Pain in right shoulder: Secondary | ICD-10-CM | POA: Diagnosis not present

## 2017-11-29 DIAGNOSIS — M25611 Stiffness of right shoulder, not elsewhere classified: Secondary | ICD-10-CM | POA: Diagnosis not present

## 2017-11-29 NOTE — Therapy (Signed)
[facility] [place], [place], [address] Phone: [phone]   Fax:  [phone]  Physical Therapy Evaluation  Patient Details  Name: Lindsey Barrett MRN: [id number] Date of Birth: 1946-03-01 Referring Provider: Hulan Saas   Encounter Date: 11/29/2017  PT End of Session - 11/29/17 0928    Visit Number  1    Number of Visits  12    Date for PT Re-Evaluation  01/10/18    PT Start Time  0930    PT Stop Time  1008    PT Time Calculation (min)  38 min    Activity Tolerance  Patient tolerated treatment well    Behavior During Therapy  [facility] for tasks assessed/performed       Past Medical History:  Diagnosis Date  . Anxiety   . Atrophic vaginitis   . Breast cancer ([place])   . 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 Breast cyst    x3  . DDD (degenerative disc disease)   . Diverticulosis   . Fasting hyperglycemia   . GERD (gastroesophageal reflux disease)   . Gilbert syndrome   . Glaucoma   . History of hiatal hernia   . HSV infection   . Hyperlipidemia   . Radiation    left breast 50.4 gray  . STD (sexually transmitted disease)    HSV    Past Surgical History:  Procedure Laterality Date  . APPENDECTOMY  1961  . BREAST CYST ASPIRATION     x2 right ,x1 left  . COLONOSCOPY  2007   neg  . DILATION AND CURETTAGE OF UTERUS  1975  . FLEXIBLE SIGMOIDOSCOPY     x2  . PELVIC LAPAROSCOPY    . RADIOACTIVE SEED GUIDED PARTIAL MASTECTOMY WITH AXILLARY SENTINEL LYMPH NODE BIOPSY Left 05/31/2015   Procedure: LEFT BREAST RADIOACTIVE SEED GUIDED LUMPECTOMY WITH LEFT AXILLARY SENTINEL LYMPH NODE BIOPSY;  Surgeon: [name], MD;  Location: [facility];  Service: General;  Laterality: Left;  . RE-EXCISION OF BREAST LUMPECTOMY Left 07/01/2015   Procedure: RE-EXCISION OF LEFT BREAST INFERIOR MARGIN;  Surgeon: [name], MD;  Location: [facility];  Service: General;  Laterality: Left;  . UPPER GASTROINTESTINAL ENDOSCOPY    . UPPER GI ENDOSCOPY  10/2010   Dr [name]  . WISDOM TOOTH EXTRACTION  1978    There were no vitals filed for this visit.   Subjective Assessment - 11/29/17 0942    Subjective  Patient presents with c/o R shoulder pain x 4 months. She has intermittent pain and intermittent ROM deficits. Difficulty with horizontal ADD petting cat and horizontal ABD/ER picking up cup from side table.    Pertinent History  Sulfa Allergy, Breast CA (in remission Left side lumpectomy 2017), R supraspinatus tear    Diagnostic tests  [place] R supraspinatus tear 50% healed    Patient Stated Goals  to decrease pain and increase motion    Currently in Pain?  Yes    Pain Score  5     Pain Location  Shoulder    Pain Orientation  Right    Pain Descriptors / Indicators  Sharp    Pain Type  Chronic pain    Pain Radiating Towards  down the arm to elbow    Pain Onset  More than a month ago    Pain Frequency  Intermittent    Aggravating Factors   unsure  Pain Relieving Factors  IR/ER    Effect of Pain on Daily Activities  limited with ADLS         [facility] PT Assessment - 11/29/17 0001      Assessment   Medical Diagnosis  chronic R shoulder pain    Referring Provider  Hulan Saas    Onset Date/Surgical Date  07/05/17    Hand Dominance  Right      Precautions   Precautions  Other (comment)    Precaution Comments  Sulfa and Tape allergies; NO IONTO      Balance Screen   Has the patient fallen in the past 6 months  No    Has the patient had a decrease in activity level because of a fear of falling?   No    Is the patient reluctant to leave their home because of a fear of falling?   No      Home Film/video editor residence    Living Arrangements  Alone    Home Layout  Two level    Additional Comments  no problems with stairs      Prior Function   Level of Independence  Independent     Vocation  Retired      Mining engineer Comments  mild winging R scapula      ROM / Strength   AROM / PROM / Strength  AROM;PROM;Strength      AROM   Overall AROM Comments  Cervical SB limited 50% bil, L rotation mod tightness    AROM Assessment Site  Shoulder    Right/Left Shoulder  Right    Right Shoulder Extension  -- full    Right Shoulder Flexion  145 Degrees    Right Shoulder ABduction  145 Degrees    Right Shoulder Internal Rotation  66 Degrees    Right Shoulder External Rotation  -- full    Right Shoulder Horizontal  ADduction  33 Degrees pulls      PROM   PROM Assessment Site  Shoulder    Right/Left Shoulder  Right    Right Shoulder Flexion  147 Degrees    Right Shoulder ABduction  148 Degrees    Right Shoulder Internal Rotation  80 Degrees      Strength   Strength Assessment Site  Shoulder    Right/Left Shoulder  Right    Right Shoulder Flexion  5/5    Right Shoulder Extension  5/5    Right Shoulder ABduction  4+/5    Right Shoulder Internal Rotation  5/5    Right Shoulder External Rotation  4/5    Right Shoulder Horizontal ABduction  4+/5 painful    Right Shoulder Horizontal ADduction  5/5         FOTO 45% LIMITED       Objective measurements completed on examination: See above findings.              PT Education - 11/29/17 1004    Education Details  HEP; reviewed tband IR/ER    Person(s) Educated  Patient    Methods  Explanation;Demonstration;Handout    Comprehension  Verbalized understanding;Returned demonstration       PT Short Term Goals - 11/29/17 1100      PT SHORT TERM GOAL #1   Title  I with HEP    Time  2    Period  Weeks    Status  New    Target Date  12/13/17        PT Long Term Goals - 11/29/17 1059      PT LONG TERM GOAL #1   Title  Patient able to don/doff shirt without bending over.    Time  6    Period  Weeks    Status  New    Target Date  01/10/18      PT LONG TERM GOAL #2   Title   Patient able to perform ADLS without limitation in ROM    Time  6    Period  Weeks    Status  New      PT LONG TERM GOAL #3   Title  Patient able to perform ADLs with pain of 2/10 or less in R shoulder.    Time  6    Period  Weeks    Status  New             Plan - 11/29/17 1011    Clinical Impression Statement  Patient presents for low complexity evaluation for R shoulder pain secondary to R supraspinatus tear. She has limited ROM and strength affecting ADLs including donning/doffing her shirts and overhead lifting. She will benefit from PT to address these deficits.    History and Personal Factors relevant to plan of care:  Breast CA,    Clinical Presentation  Stable    Clinical Decision Making  Low    Rehab Potential  Excellent    PT Frequency  2x / week    PT Duration  6 weeks    PT Treatment/Interventions  ADLs/Self Care Home Management;Electrical Stimulation;Cryotherapy;Moist Heat;Ultrasound;Patient/family education;Neuromuscular re-education;Therapeutic exercise;Manual techniques;Dry needling;Passive range of motion    PT Next Visit Plan  Review her HEP from MD, scap/shoulder strength, ROM    PT Home Exercise Plan  wall slides flex/ABD       Patient will benefit from skilled therapeutic intervention in order to improve the following deficits and impairments:  Pain, Decreased strength, Decreased range of motion  Visit Diagnosis: Acute pain of right shoulder - Plan: PT plan of care cert/re-cert  Stiffness of right shoulder, not elsewhere classified - Plan: PT plan of care cert/re-cert     Problem List Patient Active Problem List   Diagnosis Date Noted  . Right rotator cuff tear 10/15/2017  . Acute pain of right shoulder 09/16/2017  . Abnormal urine odor 02/26/2017  . Axillary lymphadenopathy 02/26/2017  . Scalp lesion 02/26/2017  . Tinnitus of both ears 01/01/2017  . Change in bowel habits 10/26/2016  . Reaction to internal stress 05/29/2016  . Prediabetes  08/10/2015  . 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[place]) 05/20/2015  . Open-angle glaucoma 06/17/2014  . Vitamin D deficiency 10/18/2011  . HSV infection   . Atrophic vaginitis   . VARICOSE VEINS, LOWER EXTREMITIES 09/28/2009  . HEADACHE 09/28/2009  . GERD 07/14/2009  . NONSPECIFIC ABNORMAL ELECTROCARDIOGRAM 07/14/2009  . Hyperlipidemia 12/26/2006  . GILBERT'S SYNDROME 08/09/2006  . DEGENERATIVE DISC DISEASE 08/09/2006    Ursula Dermody PT 11/29/2017, 12:02 PM  [facility] [address] [place], [place], [address] Phone: [phone]   Fax:  [phone]  Name: LYNORA DYMOND MRN: [id number] Date of Birth: [birth year]

## 2017-12-10 ENCOUNTER — Encounter: Payer: Self-pay | Admitting: Physical Therapy

## 2017-12-10 ENCOUNTER — Ambulatory Visit: Payer: Medicare Other | Attending: Family Medicine | Admitting: Physical Therapy

## 2017-12-10 DIAGNOSIS — M25511 Pain in right shoulder: Secondary | ICD-10-CM | POA: Insufficient documentation

## 2017-12-10 DIAGNOSIS — M25611 Stiffness of right shoulder, not elsewhere classified: Secondary | ICD-10-CM | POA: Insufficient documentation

## 2017-12-10 NOTE — Therapy (Signed)
[facility] [place], [place], [address] Phone: [phone]   Fax:  [phone]  Physical Therapy Treatment  Patient Details  Name: Lindsey Barrett MRN: [id number] Date of Birth: 03-Jul-1945 Referring Provider: Hulan Saas   Encounter Date: 12/10/2017  PT End of Session - 12/10/17 1043    Visit Number  2    Number of Visits  12    Date for PT Re-Evaluation  01/10/18    PT Start Time  55    PT Stop Time  1058    PT Time Calculation (min)  39 min       Past Medical History:  Diagnosis Date  . Anxiety   . Atrophic vaginitis   . Breast cancer ([place])   . 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[place]) 05/20/2015  . Breast cyst    x3  . DDD (degenerative disc disease)   . Diverticulosis   . Fasting hyperglycemia   . GERD (gastroesophageal reflux disease)   . Gilbert syndrome   . Glaucoma   . History of hiatal hernia   . HSV infection   . Hyperlipidemia   . Radiation    left breast 50.4 gray  . STD (sexually transmitted disease)    HSV    Past Surgical History:  Procedure Laterality Date  . APPENDECTOMY  1961  . BREAST CYST ASPIRATION     x2 right ,x1 left  . COLONOSCOPY  2007   neg  . DILATION AND CURETTAGE OF UTERUS  1975  . FLEXIBLE SIGMOIDOSCOPY     x2  . PELVIC LAPAROSCOPY    . RADIOACTIVE SEED GUIDED PARTIAL MASTECTOMY WITH AXILLARY SENTINEL LYMPH NODE BIOPSY Left 05/31/2015   Procedure: LEFT BREAST RADIOACTIVE SEED GUIDED LUMPECTOMY WITH LEFT AXILLARY SENTINEL LYMPH NODE BIOPSY;  Surgeon: [name], MD;  Location: [facility];  Service: General;  Laterality: Left;  . RE-EXCISION OF BREAST LUMPECTOMY Left 07/01/2015   Procedure: RE-EXCISION OF LEFT BREAST INFERIOR MARGIN;  Surgeon: [name], MD;  Location: [facility];  Service: General;  Laterality: Left;  . UPPER GASTROINTESTINAL ENDOSCOPY    . UPPER GI ENDOSCOPY   10/2010   Dr [name]  . WISDOM TOOTH EXTRACTION  1978    There were no vitals filed for this visit.  Subjective Assessment - 12/10/17 1022    Subjective  better, still waking me up in the morning    Currently in Pain?  Yes    Pain Score  2     Pain Location  Arm    Pain Orientation  Right         [facility] PT Assessment - 12/10/17 0001      AROM   AROM Assessment Site  Shoulder    Right/Left Shoulder  Right    Right Shoulder Flexion  165 Degrees    Right Shoulder ABduction  150 Degrees    Right Shoulder Internal Rotation  66 Degrees    Right Shoulder External Rotation  90 Degrees                   [facility] Adult PT Treatment/Exercise - 12/10/17 0001      Exercises   Exercises  Shoulder;Neck      Neck Exercises: Machines for Strengthening   UBE (Upper Arm Bike)  2 fwd/2 back L 3    Cybex Row  20# 2 SETS 15    Lat Pull  20#  2 sets 10  educ on hands close and in front of head      Shoulder Exercises: Seated   Other Seated Exercises  2# row,ext and horz abd 2 sets 10      Shoulder Exercises: Standing   Other Standing Exercises  ball vs wall 5 times CC and CCW             PT Education - 12/10/17 1032    Education Details  reviewed gym ex ( decrease 5# to 2#). reviewed door stretch, added tband scap stab plus ER - yelllow tband    Person(s) Educated  Patient    Methods  Explanation;Demonstration;Verbal cues;Handout    Comprehension  Verbalized understanding;Returned demonstration;Verbal cues required       PT Short Term Goals - 12/10/17 1047      PT SHORT TERM GOAL #1   Title  I with HEP    Status  Achieved        PT Long Term Goals - 11/29/17 1059      PT LONG TERM GOAL #1   Title  Patient able to don/doff shirt without bending over.    Time  6    Period  Weeks    Status  New    Target Date  01/10/18      PT LONG TERM GOAL #2   Title  Patient able to perform ADLS without limitation in ROM    Time  6    Period  Weeks    Status  New       PT LONG TERM GOAL #3   Title  Patient able to perform ADLs with pain of 2/10 or less in R shoulder.    Time  6    Period  Weeks    Status  New            Plan - 12/10/17 1045    Clinical Impression Statement  post. shld strengthening and RTC- tolerated intiall ther ex well but needed cuing for posture and compensation esp from traps. educ on HEP and saftey in gym ( correct wt and tech).     PT Treatment/Interventions  ADLs/Self Care Home Management;Electrical Stimulation;Cryotherapy;Moist Heat;Ultrasound;Patient/family education;Neuromuscular re-education;Therapeutic exercise;Manual techniques;Dry needling;Passive range of motion    PT Next Visit Plan  Review her HEP from MD, scap/shoulder strength, ROM       Patient will benefit from skilled therapeutic intervention in order to improve the following deficits and impairments:  Pain, Decreased strength, Decreased range of motion  Visit Diagnosis: Acute pain of right shoulder  Stiffness of right shoulder, not elsewhere classified     Problem List Patient Active Problem List   Diagnosis Date Noted  . Right rotator cuff tear 10/15/2017  . Acute pain of right shoulder 09/16/2017  . Abnormal urine odor 02/26/2017  . Axillary lymphadenopathy 02/26/2017  . Scalp lesion 02/26/2017  . Tinnitus of both ears 01/01/2017  . Change in bowel habits 10/26/2016  . Reaction to internal stress 05/29/2016  . Prediabetes 08/10/2015  . 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[place]) 05/20/2015  . Open-angle glaucoma 06/17/2014  . Vitamin D deficiency 10/18/2011  . HSV infection   . Atrophic vaginitis   . VARICOSE VEINS, LOWER EXTREMITIES 09/28/2009  . HEADACHE 09/28/2009  . GERD 07/14/2009  . NONSPECIFIC ABNORMAL ELECTROCARDIOGRAM 07/14/2009  . Hyperlipidemia 12/26/2006  . GILBERT'S SYNDROME 08/09/2006  . DEGENERATIVE DISC DISEASE 08/09/2006    Ivan Maskell,ANGIE  PTA 12/10/2017, 10:50 AM  [facility]  [address] [place], [place], [address] Phone: [phone]   Fax:  [phone]  Name: Lindsey Barrett MRN: [id number] Date of Birth: Sep 29, 1945

## 2017-12-12 ENCOUNTER — Encounter: Payer: Self-pay | Admitting: Gynecology

## 2017-12-12 ENCOUNTER — Ambulatory Visit (INDEPENDENT_AMBULATORY_CARE_PROVIDER_SITE_OTHER): Payer: Medicare Other | Admitting: Gynecology

## 2017-12-12 VITALS — BP 120/70 | Ht 65.0 in | Wt 173.0 lb

## 2017-12-12 DIAGNOSIS — Z124 Encounter for screening for malignant neoplasm of cervix: Secondary | ICD-10-CM | POA: Diagnosis not present

## 2017-12-12 DIAGNOSIS — Z853 Personal history of malignant neoplasm of breast: Secondary | ICD-10-CM

## 2017-12-12 DIAGNOSIS — Z01419 Encounter for gynecological examination (general) (routine) without abnormal findings: Secondary | ICD-10-CM

## 2017-12-12 DIAGNOSIS — Z9189 Other specified personal risk factors, not elsewhere classified: Secondary | ICD-10-CM

## 2017-12-12 DIAGNOSIS — N952 Postmenopausal atrophic vaginitis: Secondary | ICD-10-CM

## 2017-12-12 NOTE — Patient Instructions (Signed)
Office will call to arrange for bone density.  Follow-up for gynecologic exam in 1 year.

## 2017-12-12 NOTE — Progress Notes (Signed)
    Lindsey Barrett 06/27/1945 [id number]        72 y.o.  G0P0 for breast and pelvic exam.  Without gynecologic complaints  Past medical history,surgical history, problem list, medications, allergies, family history and social history were all reviewed and documented as reviewed in the EPIC chart.  ROS:  Performed with pertinent positives and negatives included in the history, assessment and plan.   Additional significant findings : None   Exam: Caryn Bee assistant Vitals:   12/12/17 1101  BP: 120/70  Weight: 173 lb (78.5 kg)  Height: 5\' 5"  (1.651 m)   Body mass index is 28.79 kg/m.  General appearance:  Normal affect, orientation and appearance. Skin: Grossly normal HEENT: Without gross lesions.  No cervical or supraclavicular adenopathy. Thyroid normal.  Lungs:  Clear without wheezing, rales or rhonchi Cardiac: RR, without RMG Abdominal:  Soft, nontender, without masses, guarding, rebound, organomegaly or hernia Breasts:  Examined lying and sitting without masses, retractions, discharge or axillary adenopathy.  Left breast with well-healed lumpectomy scar. Pelvic:  Ext, BUS, Vagina: With atrophic changes  Cervix: With atrophic changes.  Pap smear done  Uterus: Anteverted, normal size, shape and contour, midline and mobile nontender   Adnexa: Without masses or tenderness    Anus and perineum: Normal   Rectovaginal: Normal sphincter tone without palpated masses or tenderness.    Assessment/Plan:  72 y.o. G0P0 female for breast and pelvic exam  1. Postmenopausal.  No significant menopausal symptoms or any vaginal bleeding. 2. History of left breast cancer.  Exam NED.  Mammography 05/2017.  Continues on aromatase inhibitor.  Continues to follow-up with her oncologist. 3. Pap smear 2016.  Pap smear done today.  No history of abnormal Pap smears previously.  Options to stop screening per current screening guidelines and age reviewed.  Will readdress on an annual  basis. 4. Colonoscopy 2017.  Repeat at their recommended interval. 5. DEXA 2016 normal.  Recommend follow-up DEXA now at 3-year interval as she is on an aromatase inhibitor.  Will have office staff call and schedule for her. 6. Health maintenance.  No routine lab work done as patient does this elsewhere.  Follow-up 1 year, sooner as needed.   [name] MD, 12:06 PM 12/12/2017

## 2017-12-13 ENCOUNTER — Other Ambulatory Visit: Payer: Self-pay | Admitting: *Deleted

## 2017-12-13 ENCOUNTER — Ambulatory Visit: Payer: Medicare Other | Admitting: Physical Therapy

## 2017-12-13 ENCOUNTER — Encounter: Payer: Self-pay | Admitting: Physical Therapy

## 2017-12-13 DIAGNOSIS — M25611 Stiffness of right shoulder, not elsewhere classified: Secondary | ICD-10-CM | POA: Diagnosis not present

## 2017-12-13 DIAGNOSIS — M25511 Pain in right shoulder: Secondary | ICD-10-CM

## 2017-12-13 DIAGNOSIS — Z853 Personal history of malignant neoplasm of breast: Secondary | ICD-10-CM

## 2017-12-13 LAB — PAP IG W/ RFLX HPV ASCU

## 2017-12-13 NOTE — Therapy (Signed)
Lindsey Barrett, [place], [address] Phone: [phone]   Fax:  [phone]  Physical Therapy Treatment  Patient Details  Name: Lindsey Barrett MRN: [id number] Date of Birth: 1945-08-23 Referring Provider: Hulan Saas   Encounter Date: 12/13/2017  PT End of Session - 12/13/17 1046    Visit Number  3    Number of Visits  12    Date for PT Re-Evaluation  01/10/18    PT Start Time  5427    PT Stop Time  1047    PT Time Calculation (min)  32 min       Past Medical History:  Diagnosis Date  . Anxiety   . Atrophic vaginitis   . Breast cancer ([facility])   . 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[facility]) 05/20/2015  . Breast cyst    x3  . DDD (degenerative disc disease)   . Diverticulosis   . Fasting hyperglycemia   . GERD (gastroesophageal reflux disease)   . Gilbert syndrome   . Glaucoma   . History of hiatal hernia   . HSV infection   . Hyperlipidemia   . Radiation    left breast 50.4 gray  . STD (sexually transmitted disease)    HSV    Past Surgical History:  Procedure Laterality Date  . APPENDECTOMY  1961  . BREAST CYST ASPIRATION     x2 right ,x1 left  . COLONOSCOPY  2007   neg  . DILATION AND CURETTAGE OF UTERUS  1975  . FLEXIBLE SIGMOIDOSCOPY     x2  . PELVIC LAPAROSCOPY    . RADIOACTIVE SEED GUIDED PARTIAL MASTECTOMY WITH AXILLARY SENTINEL LYMPH NODE BIOPSY Left 05/31/2015   Procedure: LEFT BREAST RADIOACTIVE SEED GUIDED LUMPECTOMY WITH LEFT AXILLARY SENTINEL LYMPH NODE BIOPSY;  Surgeon: [name], MD;  Location: [facility];  Service: General;  Laterality: Left;  . RE-EXCISION OF BREAST LUMPECTOMY Left 07/01/2015   Procedure: RE-EXCISION OF LEFT BREAST INFERIOR MARGIN;  Surgeon: [name], MD;  Location: [facility];  Service: General;  Laterality: Left;  . UPPER GASTROINTESTINAL ENDOSCOPY    . UPPER GI ENDOSCOPY   10/2010   Dr [name]  . WISDOM TOOTH EXTRACTION  1978    There were no vitals filed for this visit.  Subjective Assessment - 12/13/17 1016    Subjective  HEPS are good , but I have questions re: ones form MD. overall shld better    Currently in Pain?  No/denies                       [facility] Adult PT Treatment/Exercise - 12/13/17 0001      Exercises   Exercises  Shoulder;Neck      Neck Exercises: Machines for Strengthening   UBE (Upper Arm Bike)  3 fwd/3 back L 3      Shoulder Exercises: Seated   Other Seated Exercises  4# row,ext and horz abd 2 sets 10      Shoulder Exercises: Standing   Other Standing Exercises  ball vs wall 5 times CC and CCW    Other Standing Exercises  3# PNF 2 sets 10             PT Education - 12/13/17 1022    Education Details  reviewed HEP with tband issued from MD    Person(s) Educated  Patient    Methods  Explanation;Demonstration;Verbal cues  Comprehension  Verbalized understanding;Returned demonstration       PT Short Term Goals - 12/10/17 1047      PT SHORT TERM GOAL #1   Title  I with HEP    Status  Achieved        PT Long Term Goals - 11/29/17 1059      PT LONG TERM GOAL #1   Title  Patient able to don/doff shirt without bending over.    Time  6    Period  Weeks    Status  New    Target Date  01/10/18      PT LONG TERM GOAL #2   Title  Patient able to perform ADLS without limitation in ROM    Time  6    Period  Weeks    Status  New      PT LONG TERM GOAL #3   Title  Patient able to perform ADLs with pain of 2/10 or less in R shoulder.    Time  6    Period  Weeks    Status  New            Plan - 12/13/17 1046    Clinical Impression Statement  pt making good progress. reviewed MD HEP to assure tech is correct- postural cuing given.    PT Treatment/Interventions  ADLs/Self Care Home Management;Electrical Stimulation;Cryotherapy;Moist Heat;Ultrasound;Patient/family education;Neuromuscular  re-education;Therapeutic exercise;Manual techniques;Dry needling;Passive range of motion    PT Next Visit Plan  assess HEP and progress       Patient will benefit from skilled therapeutic intervention in order to improve the following deficits and impairments:  Pain, Decreased strength, Decreased range of motion  Visit Diagnosis: Acute pain of right shoulder  Stiffness of right shoulder, not elsewhere classified     Problem List Patient Active Problem List   Diagnosis Date Noted  . Right rotator cuff tear 10/15/2017  . Acute pain of right shoulder 09/16/2017  . Abnormal urine odor 02/26/2017  . Axillary lymphadenopathy 02/26/2017  . Scalp lesion 02/26/2017  . Tinnitus of both ears 01/01/2017  . Change in bowel habits 10/26/2016  . Reaction to internal stress 05/29/2016  . Prediabetes 08/10/2015  . 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[facility]) 05/20/2015  . Open-angle glaucoma 06/17/2014  . Vitamin D deficiency 10/18/2011  . HSV infection   . Atrophic vaginitis   . VARICOSE VEINS, LOWER EXTREMITIES 09/28/2009  . HEADACHE 09/28/2009  . GERD 07/14/2009  . NONSPECIFIC ABNORMAL ELECTROCARDIOGRAM 07/14/2009  . Hyperlipidemia 12/26/2006  . GILBERT'S SYNDROME 08/09/2006  . DEGENERATIVE DISC DISEASE 08/09/2006    PAYSEUR,ANGIE  PTA 12/13/2017, 10:48 AM  [address] [place], [place], [address] Phone: [phone]   Fax:  [phone]  Name: Lindsey Barrett MRN: [id number] Date of Birth: 1945/08/15

## 2017-12-17 ENCOUNTER — Encounter: Payer: Self-pay | Admitting: Physical Therapy

## 2017-12-17 ENCOUNTER — Ambulatory Visit: Payer: Medicare Other | Admitting: Physical Therapy

## 2017-12-17 DIAGNOSIS — M25611 Stiffness of right shoulder, not elsewhere classified: Secondary | ICD-10-CM | POA: Diagnosis not present

## 2017-12-17 DIAGNOSIS — M25511 Pain in right shoulder: Secondary | ICD-10-CM

## 2017-12-17 NOTE — Therapy (Signed)
[address] [place], [place], [address] Phone: [phone]   Fax:  [phone]  Physical Therapy Treatment  Patient Details  Name: Lindsey Barrett MRN: [id number] Date of Birth: Aug 07, 1945 Referring Provider: Hulan Saas   Encounter Date: 12/17/2017  PT End of Session - 12/17/17 1044    Visit Number  4    Number of Visits  12    Date for PT Re-Evaluation  01/10/18    PT Start Time  0998    PT Stop Time  1105    PT Time Calculation (min)  50 min       Past Medical History:  Diagnosis Date  . Anxiety   . Atrophic vaginitis   . Breast cancer ([facility])   . 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 Breast cyst    x3  . DDD (degenerative disc disease)   . Diverticulosis   . Fasting hyperglycemia   . GERD (gastroesophageal reflux disease)   . Gilbert syndrome   . Glaucoma   . History of hiatal hernia   . HSV infection   . Hyperlipidemia   . Radiation    left breast 50.4 gray  . STD (sexually transmitted disease)    HSV    Past Surgical History:  Procedure Laterality Date  . APPENDECTOMY  1961  . BREAST CYST ASPIRATION     x2 right ,x1 left  . COLONOSCOPY  2007   neg  . DILATION AND CURETTAGE OF UTERUS  1975  . FLEXIBLE SIGMOIDOSCOPY     x2  . PELVIC LAPAROSCOPY    . RADIOACTIVE SEED GUIDED PARTIAL MASTECTOMY WITH AXILLARY SENTINEL LYMPH NODE BIOPSY Left 05/31/2015   Procedure: LEFT BREAST RADIOACTIVE SEED GUIDED LUMPECTOMY WITH LEFT AXILLARY SENTINEL LYMPH NODE BIOPSY;  Surgeon: [name], MD;  Location: [facility];  Service: General;  Laterality: Left;  . RE-EXCISION OF BREAST LUMPECTOMY Left 07/01/2015   Procedure: RE-EXCISION OF LEFT BREAST INFERIOR MARGIN;  Surgeon: [name], MD;  Location: [facility];  Service: General;  Laterality: Left;  . UPPER GASTROINTESTINAL ENDOSCOPY    . UPPER GI ENDOSCOPY   10/2010   Dr [name]  . WISDOM TOOTH EXTRACTION  1978    There were no vitals filed for this visit.  Subjective Assessment - 12/17/17 1020    Subjective  I was doing great until I made a quick mvmt from a bug and it hurt    Currently in Pain?  Yes    Pain Score  3     Pain Location  Arm    Pain Orientation  Right                       [facility] Adult PT Treatment/Exercise - 12/17/17 0001      Exercises   Exercises  Shoulder;Neck      Neck Exercises: Machines for Strengthening   UBE (Upper Arm Bike)  3 fwd/3 back L 3    Cybex Row  25# 2 SETS 15    Lat Pull  20# 2 sets 15     Other Machines for Strengthening  cable pulleys scap stab and IR/ER      Shoulder Exercises: Seated   External Rotation  Strengthening;Right;10 reps   2 sets 2# , 1 set 3#     Shoulder Exercises: Standing   Other Standing Exercises  2# empty cane 2 sets  10    Other Standing Exercises  3# PNF 2 sets 10      Modalities   Modalities  Cryotherapy;Electrical Stimulation      Cryotherapy   Number Minutes Cryotherapy  15 Minutes    Cryotherapy Location  Shoulder    Type of Cryotherapy  Ice pack      Electrical Stimulation   Electrical Stimulation Location  RT shld    Electrical Stimulation Action  IFC    Electrical Stimulation Goals  Pain               PT Short Term Goals - 12/10/17 1047      PT SHORT TERM GOAL #1   Title  I with HEP    Status  Achieved        PT Long Term Goals - 11/29/17 1059      PT LONG TERM GOAL #1   Title  Patient able to don/doff shirt without bending over.    Time  6    Period  Weeks    Status  New    Target Date  01/10/18      PT LONG TERM GOAL #2   Title  Patient able to perform ADLS without limitation in ROM    Time  6    Period  Weeks    Status  New      PT LONG TERM GOAL #3   Title  Patient able to perform ADLs with pain of 2/10 or less in R shoulder.    Time  6    Period  Weeks    Status  New            Plan - 12/17/17  1044    Clinical Impression Statement  pt with some increased pain today after jerking from spider, noted weakness in IR/ER- verb and tactile cuing needed. trial of estim for pain on RT shld    PT Treatment/Interventions  ADLs/Self Care Home Management;Electrical Stimulation;Cryotherapy;Moist Heat;Ultrasound;Patient/family education;Neuromuscular re-education;Therapeutic exercise;Manual techniques;Dry needling;Passive range of motion    PT Next Visit Plan  assess and progress       Patient will benefit from skilled therapeutic intervention in order to improve the following deficits and impairments:  Pain, Decreased strength, Decreased range of motion  Visit Diagnosis: Acute pain of right shoulder  Stiffness of right shoulder, not elsewhere classified     Problem List Patient Active Problem List   Diagnosis Date Noted  . Right rotator cuff tear 10/15/2017  . Acute pain of right shoulder 09/16/2017  . Abnormal urine odor 02/26/2017  . Axillary lymphadenopathy 02/26/2017  . Scalp lesion 02/26/2017  . Tinnitus of both ears 01/01/2017  . Change in bowel habits 10/26/2016  . Reaction to internal stress 05/29/2016  . Prediabetes 08/10/2015  . 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[facility]) 05/20/2015  . Open-angle glaucoma 06/17/2014  . Vitamin D deficiency 10/18/2011  . HSV infection   . Atrophic vaginitis   . VARICOSE VEINS, LOWER EXTREMITIES 09/28/2009  . HEADACHE 09/28/2009  . GERD 07/14/2009  . NONSPECIFIC ABNORMAL ELECTROCARDIOGRAM 07/14/2009  . Hyperlipidemia 12/26/2006  . GILBERT'S SYNDROME 08/09/2006  . DEGENERATIVE DISC DISEASE 08/09/2006    PAYSEUR,ANGIE PTA 12/17/2017, 10:46 AM  [address] [place], [place], [address] Phone: [phone]   Fax:  [phone]  Name: LARNA CAPELLE MRN: [id number] Date of Birth: 1945/06/23

## 2017-12-19 ENCOUNTER — Ambulatory Visit: Payer: Medicare Other | Admitting: Physical Therapy

## 2017-12-19 ENCOUNTER — Encounter: Payer: Self-pay | Admitting: Physical Therapy

## 2017-12-19 DIAGNOSIS — M25511 Pain in right shoulder: Secondary | ICD-10-CM

## 2017-12-19 DIAGNOSIS — M25611 Stiffness of right shoulder, not elsewhere classified: Secondary | ICD-10-CM

## 2017-12-19 NOTE — Therapy (Signed)
[address] [place], [place], [address] Phone: [phone]   Fax:  [phone]  Physical Therapy Treatment  Patient Details  Name: Lindsey Barrett MRN: [id number] Date of Birth: 10/18/1945 Referring Provider: Hulan Saas   Encounter Date: 12/19/2017  PT End of Session - 12/19/17 1049    Visit Number  5    Number of Visits  12    Date for PT Re-Evaluation  01/10/18    PT Start Time  2426    PT Stop Time  1112    PT Time Calculation (min)  57 min       Past Medical History:  Diagnosis Date  . Anxiety   . Atrophic vaginitis   . Breast cancer ([facility])   . 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[facility]) 05/20/2015  . Breast cyst    x3  . DDD (degenerative disc disease)   . Diverticulosis   . Fasting hyperglycemia   . GERD (gastroesophageal reflux disease)   . Gilbert syndrome   . Glaucoma   . History of hiatal hernia   . HSV infection   . Hyperlipidemia   . Radiation    left breast 50.4 gray  . STD (sexually transmitted disease)    HSV    Past Surgical History:  Procedure Laterality Date  . APPENDECTOMY  1961  . BREAST CYST ASPIRATION     x2 right ,x1 left  . COLONOSCOPY  2007   neg  . DILATION AND CURETTAGE OF UTERUS  1975  . FLEXIBLE SIGMOIDOSCOPY     x2  . PELVIC LAPAROSCOPY    . RADIOACTIVE SEED GUIDED PARTIAL MASTECTOMY WITH AXILLARY SENTINEL LYMPH NODE BIOPSY Left 05/31/2015   Procedure: LEFT BREAST RADIOACTIVE SEED GUIDED LUMPECTOMY WITH LEFT AXILLARY SENTINEL LYMPH NODE BIOPSY;  Surgeon: [name], MD;  Location: [facility];  Service: General;  Laterality: Left;  . RE-EXCISION OF BREAST LUMPECTOMY Left 07/01/2015   Procedure: RE-EXCISION OF LEFT BREAST INFERIOR MARGIN;  Surgeon: [name], MD;  Location: [facility];  Service: General;  Laterality: Left;  . UPPER GASTROINTESTINAL ENDOSCOPY    . UPPER GI ENDOSCOPY   10/2010   Dr [name]  . WISDOM TOOTH EXTRACTION  1978    There were no vitals filed for this visit.  Subjective Assessment - 12/19/17 1017    Subjective  felt good after last session for awhile then hurting since ,even waking me up    Currently in Pain?  Yes    Pain Score  2     Pain Location  Arm    Pain Orientation  Right         [facility] PT Assessment - 12/19/17 0001      AROM   AROM Assessment Site  Shoulder    Right/Left Shoulder  Right    Right Shoulder Flexion  170 Degrees    Right Shoulder ABduction  160 Degrees    Right Shoulder Internal Rotation  63 Degrees    Right Shoulder External Rotation  90 Degrees      Strength   Right Shoulder ABduction  4+/5    Right Shoulder External Rotation  4/5                   [facility] Adult PT Treatment/Exercise - 12/19/17 0001      Exercises   Exercises  Shoulder;Neck      Neck Exercises: Machines for Strengthening  UBE (Upper Arm Bike)  3 fwd/3 back L 3      Shoulder Exercises: Seated   External Rotation  Strengthening;Both;20 reps;Weights   4# shld/elbow 90/90   Other Seated Exercises  4# row,ext and horz abd 2 sets 10      Shoulder Exercises: Standing   External Rotation  Strengthening;Right;20 reps   5# pulley wih assistance   Internal Rotation  Strengthening;Right;20 reps   5# pulley   Other Standing Exercises  2# empty cane 2 sets 10   red tband 3 way rhy stab on way 10 each   Other Standing Exercises  3# PNF 2 sets 10   ball vs wall 5 CC and CCW     Modalities   Modalities  Cryotherapy;Electrical Stimulation;Iontophoresis      Cryotherapy   Number Minutes Cryotherapy  15 Minutes    Cryotherapy Location  Shoulder    Type of Cryotherapy  Ice pack      Electrical Stimulation   Electrical Stimulation Location  RT shld    Electrical Stimulation Action  IFC    Electrical Stimulation Goals  Pain      Iontophoresis   Type of Iontophoresis  Dexamethasone    Location  RT shld    Dose  1.1 cc     Time   4 mA leave on patch               PT Short Term Goals - 12/10/17 1047      PT SHORT TERM GOAL #1   Title  I with HEP    Status  Achieved        PT Long Term Goals - 12/19/17 1049      PT LONG TERM GOAL #1   Title  Patient able to don/doff shirt without bending over.    Status  On-going      PT LONG TERM GOAL #2   Title  Patient able to perform ADLS without limitation in ROM    Status  On-going      PT LONG TERM GOAL #3   Title  Patient able to perform ADLs with pain of 2/10 or less in R shoulder.    Status  On-going            Plan - 12/19/17 1050    Clinical Impression Statement  ROM increasing and progressing with goals. as we isolate muscles esp. rotators pt has increased pain and weakness. postural cuing needed with ex    PT Treatment/Interventions  ADLs/Self Care Home Management;Electrical Stimulation;Cryotherapy;Moist Heat;Ultrasound;Patient/family education;Neuromuscular re-education;Therapeutic exercise;Manual techniques;Dry needling;Passive range of motion    PT Next Visit Plan  assess ionto and progress       Patient will benefit from skilled therapeutic intervention in order to improve the following deficits and impairments:  Pain, Decreased strength, Decreased range of motion  Visit Diagnosis: Acute pain of right shoulder  Stiffness of right shoulder, not elsewhere classified     Problem List Patient Active Problem List   Diagnosis Date Noted  . Right rotator cuff tear 10/15/2017  . Acute pain of right shoulder 09/16/2017  . Abnormal urine odor 02/26/2017  . Axillary lymphadenopathy 02/26/2017  . Scalp lesion 02/26/2017  . Tinnitus of both ears 01/01/2017  . Change in bowel habits 10/26/2016  . Reaction to internal stress 05/29/2016  . Prediabetes 08/10/2015  . 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[facility]) 05/20/2015  . Open-angle glaucoma 06/17/2014  . Vitamin D deficiency 10/18/2011  .  HSV infection   .  Atrophic vaginitis   . VARICOSE VEINS, LOWER EXTREMITIES 09/28/2009  . HEADACHE 09/28/2009  . GERD 07/14/2009  . NONSPECIFIC ABNORMAL ELECTROCARDIOGRAM 07/14/2009  . Hyperlipidemia 12/26/2006  . GILBERT'S SYNDROME 08/09/2006  . DEGENERATIVE DISC DISEASE 08/09/2006    Savannha Welle,ANGIE PTA 12/19/2017, 10:52 AM  [address] [place], [place], [address] Phone: [phone]   Fax:  [phone]  Name: NORETA KUE MRN: [id number] Date of Birth: [birth year]

## 2017-12-24 ENCOUNTER — Ambulatory Visit: Payer: Medicare Other | Admitting: Physical Therapy

## 2017-12-24 ENCOUNTER — Encounter: Payer: Self-pay | Admitting: Physical Therapy

## 2017-12-24 DIAGNOSIS — M25511 Pain in right shoulder: Secondary | ICD-10-CM

## 2017-12-24 DIAGNOSIS — M25611 Stiffness of right shoulder, not elsewhere classified: Secondary | ICD-10-CM

## 2017-12-24 NOTE — Therapy (Signed)
[place], [place], [address] Phone: [phone]   Fax:  [phone]  Physical Therapy Treatment  Patient Details  Name: Lindsey Barrett MRN: [id number] Date of Birth: 27-Sep-1945 Referring Provider: Hulan Saas   Encounter Date: 12/24/2017  PT End of Session - 12/24/17 1312    Visit Number  6    Number of Visits  12    Date for PT Re-Evaluation  01/10/18    PT Start Time  1230    PT Stop Time  1330    PT Time Calculation (min)  60 min       Past Medical History:  Diagnosis Date  . Anxiety   . Atrophic vaginitis   . Breast cancer ([facility])   . 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[facility]) 05/20/2015  . Breast cyst    x3  . DDD (degenerative disc disease)   . Diverticulosis   . Fasting hyperglycemia   . GERD (gastroesophageal reflux disease)   . Gilbert syndrome   . Glaucoma   . History of hiatal hernia   . HSV infection   . Hyperlipidemia   . Radiation    left breast 50.4 gray  . STD (sexually transmitted disease)    HSV    Past Surgical History:  Procedure Laterality Date  . APPENDECTOMY  1961  . BREAST CYST ASPIRATION     x2 right ,x1 left  . COLONOSCOPY  2007   neg  . DILATION AND CURETTAGE OF UTERUS  1975  . FLEXIBLE SIGMOIDOSCOPY     x2  . PELVIC LAPAROSCOPY    . RADIOACTIVE SEED GUIDED PARTIAL MASTECTOMY WITH AXILLARY SENTINEL LYMPH NODE BIOPSY Left 05/31/2015   Procedure: LEFT BREAST RADIOACTIVE SEED GUIDED LUMPECTOMY WITH LEFT AXILLARY SENTINEL LYMPH NODE BIOPSY;  Surgeon: [name], MD;  Location: [facility];  Service: General;  Laterality: Left;  . RE-EXCISION OF BREAST LUMPECTOMY Left 07/01/2015   Procedure: RE-EXCISION OF LEFT BREAST INFERIOR MARGIN;  Surgeon: [name], MD;  Location: [facility];  Service: General;  Laterality: Left;  . UPPER GASTROINTESTINAL ENDOSCOPY    . UPPER GI ENDOSCOPY   10/2010   Dr [name]  . WISDOM TOOTH EXTRACTION  1978    There were no vitals filed for this visit.  Subjective Assessment - 12/24/17 1238    Subjective  sleeping is very hard, left shld bothering me too. ionto patch helped    Currently in Pain?  Yes    Pain Score  4     Pain Location  Shoulder    Pain Orientation  Right                       [facility] Adult PT Treatment/Exercise - 12/24/17 0001      Exercises   Exercises  Shoulder;Neck      Neck Exercises: Machines for Strengthening   UBE (Upper Arm Bike)  3 fwd/3 back L 3    Cybex Row  25# 2 SETS 15    Lat Pull  20# 2 sets 15       Shoulder Exercises: Supine   Horizontal ABduction  Strengthening;Both;20 reps;Weights   3#   Flexion  Strengthening;Both;20 reps;Weights   3#     Shoulder Exercises: Seated   External Rotation  Strengthening;Both;20 reps;Weights    External Rotation Weight (lbs)  3      Shoulder Exercises: Standing  External Rotation  Strengthening;Both;15 reps;Weights    External Rotation Weight (lbs)  3    Internal Rotation  Strengthening;Right;20 reps    Other Standing Exercises  wall angles 10 times, 3# rhy stab 10 each   ball vs wall 5 CC and CCW   Other Standing Exercises  3# PNF 2 sets 10      Shoulder Exercises: ROM/Strengthening   Nustep  push/pull 2 min, horz abd 2 min   RT UE only standing     Modalities   Modalities  Cryotherapy;Electrical Stimulation;Iontophoresis      Cryotherapy   Number Minutes Cryotherapy  15 Minutes    Cryotherapy Location  Shoulder    Type of Cryotherapy  Ice pack      Electrical Stimulation   Electrical Stimulation Location  RT shld    Electrical Stimulation Action  IFC    Electrical Stimulation Goals  Pain      Iontophoresis   Type of Iontophoresis  Dexamethasone    Location  RT shld    Dose  1.1 cc     Time  4 mA leave on patch               PT Short Term Goals - 12/10/17 1047      PT SHORT TERM GOAL #1   Title  I with HEP     Status  Achieved        PT Long Term Goals - 12/19/17 1049      PT LONG TERM GOAL #1   Title  Patient able to don/doff shirt without bending over.    Status  On-going      PT LONG TERM GOAL #2   Title  Patient able to perform ADLS without limitation in ROM    Status  On-going      PT LONG TERM GOAL #3   Title  Patient able to perform ADLs with pain of 2/10 or less in R shoulder.    Status  On-going            Plan - 12/24/17 1312    Clinical Impression Statement  pain and difficulty with abd and horizonatl abd. focus in RTC strength. pt getting relief with modalities    PT Treatment/Interventions  ADLs/Self Care Home Management;Electrical Stimulation;Cryotherapy;Moist Heat;Ultrasound;Patient/family education;Neuromuscular re-education;Therapeutic exercise;Manual techniques;Dry needling;Passive range of motion    PT Next Visit Plan  assess goals and progress       Patient will benefit from skilled therapeutic intervention in order to improve the following deficits and impairments:  Pain, Decreased strength, Decreased range of motion  Visit Diagnosis: Acute pain of right shoulder  Stiffness of right shoulder, not elsewhere classified     Problem List Patient Active Problem List   Diagnosis Date Noted  . Right rotator cuff tear 10/15/2017  . Acute pain of right shoulder 09/16/2017  . Abnormal urine odor 02/26/2017  . Axillary lymphadenopathy 02/26/2017  . Scalp lesion 02/26/2017  . Tinnitus of both ears 01/01/2017  . Change in bowel habits 10/26/2016  . Reaction to internal stress 05/29/2016  . Prediabetes 08/10/2015  . 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[facility]) 05/20/2015  . Open-angle glaucoma 06/17/2014  . Vitamin D deficiency 10/18/2011  . HSV infection   . Atrophic vaginitis   . VARICOSE VEINS, LOWER EXTREMITIES 09/28/2009  . HEADACHE 09/28/2009  . GERD 07/14/2009  . NONSPECIFIC ABNORMAL ELECTROCARDIOGRAM 07/14/2009  .  Hyperlipidemia 12/26/2006  . GILBERT'S SYNDROME 08/09/2006  . DEGENERATIVE DISC  DISEASE 08/09/2006    PAYSEUR,ANGIE PTA 12/24/2017, 1:14 PM  [place], [place], [address] Phone: [phone]   Fax:  [phone]  Name: Lindsey Barrett MRN: [id number] Date of Birth: May 25, 1945

## 2017-12-26 ENCOUNTER — Other Ambulatory Visit: Payer: Self-pay | Admitting: Gynecology

## 2017-12-26 ENCOUNTER — Ambulatory Visit (INDEPENDENT_AMBULATORY_CARE_PROVIDER_SITE_OTHER): Payer: Medicare Other

## 2017-12-26 DIAGNOSIS — Z780 Asymptomatic menopausal state: Secondary | ICD-10-CM | POA: Diagnosis not present

## 2017-12-26 DIAGNOSIS — Z853 Personal history of malignant neoplasm of breast: Secondary | ICD-10-CM

## 2017-12-27 ENCOUNTER — Encounter: Payer: Self-pay | Admitting: Physical Therapy

## 2017-12-27 ENCOUNTER — Ambulatory Visit: Payer: Medicare Other | Admitting: Physical Therapy

## 2017-12-27 ENCOUNTER — Encounter: Payer: Self-pay | Admitting: Gynecology

## 2017-12-27 DIAGNOSIS — M25511 Pain in right shoulder: Secondary | ICD-10-CM

## 2017-12-27 DIAGNOSIS — M25611 Stiffness of right shoulder, not elsewhere classified: Secondary | ICD-10-CM

## 2017-12-27 NOTE — Therapy (Signed)
[place], [place], [address] Phone: [phone]   Fax:  [phone]  Physical Therapy Treatment  Patient Details  Name: Lindsey Barrett MRN: [id number] Date of Birth: 03-22-1946 Referring Provider: Hulan Saas   Encounter Date: 12/27/2017  PT End of Session - 12/27/17 0917    Visit Number  7    Number of Visits  12    Date for PT Re-Evaluation  01/10/18    PT Start Time  0852    PT Stop Time  0950    PT Time Calculation (min)  58 min       Past Medical History:  Diagnosis Date  . Anxiety   . Atrophic vaginitis   . Breast cancer ([facility])   . 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[facility]) 05/20/2015  . Breast cyst    x3  . DDD (degenerative disc disease)   . Diverticulosis   . Fasting hyperglycemia   . GERD (gastroesophageal reflux disease)   . Gilbert syndrome   . Glaucoma   . History of hiatal hernia   . HSV infection   . Hyperlipidemia   . Radiation    left breast 50.4 gray  . STD (sexually transmitted disease)    HSV    Past Surgical History:  Procedure Laterality Date  . APPENDECTOMY  1961  . BREAST CYST ASPIRATION     x2 right ,x1 left  . COLONOSCOPY  2007   neg  . DILATION AND CURETTAGE OF UTERUS  1975  . FLEXIBLE SIGMOIDOSCOPY     x2  . PELVIC LAPAROSCOPY    . RADIOACTIVE SEED GUIDED PARTIAL MASTECTOMY WITH AXILLARY SENTINEL LYMPH NODE BIOPSY Left 05/31/2015   Procedure: LEFT BREAST RADIOACTIVE SEED GUIDED LUMPECTOMY WITH LEFT AXILLARY SENTINEL LYMPH NODE BIOPSY;  Surgeon: [name], MD;  Location: [facility];  Service: General;  Laterality: Left;  . RE-EXCISION OF BREAST LUMPECTOMY Left 07/01/2015   Procedure: RE-EXCISION OF LEFT BREAST INFERIOR MARGIN;  Surgeon: [name], MD;  Location: [facility];  Service: General;  Laterality: Left;  . UPPER GASTROINTESTINAL ENDOSCOPY    . UPPER GI ENDOSCOPY   10/2010   Dr [name]  . WISDOM TOOTH EXTRACTION  1978    There were no vitals filed for this visit.  Subjective Assessment - 12/27/17 0853    Subjective  was feeling pretty good until it woke me up this morning    Currently in Pain?  No/denies         [facility] PT Assessment - 12/27/17 0001      AROM   Right Shoulder Flexion  170 Degrees    Right Shoulder ABduction  161 Degrees    Right Shoulder Internal Rotation  64 Degrees    Right Shoulder External Rotation  90 Degrees                   [facility] Adult PT Treatment/Exercise - 12/27/17 0001      Exercises   Exercises  Shoulder;Neck      Neck Exercises: Machines for Strengthening   UBE (Upper Arm Bike)  3 fwd/3 back L 3    Cybex Row  25# 2 SETS 15    Lat Pull  25# 2 sets 15       Shoulder Exercises: Supine   Other Supine Exercises  4# IR/ER 2 sets 10   issued for HEP to help with ROM/strength- pt demo  and VU     Shoulder Exercises: Seated   Other Seated Exercises  4# row,ext and horz abd 2 sets 10      Shoulder Exercises: Standing   Other Standing Exercises  wall angles 10 times, 3# rhy stab 10 each   ball vs wall CW and CCW. IR/ER 90/90 2 sets 10 PTA Assist   Other Standing Exercises  3# PNF 2 sets 10   3# empty can 2 sets 10 BIL. 3# behind back pass     Cryotherapy   Number Minutes Cryotherapy  15 Minutes    Cryotherapy Location  Shoulder    Type of Cryotherapy  Ice pack      Electrical Stimulation   Electrical Stimulation Location  RT shld    Electrical Stimulation Action  IFC    Electrical Stimulation Goals  Pain      Iontophoresis   Type of Iontophoresis  Dexamethasone    Location  RT shld    Dose  1.1 cc     Time  4 mA leave on patch #3               PT Short Term Goals - 12/10/17 1047      PT SHORT TERM GOAL #1   Title  I with HEP    Status  Achieved        PT Long Term Goals - 12/27/17 0865      PT LONG TERM GOAL #1   Title  Patient able to don/doff shirt without bending  over.    Status  Partially Met      PT LONG TERM GOAL #2   Title  Patient able to perform ADLS without limitation in ROM    Status  Partially Met      PT LONG TERM GOAL #3   Title  Patient able to perform ADLs with pain of 2/10 or less in R shoulder.    Status  Partially Met            Plan - 12/27/17 0918    Clinical Impression Statement  focus session on mvmt that increase pain to strengthen with cuing. ROM is good minus IR limitation but pt is weak esp with muscle isolation. progressing towards goals    PT Treatment/Interventions  ADLs/Self Care Home Management;Electrical Stimulation;Cryotherapy;Moist Heat;Ultrasound;Patient/family education;Neuromuscular re-education;Therapeutic exercise;Manual techniques;Dry needling;Passive range of motion    PT Next Visit Plan  progress strength       Patient will benefit from skilled therapeutic intervention in order to improve the following deficits and impairments:  Pain, Decreased strength, Decreased range of motion  Visit Diagnosis: Acute pain of right shoulder  Stiffness of right shoulder, not elsewhere classified     Problem List Patient Active Problem List   Diagnosis Date Noted  . Right rotator cuff tear 10/15/2017  . Acute pain of right shoulder 09/16/2017  . Abnormal urine odor 02/26/2017  . Axillary lymphadenopathy 02/26/2017  . Scalp lesion 02/26/2017  . Tinnitus of both ears 01/01/2017  . Change in bowel habits 10/26/2016  . Reaction to internal stress 05/29/2016  . Prediabetes 08/10/2015  . 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[facility]) 05/20/2015  . Open-angle glaucoma 06/17/2014  . Vitamin D deficiency 10/18/2011  . HSV infection   . Atrophic vaginitis   . VARICOSE VEINS, LOWER EXTREMITIES 09/28/2009  . HEADACHE 09/28/2009  . GERD 07/14/2009  . NONSPECIFIC ABNORMAL ELECTROCARDIOGRAM 07/14/2009  . Hyperlipidemia 12/26/2006  . GILBERT'S SYNDROME 08/09/2006  .  DEGENERATIVE DISC DISEASE  08/09/2006    Claretta Kendra,ANGIE PTA 12/27/2017, 9:20 AM  [place] [address] [place] [address] [place], [place], [address] Phone: [phone]   Fax:  [phone]  Name: Lindsey Barrett MRN: [id number] Date of Birth: [birth year]

## 2017-12-31 ENCOUNTER — Encounter: Payer: Self-pay | Admitting: Physical Therapy

## 2017-12-31 ENCOUNTER — Ambulatory Visit: Payer: Medicare Other | Admitting: Physical Therapy

## 2017-12-31 DIAGNOSIS — M25511 Pain in right shoulder: Secondary | ICD-10-CM

## 2017-12-31 DIAGNOSIS — M25611 Stiffness of right shoulder, not elsewhere classified: Secondary | ICD-10-CM | POA: Diagnosis not present

## 2017-12-31 NOTE — Therapy (Signed)
[facility], [place], [address] Phone: [phone]   Fax:  [phone]  Physical Therapy Treatment  Patient Details  Name: Lindsey Barrett MRN: [id number] Date of Birth: Feb 25, 1946 Referring Provider: Hulan Saas   Encounter Date: 12/31/2017  PT End of Session - 12/31/17 1054    Visit Number  8    Number of Visits  12    Date for PT Re-Evaluation  01/10/18    PT Start Time  1019    PT Stop Time  1112    PT Time Calculation (min)  53 min       Past Medical History:  Diagnosis Date  . Anxiety   . Atrophic vaginitis   . Breast cancer ([facility])   . 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[facility]) 05/20/2015  . Breast cyst    x3  . DDD (degenerative disc disease)   . Diverticulosis   . Fasting hyperglycemia   . GERD (gastroesophageal reflux disease)   . Gilbert syndrome   . Glaucoma   . History of hiatal hernia   . HSV infection   . Hyperlipidemia   . Radiation    left breast 50.4 gray  . STD (sexually transmitted disease)    HSV    Past Surgical History:  Procedure Laterality Date  . APPENDECTOMY  1961  . BREAST CYST ASPIRATION     x2 right ,x1 left  . COLONOSCOPY  2007   neg  . DILATION AND CURETTAGE OF UTERUS  1975  . FLEXIBLE SIGMOIDOSCOPY     x2  . PELVIC LAPAROSCOPY    . RADIOACTIVE SEED GUIDED PARTIAL MASTECTOMY WITH AXILLARY SENTINEL LYMPH NODE BIOPSY Left 05/31/2015   Procedure: LEFT BREAST RADIOACTIVE SEED GUIDED LUMPECTOMY WITH LEFT AXILLARY SENTINEL LYMPH NODE BIOPSY;  Surgeon: [name], MD;  Location: [facility];  Service: General;  Laterality: Left;  . RE-EXCISION OF BREAST LUMPECTOMY Left 07/01/2015   Procedure: RE-EXCISION OF LEFT BREAST INFERIOR MARGIN;  Surgeon: [name], MD;  Location: [facility];  Service: General;  Laterality: Left;  . UPPER GASTROINTESTINAL ENDOSCOPY    . UPPER GI ENDOSCOPY   10/2010   Dr [name]  . WISDOM TOOTH EXTRACTION  1978    There were no vitals filed for this visit.  Subjective Assessment - 12/31/17 1021    Subjective  left patch on 4 hours too long and broke out. good and bad days- slow progress    Currently in Pain?  Yes    Pain Score  2     Pain Location  Arm    Pain Orientation  Right;Upper         [facility] PT Assessment - 12/31/17 0001      AROM   Right Shoulder Flexion  176 Degrees    Right Shoulder ABduction  172 Degrees    Right Shoulder Internal Rotation  71 Degrees                   [facility] Adult PT Treatment/Exercise - 12/31/17 0001      Exercises   Exercises  Shoulder;Neck      Neck Exercises: Machines for Strengthening   UBE (Upper Arm Bike)  3 fwd/3 back L 3      Shoulder Exercises: Seated   Other Seated Exercises  4# row,ext and horz abd 2 sets 15      Shoulder Exercises: Standing   Flexion  Strengthening;Right;15 reps;Theraband    Theraband Level (Shoulder Flexion)  Level 2 (Red)    ABduction  Strengthening;Right;15 reps;Theraband    Theraband Level (Shoulder ABduction)  Level 2 (Red)    Diagonals  Strengthening;Right;Theraband    Theraband Level (Shoulder Diagonals)  Level 2 (Red)    Other Standing Exercises  red tband 3 way stab 15 each    Other Standing Exercises  3# PNF 2 sets 10      Shoulder Exercises: ROM/Strengthening   Cybex Press  20 reps;2 plate   with serratus     Cryotherapy   Number Minutes Cryotherapy  15 Minutes    Cryotherapy Location  Shoulder    Type of Cryotherapy  Ice pack      Electrical Stimulation   Electrical Stimulation Location  rt shld    Electrical Stimulation Action  IFC    Electrical Stimulation Goals  Pain      Manual Therapy   Manual Therapy  Joint mobilization;Passive ROM    Joint Mobilization  joint capsule stretch    Passive ROM  Rt shld seated all directions   pain at end range all mothions except ER              PT Short Term Goals - 12/10/17 1047       PT SHORT TERM GOAL #1   Title  I with HEP    Status  Achieved        PT Long Term Goals - 12/27/17 8341      PT LONG TERM GOAL #1   Title  Patient able to don/doff shirt without bending over.    Status  Partially Met      PT LONG TERM GOAL #2   Title  Patient able to perform ADLS without limitation in ROM    Status  Partially Met      PT LONG TERM GOAL #3   Title  Patient able to perform ADLs with pain of 2/10 or less in R shoulder.    Status  Partially Met            Plan - 12/31/17 1054    Clinical Impression Statement  painful with end range PROM ,but after MT and jt mobs increased func and ROM. progressing with strength. overall decreased intensity and consistancy of pain    PT Treatment/Interventions  ADLs/Self Care Home Management;Electrical Stimulation;Cryotherapy;Moist Heat;Ultrasound;Patient/family education;Neuromuscular re-education;Therapeutic exercise;Manual techniques;Dry needling;Passive range of motion    PT Next Visit Plan  progress strength       Patient will benefit from skilled therapeutic intervention in order to improve the following deficits and impairments:  Pain, Decreased strength, Decreased range of motion  Visit Diagnosis: Acute pain of right shoulder  Stiffness of right shoulder, not elsewhere classified     Problem List Patient Active Problem List   Diagnosis Date Noted  . Right rotator cuff tear 10/15/2017  . Acute pain of right shoulder 09/16/2017  . Abnormal urine odor 02/26/2017  . Axillary lymphadenopathy 02/26/2017  . Scalp lesion 02/26/2017  . Tinnitus of both ears 01/01/2017  . Change in bowel habits 10/26/2016  . Reaction to internal stress 05/29/2016  . Prediabetes 08/10/2015  . 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[facility]) 05/20/2015  . Open-angle glaucoma 06/17/2014  . Vitamin D deficiency 10/18/2011  . HSV infection   . Atrophic vaginitis   . VARICOSE VEINS, LOWER EXTREMITIES 09/28/2009   . HEADACHE 09/28/2009  . GERD 07/14/2009  . NONSPECIFIC ABNORMAL ELECTROCARDIOGRAM  07/14/2009  . Hyperlipidemia 12/26/2006  . GILBERT'S SYNDROME 08/09/2006  . DEGENERATIVE DISC DISEASE 08/09/2006    Harpreet Signore,ANGIE  PTA 12/31/2017, 10:56 AM  [place] [address] [place], [place], [address] Phone: [phone]   Fax:  [phone]  Name: Lindsey Barrett MRN: [id number] Date of Birth: 1945/06/21

## 2018-01-02 DIAGNOSIS — H04123 Dry eye syndrome of bilateral lacrimal glands: Secondary | ICD-10-CM | POA: Diagnosis not present

## 2018-01-02 DIAGNOSIS — Z853 Personal history of malignant neoplasm of breast: Secondary | ICD-10-CM | POA: Diagnosis not present

## 2018-01-02 DIAGNOSIS — H15833 Staphyloma posticum, bilateral: Secondary | ICD-10-CM | POA: Diagnosis not present

## 2018-01-02 DIAGNOSIS — H4423 Degenerative myopia, bilateral: Secondary | ICD-10-CM | POA: Diagnosis not present

## 2018-01-02 DIAGNOSIS — H25013 Cortical age-related cataract, bilateral: Secondary | ICD-10-CM | POA: Diagnosis not present

## 2018-01-02 DIAGNOSIS — H53451 Other localized visual field defect, right eye: Secondary | ICD-10-CM | POA: Diagnosis not present

## 2018-01-03 ENCOUNTER — Ambulatory Visit: Payer: Medicare Other | Admitting: Physical Therapy

## 2018-01-03 ENCOUNTER — Encounter: Payer: Self-pay | Admitting: Physical Therapy

## 2018-01-03 DIAGNOSIS — M25511 Pain in right shoulder: Secondary | ICD-10-CM

## 2018-01-03 DIAGNOSIS — M25611 Stiffness of right shoulder, not elsewhere classified: Secondary | ICD-10-CM | POA: Diagnosis not present

## 2018-01-03 NOTE — Therapy (Signed)
[place], [place], [address] Phone: [phone]   Fax:  [phone]  Physical Therapy Treatment  Patient Details  Name: Lindsey Barrett MRN: [id number] Date of Birth: Jul 05, 1945 Referring Provider: Hulan Saas   Encounter Date: 01/03/2018  PT End of Session - 01/03/18 1036    Visit Number  9    Number of Visits  12    Date for PT Re-Evaluation  01/10/18    PT Start Time  1011    PT Stop Time  1110    PT Time Calculation (min)  59 min       Past Medical History:  Diagnosis Date  . Anxiety   . Atrophic vaginitis   . Breast cancer ([facility])   . 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[facility]) 05/20/2015  . Breast cyst    x3  . DDD (degenerative disc disease)   . Diverticulosis   . Fasting hyperglycemia   . GERD (gastroesophageal reflux disease)   . Gilbert syndrome   . Glaucoma   . History of hiatal hernia   . HSV infection   . Hyperlipidemia   . Radiation    left breast 50.4 gray  . STD (sexually transmitted disease)    HSV    Past Surgical History:  Procedure Laterality Date  . APPENDECTOMY  1961  . BREAST CYST ASPIRATION     x2 right ,x1 left  . COLONOSCOPY  2007   neg  . DILATION AND CURETTAGE OF UTERUS  1975  . FLEXIBLE SIGMOIDOSCOPY     x2  . PELVIC LAPAROSCOPY    . RADIOACTIVE SEED GUIDED PARTIAL MASTECTOMY WITH AXILLARY SENTINEL LYMPH NODE BIOPSY Left 05/31/2015   Procedure: LEFT BREAST RADIOACTIVE SEED GUIDED LUMPECTOMY WITH LEFT AXILLARY SENTINEL LYMPH NODE BIOPSY;  Surgeon: [name], MD;  Location: [facility];  Service: General;  Laterality: Left;  . RE-EXCISION OF BREAST LUMPECTOMY Left 07/01/2015   Procedure: RE-EXCISION OF LEFT BREAST INFERIOR MARGIN;  Surgeon: [name], MD;  Location: [facility];  Service: General;  Laterality: Left;  . UPPER GASTROINTESTINAL ENDOSCOPY    . UPPER GI ENDOSCOPY   10/2010   Dr [name]  . WISDOM TOOTH EXTRACTION  1978    There were no vitals filed for this visit.  Subjective Assessment - 01/03/18 1014    Subjective  felt great for past 3 days    Currently in Pain?  Yes    Pain Score  1     Pain Location  Shoulder    Pain Orientation  Right                       [facility] Adult PT Treatment/Exercise - 01/03/18 0001      Neck Exercises: Machines for Strengthening   UBE (Upper Arm Bike)  3 fwd/3 back L 3    Cybex Row  25# 2 SETS 15    Lat Pull  25# 2 sets 15       Shoulder Exercises: Seated   External Rotation  Strengthening;Right;15 reps;Weights   2 sets ,arm on ball   External Rotation Weight (lbs)  3    Other Seated Exercises  5# row,ext and horz abd 2 sets 15      Shoulder Exercises: Standing   External Rotation  Strengthening;20 reps;Right   5# pulley -PTA assist for increased ROM   Internal Rotation  Strengthening;Right;20 reps  5# pulley   Other Standing Exercises  pulleys ext and retraction 10# 2 sets 10 each   wt ball toss fwd,chest press abd and ER 10 each   Other Standing Exercises  3# PNF 2 sets 12      Shoulder Exercises: ROM/Strengthening   Cybex Press  2 plate;15 reps   2 sets     Cryotherapy   Number Minutes Cryotherapy  15 Minutes    Cryotherapy Location  Shoulder    Type of Cryotherapy  Ice pack      Electrical Stimulation   Electrical Stimulation Location  rt shld    Electrical Stimulation Action  IFC    Electrical Stimulation Goals  Pain               PT Short Term Goals - 12/10/17 1047      PT SHORT TERM GOAL #1   Title  I with HEP    Status  Achieved        PT Long Term Goals - 12/27/17 4193      PT LONG TERM GOAL #1   Title  Patient able to don/doff shirt without bending over.    Status  Partially Met      PT LONG TERM GOAL #2   Title  Patient able to perform ADLS without limitation in ROM    Status  Partially Met      PT LONG TERM GOAL #3   Title  Patient able to  perform ADLs with pain of 2/10 or less in R shoulder.    Status  Partially Met            Plan - 01/03/18 1037    Clinical Impression Statement  progressing RTC strengthening and pt is tolerating well. at times still requiring cuing for posture and control of mvmt    PT Treatment/Interventions  ADLs/Self Care Home Management;Electrical Stimulation;Cryotherapy;Moist Heat;Ultrasound;Patient/family education;Neuromuscular re-education;Therapeutic exercise;Manual techniques;Dry needling;Passive range of motion    PT Next Visit Plan  progress strength       Patient will benefit from skilled therapeutic intervention in order to improve the following deficits and impairments:  Pain, Decreased strength, Decreased range of motion  Visit Diagnosis: Acute pain of right shoulder  Stiffness of right shoulder, not elsewhere classified     Problem List Patient Active Problem List   Diagnosis Date Noted  . Right rotator cuff tear 10/15/2017  . Acute pain of right shoulder 09/16/2017  . Abnormal urine odor 02/26/2017  . Axillary lymphadenopathy 02/26/2017  . Scalp lesion 02/26/2017  . Tinnitus of both ears 01/01/2017  . Change in bowel habits 10/26/2016  . Reaction to internal stress 05/29/2016  . Prediabetes 08/10/2015  . 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[facility]) 05/20/2015  . Open-angle glaucoma 06/17/2014  . Vitamin D deficiency 10/18/2011  . HSV infection   . Atrophic vaginitis   . VARICOSE VEINS, LOWER EXTREMITIES 09/28/2009  . HEADACHE 09/28/2009  . GERD 07/14/2009  . NONSPECIFIC ABNORMAL ELECTROCARDIOGRAM 07/14/2009  . Hyperlipidemia 12/26/2006  . GILBERT'S SYNDROME 08/09/2006  . DEGENERATIVE DISC DISEASE 08/09/2006    PAYSEUR,ANGIE PTA 01/03/2018, 10:51 AM  [place] [address] [place], [place], [address] Phone: [phone]   Fax:  [phone]  Name: Lindsey Barrett MRN:  [id number] Date of Birth: [birth year]

## 2018-01-07 ENCOUNTER — Ambulatory Visit: Payer: Medicare Other | Attending: Family Medicine | Admitting: Physical Therapy

## 2018-01-07 ENCOUNTER — Encounter: Payer: Self-pay | Admitting: Physical Therapy

## 2018-01-07 DIAGNOSIS — R2232 Localized swelling, mass and lump, left upper limb: Secondary | ICD-10-CM | POA: Diagnosis not present

## 2018-01-07 DIAGNOSIS — M25611 Stiffness of right shoulder, not elsewhere classified: Secondary | ICD-10-CM | POA: Diagnosis not present

## 2018-01-07 DIAGNOSIS — M25511 Pain in right shoulder: Secondary | ICD-10-CM | POA: Insufficient documentation

## 2018-01-07 NOTE — Therapy (Signed)
[facility] [place], [place], [address] Phone: [phone]   Fax:  [phone]  Physical Therapy Treatment  Patient Details  Name: Lindsey Barrett MRN: [id number] Date of Birth: July 10, 1945 Referring Provider: Hulan Saas   Encounter Date: 01/07/2018  PT End of Session - 01/07/18 1701    Visit Number  10    Date for PT Re-Evaluation  02/09/18    PT Start Time  1528    PT Stop Time  1625    PT Time Calculation (min)  57 min    Activity Tolerance  Patient tolerated treatment well    Behavior During Therapy  [facility] for tasks assessed/performed       Past Medical History:  Diagnosis Date  . Anxiety   . Atrophic vaginitis   . Breast cancer ([place])   . 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[place]) 05/20/2015  . Breast cyst    x3  . DDD (degenerative disc disease)   . Diverticulosis   . Fasting hyperglycemia   . GERD (gastroesophageal reflux disease)   . Gilbert syndrome   . Glaucoma   . History of hiatal hernia   . HSV infection   . Hyperlipidemia   . Radiation    left breast 50.4 gray  . STD (sexually transmitted disease)    HSV    Past Surgical History:  Procedure Laterality Date  . APPENDECTOMY  1961  . BREAST CYST ASPIRATION     x2 right ,x1 left  . COLONOSCOPY  2007   neg  . DILATION AND CURETTAGE OF UTERUS  1975  . FLEXIBLE SIGMOIDOSCOPY     x2  . PELVIC LAPAROSCOPY    . RADIOACTIVE SEED GUIDED PARTIAL MASTECTOMY WITH AXILLARY SENTINEL LYMPH NODE BIOPSY Left 05/31/2015   Procedure: LEFT BREAST RADIOACTIVE SEED GUIDED LUMPECTOMY WITH LEFT AXILLARY SENTINEL LYMPH NODE BIOPSY;  Surgeon: [name], MD;  Location: [facility];  Service: General;  Laterality: Left;  . RE-EXCISION OF BREAST LUMPECTOMY Left 07/01/2015   Procedure: RE-EXCISION OF LEFT BREAST INFERIOR MARGIN;  Surgeon: [name], MD;  Location: [facility];  Service:  General;  Laterality: Left;  . UPPER GASTROINTESTINAL ENDOSCOPY    . UPPER GI ENDOSCOPY  10/2010   Dr [name]  . WISDOM TOOTH EXTRACTION  1978    There were no vitals filed for this visit.  Subjective Assessment - 01/07/18 1528    Subjective  Patient reports that she has done really well since Friday, reports that this AM she started having lateral shoulder pain.    Currently in Pain?  Yes    Pain Score  2     Pain Location  Shoulder    Pain Orientation  Right;Posterior;Lateral    Pain Descriptors / Indicators  Aching    Aggravating Factors   unsure of why but had ache this AM                       [facility] Adult PT Treatment/Exercise - 01/07/18 0001      Neck Exercises: Machines for Strengthening   UBE (Upper Arm Bike)  3 fwd/3 back L 4    Cybex Row  25# 2 SETS 15    Lat Pull  25# 2 sets 15       Shoulder Exercises: Seated   External Rotation  Strengthening;Right;15 reps;Weights    External Rotation Weight (lbs)  3  Other Seated Exercises  5# row,ext and horz abd 2 sets 15      Shoulder Exercises: Standing   Other Standing Exercises  pulleys ext and retraction 10# 2 sets 10 each    Other Standing Exercises  weighted ball around waist both directions, weighted ball overhead carry,       Cryotherapy   Number Minutes Cryotherapy  15 Minutes    Cryotherapy Location  Shoulder    Type of Cryotherapy  Ice pack      Electrical Stimulation   Electrical Stimulation Location  rt shld    Electrical Stimulation Action  IFC    Electrical Stimulation Goals  Pain      Manual Therapy   Manual Therapy  Passive ROM    Passive ROM  PROM with some contract relax to stretch capsule at end range, horizontal adduction               PT Short Term Goals - 12/10/17 1047      PT SHORT TERM GOAL #1   Title  I with HEP    Status  Achieved        PT Long Term Goals - 01/07/18 1704      PT LONG TERM GOAL #1   Title  Patient able to don/doff shirt without bending  over.    Status  Partially Met      PT LONG TERM GOAL #2   Title  Patient able to perform ADLS without limitation in ROM    Status  Partially Met      PT LONG TERM GOAL #3   Title  Patient able to perform ADLs with pain of 2/10 or less in R shoulder.    Status  Partially Met            Plan - 01/07/18 1701    Clinical Impression Statement  Patient overall doing well, she reports having less pain and able to have better ROM.  She still has a slight limitation in IR, has difficulty doing bra and taking off shirt, she has pain with ER and some reaching overhead    PT Next Visit Plan  would like to advance her to a safe return to the gym    Consulted and Agree with Plan of Care  Patient       Patient will benefit from skilled therapeutic intervention in order to improve the following deficits and impairments:  Pain, Decreased strength, Decreased range of motion  Visit Diagnosis: Acute pain of right shoulder  Stiffness of right shoulder, not elsewhere classified     Problem List Patient Active Problem List   Diagnosis Date Noted  . Right rotator cuff tear 10/15/2017  . Acute pain of right shoulder 09/16/2017  . Abnormal urine odor 02/26/2017  . Axillary lymphadenopathy 02/26/2017  . Scalp lesion 02/26/2017  . Tinnitus of both ears 01/01/2017  . Change in bowel habits 10/26/2016  . Reaction to internal stress 05/29/2016  . Prediabetes 08/10/2015  . 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[place]) 05/20/2015  . Open-angle glaucoma 06/17/2014  . Vitamin D deficiency 10/18/2011  . HSV infection   . Atrophic vaginitis   . VARICOSE VEINS, LOWER EXTREMITIES 09/28/2009  . HEADACHE 09/28/2009  . GERD 07/14/2009  . NONSPECIFIC ABNORMAL ELECTROCARDIOGRAM 07/14/2009  . Hyperlipidemia 12/26/2006  . GILBERT'S SYNDROME 08/09/2006  . DEGENERATIVE DISC DISEASE 08/09/2006    [name], PT 01/07/2018, 5:05 PM  [facility]  [place], [place], [address] Phone: [phone]   Fax:  [phone]  Name: MATTI KILLINGSWORTH MRN: [id number] Date of Birth: Apr 13, 1946

## 2018-01-09 ENCOUNTER — Ambulatory Visit: Payer: Medicare Other

## 2018-01-09 NOTE — Progress Notes (Addendum)
Subjective:   ABBIEGAIL Barrett is a 72 y.o. female who presents for Medicare Annual (Subsequent) preventive examination.  Review of Systems:  No ROS.  Medicare Wellness Visit. Additional risk factors are reflected in the social history.  Cardiac Risk Factors include: advanced age (>48men, >50 women);dyslipidemia Sleep patterns: feels rested on waking, gets up 1-2 times nightly to void and sleeps 6 hours nightly.  Patient reports she goes to bed late, discussed recommended sleep tips.   Home Safety/Smoke Alarms: Feels safe in home. Smoke alarms in place.  Living environment; residence and Firearm Safety: 1-story house/ trailer, no firearms Lives alone, no needs for DME, good support system. Seat Belt Safety/Bike Helmet: Wears seat belt.      Objective:     Vitals: BP 126/64   Pulse 72   Temp 98.4 F (36.9 C)   Resp 17   Ht 5\' 5"  (1.651 m)   Wt 174 lb (78.9 kg)   SpO2 98%   BMI 28.96 kg/m   Body mass index is 28.96 kg/m.  Advanced Directives 01/10/2018 11/29/2017 01/01/2017 08/29/2016 03/01/2016 01/20/2016 01/02/2016  Does Patient Have a Medical Advance Directive? Yes Yes Yes No Yes Yes Yes  Type of Paramedic of [place];Living will [place];Living will [place];Living will - - [place];Living will -  Does patient want to make changes to medical advance directive? - No - Patient declined - - - - -  Copy of [place] in Chart? Yes Yes No - copy requested - - No - copy requested No - copy requested  Would patient like information on creating a medical advance directive? - - - - - - -    Tobacco Social History   Tobacco Use  Smoking Status Former Smoker  . Types: Cigarettes  . Last attempt to quit: 03/28/1999  . Years since quitting: 18.8  Smokeless Tobacco Never Used  Tobacco Comment   smoked 1967-2000, up to 3/4 ppd     Counseling given: Not Answered Comment: smoked  1967-2000, up to 3/4 ppd  Past Medical History:  Diagnosis Date  . Anxiety   . Atrophic vaginitis   . Breast cancer ([place])   . 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[place]) 05/20/2015  . Breast cyst    x3  . DDD (degenerative disc disease)   . Diverticulosis   . Fasting hyperglycemia   . GERD (gastroesophageal reflux disease)   . Gilbert syndrome   . Glaucoma   . History of hiatal hernia   . HSV infection   . Hyperlipidemia   . Radiation    left breast 50.4 gray  . STD (sexually transmitted disease)    HSV   Past Surgical History:  Procedure Laterality Date  . APPENDECTOMY  1961  . BREAST CYST ASPIRATION     x2 right ,x1 left  . COLONOSCOPY  2007   neg  . DILATION AND CURETTAGE OF UTERUS  1975  . FLEXIBLE SIGMOIDOSCOPY     x2  . PELVIC LAPAROSCOPY    . RADIOACTIVE SEED GUIDED PARTIAL MASTECTOMY WITH AXILLARY SENTINEL LYMPH NODE BIOPSY Left 05/31/2015   Procedure: LEFT BREAST RADIOACTIVE SEED GUIDED LUMPECTOMY WITH LEFT AXILLARY SENTINEL LYMPH NODE BIOPSY;  Surgeon: [name], MD;  Location: [place];  Service: General;  Laterality: Left;  . RE-EXCISION OF BREAST LUMPECTOMY Left 07/01/2015   Procedure: RE-EXCISION OF LEFT BREAST INFERIOR MARGIN;  Surgeon: [name],  MD;  Location: [place];  Service: General;  Laterality: Left;  . UPPER GASTROINTESTINAL ENDOSCOPY    . UPPER GI ENDOSCOPY  10/2010   Dr [name]  . WISDOM TOOTH EXTRACTION  1978   Family History  Problem Relation Age of Onset  . Lung cancer Mother        smoker  . Endometriosis Mother   . Cancer Mother        Bladder cancer  . Stomach cancer Maternal Grandmother   . Coronary artery disease Father   . Cancer Father        intestinal carcinoid tumors  . Sudden death Sister        79 week old, congenital deformity  . Liver cancer Maternal Uncle   . Stroke Paternal Grandfather 71  . Leukemia Maternal Uncle   . Breast cancer Paternal Aunt         great paternal aunt-Age 63's  . Colon cancer Paternal Aunt        lived to 60; pat great aunt  . Vascular Disease Paternal Grandmother        thoracic aneurysm  . Esophageal cancer Neg Hx   . Rectal cancer Neg Hx   . Pancreatic cancer Neg Hx    Social History   Socioeconomic History  . Marital status: Divorced    Spouse name: Not on file  . Number of children: 0  . Years of education: Not on file  . Highest education level: Not on file  Occupational History  . Occupation: Environmental health practitioner for [employer]  . Financial resource strain: Not hard at all  . Food insecurity:    Worry: Never true    Inability: Never true  . Transportation needs:    Medical: No    Non-medical: No  Tobacco Use  . Smoking status: Former Smoker    Types: Cigarettes    Last attempt to quit: 03/28/1999    Years since quitting: 18.8  . Smokeless tobacco: Never Used  . Tobacco comment: smoked 1967-2000, up to 3/4 ppd  Substance and Sexual Activity  . Alcohol use: Yes    Alcohol/week: 2.0 standard drinks    Types: 2 Cans of beer per week  . Drug use: No  . Sexual activity: Never    Birth control/protection: Post-menopausal    Comment: 1st intercourse 72 yo-More than 5 partners  Lifestyle  . Physical activity:    Days per week: 5 days    Minutes per session: 60 min  . Stress: Not at all  Relationships  . Social connections:    Talks on phone: More than three times a week    Gets together: More than three times a week    Attends religious service: More than 4 times per year    Active member of club or organization: Yes    Attends meetings of clubs or organizations: More than 4 times per year    Relationship status: Divorced  Other Topics Concern  . 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      Regular exercise-no    Outpatient Encounter Medications as of 01/10/2018  Medication Sig  . aspirin 81 MG tablet Take 81  mg by mouth as directed.   . bimatoprost (LUMIGAN) 0.01 % SOLN Place 1 drop into both eyes at bedtime.  . brinzolamide (AZOPT) 1 % ophthalmic suspension 1 drop 2 (two) times  daily.  . calcium carbonate (TUMS - DOSED IN MG ELEMENTAL CALCIUM) 500 MG chewable tablet Chew 1 tablet by mouth as needed for indigestion or heartburn.  . Cholecalciferol (VITAMIN D3 PO) Take 1 tablet by mouth daily.  [name] ibuprofen (ADVIL,MOTRIN) 200 MG tablet Take 200 mg by mouth every 6 (six) hours as needed.  [name] letrozole (FEMARA) 2.5 MG tablet Take 1 tablet (2.5 mg total) by mouth daily.  . ranitidine (ZANTAC) 150 MG tablet Take 1 tablet (150 mg total) by mouth 2 (two) times daily as needed.  . rosuvastatin (CRESTOR) 40 MG tablet TAKE 1 TABLET BY MOUTH ONCE DAILY  . [DISCONTINUED] Ergocalciferol (VITAMIN D2 PO) Take 5,000 Units by mouth daily.   . [DISCONTINUED] metoprolol tartrate (LOPRESSOR) 25 MG tablet Take 1 tablet (25 mg total) by mouth as directed. 1 pill every 8 hours as needed for tachycardia or palpitations.   No facility-administered encounter medications on file as of 01/10/2018.     Activities of Daily Living In your present state of health, do you have any difficulty performing the following activities: 01/10/2018  Hearing? N  Vision? N  Difficulty concentrating or making decisions? N  Walking or climbing stairs? N  Dressing or bathing? N  Doing errands, shopping? N  Preparing Food and eating ? N  Using the Toilet? N  In the past six months, have you accidently leaked urine? N  Do you have problems with loss of bowel control? N  Managing your Medications? N  Managing your Finances? N  Housekeeping or managing your Housekeeping? N  Some recent data might be hidden    Patient Care Team: [name], MD as PCP - General (Internal Medicine) Phineas Real, Belinda Block, MD as Consulting Physician (Gynecology) Armbruster, Carlota Raspberry, MD as Consulting Physician (Gastroenterology) Calvert Cantor, MD as Consulting  Physician (Ophthalmology)    Assessment:   This is a routine wellness examination for Lindsey Barrett. Physical assessment deferred to PCP.   Exercise Activities and Dietary recommendations Current Exercise Habits: Home exercise routine;Structured exercise class, Type of exercise: walking;strength training/weights;calisthenics, Time (Minutes): 50, Frequency (Times/Week): 3, Weekly Exercise (Minutes/Week): 150, Intensity: Mild, Exercise limited by: None identified Diet (meal preparation, eat out, water intake, caffeinated beverages, dairy products, fruits and vegetables): in general, a "healthy" diet  , well balanced   Reviewed heart healthy diet, Encouraged patient to increase daily water and healthy fluid intake. Discussed weight loss tips. Relevant patient education assigned to patient using Emmi.  Goals    . lose weight     Walk more at lunch, use my stationary bike, lift my home weights, go to the [facility] about once a week. Eat 2 pieces of chocolate instead of 4 .     [name] Patient Stated     Lose weight by monitor my diet closer, use portion control, continue to exercise.       Fall Risk Fall Risk  01/10/2018 01/01/2017 10/27/2015 07/13/2015 06/10/2014  Falls in the past year? No No No No No    Depression Screen PHQ 2/9 Scores 01/10/2018 01/01/2017 10/27/2015 07/13/2015  PHQ - 2 Score 0 0 0 0  PHQ- 9 Score 2 2 - -  Exception Documentation - - - -     Cognitive Function       Ad8 score reviewed for issues:  Issues making decisions: no  Less interest in hobbies / activities: no  Repeats questions, stories (family complaining): no  Trouble using ordinary gadgets (microwave, computer, phone):no  Forgets the month or  year: no  Mismanaging finances: no  Remembering appts: no  Daily problems with thinking and/or memory: no Ad8 score is= 0  Immunization History  Administered Date(s) Administered  . Hepatitis A, Adult 02/17/2016  . Influenza Split 04/18/2011  . Influenza, High Dose  Seasonal PF 05/26/2013, 01/27/2016, 02/26/2017  . Influenza, Seasonal, Injecte, Preservative Fre 04/17/2012  . Pneumococcal Conjugate-13 02/17/2016  . Pneumococcal Polysaccharide-23 07/02/2012  . Tdap 08/02/2010   Screening Tests Health Maintenance  Topic Date Due  . INFLUENZA VACCINE  12/05/2017  . MAMMOGRAM  03/06/2019  . TETANUS/TDAP  08/01/2020  . DEXA SCAN  12/26/2020  . COLONOSCOPY  02/12/2021  . Hepatitis C Screening  Completed  . PNA vac Low Risk Adult  Completed      Plan:    Continue doing brain stimulating activities (puzzles, reading, adult coloring books, staying active) to keep memory sharp.   Continue to eat heart healthy diet (full of fruits, vegetables, whole grains, lean protein, water--limit salt, fat, and sugar intake) and increase physical activity as tolerated.   I have personally reviewed and noted the following in the patient's chart:   . Medical and social history . Use of alcohol, tobacco or illicit drugs  . Current medications and supplements . Functional ability and status . Nutritional status . Physical activity . Advanced directives . List of other physicians . Vitals . Screenings to include cognitive, depression, and falls . Referrals and appointments  In addition, I have reviewed and discussed with patient certain preventive protocols, quality metrics, and best practice recommendations. A written personalized care plan for preventive services as well as general preventive health recommendations were provided to patient.     Michiel Cowboy, RN  01/10/2018   Medical screening examination/treatment/procedure(s) were performed by non-physician practitioner and as supervising physician I was immediately available for consultation/collaboration. I agree with above. [name], MD

## 2018-01-09 NOTE — Patient Instructions (Addendum)
  Test(s) ordered today. Your results will be released to MyChart (or called to you) after review, usually within 72hours after test completion. If any changes need to be made, you will be notified at that same time.   Medications reviewed and updated.  No changes recommended at this time.   Please followup in one year   

## 2018-01-09 NOTE — Progress Notes (Signed)
Subjective:    Patient ID: Lindsey Barrett, female    DOB: [birth year], 72 y.o.   MRN: [id number]  HPI The patient is here for follow up.  Prediabetes:  She is compliant with a low sugar/carbohydrate diet.  She does have a couple of pieces of candy a night and usually drinks one beer a night.  She is exercising regularly - walking and weights at the gym.  GERD:  She is taking her medication daily as prescribed.  She denies any GERD symptoms and feels her GERD is well controlled.   Hyperlipidemia: She is taking her medication daily. She is compliant with a low fat/cholesterol diet. She is exercising regularly. She denies myalgias.   Has occasional vagal episodes with lightheadedness and near syncope.    Medications and allergies reviewed with patient and updated if appropriate.  Patient Active Problem List   Diagnosis Date Noted  . Right rotator cuff tear 10/15/2017  . Axillary lymphadenopathy 02/26/2017  . Tinnitus of both ears 01/01/2017  . Reaction to internal stress 05/29/2016  . Prediabetes 08/10/2015  . 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[place]) 05/20/2015  . Open-angle glaucoma 06/17/2014  . Vitamin D deficiency 10/18/2011  . HSV infection   . Atrophic vaginitis   . VARICOSE VEINS, LOWER EXTREMITIES 09/28/2009  . GERD 07/14/2009  . Hyperlipidemia 12/26/2006  . GILBERT'S SYNDROME 08/09/2006  . DEGENERATIVE DISC DISEASE 08/09/2006    Current Outpatient Medications on File Prior to Visit  Medication Sig Dispense Refill  . aspirin 81 MG tablet Take 81 mg by mouth as directed.     . bimatoprost (LUMIGAN) 0.01 % SOLN Place 1 drop into both eyes at bedtime.    . brinzolamide (AZOPT) 1 % ophthalmic suspension 1 drop 2 (two) times daily.    . calcium carbonate (TUMS - DOSED IN MG ELEMENTAL CALCIUM) 500 MG chewable tablet Chew 1 tablet by mouth as needed for indigestion or heartburn.    [name] ibuprofen (ADVIL,MOTRIN) 200 MG tablet Take 200 mg by mouth  every 6 (six) hours as needed.    [name] letrozole (FEMARA) 2.5 MG tablet Take 1 tablet (2.5 mg total) by mouth daily. 90 tablet 3  . ranitidine (ZANTAC) 150 MG tablet Take 1 tablet (150 mg total) by mouth 2 (two) times daily as needed. 180 tablet 2  . rosuvastatin (CRESTOR) 40 MG tablet TAKE 1 TABLET BY MOUTH ONCE DAILY 90 tablet 2  . [DISCONTINUED] metoprolol tartrate (LOPRESSOR) 25 MG tablet Take 1 tablet (25 mg total) by mouth as directed. 1 pill every 8 hours as needed for tachycardia or palpitations. 30 tablet 2   No current facility-administered medications on file prior to visit.     Past Medical History:  Diagnosis Date  . Anxiety   . Atrophic vaginitis   . Breast cancer ([place])   . 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[place]) 05/20/2015  . Breast cyst    x3  . DDD (degenerative disc disease)   . Diverticulosis   . Fasting hyperglycemia   . GERD (gastroesophageal reflux disease)   . Gilbert syndrome   . Glaucoma   . History of hiatal hernia   . HSV infection   . Hyperlipidemia   . Radiation    left breast 50.4 gray  . STD (sexually transmitted disease)    HSV    Past Surgical History:  Procedure Laterality Date  . APPENDECTOMY  1961  . BREAST CYST ASPIRATION  x2 right ,x1 left  . COLONOSCOPY  2007   neg  . DILATION AND CURETTAGE OF UTERUS  1975  . FLEXIBLE SIGMOIDOSCOPY     x2  . PELVIC LAPAROSCOPY    . RADIOACTIVE SEED GUIDED PARTIAL MASTECTOMY WITH AXILLARY SENTINEL LYMPH NODE BIOPSY Left 05/31/2015   Procedure: LEFT BREAST RADIOACTIVE SEED GUIDED LUMPECTOMY WITH LEFT AXILLARY SENTINEL LYMPH NODE BIOPSY;  Surgeon: [name], MD;  Location: [facility];  Service: General;  Laterality: Left;  . RE-EXCISION OF BREAST LUMPECTOMY Left 07/01/2015   Procedure: RE-EXCISION OF LEFT BREAST INFERIOR MARGIN;  Surgeon: [name], MD;  Location: [facility];  Service: General;  Laterality: Left;  . UPPER  GASTROINTESTINAL ENDOSCOPY    . UPPER GI ENDOSCOPY  10/2010   Dr [name]  . WISDOM TOOTH EXTRACTION  1978    Social History   Socioeconomic History  . Marital status: Divorced    Spouse name: Not on file  . Number of children: 0  . Years of education: Not on file  . Highest education level: Not on file  Occupational History  . Occupation: Environmental health practitioner for [employer]  . Financial resource strain: Not hard at all  . Food insecurity:    Worry: Never true    Inability: Never true  . Transportation needs:    Medical: No    Non-medical: No  Tobacco Use  . Smoking status: Former Smoker    Types: Cigarettes    Last attempt to quit: 03/28/1999    Years since quitting: 18.8  . Smokeless tobacco: Never Used  . Tobacco comment: smoked 1967-2000, up to 3/4 ppd  Substance and Sexual Activity  . Alcohol use: Yes    Alcohol/week: 2.0 standard drinks    Types: 2 Cans of beer per week  . Drug use: No  . Sexual activity: Never    Birth control/protection: Post-menopausal    Comment: 1st intercourse 72 yo-More than 5 partners  Lifestyle  . Physical activity:    Days per week: 6 days    Minutes per session: 60 min  . Stress: Not at all  Relationships  . Social connections:    Talks on phone: More than three times a week    Gets together: More than three times a week    Attends religious service: More than 4 times per year    Active member of club or organization: Yes    Attends meetings of clubs or organizations: More than 4 times per year    Relationship status: Divorced  Other Topics Concern  . 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      Regular exercise-no    Family History  Problem Relation Age of Onset  . Lung cancer Mother        smoker  . Endometriosis Mother   . Cancer Mother        Bladder cancer  . Stomach cancer Maternal Grandmother   . Coronary artery disease Father   . Cancer Father         intestinal carcinoid tumors  . Sudden death Sister        39 week old, congenital deformity  . Liver cancer Maternal Uncle   . Stroke Paternal Grandfather 38  . Leukemia Maternal Uncle   . Breast cancer Paternal Aunt        great paternal aunt-Age 18's  .  Colon cancer Paternal Aunt        lived to 94; pat great aunt  . Vascular Disease Paternal Grandmother        thoracic aneurysm  . Esophageal cancer Neg Hx   . Rectal cancer Neg Hx   . Pancreatic cancer Neg Hx     Review of Systems  Constitutional: Negative for chills and fever.  Respiratory: Negative for cough, shortness of breath and wheezing.   Cardiovascular: Negative for chest pain, palpitations and leg swelling.  Musculoskeletal: Negative for myalgias.  Neurological: Positive for light-headedness (occ with sinus, ear related, vagus nerve related). Negative for headaches.       Objective:   Vitals:   01/10/18 1435  BP: 126/64  Pulse: 72  Temp: 98.4 F (36.9 C)  SpO2: 98%   BP Readings from Last 3 Encounters:  01/10/18 126/64  01/10/18 126/64  12/12/17 120/70   Wt Readings from Last 3 Encounters:  01/10/18 174 lb (78.9 kg)  01/10/18 174 lb (78.9 kg)  12/12/17 173 lb (78.5 kg)   Body mass index is 28.96 kg/m.   Physical Exam    Constitutional: Appears well-developed and well-nourished. No distress.  HENT:  Head: Normocephalic and atraumatic.  Neck: Neck supple. No tracheal deviation present. No thyromegaly present.  No cervical lymphadenopathy Cardiovascular: Normal rate, regular rhythm and normal heart sounds.   No murmur heard. No carotid bruit .  No edema Pulmonary/Chest: Effort normal and breath sounds normal. No respiratory distress. No has no wheezes. No rales.  Abd: soft, NT, ND Skin: Skin is warm and dry. Not diaphoretic.  Psychiatric: Normal mood and affect. Behavior is normal.      Assessment & Plan:    See Problem List for Assessment and Plan of chronic medical problems.

## 2018-01-10 ENCOUNTER — Ambulatory Visit (INDEPENDENT_AMBULATORY_CARE_PROVIDER_SITE_OTHER): Payer: Medicare Other | Admitting: Internal Medicine

## 2018-01-10 ENCOUNTER — Ambulatory Visit (INDEPENDENT_AMBULATORY_CARE_PROVIDER_SITE_OTHER): Payer: Medicare Other | Admitting: *Deleted

## 2018-01-10 ENCOUNTER — Encounter: Payer: Self-pay | Admitting: Internal Medicine

## 2018-01-10 ENCOUNTER — Ambulatory Visit: Payer: Medicare Other | Admitting: Physical Therapy

## 2018-01-10 VITALS — BP 126/64 | HR 72 | Temp 98.4°F | Ht 65.0 in | Wt 174.0 lb

## 2018-01-10 VITALS — BP 126/64 | HR 72 | Temp 98.4°F | Resp 17 | Ht 65.0 in | Wt 174.0 lb

## 2018-01-10 DIAGNOSIS — K219 Gastro-esophageal reflux disease without esophagitis: Secondary | ICD-10-CM | POA: Diagnosis not present

## 2018-01-10 DIAGNOSIS — M25611 Stiffness of right shoulder, not elsewhere classified: Secondary | ICD-10-CM

## 2018-01-10 DIAGNOSIS — M25511 Pain in right shoulder: Secondary | ICD-10-CM

## 2018-01-10 DIAGNOSIS — E782 Mixed hyperlipidemia: Secondary | ICD-10-CM

## 2018-01-10 DIAGNOSIS — R7303 Prediabetes: Secondary | ICD-10-CM | POA: Diagnosis not present

## 2018-01-10 DIAGNOSIS — Z0000 Encounter for general adult medical examination without abnormal findings: Secondary | ICD-10-CM

## 2018-01-10 DIAGNOSIS — R2232 Localized swelling, mass and lump, left upper limb: Secondary | ICD-10-CM | POA: Diagnosis not present

## 2018-01-10 NOTE — Therapy (Signed)
[facility] [address] [place], [place], [address] Phone: [phone]   Fax:  [phone]  Physical Therapy Treatment  Patient Details  Name: Lindsey Barrett MRN: [id number] Date of Birth: [birth year] Referring Provider: Hulan Saas   Encounter Date: 01/10/2018  PT End of Session - 01/10/18 1043    Visit Number  11    Date for PT Re-Evaluation  02/09/18    PT Start Time  1219    PT Stop Time  1105    PT Time Calculation (min)  50 min       Past Medical History:  Diagnosis Date  . Anxiety   . Atrophic vaginitis   . Breast cancer ([place])   . 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[place]) 05/20/2015  . Breast cyst    x3  . DDD (degenerative disc disease)   . Diverticulosis   . Fasting hyperglycemia   . GERD (gastroesophageal reflux disease)   . Gilbert syndrome   . Glaucoma   . History of hiatal hernia   . HSV infection   . Hyperlipidemia   . Radiation    left breast 50.4 gray  . STD (sexually transmitted disease)    HSV    Past Surgical History:  Procedure Laterality Date  . APPENDECTOMY  1961  . BREAST CYST ASPIRATION     x2 right ,x1 left  . COLONOSCOPY  2007   neg  . DILATION AND CURETTAGE OF UTERUS  1975  . FLEXIBLE SIGMOIDOSCOPY     x2  . PELVIC LAPAROSCOPY    . RADIOACTIVE SEED GUIDED PARTIAL MASTECTOMY WITH AXILLARY SENTINEL LYMPH NODE BIOPSY Left 05/31/2015   Procedure: LEFT BREAST RADIOACTIVE SEED GUIDED LUMPECTOMY WITH LEFT AXILLARY SENTINEL LYMPH NODE BIOPSY;  Surgeon: [name], MD;  Location: [facility];  Service: General;  Laterality: Left;  . RE-EXCISION OF BREAST LUMPECTOMY Left 07/01/2015   Procedure: RE-EXCISION OF LEFT BREAST INFERIOR MARGIN;  Surgeon: [name], MD;  Location: [facility];  Service: General;  Laterality: Left;  . UPPER GASTROINTESTINAL ENDOSCOPY    . UPPER GI ENDOSCOPY  10/2010   Dr [name]  .  WISDOM TOOTH EXTRACTION  1978    There were no vitals filed for this visit.  Subjective Assessment - 01/10/18 1019    Subjective  overall  50% better. muscle soreness after last session but no pain.    Currently in Pain?  Yes    Pain Score  1     Pain Location  Shoulder                       [facility] Adult PT Treatment/Exercise - 01/10/18 0001      Neck Exercises: Machines for Strengthening   UBE (Upper Arm Bike)  3 fwd/3 back L 4    Cybex Row  25# 2 SETS 15    Lat Pull  25# 2 sets 15       Shoulder Exercises: Supine   Horizontal ABduction  --    Other Supine Exercises  IR/ER varied ex and wts      Shoulder Exercises: Standing   External Rotation  Strengthening;Right;20 reps;Weights   elbow on ball   Internal Rotation  Strengthening;Both;20 reps;Weights    Internal Rotation Weight (lbs)  3    Other Standing Exercises  green tband horz abd ,chest press and fly   15 times each   Other Standing  Exercises  PNF 2 sets 10 green      Cryotherapy   Number Minutes Cryotherapy  15 Minutes    Cryotherapy Location  Shoulder    Type of Cryotherapy  Ice pack      Electrical Stimulation   Electrical Stimulation Location  rt shld    Electrical Stimulation Action  IFC    Electrical Stimulation Goals  Pain      Manual Therapy   Manual Therapy  Joint mobilization;Passive ROM    Joint Mobilization  joint capsule stretch    Passive ROM  PROM with some contract relax to stretch capsule at end range, horizontal adduction               PT Short Term Goals - 12/10/17 1047      PT SHORT TERM GOAL #1   Title  I with HEP    Status  Achieved        PT Long Term Goals - 01/07/18 1704      PT LONG TERM GOAL #1   Title  Patient able to don/doff shirt without bending over.    Status  Partially Met      PT LONG TERM GOAL #2   Title  Patient able to perform ADLS without limitation in ROM    Status  Partially Met      PT LONG TERM GOAL #3   Title  Patient able to  perform ADLs with pain of 2/10 or less in R shoulder.    Status  Partially Met            Plan - 01/10/18 1043    Clinical Impression Statement  focus on IR/ER with PTA assist to stab jt esp with IR as humeral head wants to come anterior- jt capsule stretch with CR to increase end range ROM    PT Treatment/Interventions  ADLs/Self Care Home Management;Electrical Stimulation;Cryotherapy;Moist Heat;Ultrasound;Patient/family education;Neuromuscular re-education;Therapeutic exercise;Manual techniques;Dry needling;Passive range of motion    PT Next Visit Plan  would like to advance her to a safe return to the gym       Patient will benefit from skilled therapeutic intervention in order to improve the following deficits and impairments:  Pain, Decreased strength, Decreased range of motion  Visit Diagnosis: Acute pain of right shoulder  Stiffness of right shoulder, not elsewhere classified     Problem List Patient Active Problem List   Diagnosis Date Noted  . Right rotator cuff tear 10/15/2017  . Acute pain of right shoulder 09/16/2017  . Axillary lymphadenopathy 02/26/2017  . Scalp lesion 02/26/2017  . Tinnitus of both ears 01/01/2017  . Change in bowel habits 10/26/2016  . Reaction to internal stress 05/29/2016  . Prediabetes 08/10/2015  . 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[place]) 05/20/2015  . Open-angle glaucoma 06/17/2014  . Vitamin D deficiency 10/18/2011  . HSV infection   . Atrophic vaginitis   . VARICOSE VEINS, LOWER EXTREMITIES 09/28/2009  . HEADACHE 09/28/2009  . GERD 07/14/2009  . NONSPECIFIC ABNORMAL ELECTROCARDIOGRAM 07/14/2009  . Hyperlipidemia 12/26/2006  . GILBERT'S SYNDROME 08/09/2006  . DEGENERATIVE DISC DISEASE 08/09/2006    Harshini Trent,ANGIE PTA 01/10/2018, 10:50 AM  [address] [place], [place], [address] Phone: [phone]   Fax:  [phone]  Name:  Lindsey Barrett MRN: [id number] Date of Birth: August 25, 1945

## 2018-01-10 NOTE — Assessment & Plan Note (Signed)
Check a1c Low sugar / carb diet Stressed regular exercise, weight loss  

## 2018-01-10 NOTE — Patient Instructions (Addendum)
Continue doing brain stimulating activities (puzzles, reading, adult coloring books, staying active) to keep memory sharp.   Continue to eat heart healthy diet (full of fruits, vegetables, whole grains, lean protein, water--limit salt, fat, and sugar intake) and increase physical activity as tolerated.   Lindsey Barrett , Thank you for taking time to come for your Medicare Wellness Visit. I appreciate your ongoing commitment to your health goals. Please review the following plan we discussed and let me know if I can assist you in the future.   These are the goals we discussed: Goals    . lose weight     Walk more at lunch, use my stationary bike, lift my home weights, go to the [facility] about once a week. Eat 2 pieces of chocolate instead of 4 .     [name] Patient Stated     Lose weight by monitor my diet closer, use portion control, continue to exercise.       This is a list of the screening recommended for you and due dates:  Health Maintenance  Topic Date Due  . Flu Shot  12/05/2017  . Mammogram  03/06/2019  . Tetanus Vaccine  08/01/2020  . DEXA scan (bone density measurement)  12/26/2020  . Colon Cancer Screening  02/12/2021  .  Hepatitis C: One time screening is recommended by Center for Disease Control  (CDC) for  adults born from [birth year] through 1963.   Completed  . Pneumonia vaccines  Completed    Health Maintenance, Female Adopting a healthy lifestyle and getting preventive care can go a long way to promote health and wellness. Talk with your health care provider about what schedule of regular examinations is right for you. This is a good chance for you to check in with your provider about disease prevention and staying healthy. In between checkups, there are plenty of things you can do on your own. Experts have done a lot of research about which lifestyle changes and preventive measures are most likely to keep you healthy. Ask your health care provider for more information. Weight and  diet Eat a healthy diet  Be sure to include plenty of vegetables, fruits, low-fat dairy products, and lean protein.  Do not eat a lot of foods high in solid fats, added sugars, or salt.  Get regular exercise. This is one of the most important things you can do for your health. ? Most adults should exercise for at least 150 minutes each week. The exercise should increase your heart rate and make you sweat (moderate-intensity exercise). ? Most adults should also do strengthening exercises at least twice a week. This is in addition to the moderate-intensity exercise.  Maintain a healthy weight  Body mass index (BMI) is a measurement that can be used to identify possible weight problems. It estimates body fat based on height and weight. Your health care provider can help determine your BMI and help you achieve or maintain a healthy weight.  For females 30 years of age and older: ? A BMI below 18.5 is considered underweight. ? A BMI of 18.5 to 24.9 is normal. ? A BMI of 25 to 29.9 is considered overweight. ? A BMI of 30 and above is considered obese.  Watch levels of cholesterol and blood lipids  You should start having your blood tested for lipids and cholesterol at 70 years of age, then have this test every 5 years.  You may need to have your cholesterol levels checked more often if: ?  Your lipid or cholesterol levels are high. ? You are older than 70 years of age. ? You are at high risk for heart disease.  Cancer screening Lung Cancer  Lung cancer screening is recommended for adults 20-90 years old who are at high risk for lung cancer because of a history of smoking.  A yearly low-dose CT scan of the lungs is recommended for people who: ? Currently smoke. ? Have quit within the past 15 years. ? Have at least a 30-pack-year history of smoking. A pack year is smoking an average of one pack of cigarettes a day for 1 year.  Yearly screening should continue until it has been 15 years  since you quit.  Yearly screening should stop if you develop a health problem that would prevent you from having lung cancer treatment.  Breast Cancer  Practice breast self-awareness. This means understanding how your breasts normally appear and feel.  It also means doing regular breast self-exams. Let your health care provider know about any changes, no matter how small.  If you are in your 20s or 30s, you should have a clinical breast exam (CBE) by a health care provider every 1-3 years as part of a regular health exam.  If you are 68 or older, have a CBE every year. Also consider having a breast X-ray (mammogram) every year.  If you have a family history of breast cancer, talk to your health care provider about genetic screening.  If you are at high risk for breast cancer, talk to your health care provider about having an MRI and a mammogram every year.  Breast cancer gene (BRCA) assessment is recommended for women who have family members with BRCA-related cancers. BRCA-related cancers include: ? Breast. ? Ovarian. ? Tubal. ? Peritoneal cancers.  Results of the assessment will determine the need for genetic counseling and BRCA1 and BRCA2 testing.  Cervical Cancer Your health care provider may recommend that you be screened regularly for cancer of the pelvic organs (ovaries, uterus, and vagina). This screening involves a pelvic examination, including checking for microscopic changes to the surface of your cervix (Pap test). You may be encouraged to have this screening done every 3 years, beginning at age 35.  For women ages 56-65, health care providers may recommend pelvic exams and Pap testing every 3 years, or they may recommend the Pap and pelvic exam, combined with testing for human papilloma virus (HPV), every 5 years. Some types of HPV increase your risk of cervical cancer. Testing for HPV may also be done on women of any age with unclear Pap test results.  Other health care  providers may not recommend any screening for nonpregnant women who are considered low risk for pelvic cancer and who do not have symptoms. Ask your health care provider if a screening pelvic exam is right for you.  If you have had past treatment for cervical cancer or a condition that could lead to cancer, you need Pap tests and screening for cancer for at least 20 years after your treatment. If Pap tests have been discontinued, your risk factors (such as having a new sexual partner) need to be reassessed to determine if screening should resume. Some women have medical problems that increase the chance of getting cervical cancer. In these cases, your health care provider may recommend more frequent screening and Pap tests.  Colorectal Cancer  This type of cancer can be detected and often prevented.  Routine colorectal cancer screening usually begins at 70 years  of age and continues through 70 years of age.  Your health care provider may recommend screening at an earlier age if you have risk factors for colon cancer.  Your health care provider may also recommend using home test kits to check for hidden blood in the stool.  A small camera at the end of a tube can be used to examine your colon directly (sigmoidoscopy or colonoscopy). This is done to check for the earliest forms of colorectal cancer.  Routine screening usually begins at age 35.  Direct examination of the colon should be repeated every 5-10 years through 70 years of age. However, you may need to be screened more often if early forms of precancerous polyps or small growths are found.  Skin Cancer  Check your skin from head to toe regularly.  Tell your health care provider about any new moles or changes in moles, especially if there is a change in a mole's shape or color.  Also tell your health care provider if you have a mole that is larger than the size of a pencil eraser.  Always use sunscreen. Apply sunscreen liberally and  repeatedly throughout the day.  Protect yourself by wearing long sleeves, pants, a wide-brimmed hat, and sunglasses whenever you are outside.  Heart disease, diabetes, and high blood pressure  High blood pressure causes heart disease and increases the risk of stroke. High blood pressure is more likely to develop in: ? People who have blood pressure in the high end of the normal range (130-139/85-89 mm Hg). ? People who are overweight or obese. ? People who are African American.  If you are 28-74 years of age, have your blood pressure checked every 3-5 years. If you are 44 years of age or older, have your blood pressure checked every year. You should have your blood pressure measured twice-once when you are at a hospital or clinic, and once when you are not at a hospital or clinic. Record the average of the two measurements. To check your blood pressure when you are not at a hospital or clinic, you can use: ? An automated blood pressure machine at a pharmacy. ? A home blood pressure monitor.  If you are between 26 years and 67 years old, ask your health care provider if you should take aspirin to prevent strokes.  Have regular diabetes screenings. This involves taking a blood sample to check your fasting blood sugar level. ? If you are at a normal weight and have a low risk for diabetes, have this test once every three years after 70 years of age. ? If you are overweight and have a high risk for diabetes, consider being tested at a younger age or more often. Preventing infection Hepatitis B  If you have a higher risk for hepatitis B, you should be screened for this virus. You are considered at high risk for hepatitis B if: ? You were born in a country where hepatitis B is common. Ask your health care provider which countries are considered high risk. ? Your parents were born in a high-risk country, and you have not been immunized against hepatitis B (hepatitis B vaccine). ? You have HIV or  AIDS. ? You use needles to inject street drugs. ? You live with someone who has hepatitis B. ? You have had sex with someone who has hepatitis B. ? You get hemodialysis treatment. ? You take certain medicines for conditions, including cancer, organ transplantation, and autoimmune conditions.  Hepatitis C  Blood  testing is recommended for: ? Everyone born from [birth year] through 1963. ? Anyone with known risk factors for hepatitis C.  Sexually transmitted infections (STIs)  You should be screened for sexually transmitted infections (STIs) including gonorrhea and chlamydia if: ? You are sexually active and are younger than 70 years of age. ? You are older than 70 years of age and your health care provider tells you that you are at risk for this type of infection. ? Your sexual activity has changed since you were last screened and you are at an increased risk for chlamydia or gonorrhea. Ask your health care provider if you are at risk.  If you do not have HIV, but are at risk, it may be recommended that you take a prescription medicine daily to prevent HIV infection. This is called pre-exposure prophylaxis (PrEP). You are considered at risk if: ? You are sexually active and do not regularly use condoms or know the HIV status of your partner(s). ? You take drugs by injection. ? You are sexually active with a partner who has HIV.  Talk with your health care provider about whether you are at high risk of being infected with HIV. If you choose to begin PrEP, you should first be tested for HIV. You should then be tested every 3 months for as long as you are taking PrEP. Pregnancy  If you are premenopausal and you may become pregnant, ask your health care provider about preconception counseling.  If you may become pregnant, take 400 to 800 micrograms (mcg) of folic acid every day.  If you want to prevent pregnancy, talk to your health care provider about birth control (contraception). Osteoporosis  and menopause  Osteoporosis is a disease in which the bones lose minerals and strength with aging. This can result in serious bone fractures. Your risk for osteoporosis can be identified using a bone density scan.  If you are 52 years of age or older, or if you are at risk for osteoporosis and fractures, ask your health care provider if you should be screened.  Ask your health care provider whether you should take a calcium or vitamin D supplement to lower your risk for osteoporosis.  Menopause may have certain physical symptoms and risks.  Hormone replacement therapy may reduce some of these symptoms and risks. Talk to your health care provider about whether hormone replacement therapy is right for you. Follow these instructions at home:  Schedule regular health, dental, and eye exams.  Stay current with your immunizations.  Do not use any tobacco products including cigarettes, chewing tobacco, or electronic cigarettes.  If you are pregnant, do not drink alcohol.  If you are breastfeeding, limit how much and how often you drink alcohol.  Limit alcohol intake to no more than 1 drink per day for nonpregnant women. One drink equals 12 ounces of beer, 5 ounces of wine, or 1 ounces of hard liquor.  Do not use street drugs.  Do not share needles.  Ask your health care provider for help if you need support or information about quitting drugs.  Tell your health care provider if you often feel depressed.  Tell your health care provider if you have ever been abused or do not feel safe at home. This information is not intended to replace advice given to you by your health care provider. Make sure you discuss any questions you have with your health care provider. Document Released: 11/06/2010 Document Revised: 09/29/2015 Document Reviewed: 01/25/2015 Elsevier Interactive Patient Education  2018 [place].

## 2018-01-10 NOTE — Assessment & Plan Note (Signed)
GERD controlled Continue daily medication  

## 2018-01-10 NOTE — Assessment & Plan Note (Signed)
Check lipid panel  Continue daily statin Regular exercise and healthy diet encouraged  

## 2018-01-12 ENCOUNTER — Other Ambulatory Visit: Payer: Self-pay | Admitting: Internal Medicine

## 2018-01-13 ENCOUNTER — Other Ambulatory Visit (INDEPENDENT_AMBULATORY_CARE_PROVIDER_SITE_OTHER): Payer: Medicare Other

## 2018-01-13 DIAGNOSIS — R7303 Prediabetes: Secondary | ICD-10-CM

## 2018-01-13 DIAGNOSIS — E782 Mixed hyperlipidemia: Secondary | ICD-10-CM

## 2018-01-13 DIAGNOSIS — K219 Gastro-esophageal reflux disease without esophagitis: Secondary | ICD-10-CM

## 2018-01-13 LAB — COMPREHENSIVE METABOLIC PANEL
ALBUMIN: 4.3 g/dL (ref 3.5–5.2)
ALK PHOS: 58 U/L (ref 39–117)
ALT: 20 U/L (ref 0–35)
AST: 21 U/L (ref 0–37)
BUN: 12 mg/dL (ref 6–23)
CO2: 28 mEq/L (ref 19–32)
CREATININE: 0.69 mg/dL (ref 0.40–1.20)
Calcium: 9.4 mg/dL (ref 8.4–10.5)
Chloride: 105 mEq/L (ref 96–112)
GFR: 89.26 mL/min (ref >60.00)
Glucose, Bld: 105 mg/dL — ABNORMAL HIGH (ref 70–99)
Potassium: 4.1 mEq/L (ref 3.5–5.1)
SODIUM: 141 meq/L (ref 135–145)
TOTAL PROTEIN: 7 g/dL (ref 6.0–8.3)
Total Bilirubin: 1.6 mg/dL — ABNORMAL HIGH (ref 0.2–1.2)

## 2018-01-13 LAB — LIPID PANEL
CHOLESTEROL: 197 mg/dL (ref 0–200)
HDL: 48.9 mg/dL (ref >39.00)
LDL Cholesterol: 122 mg/dL — ABNORMAL HIGH (ref 0–99)
NonHDL: 147.96
Total CHOL/HDL Ratio: 4
Triglycerides: 130 mg/dL (ref 0.0–149.0)
VLDL: 26 mg/dL (ref 0.0–40.0)

## 2018-01-13 LAB — CBC WITH DIFFERENTIAL/PLATELET
Basophils Absolute: 0.1 10*3/uL (ref 0.0–0.1)
Basophils Relative: 0.9 % (ref 0.0–3.0)
EOS ABS: 0.2 10*3/uL (ref 0.0–0.7)
Eosinophils Relative: 3.6 % (ref 0.0–5.0)
HCT: 41.8 % (ref 36.0–46.0)
HEMOGLOBIN: 13.9 g/dL (ref 12.0–15.0)
LYMPHS ABS: 2.1 10*3/uL (ref 0.7–4.0)
Lymphocytes Relative: 34.6 % (ref 12.0–46.0)
MCHC: 33.3 g/dL (ref 30.0–36.0)
MCV: 85.3 fl (ref 78.0–100.0)
MONO ABS: 0.6 10*3/uL (ref 0.1–1.0)
Monocytes Relative: 10.1 % (ref 3.0–12.0)
NEUTROS PCT: 50.8 % (ref 43.0–77.0)
Neutro Abs: 3.1 10*3/uL (ref 1.4–7.7)
Platelets: 246 10*3/uL (ref 150.0–400.0)
RBC: 4.9 Mil/uL (ref 3.87–5.11)
RDW: 14 % (ref 11.5–15.5)
WBC: 6.2 10*3/uL (ref 4.0–10.5)

## 2018-01-13 LAB — TSH: TSH: 2.53 u[IU]/mL (ref 0.35–4.50)

## 2018-01-13 LAB — HEMOGLOBIN A1C: HEMOGLOBIN A1C: 6.2 % (ref 4.6–6.5)

## 2018-01-14 ENCOUNTER — Ambulatory Visit: Payer: Medicare Other | Admitting: Physical Therapy

## 2018-01-14 ENCOUNTER — Encounter: Payer: Self-pay | Admitting: Physical Therapy

## 2018-01-14 DIAGNOSIS — R2232 Localized swelling, mass and lump, left upper limb: Secondary | ICD-10-CM | POA: Diagnosis not present

## 2018-01-14 DIAGNOSIS — M25511 Pain in right shoulder: Secondary | ICD-10-CM

## 2018-01-14 DIAGNOSIS — M25611 Stiffness of right shoulder, not elsewhere classified: Secondary | ICD-10-CM | POA: Diagnosis not present

## 2018-01-14 NOTE — Therapy (Signed)
[place] [address] [place], [place], [address] Phone: [phone]   Fax:  [phone]  Physical Therapy Treatment  Patient Details  Name: Lindsey Barrett MRN: [id number] Date of Birth: 27-Oct-1945 Referring Provider: Hulan Saas   Encounter Date: 01/14/2018  PT End of Session - 01/14/18 1225    Visit Number  12    Date for PT Re-Evaluation  02/09/18    PT Start Time  2725    PT Stop Time  1240    PT Time Calculation (min)  55 min       Past Medical History:  Diagnosis Date  . Anxiety   . Atrophic vaginitis   . Breast cancer ([place])   . 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[place]) 05/20/2015  . Breast cyst    x3  . DDD (degenerative disc disease)   . Diverticulosis   . Fasting hyperglycemia   . GERD (gastroesophageal reflux disease)   . Gilbert syndrome   . Glaucoma   . History of hiatal hernia   . HSV infection   . Hyperlipidemia   . Radiation    left breast 50.4 gray  . STD (sexually transmitted disease)    HSV    Past Surgical History:  Procedure Laterality Date  . APPENDECTOMY  1961  . BREAST CYST ASPIRATION     x2 right ,x1 left  . COLONOSCOPY  2007   neg  . DILATION AND CURETTAGE OF UTERUS  1975  . FLEXIBLE SIGMOIDOSCOPY     x2  . PELVIC LAPAROSCOPY    . RADIOACTIVE SEED GUIDED PARTIAL MASTECTOMY WITH AXILLARY SENTINEL LYMPH NODE BIOPSY Left 05/31/2015   Procedure: LEFT BREAST RADIOACTIVE SEED GUIDED LUMPECTOMY WITH LEFT AXILLARY SENTINEL LYMPH NODE BIOPSY;  Surgeon: [name], MD;  Location: [facility];  Service: General;  Laterality: Left;  . RE-EXCISION OF BREAST LUMPECTOMY Left 07/01/2015   Procedure: RE-EXCISION OF LEFT BREAST INFERIOR MARGIN;  Surgeon: [name], MD;  Location: [facility];  Service: General;  Laterality: Left;  . UPPER GASTROINTESTINAL ENDOSCOPY    . UPPER GI ENDOSCOPY  10/2010   Dr [name]  .  WISDOM TOOTH EXTRACTION  1978    There were no vitals filed for this visit.  Subjective Assessment - 01/14/18 1147    Subjective  sore since last session but have been so busy I have not done exercises at home    Currently in Pain?  Yes    Pain Score  3     Pain Location  Shoulder    Pain Orientation  Right;Lateral                       [facility] Adult PT Treatment/Exercise - 01/14/18 0001      Exercises   Exercises  Shoulder;Neck      Neck Exercises: Machines for Strengthening   UBE (Upper Arm Bike)  3 fwd/3 back L 4    Cybex Row  25# 2 SETS 15    Lat Pull  25# 2 sets 15       Shoulder Exercises: Standing   Other Standing Exercises  green tband PNF 2 sets 10    Other Standing Exercises  red tband 3 pt stab 15 times each      Shoulder Exercises: Pulleys   Other Pulley Exercises  10# shld ext,retraction, abd and chest press    Other Pulley Exercises  5#  IR/ER with pulleys    assistance with ER     Cryotherapy   Number Minutes Cryotherapy  15 Minutes    Cryotherapy Location  Shoulder    Type of Cryotherapy  Ice pack      Electrical Stimulation   Electrical Stimulation Location  rt shld    Electrical Stimulation Action  IFC    Electrical Stimulation Goals  Pain      Manual Therapy   Manual Therapy  Joint mobilization;Passive ROM    Joint Mobilization  joint capsule stretch    Passive ROM  PROM with some contract relax to stretch capsule at end range, horizontal adduction   distraction with flex an dad at end range              PT Short Term Goals - 12/10/17 1047      PT SHORT TERM GOAL #1   Title  I with HEP    Status  Achieved        PT Long Term Goals - 01/07/18 1704      PT LONG TERM GOAL #1   Title  Patient able to don/doff shirt without bending over.    Status  Partially Met      PT LONG TERM GOAL #2   Title  Patient able to perform ADLS without limitation in ROM    Status  Partially Met      PT LONG TERM GOAL #3   Title   Patient able to perform ADLs with pain of 2/10 or less in R shoulder.    Status  Partially Met            Plan - 01/14/18 1226    Clinical Impression Statement  pt doing well, ER weakness and pain with PROM flex and abd at end range but with distaction no pain and full ROM.    PT Treatment/Interventions  ADLs/Self Care Home Management;Electrical Stimulation;Cryotherapy;Moist Heat;Ultrasound;Patient/family education;Neuromuscular re-education;Therapeutic exercise;Manual techniques;Dry needling;Passive range of motion    PT Next Visit Plan  would like to advance her to a safe return to the gym       Patient will benefit from skilled therapeutic intervention in order to improve the following deficits and impairments:  Pain, Decreased strength, Decreased range of motion  Visit Diagnosis: Acute pain of right shoulder  Stiffness of right shoulder, not elsewhere classified     Problem List Patient Active Problem List   Diagnosis Date Noted  . Right rotator cuff tear 10/15/2017  . Axillary lymphadenopathy 02/26/2017  . Tinnitus of both ears 01/01/2017  . Reaction to internal stress 05/29/2016  . Prediabetes 08/10/2015  . 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[facility]) 05/20/2015  . Open-angle glaucoma 06/17/2014  . Vitamin D deficiency 10/18/2011  . HSV infection   . Atrophic vaginitis   . VARICOSE VEINS, LOWER EXTREMITIES 09/28/2009  . GERD 07/14/2009  . Hyperlipidemia 12/26/2006  . GILBERT'S SYNDROME 08/09/2006  . DEGENERATIVE DISC DISEASE 08/09/2006    Shiheem Corporan,ANGIE PTA 01/14/2018, 12:27 PM  [place] [address] [place], [place], [address] Phone: [phone]   Fax:  [phone]  Name: Lindsey Barrett MRN: [id number] Date of Birth: [birth year]

## 2018-01-16 ENCOUNTER — Encounter: Payer: Self-pay | Admitting: Physical Therapy

## 2018-01-16 ENCOUNTER — Ambulatory Visit: Payer: Medicare Other | Admitting: Physical Therapy

## 2018-01-16 ENCOUNTER — Encounter: Payer: Self-pay | Admitting: Internal Medicine

## 2018-01-16 DIAGNOSIS — M25611 Stiffness of right shoulder, not elsewhere classified: Secondary | ICD-10-CM | POA: Diagnosis not present

## 2018-01-16 DIAGNOSIS — R2232 Localized swelling, mass and lump, left upper limb: Secondary | ICD-10-CM

## 2018-01-16 DIAGNOSIS — M25511 Pain in right shoulder: Secondary | ICD-10-CM | POA: Diagnosis not present

## 2018-01-16 NOTE — Therapy (Signed)
[place], [place], [address] Phone: [phone]   Fax:  [phone]  Physical Therapy Treatment  Patient Details  Name: Lindsey Barrett MRN: [id number] Date of Birth: [birth year] Referring Provider: Hulan Saas   Encounter Date: 01/16/2018  PT End of Session - 01/16/18 1054    Visit Number  13    Date for PT Re-Evaluation  02/09/18    PT Start Time  7628    PT Stop Time  1108    PT Time Calculation (min)  53 min       Past Medical History:  Diagnosis Date  . Anxiety   . Atrophic vaginitis   . Breast cancer ([place])   . 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[place]) 05/20/2015  . Breast cyst    x3  . DDD (degenerative disc disease)   . Diverticulosis   . Fasting hyperglycemia   . GERD (gastroesophageal reflux disease)   . Gilbert syndrome   . Glaucoma   . History of hiatal hernia   . HSV infection   . Hyperlipidemia   . Radiation    left breast 50.4 gray  . STD (sexually transmitted disease)    HSV    Past Surgical History:  Procedure Laterality Date  . APPENDECTOMY  1961  . BREAST CYST ASPIRATION     x2 right ,x1 left  . COLONOSCOPY  2007   neg  . DILATION AND CURETTAGE OF UTERUS  1975  . FLEXIBLE SIGMOIDOSCOPY     x2  . PELVIC LAPAROSCOPY    . RADIOACTIVE SEED GUIDED PARTIAL MASTECTOMY WITH AXILLARY SENTINEL LYMPH NODE BIOPSY Left 05/31/2015   Procedure: LEFT BREAST RADIOACTIVE SEED GUIDED LUMPECTOMY WITH LEFT AXILLARY SENTINEL LYMPH NODE BIOPSY;  Surgeon: [name], MD;  Location: [facility];  Service: General;  Laterality: Left;  . RE-EXCISION OF BREAST LUMPECTOMY Left 07/01/2015   Procedure: RE-EXCISION OF LEFT BREAST INFERIOR MARGIN;  Surgeon: [name], MD;  Location: [facility];  Service: General;  Laterality: Left;  . UPPER GASTROINTESTINAL ENDOSCOPY    . UPPER GI ENDOSCOPY  10/2010   Dr [name]  .  WISDOM TOOTH EXTRACTION  1978    There were no vitals filed for this visit.  Subjective Assessment - 01/16/18 1016    Subjective  sore but feel like "you stretching and pulling was helpful"    Currently in Pain?  Yes    Pain Score  4     Pain Location  Shoulder    Pain Orientation  Right         [facility] PT Assessment - 01/16/18 0001      AROM   AROM Assessment Site  Shoulder    Right/Left Shoulder  Right   standing   Right Shoulder Flexion  175 Degrees    Right Shoulder ABduction  175 Degrees    Right Shoulder Internal Rotation  75 Degrees                   [facility] Adult PT Treatment/Exercise - 01/16/18 0001      Exercises   Exercises  Shoulder;Neck      Neck Exercises: Machines for Strengthening   UBE (Upper Arm Bike)  3 fwd/3 back L 4      Shoulder Exercises: Supine   Horizontal ABduction  Strengthening;Both;20 reps;Weights    Horizontal ABduction Weight (lbs)  4    Flexion  Strengthening;Both;20 reps;Weights  Shoulder Flexion Weight (lbs)  4    Other Supine Exercises  chest press 4# 2 sets 10    Other Supine Exercises  IR/ER 2 sets 10 RT      Shoulder Exercises: Seated   Other Seated Exercises  5# row,ext and horz abd 2 sets 15      Shoulder Exercises: Sidelying   External Rotation  Strengthening;Right;20 reps;Weights    External Rotation Weight (lbs)  4      Modalities   Modalities  Vasopneumatic      Electrical Stimulation   Electrical Stimulation Location  rt shld    Electrical Stimulation Action  IFC    Electrical Stimulation Goals  Pain      Vasopneumatic   Number Minutes Vasopneumatic   15 minutes    Vasopnuematic Location   Shoulder    Vasopneumatic Pressure  Medium    Vasopneumatic Temperature   34      Manual Therapy   Manual Therapy  Joint mobilization;Passive ROM    Joint Mobilization  joint capsule stretch    Passive ROM  PROM with some contract relax to stretch capsule at end range, horizontal adduction   distraction with  flex and abd which stops end range pain/pin              PT Short Term Goals - 12/10/17 1047      PT SHORT TERM GOAL #1   Title  I with HEP    Status  Achieved        PT Long Term Goals - 01/07/18 1704      PT LONG TERM GOAL #1   Title  Patient able to don/doff shirt without bending over.    Status  Partially Met      PT LONG TERM GOAL #2   Title  Patient able to perform ADLS without limitation in ROM    Status  Partially Met      PT LONG TERM GOAL #3   Title  Patient able to perform ADLs with pain of 2/10 or less in R shoulder.    Status  Partially Met            Plan - 01/16/18 1055    Clinical Impression Statement  pt continud to have good AROM standing but when supine and scap is stab slight decrease and pain at end range ,pain is dimenished with distratcion.     PT Treatment/Interventions  ADLs/Self Care Home Management;Electrical Stimulation;Cryotherapy;Moist Heat;Ultrasound;Patient/family education;Neuromuscular re-education;Therapeutic exercise;Manual techniques;Dry needling;Passive range of motion    PT Next Visit Plan  advance her to a safe return to the gym       Patient will benefit from skilled therapeutic intervention in order to improve the following deficits and impairments:  Pain, Decreased strength, Decreased range of motion  Visit Diagnosis: Acute pain of right shoulder  Stiffness of right shoulder, not elsewhere classified  Localized swelling, mass and lump, left upper limb     Problem List Patient Active Problem List   Diagnosis Date Noted  . Right rotator cuff tear 10/15/2017  . Axillary lymphadenopathy 02/26/2017  . Tinnitus of both ears 01/01/2017  . Reaction to internal stress 05/29/2016  . Prediabetes 08/10/2015  . 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[place]) 05/20/2015  . Open-angle glaucoma 06/17/2014  . Vitamin D deficiency 10/18/2011  . HSV infection   . Atrophic vaginitis   . VARICOSE VEINS,  LOWER EXTREMITIES 09/28/2009  . GERD 07/14/2009  . Hyperlipidemia 12/26/2006  .  GILBERT'S SYNDROME 08/09/2006  . DEGENERATIVE DISC DISEASE 08/09/2006    Lindsey Barrett,Lindsey Barrett  PTA 01/16/2018, 10:57 AM  [facility] [address] [place], [place], [address] Phone: [phone]   Fax:  [phone]  Name: Lindsey Barrett MRN: [id number] Date of Birth: 19-Nov-1945

## 2018-01-20 ENCOUNTER — Telehealth: Payer: Self-pay | Admitting: Gastroenterology

## 2018-01-20 NOTE — Telephone Encounter (Signed)
Left message for patient to call back  

## 2018-01-20 NOTE — Telephone Encounter (Signed)
Patient states she saw somewhere that medication zantac has a cancer causing agent in it. Patient is concerned since she takes it twice daily and wants to speak with nurse about other options.

## 2018-01-20 NOTE — Telephone Encounter (Signed)
Patient saw a news report on Ranitidine and cancer risks and wants to know your thoughts. She has been on prescription Zantac 150 mg BID for years. She has in last couple of days cut back to once in the evening as nighttime is when she has symptoms. Discussed this with her and suggested she try Tums prn during the day. Patient would like to know your thoughts on continuing with Zantac.

## 2018-01-21 ENCOUNTER — Ambulatory Visit: Payer: Medicare Other | Admitting: Physical Therapy

## 2018-01-21 ENCOUNTER — Encounter: Payer: Self-pay | Admitting: Physical Therapy

## 2018-01-21 DIAGNOSIS — M25511 Pain in right shoulder: Secondary | ICD-10-CM

## 2018-01-21 DIAGNOSIS — M25611 Stiffness of right shoulder, not elsewhere classified: Secondary | ICD-10-CM | POA: Diagnosis not present

## 2018-01-21 DIAGNOSIS — R2232 Localized swelling, mass and lump, left upper limb: Secondary | ICD-10-CM

## 2018-01-21 NOTE — Therapy (Signed)
[facility] [place], [place], [address] Phone: [phone]   Fax:  [phone]  Physical Therapy Treatment  Patient Details  Name: Lindsey Barrett MRN: [id number] Date of Birth: Mar 21, 1946 Referring Provider: Hulan Saas   Encounter Date: 01/21/2018  PT End of Session - 01/21/18 1056    Visit Number  14    Date for PT Re-Evaluation  02/09/18    PT Start Time  1013    PT Stop Time  1108    PT Time Calculation (min)  55 min    Activity Tolerance  Patient tolerated treatment well    Behavior During Therapy  [facility] for tasks assessed/performed       Past Medical History:  Diagnosis Date  . Anxiety   . Atrophic vaginitis   . Breast cancer ([facility])   . 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[place]) 05/20/2015  . Breast cyst    x3  . DDD (degenerative disc disease)   . Diverticulosis   . Fasting hyperglycemia   . GERD (gastroesophageal reflux disease)   . Gilbert syndrome   . Glaucoma   . History of hiatal hernia   . HSV infection   . Hyperlipidemia   . Radiation    left breast 50.4 gray  . STD (sexually transmitted disease)    HSV    Past Surgical History:  Procedure Laterality Date  . APPENDECTOMY  1961  . BREAST CYST ASPIRATION     x2 right ,x1 left  . COLONOSCOPY  2007   neg  . DILATION AND CURETTAGE OF UTERUS  1975  . FLEXIBLE SIGMOIDOSCOPY     x2  . PELVIC LAPAROSCOPY    . RADIOACTIVE SEED GUIDED PARTIAL MASTECTOMY WITH AXILLARY SENTINEL LYMPH NODE BIOPSY Left 05/31/2015   Procedure: LEFT BREAST RADIOACTIVE SEED GUIDED LUMPECTOMY WITH LEFT AXILLARY SENTINEL LYMPH NODE BIOPSY;  Surgeon: [name], MD;  Location: [facility];  Service: General;  Laterality: Left;  . RE-EXCISION OF BREAST LUMPECTOMY Left 07/01/2015   Procedure: RE-EXCISION OF LEFT BREAST INFERIOR MARGIN;  Surgeon: [name], MD;  Location: [facility];   Service: General;  Laterality: Left;  . UPPER GASTROINTESTINAL ENDOSCOPY    . UPPER GI ENDOSCOPY  10/2010   Dr [name]  . WISDOM TOOTH EXTRACTION  1978    There were no vitals filed for this visit.  Subjective Assessment - 01/21/18 1015    Subjective  REports sore, but overall she feels like she is getting better.  She reports that the biggest issue is sleeping on that side, reports no difficulty with doing hair or with dressing    Currently in Pain?  Yes    Pain Score  1     Pain Location  Shoulder    Pain Orientation  Right    Aggravating Factors   sleeping on the right side                       [facility] Adult PT Treatment/Exercise - 01/21/18 0001      Neck Exercises: Machines for Strengthening   UBE (Upper Arm Bike)  3 fwd/3 back L 5    Other Machines for Strengthening  quadraped wall touches      Shoulder Exercises: Seated   External Rotation  Strengthening;Right;15 reps;Weights    External Rotation Weight (lbs)  3    Other Seated Exercises  5# bent over row, 3#  bent over extension and 3# bent over reverse flies      Shoulder Exercises: Standing   Other Standing Exercises  green tband PNF 2 sets 10    Other Standing Exercises  I, Y, T 3#, then in prone 1#      Shoulder Exercises: Pulleys   Other Pulley Exercises  10# shld ext,retraction, abd and chest press    Other Pulley Exercises  5# IR/ER with pulleys       Vasopneumatic   Number Minutes Vasopneumatic   10 minutes    Vasopnuematic Location   Shoulder    Vasopneumatic Pressure  Medium      Manual Therapy   Manual Therapy  Joint mobilization;Passive ROM    Joint Mobilization  joint capsule stretch    Passive ROM  PROM with some contract relax to stretch capsule at end range, horizontal adduction               PT Short Term Goals - 12/10/17 1047      PT SHORT TERM GOAL #1   Title  I with HEP    Status  Achieved        PT Long Term Goals - 01/07/18 1704      PT LONG TERM GOAL #1    Title  Patient able to don/doff shirt without bending over.    Status  Partially Met      PT LONG TERM GOAL #2   Title  Patient able to perform ADLS without limitation in ROM    Status  Partially Met      PT LONG TERM GOAL #3   Title  Patient able to perform ADLs with pain of 2/10 or less in R shoulder.    Status  Partially Met            Plan - 01/21/18 1057    Clinical Impression Statement  Patient has some c/o posterior lateral shoulder pain wiht abduction type motions.  Overall she is much better with ROM strength and function, she feels that she understand sthe things to do at the gym but needs some cues for form and posture    PT Treatment/Interventions  ADLs/Self Care Home Management;Electrical Stimulation;Cryotherapy;Moist Heat;Ultrasound;Patient/family education;Neuromuscular re-education;Therapeutic exercise;Manual techniques;Dry needling;Passive range of motion    PT Next Visit Plan  over the next week assure tha tshe is independent and safe with gym and D/C next week    Consulted and Agree with Plan of Care  Patient       Patient will benefit from skilled therapeutic intervention in order to improve the following deficits and impairments:  Pain, Decreased strength, Decreased range of motion  Visit Diagnosis: Acute pain of right shoulder  Stiffness of right shoulder, not elsewhere classified  Localized swelling, mass and lump, left upper limb     Problem List Patient Active Problem List   Diagnosis Date Noted  . Right rotator cuff tear 10/15/2017  . Axillary lymphadenopathy 02/26/2017  . Tinnitus of both ears 01/01/2017  . Reaction to internal stress 05/29/2016  . Prediabetes 08/10/2015  . 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[place]) 05/20/2015  . Open-angle glaucoma 06/17/2014  . Vitamin D deficiency 10/18/2011  . HSV infection   . Atrophic vaginitis   . VARICOSE VEINS, LOWER EXTREMITIES 09/28/2009  . GERD 07/14/2009  .  Hyperlipidemia 12/26/2006  . GILBERT'S SYNDROME 08/09/2006  . DEGENERATIVE DISC DISEASE 08/09/2006    [name], PT 01/21/2018, 11:22 AM  [place]  [place], [place], [address] Phone: [phone]   Fax:  [phone]  Name: Lindsey Barrett MRN: [id number] Date of Birth: 08-Apr-1946

## 2018-01-21 NOTE — Telephone Encounter (Signed)
Left detailed message for patient, we had already discussed yesterday that the FDA said the [facility] levels were just slightly above what is already in water, meats, dairy products and vegetables. Let her know that she could try Pecid or OTC Prilosec and stop the Zantac. Advised to call back if she has further questions or concerns.

## 2018-01-21 NOTE — Telephone Encounter (Signed)
Almyra Free please relay the following to Mrs. Eggenberger. This is a new story in the news and we do not know all of the details yet. A very low level of [place] has been found in the drug. The FDA has said [place] is dangerous in large quantities, but the amount found in the heartburn drugs barely exceeds levels found in common foods. The FDA is investigating whether low levels of this in Zantac is a risk to patients and will post its findings as soon as possible, as for now we don't know. For now I would stop it and use an alternative such as Pepsid, which is in the same class and I don't think has this issue (not been reported). She could alternatively use Prilosec OTC. Thanks

## 2018-01-23 ENCOUNTER — Telehealth: Payer: Self-pay | Admitting: Gastroenterology

## 2018-01-23 NOTE — Telephone Encounter (Signed)
See previous phone notes 

## 2018-01-24 ENCOUNTER — Ambulatory Visit: Payer: Medicare Other | Admitting: Physical Therapy

## 2018-01-24 ENCOUNTER — Telehealth: Payer: Self-pay | Admitting: Gastroenterology

## 2018-01-24 DIAGNOSIS — R2232 Localized swelling, mass and lump, left upper limb: Secondary | ICD-10-CM

## 2018-01-24 DIAGNOSIS — M25611 Stiffness of right shoulder, not elsewhere classified: Secondary | ICD-10-CM | POA: Diagnosis not present

## 2018-01-24 DIAGNOSIS — M25511 Pain in right shoulder: Secondary | ICD-10-CM | POA: Diagnosis not present

## 2018-01-24 NOTE — Telephone Encounter (Signed)
Okay that's fine, thanks

## 2018-01-24 NOTE — Therapy (Signed)
[facility], [place], [address] Phone: [phone]   Fax:  [phone]  Physical Therapy Treatment  Patient Details  Name: Lindsey Barrett MRN: [id number] Date of Birth: July 02, 1945 Referring Provider: Hulan Saas   Encounter Date: 01/24/2018  PT End of Session - 01/24/18 1037    Visit Number  15    Date for PT Re-Evaluation  02/09/18    PT Start Time  1011    PT Stop Time  1106    PT Time Calculation (min)  55 min       Past Medical History:  Diagnosis Date  . Anxiety   . Atrophic vaginitis   . Breast cancer ([place])   . 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[place]) 05/20/2015  . Breast cyst    x3  . DDD (degenerative disc disease)   . Diverticulosis   . Fasting hyperglycemia   . GERD (gastroesophageal reflux disease)   . Gilbert syndrome   . Glaucoma   . History of hiatal hernia   . HSV infection   . Hyperlipidemia   . Radiation    left breast 50.4 gray  . STD (sexually transmitted disease)    HSV    Past Surgical History:  Procedure Laterality Date  . APPENDECTOMY  1961  . BREAST CYST ASPIRATION     x2 right ,x1 left  . COLONOSCOPY  2007   neg  . DILATION AND CURETTAGE OF UTERUS  1975  . FLEXIBLE SIGMOIDOSCOPY     x2  . PELVIC LAPAROSCOPY    . RADIOACTIVE SEED GUIDED PARTIAL MASTECTOMY WITH AXILLARY SENTINEL LYMPH NODE BIOPSY Left 05/31/2015   Procedure: LEFT BREAST RADIOACTIVE SEED GUIDED LUMPECTOMY WITH LEFT AXILLARY SENTINEL LYMPH NODE BIOPSY;  Surgeon: [name], MD;  Location: [facility];  Service: General;  Laterality: Left;  . RE-EXCISION OF BREAST LUMPECTOMY Left 07/01/2015   Procedure: RE-EXCISION OF LEFT BREAST INFERIOR MARGIN;  Surgeon: [name], MD;  Location: [facility];  Service: General;  Laterality: Left;  . UPPER GASTROINTESTINAL ENDOSCOPY    . UPPER GI ENDOSCOPY  10/2010   Dr [name]  .  WISDOM TOOTH EXTRACTION  1978    There were no vitals filed for this visit.  Subjective Assessment - 01/24/18 1011    Subjective  soreness vs pain    Currently in Pain?  No/denies                       [facility] Adult PT Treatment/Exercise - 01/24/18 0001      Neck Exercises: Machines for Strengthening   UBE (Upper Arm Bike)  3 fwd/3 back L 5    Cybex Row  25# 2 SETS 15    Lat Pull  25# 2 sets 15     Other Machines for Strengthening  chest press 2 sets 15 10 #      Shoulder Exercises: Supine   Horizontal ABduction  Strengthening;Both;20 reps;Weights      Shoulder Exercises: Standing   Other Standing Exercises  bicep10# 2 sets 15 /tricpes 20# 3 sets 10      Shoulder Exercises: Pulleys   Other Pulley Exercises  5# IR/ER with pulleys    2 sets 10     Electrical Stimulation   Electrical Stimulation Location  rt shld    Electrical Stimulation Action  IFC    Electrical Stimulation Goals  Pain  Vasopneumatic   Number Minutes Vasopneumatic   10 minutes    Vasopnuematic Location   Shoulder    Vasopneumatic Pressure  Medium      Manual Therapy   Manual Therapy  Joint mobilization;Passive ROM    Joint Mobilization  joint capsule stretch    Passive ROM  PROM with some contract relax to stretch capsule at end range, horizontal adduction             PT Education - 01/24/18 1021    Education Details  discussed gym ex safety, appropriate ex and machines to avoid    Person(s) Educated  Patient    Methods  Explanation;Demonstration    Comprehension  Verbalized understanding;Returned demonstration       PT Short Term Goals - 12/10/17 1047      PT SHORT TERM GOAL #1   Title  I with HEP    Status  Achieved        PT Long Term Goals - 01/24/18 1036      PT LONG TERM GOAL #1   Title  Patient able to don/doff shirt without bending over.    Status  Partially Met      PT LONG TERM GOAL #2   Title  Patient able to perform ADLS without limitation in ROM     Status  Achieved      PT LONG TERM GOAL #3   Title  Patient able to perform ADLs with pain of 2/10 or less in R shoulder.    Status  Partially Met            Plan - 01/24/18 1037    Clinical Impression Statement  increased machine ex today with educ on correct use and posture for gym. progressing with all goals    PT Treatment/Interventions  ADLs/Self Care Home Management;Electrical Stimulation;Cryotherapy;Moist Heat;Ultrasound;Patient/family education;Neuromuscular re-education;Therapeutic exercise;Manual techniques;Dry needling;Passive range of motion    PT Next Visit Plan  2 visits next week and D/C to gym       Patient will benefit from skilled therapeutic intervention in order to improve the following deficits and impairments:  Pain, Decreased strength, Decreased range of motion  Visit Diagnosis: Acute pain of right shoulder  Stiffness of right shoulder, not elsewhere classified  Localized swelling, mass and lump, left upper limb     Problem List Patient Active Problem List   Diagnosis Date Noted  . Right rotator cuff tear 10/15/2017  . Axillary lymphadenopathy 02/26/2017  . Tinnitus of both ears 01/01/2017  . Reaction to internal stress 05/29/2016  . Prediabetes 08/10/2015  . 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[place]) 05/20/2015  . Open-angle glaucoma 06/17/2014  . Vitamin D deficiency 10/18/2011  . HSV infection   . Atrophic vaginitis   . VARICOSE VEINS, LOWER EXTREMITIES 09/28/2009  . GERD 07/14/2009  . Hyperlipidemia 12/26/2006  . GILBERT'S SYNDROME 08/09/2006  . DEGENERATIVE DISC DISEASE 08/09/2006    Mahalia Dykes,ANGIE PTA 01/24/2018, 10:39 AM  [place] [address] [place], [place], [address] Phone: [phone]   Fax:  [phone]  Name: Lindsey Barrett MRN: [id number] Date of Birth: Aug 24, 1945

## 2018-01-24 NOTE — Telephone Encounter (Signed)
She should try high dose Pepsid if she hasn't done that, which would be 20 mg twice daily for 2 weeks. If that doesn't help, recommend prilosec 20mg  BID. She just called a few days ago, too early to say it's a failure of the medication, I would recommend giving it some more time. If symptoms remain poorly controlled in a few weeks she can touch base for additional recommendations. Thanks

## 2018-01-24 NOTE — Telephone Encounter (Signed)
Spoke to patient, she didn't want to do Pepcid as she drinks one beer or glass of wine daily. She has only used Tums in the last couple of days and that has been helping, along with watching her diet. I let her know about the Prilosec, but that was the medication she had a problem with several years ago. It was at a higher dose of 40 mg. I have made her an appointment to come in and be evaluated/discuss symptoms with AE on 9/30. Meanwhile she will use the Tums.

## 2018-01-24 NOTE — Telephone Encounter (Signed)
Left message for patient to call back  

## 2018-01-28 ENCOUNTER — Ambulatory Visit: Payer: Medicare Other | Admitting: Physical Therapy

## 2018-01-28 DIAGNOSIS — M25611 Stiffness of right shoulder, not elsewhere classified: Secondary | ICD-10-CM | POA: Diagnosis not present

## 2018-01-28 DIAGNOSIS — R2232 Localized swelling, mass and lump, left upper limb: Secondary | ICD-10-CM | POA: Diagnosis not present

## 2018-01-28 DIAGNOSIS — M25511 Pain in right shoulder: Secondary | ICD-10-CM

## 2018-01-28 NOTE — Therapy (Signed)
[facility] [place], [place], [address] Phone: [phone]   Fax:  [phone]  Physical Therapy Treatment  Patient Details  Name: Lindsey Barrett MRN: [id number] Date of Birth: 02/18/1946 Referring Provider: Hulan Saas   Encounter Date: 01/28/2018  PT End of Session - 01/28/18 1038    Visit Number  16    Date for PT Re-Evaluation  02/09/18    PT Start Time  1019    PT Stop Time  1110    PT Time Calculation (min)  51 min       Past Medical History:  Diagnosis Date  . Anxiety   . Atrophic vaginitis   . Breast cancer ([place])   . 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[facility]) 05/20/2015  . Breast cyst    x3  . DDD (degenerative disc disease)   . Diverticulosis   . Fasting hyperglycemia   . GERD (gastroesophageal reflux disease)   . Gilbert syndrome   . Glaucoma   . History of hiatal hernia   . HSV infection   . Hyperlipidemia   . Radiation    left breast 50.4 gray  . STD (sexually transmitted disease)    HSV    Past Surgical History:  Procedure Laterality Date  . APPENDECTOMY  1961  . BREAST CYST ASPIRATION     x2 right ,x1 left  . COLONOSCOPY  2007   neg  . DILATION AND CURETTAGE OF UTERUS  1975  . FLEXIBLE SIGMOIDOSCOPY     x2  . PELVIC LAPAROSCOPY    . RADIOACTIVE SEED GUIDED PARTIAL MASTECTOMY WITH AXILLARY SENTINEL LYMPH NODE BIOPSY Left 05/31/2015   Procedure: LEFT BREAST RADIOACTIVE SEED GUIDED LUMPECTOMY WITH LEFT AXILLARY SENTINEL LYMPH NODE BIOPSY;  Surgeon: [name], MD;  Location: [facility];  Service: General;  Laterality: Left;  . RE-EXCISION OF BREAST LUMPECTOMY Left 07/01/2015   Procedure: RE-EXCISION OF LEFT BREAST INFERIOR MARGIN;  Surgeon: [name], MD;  Location: [facility];  Service: General;  Laterality: Left;  . UPPER GASTROINTESTINAL ENDOSCOPY    . UPPER GI ENDOSCOPY  10/2010   Dr [name]  .  WISDOM TOOTH EXTRACTION  1978    There were no vitals filed for this visit.  Subjective Assessment - 01/28/18 1023    Subjective  busy weekend so limited exericises-stiff. still can not take shirt off O without bending over    Currently in Pain?  Yes    Pain Score  1                        [facility] Adult PT Treatment/Exercise - 01/28/18 0001      Neck Exercises: Machines for Strengthening   UBE (Upper Arm Bike)  3 fwd/3 back L 5    Cybex Row  25# 2 SETS 15    Lat Pull  25# 2 sets 15     Other Machines for Strengthening  chest press 2 sets 15 10 #      Shoulder Exercises: Seated   Other Seated Exercises  5# row,ext ,horz abd and ER 2 sets 10      Shoulder Exercises: Standing   Flexion  Strengthening;Both;10 reps;Weights   into abd   Shoulder Flexion Weight (lbs)  2    ABduction  Strengthening;Both;10 reps;Weights   into flexion     Electrical Stimulation   Electrical Stimulation Location  rt shld  Chartered certified accountant  IFC    Electrical Stimulation Goals  Pain      Vasopneumatic   Number Minutes Vasopneumatic   15 minutes    Vasopnuematic Location   Shoulder    Vasopneumatic Pressure  Medium    Vasopneumatic Temperature   35      Manual Therapy   Manual Therapy  Joint mobilization;Passive ROM    Joint Mobilization  joint capsule stretch    Passive ROM  PROM                PT Short Term Goals - 12/10/17 1047      PT SHORT TERM GOAL #1   Title  I with HEP    Status  Achieved        PT Long Term Goals - 01/24/18 1036      PT LONG TERM GOAL #1   Title  Patient able to don/doff shirt without bending over.    Status  Partially Met      PT LONG TERM GOAL #2   Title  Patient able to perform ADLS without limitation in ROM    Status  Achieved      PT LONG TERM GOAL #3   Title  Patient able to perform ADLs with pain of 2/10 or less in R shoulder.    Status  Partially Met            Plan - 01/28/18 1038    Clinical  Impression Statement  tolerated ther ex well and continue to educ on correct use and form    PT Treatment/Interventions  ADLs/Self Care Home Management;Electrical Stimulation;Cryotherapy;Moist Heat;Ultrasound;Patient/family education;Neuromuscular re-education;Therapeutic exercise;Manual techniques;Dry needling;Passive range of motion    PT Next Visit Plan  check ROM and goals, write out ex for gym. D/C       Patient will benefit from skilled therapeutic intervention in order to improve the following deficits and impairments:  Pain, Decreased strength, Decreased range of motion  Visit Diagnosis: Acute pain of right shoulder  Stiffness of right shoulder, not elsewhere classified  Localized swelling, mass and lump, left upper limb     Problem List Patient Active Problem List   Diagnosis Date Noted  . Right rotator cuff tear 10/15/2017  . Axillary lymphadenopathy 02/26/2017  . Tinnitus of both ears 01/01/2017  . Reaction to internal stress 05/29/2016  . Prediabetes 08/10/2015  . 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[facility]) 05/20/2015  . Open-angle glaucoma 06/17/2014  . Vitamin D deficiency 10/18/2011  . HSV infection   . Atrophic vaginitis   . VARICOSE VEINS, LOWER EXTREMITIES 09/28/2009  . GERD 07/14/2009  . Hyperlipidemia 12/26/2006  . GILBERT'S SYNDROME 08/09/2006  . DEGENERATIVE DISC DISEASE 08/09/2006    Rudolfo Brandow,ANGIE PTA 01/28/2018, 10:40 AM  [facility] [address] [place], [place], [address] Phone: [phone]   Fax:  [phone]  Name: CHAR FELTMAN MRN: [id number] Date of Birth: 1946/02/26

## 2018-01-31 ENCOUNTER — Encounter: Payer: Self-pay | Admitting: Physical Therapy

## 2018-01-31 ENCOUNTER — Ambulatory Visit: Payer: Medicare Other | Admitting: Physical Therapy

## 2018-01-31 DIAGNOSIS — R2232 Localized swelling, mass and lump, left upper limb: Secondary | ICD-10-CM | POA: Diagnosis not present

## 2018-01-31 DIAGNOSIS — M25511 Pain in right shoulder: Secondary | ICD-10-CM | POA: Diagnosis not present

## 2018-01-31 DIAGNOSIS — M25611 Stiffness of right shoulder, not elsewhere classified: Secondary | ICD-10-CM

## 2018-01-31 NOTE — Therapy (Signed)
[facility] [place], [place], [address] Phone: [phone]   Fax:  [phone]  Physical Therapy Treatment  Patient Details  Name: Lindsey Barrett MRN: [id number] Date of Birth: 28-Oct-1945 Referring Provider (PT): Hulan Saas   Encounter Date: 01/31/2018  PT End of Session - 01/31/18 1015    Visit Number  17    Date for PT Re-Evaluation  02/09/18    PT Start Time  1010    PT Stop Time  1105    PT Time Calculation (min)  55 min       Past Medical History:  Diagnosis Date  . Anxiety   . Atrophic vaginitis   . Breast cancer ([facility])   . 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[facility]) 05/20/2015  . Breast cyst    x3  . DDD (degenerative disc disease)   . Diverticulosis   . Fasting hyperglycemia   . GERD (gastroesophageal reflux disease)   . Gilbert syndrome   . Glaucoma   . History of hiatal hernia   . HSV infection   . Hyperlipidemia   . Radiation    left breast 50.4 gray  . STD (sexually transmitted disease)    HSV    Past Surgical History:  Procedure Laterality Date  . APPENDECTOMY  1961  . BREAST CYST ASPIRATION     x2 right ,x1 left  . COLONOSCOPY  2007   neg  . DILATION AND CURETTAGE OF UTERUS  1975  . FLEXIBLE SIGMOIDOSCOPY     x2  . PELVIC LAPAROSCOPY    . RADIOACTIVE SEED GUIDED PARTIAL MASTECTOMY WITH AXILLARY SENTINEL LYMPH NODE BIOPSY Left 05/31/2015   Procedure: LEFT BREAST RADIOACTIVE SEED GUIDED LUMPECTOMY WITH LEFT AXILLARY SENTINEL LYMPH NODE BIOPSY;  Surgeon: [name], MD;  Location: [facility];  Service: General;  Laterality: Left;  . RE-EXCISION OF BREAST LUMPECTOMY Left 07/01/2015   Procedure: RE-EXCISION OF LEFT BREAST INFERIOR MARGIN;  Surgeon: [name], MD;  Location: [facility];  Service: General;  Laterality: Left;  . UPPER GASTROINTESTINAL ENDOSCOPY    . UPPER GI ENDOSCOPY  10/2010   Dr [name]   . WISDOM TOOTH EXTRACTION  1978    There were no vitals filed for this visit.  Subjective Assessment - 01/31/18 1012    Subjective  I felt the change to 5#    Currently in Pain?  Yes    Pain Score  2     Pain Location  Shoulder    Pain Orientation  Right         [facility] PT Assessment - 01/31/18 0001      AROM   AROM Assessment Site  Shoulder    Right/Left Shoulder  Right   standing   Right Shoulder Flexion  180 Degrees    Right Shoulder ABduction  180 Degrees    Right Shoulder Internal Rotation  80 Degrees    Right Shoulder External Rotation  90 Degrees                   [facility] Adult PT Treatment/Exercise - 01/31/18 0001      Neck Exercises: Machines for Strengthening   UBE (Upper Arm Bike)  3 fwd/3 back L 5    Cybex Row  25# 2 SETS 15    Lat Pull  25# 2 sets 15     Other Machines for Strengthening  chest press 2 sets 15 10 #  Shoulder Exercises: Seated   Other Seated Exercises  4# row,ext ,horz abd and ER 2 sets 10      Shoulder Exercises: Standing   Other Standing Exercises  IR/ER 3# 2 sets 15      Electrical Stimulation   Electrical Stimulation Location  rt shld    Electrical Stimulation Action  IFC    Electrical Stimulation Goals  Pain      Vasopneumatic   Number Minutes Vasopneumatic   15 minutes    Vasopnuematic Location   Shoulder    Vasopneumatic Pressure  Medium    Vasopneumatic Temperature   35      Manual Therapy   Manual Therapy  Joint mobilization;Passive ROM    Joint Mobilization  joint capsule stretch             PT Education - 01/31/18 1028    Education Details  TENS    Person(s) Educated  Patient    Methods  Explanation;Handout    Comprehension  Verbalized understanding       PT Short Term Goals - 12/10/17 1047      PT SHORT TERM GOAL #1   Title  I with HEP    Status  Achieved        PT Long Term Goals - 01/31/18 1018      PT LONG TERM GOAL #1   Title  Patient able to don/doff shirt without bending  over.    Status  Achieved      PT LONG TERM GOAL #2   Title  Patient able to perform ADLS without limitation in ROM    Status  Achieved      PT LONG TERM GOAL #3   Title  Patient able to perform ADLs with pain of 2/10 or less in R shoulder.    Status  Achieved            Plan - 01/31/18 1021    Clinical Impression Statement  all goals met. pt with HEP with tband and educ on safe return to gym    PT Treatment/Interventions  ADLs/Self Care Home Management;Electrical Stimulation;Cryotherapy;Moist Heat;Ultrasound;Patient/family education;Neuromuscular re-education;Therapeutic exercise;Manual techniques;Dry needling;Passive range of motion    PT Next Visit Plan  D/C       Patient will benefit from skilled therapeutic intervention in order to improve the following deficits and impairments:  Pain, Decreased strength, Decreased range of motion  Visit Diagnosis: Acute pain of right shoulder  Stiffness of right shoulder, not elsewhere classified  Localized swelling, mass and lump, left upper limb     Problem List Patient Active Problem List   Diagnosis Date Noted  . Right rotator cuff tear 10/15/2017  . Axillary lymphadenopathy 02/26/2017  . Tinnitus of both ears 01/01/2017  . Reaction to internal stress 05/29/2016  . Prediabetes 08/10/2015  . 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[facility]) 05/20/2015  . Open-angle glaucoma 06/17/2014  . Vitamin D deficiency 10/18/2011  . HSV infection   . Atrophic vaginitis   . VARICOSE VEINS, LOWER EXTREMITIES 09/28/2009  . GERD 07/14/2009  . Hyperlipidemia 12/26/2006  . GILBERT'S SYNDROME 08/09/2006  . DEGENERATIVE [place] DISEASE 08/09/2006   PHYSICAL THERAPY DISCHARGE SUMMARY   Plan: Patient agrees to discharge.  Patient goals were met. Patient is being discharged due to meeting the stated rehab goals.  ?????      Shulamit Donofrio,ANGIE  PTA 01/31/2018, 10:29 AM  [facility] [phone]  [place], [place], [address] Phone: [phone]   Fax:  [phone]  Name: ADAMARI FREDE MRN: [id number] Date of Birth: July 13, 1945

## 2018-02-03 ENCOUNTER — Ambulatory Visit (INDEPENDENT_AMBULATORY_CARE_PROVIDER_SITE_OTHER): Payer: Medicare Other | Admitting: Physician Assistant

## 2018-02-03 ENCOUNTER — Encounter: Payer: Self-pay | Admitting: Physician Assistant

## 2018-02-03 VITALS — BP 124/72 | HR 62 | Ht 65.0 in | Wt 173.2 lb

## 2018-02-03 DIAGNOSIS — K219 Gastro-esophageal reflux disease without esophagitis: Secondary | ICD-10-CM

## 2018-02-03 DIAGNOSIS — Z86010 Personal history of colonic polyps: Secondary | ICD-10-CM | POA: Diagnosis not present

## 2018-02-03 MED ORDER — FAMOTIDINE 20 MG PO TABS: ORAL_TABLET | ORAL | 3 refills | Status: DC

## 2018-02-03 NOTE — Progress Notes (Signed)
Agree with assessment and plan as outlined.  

## 2018-02-03 NOTE — Progress Notes (Signed)
Subjective:    Patient ID: Lindsey Barrett, female    DOB: 03/11/1946, 72 y.o.   MRN: [id number]  HPI Lindsey Barrett is a very nice 72 year old white female known to Dr. Havery Moros with history of GERD, adenomatous colon polyps, Lindsey Barrett bears, and history of breast cancer.   She has been managing her GERD symptoms with ranitidine 150 mg p.o. twice daily comes in today to discuss her options since Zantac/ranitidine is getting pulled off of the market because of cancer concerns. She says she has not been taking Zantac over the past 8 days and for the most part has done okay.  She said the first couple of days her system was all out of whack but since then she is been using Tums which is been working fairly well.  She says she does get some late nighttime type symptoms if she goes multiple hours between dinner and going to bed she will tend to develop some gas and dyspeptic symptoms usually alleviated by eating crackers.  Does not have any nocturnal symptoms after going to sleep.  She also describes a lot of anxiety recently having difficulty sleeping etc. after having a nosebleed recently in the middle the night which scared her. She is very concerned about potential side effects of medications medication interactions etc. No dysphagia or odynophagia.  She says she had tried Prilosec and Nexium in the past and did not tolerate either of them well as they made her feel a little bit "loopy".  Last EGD was done in 2012 per Dr. [name] with finding of an irregular Z line however biopsy showed changes consistent with GERD no Barrett's the gastric mucosa was benign.   Last colonoscopy October 2017 with one 5 mm tubular adenoma removed and noted to have diverticulosis in the transverse and left colon.   Review of Systems Pertinent positive and negative review of systems were noted in the above HPI section.  All other review of systems was otherwise negative.  Outpatient Encounter Medications as of 02/03/2018    Medication Sig  . aspirin 81 MG tablet Take 81 mg by mouth as directed.   . bimatoprost (LUMIGAN) 0.01 % SOLN Place 1 drop into both eyes at bedtime.  . brinzolamide (AZOPT) 1 % ophthalmic suspension 1 drop 2 (two) times daily.  . calcium carbonate (TUMS - DOSED IN MG ELEMENTAL CALCIUM) 500 MG chewable tablet Chew 1 tablet by mouth as needed for indigestion or heartburn (1000 mg).   . Cholecalciferol (VITAMIN D3 PO) Take 1 tablet by mouth daily.  [name] ibuprofen (ADVIL,MOTRIN) 200 MG tablet Take 200 mg by mouth every 6 (six) hours as needed.  [name] letrozole (FEMARA) 2.5 MG tablet Take 1 tablet (2.5 mg total) by mouth daily.  . rosuvastatin (CRESTOR) 40 MG tablet TAKE 1 TABLET BY MOUTH ONCE DAILY  . famotidine (PEPCID) 20 MG tablet Take 1 tablet by mouth twice daily before breakfast and dinner.  . [DISCONTINUED] metoprolol tartrate (LOPRESSOR) 25 MG tablet Take 1 tablet (25 mg total) by mouth as directed. 1 pill every 8 hours as needed for tachycardia or palpitations.  . [DISCONTINUED] ranitidine (ZANTAC) 150 MG tablet Take 1 tablet (150 mg total) by mouth 2 (two) times daily as needed. (Patient not taking: Reported on 02/03/2018)   No facility-administered encounter medications on file as of 02/03/2018.    Allergies  Allergen Reactions  . Septra Ds [Sulfamethoxazole-Trimethoprim] Hives  . Arimidex [Anastrozole] Other (See Comments)    Unable to function  . Sulfa Antibiotics   .  Adhesive [Tape] Rash   Patient Active Problem List   Diagnosis Date Noted  . Right rotator cuff tear 10/15/2017  . Axillary lymphadenopathy 02/26/2017  . Tinnitus of both ears 01/01/2017  . Reaction to internal stress 05/29/2016  . Prediabetes 08/10/2015  . 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[facility]) 05/20/2015  . Open-angle glaucoma 06/17/2014  . Vitamin D deficiency 10/18/2011  . HSV infection   . Atrophic vaginitis   . VARICOSE VEINS, LOWER EXTREMITIES 09/28/2009  . GERD 07/14/2009  .  Hyperlipidemia 12/26/2006  . GILBERT'S SYNDROME 08/09/2006  . DEGENERATIVE DISC DISEASE 08/09/2006   Social History   Socioeconomic History  . Marital status: Divorced    Spouse name: Not on file  . Number of children: 0  . Years of education: Not on file  . Highest education level: Not on file  Occupational History  . Occupation: Environmental health practitioner for [employer]  . Financial resource strain: Not hard at all  . Food insecurity:    Worry: Never true    Inability: Never true  . Transportation needs:    Medical: No    Non-medical: No  Tobacco Use  . Smoking status: Former Smoker    Types: Cigarettes    Last attempt to quit: 03/28/1999    Years since quitting: 18.8  . Smokeless tobacco: Never Used  . Tobacco comment: smoked 1967-2000, up to 3/4 ppd  Substance and Sexual Activity  . Alcohol use: Yes    Alcohol/week: 2.0 standard drinks    Types: 2 Cans of beer per week  . Drug use: No  . Sexual activity: Never    Birth control/protection: Post-menopausal    Comment: 1st intercourse 72 yo-More than 5 partners  Lifestyle  . Physical activity:    Days per week: 5 days    Minutes per session: 60 min  . Stress: Not at all  Relationships  . Social connections:    Talks on phone: More than three times a week    Gets together: More than three times a week    Attends religious service: More than 4 times per year    Active member of club or organization: Yes    Attends meetings of clubs or organizations: More than 4 times per year    Relationship status: Divorced  . Intimate partner violence:    Fear of current or ex partner: Not on file    Emotionally abused: Not on file    Physically abused: Not on file    Forced sexual activity: Not on file  Other Topics Concern  . 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      Regular exercise-no    Ms. Bovard's family history includes Breast cancer in her  paternal aunt; Cancer in her father and mother; Colon cancer in her paternal aunt; Coronary artery disease in her father; Endometriosis in her mother; Leukemia in her maternal uncle; Liver cancer in her maternal uncle; Lung cancer in her mother; Stomach cancer in her maternal grandmother; Stroke (age of onset: 23) in her paternal grandfather; Sudden death in her sister; Vascular Disease in her paternal grandmother.      Objective:    Vitals:   02/03/18 1442  BP: 124/72  Pulse: 62  SpO2: 98%    Physical Exam; left older white female in no acute distress, pleasant, somewhat anxious blood pressure 124/72 pulse 62, height  5 foot 5, weight 173, BMI 28.83 HEENT; nontraumatic normocephalic EOMI PERRLA sclera anicteric, Neuro psych; alert and oriented, grossly nonfocal mood and affect appropriate not further examined today       Assessment & Plan:   #64 72 year old white female with chronic GERD, generally well controlled with ranitidine 150 mg p.o. twice daily.  Patient has stopped ranitidine due to recent FDA concerns regarding cancer risk, who comes in to discuss options. She generally has more indigestion/dyspeptic symptoms with belching than true heartburn.  #2 adenomatous colon polyps-up-to-date with colonoscopy due for follow-up 2022 #3 history of breast cancer #4. Gilberts syndrome #5 anxiety  Plan; Lengthy discussion with patient today. Strict antireflux regimen We will switch to famotidine/Pepcid 20 mg p.o. twice daily AC breakfast and AC dinner, she may use Tums as needed. Patient will follow-up with Dr. Havery Moros or myself on an as-needed basis, and is encouraged to call if she is not finding Pepcid to be sufficient.  Greater than 50% of visit today spent in education and counseling regarding management of ongoing GERD and dyspeptic symptoms, and additional counseling regarding situational anxiety.    Lindsey Hazelett S Makayli Bracken PA-C 02/03/2018   Cc: [name], MD

## 2018-02-03 NOTE — Patient Instructions (Addendum)
We sent refills for Pepcid 20 mg to [address].  Take 1 tablet by mouth twice daily before breakfast and dinner. We have given you information on a antireflux diet.   Follow up as needed.   You are due to a Colonoscopy October 2022. You will get a letter September of that year.   Normal BMI (Body Mass Index- based on height and weight) is between 23 and 30. Your BMI today is Body mass index is 28.82 kg/m. [name] Please consider follow up  regarding your BMI with your Primary Care Provider.

## 2018-02-04 NOTE — Progress Notes (Signed)
Subjective:    Patient ID: Lindsey Barrett, female    DOB: [birth year], 72 y.o.   MRN: [id number]  HPI The patient is here for an acute visit.  On 9/21 she had a nose bleed out of her right nostril.  It was in the middle of the night.  She called the on call nurse and pinched her nose and leaned forward for 15 minutes.  She continued to hold her nose for 15 minutes.  She stopped holding pressure and there was no bleeding.  There has been no bleeding since.  She did have some clots with the bleeding.  The nurse advised her she likely had a dry nosebleed.  The nurse advised a humidifier in her bedroom at night.  She also advised vaseline in the nose at night and saline nasal spray.   She has been doing all of this religiously and she has not had any other issues with bleeding.  Medications and allergies reviewed with patient and updated if appropriate.  Patient Active Problem List   Diagnosis Date Noted  . Right rotator cuff tear 10/15/2017  . Axillary lymphadenopathy 02/26/2017  . Tinnitus of both ears 01/01/2017  . Reaction to internal stress 05/29/2016  . Prediabetes 08/10/2015  . 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[place]) 05/20/2015  . Open-angle glaucoma 06/17/2014  . Vitamin D deficiency 10/18/2011  . HSV infection   . Atrophic vaginitis   . VARICOSE VEINS, LOWER EXTREMITIES 09/28/2009  . GERD 07/14/2009  . Hyperlipidemia 12/26/2006  . GILBERT'S SYNDROME 08/09/2006  . DEGENERATIVE DISC DISEASE 08/09/2006    Current Outpatient Medications on File Prior to Visit  Medication Sig Dispense Refill  . aspirin 81 MG tablet Take 81 mg by mouth as directed.     . bimatoprost (LUMIGAN) 0.01 % SOLN Place 1 drop into both eyes at bedtime.    . brinzolamide (AZOPT) 1 % ophthalmic suspension 1 drop 2 (two) times daily.    . calcium carbonate (TUMS - DOSED IN MG ELEMENTAL CALCIUM) 500 MG chewable tablet Chew 1 tablet by mouth as needed for indigestion or  heartburn (1000 mg).     . Cholecalciferol (VITAMIN D3 PO) Take 1 tablet by mouth daily.    . famotidine (PEPCID) 20 MG tablet Take 1 tablet by mouth twice daily before breakfast and dinner. 60 tablet 3  . ibuprofen (ADVIL,MOTRIN) 200 MG tablet Take 200 mg by mouth every 6 (six) hours as needed.    [name] letrozole (FEMARA) 2.5 MG tablet Take 1 tablet (2.5 mg total) by mouth daily. 90 tablet 3  . rosuvastatin (CRESTOR) 40 MG tablet TAKE 1 TABLET BY MOUTH ONCE DAILY 90 tablet 2  . [DISCONTINUED] metoprolol tartrate (LOPRESSOR) 25 MG tablet Take 1 tablet (25 mg total) by mouth as directed. 1 pill every 8 hours as needed for tachycardia or palpitations. 30 tablet 2   No current facility-administered medications on file prior to visit.     Past Medical History:  Diagnosis Date  . Anxiety   . Atrophic vaginitis   . Breast cancer ([place])   . 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[place]) 05/20/2015  . Breast cyst    x3  . DDD (degenerative disc disease)   . Diverticulosis   . Fasting hyperglycemia   . GERD (gastroesophageal reflux disease)   . Gilbert syndrome   . Glaucoma   . History of hiatal hernia   . HSV infection   . Hyperlipidemia   .  Radiation    left breast 50.4 gray  . STD (sexually transmitted disease)    HSV    Past Surgical History:  Procedure Laterality Date  . APPENDECTOMY  1961  . BREAST CYST ASPIRATION     x2 right ,x1 left  . COLONOSCOPY  2007   neg  . DILATION AND CURETTAGE OF UTERUS  1975  . FLEXIBLE SIGMOIDOSCOPY     x2  . PELVIC LAPAROSCOPY    . RADIOACTIVE SEED GUIDED PARTIAL MASTECTOMY WITH AXILLARY SENTINEL LYMPH NODE BIOPSY Left 05/31/2015   Procedure: LEFT BREAST RADIOACTIVE SEED GUIDED LUMPECTOMY WITH LEFT AXILLARY SENTINEL LYMPH NODE BIOPSY;  Surgeon: [name], MD;  Location: [place];  Service: General;  Laterality: Left;  . RE-EXCISION OF BREAST LUMPECTOMY Left 07/01/2015   Procedure: RE-EXCISION OF LEFT  BREAST INFERIOR MARGIN;  Surgeon: [name], MD;  Location: [place];  Service: General;  Laterality: Left;  . UPPER GASTROINTESTINAL ENDOSCOPY    . UPPER GI ENDOSCOPY  10/2010   Dr [name]  . WISDOM TOOTH EXTRACTION  1978    Social History   Socioeconomic History  . Marital status: Divorced    Spouse name: Not on file  . Number of children: 0  . Years of education: Not on file  . Highest education level: Not on file  Occupational History  . Occupation: Environmental health practitioner for [employer]  . Financial resource strain: Not hard at all  . Food insecurity:    Worry: Never true    Inability: Never true  . Transportation needs:    Medical: No    Non-medical: No  Tobacco Use  . Smoking status: Former Smoker    Types: Cigarettes    Last attempt to quit: 03/28/1999    Years since quitting: 18.8  . Smokeless tobacco: Never Used  . Tobacco comment: smoked 1967-2000, up to 3/4 ppd  Substance and Sexual Activity  . Alcohol use: Yes    Alcohol/week: 2.0 standard drinks    Types: 2 Cans of beer per week  . Drug use: No  . Sexual activity: Never    Birth control/protection: Post-menopausal    Comment: 1st intercourse 72 yo-More than 5 partners  Lifestyle  . Physical activity:    Days per week: 5 days    Minutes per session: 60 min  . Stress: Not at all  Relationships  . Social connections:    Talks on phone: More than three times a week    Gets together: More than three times a week    Attends religious service: More than 4 times per year    Active member of club or organization: Yes    Attends meetings of clubs or organizations: More than 4 times per year    Relationship status: Divorced  Other Topics Concern  . 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      Regular exercise-no    Family History  Problem Relation Age of Onset  . Lung cancer Mother        smoker  . Endometriosis  Mother   . Cancer Mother        Bladder cancer  . Stomach cancer Maternal Grandmother   . Coronary artery disease Father   . Cancer Father        intestinal carcinoid tumors  . Sudden death Sister        1  week old, congenital deformity  . Liver cancer Maternal Uncle   . Stroke Paternal Grandfather 15  . Leukemia Maternal Uncle   . Breast cancer Paternal Aunt        great paternal aunt-Age 13's  . Colon cancer Paternal Aunt        lived to 1; pat great aunt  . Vascular Disease Paternal Grandmother        thoracic aneurysm  . Esophageal cancer Neg Hx   . Rectal cancer Neg Hx   . Pancreatic cancer Neg Hx     Review of Systems  Constitutional: Negative for fever.  HENT: Positive for nosebleeds (No bleeding since the first void). Negative for postnasal drip and rhinorrhea.        No pain in the nasal passageways  Neurological: Negative for dizziness, light-headedness and headaches.       Objective:   Vitals:   02/05/18 1453  BP: 122/74  Pulse: 73  Resp: 16  Temp: 98.7 F (37.1 C)  SpO2: 96%   BP Readings from Last 3 Encounters:  02/05/18 122/74  02/03/18 124/72  01/10/18 126/64   Wt Readings from Last 3 Encounters:  02/05/18 172 lb (78 kg)  02/03/18 173 lb 3.2 oz (78.6 kg)  01/10/18 174 lb (78.9 kg)   Body mass index is 28.62 kg/m.   Physical Exam  Constitutional: She appears well-developed and well-nourished. No distress.  HENT:  Nose: Nose normal.  No current nose bleed  Skin: Skin is warm and dry. She is not diaphoretic.           Assessment & Plan:    See Problem List for Assessment and Plan of chronic medical problems.

## 2018-02-05 ENCOUNTER — Ambulatory Visit (INDEPENDENT_AMBULATORY_CARE_PROVIDER_SITE_OTHER): Payer: Medicare Other | Admitting: Internal Medicine

## 2018-02-05 ENCOUNTER — Encounter: Payer: Self-pay | Admitting: Internal Medicine

## 2018-02-05 VITALS — BP 122/74 | HR 73 | Temp 98.7°F | Resp 16 | Ht 65.0 in | Wt 172.0 lb

## 2018-02-05 DIAGNOSIS — Z23 Encounter for immunization: Secondary | ICD-10-CM

## 2018-02-05 DIAGNOSIS — R040 Epistaxis: Secondary | ICD-10-CM

## 2018-02-05 NOTE — Assessment & Plan Note (Signed)
Had an episode of nosebleeding-right nostril secondary to a dry nose Was able to stop the bleeding with the help of the on-call nurse Has been using saline nasal spray, Vaseline/Vicks in her nose at night and has humidifier in her house No additional bleeding Continue current measures.  Discussed she can try Afrin if she has acute bleeding Discussed the importance of pressure if she does have another nosebleed Follow-up as needed

## 2018-02-05 NOTE — Patient Instructions (Signed)
Continue saline nasal spray.  Vaseline into the nostrils at night.   Continue to use the humidifiers.      Nosebleed, Adult A nosebleed is when blood comes out of the nose. Nosebleeds are common. Usually, they are not a sign of a serious condition. Nosebleeds can happen if a small blood vessel in your nose starts to bleed or if the lining of your nose (mucous membrane) cracks. They are commonly caused by:  Allergies.  Colds.  Picking your nose.  Blowing your nose too hard.  An injury from sticking an object into your nose or getting hit in the nose.  Dry or cold air.  Less common causes of nosebleeds include:  Toxic fumes.  Something abnormal in the nose or in the air-filled spaces in the bones of the face (sinuses).  Growths in the nose, such as polyps.  Medicines or conditions that cause blood to clot slowly.  Certain illnesses or procedures that irritate or dry out the nasal passages.  Follow these instructions at home: When you have a nosebleed:  Sit down and tilt your head slightly forward.  Use a clean towel or tissue to pinch your nostrils under the bony part of your nose. After 10 minutes, let go of your nose and see if bleeding starts again. Do not release pressure before that time. If there is still bleeding, repeat the pinching and holding for 10 minutes until the bleeding stops.  Do not place tissues or gauze in the nose to stop bleeding.  Avoid lying down and avoid tilting your head backward. That may make blood collect in the throat and cause gagging or coughing.  Use a nasal spray decongestant to help with a nosebleed as told by your health care provider.  Do not use petroleum jelly or mineral oil in your nose. It can drip into your lungs. After a nosebleed:  Avoid blowing your nose or sniffing for a number of hours.  Avoid straining, lifting, or bending at the waist for several days. You may resume other normal activities as you are able.  Use  saline spray or a humidifier as told by your health care provider.  Aspirinand blood thinners make bleeding more likely. If you are prescribed these medicines and you suffer from nosebleeds: ? Ask your health care provider if you should stop taking the medicines or if you should adjust the dose. ? Do not stop taking medicines that your health care provider has recommended unless told by your health care provider.  If your nosebleed was caused by dry mucous membranes, use over-the-counter saline nasal spray or gel. This will keep the mucous membranes moist and allow them to heal. If you must use a lubricant: ? Choose one that is water-soluble. ? Use only as much as you need and use it only as often as needed. ? Do not lie down until several hours after you use it. Contact a health care provider if:  You have a fever.  You get nosebleeds often or more often than usual.  You bruise very easily.  You have a nosebleed from having something stuck in your nose.  You have bleeding in your mouth.  You vomit or cough up brown material.  You have a nosebleed after you start a new medicine. Get help right away if:  You have a nosebleed after a fall or a head injury.  Your nosebleed does not go away after 20 minutes.  You feel dizzy or weak.  You have unusual  bleeding from other parts of your body.  You have unusual bruising on other parts of your body.  You become sweaty.  You vomit blood. This information is not intended to replace advice given to you by your health care provider. Make sure you discuss any questions you have with your health care provider. Document Released: 01/31/2005 Document Revised: 12/22/2015 Document Reviewed: 11/08/2015 Elsevier Interactive Patient Education  [employer].

## 2018-02-13 ENCOUNTER — Ambulatory Visit: Payer: Medicare Other

## 2018-02-28 DIAGNOSIS — H53453 Other localized visual field defect, bilateral: Secondary | ICD-10-CM | POA: Diagnosis not present

## 2018-03-03 ENCOUNTER — Other Ambulatory Visit: Payer: Self-pay | Admitting: Family

## 2018-03-03 ENCOUNTER — Ambulatory Visit (INDEPENDENT_AMBULATORY_CARE_PROVIDER_SITE_OTHER): Payer: Medicare Other | Admitting: Family

## 2018-03-03 ENCOUNTER — Encounter: Payer: Self-pay | Admitting: Family

## 2018-03-03 ENCOUNTER — Other Ambulatory Visit: Payer: Medicare Other

## 2018-03-03 VITALS — BP 118/60 | HR 101 | Temp 97.9°F | Ht 65.0 in | Wt 172.0 lb

## 2018-03-03 DIAGNOSIS — R300 Dysuria: Secondary | ICD-10-CM | POA: Diagnosis not present

## 2018-03-03 LAB — POC URINALSYSI DIPSTICK (AUTOMATED)
Bilirubin, UA: NEGATIVE
GLUCOSE UA: NEGATIVE
Ketones, UA: NEGATIVE
Protein, UA: POSITIVE — AB
SPEC GRAV UA: 1.025 (ref 1.010–1.025)
UROBILINOGEN UA: 0.2 U/dL
pH, UA: 6 (ref 5.0–8.0)

## 2018-03-03 MED ORDER — NITROFURANTOIN MONOHYD MACRO 100 MG PO CAPS: 100.0000 mg | ORAL_CAPSULE | Freq: Two times a day (BID) | ORAL | 0 refills | Status: DC

## 2018-03-03 NOTE — Progress Notes (Signed)
Lindsey Barrett is a 72 y.o. female with the following history as recorded in [employer]:  Patient Active Problem List   Diagnosis Date Noted  . Epistaxis 02/05/2018  . Right rotator cuff tear 10/15/2017  . Axillary lymphadenopathy 02/26/2017  . Tinnitus of both ears 01/01/2017  . Reaction to internal stress 05/29/2016  . Prediabetes 08/10/2015  . 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[place]) 05/20/2015  . Open-angle glaucoma 06/17/2014  . Vitamin D deficiency 10/18/2011  . HSV infection   . Atrophic vaginitis   . VARICOSE VEINS, LOWER EXTREMITIES 09/28/2009  . GERD 07/14/2009  . Hyperlipidemia 12/26/2006  . GILBERT'S SYNDROME 08/09/2006  . DEGENERATIVE DISC DISEASE 08/09/2006    Current Outpatient Medications  Medication Sig Dispense Refill  . aspirin 81 MG tablet Take 81 mg by mouth as directed.     . bimatoprost (LUMIGAN) 0.01 % SOLN Place 1 drop into both eyes at bedtime.    . brinzolamide (AZOPT) 1 % ophthalmic suspension 1 drop 2 (two) times daily.    . calcium carbonate (TUMS - DOSED IN MG ELEMENTAL CALCIUM) 500 MG chewable tablet Chew 1 tablet by mouth as needed for indigestion or heartburn (1000 mg).     . Cholecalciferol (VITAMIN D3 PO) Take 1 tablet by mouth daily.    . famotidine (PEPCID) 20 MG tablet Take 1 tablet by mouth twice daily before breakfast and dinner. 60 tablet 3  . ibuprofen (ADVIL,MOTRIN) 200 MG tablet Take 200 mg by mouth every 6 (six) hours as needed.    [name] letrozole (FEMARA) 2.5 MG tablet Take 1 tablet (2.5 mg total) by mouth daily. 90 tablet 3  . rosuvastatin (CRESTOR) 40 MG tablet TAKE 1 TABLET BY MOUTH ONCE DAILY 90 tablet 2  . nitrofurantoin, macrocrystal-monohydrate, (MACROBID) 100 MG capsule Take 1 capsule (100 mg total) by mouth 2 (two) times daily. 14 capsule 0   No current facility-administered medications for this visit.     Allergies: Septra ds [sulfamethoxazole-trimethoprim]; Arimidex [anastrozole]; Sulfa antibiotics;  and Adhesive [tape]  Past Medical History:  Diagnosis Date  . Anxiety   . Atrophic vaginitis   . Breast cancer ([place])   . 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 Breast cyst    x3  . DDD (degenerative disc disease)   . Diverticulosis   . Fasting hyperglycemia   . GERD (gastroesophageal reflux disease)   . Gilbert syndrome   . Glaucoma   . History of hiatal hernia   . HSV infection   . Hyperlipidemia   . Radiation    left breast 50.4 gray  . STD (sexually transmitted disease)    HSV    Past Surgical History:  Procedure Laterality Date  . APPENDECTOMY  1961  . BREAST CYST ASPIRATION     x2 right ,x1 left  . COLONOSCOPY  2007   neg  . DILATION AND CURETTAGE OF UTERUS  1975  . FLEXIBLE SIGMOIDOSCOPY     x2  . PELVIC LAPAROSCOPY    . RADIOACTIVE SEED GUIDED PARTIAL MASTECTOMY WITH AXILLARY SENTINEL LYMPH NODE BIOPSY Left 05/31/2015   Procedure: LEFT BREAST RADIOACTIVE SEED GUIDED LUMPECTOMY WITH LEFT AXILLARY SENTINEL LYMPH NODE BIOPSY;  Surgeon: [name], MD;  Location: [facility];  Service: General;  Laterality: Left;  . RE-EXCISION OF BREAST LUMPECTOMY Left 07/01/2015   Procedure: RE-EXCISION OF LEFT BREAST INFERIOR MARGIN;  Surgeon: [name], MD;  Location: [facility];  Service: General;  Laterality: Left;  . UPPER GASTROINTESTINAL ENDOSCOPY    . UPPER GI ENDOSCOPY  10/2010   Dr [name]  . WISDOM TOOTH EXTRACTION  1978    Family History  Problem Relation Age of Onset  . Lung cancer Mother        smoker  . Endometriosis Mother   . Cancer Mother        Bladder cancer  . Stomach cancer Maternal Grandmother   . Coronary artery disease Father   . Cancer Father        intestinal carcinoid tumors  . Sudden death Sister        57 week old, congenital deformity  . Liver cancer Maternal Uncle   . Stroke Paternal Grandfather 64  . Leukemia Maternal Uncle   . Breast cancer Paternal Aunt         great paternal aunt-Age 65's  . Colon cancer Paternal Aunt        lived to 40; pat great aunt  . Vascular Disease Paternal Grandmother        thoracic aneurysm  . Esophageal cancer Neg Hx   . Rectal cancer Neg Hx   . Pancreatic cancer Neg Hx     Social History   Tobacco Use  . Smoking status: Former Smoker    Types: Cigarettes    Last attempt to quit: 03/28/1999    Years since quitting: 18.9  . Smokeless tobacco: Never Used  . Tobacco comment: smoked 1967-2000, up to 3/4 ppd  Substance Use Topics  . Alcohol use: Yes    Alcohol/week: 2.0 standard drinks    Types: 2 Cans of beer per week    Subjective:  Patient presents with concerns for possible UTI; complaining of urinary urgency, hesitancy; does have atrophic vaginitis- currently using Replens; no blood in urine; no fever; not sexually active; no vaginal discharge.     Objective:  Vitals:   03/03/18 1430  BP: 118/60  Pulse: (!) 101  Temp: 97.9 F (36.6 C)  TempSrc: Oral  SpO2: 96%  Weight: 172 lb 0.6 oz (78 kg)  Height: 5\' 5"  (1.651 m)    General: Well developed, well nourished, in no acute distress  Skin : Warm and dry.  Head: Normocephalic and atraumatic  Lungs: Respirations unlabored; clear to auscultation bilaterally without wheeze, rales, rhonchi  CVS exam: normal rate and regular rhythm.  Musculoskeletal: No deformities; no active joint inflammation negative CVA tenderness;  Extremities: No edema, cyanosis, clubbing  Vessels: Symmetric bilaterally  Neurologic: Alert and oriented; speech intact; face symmetrical; moves all extremities well; CNII-XII intact without focal deficit   Assessment:  1. Dysuria     Plan:  Suspect UTI; check U/A and urine culture; Rx for Macrobid 100 mg bid x 7 days; use Replens as discussed; increase fluids, rest and follow-up worse, no better.   No follow-ups on file.  Orders Placed This Encounter  Procedures  . POCT Urinalysis Dipstick (Automated)    Requested  Prescriptions   Signed Prescriptions Disp Refills  . nitrofurantoin, macrocrystal-monohydrate, (MACROBID) 100 MG capsule 14 capsule 0    Sig: Take 1 capsule (100 mg total) by mouth 2 (two) times daily.

## 2018-03-04 ENCOUNTER — Ambulatory Visit: Payer: Medicare Other | Admitting: Internal Medicine

## 2018-03-05 DIAGNOSIS — H5213 Myopia, bilateral: Secondary | ICD-10-CM | POA: Diagnosis not present

## 2018-03-05 DIAGNOSIS — H1851 Endothelial corneal dystrophy: Secondary | ICD-10-CM | POA: Diagnosis not present

## 2018-03-05 DIAGNOSIS — H1859 Other hereditary corneal dystrophies: Secondary | ICD-10-CM | POA: Diagnosis not present

## 2018-03-05 DIAGNOSIS — H40053 Ocular hypertension, bilateral: Secondary | ICD-10-CM | POA: Diagnosis not present

## 2018-03-05 DIAGNOSIS — H53453 Other localized visual field defect, bilateral: Secondary | ICD-10-CM | POA: Diagnosis not present

## 2018-03-05 LAB — URINE CULTURE
MICRO NUMBER:: 91293568
SPECIMEN QUALITY:: ADEQUATE

## 2018-03-06 DIAGNOSIS — H903 Sensorineural hearing loss, bilateral: Secondary | ICD-10-CM | POA: Diagnosis not present

## 2018-03-06 DIAGNOSIS — H9313 Tinnitus, bilateral: Secondary | ICD-10-CM | POA: Diagnosis not present

## 2018-03-06 DIAGNOSIS — H6123 Impacted cerumen, bilateral: Secondary | ICD-10-CM | POA: Diagnosis not present

## 2018-03-12 ENCOUNTER — Telehealth: Payer: Self-pay | Admitting: Internal Medicine

## 2018-03-12 NOTE — Telephone Encounter (Signed)
Copied from [place] [id number]. Topic: General - Other >> Mar 12, 2018 12:43 PM [name] wrote: Reason for CRM: Patient is request a call back in regards to a bruise on ankle. She is on  letrozole [facility]) 2.5 MG tablet  and is consider  that one on symbols is blood clot, she stated it doesn't hurt she just wants to make sure. Please advise

## 2018-03-12 NOTE — Telephone Encounter (Signed)
Pt aware of response. Expressed understanding.  

## 2018-03-12 NOTE — Telephone Encounter (Signed)
If there is no pain or swelling it is unlikely a blood clot - probably just a bruise.

## 2018-03-12 NOTE — Telephone Encounter (Signed)
Any advise?

## 2018-03-17 NOTE — Progress Notes (Signed)
[facility] [employer] [place], [place] [address] Phone: [phone] Subjective:   [name], am serving as a scribe for Dr. Hulan Saas.   CC: Right shoulder pain follow-up  CWC:[id number]  [facility] is a 72 y.o. female coming in with complaint of right shoulder pain. She states that she has been going to PT for 45 min, two times a week. Has been doing her upper body HEP sparingly due to time. She notes pain while sleeping due to an increase in shoulder pain in posterior aspect. She is having a decrease in range of motion with horizontal abduction and IR.      Past Medical History:  Diagnosis Date  . Anxiety   . Atrophic vaginitis   . Breast cancer ([facility])   . 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 Breast cyst    x3  . DDD (degenerative disc disease)   . Diverticulosis   . Fasting hyperglycemia   . GERD (gastroesophageal reflux disease)   . Gilbert syndrome   . Glaucoma   . History of hiatal hernia   . HSV infection   . Hyperlipidemia   . Radiation    left breast 50.4 gray  . STD (sexually transmitted disease)    HSV   Past Surgical History:  Procedure Laterality Date  . APPENDECTOMY  1961  . BREAST CYST ASPIRATION     x2 right ,x1 left  . COLONOSCOPY  2007   neg  . DILATION AND CURETTAGE OF UTERUS  1975  . FLEXIBLE SIGMOIDOSCOPY     x2  . PELVIC LAPAROSCOPY    . RADIOACTIVE SEED GUIDED PARTIAL MASTECTOMY WITH AXILLARY SENTINEL LYMPH NODE BIOPSY Left 05/31/2015   Procedure: LEFT BREAST RADIOACTIVE SEED GUIDED LUMPECTOMY WITH LEFT AXILLARY SENTINEL LYMPH NODE BIOPSY;  Surgeon: [name], MD;  Location: [facility];  Service: General;  Laterality: Left;  . RE-EXCISION OF BREAST LUMPECTOMY Left 07/01/2015   Procedure: RE-EXCISION OF LEFT BREAST INFERIOR MARGIN;  Surgeon: [name], MD;  Location: [facility];  Service: General;  Laterality:  Left;  . UPPER GASTROINTESTINAL ENDOSCOPY    . UPPER GI ENDOSCOPY  10/2010   Dr [name]  . WISDOM TOOTH EXTRACTION  1978   Social History   Socioeconomic History  . Marital status: Divorced    Spouse name: Not on file  . Number of children: 0  . Years of education: Not on file  . Highest education level: Not on file  Occupational History  . Occupation: Environmental health practitioner for [employer]  . Financial resource strain: Not hard at all  . Food insecurity:    Worry: Never true    Inability: Never true  . Transportation needs:    Medical: No    Non-medical: No  Tobacco Use  . Smoking status: Former Smoker    Types: Cigarettes    Last attempt to quit: 03/28/1999    Years since quitting: 18.9  . Smokeless tobacco: Never Used  . Tobacco comment: smoked 1967-2000, up to 3/4 ppd  Substance and Sexual Activity  . Alcohol use: Yes    Alcohol/week: 2.0 standard drinks    Types: 2 Cans of beer per week  . Drug use: No  . Sexual activity: Never    Birth control/protection: Post-menopausal    Comment: 1st intercourse 72 yo-More than 5 partners  Lifestyle  . Physical activity:    Days per  week: 5 days    Minutes per session: 60 min  . Stress: Not at all  Relationships  . Social connections:    Talks on phone: More than three times a week    Gets together: More than three times a week    Attends religious service: More than 4 times per year    Active member of club or organization: Yes    Attends meetings of clubs or organizations: More than 4 times per year    Relationship status: Divorced  Other Topics Concern  . 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      Regular exercise-no   Allergies  Allergen Reactions  . Septra Ds [Sulfamethoxazole-Trimethoprim] Hives  . Arimidex [Anastrozole] Other (See Comments)    Unable to function  . Sulfa Antibiotics   . Adhesive [Tape] Rash   Family History  Problem  Relation Age of Onset  . Lung cancer Mother        smoker  . Endometriosis Mother   . Cancer Mother        Bladder cancer  . Stomach cancer Maternal Grandmother   . Coronary artery disease Father   . Cancer Father        intestinal carcinoid tumors  . Sudden death Sister        89 week old, congenital deformity  . Liver cancer Maternal Uncle   . Stroke Paternal Grandfather 110  . Leukemia Maternal Uncle   . Breast cancer Paternal Aunt        great paternal aunt-Age 41's  . Colon cancer Paternal Aunt        lived to 42; pat great aunt  . Vascular Disease Paternal Grandmother        thoracic aneurysm  . Esophageal cancer Neg Hx   . Rectal cancer Neg Hx   . Pancreatic cancer Neg Hx      Current Outpatient Medications (Cardiovascular):  .  rosuvastatin (CRESTOR) 40 MG tablet, TAKE 1 TABLET BY MOUTH ONCE DAILY   Current Outpatient Medications (Analgesics):  .  aspirin 81 MG tablet, Take 81 mg by mouth as directed.  [name]  ibuprofen (ADVIL,MOTRIN) 200 MG tablet, Take 200 mg by mouth every 6 (six) hours as needed.   Current Outpatient Medications (Other):  .  bimatoprost (LUMIGAN) 0.01 % SOLN, Place 1 drop into both eyes at bedtime. .  brinzolamide (AZOPT) 1 % ophthalmic suspension, 1 drop 2 (two) times daily. .  calcium carbonate (TUMS - DOSED IN MG ELEMENTAL CALCIUM) 500 MG chewable tablet, Chew 1 tablet by mouth as needed for indigestion or heartburn (1000 mg).  .  Cholecalciferol (VITAMIN D3 PO), Take 1 tablet by mouth daily. .  famotidine (PEPCID) 20 MG tablet, Take 1 tablet by mouth twice daily before breakfast and dinner. [name]  letrozole (FEMARA) 2.5 MG tablet, Take 1 tablet (2.5 mg total) by mouth daily. .  nitrofurantoin, macrocrystal-monohydrate, (MACROBID) 100 MG capsule, Take 1 capsule (100 mg total) by mouth 2 (two) times daily.    Past medical history, social, surgical and family history all reviewed in electronic medical record.  No pertanent information unless stated  regarding to the chief complaint.   Review of Systems:  No headache, visual changes, nausea, vomiting, diarrhea, constipation, dizziness, abdominal pain, skin rash, fevers, chills, night sweats, weight loss, swollen lymph nodes, body aches, joint swelling, muscle aches, chest pain, shortness of breath, mood changes.  Objective  Blood pressure 116/68, height 5\' 5"  (1.651 m), weight 174 lb (78.9 kg), SpO2 97 %.   General: No apparent distress alert and oriented x3 mood and affect normal, dressed appropriately.  HEENT: Pupils equal, extraocular movements intact  Respiratory: Patient's speak in full sentences and does not appear short of breath  Cardiovascular: No lower extremity edema, non tender, no erythema  Skin: Warm dry intact with no signs of infection or rash on extremities or on axial skeleton.  Abdomen: Soft nontender  Neuro: Cranial nerves II through XII are intact, neurovascularly intact in all extremities with 2+ DTRs and 2+ pulses.  Lymph: No lymphadenopathy of posterior or anterior cervical chain or axillae bilaterally.  Gait normal with good balance and coordination.  MSK:  Non tender with full range of motion and good stability and symmetric strength and tone of , elbows, wrist, hip, knee and ankles bilaterally.  Mild arthritic changes of multiple joints Right shoulder exam shows the patient does have some mild crepitus.  Patient does have some loss of internal and external range of motion especially internal range of motion to sacrum.  Forward flexion is near complete the rotator cuff is intact with 5 out of 5 strength though.  This is improved from previous exam    [id number]; 15 additional minutes spent for Therapeutic exercises as stated in above notes.  This included exercises focusing on stretching, strengthening, with significant focus on eccentric aspects.   Long term goals include an improvement in range of motion, strength, endurance as well as avoiding reinjury. Patient's  frequency would include in 1-2 times a day, 3-5 times a week for a duration of 6-12 weeks. Shoulder Exercises that included:  Basic scapular stabilization to include adduction and depression of scapula Scaption, focusing on proper movement and good control Internal and External rotation utilizing a theraband, with elbow tucked at side entire time Rows with theraband   Proper technique shown and discussed handout in great detail with ATC.  All questions were discussed and answered.     Impression and Recommendations:     \ The above documentation has been reviewed and is accurate and complete Lyndal Pulley, DO       Note: This dictation was prepared with Dragon dictation along with smaller phrase technology. Any transcriptional errors that result from this process are unintentional.

## 2018-03-18 ENCOUNTER — Ambulatory Visit (INDEPENDENT_AMBULATORY_CARE_PROVIDER_SITE_OTHER): Payer: Medicare Other | Admitting: Family Medicine

## 2018-03-18 ENCOUNTER — Ambulatory Visit: Payer: Self-pay

## 2018-03-18 VITALS — BP 116/68 | Ht 65.0 in | Wt 174.0 lb

## 2018-03-18 DIAGNOSIS — G8929 Other chronic pain: Secondary | ICD-10-CM

## 2018-03-18 DIAGNOSIS — M25511 Pain in right shoulder: Secondary | ICD-10-CM | POA: Diagnosis not present

## 2018-03-18 DIAGNOSIS — M7501 Adhesive capsulitis of right shoulder: Secondary | ICD-10-CM

## 2018-03-18 DIAGNOSIS — M7500 Adhesive capsulitis of unspecified shoulder: Secondary | ICD-10-CM | POA: Insufficient documentation

## 2018-03-18 NOTE — Patient Instructions (Signed)
Good to see you  Frozen shoulder Ice is your friend Stay active New exercises Try to increase range of motion  See me again in 4-6 weeks  If not better lets consider injection

## 2018-03-18 NOTE — Assessment & Plan Note (Signed)
Frozen shoulder.  Discussed icing regimen and home exercise, we discussed with patient that at this point we need to work on range of motion.  Likely some underlying arthritic changes that could be contributing as well.  May need advanced imaging in the long run but I think it is low likelihood.  Rotator cuff strength seems to be improved.  Encourage patient to start increasing activity slowly.  Follow-up with me again 4 weeks

## 2018-04-17 ENCOUNTER — Telehealth: Payer: Self-pay

## 2018-04-17 NOTE — Telephone Encounter (Signed)
Returned pt's call regarding she is about to run out of Letrozole and has called several pharmacies but can't get it in.  I called [facility] and they can get it in from [facility].  Called pt back and she said Letrozole must be kind that is manufactured by [employer] due to prior allergy with Anatrozole and is concerned some of the fillers will be the same as Anastrozole if she switches to a different manufactory of Letrozole.  Pt requested appt to discuss if she she should try other manufactories of Letrozole and risk feeling sick or are there other alternatives he can prescribe.  Appt made for 12/16 at 3:45 pm. No other needs per pt at this time.

## 2018-04-21 ENCOUNTER — Inpatient Hospital Stay: Payer: Medicare Other | Attending: Hematology and Oncology | Admitting: Hematology and Oncology

## 2018-04-21 DIAGNOSIS — Z923 Personal history of irradiation: Secondary | ICD-10-CM | POA: Insufficient documentation

## 2018-04-21 DIAGNOSIS — C50212 Malignant neoplasm of upper-inner quadrant of left female breast: Secondary | ICD-10-CM | POA: Insufficient documentation

## 2018-04-21 DIAGNOSIS — Z79811 Long term (current) use of aromatase inhibitors: Secondary | ICD-10-CM | POA: Insufficient documentation

## 2018-04-21 DIAGNOSIS — N951 Menopausal and female climacteric states: Secondary | ICD-10-CM | POA: Diagnosis not present

## 2018-04-21 DIAGNOSIS — Z79899 Other long term (current) drug therapy: Secondary | ICD-10-CM | POA: Diagnosis not present

## 2018-04-21 DIAGNOSIS — Z7982 Long term (current) use of aspirin: Secondary | ICD-10-CM | POA: Diagnosis not present

## 2018-04-21 DIAGNOSIS — Z170 Estrogen receptor positive status [ER+]: Secondary | ICD-10-CM | POA: Insufficient documentation

## 2018-04-21 NOTE — Progress Notes (Signed)
Patient Care Team: [name], MD as PCP - General (Internal Medicine) Phineas Real, Belinda Block, MD as Consulting Physician (Gynecology) Armbruster, Carlota Raspberry, MD as Consulting Physician (Gastroenterology) Calvert Cantor, MD as Consulting Physician (Ophthalmology)  DIAGNOSIS:  Encounter Diagnosis  Name Primary?  . Malignant neoplasm of upper-inner quadrant of left breast in female, estrogen receptor positive ([facility])     SUMMARY OF ONCOLOGIC HISTORY:   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[facility])   05/18/2015 Initial Diagnosis    Left breast biopsy: Invasive ductal carcinoma low to intermediate grade, ER 100%, PR 95%, HER-2 negative ratio 1.02, Ki-67 5%, left breast asymmetry 1.3 cm, T1c N0 stage IA    05/31/2015 Surgery    Left lumpectomy [name]): Invasive ductal carcinoma grade 2, 1.5 cm, 0/3 lymph nodes negative, broadly involves inferior margin, ER 100%, PR 95%, HER-2 negative duration 112, Ki-67 5%, T1 cN0 stage IA, Oncotype DX score 18, 12% ROR    05/31/2015 Oncotype testing    Oncotype DX score: 18/12% ROR    07/01/2015 Surgery    Reexcision of the left inferior margin: Fibrocystic changes with UDH    08/10/2015 - 09/16/2015 Radiation Therapy    Adj XRT ([facility]). 50.4 Gy in 28 fractions to the left breast    10/06/2015 -  Anti-estrogen oral therapy    Anastrozole 1 mg by mouth daily 5 years, switched to letrozole 11/09/2015 due to dizziness lightheadedness and fatigue     CHIEF COMPLIANT: Follow-up on letrozole therapy, patient unable to get supply from [facility]  INTERVAL HISTORY: TRENIYAH [facility] is a 72 year old with above-mentioned history of left breast cancer treated with lumpectomy radiation and is currently on oral antiestrogen therapy with letrozole.  Previous experiences pain with anastrozole which she could not tolerate it.  She has been tolerating letrozole extremely well which comes from [facility] [employer]  She is worried that she has not been  able to get this medication from that manufacturer and is concerned about adverse effects with any other manufacturer.  She is going to [place] for Christmas and wants to make sure that it is okay to stop letrozole for a week.  She does not want to start the new manufacturer while she is at [place].  REVIEW OF SYSTEMS:   Constitutional: Denies fevers, chills or abnormal weight loss Eyes: Denies blurriness of vision Ears, nose, mouth, throat, and face: Denies mucositis or sore throat Respiratory: Denies cough, dyspnea or wheezes Cardiovascular: Denies palpitation, chest discomfort Gastrointestinal:  Denies nausea, heartburn or change in bowel habits Skin: Denies abnormal skin rashes Lymphatics: Denies new lymphadenopathy or easy bruising Neurological:Denies numbness, tingling or new weaknesses Behavioral/Psych: Mood is stable, no new changes  Extremities: No lower extremity edema Breast:  denies any pain or lumps or nodules in either breasts All other systems were reviewed with the patient and are negative.  I have reviewed the past medical history, past surgical history, social history and family history with the patient and they are unchanged from previous note.  ALLERGIES:  is allergic to septra ds [sulfamethoxazole-trimethoprim]; arimidex [anastrozole]; sulfa antibiotics; and adhesive [tape].  MEDICATIONS:  Current Outpatient Medications  Medication Sig Dispense Refill  . aspirin 81 MG tablet Take 81 mg by mouth as directed.     . bimatoprost (LUMIGAN) 0.01 % SOLN Place 1 drop into both eyes at bedtime.    . brinzolamide (AZOPT) 1 % ophthalmic suspension 1 drop 2 (two) times daily.    . calcium carbonate (TUMS -  DOSED IN MG ELEMENTAL CALCIUM) 500 MG chewable tablet Chew 1 tablet by mouth as needed for indigestion or heartburn (1000 mg).     . Cholecalciferol (VITAMIN D3 PO) Take 1 tablet by mouth daily.    . famotidine (PEPCID) 20 MG tablet Take 1 tablet by mouth twice daily before  breakfast and dinner. 60 tablet 3  . ibuprofen (ADVIL,MOTRIN) 200 MG tablet Take 200 mg by mouth every 6 (six) hours as needed.    . letrozole (FEMARA) 2.5 MG tablet Take 1 tablet (2.5 mg total) by mouth daily. 90 tablet 3  . rosuvastatin (CRESTOR) 40 MG tablet TAKE 1 TABLET BY MOUTH ONCE DAILY 90 tablet 2   No current facility-administered medications for this visit.     PHYSICAL EXAMINATION: ECOG PERFORMANCE STATUS: 1 - Symptomatic but completely ambulatory  Vitals:   04/21/18 1540  BP: (!) 145/72  Pulse: 78  Resp: 17  Temp: (!) 97.4 F (36.3 C)  SpO2: 97%   Filed Weights   04/21/18 1540  Weight: 174 lb (78.9 kg)    GENERAL:alert, no distress and comfortable SKIN: skin color, texture, turgor are normal, no rashes or significant lesions EYES: normal, Conjunctiva are pink and non-injected, sclera clear OROPHARYNX:no exudate, no erythema and lips, buccal mucosa, and tongue normal  NECK: supple, thyroid normal size, non-tender, without nodularity LYMPH:  no palpable lymphadenopathy in the cervical, axillary or inguinal LUNGS: clear to auscultation and percussion with normal breathing effort HEART: regular rate & rhythm and no murmurs and no lower extremity edema ABDOMEN:abdomen soft, non-tender and normal bowel sounds MUSCULOSKELETAL:no cyanosis of digits and no clubbing  NEURO: alert & oriented x 3 with fluent speech, no focal motor/sensory deficits EXTREMITIES: No lower extremity edema   LABORATORY DATA:  I have reviewed the data as listed CMP Latest Ref Rng & Units 01/13/2018 01/04/2017 08/10/2015  Glucose 70 - 99 mg/dL 105(H) 99 100(H)  BUN 6 - 23 mg/dL 12 14 12  Creatinine 0.40 - 1.20 mg/dL 0.69 0.74 0.62  Sodium 135 - 145 mEq/L 141 141 140  Potassium 3.5 - 5.1 mEq/L 4.1 4.3 4.2  Chloride 96 - 112 mEq/L 105 104 103  CO2 19 - 32 mEq/L 28 29 32  Calcium 8.4 - 10.5 mg/dL 9.4 9.6 9.4  Total Protein 6.0 - 8.3 g/dL 7.0 6.8 7.3  Total Bilirubin 0.2 - 1.2 mg/dL 1.6(H) 1.4(H)  1.4(H)  Alkaline Phos 39 - 117 U/L 58 53 49  AST 0 - 37 U/L 21 24 25  ALT 0 - 35 U/L 20 20 22    Lab Results  Component Value Date   WBC 6.2 01/13/2018   HGB 13.9 01/13/2018   HCT 41.8 01/13/2018   MCV 85.3 01/13/2018   PLT 246.0 01/13/2018   NEUTROABS 3.1 01/13/2018    ASSESSMENT & PLAN:  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[facility]) Left lumpectomy 05/31/2015: Invasive ductal carcinoma grade 2, 1.5 cm, 0/3 lymph nodes negative, broadly involves inferior margin, ER 100%, PR 95%, HER-2 negative, Ki-67 5%, T1 cN0 stage IA, Oncotype DX score 18, 12% ROR, Reexcision of margins negative  Adjuvant radiation therapy with Dr. Kinard 08/10/15- 09/16/15  Current treatment: Anastrozole 1 mg daily started June1st 2017 stopped 10/17/2015 due to dizziness lightheadedness and fatigue, switched to letrozole starting 11/09/2015  Letrozole toxicities: 1.  Hot flashes Patient was getting letrozole from [facility] [facility] and was able to tolerate it well.  She was not able to find that supplier anymore.  I   discussed with her that she needs to try another manufacturer and see if she tolerates that.  Breast Cancer Surveillance: 1. Breast exam 09/09/2017: Mild lymphedema 2. Mammogram 05/16/2017 at [facility]: Benign  Return to clinic in May 2020 as previously scheduled.  No orders of the defined types were placed in this encounter.  The patient has a good understanding of the overall plan. she agrees with it. she will call with any problems that may develop before the next visit here.   Viinay K Gudena, MD 04/21/18    

## 2018-04-21 NOTE — Assessment & Plan Note (Signed)
Left lumpectomy 05/31/2015: Invasive ductal carcinoma grade 2, 1.5 cm, 0/3 lymph nodes negative, broadly involves inferior margin, ER 100%, PR 95%, HER-2 negative, Ki-67 5%, T1 cN0 stage IA, Oncotype DX score 18, 12% ROR, Reexcision of margins negative  Adjuvant radiation therapy with Dr. Sondra Come 08/10/15- 09/16/15  Current treatment: Anastrozole 1 mg daily started June1st 2017 stopped 10/17/2015 due to dizziness lightheadedness and fatigue, switched to letrozole starting 11/09/2015  Letrozole toxicities: 1.  Hot flashes Patient was getting letrozole from [facility] [facility] and was able to tolerate it well.  She was not able to find that supplier anymore.  I discussed with her that she needs to try another manufacturer and see if she tolerates that.  Breast Cancer Surveillance: 1. Breast exam 09/09/2017: Mild lymphedema 2. Mammogram 05/16/2017 at [facility]: Benign  Return to clinic in 1 year for follow-up

## 2018-04-21 NOTE — Progress Notes (Signed)
[name] [address] [place], [place] [address] Phone: [phone] Subjective:   [name], am serving as a scribe for Dr. Hulan Saas.   CC: Right shoulder pain follow-up  [id number]  Lindsey Barrett is a 72 y.o. female coming in with complaint of right shoulder pain. Patient states that she continues to have weakness and decreased ROM. Patient feels like her muscles are tight in the upper arm. Pain is intermittent. Has been unable to sleep well since last visit. Is not using any medications for pain. Having pain in end ranges.  Patient was initially seen for the rotator cuff tear but appeared to be improving with the formal physical therapy.  Then had more of a frozen shoulder.  Started to have worsening symptoms again.  Patient states that the range of motion did improve a little bit but still not fully.    Past Medical History:  Diagnosis Date  . Anxiety   . Atrophic vaginitis   . Breast cancer ([place])   . 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[place]) 05/20/2015  . Breast cyst    x3  . DDD (degenerative disc disease)   . Diverticulosis   . Fasting hyperglycemia   . GERD (gastroesophageal reflux disease)   . Gilbert syndrome   . Glaucoma   . History of hiatal hernia   . HSV infection   . Hyperlipidemia   . Radiation    left breast 50.4 gray  . STD (sexually transmitted disease)    HSV   Past Surgical History:  Procedure Laterality Date  . APPENDECTOMY  1961  . BREAST CYST ASPIRATION     x2 right ,x1 left  . COLONOSCOPY  2007   neg  . DILATION AND CURETTAGE OF UTERUS  1975  . FLEXIBLE SIGMOIDOSCOPY     x2  . PELVIC LAPAROSCOPY    . RADIOACTIVE SEED GUIDED PARTIAL MASTECTOMY WITH AXILLARY SENTINEL LYMPH NODE BIOPSY Left 05/31/2015   Procedure: LEFT BREAST RADIOACTIVE SEED GUIDED LUMPECTOMY WITH LEFT AXILLARY SENTINEL LYMPH NODE BIOPSY;  Surgeon: [name], MD;  Location: [facility];  Service: General;  Laterality: Left;  . RE-EXCISION OF BREAST LUMPECTOMY Left 07/01/2015   Procedure: RE-EXCISION OF LEFT BREAST INFERIOR MARGIN;  Surgeon: [name], MD;  Location: [facility];  Service: General;  Laterality: Left;  . UPPER GASTROINTESTINAL ENDOSCOPY    . UPPER GI ENDOSCOPY  10/2010   Dr [name]  . WISDOM TOOTH EXTRACTION  1978   Social History   Socioeconomic History  . Marital status: Divorced    Spouse name: Not on file  . Number of children: 0  . Years of education: Not on file  . Highest education level: Not on file  Occupational History  . Occupation: Environmental health practitioner for [employer]  . Financial resource strain: Not hard at all  . Food insecurity:    Worry: Never true    Inability: Never true  . Transportation needs:    Medical: No    Non-medical: No  Tobacco Use  . Smoking status: Former Smoker    Types: Cigarettes    Last attempt to quit: 03/28/1999    Years since quitting: 19.0  . Smokeless tobacco: Never Used  . Tobacco comment: smoked 1967-2000, up to 3/4 ppd  Substance and Sexual Activity  . Alcohol use: Yes    Alcohol/week: 2.0 standard drinks    Types: 2 Cans of beer  per week  . Drug use: No  . Sexual activity: Never    Birth control/protection: Post-menopausal    Comment: 1st intercourse 72 yo-More than 5 partners  Lifestyle  . Physical activity:    Days per week: 5 days    Minutes per session: 60 min  . Stress: Not at all  Relationships  . Social connections:    Talks on phone: More than three times a week    Gets together: More than three times a week    Attends religious service: More than 4 times per year    Active member of club or organization: Yes    Attends meetings of clubs or organizations: More than 4 times per year    Relationship status: Divorced  Other Topics Concern  . 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       Regular exercise-no   Allergies  Allergen Reactions  . Septra Ds [Sulfamethoxazole-Trimethoprim] Hives  . Arimidex [Anastrozole] Other (See Comments)    Unable to function  . Sulfa Antibiotics   . Adhesive [Tape] Rash   Family History  Problem Relation Age of Onset  . Lung cancer Mother        smoker  . Endometriosis Mother   . Cancer Mother        Bladder cancer  . Stomach cancer Maternal Grandmother   . Coronary artery disease Father   . Cancer Father        intestinal carcinoid tumors  . Sudden death Sister        43 week old, congenital deformity  . Liver cancer Maternal Uncle   . Stroke Paternal Grandfather 54  . Leukemia Maternal Uncle   . Breast cancer Paternal Aunt        great paternal aunt-Age 84's  . Colon cancer Paternal Aunt        lived to 41; pat great aunt  . Vascular Disease Paternal Grandmother        thoracic aneurysm  . Esophageal cancer Neg Hx   . Rectal cancer Neg Hx   . Pancreatic cancer Neg Hx      Current Outpatient Medications (Cardiovascular):  .  rosuvastatin (CRESTOR) 40 MG tablet, TAKE 1 TABLET BY MOUTH ONCE DAILY   Current Outpatient Medications (Analgesics):  .  aspirin 81 MG tablet, Take 81 mg by mouth as directed.  [name]  ibuprofen (ADVIL,MOTRIN) 200 MG tablet, Take 200 mg by mouth every 6 (six) hours as needed.   Current Outpatient Medications (Other):  .  bimatoprost (LUMIGAN) 0.01 % SOLN, Place 1 drop into both eyes at bedtime. .  brinzolamide (AZOPT) 1 % ophthalmic suspension, 1 drop 2 (two) times daily. .  calcium carbonate (TUMS - DOSED IN MG ELEMENTAL CALCIUM) 500 MG chewable tablet, Chew 1 tablet by mouth as needed for indigestion or heartburn (1000 mg).  .  Cholecalciferol (VITAMIN D3 PO), Take 1 tablet by mouth daily. .  famotidine (PEPCID) 20 MG tablet, Take 1 tablet by mouth twice daily before breakfast and dinner. [name]  letrozole (FEMARA) 2.5 MG tablet, Take 1 tablet (2.5 mg total) by mouth daily.    Past medical history,  social, surgical and family history all reviewed in electronic medical record.  No pertanent information unless stated regarding to the chief complaint.   Review of Systems:  No headache, visual changes, nausea, vomiting, diarrhea, constipation, dizziness, abdominal pain, skin rash, fevers, chills,  night sweats, weight loss, swollen lymph nodes, body aches, joint swelling, muscle aches, chest pain, shortness of breath, mood changes.   Objective  Blood pressure 132/76, pulse 78, height 5\' 5"  (1.651 m), weight 174 lb (78.9 kg), SpO2 97 %.     General: No apparent distress alert and oriented x3 mood and affect normal, dressed appropriately.  HEENT: Pupils equal, extraocular movements intact  Respiratory: Patient's speak in full sentences and does not appear short of breath  Cardiovascular: No lower extremity edema, non tender, no erythema  Skin: Warm dry intact with no signs of infection or rash on extremities or on axial skeleton.  Abdomen: Soft nontender  Neuro: Cranial nerves II through XII are intact, neurovascularly intact in all extremities with 2+ DTRs and 2+ pulses.  Lymph: No lymphadenopathy of posterior or anterior cervical chain or axillae bilaterally.  Gait normal with good balance and coordination.  MSK: Mild tender with full range of motion and good stability and symmetric strength and tone of  elbows, wrist, hip, knee and ankles bilaterally.  Right shoulder exam shows the patient does have some decreased range of motion in all planes especially external rotation and internal rotation to only lateral hip.  Patient does have mild crepitus with range of motion.  Rotator cuff strength does appear intact and at least 4+ out of 5 strength.  Patient does have mild positive O'Brien's.   Limited musculoskeletal ultrasound was performed and interpreted by Lyndal Pulley  Limited ultrasound shows the patient does have some calcific changes of the supraspinatus tendon.  Do not see any new  tearing appreciated.  Significant thickening of the anterior capsule noted here as well as at the subscapularis.  Patient does have a trace effusion of the glenohumeral joint noted Impression: Calcific changes of the supraspinatus difficult to assess for any new tear   Impression and Recommendations:     This case required medical decision making of moderate complexity. The above documentation has been reviewed and is accurate and complete Lyndal Pulley, DO       Note: This dictation was prepared with Dragon dictation along with smaller phrase technology. Any transcriptional errors that result from this process are unintentional.

## 2018-04-22 ENCOUNTER — Ambulatory Visit: Payer: Self-pay

## 2018-04-22 ENCOUNTER — Telehealth: Payer: Self-pay | Admitting: Hematology and Oncology

## 2018-04-22 ENCOUNTER — Encounter: Payer: Self-pay | Admitting: Family Medicine

## 2018-04-22 ENCOUNTER — Ambulatory Visit (INDEPENDENT_AMBULATORY_CARE_PROVIDER_SITE_OTHER)
Admission: RE | Admit: 2018-04-22 | Discharge: 2018-04-22 | Disposition: A | Discharge status: Home / Self Care | Payer: Medicare Other | Source: Ambulatory Visit | Attending: Family Medicine | Admitting: Family Medicine

## 2018-04-22 ENCOUNTER — Ambulatory Visit (INDEPENDENT_AMBULATORY_CARE_PROVIDER_SITE_OTHER): Payer: Medicare Other | Admitting: Family Medicine

## 2018-04-22 VITALS — BP 132/76 | HR 78 | Ht 65.0 in | Wt 174.0 lb

## 2018-04-22 DIAGNOSIS — G8929 Other chronic pain: Secondary | ICD-10-CM

## 2018-04-22 DIAGNOSIS — M25511 Pain in right shoulder: Secondary | ICD-10-CM

## 2018-04-22 DIAGNOSIS — M7501 Adhesive capsulitis of right shoulder: Secondary | ICD-10-CM | POA: Diagnosis not present

## 2018-04-22 DIAGNOSIS — M19011 Primary osteoarthritis, right shoulder: Secondary | ICD-10-CM | POA: Diagnosis not present

## 2018-04-22 NOTE — Assessment & Plan Note (Signed)
Worsening symptoms at this time.  Does have decreased range of motion in all planes.  Discussed with patient about restarting formal physical therapy.  Was there before for rotator cuff tear and now will start for more of a frozen shoulder.  We discussed the possibility of needing advanced imaging.  X-rays ordered today though.  Depending on how patient responds to the physical therapy patient will follow-up with me again in 4 to 6 weeks and for further evaluation.  Spent  25 minutes with patient face-to-face and had greater than 50% of counseling including as described above in assessment and plan.

## 2018-04-22 NOTE — Telephone Encounter (Signed)
No 12/16 los or referrals.

## 2018-04-22 NOTE — Patient Instructions (Signed)
God to see you  Continue with the ice Continue to move it  Xray downstairs Restart PT and lets see how it does See me again in 6-7 weeks and we will check in again

## 2018-05-12 ENCOUNTER — Other Ambulatory Visit: Payer: Self-pay

## 2018-05-12 ENCOUNTER — Ambulatory Visit: Payer: Medicare Other | Attending: Family Medicine | Admitting: Physical Therapy

## 2018-05-12 ENCOUNTER — Encounter: Payer: Self-pay | Admitting: Physical Therapy

## 2018-05-12 DIAGNOSIS — M25611 Stiffness of right shoulder, not elsewhere classified: Secondary | ICD-10-CM

## 2018-05-12 DIAGNOSIS — M25511 Pain in right shoulder: Secondary | ICD-10-CM | POA: Insufficient documentation

## 2018-05-12 DIAGNOSIS — R2232 Localized swelling, mass and lump, left upper limb: Secondary | ICD-10-CM | POA: Insufficient documentation

## 2018-05-12 NOTE — Therapy (Signed)
[facility] [address] [place], [place], [address] Phone: [phone]   Fax:  [phone]  Physical Therapy Evaluation  Patient Details  Name: Lindsey Barrett MRN: [id number] Date of Birth: 07-02-1945 Referring Provider (PT): Creig Hines   Encounter Date: 05/12/2018  PT End of Session - 05/12/18 1614    Visit Number  1    Date for PT Re-Evaluation  07/11/18    PT Start Time  0017    PT Stop Time  1615    PT Time Calculation (min)  45 min    Activity Tolerance  Patient tolerated treatment well    Behavior During Therapy  [facility] for tasks assessed/performed       Past Medical History:  Diagnosis Date  . Anxiety   . Atrophic vaginitis   . Breast cancer ([place])   . 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 Breast cyst    x3  . DDD (degenerative disc disease)   . Diverticulosis   . Fasting hyperglycemia   . GERD (gastroesophageal reflux disease)   . Gilbert syndrome   . Glaucoma   . History of hiatal hernia   . HSV infection   . Hyperlipidemia   . Radiation    left breast 50.4 gray  . STD (sexually transmitted disease)    HSV    Past Surgical History:  Procedure Laterality Date  . APPENDECTOMY  1961  . BREAST CYST ASPIRATION     x2 right ,x1 left  . COLONOSCOPY  2007   neg  . DILATION AND CURETTAGE OF UTERUS  1975  . FLEXIBLE SIGMOIDOSCOPY     x2  . PELVIC LAPAROSCOPY    . RADIOACTIVE SEED GUIDED PARTIAL MASTECTOMY WITH AXILLARY SENTINEL LYMPH NODE BIOPSY Left 05/31/2015   Procedure: LEFT BREAST RADIOACTIVE SEED GUIDED LUMPECTOMY WITH LEFT AXILLARY SENTINEL LYMPH NODE BIOPSY;  Surgeon: [name], MD;  Location: [facility];  Service: General;  Laterality: Left;  . RE-EXCISION OF BREAST LUMPECTOMY Left 07/01/2015   Procedure: RE-EXCISION OF LEFT BREAST INFERIOR MARGIN;  Surgeon: [name], MD;  Location: [facility];  Service:  General;  Laterality: Left;  . UPPER GASTROINTESTINAL ENDOSCOPY    . UPPER GI ENDOSCOPY  10/2010   Dr [name]  . WISDOM TOOTH EXTRACTION  1978    There were no vitals filed for this visit.   Subjective Assessment - 05/12/18 1527    Subjective  Patietn injured her right shoulder last year, RC injury, we saw her for 2 months and her ROM got back to normal, her pain was gone and had no issues with ADL's.  She reports that she did not do her exercises, reports by the end of November she was haivng right shoulder pain and difficulty with ADL's due to ROM.    Limitations  Lifting;House hold activities    Patient Stated Goals  have better motion, less difficulty with ADL's, sleep better    Currently in Pain?  Yes    Pain Score  3     Pain Location  Shoulder    Pain Orientation  Right;Upper    Pain Descriptors / Indicators  Aching;Tightness;Nagging    Pain Type  Acute pain    Pain Radiating Towards  has pain in the right forearm    Pain Onset  More than a month ago    Pain Frequency  Constant    Aggravating Factors  reaching, lifting pain up to 6-7/10    Pain Relieving Factors  rest helps pain can be a 1/10, ice    Effect of Pain on Daily Activities  difficulty sleeping,          [facility] PT Assessment - 05/12/18 0001      Assessment   Medical Diagnosis  right shoulder pain    Referring Provider (PT)  Z. Smith    Onset Date/Surgical Date  04/11/18    Hand Dominance  Right    Prior Therapy  last year      Precautions   Precautions  None      Balance Screen   Has the patient fallen in the past 6 months  No    Has the patient had a decrease in activity level because of a fear of falling?   No    Is the patient reluctant to leave their home because of a fear of falling?   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[employer]   no exercise      Posture/Postural Control   Posture Comments  fwd head, rounded shoulders      ROM / Strength   AROM / PROM / Strength  AROM;PROM;Strength      AROM   AROM Assessment Site  Shoulder    Right/Left Shoulder  Right    Right Shoulder Flexion  118 Degrees    Right Shoulder ABduction  118 Degrees    Right Shoulder Internal Rotation  20 Degrees    Right Shoulder External Rotation  80 Degrees      PROM   PROM Assessment Site  Shoulder    Right/Left Shoulder  Right    Right Shoulder Flexion  129 Degrees    Right Shoulder ABduction  131 Degrees    Right Shoulder Internal Rotation  30 Degrees    Right Shoulder External Rotation  90 Degrees      Strength   Overall Strength Comments  4-/5 for ER with some pain, flexion 4/5 with pain, IR 4/5      Palpation   Palpation comment  mild pain in the lateral right shoulder      Special Tests   Other special tests  + impingement, negative empty can                 Objective measurements completed on examination: See above findings.              PT Education - 05/12/18 1613    Education Details  HEP for AARO/PROM flexion and IR    Person(s) Educated  Patient    Methods  Explanation;Demonstration;Tactile cues;Handout;Verbal cues    Comprehension  Verbalized understanding;Returned demonstration       PT Short Term Goals - 05/12/18 1649      PT SHORT TERM GOAL #1   Title  I with HEP    Time  2    Period  Weeks    Status  New        PT Long Term Goals - 05/12/18 1649      PT LONG TERM GOAL #1   Title  Patient able to don/doff shirt without bending over.    Time  8    Period  Weeks    Status  New      PT LONG TERM GOAL #2   Title  Patient able to perform ADLS without limitation in ROM    Time  8    Period  Weeks    Status  New      PT LONG TERM GOAL #3   Title  Patient able to perform ADLs with pain of 2/10 or less in R shoulder.    Time  8    Period  Weeks    Status  New      PT  LONG TERM GOAL #4   Title  increase flexion to 145 degrees    Time  8    Period  Weeks    Status  New      PT LONG TERM GOAL #5   Title  increase IR to 65 degrees    Time  8    Period  Weeks    Status  New             Plan - 05/12/18 1614    Clinical Impression Statement  Patient was seen last year for right shoulder issues.  She improved greatly with ROM and function with no pain.  She reports that she stopped all exercises and noticed that in eraly December she was having difficulty with dressing, reaching behind her and was haivng some pain.  She has decreased right shoulder fleixon, abduction and especially IR.  Has positive impingement test, strong MMT in neutral positions and negative empty can test    Clinical Presentation  Stable    Clinical Decision Making  Low    Rehab Potential  Good    PT Frequency  2x / week    PT Duration  8 weeks    PT Treatment/Interventions  ADLs/Self Care Home Management;Cryotherapy;Electrical Stimulation;Iontophoresis 4mg /ml Dexamethasone;Moist Heat;Ultrasound;Therapeutic activities;Therapeutic exercise;Neuromuscular re-education;Patient/family education;Manual techniques;Dry needling    PT Next Visit Plan  start exercises    Consulted and Agree with Plan of Care  Patient       Patient will benefit from skilled therapeutic intervention in order to improve the following deficits and impairments:  Pain, Increased muscle spasms, Postural dysfunction, Impaired UE functional use, Decreased strength, Decreased range of motion  Visit Diagnosis: Acute pain of right shoulder - Plan: PT plan of care cert/re-cert  Stiffness of right shoulder, not elsewhere classified - Plan: PT plan of care cert/re-cert  Localized swelling, mass and lump, left upper limb - Plan: PT plan of care cert/re-cert     Problem List Patient Active Problem List   Diagnosis Date Noted  . 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 Epistaxis 02/05/2018  . Right rotator cuff tear  10/15/2017  . Axillary lymphadenopathy 02/26/2017  . Tinnitus of both ears 01/01/2017  . Reaction to internal stress 05/29/2016  . Prediabetes 08/10/2015  . 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[place] [facility]) 05/20/2015  . Open-angle glaucoma 06/17/2014  . Vitamin D deficiency 10/18/2011  . HSV infection   . Atrophic vaginitis   . VARICOSE VEINS, LOWER EXTREMITIES 09/28/2009  . GERD 07/14/2009  . Hyperlipidemia 12/26/2006  . GILBERT'S SYNDROME 08/09/2006  . DEGENERATIVE DISC DISEASE 08/09/2006    [name], PT 05/12/2018, 4:51 PM  [address] [place], [place], [address] Phone: [phone]   Fax:  [phone]  Name: Lindsey Barrett MRN: [id number] Date of Birth: [birth year]

## 2018-05-16 ENCOUNTER — Encounter: Payer: Self-pay | Admitting: Physical Therapy

## 2018-05-16 ENCOUNTER — Ambulatory Visit: Payer: Medicare Other | Admitting: Physical Therapy

## 2018-05-16 DIAGNOSIS — M25511 Pain in right shoulder: Secondary | ICD-10-CM | POA: Diagnosis not present

## 2018-05-16 DIAGNOSIS — R2232 Localized swelling, mass and lump, left upper limb: Secondary | ICD-10-CM | POA: Diagnosis not present

## 2018-05-16 DIAGNOSIS — M25611 Stiffness of right shoulder, not elsewhere classified: Secondary | ICD-10-CM | POA: Diagnosis not present

## 2018-05-16 NOTE — Therapy (Signed)
[facility] [place], [place], [address] Phone: [phone]   Fax:  [phone]  Physical Therapy Treatment  Patient Details  Name: Lindsey Barrett MRN: [id number] Date of Birth: 01/06/1946 Referring Provider (PT): Creig Hines   Encounter Date: 05/16/2018  PT End of Session - 05/16/18 0834    Visit Number  2    Date for PT Re-Evaluation  07/11/18    PT Start Time  0750    PT Stop Time  0848    PT Time Calculation (min)  58 min    Activity Tolerance  Patient tolerated treatment well    Behavior During Therapy  [facility] for tasks assessed/performed       Past Medical History:  Diagnosis Date  . Anxiety   . Atrophic vaginitis   . Breast cancer ([place])   . 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[place]) 05/20/2015  . Breast cyst    x3  . DDD (degenerative disc disease)   . Diverticulosis   . Fasting hyperglycemia   . GERD (gastroesophageal reflux disease)   . Gilbert syndrome   . Glaucoma   . History of hiatal hernia   . HSV infection   . Hyperlipidemia   . Radiation    left breast 50.4 gray  . STD (sexually transmitted disease)    HSV    Past Surgical History:  Procedure Laterality Date  . APPENDECTOMY  1961  . BREAST CYST ASPIRATION     x2 right ,x1 left  . COLONOSCOPY  2007   neg  . DILATION AND CURETTAGE OF UTERUS  1975  . FLEXIBLE SIGMOIDOSCOPY     x2  . PELVIC LAPAROSCOPY    . RADIOACTIVE SEED GUIDED PARTIAL MASTECTOMY WITH AXILLARY SENTINEL LYMPH NODE BIOPSY Left 05/31/2015   Procedure: LEFT BREAST RADIOACTIVE SEED GUIDED LUMPECTOMY WITH LEFT AXILLARY SENTINEL LYMPH NODE BIOPSY;  Surgeon: [name], MD;  Location: [facility];  Service: General;  Laterality: Left;  . RE-EXCISION OF BREAST LUMPECTOMY Left 07/01/2015   Procedure: RE-EXCISION OF LEFT BREAST INFERIOR MARGIN;  Surgeon: [name], MD;  Location: [facility];  Service:  General;  Laterality: Left;  . UPPER GASTROINTESTINAL ENDOSCOPY    . UPPER GI ENDOSCOPY  10/2010   Dr [name]  . WISDOM TOOTH EXTRACTION  1978    There were no vitals filed for this visit.  Subjective Assessment - 05/16/18 0752    Subjective  Patient reports that she is tight and stiff, difficulty dressing and doing hair    Currently in Pain?  Yes    Pain Score  3     Pain Location  Shoulder    Pain Orientation  Right                       [facility] Adult PT Treatment/Exercise - 05/16/18 0001      Exercises   Exercises  Shoulder      Shoulder Exercises: Standing   External Rotation  20 reps;Theraband    Theraband Level (Shoulder External Rotation)  Level 1 (Yellow)    Internal Rotation  20 reps;Theraband    Theraband Level (Shoulder Internal Rotation)  Level 1 (Yellow)    Extension  20 reps;Theraband    Theraband Level (Shoulder Extension)  Level 2 (Red)    Other Standing Exercises  ball pass around waist oth directions working on IR      Shoulder Exercises:  ROM/Strengthening   UBE (Upper Arm Bike)  Level 1 x 5 minutes    Lat Pull  2 plate;20 reps    Cybex Press  1 plate;20 reps    Cybex Row  2 plate;20 reps    Wall Wash  flexion, circles    "W" Arms  20 reps      Shoulder Exercises: Stretch   Corner Stretch  5 reps;10 seconds      Modalities   Modalities  Electrical Stimulation;Moist Heat      Moist Heat Therapy   Number Minutes Moist Heat  15 Minutes    Moist Heat Location  Shoulder      Electrical Stimulation   Electrical Stimulation Location  right shoulder    Electrical Stimulation Action  IFC    Electrical Stimulation Parameters  supine    Electrical Stimulation Goals  Pain      Manual Therapy   Manual Therapy  Passive ROM    Passive ROM  patient supine, PROM all GH motions to end range some increased focus on IR               PT Short Term Goals - 05/16/18 0175      PT SHORT TERM GOAL #1   Title  I with HEP    Status  On-going         PT Long Term Goals - 05/12/18 1649      PT LONG TERM GOAL #1   Title  Patient able to don/doff shirt without bending over.    Time  8    Period  Weeks    Status  New      PT LONG TERM GOAL #2   Title  Patient able to perform ADLS without limitation in ROM    Time  8    Period  Weeks    Status  New      PT LONG TERM GOAL #3   Title  Patient able to perform ADLs with pain of 2/10 or less in R shoulder.    Time  8    Period  Weeks    Status  New      PT LONG TERM GOAL #4   Title  increase flexion to 145 degrees    Time  8    Period  Weeks    Status  New      PT LONG TERM GOAL #5   Title  increase IR to 65 degrees    Time  8    Period  Weeks    Status  New            Plan - 05/16/18 1025    Clinical Impression Statement  Patient is very tight and painful with IR stretches.  She tolerates the stretches fair but really needs verbal and tactile cues to get a good stretch as she will lean forward or roll to alleviate the pain and stretch    PT Next Visit Plan  work on ROM    Consulted and Agree with Plan of Care  Patient       Patient will benefit from skilled therapeutic intervention in order to improve the following deficits and impairments:  Pain, Increased muscle spasms, Postural dysfunction, Impaired UE functional use, Decreased strength, Decreased range of motion  Visit Diagnosis: Acute pain of right shoulder  Stiffness of right shoulder, not elsewhere classified  Localized swelling, mass and lump, left upper limb     Problem List Patient Active  Problem List   Diagnosis Date Noted  . 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 Epistaxis 02/05/2018  . Right rotator cuff tear 10/15/2017  . Axillary lymphadenopathy 02/26/2017  . Tinnitus of both ears 01/01/2017  . Reaction to internal stress 05/29/2016  . Prediabetes 08/10/2015  . 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[place]) 05/20/2015  . Open-angle glaucoma 06/17/2014  . Vitamin D  deficiency 10/18/2011  . HSV infection   . Atrophic vaginitis   . VARICOSE VEINS, LOWER EXTREMITIES 09/28/2009  . GERD 07/14/2009  . Hyperlipidemia 12/26/2006  . GILBERT'S SYNDROME 08/09/2006  . DEGENERATIVE DISC DISEASE 08/09/2006    [name], PT 05/16/2018, 8:36 AM  [facility] [address] [place], [place], [address] Phone: [phone]   Fax:  [phone]  Name: Lindsey Barrett MRN: [id number] Date of Birth: 1945/07/14

## 2018-05-20 ENCOUNTER — Encounter: Payer: Medicare Other | Admitting: Physical Therapy

## 2018-05-21 ENCOUNTER — Ambulatory Visit: Payer: Medicare Other | Admitting: Physical Therapy

## 2018-05-21 ENCOUNTER — Encounter: Payer: Self-pay | Admitting: Physical Therapy

## 2018-05-21 DIAGNOSIS — R2232 Localized swelling, mass and lump, left upper limb: Secondary | ICD-10-CM

## 2018-05-21 DIAGNOSIS — M25511 Pain in right shoulder: Secondary | ICD-10-CM

## 2018-05-21 DIAGNOSIS — M25611 Stiffness of right shoulder, not elsewhere classified: Secondary | ICD-10-CM

## 2018-05-21 NOTE — Therapy (Signed)
[place], [place], [address] Phone: [phone]   Fax:  [phone]  Physical Therapy Treatment  Patient Details  Name: Lindsey Barrett MRN: [id number] Date of Birth: [birth year] Referring Provider (PT): Creig Hines   Encounter Date: 05/21/2018  PT End of Session - 05/21/18 0838    Visit Number  3    Date for PT Re-Evaluation  07/11/18    PT Start Time  0751    PT Stop Time  0848    PT Time Calculation (min)  57 min    Activity Tolerance  Patient tolerated treatment well    Behavior During Therapy  [facility] for tasks assessed/performed       Past Medical History:  Diagnosis Date  . Anxiety   . Atrophic vaginitis   . Breast cancer ([place])   . 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[place]) 05/20/2015  . Breast cyst    x3  . DDD (degenerative disc disease)   . Diverticulosis   . Fasting hyperglycemia   . GERD (gastroesophageal reflux disease)   . Gilbert syndrome   . Glaucoma   . History of hiatal hernia   . HSV infection   . Hyperlipidemia   . Radiation    left breast 50.4 gray  . STD (sexually transmitted disease)    HSV    Past Surgical History:  Procedure Laterality Date  . APPENDECTOMY  1961  . BREAST CYST ASPIRATION     x2 right ,x1 left  . COLONOSCOPY  2007   neg  . DILATION AND CURETTAGE OF UTERUS  1975  . FLEXIBLE SIGMOIDOSCOPY     x2  . PELVIC LAPAROSCOPY    . RADIOACTIVE SEED GUIDED PARTIAL MASTECTOMY WITH AXILLARY SENTINEL LYMPH NODE BIOPSY Left 05/31/2015   Procedure: LEFT BREAST RADIOACTIVE SEED GUIDED LUMPECTOMY WITH LEFT AXILLARY SENTINEL LYMPH NODE BIOPSY;  Surgeon: [name], MD;  Location: [place];  Service: General;  Laterality: Left;  . RE-EXCISION OF BREAST LUMPECTOMY Left 07/01/2015   Procedure: RE-EXCISION OF LEFT BREAST INFERIOR MARGIN;  Surgeon: [name], MD;  Location: [place];  Service:  General;  Laterality: Left;  . UPPER GASTROINTESTINAL ENDOSCOPY    . UPPER GI ENDOSCOPY  10/2010   Dr [name]  . WISDOM TOOTH EXTRACTION  1978    There were no vitals filed for this visit.  Subjective Assessment - 05/21/18 0755    Subjective  Patient reports after our last treatment seemed to really helped as she reports she had less difficulty with taking shirt off.  Reports that the last few days this again became difficult and started having the ache in the arm again    Currently in Pain?  Yes    Pain Score  3     Pain Location  Shoulder    Pain Orientation  Right;Lateral;Upper    Pain Descriptors / Indicators  Aching    Aggravating Factors   taking off shirt, sleeping                       [facility] Adult PT Treatment/Exercise - 05/21/18 0001      Shoulder Exercises: Standing   External Rotation  20 reps;Theraband    Theraband Level (Shoulder External Rotation)  Level 2 (Red)    Internal Rotation  20 reps;Theraband    Theraband Level (Shoulder Internal Rotation)  Level 2 (Red)    Extension  20 reps;Theraband    Theraband Level (Shoulder Extension)  Level 2 (Red)      Shoulder Exercises: ROM/Strengthening   UBE (Upper Arm Bike)  Level 2 x 5 minutes    Lat Pull  2 plate;20 reps    Cybex Press  1 plate;20 reps    Cybex Row  2 plate;20 reps    Wall Wash  flexion, circles    "W" Arms  20 reps      Shoulder Exercises: Stretch   Corner Stretch  5 reps;10 seconds      Modalities   Modalities  Electrical Stimulation;Moist Heat      Moist Heat Therapy   Number Minutes Moist Heat  15 Minutes    Moist Heat Location  Shoulder      Electrical Stimulation   Electrical Stimulation Location  right shoulder    Electrical Stimulation Action  IFC    Electrical Stimulation Parameters  supine    Electrical Stimulation Goals  Pain      Manual Therapy   Manual Therapy  Passive ROM    Passive ROM  patient supine, PROM all GH motions to end range some increased focus on IR                PT Short Term Goals - 05/16/18 4580      PT SHORT TERM GOAL #1   Title  I with HEP    Status  On-going        PT Long Term Goals - 05/12/18 1649      PT LONG TERM GOAL #1   Title  Patient able to don/doff shirt without bending over.    Time  8    Period  Weeks    Status  New      PT LONG TERM GOAL #2   Title  Patient able to perform ADLS without limitation in ROM    Time  8    Period  Weeks    Status  New      PT LONG TERM GOAL #3   Title  Patient able to perform ADLs with pain of 2/10 or less in R shoulder.    Time  8    Period  Weeks    Status  New      PT LONG TERM GOAL #4   Title  increase flexion to 145 degrees    Time  8    Period  Weeks    Status  New      PT LONG TERM GOAL #5   Title  increase IR to 65 degrees    Time  8    Period  Weeks    Status  New            Plan - 05/21/18 9983    Clinical Impression Statement  Patient continues to have tightness in the shoulder, tight especially anterior capsule.  She reports better taking shirt off for a few days after last session    PT Next Visit Plan  work on ROM    Consulted and Agree with Plan of Care  Patient       Patient will benefit from skilled therapeutic intervention in order to improve the following deficits and impairments:  Pain, Increased muscle spasms, Postural dysfunction, Impaired UE functional use, Decreased strength, Decreased range of motion  Visit Diagnosis: Acute pain of right shoulder  Stiffness of right shoulder, not elsewhere classified  Localized swelling, mass and lump, left upper limb  Problem List Patient Active Problem List   Diagnosis Date Noted  . 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 Epistaxis 02/05/2018  . Right rotator cuff tear 10/15/2017  . Axillary lymphadenopathy 02/26/2017  . Tinnitus of both ears 01/01/2017  . Reaction to internal stress 05/29/2016  . Prediabetes 08/10/2015  . Breast cancer of upper-inner quadrant of left female  breast ([place]) 05/20/2015  . Open-angle glaucoma 06/17/2014  . Vitamin D deficiency 10/18/2011  . HSV infection   . Atrophic vaginitis   . VARICOSE VEINS, LOWER EXTREMITIES 09/28/2009  . GERD 07/14/2009  . Hyperlipidemia 12/26/2006  . GILBERT'S SYNDROME 08/09/2006  . DEGENERATIVE DISC DISEASE 08/09/2006    [name], PT 05/21/2018, 8:46 AM  [place] [address] [place], [place], [address] Phone: [phone]   Fax:  [phone]  Name: Lindsey Barrett MRN: [id number] Date of Birth: 08-12-1945

## 2018-05-23 ENCOUNTER — Encounter: Payer: Self-pay | Admitting: Physical Therapy

## 2018-05-23 ENCOUNTER — Ambulatory Visit: Payer: Medicare Other | Admitting: Physical Therapy

## 2018-05-23 DIAGNOSIS — M25511 Pain in right shoulder: Secondary | ICD-10-CM

## 2018-05-23 DIAGNOSIS — R2232 Localized swelling, mass and lump, left upper limb: Secondary | ICD-10-CM

## 2018-05-23 DIAGNOSIS — M25611 Stiffness of right shoulder, not elsewhere classified: Secondary | ICD-10-CM

## 2018-05-23 NOTE — Therapy (Signed)
[place], [place], [address] Phone: [phone]   Fax:  [phone]  Physical Therapy Treatment  Patient Details  Name: Lindsey Barrett MRN: [id number] Date of Birth: 1945/05/16 Referring Provider (PT): Creig Hines   Encounter Date: 05/23/2018  PT End of Session - 05/23/18 1013    Visit Number  4    Date for PT Re-Evaluation  07/11/18    PT Start Time  0928    PT Stop Time  1026    PT Time Calculation (min)  58 min    Activity Tolerance  Patient tolerated treatment well    Behavior During Therapy  [facility] for tasks assessed/performed       Past Medical History:  Diagnosis Date  . Anxiety   . Atrophic vaginitis   . Breast cancer ([place])   . 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[place]) 05/20/2015  . Breast cyst    x3  . DDD (degenerative disc disease)   . Diverticulosis   . Fasting hyperglycemia   . GERD (gastroesophageal reflux disease)   . Gilbert syndrome   . Glaucoma   . History of hiatal hernia   . HSV infection   . Hyperlipidemia   . Radiation    left breast 50.4 gray  . STD (sexually transmitted disease)    HSV    Past Surgical History:  Procedure Laterality Date  . APPENDECTOMY  1961  . BREAST CYST ASPIRATION     x2 right ,x1 left  . COLONOSCOPY  2007   neg  . DILATION AND CURETTAGE OF UTERUS  1975  . FLEXIBLE SIGMOIDOSCOPY     x2  . PELVIC LAPAROSCOPY    . RADIOACTIVE SEED GUIDED PARTIAL MASTECTOMY WITH AXILLARY SENTINEL LYMPH NODE BIOPSY Left 05/31/2015   Procedure: LEFT BREAST RADIOACTIVE SEED GUIDED LUMPECTOMY WITH LEFT AXILLARY SENTINEL LYMPH NODE BIOPSY;  Surgeon: [name], MD;  Location: [place];  Service: General;  Laterality: Left;  . RE-EXCISION OF BREAST LUMPECTOMY Left 07/01/2015   Procedure: RE-EXCISION OF LEFT BREAST INFERIOR MARGIN;  Surgeon: [name], MD;  Location: [place];  Service:  General;  Laterality: Left;  . UPPER GASTROINTESTINAL ENDOSCOPY    . UPPER GI ENDOSCOPY  10/2010   Dr [name]  . WISDOM TOOTH EXTRACTION  1978    There were no vitals filed for this visit.  Subjective Assessment - 05/23/18 0928    Subjective  Patient reports that she is doing okay, feeling like she has some better ROM and functional.    Currently in Pain?  Yes    Pain Score  2     Pain Location  Shoulder    Pain Orientation  Right;Lateral         [facility] PT Assessment - 05/23/18 0001      AROM   Right Shoulder Flexion  123 Degrees    Right Shoulder Internal Rotation  30 Degrees                   [facility] Adult PT Treatment/Exercise - 05/23/18 0001      Shoulder Exercises: Seated   Other Seated Exercises  4# bent over, 2# extension, 1# reverse flies      Shoulder Exercises: Standing   External Rotation  20 reps;Theraband    Theraband Level (Shoulder External Rotation)  Level 2 (Red)    Internal Rotation  20 reps;Theraband    Theraband Level (  Shoulder Internal Rotation)  Level 2 (Red)    Extension  20 reps;Theraband    Theraband Level (Shoulder Extension)  Level 2 (Red)    Other Standing Exercises  ball pass around waist oth directions working on IR      Shoulder Exercises: ROM/Strengthening   UBE (Upper Arm Bike)  Level 3 x 5 minutes    Lat Pull  2 plate;20 reps    Cybex Press  1 plate;20 reps    Cybex Row  2.5 plate;20 reps    Wall Wash  flexion, circles added scaption    "W" Arms  20 reps PT overpressure for increased ROM      Modalities   Modalities  Electrical Stimulation;Moist Heat      Moist Heat Therapy   Number Minutes Moist Heat  15 Minutes    Moist Heat Location  Shoulder      Electrical Stimulation   Electrical Stimulation Location  right shoulder    Electrical Stimulation Action  IFC    Electrical Stimulation Parameters  supine    Electrical Stimulation Goals  Pain      Manual Therapy   Manual Therapy  Passive ROM    Passive ROM  patient  supine, PROM all GH motions to end range some increased focus on IR, all  with some contract relax               PT Short Term Goals - 05/23/18 1014      PT SHORT TERM GOAL #1   Title  I with HEP    Status  Achieved        PT Long Term Goals - 05/12/18 1649      PT LONG TERM GOAL #1   Title  Patient able to don/doff shirt without bending over.    Time  8    Period  Weeks    Status  New      PT LONG TERM GOAL #2   Title  Patient able to perform ADLS without limitation in ROM    Time  8    Period  Weeks    Status  New      PT LONG TERM GOAL #3   Title  Patient able to perform ADLs with pain of 2/10 or less in R shoulder.    Time  8    Period  Weeks    Status  New      PT LONG TERM GOAL #4   Title  increase flexion to 145 degrees    Time  8    Period  Weeks    Status  New      PT LONG TERM GOAL #5   Title  increase IR to 65 degrees    Time  8    Period  Weeks    Status  New            Plan - 05/23/18 1013    Clinical Impression Statement  Patient reports that the shoulder is moving better with her dressing, she is still very tight in the shoulder with IR and abduction.  She requires cues for form during exercises as seh needs better scapular retraction and tends to elevate shoulders and have elbow away from her side    PT Next Visit Plan  work on ROM    Consulted and Agree with Plan of Care  Patient       Patient will benefit from skilled therapeutic intervention in order to improve the  following deficits and impairments:  Pain, Increased muscle spasms, Postural dysfunction, Impaired UE functional use, Decreased strength, Decreased range of motion  Visit Diagnosis: Acute pain of right shoulder  Stiffness of right shoulder, not elsewhere classified  Localized swelling, mass and lump, left upper limb     Problem List Patient Active Problem List   Diagnosis Date Noted  . 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 Epistaxis 02/05/2018  . Right rotator  cuff tear 10/15/2017  . Axillary lymphadenopathy 02/26/2017  . Tinnitus of both ears 01/01/2017  . Reaction to internal stress 05/29/2016  . Prediabetes 08/10/2015  . 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[place]) 05/20/2015  . Open-angle glaucoma 06/17/2014  . Vitamin D deficiency 10/18/2011  . HSV infection   . Atrophic vaginitis   . VARICOSE VEINS, LOWER EXTREMITIES 09/28/2009  . GERD 07/14/2009  . Hyperlipidemia 12/26/2006  . GILBERT'S SYNDROME 08/09/2006  . DEGENERATIVE DISC DISEASE 08/09/2006    [name], PT 05/23/2018, 10:26 AM  [facility] [address] [place], [place], [address] Phone: [phone]   Fax:  [phone]  Name: Lindsey Barrett MRN: [id number] Date of Birth: 04-10-1946

## 2018-05-26 ENCOUNTER — Telehealth: Payer: Self-pay

## 2018-05-26 NOTE — Telephone Encounter (Signed)
Copied from [place]. Topic: General - Other >> May 26, 2018 10:49 AM Lennox Solders wrote: Reason for CRM: pt is calling and last 2 office visit with outpatient PT in visit diagnosis it states localized swelling mass and lump left upper limb. Pt had breast cancer 2 years ago and was not told of that diagnosis. Pt would like that sentence removed from her records

## 2018-05-26 NOTE — Telephone Encounter (Signed)
Copied from [place]. Topic: General - Other >> May 26, 2018 10:49 AM Lennox Solders wrote: Reason for CRM: pt is calling and last 2 office visit with outpatient PT in visit diagnosis it states localized swelling mass and lump left upper limb. Pt had breast cancer 2 years ago and was not told of that diagnosis. Pt would like that sentence removed from her records

## 2018-05-27 ENCOUNTER — Encounter: Payer: Self-pay | Admitting: Physical Therapy

## 2018-05-27 ENCOUNTER — Ambulatory Visit: Payer: Medicare Other | Admitting: Physical Therapy

## 2018-05-27 DIAGNOSIS — M25611 Stiffness of right shoulder, not elsewhere classified: Secondary | ICD-10-CM

## 2018-05-27 DIAGNOSIS — R2232 Localized swelling, mass and lump, left upper limb: Secondary | ICD-10-CM | POA: Diagnosis not present

## 2018-05-27 DIAGNOSIS — M25511 Pain in right shoulder: Secondary | ICD-10-CM | POA: Diagnosis not present

## 2018-05-27 NOTE — Therapy (Signed)
[place], [place], [address] Phone: [phone]   Fax:  [phone]  Physical Therapy Treatment  Patient Details  Name: Lindsey Barrett MRN: [id number] Date of Birth: 15-Oct-1945 Referring Provider (PT): Creig Hines   Encounter Date: 05/27/2018  PT End of Session - 05/27/18 1102    Visit Number  5    Date for PT Re-Evaluation  07/11/18    PT Start Time  0927    PT Stop Time  1022    PT Time Calculation (min)  55 min    Activity Tolerance  Patient tolerated treatment well    Behavior During Therapy  [facility] for tasks assessed/performed       Past Medical History:  Diagnosis Date  . Anxiety   . Atrophic vaginitis   . Breast cancer ([place])   . 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[place]) 05/20/2015  . Breast cyst    x3  . DDD (degenerative disc disease)   . Diverticulosis   . Fasting hyperglycemia   . GERD (gastroesophageal reflux disease)   . Gilbert syndrome   . Glaucoma   . History of hiatal hernia   . HSV infection   . Hyperlipidemia   . Radiation    left breast 50.4 gray  . STD (sexually transmitted disease)    HSV    Past Surgical History:  Procedure Laterality Date  . APPENDECTOMY  1961  . BREAST CYST ASPIRATION     x2 right ,x1 left  . COLONOSCOPY  2007   neg  . DILATION AND CURETTAGE OF UTERUS  1975  . FLEXIBLE SIGMOIDOSCOPY     x2  . PELVIC LAPAROSCOPY    . RADIOACTIVE SEED GUIDED PARTIAL MASTECTOMY WITH AXILLARY SENTINEL LYMPH NODE BIOPSY Left 05/31/2015   Procedure: LEFT BREAST RADIOACTIVE SEED GUIDED LUMPECTOMY WITH LEFT AXILLARY SENTINEL LYMPH NODE BIOPSY;  Surgeon: [name], MD;  Location: [place];  Service: General;  Laterality: Left;  . RE-EXCISION OF BREAST LUMPECTOMY Left 07/01/2015   Procedure: RE-EXCISION OF LEFT BREAST INFERIOR MARGIN;  Surgeon: [name], MD;  Location: [place];  Service:  General;  Laterality: Left;  . UPPER GASTROINTESTINAL ENDOSCOPY    . UPPER GI ENDOSCOPY  10/2010   Dr [name]  . WISDOM TOOTH EXTRACTION  1978    There were no vitals filed for this visit.  Subjective Assessment - 05/27/18 0936    Subjective  Patient reports that she had a few good days and then yesterday started having some increased stiffness and pain    Currently in Pain?  Yes    Pain Score  4     Pain Location  Shoulder    Pain Orientation  Right;Lateral    Aggravating Factors   reaching, taking off the shirt                       [facility] Adult PT Treatment/Exercise - 05/27/18 0001      Shoulder Exercises: Seated   Other Seated Exercises  4# bent over, 2# extension, 1# reverse flies      Shoulder Exercises: Standing   External Rotation  20 reps;Theraband    Theraband Level (Shoulder External Rotation)  Level 2 (Red)    Extension  20 reps;Theraband    Theraband Level (Shoulder Extension)  Level 2 (Red)    Other Standing Exercises  ball pass around waist oth directions working  on IR      Shoulder Exercises: ROM/Strengthening   UBE (Upper Arm Bike)  Level 4 x 5 minutes    Lat Pull  2 plate;20 reps    Cybex Press  1 plate;20 reps    Cybex Row  2.5 plate;20 reps    Wall Wash  flexion, circles added scaption    "W" Arms  20 reps PT overpressure for increased ROM    Other ROM/Strengthening Exercises  5# biceps, 25# triceps      Shoulder Exercises: Stretch   Corner Stretch  3 reps;20 seconds      Modalities   Modalities  Electrical Stimulation;Moist Heat      Moist Heat Therapy   Number Minutes Moist Heat  15 Minutes    Moist Heat Location  Shoulder      Electrical Stimulation   Electrical Stimulation Location  right shoulder    Electrical Stimulation Action  IFC    Electrical Stimulation Parameters  supine    Electrical Stimulation Goals  Pain      Manual Therapy   Manual Therapy  Passive ROM    Passive ROM  patient supine, PROM all GH motions to end  range some increased focus on IR, all  with some contract relax               PT Short Term Goals - 05/23/18 1014      PT SHORT TERM GOAL #1   Title  I with HEP    Status  Achieved        PT Long Term Goals - 05/27/18 1104      PT LONG TERM GOAL #1   Title  Patient able to don/doff shirt without bending over.    Status  On-going      PT LONG TERM GOAL #2   Title  Patient able to perform ADLS without limitation in ROM    Status  On-going      PT LONG TERM GOAL #3   Title  Patient able to perform ADLs with pain of 2/10 or less in R shoulder.    Status  On-going      PT LONG TERM GOAL #4   Title  increase flexion to 145 degrees    Status  On-going            Plan - 05/27/18 1102    Clinical Impression Statement  Patient with some concerns about motions, she is improving and has less pain with taking off her shirt, she does have pain with ER/IR.  She does need cues to decreaes the shoulder elevation and to get scapualr retraction fro exercises.  She does have tenderness in the lateral shoulder    PT Next Visit Plan  work on function    Consulted and Agree with Plan of Care  Patient       Patient will benefit from skilled therapeutic intervention in order to improve the following deficits and impairments:  Pain, Increased muscle spasms, Postural dysfunction, Impaired UE functional use, Decreased strength, Decreased range of motion  Visit Diagnosis: Acute pain of right shoulder  Stiffness of right shoulder, not elsewhere classified     Problem List Patient Active Problem List   Diagnosis Date Noted  . 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 Epistaxis 02/05/2018  . Right rotator cuff tear 10/15/2017  . Axillary lymphadenopathy 02/26/2017  . Tinnitus of both ears 01/01/2017  . Reaction to internal stress 05/29/2016  . Prediabetes 08/10/2015  . Breast cancer of  upper-inner quadrant of left female breast ([place]) 05/20/2015  . Open-angle glaucoma 06/17/2014  .  Vitamin D deficiency 10/18/2011  . HSV infection   . Atrophic vaginitis   . VARICOSE VEINS, LOWER EXTREMITIES 09/28/2009  . GERD 07/14/2009  . Hyperlipidemia 12/26/2006  . GILBERT'S SYNDROME 08/09/2006  . DEGENERATIVE DISC DISEASE 08/09/2006    [name], PT 05/27/2018, 11:04 AM  [place] [address] [place], [place], [address] Phone: [phone]   Fax:  [phone]  Name: Lindsey Barrett MRN: [id number] Date of Birth: 01/28/1946

## 2018-05-30 ENCOUNTER — Encounter: Payer: Self-pay | Admitting: Physical Therapy

## 2018-05-30 ENCOUNTER — Ambulatory Visit: Payer: Medicare Other | Admitting: Physical Therapy

## 2018-05-30 DIAGNOSIS — R2232 Localized swelling, mass and lump, left upper limb: Secondary | ICD-10-CM | POA: Diagnosis not present

## 2018-05-30 DIAGNOSIS — M25611 Stiffness of right shoulder, not elsewhere classified: Secondary | ICD-10-CM

## 2018-05-30 DIAGNOSIS — M25511 Pain in right shoulder: Secondary | ICD-10-CM | POA: Diagnosis not present

## 2018-05-30 NOTE — Therapy (Signed)
[place], [place], [address] Phone: [phone]   Fax:  [phone]  Physical Therapy Treatment  Patient Details  Name: Lindsey Barrett MRN: [id number] Date of Birth: 07-07-1945 Referring Provider (PT): Creig Hines   Encounter Date: 05/30/2018  PT End of Session - 05/30/18 1059    Visit Number  6    Date for PT Re-Evaluation  07/11/18    PT Start Time  1009    PT Stop Time  1113    PT Time Calculation (min)  64 min    Activity Tolerance  Patient tolerated treatment well    Behavior During Therapy  [facility] for tasks assessed/performed       Past Medical History:  Diagnosis Date  . Anxiety   . Atrophic vaginitis   . Breast cancer ([place])   . 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[place]) 05/20/2015  . Breast cyst    x3  . DDD (degenerative disc disease)   . Diverticulosis   . Fasting hyperglycemia   . GERD (gastroesophageal reflux disease)   . Gilbert syndrome   . Glaucoma   . History of hiatal hernia   . HSV infection   . Hyperlipidemia   . Radiation    left breast 50.4 gray  . STD (sexually transmitted disease)    HSV    Past Surgical History:  Procedure Laterality Date  . APPENDECTOMY  1961  . BREAST CYST ASPIRATION     x2 right ,x1 left  . COLONOSCOPY  2007   neg  . DILATION AND CURETTAGE OF UTERUS  1975  . FLEXIBLE SIGMOIDOSCOPY     x2  . PELVIC LAPAROSCOPY    . RADIOACTIVE SEED GUIDED PARTIAL MASTECTOMY WITH AXILLARY SENTINEL LYMPH NODE BIOPSY Left 05/31/2015   Procedure: LEFT BREAST RADIOACTIVE SEED GUIDED LUMPECTOMY WITH LEFT AXILLARY SENTINEL LYMPH NODE BIOPSY;  Surgeon: [name], MD;  Location: [facility];  Service: General;  Laterality: Left;  . RE-EXCISION OF BREAST LUMPECTOMY Left 07/01/2015   Procedure: RE-EXCISION OF LEFT BREAST INFERIOR MARGIN;  Surgeon: [name], MD;  Location: [facility];  Service:  General;  Laterality: Left;  . UPPER GASTROINTESTINAL ENDOSCOPY    . UPPER GI ENDOSCOPY  10/2010   Dr [name]  . WISDOM TOOTH EXTRACTION  1978    There were no vitals filed for this visit.  Subjective Assessment - 05/30/18 1014    Subjective  Patient reports a little more sore and stiff, "Raining"    Currently in Pain?  Yes    Pain Score  5     Pain Location  Shoulder    Pain Orientation  Right    Aggravating Factors   rainy weather         [facility] PT Assessment - 05/30/18 0001      AROM   Right Shoulder Flexion  143 Degrees    Right Shoulder ABduction  130 Degrees    Right Shoulder Internal Rotation  38 Degrees                   [facility] Adult PT Treatment/Exercise - 05/30/18 0001      Shoulder Exercises: ROM/Strengthening   UBE (Upper Arm Bike)  Level 4 x 6 minutes    Lat Pull  2 plate;25 reps    Cybex Press  1 plate;25 reps    Cybex Row  2.5 plate;20 reps    Wall  Wash  flexion, circles added scaption    "W" Arms  20 reps PT overpressure for increased ROM    Other ROM/Strengthening Exercises  10# biceps, 25# triceps      Shoulder Exercises: Stretch   Corner Stretch  3 reps;20 seconds      Moist Heat Therapy   Number Minutes Moist Heat  15 Minutes    Moist Heat Location  Shoulder      Electrical Stimulation   Electrical Stimulation Location  right shoulder    Electrical Stimulation Action  IFC    Electrical Stimulation Parameters  suupine    Electrical Stimulation Goals  Pain      Manual Therapy   Manual Therapy  Passive ROM;Joint mobilization    Joint Mobilization  all GH joing mobs Grade III    Passive ROM  PROM to end range all motions with contract relax               PT Short Term Goals - 05/23/18 1014      PT SHORT TERM GOAL #1   Title  I with HEP    Status  Achieved        PT Long Term Goals - 05/27/18 1104      PT LONG TERM GOAL #1   Title  Patient able to don/doff shirt without bending over.    Status  On-going      PT  LONG TERM GOAL #2   Title  Patient able to perform ADLS without limitation in ROM    Status  On-going      PT LONG TERM GOAL #3   Title  Patient able to perform ADLs with pain of 2/10 or less in R shoulder.    Status  On-going      PT LONG TERM GOAL #4   Title  increase flexion to 145 degrees    Status  On-going            Plan - 05/30/18 1059    Clinical Impression Statement  Patient had very good increase in ROM, especially the flexion, she is most limited with IR and is very painful with that.  She is continuing to compensate with the upper trap at times and needs cues to decrease the shoulder elevation    PT Next Visit Plan  may write MD note next visit    Consulted and Agree with Plan of Care  Patient       Patient will benefit from skilled therapeutic intervention in order to improve the following deficits and impairments:  Pain, Increased muscle spasms, Postural dysfunction, Impaired UE functional use, Decreased strength, Decreased range of motion  Visit Diagnosis: Acute pain of right shoulder  Stiffness of right shoulder, not elsewhere classified     Problem List Patient Active Problem List   Diagnosis Date Noted  . 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 Epistaxis 02/05/2018  . Right rotator cuff tear 10/15/2017  . Axillary lymphadenopathy 02/26/2017  . Tinnitus of both ears 01/01/2017  . Reaction to internal stress 05/29/2016  . Prediabetes 08/10/2015  . 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[place]) 05/20/2015  . Open-angle glaucoma 06/17/2014  . Vitamin D deficiency 10/18/2011  . HSV infection   . Atrophic vaginitis   . VARICOSE VEINS, LOWER EXTREMITIES 09/28/2009  . GERD 07/14/2009  . Hyperlipidemia 12/26/2006  . GILBERT'S SYNDROME 08/09/2006  . DEGENERATIVE DISC DISEASE 08/09/2006    [name], PT 05/30/2018, 11:01 AM  [facility]  [place] [address] [place],  [place], [address] Phone: [phone]   Fax:  [phone]  Name: Lindsey Barrett MRN: [id number] Date of Birth: 11/11/1945

## 2018-06-02 ENCOUNTER — Other Ambulatory Visit: Payer: Self-pay

## 2018-06-02 MED ORDER — LETROZOLE 2.5 MG PO TABS: 2.5000 mg | ORAL_TABLET | Freq: Every day | ORAL | 0 refills | Status: DC

## 2018-06-02 NOTE — Telephone Encounter (Signed)
Patient requesting 90 day supply of Letrozole sent to [facility] in [place].  Rx sent, pt aware.  No further needs at this time.

## 2018-06-03 ENCOUNTER — Encounter: Payer: Self-pay | Admitting: Physical Therapy

## 2018-06-03 ENCOUNTER — Ambulatory Visit: Payer: Medicare Other | Admitting: Physical Therapy

## 2018-06-03 DIAGNOSIS — M25511 Pain in right shoulder: Secondary | ICD-10-CM

## 2018-06-03 DIAGNOSIS — M25611 Stiffness of right shoulder, not elsewhere classified: Secondary | ICD-10-CM

## 2018-06-03 DIAGNOSIS — R2232 Localized swelling, mass and lump, left upper limb: Secondary | ICD-10-CM | POA: Diagnosis not present

## 2018-06-03 NOTE — Progress Notes (Signed)
[facility] [address] [place], [place] [address] Phone: [phone] Subjective:   I, [name], am serving as a scribe for Dr. Hulan Saas.  CC: Right shoulder pain follow-up  ATF:[id number]  Lindsey Barrett is a 73 y.o. female coming in with complaint of right shoulder pain. Patient states that the shoulder is doing well. Has been going to PT since 05/07/2018. Making great progress ROM has increased with decreased pain. A few ROM she still cannot complete (Internal rotation, extension).  Patient was initially found to have a rotator cuff tear and did very well.  Then unfortunately had frozen shoulder and has been doing physical therapy.  States that she is overall about 85 to 90% better.       Past Medical History:  Diagnosis Date  . Anxiety   . Atrophic vaginitis   . Breast cancer ([place])   . 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[place]) 05/20/2015  . Breast cyst    x3  . DDD (degenerative disc disease)   . Diverticulosis   . Fasting hyperglycemia   . GERD (gastroesophageal reflux disease)   . Gilbert syndrome   . Glaucoma   . History of hiatal hernia   . HSV infection   . Hyperlipidemia   . Radiation    left breast 50.4 gray  . STD (sexually transmitted disease)    HSV   Past Surgical History:  Procedure Laterality Date  . APPENDECTOMY  1961  . BREAST CYST ASPIRATION     x2 right ,x1 left  . COLONOSCOPY  2007   neg  . DILATION AND CURETTAGE OF UTERUS  1975  . FLEXIBLE SIGMOIDOSCOPY     x2  . PELVIC LAPAROSCOPY    . RADIOACTIVE SEED GUIDED PARTIAL MASTECTOMY WITH AXILLARY SENTINEL LYMPH NODE BIOPSY Left 05/31/2015   Procedure: LEFT BREAST RADIOACTIVE SEED GUIDED LUMPECTOMY WITH LEFT AXILLARY SENTINEL LYMPH NODE BIOPSY;  Surgeon: [name], MD;  Location: [facility];  Service: General;  Laterality: Left;  . RE-EXCISION OF BREAST LUMPECTOMY Left 07/01/2015   Procedure: RE-EXCISION OF  LEFT BREAST INFERIOR MARGIN;  Surgeon: [name], MD;  Location: [facility];  Service: General;  Laterality: Left;  . UPPER GASTROINTESTINAL ENDOSCOPY    . UPPER GI ENDOSCOPY  10/2010   Dr [name]  . WISDOM TOOTH EXTRACTION  1978   Social History   Socioeconomic History  . Marital status: Divorced    Spouse name: Not on file  . Number of children: 0  . Years of education: Not on file  . Highest education level: Not on file  Occupational History  . Occupation: Environmental health practitioner for [employer]  . Financial resource strain: Not hard at all  . Food insecurity:    Worry: Never true    Inability: Never true  . Transportation needs:    Medical: No    Non-medical: No  Tobacco Use  . Smoking status: Former Smoker    Types: Cigarettes    Last attempt to quit: 03/28/1999    Years since quitting: 19.2  . Smokeless tobacco: Never Used  . Tobacco comment: smoked 1967-2000, up to 3/4 ppd  Substance and Sexual Activity  . Alcohol use: Yes    Alcohol/week: 2.0 standard drinks    Types: 2 Cans of beer per week  . Drug use: No  . Sexual activity: Never    Birth control/protection: Post-menopausal    Comment: 1st intercourse  73 yo-More than 5 partners  Lifestyle  . Physical activity:    Days per week: 5 days    Minutes per session: 60 min  . Stress: Not at all  Relationships  . Social connections:    Talks on phone: More than three times a week    Gets together: More than three times a week    Attends religious service: More than 4 times per year    Active member of club or organization: Yes    Attends meetings of clubs or organizations: More than 4 times per year    Relationship status: Divorced  Other Topics Concern  . 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      Regular exercise-no   Allergies  Allergen Reactions  . Septra Ds [Sulfamethoxazole-Trimethoprim] Hives  . Arimidex  [Anastrozole] Other (See Comments)    Unable to function  . Sulfa Antibiotics   . Adhesive [Tape] Rash   Family History  Problem Relation Age of Onset  . Lung cancer Mother        smoker  . Endometriosis Mother   . Cancer Mother        Bladder cancer  . Stomach cancer Maternal Grandmother   . Coronary artery disease Father   . Cancer Father        intestinal carcinoid tumors  . Sudden death Sister        36 week old, congenital deformity  . Liver cancer Maternal Uncle   . Stroke Paternal Grandfather 55  . Leukemia Maternal Uncle   . Breast cancer Paternal Aunt        great paternal aunt-Age 31's  . Colon cancer Paternal Aunt        lived to 56; pat great aunt  . Vascular Disease Paternal Grandmother        thoracic aneurysm  . Esophageal cancer Neg Hx   . Rectal cancer Neg Hx   . Pancreatic cancer Neg Hx      Current Outpatient Medications (Cardiovascular):  .  rosuvastatin (CRESTOR) 40 MG tablet, TAKE 1 TABLET BY MOUTH ONCE DAILY   Current Outpatient Medications (Analgesics):  .  aspirin 81 MG tablet, Take 81 mg by mouth as directed.  [name]  ibuprofen (ADVIL,MOTRIN) 200 MG tablet, Take 200 mg by mouth every 6 (six) hours as needed.   Current Outpatient Medications (Other):  .  bimatoprost (LUMIGAN) 0.01 % SOLN, Place 1 drop into both eyes at bedtime. .  brinzolamide (AZOPT) 1 % ophthalmic suspension, 1 drop 2 (two) times daily. .  calcium carbonate (TUMS - DOSED IN MG ELEMENTAL CALCIUM) 500 MG chewable tablet, Chew 1 tablet by mouth as needed for indigestion or heartburn (1000 mg).  .  Cholecalciferol (VITAMIN D3 PO), Take 1 tablet by mouth daily. .  famotidine (PEPCID) 20 MG tablet, Take 1 tablet by mouth twice daily before breakfast and dinner. [name]  letrozole (FEMARA) 2.5 MG tablet, Take 1 tablet (2.5 mg total) by mouth daily.    Past medical history, social, surgical and family history all reviewed in electronic medical record.  No pertanent information unless stated  regarding to the chief complaint.   Review of Systems:  No headache, visual changes, nausea, vomiting, diarrhea, constipation, dizziness, abdominal pain, skin rash, fevers, chills, night sweats, weight loss, swollen lymph nodes, body aches, joint swelling,  chest pain, shortness of breath, mood changes.  Positive muscle aches  Objective  Blood pressure 140/74, pulse 83, height 5\' 5"  (1.651 m), weight 172 lb (78 kg), SpO2 97 %.   General: No apparent distress alert and oriented x3 mood and affect normal, dressed appropriately.  HEENT: Pupils equal, extraocular movements intact  Respiratory: Patient's speak in full sentences and does not appear short of breath  Cardiovascular: No lower extremity edema, non tender, no erythema  Skin: Warm dry intact with no signs of infection or rash on extremities or on axial skeleton.  Abdomen: Soft nontender  Neuro: Cranial nerves II through XII are intact, neurovascularly intact in all extremities with 2+ DTRs and 2+ pulses.  Lymph: No lymphadenopathy of posterior or anterior cervical chain or axillae bilaterally.  Gait normal with good balance and coordination.  MSK:  Non tender with mild limited range of motion and good stability and symmetric strength and tone of  elbows, wrist, hip, knee and ankles bilaterally. ]  Left wrist does have what appears to be a ganglion cyst on the palmar aspect Right shoulder exam shows the patient has full forward flexion, full full abduction of the shoulder as well.  Patient has decreased internal rotation to sacroiliac joint, 5 out of 5 strength of the rotator cuff noted.  Contralateral shoulder unremarkable    Impression and Recommendations:     T The above documentation has been reviewed and is accurate and complete Lyndal Pulley, DO       Note: This dictation was prepared with Dragon dictation along with smaller phrase technology. Any transcriptional errors that result from this process are unintentional.

## 2018-06-03 NOTE — Telephone Encounter (Signed)
I have spoken with patient.  This is an error made by PT.   Patient has been in communication with that department.

## 2018-06-03 NOTE — Therapy (Signed)
[place], [place], [address] Phone: [phone]   Fax:  [phone]  Physical Therapy Treatment  Patient Details  Name: Lindsey Barrett MRN: [id number] Date of Birth: [birth year] Referring Provider (PT): Creig Hines   Encounter Date: 06/03/2018  PT End of Session - 06/03/18 0919    Visit Number  7    Date for PT Re-Evaluation  07/11/18    PT Start Time  0841    PT Stop Time  0930    PT Time Calculation (min)  49 min    Activity Tolerance  Patient tolerated treatment well    Behavior During Therapy  [facility] for tasks assessed/performed       Past Medical History:  Diagnosis Date  . Anxiety   . Atrophic vaginitis   . Breast cancer ([place])   . 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[place]) 05/20/2015  . Breast cyst    x3  . DDD (degenerative disc disease)   . Diverticulosis   . Fasting hyperglycemia   . GERD (gastroesophageal reflux disease)   . Gilbert syndrome   . Glaucoma   . History of hiatal hernia   . HSV infection   . Hyperlipidemia   . Radiation    left breast 50.4 gray  . STD (sexually transmitted disease)    HSV    Past Surgical History:  Procedure Laterality Date  . APPENDECTOMY  1961  . BREAST CYST ASPIRATION     x2 right ,x1 left  . COLONOSCOPY  2007   neg  . DILATION AND CURETTAGE OF UTERUS  1975  . FLEXIBLE SIGMOIDOSCOPY     x2  . PELVIC LAPAROSCOPY    . RADIOACTIVE SEED GUIDED PARTIAL MASTECTOMY WITH AXILLARY SENTINEL LYMPH NODE BIOPSY Left 05/31/2015   Procedure: LEFT BREAST RADIOACTIVE SEED GUIDED LUMPECTOMY WITH LEFT AXILLARY SENTINEL LYMPH NODE BIOPSY;  Surgeon: [name], MD;  Location: [place];  Service: General;  Laterality: Left;  . RE-EXCISION OF BREAST LUMPECTOMY Left 07/01/2015   Procedure: RE-EXCISION OF LEFT BREAST INFERIOR MARGIN;  Surgeon: [name], MD;  Location: [facility];  Service:  General;  Laterality: Left;  . UPPER GASTROINTESTINAL ENDOSCOPY    . UPPER GI ENDOSCOPY  10/2010   Dr [name]  . WISDOM TOOTH EXTRACTION  1978    There were no vitals filed for this visit.  Subjective Assessment - 06/03/18 0843    Subjective  Reports that she is seeing the MD tomorrow.  Reports that she is feeling a little better today.  Reports that her biggest problem is getting her hand benind her back    Currently in Pain?  Yes    Pain Score  3     Pain Location  Shoulder    Pain Orientation  Right    Pain Descriptors / Indicators  Aching;Tightness;Sore    Aggravating Factors   worse with reaching behind the back         [facility] PT Assessment - 06/03/18 0001      AROM   Right Shoulder Flexion  150 Degrees    Right Shoulder ABduction  145 Degrees    Right Shoulder Internal Rotation  46 Degrees    Right Shoulder External Rotation  85 Degrees                   [facility] Adult PT Treatment/Exercise - 06/03/18 0001      Shoulder  Exercises: Standing   External Rotation  20 reps;Theraband    Theraband Level (Shoulder External Rotation)  Level 2 (Red)    External Rotation Limitations  had her hold 2 seconds at the end of motion    Other Standing Exercises  red tband unsheath sword      Shoulder Exercises: ROM/Strengthening   UBE (Upper Arm Bike)  Level 4 x 6 minutes    Lat Pull  2 plate;25 reps    Cybex Press  1 plate;25 reps    Cybex Row  2.5 plate;20 reps    Wall Wash  flexion, circles scaption with 1# and 2#      Moist Heat Therapy   Number Minutes Moist Heat  10 Minutes    Moist Heat Location  Shoulder      Electrical Stimulation   Electrical Stimulation Location  right shoulder    Electrical Stimulation Action  IFC    Electrical Stimulation Parameters  supine    Electrical Stimulation Goals  Pain      Manual Therapy   Manual Therapy  Passive ROM;Joint mobilization    Joint Mobilization  all GH joing mobs Grade III    Passive ROM  PROM to end range all  motions with contract relax               PT Short Term Goals - 05/23/18 1014      PT SHORT TERM GOAL #1   Title  I with HEP    Status  Achieved        PT Long Term Goals - 06/03/18 5003      PT LONG TERM GOAL #1   Title  Patient able to don/doff shirt without bending over.    Status  Partially Met      PT LONG TERM GOAL #2   Title  Patient able to perform ADLS without limitation in ROM    Status  Partially Met      PT LONG TERM GOAL #3   Title  Patient able to perform ADLs with pain of 2/10 or less in R shoulder.    Status  Partially Met      PT LONG TERM GOAL #4   Title  increase flexion to 145 degrees    Status  Partially Met            Plan - 06/03/18 0920    Clinical Impression Statement  Overall patient has been making good progress with increased ROM and decreased pain with functional activities.  She has the most limitation with IR which limits dressing, she does have times that there is a pain without activity in the right anterior lateral upper arm.  She does well with her HEP and we are working on her return safely to a gym setting or advanced HEP as we continue to gain ROM    PT Next Visit Plan  Will continue to work on her ROM and function while trying to decrease pain    Consulted and Agree with Plan of Care  Patient       Patient will benefit from skilled therapeutic intervention in order to improve the following deficits and impairments:  Pain, Increased muscle spasms, Postural dysfunction, Impaired UE functional use, Decreased strength, Decreased range of motion  Visit Diagnosis: Acute pain of right shoulder  Stiffness of right shoulder, not elsewhere classified     Problem List Patient Active Problem List   Diagnosis Date Noted  . 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  Epistaxis 02/05/2018  . Right rotator cuff tear 10/15/2017  . Axillary lymphadenopathy 02/26/2017  . Tinnitus of both ears 01/01/2017  . Reaction to internal stress  05/29/2016  . Prediabetes 08/10/2015  . 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[place]) 05/20/2015  . Open-angle glaucoma 06/17/2014  . Vitamin D deficiency 10/18/2011  . HSV infection   . Atrophic vaginitis   . VARICOSE VEINS, LOWER EXTREMITIES 09/28/2009  . GERD 07/14/2009  . Hyperlipidemia 12/26/2006  . GILBERT'S SYNDROME 08/09/2006  . DEGENERATIVE DISC DISEASE 08/09/2006    [name], PT 06/03/2018, 9:23 AM  [place] [address] [place], [place], [address] Phone: [phone]   Fax:  [phone]  Name: TEMILOLUWA RECCHIA MRN: [id number] Date of Birth: [birth year]

## 2018-06-04 ENCOUNTER — Encounter: Payer: Self-pay | Admitting: Family Medicine

## 2018-06-04 ENCOUNTER — Ambulatory Visit (INDEPENDENT_AMBULATORY_CARE_PROVIDER_SITE_OTHER): Payer: Medicare Other | Admitting: Family Medicine

## 2018-06-04 DIAGNOSIS — M7501 Adhesive capsulitis of right shoulder: Secondary | ICD-10-CM

## 2018-06-04 DIAGNOSIS — M67432 Ganglion, left wrist: Secondary | ICD-10-CM

## 2018-06-04 NOTE — Patient Instructions (Signed)
Good to see you  I am so glad you are doing better Stay active Finish with Lindsey Barrett If the cyst gets worse call [phone] and we will drain it  Increase to 2 baby aspirin  See me again in 3 months if not perfect

## 2018-06-04 NOTE — Assessment & Plan Note (Signed)
Discussed with patient in great length.  Patient will monitor.  Worsening symptoms consider aspiration

## 2018-06-04 NOTE — Assessment & Plan Note (Signed)
Significant improvement overall.  Patient has been doing with it for nearly 3 months at this time.  Discussed icing regimen and home exercise.  Discussed which activities to do which wants to avoid.  Patient will slowly increase activity.  Follow-up again in 4 to 8 weeks.

## 2018-06-05 DIAGNOSIS — R921 Mammographic calcification found on diagnostic imaging of breast: Secondary | ICD-10-CM | POA: Diagnosis not present

## 2018-06-05 DIAGNOSIS — Z853 Personal history of malignant neoplasm of breast: Secondary | ICD-10-CM | POA: Diagnosis not present

## 2018-06-05 LAB — HM MAMMOGRAPHY

## 2018-06-06 ENCOUNTER — Encounter: Payer: Self-pay | Admitting: Physical Therapy

## 2018-06-06 ENCOUNTER — Ambulatory Visit: Payer: Medicare Other | Admitting: Physical Therapy

## 2018-06-06 DIAGNOSIS — M25611 Stiffness of right shoulder, not elsewhere classified: Secondary | ICD-10-CM

## 2018-06-06 DIAGNOSIS — M25511 Pain in right shoulder: Secondary | ICD-10-CM | POA: Diagnosis not present

## 2018-06-06 DIAGNOSIS — R2232 Localized swelling, mass and lump, left upper limb: Secondary | ICD-10-CM | POA: Diagnosis not present

## 2018-06-06 NOTE — Therapy (Signed)
[place] [address] [place], [place], [address] Phone: [phone]   Fax:  [phone]  Physical Therapy Treatment  Patient Details  Name: Lindsey Barrett MRN: [id number] Date of Birth: 08/15/1945 Referring Provider (PT): Creig Hines   Encounter Date: 06/06/2018  PT End of Session - 06/06/18 1204    Visit Number  8    Date for PT Re-Evaluation  07/11/18    PT Start Time  1010    PT Stop Time  1100    PT Time Calculation (min)  50 min    Activity Tolerance  Patient tolerated treatment well    Behavior During Therapy  [facility] for tasks assessed/performed       Past Medical History:  Diagnosis Date  . Anxiety   . Atrophic vaginitis   . Breast cancer ([place])   . 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[place]) 05/20/2015  . Breast cyst    x3  . DDD (degenerative disc disease)   . Diverticulosis   . Fasting hyperglycemia   . GERD (gastroesophageal reflux disease)   . Gilbert syndrome   . Glaucoma   . History of hiatal hernia   . HSV infection   . Hyperlipidemia   . Radiation    left breast 50.4 gray  . STD (sexually transmitted disease)    HSV    Past Surgical History:  Procedure Laterality Date  . APPENDECTOMY  1961  . BREAST CYST ASPIRATION     x2 right ,x1 left  . COLONOSCOPY  2007   neg  . DILATION AND CURETTAGE OF UTERUS  1975  . FLEXIBLE SIGMOIDOSCOPY     x2  . PELVIC LAPAROSCOPY    . RADIOACTIVE SEED GUIDED PARTIAL MASTECTOMY WITH AXILLARY SENTINEL LYMPH NODE BIOPSY Left 05/31/2015   Procedure: LEFT BREAST RADIOACTIVE SEED GUIDED LUMPECTOMY WITH LEFT AXILLARY SENTINEL LYMPH NODE BIOPSY;  Surgeon: [name], MD;  Location: [place];  Service: General;  Laterality: Left;  . RE-EXCISION OF BREAST LUMPECTOMY Left 07/01/2015   Procedure: RE-EXCISION OF LEFT BREAST INFERIOR MARGIN;  Surgeon: [name], MD;  Location: [place];  Service:  General;  Laterality: Left;  . UPPER GASTROINTESTINAL ENDOSCOPY    . UPPER GI ENDOSCOPY  10/2010   Dr [name]  . WISDOM TOOTH EXTRACTION  1978    There were no vitals filed for this visit.  Subjective Assessment - 06/06/18 1022    Subjective  Saw MD, pleased, felt like she could get incresaed ROM over the next few weeks and then will need a plan for after PT, c/0 pain at night    Currently in Pain?  Yes    Pain Score  2     Pain Location  Shoulder    Pain Orientation  Right    Pain Descriptors / Indicators  Aching    Aggravating Factors   worse at night and reaching behind the back                       [facility] Adult PT Treatment/Exercise - 06/06/18 0001      Shoulder Exercises: Seated   Other Seated Exercises  4# bent over, 2# extension, 1# reverse flies      Shoulder Exercises: Standing   External Rotation  20 reps;Theraband    Theraband Level (Shoulder External Rotation)  Level 2 (Red);Level 3 (Green)    External Rotation Limitations  had her  hold 2 seconds at the end of motion    Other Standing Exercises  red tband unsheath sword    Other Standing Exercises  ball pass around waist oth directions working on IR, ball toss      Shoulder Exercises: ROM/Strengthening   UBE (Upper Arm Bike)  Level 4 x 6 minutes    Lat Pull  2 plate;25 reps    Cybex Press  1 plate;25 reps    Cybex Row  2.5 plate;20 reps    Wall Wash  flexion, circles scaption with  2#    "W" Arms  20 reps PT overpressure for increased ROM    Other ROM/Strengthening Exercises  10# biceps, 25# triceps      Shoulder Exercises: Stretch   Corner Stretch  3 reps;20 seconds    Star Gazer Stretch  3 reps;10 seconds    Other Shoulder Stretches  sleeper stretch      Manual Therapy   Manual Therapy  Passive ROM;Joint mobilization    Joint Mobilization  all GH joing mobs Grade III    Passive ROM  PROM to end range all motions with contract relax               PT Short Term Goals - 05/23/18  1014      PT SHORT TERM GOAL #1   Title  I with HEP    Status  Achieved        PT Long Term Goals - 06/03/18 1308      PT LONG TERM GOAL #1   Title  Patient able to don/doff shirt without bending over.    Status  Partially Met      PT LONG TERM GOAL #2   Title  Patient able to perform ADLS without limitation in ROM    Status  Partially Met      PT LONG TERM GOAL #3   Title  Patient able to perform ADLs with pain of 2/10 or less in R shoulder.    Status  Partially Met      PT LONG TERM GOAL #4   Title  increase flexion to 145 degrees    Status  Partially Met            Plan - 06/06/18 1205    Clinical Impression Statement  Patient reports that the MD was pleased, she feels like over the next two weeks she will need to maximize her function and ROM and know how to do on her own.      PT Next Visit Plan  stat the plan for her on her own    Consulted and Agree with Plan of Care  Patient       Patient will benefit from skilled therapeutic intervention in order to improve the following deficits and impairments:  Pain, Increased muscle spasms, Postural dysfunction, Impaired UE functional use, Decreased strength, Decreased range of motion  Visit Diagnosis: Acute pain of right shoulder  Stiffness of right shoulder, not elsewhere classified     Problem List Patient Active Problem List   Diagnosis Date Noted  . Ganglion cyst of volar aspect of left wrist 06/04/2018  . 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 Epistaxis 02/05/2018  . Right rotator cuff tear 10/15/2017  . Axillary lymphadenopathy 02/26/2017  . Tinnitus of both ears 01/01/2017  . Reaction to internal stress 05/29/2016  . Prediabetes 08/10/2015  . 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[place]) 05/20/2015  . Open-angle glaucoma 06/17/2014  .  Vitamin D deficiency 10/18/2011  . HSV infection   . Atrophic vaginitis   . VARICOSE VEINS, LOWER EXTREMITIES 09/28/2009  . GERD 07/14/2009  .  Hyperlipidemia 12/26/2006  . GILBERT'S SYNDROME 08/09/2006  . DEGENERATIVE DISC DISEASE 08/09/2006    [name], PT 06/06/2018, 12:07 PM  [address] [place], [place], [address] Phone: [phone]   Fax:  [phone]  Name: Lindsey Barrett MRN: [id number] Date of Birth: 09-Aug-1945

## 2018-06-09 ENCOUNTER — Encounter: Payer: Self-pay | Admitting: Internal Medicine

## 2018-06-10 ENCOUNTER — Encounter: Payer: Self-pay | Admitting: Physical Therapy

## 2018-06-10 ENCOUNTER — Ambulatory Visit: Payer: Medicare Other | Attending: Family Medicine | Admitting: Physical Therapy

## 2018-06-10 DIAGNOSIS — M25511 Pain in right shoulder: Secondary | ICD-10-CM | POA: Insufficient documentation

## 2018-06-10 DIAGNOSIS — M25611 Stiffness of right shoulder, not elsewhere classified: Secondary | ICD-10-CM

## 2018-06-10 NOTE — Therapy (Signed)
[place], [place], [address] Phone: [phone]   Fax:  [phone]  Physical Therapy Treatment  Patient Details  Name: Lindsey Barrett MRN: [id number] Date of Birth: 02-01-1946 Referring Provider (PT): Creig Hines   Encounter Date: 06/10/2018  PT End of Session - 06/10/18 1518    Visit Number  9    Date for PT Re-Evaluation  07/11/18    PT Start Time  1438    PT Stop Time  1532    PT Time Calculation (min)  54 min    Activity Tolerance  Patient tolerated treatment well    Behavior During Therapy  [facility] for tasks assessed/performed       Past Medical History:  Diagnosis Date  . Anxiety   . Atrophic vaginitis   . Breast cancer ([place])   . 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[place]) 05/20/2015  . Breast cyst    x3  . DDD (degenerative disc disease)   . Diverticulosis   . Fasting hyperglycemia   . GERD (gastroesophageal reflux disease)   . Gilbert syndrome   . Glaucoma   . History of hiatal hernia   . HSV infection   . Hyperlipidemia   . Radiation    left breast 50.4 gray  . STD (sexually transmitted disease)    HSV    Past Surgical History:  Procedure Laterality Date  . APPENDECTOMY  1961  . BREAST CYST ASPIRATION     x2 right ,x1 left  . COLONOSCOPY  2007   neg  . DILATION AND CURETTAGE OF UTERUS  1975  . FLEXIBLE SIGMOIDOSCOPY     x2  . PELVIC LAPAROSCOPY    . RADIOACTIVE SEED GUIDED PARTIAL MASTECTOMY WITH AXILLARY SENTINEL LYMPH NODE BIOPSY Left 05/31/2015   Procedure: LEFT BREAST RADIOACTIVE SEED GUIDED LUMPECTOMY WITH LEFT AXILLARY SENTINEL LYMPH NODE BIOPSY;  Surgeon: [name], MD;  Location: [place];  Service: General;  Laterality: Left;  . RE-EXCISION OF BREAST LUMPECTOMY Left 07/01/2015   Procedure: RE-EXCISION OF LEFT BREAST INFERIOR MARGIN;  Surgeon: [name], MD;  Location: [place];  Service:  General;  Laterality: Left;  . UPPER GASTROINTESTINAL ENDOSCOPY    . UPPER GI ENDOSCOPY  10/2010   Dr [name]  . WISDOM TOOTH EXTRACTION  1978    There were no vitals filed for this visit.  Subjective Assessment - 06/10/18 1439    Subjective  Patient reports that she has had some stiffness, reports taking shirt off overhead again.    Currently in Pain?  Yes    Pain Score  2     Pain Location  Shoulder    Pain Orientation  Right    Aggravating Factors   behind back activites painful                       [facility] Adult PT Treatment/Exercise - 06/10/18 0001      Shoulder Exercises: Seated   Other Seated Exercises  4# bent over, 3# extension, 1# reverse flies      Shoulder Exercises: Standing   Other Standing Exercises  weighted ball throwing 3 ways    Other Standing Exercises  ball pass around waist oth directions working on IR      Shoulder Exercises: ROM/Strengthening   UBE (Upper Arm Bike)  Level 4 x 6 minutes    Lat Pull  2 plate;25 reps  Cybex Press  1 plate;25 reps    Cybex Row  2.5 plate;20 reps      Shoulder Exercises: IT sales professional  3 reps;20 seconds    Star Gazer Stretch  3 reps;10 seconds    Other Shoulder Stretches  sleeper stretch      Moist Heat Therapy   Number Minutes Moist Heat  10 Minutes    Moist Heat Location  Shoulder      Electrical Stimulation   Electrical Stimulation Location  right shoulder    Electrical Stimulation Action  IFC    Electrical Stimulation Parameters  supine    Electrical Stimulation Goals  Pain      Manual Therapy   Manual Therapy  Passive ROM;Joint mobilization    Joint Mobilization  all GH joint mobs Grade III    Passive ROM  PROM to end range all motions with contract relax               PT Short Term Goals - 05/23/18 1014      PT SHORT TERM GOAL #1   Title  I with HEP    Status  Achieved        PT Long Term Goals - 06/03/18 1700      PT LONG TERM GOAL #1   Title  Patient able  to don/doff shirt without bending over.    Status  Partially Met      PT LONG TERM GOAL #2   Title  Patient able to perform ADLS without limitation in ROM    Status  Partially Met      PT LONG TERM GOAL #3   Title  Patient able to perform ADLs with pain of 2/10 or less in R shoulder.    Status  Partially Met      PT LONG TERM GOAL #4   Title  increase flexion to 145 degrees    Status  Partially Met            Plan - 06/10/18 1520    Clinical Impression Statement  Patient with increase of function, reports able to take shirt off easier, reports able to reach remote easier.  Her biggest issue is IR, still painful and has difficulty reaching her waistband, after stretching can get above waist band at mid back    PT Next Visit Plan  really will need more education about her plan as she reports that she forgets and sometimes does not do the exercises int he past    Consulted and Agree with Plan of Care  Patient       Patient will benefit from skilled therapeutic intervention in order to improve the following deficits and impairments:  Pain, Increased muscle spasms, Postural dysfunction, Impaired UE functional use, Decreased strength, Decreased range of motion  Visit Diagnosis: Acute pain of right shoulder  Stiffness of right shoulder, not elsewhere classified     Problem List Patient Active Problem List   Diagnosis Date Noted  . Ganglion cyst of volar aspect of left wrist 06/04/2018  . 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 Epistaxis 02/05/2018  . Right rotator cuff tear 10/15/2017  . Axillary lymphadenopathy 02/26/2017  . Tinnitus of both ears 01/01/2017  . Reaction to internal stress 05/29/2016  . Prediabetes 08/10/2015  . 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[place]) 05/20/2015  . Open-angle glaucoma 06/17/2014  . Vitamin D deficiency 10/18/2011  . HSV infection   . Atrophic vaginitis   . VARICOSE  VEINS, LOWER EXTREMITIES 09/28/2009  . GERD 07/14/2009   . Hyperlipidemia 12/26/2006  . GILBERT'S SYNDROME 08/09/2006  . DEGENERATIVE DISC DISEASE 08/09/2006    [name], PT 06/10/2018, 3:25 PM  [address] [place], [place], [address] Phone: [phone]   Fax:  [phone]  Name: Lindsey Barrett MRN: [id number] Date of Birth: 20-Oct-1945

## 2018-06-13 ENCOUNTER — Encounter: Payer: Self-pay | Admitting: Physical Therapy

## 2018-06-13 ENCOUNTER — Ambulatory Visit: Payer: Medicare Other | Admitting: Physical Therapy

## 2018-06-13 DIAGNOSIS — M25511 Pain in right shoulder: Secondary | ICD-10-CM

## 2018-06-13 DIAGNOSIS — M25611 Stiffness of right shoulder, not elsewhere classified: Secondary | ICD-10-CM

## 2018-06-13 NOTE — Therapy (Signed)
[place], [place], [address] Phone: [phone]   Fax:  [phone] Progress Note Reporting Period 05/12/18 to 06/13/18 for the first 10 visits  See note below for Objective Data and Assessment of Progress/Goals.      Physical Therapy Treatment  Patient Details  Name: Lindsey Barrett MRN: [id number] Date of Birth: [birth year] Referring Provider (PT): Creig Hines   Encounter Date: 06/13/2018  PT End of Session - 06/13/18 1050    Visit Number  10    Date for PT Re-Evaluation  07/11/18    PT Start Time  1000    PT Stop Time  1103    PT Time Calculation (min)  63 min    Activity Tolerance  Patient tolerated treatment well    Behavior During Therapy  [facility] for tasks assessed/performed       Past Medical History:  Diagnosis Date  . Anxiety   . Atrophic vaginitis   . Breast cancer ([employer])   . 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[place]) 05/20/2015  . Breast cyst    x3  . DDD (degenerative disc disease)   . Diverticulosis   . Fasting hyperglycemia   . GERD (gastroesophageal reflux disease)   . Gilbert syndrome   . Glaucoma   . History of hiatal hernia   . HSV infection   . Hyperlipidemia   . Radiation    left breast 50.4 gray  . STD (sexually transmitted disease)    HSV    Past Surgical History:  Procedure Laterality Date  . APPENDECTOMY  1961  . BREAST CYST ASPIRATION     x2 right ,x1 left  . COLONOSCOPY  2007   neg  . DILATION AND CURETTAGE OF UTERUS  1975  . FLEXIBLE SIGMOIDOSCOPY     x2  . PELVIC LAPAROSCOPY    . RADIOACTIVE SEED GUIDED PARTIAL MASTECTOMY WITH AXILLARY SENTINEL LYMPH NODE BIOPSY Left 05/31/2015   Procedure: LEFT BREAST RADIOACTIVE SEED GUIDED LUMPECTOMY WITH LEFT AXILLARY SENTINEL LYMPH NODE BIOPSY;  Surgeon: [name], MD;  Location: [place];  Service: General;  Laterality: Left;  . RE-EXCISION OF BREAST LUMPECTOMY  Left 07/01/2015   Procedure: RE-EXCISION OF LEFT BREAST INFERIOR MARGIN;  Surgeon: [name], MD;  Location: [place];  Service: General;  Laterality: Left;  . UPPER GASTROINTESTINAL ENDOSCOPY    . UPPER GI ENDOSCOPY  10/2010   Dr [name]  . WISDOM TOOTH EXTRACTION  1978    There were no vitals filed for this visit.  Subjective Assessment - 06/13/18 1011    Subjective  Patient reports that she had two sharp stabbing pains this AM.  One while driving car and one when signing name    Currently in Pain?  Yes    Pain Score  4     Pain Location  Shoulder    Pain Orientation  Right;Lateral                       [facility] Adult PT Treatment/Exercise - 06/13/18 0001      Shoulder Exercises: Standing   External Rotation  20 reps;Theraband    Theraband Level (Shoulder External Rotation)  Level 2 (Red);Level 3 (Green)    External Rotation Limitations  had her hold 2 seconds at the end of motion    Other Standing Exercises  yellow tband unsheath sword    Other Standing Exercises  ball pass around waist oth directions working on IR      Shoulder Exercises: ROM/Strengthening   UBE (Upper Arm Bike)  Level 4 x 6 minutes    Other ROM/Strengthening Exercises  2# UE circuit 2 rounds, 5# bent over rows, 2# bent over extension and reverse flies    Other ROM/Strengthening Exercises  10# biceps, 25# triceps      Shoulder Exercises: Stretch   Other Shoulder Stretches  sleeper stretch      Moist Heat Therapy   Number Minutes Moist Heat  10 Minutes    Moist Heat Location  Shoulder      Electrical Stimulation   Electrical Stimulation Location  right shoulder    Electrical Stimulation Action  IFC    Electrical Stimulation Parameters  supine    Electrical Stimulation Goals  Pain      Manual Therapy   Manual Therapy  Passive ROM;Joint mobilization    Joint Mobilization  all GH joint mobs Grade III    Passive ROM  PROM to end range all motions with contract relax,  really focus on IR               PT Short Term Goals - 05/23/18 1014      PT SHORT TERM GOAL #1   Title  I with HEP    Status  Achieved        PT Long Term Goals - 06/13/18 1240      PT LONG TERM GOAL #1   Title  Patient able to don/doff shirt without bending over.    Status  Achieved            Plan - 06/13/18 1238    Clinical Impression Statement  Patient had a few sharp pains in the right lateral shoulder today.  She is very tight with IR.  She is able to get to the mid back above her belt.  Some increases in ROM    PT Next Visit Plan  start the gym education    Consulted and Agree with Plan of Care  Patient       Patient will benefit from skilled therapeutic intervention in order to improve the following deficits and impairments:  Pain, Increased muscle spasms, Postural dysfunction, Impaired UE functional use, Decreased strength, Decreased range of motion  Visit Diagnosis: Acute pain of right shoulder  Stiffness of right shoulder, not elsewhere classified     Problem List Patient Active Problem List   Diagnosis Date Noted  . Ganglion cyst of volar aspect of left wrist 06/04/2018  . 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 Epistaxis 02/05/2018  . Right rotator cuff tear 10/15/2017  . Axillary lymphadenopathy 02/26/2017  . Tinnitus of both ears 01/01/2017  . Reaction to internal stress 05/29/2016  . Prediabetes 08/10/2015  . 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[place]) 05/20/2015  . Open-angle glaucoma 06/17/2014  . Vitamin D deficiency 10/18/2011  . HSV infection   . Atrophic vaginitis   . VARICOSE VEINS, LOWER EXTREMITIES 09/28/2009  . GERD 07/14/2009  . Hyperlipidemia 12/26/2006  . GILBERT'S SYNDROME 08/09/2006  . DEGENERATIVE DISC DISEASE 08/09/2006    [name], PT 06/13/2018, 12:40 PM  [address] [place], [place], [address] Phone:  [phone]   Fax:  [phone]  Name: MARGARITA CROKE MRN: [id number] Date of Birth: 1945/07/20

## 2018-06-17 ENCOUNTER — Ambulatory Visit: Payer: Medicare Other | Admitting: Physical Therapy

## 2018-06-17 ENCOUNTER — Encounter: Payer: Self-pay | Admitting: Physical Therapy

## 2018-06-17 DIAGNOSIS — M25611 Stiffness of right shoulder, not elsewhere classified: Secondary | ICD-10-CM

## 2018-06-17 DIAGNOSIS — M25511 Pain in right shoulder: Secondary | ICD-10-CM

## 2018-06-17 NOTE — Therapy (Signed)
[place], [place], [address] Phone: [phone]   Fax:  [phone]  Physical Therapy Treatment  Patient Details  Name: Lindsey Barrett MRN: [id number] Date of Birth: 1946-03-07 Referring Provider (PT): Creig Hines   Encounter Date: 06/17/2018  PT End of Session - 06/17/18 1012    Visit Number  11    Date for PT Re-Evaluation  07/11/18    PT Start Time  0926    PT Stop Time  1023    PT Time Calculation (min)  57 min    Activity Tolerance  Patient tolerated treatment well    Behavior During Therapy  [facility] for tasks assessed/performed       Past Medical History:  Diagnosis Date  . Anxiety   . Atrophic vaginitis   . Breast cancer ([place])   . 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[place]) 05/20/2015  . Breast cyst    x3  . DDD (degenerative disc disease)   . Diverticulosis   . Fasting hyperglycemia   . GERD (gastroesophageal reflux disease)   . Gilbert syndrome   . Glaucoma   . History of hiatal hernia   . HSV infection   . Hyperlipidemia   . Radiation    left breast 50.4 gray  . STD (sexually transmitted disease)    HSV    Past Surgical History:  Procedure Laterality Date  . APPENDECTOMY  1961  . BREAST CYST ASPIRATION     x2 right ,x1 left  . COLONOSCOPY  2007   neg  . DILATION AND CURETTAGE OF UTERUS  1975  . FLEXIBLE SIGMOIDOSCOPY     x2  . PELVIC LAPAROSCOPY    . RADIOACTIVE SEED GUIDED PARTIAL MASTECTOMY WITH AXILLARY SENTINEL LYMPH NODE BIOPSY Left 05/31/2015   Procedure: LEFT BREAST RADIOACTIVE SEED GUIDED LUMPECTOMY WITH LEFT AXILLARY SENTINEL LYMPH NODE BIOPSY;  Surgeon: [name], MD;  Location: [place];  Service: General;  Laterality: Left;  . RE-EXCISION OF BREAST LUMPECTOMY Left 07/01/2015   Procedure: RE-EXCISION OF LEFT BREAST INFERIOR MARGIN;  Surgeon: [name], MD;  Location: [place];   Service: General;  Laterality: Left;  . UPPER GASTROINTESTINAL ENDOSCOPY    . UPPER GI ENDOSCOPY  10/2010   Dr [name]  . WISDOM TOOTH EXTRACTION  1978    There were no vitals filed for this visit.  Subjective Assessment - 06/17/18 0924    Subjective  Patient reports that she has been busy and has not been doing exercises    Currently in Pain?  Yes    Pain Score  2     Pain Location  Shoulder    Pain Orientation  Right    Aggravating Factors   reports some difficulty at times with curling iron on the back of her head                       [facility] Adult PT Treatment/Exercise - 06/17/18 0001      Shoulder Exercises: Standing   External Rotation  20 reps;Theraband    Theraband Level (Shoulder External Rotation)  Level 2 (Red);Level 3 (Green)    Extension  20 reps    Extension Limitations  with PT overpressure to get more stretch      Shoulder Exercises: ROM/Strengthening   UBE (Upper Arm Bike)  Level 4 x 6 minutes    Lat Pull  2 plate;25 reps  Cybex Press  1 plate;25 reps    Cybex Row  2.5 plate;20 reps    Other ROM/Strengthening Exercises  hang stretch on pullup handles, 3# UE circuit      Moist Heat Therapy   Number Minutes Moist Heat  10 Minutes    Moist Heat Location  Shoulder      Electrical Stimulation   Electrical Stimulation Location  right shoulder    Electrical Stimulation Action  IFC    Electrical Stimulation Parameters  supine    Electrical Stimulation Goals  Pain      Manual Therapy   Manual Therapy  Passive ROM;Joint mobilization    Joint Mobilization  all GH joint mobs Grade III    Passive ROM  PROM to end range all motions with contract relax, really focus on IR               PT Short Term Goals - 05/23/18 1014      PT SHORT TERM GOAL #1   Title  I with HEP    Status  Achieved        PT Long Term Goals - 06/13/18 1240      PT LONG TERM GOAL #1   Title  Patient able to don/doff shirt without bending over.    Status   Achieved            Plan - 06/17/18 1012    Clinical Impression Statement  Patient with better motions and reports was able to reach bra "one time last week".  She is tightest in IR, the ER really seems to be progressing.  Started the education to get her to stretch at home especially sleeper stretch.      PT Next Visit Plan  Will measure and then have her try to skip a week and see how her ROM is    Consulted and Agree with Plan of Care  Patient       Patient will benefit from skilled therapeutic intervention in order to improve the following deficits and impairments:  Pain, Increased muscle spasms, Postural dysfunction, Impaired UE functional use, Decreased strength, Decreased range of motion  Visit Diagnosis: Acute pain of right shoulder  Stiffness of right shoulder, not elsewhere classified     Problem List Patient Active Problem List   Diagnosis Date Noted  . Ganglion cyst of volar aspect of left wrist 06/04/2018  . 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 Epistaxis 02/05/2018  . Right rotator cuff tear 10/15/2017  . Axillary lymphadenopathy 02/26/2017  . Tinnitus of both ears 01/01/2017  . Reaction to internal stress 05/29/2016  . Prediabetes 08/10/2015  . 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[place]) 05/20/2015  . Open-angle glaucoma 06/17/2014  . Vitamin D deficiency 10/18/2011  . HSV infection   . Atrophic vaginitis   . VARICOSE VEINS, LOWER EXTREMITIES 09/28/2009  . GERD 07/14/2009  . Hyperlipidemia 12/26/2006  . GILBERT'S SYNDROME 08/09/2006  . DEGENERATIVE DISC DISEASE 08/09/2006    [name], PT 06/17/2018, 10:14 AM  [address] [place], [place], [address] Phone: [phone]   Fax:  [phone]  Name: Lindsey Barrett MRN: [id number] Date of Birth: [birth year]

## 2018-06-20 ENCOUNTER — Encounter: Payer: Self-pay | Admitting: Physical Therapy

## 2018-06-20 ENCOUNTER — Encounter: Payer: Self-pay | Admitting: Internal Medicine

## 2018-06-20 ENCOUNTER — Ambulatory Visit: Payer: Medicare Other | Admitting: Physical Therapy

## 2018-06-20 ENCOUNTER — Encounter: Payer: Medicare Other | Admitting: Physical Therapy

## 2018-06-20 DIAGNOSIS — M25611 Stiffness of right shoulder, not elsewhere classified: Secondary | ICD-10-CM

## 2018-06-20 DIAGNOSIS — M25511 Pain in right shoulder: Secondary | ICD-10-CM

## 2018-06-20 NOTE — Therapy (Signed)
[facility] [place], [place], [address] Phone: [phone]   Fax:  [phone]  Physical Therapy Treatment  Patient Details  Name: Lindsey Barrett MRN: [id number] Date of Birth: 18-Oct-1945 Referring Provider (PT): Creig Hines   Encounter Date: 06/20/2018  PT End of Session - 06/20/18 1054    Visit Number  12    PT Start Time  9373    PT Stop Time  1114    PT Time Calculation (min)  60 min    Activity Tolerance  Patient tolerated treatment well    Behavior During Therapy  [facility] for tasks assessed/performed       Past Medical History:  Diagnosis Date  . Anxiety   . Atrophic vaginitis   . Breast cancer ([place])   . 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[place]) 05/20/2015  . Breast cyst    x3  . DDD (degenerative disc disease)   . Diverticulosis   . Fasting hyperglycemia   . GERD (gastroesophageal reflux disease)   . Gilbert syndrome   . Glaucoma   . History of hiatal hernia   . HSV infection   . Hyperlipidemia   . Radiation    left breast 50.4 gray  . STD (sexually transmitted disease)    HSV    Past Surgical History:  Procedure Laterality Date  . APPENDECTOMY  1961  . BREAST CYST ASPIRATION     x2 right ,x1 left  . COLONOSCOPY  2007   neg  . DILATION AND CURETTAGE OF UTERUS  1975  . FLEXIBLE SIGMOIDOSCOPY     x2  . PELVIC LAPAROSCOPY    . RADIOACTIVE SEED GUIDED PARTIAL MASTECTOMY WITH AXILLARY SENTINEL LYMPH NODE BIOPSY Left 05/31/2015   Procedure: LEFT BREAST RADIOACTIVE SEED GUIDED LUMPECTOMY WITH LEFT AXILLARY SENTINEL LYMPH NODE BIOPSY;  Surgeon: [name], MD;  Location: [facility];  Service: General;  Laterality: Left;  . RE-EXCISION OF BREAST LUMPECTOMY Left 07/01/2015   Procedure: RE-EXCISION OF LEFT BREAST INFERIOR MARGIN;  Surgeon: [name], MD;  Location: [facility];  Service: General;  Laterality: Left;  . UPPER  GASTROINTESTINAL ENDOSCOPY    . UPPER GI ENDOSCOPY  10/2010   Dr [name]  . WISDOM TOOTH EXTRACTION  1978    There were no vitals filed for this visit.  Subjective Assessment - 06/20/18 1020    Subjective  Patietn reports that she woke up with pain today    Currently in Pain?  Yes    Pain Score  3     Pain Location  Shoulder    Pain Orientation  Right    Pain Descriptors / Indicators  Aching         [facility] PT Assessment - 06/20/18 0001      AROM   Right Shoulder Flexion  157 Degrees    Right Shoulder ABduction  147 Degrees    Right Shoulder Internal Rotation  55 Degrees    Right Shoulder External Rotation  85 Degrees                   [facility] Adult PT Treatment/Exercise - 06/20/18 0001      Shoulder Exercises: ROM/Strengthening   UBE (Upper Arm Bike)  Level 4 x 6 minutes      Moist Heat Therapy   Number Minutes Moist Heat  10 Minutes    Moist Heat Location  Shoulder  Museum/gallery conservator  IFC    Electrical Stimulation Parameters  supine    Electrical Stimulation Goals  Pain      Manual Therapy   Manual Therapy  Passive ROM;Joint mobilization    Joint Mobilization  all GH joint mobs Grade III    Passive ROM  PROM to end range all motions with contract relax, really focus on IR, in supine and sitting, really trying to get right hand to bra in the back             PT Education - 06/20/18 1053    Education Details  spent a lot of time today going over what she can do a tth egym and then what she should be doing to maintain and help increase her ROM, she reports that she understands it is just "doing it"    Person(s) Educated  Patient    Methods  Explanation;Demonstration;Tactile cues;Verbal cues;Handout    Comprehension  Verbal cues required;Verbalized understanding;Returned demonstration       PT Short Term Goals - 05/23/18 1014      PT SHORT TERM GOAL #1    Title  I with HEP    Status  Achieved        PT Long Term Goals - 06/20/18 1059      PT LONG TERM GOAL #1   Title  Patient able to don/doff shirt without bending over.    Status  Achieved      PT LONG TERM GOAL #2   Title  Patient able to perform ADLS without limitation in ROM    Status  Achieved      PT LONG TERM GOAL #3   Title  Patient able to perform ADLs with pain of 2/10 or less in R shoulder.    Status  Achieved      PT LONG TERM GOAL #4   Title  increase flexion to 145 degrees    Status  Achieved      PT LONG TERM GOAL #5   Title  increase IR to 65 degrees    Status  Partially Met            Plan - 06/20/18 1055    Clinical Impression Statement  Patient with good gain is ROM for fleixon and IR.  She reports that she was able to do her bra in the back twice this week.  She feels like is doing better but she feels like she has difficulty staying motivated and doing the exerdises, I gave her different ways to stay on track and that she could come in and we could look at her motions and compare to assure that she does not regress    PT Next Visit Plan  D/C goals met    Consulted and Agree with Plan of Care  Patient       Patient will benefit from skilled therapeutic intervention in order to improve the following deficits and impairments:  Pain, Increased muscle spasms, Postural dysfunction, Impaired UE functional use, Decreased strength, Decreased range of motion  Visit Diagnosis: Acute pain of right shoulder  Stiffness of right shoulder, not elsewhere classified     Problem List Patient Active Problem List   Diagnosis Date Noted  . Ganglion cyst of volar aspect of left wrist 06/04/2018  . 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 Epistaxis 02/05/2018  . Right rotator cuff tear 10/15/2017  . Axillary lymphadenopathy 02/26/2017  .  Tinnitus of both ears 01/01/2017  . Reaction to internal stress 05/29/2016  . Prediabetes 08/10/2015  . Breast cancer of upper-inner  quadrant of left female breast ([place]) 05/20/2015  . Open-angle glaucoma 06/17/2014  . Vitamin D deficiency 10/18/2011  . HSV infection   . Atrophic vaginitis   . VARICOSE VEINS, LOWER EXTREMITIES 09/28/2009  . GERD 07/14/2009  . Hyperlipidemia 12/26/2006  . GILBERT'S SYNDROME 08/09/2006  . DEGENERATIVE DISC DISEASE 08/09/2006    [name], PT 06/20/2018, 11:00 AM  [facility] [address], [place], [address] Phone: [phone]   Fax:  [phone]  Name: Lindsey Barrett MRN: [id number] Date of Birth: 07-Dec-1945

## 2018-06-20 NOTE — Telephone Encounter (Signed)
MD is out of the office until 2/24. Pls advise on email.Marland KitchenJohny Barrett

## 2018-06-21 ENCOUNTER — Other Ambulatory Visit: Payer: Self-pay | Admitting: Physician Assistant

## 2018-07-03 ENCOUNTER — Encounter: Payer: Medicare Other | Admitting: Physical Therapy

## 2018-07-10 NOTE — Progress Notes (Signed)
Subjective:    Patient ID: Lindsey Barrett, female    DOB: 03-18-1946, 73 y.o.   MRN: [id number]  HPI The patient is here for an acute visit.   In [place] she is going to [place]   Hepatitis A vaccine:  She had the first vaccine in 2017, but did not have the second vaccine at that time.  She wanted know if she needed to redo hepatitis A vaccine she wanted to know if she should get the Hepatitis B vaccine.    [place] and would like to take an antibiotic with her.  She has had a UTI a couple of times in the past couple of years.  She is also taking a broad-spectrum antibiotic with her in the past.  She does not want to use antibiotics unless is necessary.  Cysts on left hand: She noticed a small cyst in the anterior aspect of her left wrist.  This nontender and has decreased in size.  She also has a small nodule in her left palm and wondered if that was a cyst as well.  Varicose veins in left leg:  She has had this for years.  She denies any discomfort, but occasionally gets some light pains in that leg-often when sitting.  She would like to looked at to see if it is progressing normally or if there is a concern.  She denies any swelling in the legs.  She has noticed more visible small veins around her ankle and foot.    Medications and allergies reviewed with patient and updated if appropriate.  Patient Active Problem List   Diagnosis Date Noted  . Dupuytren's contracture of both hands 07/11/2018  . Ganglion cyst of volar aspect of left wrist 06/04/2018  . 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 Epistaxis 02/05/2018  . Right rotator cuff tear 10/15/2017  . Axillary lymphadenopathy 02/26/2017  . Tinnitus of both ears 01/01/2017  . Reaction to internal stress 05/29/2016  . Prediabetes 08/10/2015  . 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[facility]) 05/20/2015  . Open-angle glaucoma 06/17/2014  . Vitamin D deficiency 10/18/2011  . HSV infection   .  Atrophic vaginitis   . VARICOSE VEINS, LOWER EXTREMITIES 09/28/2009  . GERD 07/14/2009  . Hyperlipidemia 12/26/2006  . GILBERT'S SYNDROME 08/09/2006  . DEGENERATIVE DISC DISEASE 08/09/2006    Current Outpatient Medications on File Prior to Visit  Medication Sig Dispense Refill  . aspirin 81 MG tablet Take 81 mg by mouth 2 (two) times daily.     . bimatoprost (LUMIGAN) 0.01 % SOLN Place 1 drop into both eyes at bedtime.    . brinzolamide (AZOPT) 1 % ophthalmic suspension 1 drop 2 (two) times daily.    . calcium carbonate (TUMS - DOSED IN MG ELEMENTAL CALCIUM) 500 MG chewable tablet Chew 1 tablet by mouth as needed for indigestion or heartburn (1000 mg).     . Cholecalciferol (VITAMIN D3 PO) Take 1 tablet by mouth daily.    . famotidine (PEPCID) 20 MG tablet TAKE 1 TABLET BY MOUTH TWICE DAILY BEFORE BREAKFAST AND  AT  DINNER 60 tablet 3  . ibuprofen (ADVIL,MOTRIN) 200 MG tablet Take 200 mg by mouth every 6 (six) hours as needed.    [name] letrozole (FEMARA) 2.5 MG tablet Take 1 tablet (2.5 mg total) by mouth daily. 90 tablet 0  . rosuvastatin (CRESTOR) 40 MG tablet TAKE 1 TABLET BY MOUTH ONCE DAILY 90 tablet 2  . [DISCONTINUED] metoprolol  tartrate (LOPRESSOR) 25 MG tablet Take 1 tablet (25 mg total) by mouth as directed. 1 pill every 8 hours as needed for tachycardia or palpitations. 30 tablet 2   No current facility-administered medications on file prior to visit.     Past Medical History:  Diagnosis Date  . Anxiety   . Atrophic vaginitis   . Breast cancer ([place])   . 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[place]) 05/20/2015  . Breast cyst    x3  . DDD (degenerative disc disease)   . Diverticulosis   . Fasting hyperglycemia   . GERD (gastroesophageal reflux disease)   . Gilbert syndrome   . Glaucoma   . History of hiatal hernia   . HSV infection   . Hyperlipidemia   . Radiation    left breast 50.4 gray  . STD (sexually transmitted disease)    HSV    Past  Surgical History:  Procedure Laterality Date  . APPENDECTOMY  1961  . BREAST CYST ASPIRATION     x2 right ,x1 left  . COLONOSCOPY  2007   neg  . DILATION AND CURETTAGE OF UTERUS  1975  . FLEXIBLE SIGMOIDOSCOPY     x2  . PELVIC LAPAROSCOPY    . RADIOACTIVE SEED GUIDED PARTIAL MASTECTOMY WITH AXILLARY SENTINEL LYMPH NODE BIOPSY Left 05/31/2015   Procedure: LEFT BREAST RADIOACTIVE SEED GUIDED LUMPECTOMY WITH LEFT AXILLARY SENTINEL LYMPH NODE BIOPSY;  Surgeon: [name], MD;  Location: [facility];  Service: General;  Laterality: Left;  . RE-EXCISION OF BREAST LUMPECTOMY Left 07/01/2015   Procedure: RE-EXCISION OF LEFT BREAST INFERIOR MARGIN;  Surgeon: [name], MD;  Location: [facility];  Service: General;  Laterality: Left;  . UPPER GASTROINTESTINAL ENDOSCOPY    . UPPER GI ENDOSCOPY  10/2010   Dr [name]  . WISDOM TOOTH EXTRACTION  1978    Social History   Socioeconomic History  . Marital status: Divorced    Spouse name: Not on file  . Number of children: 0  . Years of education: Not on file  . Highest education level: Not on file  Occupational History  . Occupation: Environmental health practitioner for [employer]  . Financial resource strain: Not hard at all  . Food insecurity:    Worry: Never true    Inability: Never true  . Transportation needs:    Medical: No    Non-medical: No  Tobacco Use  . Smoking status: Former Smoker    Types: Cigarettes    Last attempt to quit: 03/28/1999    Years since quitting: 19.3  . Smokeless tobacco: Never Used  . Tobacco comment: smoked 1967-2000, up to 3/4 ppd  Substance and Sexual Activity  . Alcohol use: Yes    Alcohol/week: 2.0 standard drinks    Types: 2 Cans of beer per week  . Drug use: No  . Sexual activity: Never    Birth control/protection: Post-menopausal    Comment: 1st intercourse 73 yo-More than 5 partners  Lifestyle  . Physical activity:    Days per week: 5 days    Minutes per  session: 60 min  . Stress: Not at all  Relationships  . Social connections:    Talks on phone: More than three times a week    Gets together: More than three times a week    Attends religious service: More than 4 times per year    Active member of club or organization: Yes  Attends meetings of clubs or organizations: More than 4 times per year    Relationship status: Divorced  Other Topics Concern  . 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      Regular exercise-no    Family History  Problem Relation Age of Onset  . Lung cancer Mother        smoker  . Endometriosis Mother   . Cancer Mother        Bladder cancer  . Stomach cancer Maternal Grandmother   . Coronary artery disease Father   . Cancer Father        intestinal carcinoid tumors  . Sudden death Sister        65 week old, congenital deformity  . Liver cancer Maternal Uncle   . Stroke Paternal Grandfather 10  . Leukemia Maternal Uncle   . Breast cancer Paternal Aunt        great paternal aunt-Age 82's  . Colon cancer Paternal Aunt        lived to 62; pat great aunt  . Vascular Disease Paternal Grandmother        thoracic aneurysm  . Esophageal cancer Neg Hx   . Rectal cancer Neg Hx   . Pancreatic cancer Neg Hx     Review of Systems  Constitutional: Negative for fever.  Cardiovascular: Negative for leg swelling.       Increased blood vessels bilateral legs  Musculoskeletal:       Cyst left wrist, nodule left palm       Objective:   Vitals:   07/11/18 1512  BP: (!) 142/74  Pulse: 72  Resp: 16  Temp: 99.1 F (37.3 C)  SpO2: 96%   BP Readings from Last 3 Encounters:  07/11/18 (!) 142/74  06/04/18 140/74  04/22/18 132/76   Wt Readings from Last 3 Encounters:  07/11/18 175 lb (79.4 kg)  06/04/18 172 lb (78 kg)  04/22/18 174 lb (78.9 kg)   Body mass index is 29.12 kg/m.   Physical Exam Cardiovascular:     Comments: Bilateral  lower extremity varicose veins-superficial.  Nontender, no evidence of superficial phlebitis Musculoskeletal:     Right lower leg: No edema.     Left lower leg: No edema.     Comments: Very mild dupuytren's contractures bilateral palms-nodule left hand-nontender, full range of motion of fingers.  Minimal ganglion cyst left anterior wrist-nontender  Skin:    General: Skin is warm and dry.            Assessment & Plan:    See Problem List for Assessment and Plan of chronic medical problems.

## 2018-07-11 ENCOUNTER — Ambulatory Visit (INDEPENDENT_AMBULATORY_CARE_PROVIDER_SITE_OTHER): Payer: Medicare Other | Admitting: Internal Medicine

## 2018-07-11 ENCOUNTER — Encounter: Payer: Self-pay | Admitting: Internal Medicine

## 2018-07-11 VITALS — BP 142/74 | HR 72 | Temp 99.1°F | Resp 16 | Ht 65.0 in | Wt 175.0 lb

## 2018-07-11 DIAGNOSIS — H1859 Other hereditary corneal dystrophies: Secondary | ICD-10-CM | POA: Diagnosis not present

## 2018-07-11 DIAGNOSIS — H53453 Other localized visual field defect, bilateral: Secondary | ICD-10-CM | POA: Diagnosis not present

## 2018-07-11 DIAGNOSIS — H40053 Ocular hypertension, bilateral: Secondary | ICD-10-CM | POA: Diagnosis not present

## 2018-07-11 DIAGNOSIS — I8390 Asymptomatic varicose veins of unspecified lower extremity: Secondary | ICD-10-CM

## 2018-07-11 DIAGNOSIS — Z23 Encounter for immunization: Secondary | ICD-10-CM

## 2018-07-11 DIAGNOSIS — M720 Palmar fascial fibromatosis [Dupuytren]: Secondary | ICD-10-CM | POA: Diagnosis not present

## 2018-07-11 DIAGNOSIS — H1851 Endothelial corneal dystrophy: Secondary | ICD-10-CM | POA: Diagnosis not present

## 2018-07-11 MED ORDER — NITROFURANTOIN MONOHYD MACRO 100 MG PO CAPS: 100.0000 mg | ORAL_CAPSULE | Freq: Two times a day (BID) | ORAL | 0 refills | Status: DC

## 2018-07-11 MED ORDER — AMOXICILLIN-POT CLAVULANATE 875-125 MG PO TABS: 1.0000 | ORAL_TABLET | Freq: Two times a day (BID) | ORAL | 0 refills | Status: DC

## 2018-07-11 NOTE — Patient Instructions (Addendum)
  Hepatitis A administered today.    Medications reviewed and updated.  Changes include :   None   Two different antibiotics were sent to your pharmacy for your travel.

## 2018-07-11 NOTE — Assessment & Plan Note (Signed)
Very mild Asymptomatic Discussed that if this worsens and may require treatment

## 2018-07-11 NOTE — Assessment & Plan Note (Signed)
Bilateral lower extremities varicose veins Occasional discomfort, but typically occurs at rest and is transient No edema Has had increased visible blood vessels Discussed elevating legs when sitting, low-sodium diet and compression socks At this point no treatment needed We will monitor

## 2018-07-14 ENCOUNTER — Emergency Department (HOSPITAL_COMMUNITY): Payer: Medicare Other

## 2018-07-14 ENCOUNTER — Emergency Department (HOSPITAL_COMMUNITY)
Admission: EM | Admit: 2018-07-14 | Discharge: 2018-07-14 | Disposition: A | Discharge status: Home / Self Care | Payer: Medicare Other | Attending: Emergency Medicine | Admitting: Emergency Medicine

## 2018-07-14 ENCOUNTER — Encounter (HOSPITAL_COMMUNITY): Payer: Self-pay

## 2018-07-14 ENCOUNTER — Other Ambulatory Visit: Payer: Self-pay

## 2018-07-14 ENCOUNTER — Ambulatory Visit: Payer: Self-pay

## 2018-07-14 DIAGNOSIS — Z853 Personal history of malignant neoplasm of breast: Secondary | ICD-10-CM | POA: Insufficient documentation

## 2018-07-14 DIAGNOSIS — R200 Anesthesia of skin: Secondary | ICD-10-CM | POA: Diagnosis not present

## 2018-07-14 DIAGNOSIS — W228XXA Striking against or struck by other objects, initial encounter: Secondary | ICD-10-CM | POA: Insufficient documentation

## 2018-07-14 DIAGNOSIS — S0003XA Contusion of scalp, initial encounter: Secondary | ICD-10-CM | POA: Insufficient documentation

## 2018-07-14 DIAGNOSIS — Z87891 Personal history of nicotine dependence: Secondary | ICD-10-CM | POA: Insufficient documentation

## 2018-07-14 DIAGNOSIS — Y92009 Unspecified place in unspecified non-institutional (private) residence as the place of occurrence of the external cause: Secondary | ICD-10-CM | POA: Insufficient documentation

## 2018-07-14 DIAGNOSIS — S0990XA Unspecified injury of head, initial encounter: Secondary | ICD-10-CM | POA: Diagnosis not present

## 2018-07-14 DIAGNOSIS — Z923 Personal history of irradiation: Secondary | ICD-10-CM | POA: Diagnosis not present

## 2018-07-14 DIAGNOSIS — Z79899 Other long term (current) drug therapy: Secondary | ICD-10-CM | POA: Insufficient documentation

## 2018-07-14 DIAGNOSIS — M542 Cervicalgia: Secondary | ICD-10-CM | POA: Diagnosis not present

## 2018-07-14 DIAGNOSIS — Y93E9 Activity, other interior property and clothing maintenance: Secondary | ICD-10-CM | POA: Insufficient documentation

## 2018-07-14 DIAGNOSIS — R911 Solitary pulmonary nodule: Secondary | ICD-10-CM | POA: Diagnosis not present

## 2018-07-14 DIAGNOSIS — Y999 Unspecified external cause status: Secondary | ICD-10-CM | POA: Diagnosis not present

## 2018-07-14 DIAGNOSIS — S199XXA Unspecified injury of neck, initial encounter: Secondary | ICD-10-CM | POA: Diagnosis not present

## 2018-07-14 DIAGNOSIS — R51 Headache: Secondary | ICD-10-CM | POA: Diagnosis not present

## 2018-07-14 DIAGNOSIS — Z7982 Long term (current) use of aspirin: Secondary | ICD-10-CM | POA: Insufficient documentation

## 2018-07-14 MED ORDER — ACETAMINOPHEN 325 MG PO TABS
650.0000 mg | ORAL_TABLET | Freq: Once | ORAL | Status: DC
  Filled 2018-07-14: qty 2

## 2018-07-14 NOTE — ED Notes (Signed)
Bed: WA24 Expected date:  Expected time:  Means of arrival:  Comments: Triage 2 

## 2018-07-14 NOTE — Discharge Instructions (Addendum)
You have been diagnosed today with Head Injury.  At this time there does not appear to be the presence of an emergent medical condition, however there is always the potential for conditions to change. Please read and follow the below instructions.  Please return to the Emergency Department immediately for any new or worsening symptoms. Please be sure to follow up with your Primary Care Provider within one week regarding your visit today; please call their office to schedule an appointment even if you are feeling better for a follow-up visit. Your CT scan today showed a nodule in your right lung.  We recommend that you have a CT scan to follow-up on this nodule.  Please discuss this with your primary care provider this week to have the scan scheduled.  Get help right away if: You have: A very bad (severe) headache that is not helped by medicine. Trouble walking or weakness in your arms and legs. Clear or bloody fluid coming from your nose or ears. Changes in your seeing (vision). Jerky movements that you cannot control (seizure). You throw up (vomit). Your symptoms get worse. You have vision changes You lose balance. Your speech is slurred. You pass out. You are sleepier and have trouble staying awake. The black centers of your eyes (pupils) change in size. Any new or worsening symptoms  Please read the additional information packets attached to your discharge summary.

## 2018-07-14 NOTE — Telephone Encounter (Signed)
Pt. Called to report recent head injury, approx. 45 min. Prior to calling the office.  Reported a wooden light fixture cover fell about 2-2.5 feet, striking the right side of head, above the ear.  Reported there is no cut or bleeding.  Described a swollen area about the size of a lemon.  Stated there is "tingling in site that is radiating outward toward the right eye and sinus region."  Denied any vision change.  Stated she did not feel faint at time of injury.  Stated she feels a little anxious about the injury.  C/o stiff neck from the reaction to the injury.  Stated the pain in 1/10 at this time.  Denied any nausea or vomiting.  Pt. alert and able to articulate her symptoms in detail.  Advised to go to ER for further evaluation.  Care advice given per protocol.  Pt. Verb. Understanding.   Agreed with plan.      Reason for Disposition . Large swelling or bruise > 2 inches (5 cm)  Answer Assessment - Initial Assessment Questions 1. MECHANISM: "How did the injury happen?" For falls, ask: "What height did you fall from?" and "What surface did you fall against?"      Wooden cover to light fixture fell about 2-2.5 feet and hit her right side of head, above the ear  2. ONSET: "When did the injury happen?" (Minutes or hours ago)     About 45 min. Ago  3. NEUROLOGIC SYMPTOMS: "Was there any loss of consciousness?" "Are there any other neurological symptoms?"     Vision okay, denied being light headed; c/o feeling some anxiety about the injury; c/o feels "tingling in her right eye and sinus area", c/o stiff neck  4. MENTAL STATUS: "Does the person know who he is, who you are, and where he is?"      alert 5. LOCATION: "What part of the head was hit?"      Right side of head  6. SCALP APPEARANCE: "What does the scalp look like? Is it bleeding now?" If so, ask: "Is it difficult to stop?"      Feels raised area about "size of lemon" 7. SIZE: For cuts, bruises, or swelling, ask: "How large is it?" (e.g., inches  or centimeters)      Size of lemon; no blood noted 8. PAIN: "Is there any pain?" If so, ask: "How bad is it?"  (e.g., Scale 1-10; or mild, moderate, severe)     1/10 9. TETANUS: For any breaks in the skin, ask: "When was the last tetanus booster?"     N/a  10. OTHER SYMPTOMS: "Do you have any other symptoms?" (e.g., neck pain, vomiting)       Denied nausea ; feels tingling around right eye and sinus area  11. PREGNANCY: "Is there any chance you are pregnant?" "When was your last menstrual period?"       N/a  Protocols used: HEAD INJURY-A-AH

## 2018-07-14 NOTE — ED Triage Notes (Signed)
Patient states that she is here because of a head injury.   Patient was working with handyman on replacing a flourecent light and they thought it was in but it was not and it swung down and hit her in the side of her head at 1600.   She normally takes 40-mg of Krestor and 2 Baby aspirins today   Patient states that "there was no blood that we know of"   Knot noted by this RN at this time on the right side of head.  Patient states that there is tingling in her head that goes down to her nose and eyes. She also has a sore neck that she thinks is from the whiplash of the injury.

## 2018-07-14 NOTE — ED Provider Notes (Signed)
[facility] DEPT Provider Note   CSN: [id number] Arrival date & time: 07/14/18  1732    History   Chief Complaint No chief complaint on file.   HPI Lindsey Barrett is a 73 y.o. female presented today with head injury that occurred at 4 PM.  Patient states that she was assisting her handyman with putting up a light fixture when the fixture fell striking her on the right parietal scalp.  Patient denies fall, loss of consciousness.  She is able to maintain her balance throughout the event.  Patient states that she has had continued right parietal scalp pain since that time additionally with a an occipital headache mild intensity throbbing without aggravating or relieving factors.  Patient has not had medicine prior to arrival.  Patient also endorses soreness to her neck.  Patient reports taking 2 baby aspirin daily.  No other blood thinner use.  Patient denies loss of consciousness, vision changes, nausea/vomiting, numbness/tingling or weakness of the extremities, chest pain, abdominal pain, saddle paresthesias, bowel/bladder incontinence or urinary retention.  She does endorse a tingling sensation around where the light fixture struck her, she denies numbness.    HPI  Past Medical History:  Diagnosis Date  . Anxiety   . Atrophic vaginitis   . Breast cancer ([place])   . 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[place]) 05/20/2015  . Breast cyst    x3  . DDD (degenerative disc disease)   . Diverticulosis   . Fasting hyperglycemia   . GERD (gastroesophageal reflux disease)   . Gilbert syndrome   . Glaucoma   . History of hiatal hernia   . HSV infection   . Hyperlipidemia   . Radiation    left breast 50.4 gray  . STD (sexually transmitted disease)    HSV    Patient Active Problem List   Diagnosis Date Noted  . Dupuytren's contracture of both hands 07/11/2018  . Ganglion cyst of volar aspect of left wrist 06/04/2018  . Frozen  shoulder 03/18/2018  . Epistaxis 02/05/2018  . Right rotator cuff tear 10/15/2017  . Axillary lymphadenopathy 02/26/2017  . Tinnitus of both ears 01/01/2017  . Reaction to internal stress 05/29/2016  . Prediabetes 08/10/2015  . 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[place]) 05/20/2015  . Open-angle glaucoma 06/17/2014  . Vitamin D deficiency 10/18/2011  . HSV infection   . Atrophic vaginitis   . VARICOSE VEINS, LOWER EXTREMITIES 09/28/2009  . GERD 07/14/2009  . Hyperlipidemia 12/26/2006  . GILBERT'S SYNDROME 08/09/2006  . [place] DISEASE 08/09/2006    Past Surgical History:  Procedure Laterality Date  . APPENDECTOMY  1961  . BREAST CYST ASPIRATION     x2 right ,x1 left  . COLONOSCOPY  2007   neg  . DILATION AND CURETTAGE OF UTERUS  1975  . FLEXIBLE SIGMOIDOSCOPY     x2  . PELVIC LAPAROSCOPY    . RADIOACTIVE SEED GUIDED PARTIAL MASTECTOMY WITH AXILLARY SENTINEL LYMPH NODE BIOPSY Left 05/31/2015   Procedure: LEFT BREAST RADIOACTIVE SEED GUIDED LUMPECTOMY WITH LEFT AXILLARY SENTINEL LYMPH NODE BIOPSY;  Surgeon: [name], MD;  Location: [facility];  Service: General;  Laterality: Left;  . RE-EXCISION OF BREAST LUMPECTOMY Left 07/01/2015   Procedure: RE-EXCISION OF LEFT BREAST INFERIOR MARGIN;  Surgeon: [name], MD;  Location: [facility];  Service: General;  Laterality: Left;  . UPPER GASTROINTESTINAL ENDOSCOPY    . UPPER GI ENDOSCOPY  10/2010   Dr [name]  . WISDOM TOOTH EXTRACTION  1978     OB History    Gravida  0   Para      Term      Preterm      AB      Living        SAB      TAB      Ectopic      Multiple      Live Births               Home Medications    Prior to Admission medications   Medication Sig Start Date End Date Taking? Authorizing Provider  aspirin 81 MG tablet Take 81 mg by mouth 2 (two) times daily.    Yes [provider]  bimatoprost (LUMIGAN)  0.01 % SOLN Place 1 drop into both eyes at bedtime.   Yes [provider]  brinzolamide (AZOPT) 1 % ophthalmic suspension 1 drop 2 (two) times daily.   Yes [provider]  calcium carbonate (TUMS - DOSED IN MG ELEMENTAL CALCIUM) 500 MG chewable tablet Chew 1 tablet by mouth as needed for indigestion or heartburn (1000 mg).    Yes [provider]  Cholecalciferol (VITAMIN D3 PO) Take 1 tablet by mouth daily.   Yes [provider]  famotidine (PEPCID) 20 MG tablet TAKE 1 TABLET BY MOUTH TWICE DAILY BEFORE BREAKFAST AND  AT  [facility] Patient taking differently: Take 20 mg by mouth 2 (two) times daily.  06/23/18  Yes Esterwood, Amy S, PA-C  hydroxypropyl methylcellulose / hypromellose (ISOPTO TEARS / GONIOVISC) 2.5 % ophthalmic solution Place 1 drop into both eyes 4 (four) times daily as needed for dry eyes.   Yes [provider]  letrozole (FEMARA) 2.5 MG tablet Take 1 tablet (2.5 mg total) by mouth daily. 06/02/18  Yes Nicholas Lose, MD  rosuvastatin (CRESTOR) 40 MG tablet TAKE 1 TABLET BY MOUTH ONCE DAILY Patient taking differently: Take 40 mg by mouth daily.  01/13/18  Yes Burns, Claudina Lick, MD  amoxicillin-clavulanate (AUGMENTIN) 875-125 MG tablet Take 1 tablet by mouth 2 (two) times daily. 07/11/18   [name], MD  nitrofurantoin, macrocrystal-monohydrate, (MACROBID) 100 MG capsule Take 1 capsule (100 mg total) by mouth 2 (two) times daily. 07/11/18   [name], MD  metoprolol tartrate (LOPRESSOR) 25 MG tablet Take 1 tablet (25 mg total) by mouth as directed. 1 pill every 8 hours as needed for tachycardia or palpitations. 01/02/11 07/12/11  Hendricks Limes, MD    Family History Family History  Problem Relation Age of Onset  . Lung cancer Mother        smoker  . Endometriosis Mother   . Cancer Mother        Bladder cancer  . Stomach cancer Maternal Grandmother   . Coronary artery disease Father   . Cancer Father        intestinal carcinoid tumors    . Sudden death Sister        48 week old, congenital deformity  . Liver cancer Maternal Uncle   . Stroke Paternal Grandfather 60  . Leukemia Maternal Uncle   . Breast cancer Paternal Aunt        great paternal aunt-Age 72's  . Colon cancer Paternal Aunt        lived to 50; pat great aunt  . Vascular Disease Paternal Grandmother        thoracic aneurysm  .  Esophageal cancer Neg Hx   . Rectal cancer Neg Hx   . Pancreatic cancer Neg Hx     Social History Social History   Tobacco Use  . Smoking status: Former Smoker    Types: Cigarettes    Last attempt to quit: 03/28/1999    Years since quitting: 19.3  . Smokeless tobacco: Never Used  . Tobacco comment: smoked 1967-2000, up to 3/4 ppd  Substance Use Topics  . Alcohol use: Yes    Alcohol/week: 2.0 standard drinks    Types: 2 Cans of beer per week  . Drug use: No     Allergies   Arimidex [anastrozole]; Septra ds [sulfamethoxazole-trimethoprim]; Sulfa antibiotics; and Adhesive [tape]   Review of Systems Review of Systems  Constitutional: Negative.  Negative for chills and fever.  Eyes: Negative.  Negative for visual disturbance.  Respiratory: Negative.  Negative for shortness of breath.   Cardiovascular: Negative.  Negative for chest pain.  Gastrointestinal: Negative.  Negative for abdominal pain, nausea and vomiting.  Musculoskeletal: Positive for neck pain. Negative for back pain.  Neurological: Positive for numbness (Tingling of parietal scalp.) and headaches. Negative for dizziness, syncope, speech difficulty and weakness.  All other systems reviewed and are negative.  Physical Exam Updated Vital Signs BP 140/74   Pulse 77   Temp 98.2 F (36.8 C) (Oral)   Resp 18   Ht 5\' 5"  (1.651 m)   Wt 79.4 kg   SpO2 99%   BMI 29.12 kg/m   Physical Exam Constitutional:      General: She is not in acute distress.    Appearance: Normal appearance. She is well-developed. She is not ill-appearing or diaphoretic.  HENT:      Head: Normocephalic and atraumatic. No raccoon eyes, Battle's sign, abrasion or contusion.     Jaw: There is normal jaw occlusion. No trismus.      Comments: Small hematoma of right parietal scalp, skin intact.    Right Ear: Tympanic membrane, ear canal and external ear normal. No hemotympanum.     Left Ear: Tympanic membrane, ear canal and external ear normal. No hemotympanum.     Ears:     Comments: Hearing grossly intact bilaterally    Nose: Nose normal. No rhinorrhea.     Right Nostril: No epistaxis.     Left Nostril: No epistaxis.     Mouth/Throat:     Lips: Pink.     Mouth: Mucous membranes are moist.     Pharynx: Oropharynx is clear. Uvula midline.  Eyes:     General: Vision grossly intact. Gaze aligned appropriately.     Extraocular Movements: Extraocular movements intact.     Conjunctiva/sclera: Conjunctivae normal.     Pupils: Pupils are equal, round, and reactive to light.     Comments: Visual fields grossly intact bilaterally  Neck:     Musculoskeletal: Full passive range of motion without pain, normal range of motion and neck supple. No neck rigidity.     Trachea: Trachea and phonation normal. No tracheal tenderness or tracheal deviation.     Meningeal: Brudzinski's sign and Kernig's sign absent.  Cardiovascular:     Rate and Rhythm: Normal rate and regular rhythm.     Pulses:          Dorsalis pedis pulses are 2+ on the right side and 2+ on the left side.       Posterior tibial pulses are 2+ on the right side and 2+ on the left side.  Heart sounds: Normal heart sounds.  Pulmonary:     Effort: Pulmonary effort is normal. No respiratory distress.     Breath sounds: Normal breath sounds and air entry.  Chest:     Chest wall: No tenderness.  Abdominal:     General: Bowel sounds are normal. There is no distension.     Palpations: Abdomen is soft.     Tenderness: There is no abdominal tenderness. There is no guarding or rebound.  Musculoskeletal:     Comments:  No midline C/T/L spinal tenderness to palpation, no paraspinal muscle tenderness, no deformity, crepitus, or step-off noted. No sign of injury to the neck or back.  Feet:     Right foot:     Protective Sensation: 3 sites tested. 3 sites sensed.     Left foot:     Protective Sensation: 3 sites tested. 3 sites sensed.  Skin:    General: Skin is warm and dry.  Neurological:     Mental Status: She is alert and oriented to person, place, and time.     GCS: GCS eye subscore is 4. GCS verbal subscore is 5. GCS motor subscore is 6.     Comments: Mental Status: Alert, oriented, thought content appropriate, able to give a coherent history. Speech fluent without evidence of aphasia. Able to follow 2 step commands without difficulty. Cranial Nerves: II: Peripheral visual fields grossly normal, pupils equal, round, reactive to light III,IV, VI: ptosis not present, extra-ocular motions intact bilaterally V,VII: smile symmetric, eyebrows raise symmetric, facial light touch sensation equal VIII: hearing grossly normal to voice X: uvula elevates symmetrically XI: bilateral shoulder shrug symmetric and strong XII: midline tongue extension without fassiculations Motor: Normal tone. 5/5 strength in upper and lower extremities bilaterally including strong and equal grip strength and dorsiflexion/plantar flexion Sensory: Sensation intact to light touch in all extremities.Negative Romberg.  Deep Tendon Reflexes: 2+ and symmetric in the biceps and patella. No clonus of the feet. Cerebellar: normal finger-to-nose maze with bilateral upper extremities. Normal heel-to -shin balance bilaterally of the lower extremity. No pronator drift.  Gait: normal gait and balance CV: distal pulses palpable throughout  Psychiatric:        Mood and Affect: Mood normal.        Behavior: Behavior is cooperative.    ED Treatments / Results  Labs (all labs ordered are listed, but only abnormal results are  displayed) Labs Reviewed - No data to display  EKG None  Radiology Ct Head Wo Contrast  Result Date: 07/14/2018 CLINICAL DATA:  Patient was struck in the right side of the head with a fluorescent light. Right head and neck pain. Tingling. EXAM: CT HEAD WITHOUT CONTRAST CT CERVICAL SPINE WITHOUT CONTRAST TECHNIQUE: Multidetector CT imaging of the head and cervical spine was performed following the standard protocol without intravenous contrast. Multiplanar CT image reconstructions of the cervical spine were also generated. COMPARISON:  CT head 04/13/2010 FINDINGS: CT HEAD FINDINGS Brain: No evidence of acute infarction, hemorrhage, hydrocephalus, extra-axial collection or mass lesion/mass effect. Mild cerebral atrophy. Vascular: No hyperdense vessel or unexpected calcification. Skull: Calvarium appears intact. No acute depressed skull fractures. Sinuses/Orbits: Paranasal sinuses and mastoid air cells are clear. Other: None. CT CERVICAL SPINE FINDINGS Alignment: Normal alignment of the cervical vertebrae and facet joints. C1-2 articulation appears intact. Skull base and vertebrae: Skull base appears intact. No vertebral compression deformities. No focal bone lesion or bone destruction. Bone cortex appears intact. Soft tissues and spinal canal: No prevertebral soft tissue  swelling. No abnormal paraspinal soft tissue mass or infiltration. Disc levels: Degenerative changes in the cervical spine with narrowed disc spaces and endplate hypertrophic changes at C4-5 and C5-6. Degenerative changes throughout the facet joints. Upper chest: There is a 7 mm circumscribed noncalcified soft tissue nodule in the right lung apex. This could represent a metastatic lesion. Suggest follow-up with chest CT to look for additional lesions. Other: None. IMPRESSION: 1. No acute intracranial abnormalities. Mild cerebral atrophy. 2. Normal alignment of the cervical spine. Mild degenerative changes. No acute displaced fractures  identified. 3. 7 mm circumscribed nodule in the right lung apex. This could represent a metastatic lesion. Suggest follow-up with chest CT. Electronically Signed   By: Lucienne Capers M.D.   On: 07/14/2018 20:21   Ct Cervical Spine Wo Contrast  Result Date: 07/14/2018 CLINICAL DATA:  Patient was struck in the right side of the head with a fluorescent light. Right head and neck pain. Tingling. EXAM: CT HEAD WITHOUT CONTRAST CT CERVICAL SPINE WITHOUT CONTRAST TECHNIQUE: Multidetector CT imaging of the head and cervical spine was performed following the standard protocol without intravenous contrast. Multiplanar CT image reconstructions of the cervical spine were also generated. COMPARISON:  CT head 04/13/2010 FINDINGS: CT HEAD FINDINGS Brain: No evidence of acute infarction, hemorrhage, hydrocephalus, extra-axial collection or mass lesion/mass effect. Mild cerebral atrophy. Vascular: No hyperdense vessel or unexpected calcification. Skull: Calvarium appears intact. No acute depressed skull fractures. Sinuses/Orbits: Paranasal sinuses and mastoid air cells are clear. Other: None. CT CERVICAL SPINE FINDINGS Alignment: Normal alignment of the cervical vertebrae and facet joints. C1-2 articulation appears intact. Skull base and vertebrae: Skull base appears intact. No vertebral compression deformities. No focal bone lesion or bone destruction. Bone cortex appears intact. Soft tissues and spinal canal: No prevertebral soft tissue swelling. No abnormal paraspinal soft tissue mass or infiltration. Disc levels: Degenerative changes in the cervical spine with narrowed disc spaces and endplate hypertrophic changes at C4-5 and C5-6. Degenerative changes throughout the facet joints. Upper chest: There is a 7 mm circumscribed noncalcified soft tissue nodule in the right lung apex. This could represent a metastatic lesion. Suggest follow-up with chest CT to look for additional lesions. Other: None. IMPRESSION: 1. No acute  intracranial abnormalities. Mild cerebral atrophy. 2. Normal alignment of the cervical spine. Mild degenerative changes. No acute displaced fractures identified. 3. 7 mm circumscribed nodule in the right lung apex. This could represent a metastatic lesion. Suggest follow-up with chest CT. Electronically Signed   By: Lucienne Capers M.D.   On: 07/14/2018 20:21    Procedures Procedures (including critical care time)  Medications Ordered in ED Medications  acetaminophen (TYLENOL) tablet 650 mg (650 mg Oral Refused 07/14/18 2026)     Initial Impression / Assessment and Plan / ED Course  I have reviewed the triage vital signs and the nursing notes.  Pertinent labs & imaging results that were available during my care of the patient were reviewed by me and considered in my medical decision making (see chart for details).    CT head/cervical spine: IMPRESSION:  1. No acute intracranial abnormalities. Mild cerebral atrophy.  2. Normal alignment of the cervical spine. Mild degenerative  changes. No acute displaced fractures identified.  3. 7 mm circumscribed nodule in the right lung apex. This could  represent a metastatic lesion. Suggest follow-up with chest CT.   Patient informed of incidental finding of lung nodule.  Encouraged PCP follow-up this week to schedule CT chest.  Patient seen and evaluated  by Dr. Eulis Foster agrees with discharge at this time.  At this time there does not appear to be any evidence of an acute emergency medical condition and the patient appears stable for discharge with appropriate outpatient follow up. Diagnosis was discussed with patient who verbalizes understanding of care plan and is agreeable to discharge. I have discussed return precautions with patient who verbalizes understanding of return precautions. Patient strongly encouraged to follow-up with their PCP. All questions answered.  Patient's been discharged in good condition.   Note: Portions of this report may  have been transcribed using voice recognition software. Every effort was made to ensure accuracy; however, inadvertent computerized transcription errors may still be present. Final Clinical Impressions(s) / ED Diagnoses   Final diagnoses:  Injury of head, initial encounter    ED Discharge Orders    None       Gari Crown 07/14/18 2237    Daleen Bo, MD 07/16/18 1008

## 2018-07-14 NOTE — ED Provider Notes (Signed)
  Face-to-face evaluation   History: She is here for evaluation of injury to head when a light fixture dropped onto it.  She has vague pain on her head, neck and face since then.  She denies blurred vision, nausea or vomiting.  She has no trouble walking.  Physical exam: Alert elderly female, who is comfortable.  Head with small contusion right parietal no associated crepitation or abrasion.  Neck has normal range of motion.  She is alert and oriented x3.  She moves all extremities equally.   Medical screening examination/treatment/procedure(s) were conducted as a shared visit with non-physician practitioner(s) and myself.  I personally evaluated the patient during the encounter    Daleen Bo, MD 07/16/18 1009

## 2018-07-15 ENCOUNTER — Telehealth: Payer: Self-pay | Admitting: Internal Medicine

## 2018-07-15 DIAGNOSIS — R918 Other nonspecific abnormal finding of lung field: Secondary | ICD-10-CM

## 2018-07-15 NOTE — Telephone Encounter (Signed)
CT ordered. 

## 2018-07-15 NOTE — Telephone Encounter (Signed)
Pt aware.

## 2018-07-15 NOTE — Addendum Note (Signed)
Addended by: Delice Bison E on: 07/15/2018 01:40 PM   Modules accepted: Orders

## 2018-07-15 NOTE — Telephone Encounter (Signed)
Copied from [place] [id number]. Topic: General - Other >> Jul 15, 2018 11:05 AM Keene Breath wrote: Reason for CRM: Patient called to inform the doctor that she had gone to the ED and had a CT of her head.  After the CT the doctors said that they found something else and wanted her to contact her PCP to have another CT done.  Please advise and send an order for a CT as soon as possible.  CB# [phone]

## 2018-07-16 ENCOUNTER — Other Ambulatory Visit: Payer: Self-pay | Admitting: Internal Medicine

## 2018-07-16 ENCOUNTER — Other Ambulatory Visit (INDEPENDENT_AMBULATORY_CARE_PROVIDER_SITE_OTHER): Payer: Medicare Other

## 2018-07-16 DIAGNOSIS — R918 Other nonspecific abnormal finding of lung field: Secondary | ICD-10-CM

## 2018-07-16 DIAGNOSIS — R7303 Prediabetes: Secondary | ICD-10-CM

## 2018-07-16 LAB — BASIC METABOLIC PANEL
BUN: 12 mg/dL (ref 6–23)
CO2: 30 mEq/L (ref 19–32)
Calcium: 9.7 mg/dL (ref 8.4–10.5)
Chloride: 103 mEq/L (ref 96–112)
Creatinine, Ser: 0.64 mg/dL (ref 0.40–1.20)
GFR: 91.46 mL/min (ref >60.00)
Glucose, Bld: 117 mg/dL — ABNORMAL HIGH (ref 70–99)
Potassium: 4 mEq/L (ref 3.5–5.1)
SODIUM: 141 meq/L (ref 135–145)

## 2018-07-17 ENCOUNTER — Ambulatory Visit
Admission: RE | Admit: 2018-07-17 | Discharge: 2018-07-17 | Disposition: A | Discharge status: Home / Self Care | Payer: Medicare Other | Source: Ambulatory Visit | Attending: Internal Medicine | Admitting: Internal Medicine

## 2018-07-17 ENCOUNTER — Telehealth: Payer: Self-pay

## 2018-07-17 ENCOUNTER — Encounter: Payer: Self-pay | Admitting: Internal Medicine

## 2018-07-17 ENCOUNTER — Other Ambulatory Visit: Payer: Self-pay

## 2018-07-17 DIAGNOSIS — R911 Solitary pulmonary nodule: Secondary | ICD-10-CM | POA: Diagnosis not present

## 2018-07-17 DIAGNOSIS — R918 Other nonspecific abnormal finding of lung field: Secondary | ICD-10-CM

## 2018-07-17 MED ORDER — IOPAMIDOL (ISOVUE-300) INJECTION 61%
75.0000 mL | Freq: Once | INTRAVENOUS | Status: AC | PRN
  Administered 2018-07-17: 75 mL via INTRAVENOUS

## 2018-07-18 ENCOUNTER — Other Ambulatory Visit: Payer: Self-pay | Admitting: Internal Medicine

## 2018-07-18 DIAGNOSIS — R918 Other nonspecific abnormal finding of lung field: Secondary | ICD-10-CM

## 2018-07-22 ENCOUNTER — Other Ambulatory Visit: Payer: Medicare Other

## 2018-07-23 ENCOUNTER — Encounter: Payer: Self-pay | Admitting: Internal Medicine

## 2018-07-23 NOTE — Telephone Encounter (Signed)
Burns pt

## 2018-07-29 ENCOUNTER — Ambulatory Visit (HOSPITAL_COMMUNITY)
Admission: RE | Admit: 2018-07-29 | Discharge: 2018-07-29 | Disposition: A | Discharge status: Home / Self Care | Payer: Medicare Other | Source: Ambulatory Visit | Attending: Internal Medicine | Admitting: Internal Medicine

## 2018-07-29 ENCOUNTER — Other Ambulatory Visit: Payer: Self-pay

## 2018-07-29 ENCOUNTER — Encounter: Payer: Self-pay | Admitting: Internal Medicine

## 2018-07-29 ENCOUNTER — Other Ambulatory Visit: Payer: Self-pay | Admitting: Internal Medicine

## 2018-07-29 DIAGNOSIS — R911 Solitary pulmonary nodule: Secondary | ICD-10-CM

## 2018-07-29 DIAGNOSIS — R918 Other nonspecific abnormal finding of lung field: Secondary | ICD-10-CM | POA: Insufficient documentation

## 2018-07-29 DIAGNOSIS — Z853 Personal history of malignant neoplasm of breast: Secondary | ICD-10-CM | POA: Diagnosis not present

## 2018-07-29 LAB — GLUCOSE, CAPILLARY: Glucose-Capillary: 102 mg/dL — ABNORMAL HIGH (ref 70–99)

## 2018-07-29 MED ORDER — FLUDEOXYGLUCOSE F - 18 (FDG) INJECTION
9.8700 | Freq: Once | INTRAVENOUS | Status: AC | PRN
  Administered 2018-07-29: 9.87 via INTRAVENOUS

## 2018-08-08 NOTE — Telephone Encounter (Signed)
Error

## 2018-08-25 ENCOUNTER — Encounter: Payer: Self-pay | Admitting: Internal Medicine

## 2018-08-25 ENCOUNTER — Other Ambulatory Visit: Payer: Self-pay

## 2018-08-25 ENCOUNTER — Ambulatory Visit (INDEPENDENT_AMBULATORY_CARE_PROVIDER_SITE_OTHER): Payer: Medicare Other | Admitting: Internal Medicine

## 2018-08-25 VITALS — BP 120/74 | HR 85 | Ht 64.5 in | Wt 169.8 lb

## 2018-08-25 DIAGNOSIS — R911 Solitary pulmonary nodule: Secondary | ICD-10-CM

## 2018-08-25 NOTE — Progress Notes (Signed)
Lindsey Barrett, female    DOB: [birth year],    MRN: [id number]   Brief patient profile:  13 yowf quit smoking 2000  with h/o  L breast Ca 2017 s/p lumpectomy for Stage 1A with ER pos rx RT and letrozole 11/09/15 for 5 years planned with incidental dx of R apical 8 mm nodule when had ct head and neck p accident at home 07/14/2018 with w/u with Chest CT/PET and referred to pulmonary clinic 08/25/2018 by Dr  Billey Gosling with indeterminate results.     History of Present Illness  08/25/2018  Pulmonary/ 1st office eval/Lindsey Barrett  Chief Complaint  Patient presents with   Consult    Consult AT:[facility] Nodule. PET scan 07/29/18. She states she was fixing a light fixture and it fell and hit her on the head, she was directed to go to ER for CT scan and the nodule was found incidentally. Denies having any symptoms.   Dyspnea:  Not limited by breathing from desired activities  /works out at y when it's open Cough: none Sleep: no resp symptoms SABA use: none   No obvious day to day or daytime variability or assoc excess/ purulent sputum or mucus plugs or hemoptysis or cp or chest tightness, subjective wheeze or overt sinus or hb symptoms.   sleeps without nocturnal  or early am exacerbation  of respiratory  c/o's or need for noct saba. Also denies any obvious fluctuation of symptoms with weather or environmental changes or other aggravating or alleviating factors except as outlined above   No unusual exposure hx or h/o childhood pna/ asthma or knowledge of premature birth.  Current Allergies, Complete Past Medical History, Past Surgical History, Family History, and Social History were reviewed in [employer] record.  ROS  The following are not active complaints unless bolded Hoarseness, sore throat, dysphagia, dental problems, itching, sneezing,  nasal congestion or discharge of excess mucus or purulent secretions, ear ache,   fever, chills, sweats, unintended wt loss or wt gain,  classically pleuritic or exertional cp,  orthopnea pnd or arm/hand swelling  or leg swelling, presyncope, palpitations, abdominal pain, anorexia, nausea, vomiting, diarrhea  or change in bowel habits or change in bladder habits, change in stools or change in urine, dysuria, hematuria,  rash, arthralgias, visual complaints, headache, numbness, weakness or ataxia or problems with walking or coordination,  change in mood= anxious  or  memory.           Past Medical History:  Diagnosis Date   Anxiety    Atrophic vaginitis    Breast cancer ([place])    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[place]) 05/20/2015   Breast cyst    x3   DDD (degenerative disc disease)    Diverticulosis    Fasting hyperglycemia    GERD (gastroesophageal reflux disease)    [name] syndrome    Glaucoma    History of hiatal hernia    HSV infection    Hyperlipidemia    Radiation    left breast 50.4 gray   STD (sexually transmitted disease)    HSV    Outpatient Medications Prior to Visit  Medication Sig Dispense Refill   aspirin 81 MG tablet Take 81 mg by mouth 2 (two) times daily.      bimatoprost (LUMIGAN) 0.01 % SOLN Place 1 drop into both eyes at bedtime.     brinzolamide (AZOPT) 1 % ophthalmic suspension 1 drop 2 (two) times daily.  calcium carbonate (TUMS - DOSED IN MG ELEMENTAL CALCIUM) 500 MG chewable tablet Chew 1 tablet by mouth as needed for indigestion or heartburn (1000 mg).      Cholecalciferol (VITAMIN D3 PO) Take 1 tablet by mouth daily.     famotidine (PEPCID) 20 MG tablet TAKE 1 TABLET BY MOUTH TWICE DAILY BEFORE BREAKFAST AND  AT  DINNER (Patient taking differently: Take 20 mg by mouth 2 (two) times daily. ) 60 tablet 3   letrozole (FEMARA) 2.5 MG tablet Take 1 tablet (2.5 mg total) by mouth daily. 90 tablet 0   rosuvastatin (CRESTOR) 40 MG tablet TAKE 1 TABLET BY MOUTH ONCE DAILY (Patient taking differently: Take 40 mg by mouth daily. ) 90 tablet 2    Vitamin D, Ergocalciferol, (DRISDOL) 1.25 MG (50000 UT) CAPS capsule Take 50,000 Units by mouth daily.     hydroxypropyl methylcellulose / hypromellose (ISOPTO TEARS / GONIOVISC) 2.5 % ophthalmic solution Place 1 drop into both eyes 4 (four) times daily as needed for dry eyes.     amoxicillin-clavulanate (AUGMENTIN) 875-125 MG tablet Take 1 tablet by mouth 2 (two) times daily. (Patient not taking: Reported on 08/25/2018) 20 tablet 0   nitrofurantoin, macrocrystal-monohydrate, (MACROBID) 100 MG capsule Take 1 capsule (100 mg total) by mouth 2 (two) times daily. (Patient not taking: Reported on 08/25/2018) 14 capsule 0      Objective:     BP 120/74    Pulse 85    Ht 5' 4.5" (1.638 m)    Wt 169 lb 12.8 oz (77 kg)    SpO2 97%    BMI 28.70 kg/m   SpO2: 97 %  RA   Wt Readings from Last 3 Encounters:  08/25/18 169 lb 12.8 oz (77 kg)  07/14/18 175 lb (79.4 kg)  07/11/18 175 lb (79.4 kg)      amb very intelligent/ somewhat anxious  wf nad   HEENT: nl dentition, turbinates bilaterally, and oropharynx. Nl external ear canals without cough reflex   NECK :  without JVD/Nodes/TM/ nl carotid upstrokes bilaterally   LUNGS: no acc muscle use,  Nl contour chest which is clear to A and P bilaterally without cough on insp or exp maneuvers   CV:  RRR  no s3 or murmur or increase in P2, and no edema   ABD:  soft and nontender with nl inspiratory excursion in the supine position. No bruits or organomegaly appreciated, bowel sounds nl  MS:  Nl gait/ ext warm without deformities, calf tenderness, cyanosis or clubbing No obvious joint restrictions   SKIN: warm and dry without lesions    NEURO:  alert, approp, nl sensorium with  no motor or cerebellar deficits apparent.       I personally reviewed images and agree with radiology impression as follows:   PET  0/98/1191 No hypermetabolic FDG accumulation in the 8 mm right apical nodule. Tiny size of this nodule makes lack of hypermetabolism  nonspecific and neoplasm has not been excluded        Assessment   Pulmonary nodule RUL 8 mm very smooth features original dx 07/14/2018 PET scan 4/78/2956-OZ hypermetabolic activity - Pulmonary eval 08/25/2018 rec f/u in one year then in 18-24 m after that by Fleischner criteria for low risk (> 15 y since smoker, breast ca met very unlikely    She is rightly concerned about her hx of smoking and breast ca and her fm hx of malignancies none of which were lung ca so she is low  but not "no risk" based on w/u to date.  CT results reviewed with pt >>> Too small for PET to be reliable or bx, not suspicious enough for excisional bx > really only option for now is follow the Fleischner society guidelines as rec by radiology which call for 6 months for high and 12 months for low and note also being followed by Dr Lindi Adie who may have other concerns re f/u for breast ca and will see him next month but my rec for now is to limit unnecessary rad exp/ expense and repeat the study in one year   >>>Discussed in detail all the  indications, usual  risks and alternatives  relative to the benefits with patient who agrees to proceed with one year scan/ placed in reminder file for 07/14/2019     Total time devoted to counseling  > 50 % of initial 60 min office visit:  review case with pt/ discussion of options/alternatives/ personally creating written customized instructions  in presence of pt  then going over those specific  Instructions directly with the pt including how to use all of the meds but in particular covering each new medication in detail and the difference between the maintenance= "automatic" meds and the prns using an action plan format for the latter (If this problem/symptom => do that organization reading Left to right).  Please see AVS from this visit for a full list of these instructions which I personally wrote for this pt and  are unique to this visit.       Christinia Gully, MD 08/25/2018

## 2018-08-25 NOTE — Patient Instructions (Addendum)
We will contact you in 07/2019 for follow up ct chest - call us in meantime if needed for new symptoms

## 2018-08-26 ENCOUNTER — Encounter: Payer: Self-pay | Admitting: Internal Medicine

## 2018-08-26 ENCOUNTER — Telehealth: Payer: Self-pay | Admitting: Internal Medicine

## 2018-08-26 NOTE — Assessment & Plan Note (Signed)
RUL 8 mm very smooth features original dx 07/14/2018 PET scan 5/44/9201-EO hypermetabolic activity - Pulmonary eval 08/25/2018 rec f/u in one year then in 18-24 m after that by Fleischner criteria for low risk (> 15 y since smoker, breast ca met very unlikely    She is rightly concerned about her hx of smoking and breast ca and her fm hx of malignancies none of which were lung ca so she is low but not "no risk" based on w/u to date.  CT results reviewed with pt >>> Too small for PET to be reliable or bx, not suspicious enough for excisional bx > really only option for now is follow the Fleischner society guidelines as rec by radiology which call for 6 months for high and 12 months for low and note also being followed by Dr Lindi Adie who may have other concerns re f/u for breast ca and will see him next month but my rec for now is to limit unnecessary rad exp/ expense and repeat the study in one year   >>>Discussed in detail all the  indications, usual  risks and alternatives  relative to the benefits with patient who agrees to proceed with one year scan/ placed in reminder file for 07/14/2019     Total time devoted to counseling  > 50 % of initial 60 min office visit:  review case with pt/ discussion of options/alternatives/ personally creating written customized instructions  in presence of pt  then going over those specific  Instructions directly with the pt including how to use all of the meds but in particular covering each new medication in detail and the difference between the maintenance= "automatic" meds and the prns using an action plan format for the latter (If this problem/symptom => do that organization reading Left to right).  Please see AVS from this visit for a full list of these instructions which I personally wrote for this pt and  are unique to this visit.

## 2018-08-26 NOTE — Telephone Encounter (Signed)
Pt reviewed med list after yesterday's visit. She needed a few things added and changed. Med list updated per patient request. Nothing further needed.

## 2018-09-02 ENCOUNTER — Other Ambulatory Visit: Payer: Self-pay | Admitting: *Deleted

## 2018-09-02 MED ORDER — LETROZOLE 2.5 MG PO TABS: 2.5000 mg | ORAL_TABLET | Freq: Every day | ORAL | 0 refills | Status: DC

## 2018-09-02 NOTE — Progress Notes (Signed)
[name] [address], [place] [address] Phone: [phone] Subjective:   I Lindsey Barrett am serving as a Education administrator for Dr. Hulan Saas.    CC: Right shoulder pain follow-up  OAC:[id number]   06/04/2018 Significant improvement overall.  Patient has been doing with it for nearly 3 months at this time.  Discussed icing regimen and home exercise.  Discussed which activities to do which wants to avoid.  Patient will slowly increase activity.  Follow-up again in 4 to 8 weeks.  Discussed with patient in great length.  Patient will monitor.  Worsening symptoms consider aspiration  4//29/2020 Lindsey Barrett is a 73 y.o. female coming in with complaint of right shoulder pain. States the shoulder isn't doing well. Would not like an injection or surgery. Finished PT at the end of February. Hasn't been exercising due to life changes. Lateral shoulder pain. Pain all the time. Limited ROM. Believes it may have something to do with the muscle or tendon.     Past Medical History:  Diagnosis Date  . Anxiety   . Atrophic vaginitis   . Breast cancer ([place])   . 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[place]) 05/20/2015  . Breast cyst    x3  . DDD (degenerative disc disease)   . Diverticulosis   . Fasting hyperglycemia   . GERD (gastroesophageal reflux disease)   . Gilbert syndrome   . Glaucoma   . History of hiatal hernia   . HSV infection   . Hyperlipidemia   . Radiation    left breast 50.4 gray  . STD (sexually transmitted disease)    HSV   Past Surgical History:  Procedure Laterality Date  . APPENDECTOMY  1961  . BREAST CYST ASPIRATION     x2 right ,x1 left  . COLONOSCOPY  2007   neg  . DILATION AND CURETTAGE OF UTERUS  1975  . FLEXIBLE SIGMOIDOSCOPY     x2  . PELVIC LAPAROSCOPY    . RADIOACTIVE SEED GUIDED PARTIAL MASTECTOMY WITH AXILLARY SENTINEL LYMPH NODE BIOPSY Left 05/31/2015   Procedure: LEFT BREAST RADIOACTIVE  SEED GUIDED LUMPECTOMY WITH LEFT AXILLARY SENTINEL LYMPH NODE BIOPSY;  Surgeon: [name], MD;  Location: [facility];  Service: General;  Laterality: Left;  . RE-EXCISION OF BREAST LUMPECTOMY Left 07/01/2015   Procedure: RE-EXCISION OF LEFT BREAST INFERIOR MARGIN;  Surgeon: [name], MD;  Location: [facility];  Service: General;  Laterality: Left;  . UPPER GASTROINTESTINAL ENDOSCOPY    . UPPER GI ENDOSCOPY  10/2010   Dr [name]  . WISDOM TOOTH EXTRACTION  1978   Social History   Socioeconomic History  . Marital status: Divorced    Spouse name: Not on file  . Number of children: 0  . Years of education: Not on file  . Highest education level: Not on file  Occupational History  . Occupation: Environmental health practitioner for [employer]  . Financial resource strain: Not hard at all  . Food insecurity:    Worry: Never true    Inability: Never true  . Transportation needs:    Medical: No    Non-medical: No  Tobacco Use  . Smoking status: Former Smoker    Types: Cigarettes    Last attempt to quit: 03/28/1999    Years since quitting: 19.4  . Smokeless tobacco: Never Used  . Tobacco comment: smoked 1967-2000, up to 3/4 ppd  Substance and Sexual Activity  .  Alcohol use: Yes    Alcohol/week: 2.0 standard drinks    Types: 2 Cans of beer per week  . Drug use: No  . Sexual activity: Never    Birth control/protection: Post-menopausal    Comment: 1st intercourse 73 yo-More than 5 partners  Lifestyle  . Physical activity:    Days per week: 5 days    Minutes per session: 60 min  . Stress: Not at all  Relationships  . Social connections:    Talks on phone: More than three times a week    Gets together: More than three times a week    Attends religious service: More than 4 times per year    Active member of club or organization: Yes    Attends meetings of clubs or organizations: More than 4 times per year    Relationship status: Divorced   Other Topics Concern  . 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      Regular exercise-no   Allergies  Allergen Reactions  . Arimidex [Anastrozole] Other (See Comments)    Unable to function, loopy, dizzy, and mentally fogginess  . Septra Ds [Sulfamethoxazole-Trimethoprim] Hives  . Sulfa Antibiotics   . Adhesive [Tape] Rash   Family History  Problem Relation Age of Onset  . Lung cancer Mother        smoker  . Endometriosis Mother   . Cancer Mother        Bladder cancer  . Stomach cancer Maternal Grandmother   . Coronary artery disease Father   . Cancer Father        intestinal carcinoid tumors  . Sudden death Sister        84 week old, congenital deformity  . Liver cancer Maternal Uncle   . Stroke Paternal Grandfather 74  . Leukemia Maternal Uncle   . Breast cancer Paternal Aunt        great paternal aunt-Age 44's  . Colon cancer Paternal Aunt        lived to 3; pat great aunt  . Vascular Disease Paternal Grandmother        thoracic aneurysm  . Esophageal cancer Neg Hx   . Rectal cancer Neg Hx   . Pancreatic cancer Neg Hx      Current Outpatient Medications (Cardiovascular):  .  rosuvastatin (CRESTOR) 40 MG tablet, TAKE 1 TABLET BY MOUTH ONCE DAILY (Patient taking differently: 40 mg. )   Current Outpatient Medications (Analgesics):  .  aspirin 81 MG tablet, Take 162 mg by mouth once.    Current Outpatient Medications (Other):  .  bimatoprost (LUMIGAN) 0.01 % SOLN, Place 1 drop into both eyes at bedtime. .  brinzolamide (AZOPT) 1 % ophthalmic suspension, 1 drop 2 (two) times daily. .  calcium carbonate (TUMS - DOSED IN MG ELEMENTAL CALCIUM) 500 MG chewable tablet, Chew 1 tablet by mouth as needed for indigestion or heartburn (1000 mg).  .  Carboxymethylcellulose Sodium (REFRESH LIQUIGEL OP), Apply 1 drop to eye 4 (four) times daily as needed. .  Cholecalciferol (VITAMIN D3 PO), Take 5,000 Int'l Units  by mouth daily.  .  famotidine (PEPCID) 20 MG tablet, TAKE 1 TABLET BY MOUTH TWICE DAILY BEFORE BREAKFAST AND  AT  DINNER .  letrozole (FEMARA) 2.5 MG tablet, Take 1 tablet (2.5 mg total) by mouth daily. .  sodium chloride (MURO 128) 5 % ophthalmic solution, Place 1 drop into both eyes  2 (two) times a day. Before Azopt    Past medical history, social, surgical and family history all reviewed in electronic medical record.  No pertanent information unless stated regarding to the chief complaint.   Review of Systems:  No headache, visual changes, nausea, vomiting, diarrhea, constipation, dizziness, abdominal pain, skin rash, fevers, chills, night sweats, weight loss, swollen lymph nodes, body aches, joint swelling,  chest pain, shortness of breath, mood changes.  Positive muscle aches  Objective  Blood pressure 116/60, pulse 77, height 5' 4.5" (1.638 m), weight 169 lb (76.7 kg), SpO2 97 %.    General: No apparent distress alert and oriented x3 mood and affect normal, dressed appropriately.  HEENT: Pupils equal, extraocular movements intact  Respiratory: Patient's speak in full sentences and does not appear short of breath  Cardiovascular: No lower extremity edema, non tender, no erythema  Skin: Warm dry intact with no signs of infection or rash on extremities or on axial skeleton.  Abdomen: Soft nontender  Neuro: Cranial nerves II through XII are intact, neurovascularly intact in all extremities with 2+ DTRs and 2+ pulses.  Lymph: No lymphadenopathy of posterior or anterior cervical chain or axillae bilaterally.  Gait normal with good balance and coordination.  MSK:  tender with full range of motion and good stability and symmetric strength and tone of , elbows, wrist, hip, knee and ankles bilaterally.   Right shoulder exam shows the patient has had improved range of motion but still decreasing internal range of motion.  Patient still has 4+ out of 5 strength in the rotator cuff compared to  the contralateral side.  Patient noted does have positive crossover and mild positive O'Brien sign on the right compared to the left.  Limited musculoskeletal ultrasound was performed and interpreted by Lyndal Pulley  Limited ultrasound shows the patient's partial tear has improved.  Still some calcific changes in the area.  I do not see any significant retraction.  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  Mild increase in hypoechoic changes of this joint.  Posteriorly mild calcific changes of the posterior labrum.    Impression and Recommendations:     This case required medical decision making of moderate complexity. The above documentation has been reviewed and is accurate and complete Lyndal Pulley, DO       Note: This dictation was prepared with Dragon dictation along with smaller phrase technology. Any transcriptional errors that result from this process are unintentional.

## 2018-09-03 ENCOUNTER — Other Ambulatory Visit: Payer: Self-pay

## 2018-09-03 ENCOUNTER — Ambulatory Visit: Payer: Self-pay

## 2018-09-03 ENCOUNTER — Encounter: Payer: Self-pay | Admitting: Family Medicine

## 2018-09-03 ENCOUNTER — Ambulatory Visit (INDEPENDENT_AMBULATORY_CARE_PROVIDER_SITE_OTHER): Payer: Medicare Other | Admitting: Family Medicine

## 2018-09-03 VITALS — BP 116/60 | HR 77 | Ht 64.5 in | Wt 169.0 lb

## 2018-09-03 DIAGNOSIS — G8929 Other chronic pain: Secondary | ICD-10-CM | POA: Diagnosis not present

## 2018-09-03 DIAGNOSIS — M25511 Pain in right shoulder: Principal | ICD-10-CM

## 2018-09-03 DIAGNOSIS — M75111 Incomplete rotator cuff tear or rupture of right shoulder, not specified as traumatic: Secondary | ICD-10-CM

## 2018-09-03 NOTE — Patient Instructions (Signed)
Good to see you  Ice is your friend  Exercises 3 times a week.  Make sure you do get out an walk a little bit as well  If knees worsen call me  See me again virtually in 4 weeks

## 2018-09-03 NOTE — Assessment & Plan Note (Signed)
Patient still has some mild limited range of motion.  We have been treating for more of a partial rotator cuff as well as a reactive shoulder bursitis and frozen shoulder.  Discussed with patient about different treatment options including injection which patient declined.  Does not want any advanced imaging.  We will continue with conservative therapy then otherwise.  Follow-up again in 8 weeks  Spent  25 minutes with patient face-to-face and had greater than 50% of counseling including as described above in assessment and plan.

## 2018-09-08 ENCOUNTER — Encounter: Payer: Self-pay | Admitting: Hematology and Oncology

## 2018-09-08 NOTE — Progress Notes (Signed)
error 

## 2018-09-08 NOTE — Assessment & Plan Note (Signed)
Left lumpectomy 05/31/2015: Invasive ductal carcinoma grade 2, 1.5 cm, 0/3 lymph nodes negative, broadly involves inferior margin, ER 100%, PR 95%, HER-2 negative, Ki-67 5%, T1 cN0 stage IA, Oncotype DX score 18, 12% ROR, Reexcision of margins negative  Adjuvant radiation therapy with Dr. Kinard 08/10/15- 09/16/15  Current treatment: Anastrozole 1 mg daily started June1st 2017 stopped 10/17/2015 due to dizziness lightheadedness and fatigue, switched to letrozole starting 11/09/2015  Letrozole toxicities: 1.Hot flashes Patient was getting letrozole from [facility] and was able to tolerate it well.  She was not able to find that supplier anymore.  I discussed with her that she needs to try another manufacturer and see if she tolerates that.  Breast Cancer Surveillance: 1. Breast exam5/10/2017: Mild lymphedema 2. Mammogram1/30/2020 at [facility]: Benign PET CT scan 07/29/2018: Evaluation of lung nodule: No hypermetabolic activity in the 8 mm right apical nodule  Return to clinic in  1 year for follow-up  

## 2018-09-09 ENCOUNTER — Telehealth: Payer: Self-pay | Admitting: Hematology and Oncology

## 2018-09-09 NOTE — Telephone Encounter (Signed)
Called regarding an upcoming Webex appointment, patient would prefer this to be a Doximetry visit.

## 2018-09-10 NOTE — Progress Notes (Signed)
HEMATOLOGY-ONCOLOGY Lindsey Barrett VISIT PROGRESS NOTE  I connected with Lindsey Barrett on 09/11/2018 at 10:00 AM EDT by Doximity video conference and verified that I am speaking with the correct person using two identifiers.  I discussed the limitations, risks, security and privacy concerns of performing an evaluation and management service by Doximity and the availability of in person appointments.  I also discussed with the patient that there may be a patient responsible charge related to this service. The patient expressed understanding and agreed to proceed.  Patient's Location: Home Physician Location: Clinic  CHIEF COMPLIANT: Follow-up on letrozole therapy  INTERVAL HISTORY: Lindsey Barrett is a 73 y.o. female with above-mentioned history of  left breast cancer treated with lumpectomy, radiation, and is currently on oral antiestrogen therapy with letrozole. She presents today over Doximity for 51-monthfollow-up.     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[place]   05/18/2015 Initial Diagnosis    Left breast biopsy: Invasive ductal carcinoma low to intermediate grade, ER 100%, PR 95%, HER-2 negative ratio 1.02, Ki-67 5%, left breast asymmetry 1.3 cm, T1c N0 stage IA    05/31/2015 Surgery    Left lumpectomy (Lindsey Barrett: Invasive ductal carcinoma grade 2, 1.5 cm, 0/3 lymph nodes negative, broadly involves inferior margin, ER 100%, PR 95%, HER-2 negative duration 112, Ki-67 5%, T1 cN0 stage IA, Oncotype DX score 18, 12% ROR    05/31/2015 Oncotype testing    Oncotype DX score: 18/12% ROR    07/01/2015 Surgery    Reexcision of the left inferior margin: Fibrocystic changes with UDH    08/10/2015 - 09/16/2015 Radiation Therapy    Adj XRT (Lindsey Barrett). 50.4 Gy in 28 fractions to the left breast    10/06/2015 -  Anti-estrogen oral therapy    Anastrozole 1 mg by mouth daily 5 years, switched to letrozole 11/09/2015 due to dizziness lightheadedness and fatigue     REVIEW OF  SYSTEMS:   Constitutional: Denies fevers, chills or abnormal weight loss Eyes: Denies blurriness of vision Ears, nose, mouth, throat, and face: Denies mucositis or sore throat Respiratory: Denies cough, dyspnea or wheezes Cardiovascular: Denies palpitation, chest discomfort Gastrointestinal:  Denies nausea, heartburn or change in bowel habits Skin: Denies abnormal skin rashes Lymphatics: Denies new lymphadenopathy or easy bruising Neurological:Denies numbness, tingling or new weaknesses Behavioral/Psych: Mood is stable, no new changes  Extremities: No lower extremity edema Breast: denies any pain or lumps or nodules in either breasts All other systems were reviewed with the patient and are negative.  Observations/Objective:  There were no vitals filed for this visit. There is no height or weight on file to calculate BMI.  I have reviewed the data as listed CMP Latest Ref Rng & Units 07/16/2018 01/13/2018 01/04/2017  Glucose 70 - 99 mg/dL 117(H) 105(H) 99  BUN 6 - 23 mg/dL '12 12 14  ' Creatinine 0.40 - 1.20 mg/dL 0.64 0.69 0.74  Sodium 135 - 145 mEq/L 141 141 141  Potassium 3.5 - 5.1 mEq/L 4.0 4.1 4.3  Chloride 96 - 112 mEq/L 103 105 104  CO2 19 - 32 mEq/L '30 28 29  ' Calcium 8.4 - 10.5 mg/dL 9.7 9.4 9.6  Total Protein 6.0 - 8.3 g/dL - 7.0 6.8  Total Bilirubin 0.2 - 1.2 mg/dL - 1.6(H) 1.4(H)  Alkaline Phos 39 - 117 U/L - 58 53  AST 0 - 37 U/L - 21 24  ALT 0 - 35 U/L - 20 20    Lab Results  Component Value Date  WBC 6.2 01/13/2018   HGB 13.9 01/13/2018   HCT 41.8 01/13/2018   MCV 85.3 01/13/2018   PLT 246.0 01/13/2018   NEUTROABS 3.1 01/13/2018      Assessment Plan:  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[place]) Left lumpectomy 05/31/2015: Invasive ductal carcinoma grade 2, 1.5 cm, 0/3 lymph nodes negative, broadly involves inferior margin, ER 100%, PR 95%, HER-2 negative, Ki-67 5%, T1 cN0 stage IA, Oncotype DX score 18, 12% ROR, Reexcision of margins  negative  Adjuvant radiation therapy with Dr. Sondra Barrett 08/10/15- 09/16/15  Current treatment: Anastrozole 1 mg daily started June1st 2017 stopped 10/17/2015 due to dizziness lightheadedness and fatigue, switched to letrozole starting 11/09/2015  Letrozole toxicities: 1.Hot flashes Patient was getting letrozole from [employer] and was able to tolerate it well.  She was not able to find that supplier anymore.  I discussed with her that she needs to try another manufacturer and see if she tolerates that.  Breast Cancer Surveillance: 1. Breast exam5/10/2017: Mild lymphedema 2. [facility]: Benign PET CT scan 07/29/2018: Evaluation of lung nodule: No hypermetabolic activity in the 8 mm right apical nodule  Return to clinic in  1 year for follow-up   I discussed the assessment and treatment plan with the patient. The patient was provided an opportunity to ask questions and all were answered. The patient agreed with the plan and demonstrated an understanding of the instructions. The patient was advised to call back or seek an in-person evaluation if the symptoms worsen or if the condition fails to improve as anticipated.   I provided 12 minutes of face-to-face Doximity time during this encounter.    Lindsey Eisenmenger, MD 09/11/2018   I, Lindsey Barrett, am acting as scribe for Lindsey Lose, MD.  I have reviewed the above documentation for accuracy and completeness, and I agree with the above.

## 2018-09-11 ENCOUNTER — Inpatient Hospital Stay: Payer: Medicare Other | Attending: Hematology and Oncology | Admitting: Hematology and Oncology

## 2018-09-11 DIAGNOSIS — C50212 Malignant neoplasm of upper-inner quadrant of left female breast: Secondary | ICD-10-CM | POA: Diagnosis not present

## 2018-09-11 DIAGNOSIS — Z170 Estrogen receptor positive status [ER+]: Secondary | ICD-10-CM

## 2018-09-11 MED ORDER — LETROZOLE 2.5 MG PO TABS: 2.5000 mg | ORAL_TABLET | Freq: Every day | ORAL | 3 refills | Status: DC

## 2018-09-15 ENCOUNTER — Encounter: Payer: Self-pay | Admitting: Hematology and Oncology

## 2018-09-30 NOTE — Progress Notes (Signed)
[name] [address] [place], [place] [address] Phone: [phone] Subjective:   I [name] am serving as a Education administrator for Dr. Hulan Saas.  Virtual Visit via Video Note  I connected with [name] on 10/01/18 at 11:15 AM EDT by a video enabled telemedicine application and verified that I am speaking with the correct person using two identifiers.  Location: Patient: Patient was in home setting and had difficulty with the visual aspect of the virtual platform   provider: I was in office setting   I discussed the limitations of evaluation and management by telemedicine and the availability of in person appointments. The patient expressed understanding and agreed to proceed.     I discussed the assessment and treatment plan with the patient. The patient was provided an opportunity to ask questions and all were answered. The patient agreed with the plan and demonstrated an understanding of the instructions.   The patient was advised to call back or seek an in-person evaluation if the symptoms worsen or if the condition fails to improve as anticipated.  I provided 16 minutes of non-face-to-face time during this encounter.   Lyndal Pulley, DO     CC: shoulder pain follow up   [id number]   09/03/2018 Patient still has some mild limited range of motion.  We have been treating for more of a partial rotator cuff as well as a reactive shoulder bursitis and frozen shoulder.  Discussed with patient about different treatment options including injection which patient declined.  Does not want any advanced imaging.  We will continue with conservative therapy then otherwise.  Follow-up again in 8 weeks    10/01/2018 MCKENZIE BOVE is a 73 y.o. female coming in with complaint of shoulder pain.  Patient has been having pain overall in the right shoulder and there is definite surrounding bone.  Still has some mild limited range of motion.   Patient did have a partial rotator cuff tear.  Has not been doing formal physical therapy.  Still having some mild discomfort but overall doing relatively well with daily activities.      Past Medical History:  Diagnosis Date  . Anxiety   . Atrophic vaginitis   . Breast cancer ([place])   . 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[place]) 05/20/2015  . Breast cyst    x3  . DDD (degenerative disc disease)   . Diverticulosis   . Fasting hyperglycemia   . GERD (gastroesophageal reflux disease)   . Gilbert syndrome   . Glaucoma   . History of hiatal hernia   . HSV infection   . Hyperlipidemia   . Radiation    left breast 50.4 gray  . STD (sexually transmitted disease)    HSV   Past Surgical History:  Procedure Laterality Date  . APPENDECTOMY  1961  . BREAST CYST ASPIRATION     x2 right ,x1 left  . COLONOSCOPY  2007   neg  . DILATION AND CURETTAGE OF UTERUS  1975  . FLEXIBLE SIGMOIDOSCOPY     x2  . PELVIC LAPAROSCOPY    . RADIOACTIVE SEED GUIDED PARTIAL MASTECTOMY WITH AXILLARY SENTINEL LYMPH NODE BIOPSY Left 05/31/2015   Procedure: LEFT BREAST RADIOACTIVE SEED GUIDED LUMPECTOMY WITH LEFT AXILLARY SENTINEL LYMPH NODE BIOPSY;  Surgeon: [name], MD;  Location: [facility];  Service: General;  Laterality: Left;  . RE-EXCISION OF BREAST LUMPECTOMY Left 07/01/2015   Procedure: RE-EXCISION OF  LEFT BREAST INFERIOR MARGIN;  Surgeon: [name], MD;  Location: [facility];  Service: General;  Laterality: Left;  . UPPER GASTROINTESTINAL ENDOSCOPY    . UPPER GI ENDOSCOPY  10/2010   Dr [name]  . WISDOM TOOTH EXTRACTION  1978   Social History   Socioeconomic History  . Marital status: Divorced    Spouse name: Not on file  . Number of children: 0  . Years of education: Not on file  . Highest education level: Not on file  Occupational History  . Occupation: Environmental health practitioner for [employer]  . Financial resource  strain: Not hard at all  . Food insecurity:    Worry: Never true    Inability: Never true  . Transportation needs:    Medical: No    Non-medical: No  Tobacco Use  . Smoking status: Former Smoker    Types: Cigarettes    Last attempt to quit: 03/28/1999    Years since quitting: 19.5  . Smokeless tobacco: Never Used  . Tobacco comment: smoked 1967-2000, up to 3/4 ppd  Substance and Sexual Activity  . Alcohol use: Yes    Alcohol/week: 2.0 standard drinks    Types: 2 Cans of beer per week  . Drug use: No  . Sexual activity: Never    Birth control/protection: Post-menopausal    Comment: 1st intercourse 73 yo-More than 5 partners  Lifestyle  . Physical activity:    Days per week: 5 days    Minutes per session: 60 min  . Stress: Not at all  Relationships  . Social connections:    Talks on phone: More than three times a week    Gets together: More than three times a week    Attends religious service: More than 4 times per year    Active member of club or organization: Yes    Attends meetings of clubs or organizations: More than 4 times per year    Relationship status: Divorced  Other Topics Concern  . 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      Regular exercise-no   Allergies  Allergen Reactions  . Arimidex [Anastrozole] Other (See Comments)    Unable to function, loopy, dizzy, and mentally fogginess  . Septra Ds [Sulfamethoxazole-Trimethoprim] Hives  . Sulfa Antibiotics   . Adhesive [Tape] Rash   Family History  Problem Relation Age of Onset  . Lung cancer Mother        smoker  . Endometriosis Mother   . Cancer Mother        Bladder cancer  . Stomach cancer Maternal Grandmother   . Coronary artery disease Father   . Cancer Father        intestinal carcinoid tumors  . Sudden death Sister        69 week old, congenital deformity  . Liver cancer Maternal Uncle   . Stroke Paternal Grandfather 73  .  Leukemia Maternal Uncle   . Breast cancer Paternal Aunt        great paternal aunt-Age 10's  . Colon cancer Paternal Aunt        lived to 92; pat great aunt  . Vascular Disease Paternal Grandmother        thoracic aneurysm  . Esophageal cancer Neg Hx   . Rectal cancer Neg Hx   . Pancreatic cancer Neg Hx      Current Outpatient  Medications (Cardiovascular):  .  rosuvastatin (CRESTOR) 40 MG tablet, TAKE 1 TABLET BY MOUTH ONCE DAILY (Patient taking differently: 40 mg. )   Current Outpatient Medications (Analgesics):  .  aspirin 81 MG tablet, Take 162 mg by mouth once.    Current Outpatient Medications (Other):  .  bimatoprost (LUMIGAN) 0.01 % SOLN, Place 1 drop into both eyes at bedtime. .  brinzolamide (AZOPT) 1 % ophthalmic suspension, 1 drop 2 (two) times daily. .  calcium carbonate (TUMS - DOSED IN MG ELEMENTAL CALCIUM) 500 MG chewable tablet, Chew 1 tablet by mouth as needed for indigestion or heartburn (1000 mg).  .  Carboxymethylcellulose Sodium (REFRESH LIQUIGEL OP), Apply 1 drop to eye 4 (four) times daily as needed. .  Cholecalciferol (VITAMIN D3 PO), Take 5,000 Int'l Units by mouth daily.  .  famotidine (PEPCID) 20 MG tablet, TAKE 1 TABLET BY MOUTH TWICE DAILY BEFORE BREAKFAST AND  AT  DINNER .  letrozole (FEMARA) 2.5 MG tablet, Take 1 tablet (2.5 mg total) by mouth daily. .  sodium chloride (MURO 128) 5 % ophthalmic solution, Place 1 drop into both eyes 2 (two) times a day. Before Azopt    Past medical history, social, surgical and family history all reviewed in electronic medical record.  No pertanent information unless stated regarding to the chief complaint.   Review of Systems:  No headache, visual changes, nausea, vomiting, diarrhea, constipation, dizziness, abdominal pain, skin rash, fevers, chills, night sweats, weight loss, swollen lymph nodes, body aches, joint swelling, muscle aches, chest pain, shortness of breath, mood changes.      Impression and  Recommendations:     The above documentation has been reviewed and is accurate and complete Lyndal Pulley, DO       Note: This dictation was prepared with Dragon dictation along with smaller phrase technology. Any transcriptional errors that result from this process are unintentional.

## 2018-10-01 ENCOUNTER — Encounter: Payer: Self-pay | Admitting: Family Medicine

## 2018-10-01 ENCOUNTER — Ambulatory Visit (INDEPENDENT_AMBULATORY_CARE_PROVIDER_SITE_OTHER): Payer: Medicare Other | Admitting: Family Medicine

## 2018-10-01 DIAGNOSIS — M75111 Incomplete rotator cuff tear or rupture of right shoulder, not specified as traumatic: Secondary | ICD-10-CM | POA: Diagnosis not present

## 2018-10-01 NOTE — Assessment & Plan Note (Addendum)
Partial tear.  Patient has not noticed any more weakness.  Still has some limited range of motion.  We discussed the possibility of injections, PRP, and advanced imaging which patient does not want to do at this moment.  Encouraged her to do home exercises on a more regular basis.  Follow-up again in 12 weeks

## 2018-10-17 ENCOUNTER — Other Ambulatory Visit: Payer: Self-pay | Admitting: Internal Medicine

## 2018-10-21 ENCOUNTER — Other Ambulatory Visit: Payer: Self-pay | Admitting: Physician Assistant

## 2018-11-11 DIAGNOSIS — H1859 Other hereditary corneal dystrophies: Secondary | ICD-10-CM | POA: Diagnosis not present

## 2018-11-11 DIAGNOSIS — H1851 Endothelial corneal dystrophy: Secondary | ICD-10-CM | POA: Diagnosis not present

## 2018-11-11 DIAGNOSIS — H53453 Other localized visual field defect, bilateral: Secondary | ICD-10-CM | POA: Diagnosis not present

## 2018-11-11 DIAGNOSIS — H5712 Ocular pain, left eye: Secondary | ICD-10-CM | POA: Diagnosis not present

## 2018-11-11 DIAGNOSIS — H40053 Ocular hypertension, bilateral: Secondary | ICD-10-CM | POA: Diagnosis not present

## 2018-12-10 ENCOUNTER — Encounter: Payer: Self-pay | Admitting: Internal Medicine

## 2018-12-16 ENCOUNTER — Other Ambulatory Visit: Payer: Self-pay

## 2018-12-17 ENCOUNTER — Ambulatory Visit (INDEPENDENT_AMBULATORY_CARE_PROVIDER_SITE_OTHER): Payer: Medicare Other | Admitting: Gynecology

## 2018-12-17 ENCOUNTER — Encounter: Payer: Self-pay | Admitting: Gynecology

## 2018-12-17 VITALS — BP 126/74 | Ht 64.0 in | Wt 166.0 lb

## 2018-12-17 DIAGNOSIS — Z9289 Personal history of other medical treatment: Secondary | ICD-10-CM | POA: Diagnosis not present

## 2018-12-17 DIAGNOSIS — N952 Postmenopausal atrophic vaginitis: Secondary | ICD-10-CM

## 2018-12-17 DIAGNOSIS — Z01419 Encounter for gynecological examination (general) (routine) without abnormal findings: Secondary | ICD-10-CM

## 2018-12-17 DIAGNOSIS — Z853 Personal history of malignant neoplasm of breast: Secondary | ICD-10-CM

## 2018-12-17 NOTE — Patient Instructions (Signed)
Follow-up in 1 year for annual exam 

## 2018-12-17 NOTE — Progress Notes (Signed)
    Lindsey Barrett [birth year] [id number]        73 y.o.  G0P0000 for breast and pelvic exam.  Without gynecologic complaints  Past medical history,surgical history, problem list, medications, allergies, family history and social history were all reviewed and documented as reviewed in the EPIC chart.  ROS:  Performed with pertinent positives and negatives included in the history, assessment and plan.   Additional significant findings : None   Exam: [name] assistant Vitals:   12/17/18 1117  BP: 126/74  Weight: 166 lb (75.3 kg)  Height: 5\' 4"  (1.626 m)   Body mass index is 28.49 kg/m.  General appearance:  Normal affect, orientation and appearance. Skin: Grossly normal HEENT: Without gross lesions.  No cervical or supraclavicular adenopathy. Thyroid normal.  Lungs:  Clear without wheezing, rales or rhonchi Cardiac: RR, without RMG Abdominal:  Soft, nontender, without masses, guarding, rebound, organomegaly or hernia Breasts:  Examined lying and sitting without masses, retractions, discharge or axillary adenopathy. Pelvic:  Ext, BUS, Vagina: With atrophic changes  Cervix: With atrophic changes  Uterus: Anteverted, normal size, shape and contour, midline and mobile nontender   Adnexa: Without masses or tenderness    Anus and perineum: Normal   Rectovaginal: Normal sphincter tone without palpated masses or tenderness.    Assessment/Plan:  73 y.o. G0P0000 female for breast and pelvic exam  1. Postmenopausal.  No significant menopausal symptoms or any vaginal bleeding.  Uses Replens intermittently for vaginal dryness. 2. History of HSV.  Occasional outbreaks.  Discussed options for Valtrex.  Patient does not want to take any medication.  Will call if changes mind. 3. History of left breast cancer.  Exam NED.  Mammography 05/2018.  Continues on aromatase inhibitor.  Continue to follow-up with oncology. 4. Pap smear 2019.  No Pap smear done today.  No history of abnormal Pap  smears.  Options to stop screening per current screening guidelines reviewed.  Will readdress on an annual basis. 5. Colonoscopy 2017.  Repeat at their recommended interval. 6. DEXA 2019 normal.  Repeat in several years as patient continues on a REM index. 7. Health maintenance.  No routine lab work done as patient does this elsewhere.  Follow-up 1 year, sooner as needed.   [name] MD, 11:49 AM 12/17/2018

## 2018-12-26 ENCOUNTER — Encounter: Payer: Self-pay | Admitting: Family Medicine

## 2018-12-30 ENCOUNTER — Encounter: Payer: Self-pay | Admitting: Internal Medicine

## 2018-12-30 DIAGNOSIS — L814 Other melanin hyperpigmentation: Secondary | ICD-10-CM | POA: Diagnosis not present

## 2018-12-30 DIAGNOSIS — Z86018 Personal history of other benign neoplasm: Secondary | ICD-10-CM | POA: Diagnosis not present

## 2018-12-30 DIAGNOSIS — L589 Radiodermatitis, unspecified: Secondary | ICD-10-CM | POA: Diagnosis not present

## 2018-12-30 DIAGNOSIS — D225 Melanocytic nevi of trunk: Secondary | ICD-10-CM | POA: Diagnosis not present

## 2018-12-30 DIAGNOSIS — L821 Other seborrheic keratosis: Secondary | ICD-10-CM | POA: Diagnosis not present

## 2018-12-31 ENCOUNTER — Ambulatory Visit (INDEPENDENT_AMBULATORY_CARE_PROVIDER_SITE_OTHER): Payer: Medicare Other | Admitting: Family Medicine

## 2018-12-31 ENCOUNTER — Encounter: Payer: Self-pay | Admitting: Family Medicine

## 2018-12-31 DIAGNOSIS — M7501 Adhesive capsulitis of right shoulder: Secondary | ICD-10-CM

## 2018-12-31 DIAGNOSIS — M75111 Incomplete rotator cuff tear or rupture of right shoulder, not specified as traumatic: Secondary | ICD-10-CM

## 2018-12-31 NOTE — Progress Notes (Signed)
Virtual Visit via Video Note  I connected with Lindsey Barrett on 12/31/18 at 11:30 AM EDT by a video enabled telemedicine application and verified that I am speaking with the correct person using two identifiers.  Location: Patient: in home  Provider: in office    I discussed the limitations of evaluation and management by telemedicine and the availability of in person appointments. The patient expressed understanding and agreed to proceed.  History of Present Illness: right shoulder pain.  Patient was seen previously and had a rotator cuff tear that did not improve but then got a frozen shoulder.  Patient has increased range of motion and is able to do most daily activities with very minimal discomfort.  Nothing that stops her or wakes her up at night anymore.  Would state that she is 95% better.    Observations/Objective: Alert and oriented x3, discussed icing regimen.   Assessment and Plan: Frozen shoulder nearly completely resolved at this time   Follow Up Instructions: Follow-up as needed    I discussed the assessment and treatment plan with the patient. The patient was provided an opportunity to ask questions and all were answered. The patient agreed with the plan and demonstrated an understanding of the instructions.   The patient was advised to call back or seek an in-person evaluation if the symptoms worsen or if the condition fails to improve as anticipated.  I provided 15 minutes of non-face-to-face time during this encounter.   Lyndal Pulley, DO

## 2019-01-01 DIAGNOSIS — H15833 Staphyloma posticum, bilateral: Secondary | ICD-10-CM | POA: Diagnosis not present

## 2019-01-01 DIAGNOSIS — H4423 Degenerative myopia, bilateral: Secondary | ICD-10-CM | POA: Diagnosis not present

## 2019-01-01 DIAGNOSIS — H25813 Combined forms of age-related cataract, bilateral: Secondary | ICD-10-CM | POA: Diagnosis not present

## 2019-01-01 DIAGNOSIS — H401131 Primary open-angle glaucoma, bilateral, mild stage: Secondary | ICD-10-CM | POA: Diagnosis not present

## 2019-01-01 DIAGNOSIS — H04123 Dry eye syndrome of bilateral lacrimal glands: Secondary | ICD-10-CM | POA: Diagnosis not present

## 2019-01-01 DIAGNOSIS — Z853 Personal history of malignant neoplasm of breast: Secondary | ICD-10-CM | POA: Diagnosis not present

## 2019-01-15 NOTE — Patient Instructions (Addendum)
  Tests ordered today. Your results will be released to [facility] (or called to you) after review.  If any changes need to be made, you will be notified at that same time.   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    Medications reviewed and updated.  Changes include :   none   Please followup in 1 year

## 2019-01-15 NOTE — Progress Notes (Signed)
Subjective:    Patient ID: Lindsey Barrett, female    DOB: Jan 02, 1946, 73 y.o.   MRN: [id number]  HPI The patient is here for follow up.  She is not exercising regularly.    Hyperlipidemia: She is taking her medication daily. She is compliant with a low fat/cholesterol diet. She denies myalgias.   GERD:  She is taking her medication daily as prescribed.  She denies any GERD symptoms and feels her GERD is well controlled.   Breast cancer, left:  She is following with oncology and taking femara daily.    Prediabetes:  She is compliant with a low sugar/carbohydrate diet.  She is not exercising regularly.   Medications and allergies reviewed with patient and updated if appropriate.  Patient Active Problem List   Diagnosis Date Noted  . Pulmonary nodule 07/29/2018  . Dupuytren's contracture of both hands 07/11/2018  . Ganglion cyst of volar aspect of left wrist 06/04/2018  . 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 Epistaxis 02/05/2018  . Right rotator cuff tear 10/15/2017  . Axillary lymphadenopathy 02/26/2017  . Tinnitus of both ears 01/01/2017  . Reaction to internal stress 05/29/2016  . Prediabetes 08/10/2015  . 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[place]) 05/20/2015  . Open-angle glaucoma 06/17/2014  . Vitamin D deficiency 10/18/2011  . HSV infection   . Atrophic vaginitis   . VARICOSE VEINS, LOWER EXTREMITIES 09/28/2009  . GERD 07/14/2009  . Hyperlipidemia 12/26/2006  . GILBERT'S SYNDROME 08/09/2006  . DEGENERATIVE DISC DISEASE 08/09/2006    Current Outpatient Medications on File Prior to Visit  Medication Sig Dispense Refill  . aspirin 81 MG tablet Take 162 mg by mouth once.     . bimatoprost (LUMIGAN) 0.01 % SOLN Place 1 drop into both eyes at bedtime.    . brinzolamide (AZOPT) 1 % ophthalmic suspension 1 drop 2 (two) times daily.    . calcium carbonate (TUMS - DOSED IN MG ELEMENTAL CALCIUM) 500 MG chewable tablet Chew 1 tablet by mouth as needed  for indigestion or heartburn (1000 mg).     . Carboxymethylcellulose Sodium (REFRESH LIQUIGEL OP) Apply 1 drop to eye 4 (four) times daily as needed.    . Cholecalciferol (VITAMIN D3 PO) Take 5,000 Int'l Units by mouth daily.     . famotidine (PEPCID) 20 MG tablet TAKE 1 TABLET BY MOUTH TWICE DAILY BEFORE BREAKFAST AND AT DINNER 60 tablet 2  . letrozole (FEMARA) 2.5 MG tablet Take 1 tablet (2.5 mg total) by mouth daily. 90 tablet 3  . rosuvastatin (CRESTOR) 40 MG tablet Take 1 tablet by mouth once daily 90 tablet 0  . sodium chloride (MURO 128) 5 % ophthalmic solution Place 1 drop into both eyes 2 (two) times a day. Before Azopt    . [DISCONTINUED] metoprolol tartrate (LOPRESSOR) 25 MG tablet Take 1 tablet (25 mg total) by mouth as directed. 1 pill every 8 hours as needed for tachycardia or palpitations. 30 tablet 2   No current facility-administered medications on file prior to visit.     Past Medical History:  Diagnosis Date  . Anxiety   . Atrophic vaginitis   . Breast cancer ([place])   . 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[place]) 05/20/2015  . Breast cyst    x3  . DDD (degenerative disc disease)   . Diverticulosis   . Fasting hyperglycemia   . GERD (gastroesophageal reflux disease)   . Gilbert syndrome   . Glaucoma   .  History of hiatal hernia   . HSV infection   . Hyperlipidemia   . Radiation    left breast 50.4 gray  . STD (sexually transmitted disease)    HSV    Past Surgical History:  Procedure Laterality Date  . APPENDECTOMY  1961  . BREAST CYST ASPIRATION     x2 right ,x1 left  . COLONOSCOPY  2007   neg  . DILATION AND CURETTAGE OF UTERUS  1975  . FLEXIBLE SIGMOIDOSCOPY     x2  . PELVIC LAPAROSCOPY    . RADIOACTIVE SEED GUIDED PARTIAL MASTECTOMY WITH AXILLARY SENTINEL LYMPH NODE BIOPSY Left 05/31/2015   Procedure: LEFT BREAST RADIOACTIVE SEED GUIDED LUMPECTOMY WITH LEFT AXILLARY SENTINEL LYMPH NODE BIOPSY;  Surgeon: [name], MD;   Location: [facility];  Service: General;  Laterality: Left;  . RE-EXCISION OF BREAST LUMPECTOMY Left 07/01/2015   Procedure: RE-EXCISION OF LEFT BREAST INFERIOR MARGIN;  Surgeon: [name], MD;  Location: [facility];  Service: General;  Laterality: Left;  . UPPER GASTROINTESTINAL ENDOSCOPY    . UPPER GI ENDOSCOPY  10/2010   Dr [name]  . WISDOM TOOTH EXTRACTION  1978    Social History   Socioeconomic History  . Marital status: Divorced    Spouse name: Not on file  . Number of children: 0  . Years of education: Not on file  . Highest education level: Not on file  Occupational History  . 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 Financial resource strain: Not hard at all  . Food insecurity    Worry: Never true    Inability: Never true  . Transportation needs    Medical: No    Non-medical: No  Tobacco Use  . Smoking status: Former Smoker    Types: Cigarettes    Quit date: 03/28/1999    Years since quitting: 19.8  . Smokeless tobacco: Never Used  . Tobacco comment: smoked 1967-2000, up to 3/4 ppd  Substance and Sexual Activity  . Alcohol use: Yes    Alcohol/week: 2.0 standard drinks    Types: 2 Cans of beer per week  . Drug use: No  . Sexual activity: Not Currently    Birth control/protection: Post-menopausal    Comment: 1st intercourse 73 yo-More than 5 partners  Lifestyle  . Physical activity    Days per week: 5 days    Minutes per session: 60 min  . Stress: Not at all  Relationships  . Social connections    Talks on phone: More than three times a week    Gets together: More than three times a week    Attends religious service: More than 4 times per year    Active member of club or organization: Yes    Attends meetings of clubs or organizations: More than 4 times per year    Relationship status: Divorced  Other Topics Concern  . Not on file  Social History Narrative   Occupation:  Environmental health practitioner for  [employer] alone.      Regular exercise-no    Family History  Problem Relation Age of Onset  . Lung cancer Mother        smoker  . Endometriosis Mother   . Cancer Mother        Bladder cancer  . Stomach cancer Maternal Grandmother   . Coronary artery disease Father   . Cancer Father  intestinal carcinoid tumors  . Sudden death Sister        39 week old, congenital deformity  . Liver cancer Maternal Uncle   . Stroke Paternal Grandfather 54  . Leukemia Maternal Uncle   . Breast cancer Paternal Aunt        great paternal aunt-Age 62's  . Colon cancer Paternal Aunt        lived to 30; pat great aunt  . Vascular Disease Paternal Grandmother        thoracic aneurysm  . Esophageal cancer Neg Hx   . Rectal cancer Neg Hx   . Pancreatic cancer Neg Hx     Review of Systems  Constitutional: Negative for chills and fever.  HENT: Positive for tinnitus.   Respiratory: Negative for cough, shortness of breath and wheezing.   Cardiovascular: Positive for palpitations. Negative for chest pain and leg swelling.  Neurological: Negative for light-headedness and headaches.       Objective:   Vitals:   01/16/19 1529  BP: 126/68  Pulse: 74  Resp: 16  Temp: 98.5 F (36.9 C)  SpO2: 98%   BP Readings from Last 3 Encounters:  01/16/19 126/68  01/16/19 126/68  12/17/18 126/74   Wt Readings from Last 3 Encounters:  01/16/19 167 lb (75.8 kg)  01/16/19 167 lb (75.8 kg)  12/17/18 166 lb (75.3 kg)   Body mass index is 28.67 kg/m.   Physical Exam    Constitutional: Appears well-developed and well-nourished. No distress.  HENT:  Head: Normocephalic and atraumatic.  Neck: Neck supple. No tracheal deviation present. No thyromegaly present.  No cervical lymphadenopathy Cardiovascular: Normal rate, regular rhythm and normal heart sounds.   No murmur heard. No carotid bruit .  No edema Pulmonary/Chest: Effort normal and breath sounds normal. No respiratory distress. No  has no wheezes. No rales. Abdomen: Soft, nontender, nondistended Skin: Skin is warm and dry. Not diaphoretic.  Psychiatric: Normal mood and affect. Behavior is normal.      Assessment & Plan:    See Problem List for Assessment and Plan of chronic medical problems.

## 2019-01-15 NOTE — Progress Notes (Addendum)
Subjective:   Lindsey Barrett is a 73 y.o. female who presents for Medicare Annual (Subsequent) preventive examination.  Review of Systems:   Cardiac Risk Factors include: advanced age (>17men, >67 women);dyslipidemia;hypertension Sleep patterns: feels rested on waking, gets up 1 times nightly to void and sleeps 8 hours nightly.    Home Safety/Smoke Alarms: Feels safe in home. Smoke alarms in place.  Living environment; residence and Firearm Safety: 1-story house/ trailer. Lives alone, no needs for DME, good support system Seat Belt Safety/Bike Helmet: Wears seat belt.      Objective:     Vitals: BP 126/68   Pulse 74   Temp 98.5 F (36.9 C)   Resp 17   Ht 5\' 4"  (1.626 m)   Wt 167 lb (75.8 kg)   SpO2 98%   BMI 28.67 kg/m   Body mass index is 28.67 kg/m.  Advanced Directives 01/16/2019 07/14/2018 05/12/2018 01/10/2018 11/29/2017 01/01/2017 08/29/2016  Does Patient Have a Medical Advance Directive? Yes Yes No Yes Yes Yes No  Type of Paramedic of [place];Living will [place];Living will - [place];Living will [place];Living will [place];Living will -  Does patient want to make changes to medical advance directive? - No - Patient declined - - No - Patient declined - -  Copy of [place] in Chart? No - copy requested No - copy requested - Yes Yes No - copy requested -  Would patient like information on creating a medical advance directive? - - No - Patient declined - - - -    Tobacco Social History   Tobacco Use  Smoking Status Former Smoker  . Types: Cigarettes  . Quit date: 03/28/1999  . Years since quitting: 19.8  Smokeless Tobacco Never Used  Tobacco Comment   smoked 1967-2000, up to 3/4 ppd     Counseling given: Not Answered Comment: smoked 1967-2000, up to 3/4 ppd  Past Medical History:  Diagnosis Date  . Anxiety   . Atrophic vaginitis   .  Breast cancer ([place])   . 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[place]) 05/20/2015  . Breast cyst    x3  . DDD (degenerative disc disease)   . Diverticulosis   . Fasting hyperglycemia   . GERD (gastroesophageal reflux disease)   . Gilbert syndrome   . Glaucoma   . History of hiatal hernia   . HSV infection   . Hyperlipidemia   . Radiation    left breast 50.4 gray  . STD (sexually transmitted disease)    HSV   Past Surgical History:  Procedure Laterality Date  . APPENDECTOMY  1961  . BREAST CYST ASPIRATION     x2 right ,x1 left  . COLONOSCOPY  2007   neg  . DILATION AND CURETTAGE OF UTERUS  1975  . FLEXIBLE SIGMOIDOSCOPY     x2  . PELVIC LAPAROSCOPY    . RADIOACTIVE SEED GUIDED PARTIAL MASTECTOMY WITH AXILLARY SENTINEL LYMPH NODE BIOPSY Left 05/31/2015   Procedure: LEFT BREAST RADIOACTIVE SEED GUIDED LUMPECTOMY WITH LEFT AXILLARY SENTINEL LYMPH NODE BIOPSY;  Surgeon: [name], MD;  Location: [facility];  Service: General;  Laterality: Left;  . RE-EXCISION OF BREAST LUMPECTOMY Left 07/01/2015   Procedure: RE-EXCISION OF LEFT BREAST INFERIOR MARGIN;  Surgeon: [name], MD;  Location: [facility];  Service: General;  Laterality: Left;  . UPPER GASTROINTESTINAL ENDOSCOPY    .  UPPER GI ENDOSCOPY  10/2010   Dr [name]  . WISDOM TOOTH EXTRACTION  1978   Family History  Problem Relation Age of Onset  . Lung cancer Mother        smoker  . Endometriosis Mother   . Cancer Mother        Bladder cancer  . Stomach cancer Maternal Grandmother   . Coronary artery disease Father   . Cancer Father        intestinal carcinoid tumors  . Sudden death Sister        58 week old, congenital deformity  . Liver cancer Maternal Uncle   . Stroke Paternal Grandfather 50  . Leukemia Maternal Uncle   . Breast cancer Paternal Aunt        great paternal aunt-Age 63's  . Colon cancer Paternal Aunt        lived to 48; pat great aunt  .  Vascular Disease Paternal Grandmother        thoracic aneurysm  . Esophageal cancer Neg Hx   . Rectal cancer Neg Hx   . Pancreatic cancer Neg Hx    Social History   Socioeconomic History  . Marital status: Divorced    Spouse name: Not on file  . Number of children: 0  . Years of education: Not on file  . Highest education level: Not on file  Occupational History  . 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 Financial resource strain: Not hard at all  . Food insecurity    Worry: Never true    Inability: Never true  . Transportation needs    Medical: No    Non-medical: No  Tobacco Use  . Smoking status: Former Smoker    Types: Cigarettes    Quit date: 03/28/1999    Years since quitting: 19.8  . Smokeless tobacco: Never Used  . Tobacco comment: smoked 1967-2000, up to 3/4 ppd  Substance and Sexual Activity  . Alcohol use: Yes    Alcohol/week: 2.0 standard drinks    Types: 2 Cans of beer per week  . Drug use: No  . Sexual activity: Not Currently    Birth control/protection: Post-menopausal    Comment: 1st intercourse 73 yo-More than 5 partners  Lifestyle  . Physical activity    Days per week: 5 days    Minutes per session: 60 min  . Stress: Not at all  Relationships  . Social connections    Talks on phone: More than three times a week    Gets together: More than three times a week    Attends religious service: More than 4 times per year    Active member of club or organization: Yes    Attends meetings of clubs or organizations: More than 4 times per year    Relationship status: Divorced  Other Topics Concern  . 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      Regular exercise-no    Outpatient Encounter Medications as of 01/16/2019  Medication Sig  . aspirin 81 MG tablet Take 162 mg by mouth once.   . bimatoprost (LUMIGAN) 0.01 % SOLN Place 1 drop into both eyes at bedtime.  .  brinzolamide (AZOPT) 1 % ophthalmic suspension 1 drop 2 (two) times daily.  . calcium carbonate (TUMS - DOSED IN MG ELEMENTAL CALCIUM) 500 MG chewable tablet Chew 1 tablet by mouth as needed  for indigestion or heartburn (1000 mg).   . Carboxymethylcellulose Sodium (REFRESH LIQUIGEL OP) Apply 1 drop to eye 4 (four) times daily as needed.  . Cholecalciferol (VITAMIN D3 PO) Take 5,000 Int'l Units by mouth daily.   . famotidine (PEPCID) 20 MG tablet TAKE 1 TABLET BY MOUTH TWICE DAILY BEFORE BREAKFAST AND AT DINNER  . letrozole (FEMARA) 2.5 MG tablet Take 1 tablet (2.5 mg total) by mouth daily.  . rosuvastatin (CRESTOR) 40 MG tablet Take 1 tablet by mouth once daily  . sodium chloride (MURO 128) 5 % ophthalmic solution Place 1 drop into both eyes 2 (two) times a day. Before Azopt  . [DISCONTINUED] metoprolol tartrate (LOPRESSOR) 25 MG tablet Take 1 tablet (25 mg total) by mouth as directed. 1 pill every 8 hours as needed for tachycardia or palpitations.   No facility-administered encounter medications on file as of 01/16/2019.     Activities of Daily Living In your present state of health, do you have any difficulty performing the following activities: 01/16/2019  Hearing? N  Vision? N  Difficulty concentrating or making decisions? N  Walking or climbing stairs? N  Dressing or bathing? N  Doing errands, shopping? N  Preparing Food and eating ? N  Using the Toilet? N  In the past six months, have you accidently leaked urine? N  Do you have problems with loss of bowel control? N  Managing your Medications? N  Managing your Finances? N  Housekeeping or managing your Housekeeping? N  Some recent data might be hidden    Patient Care Team: [name], MD as PCP - General (Internal Medicine) Phineas Real, Belinda Block, MD as Consulting Physician (Gynecology) Armbruster, Carlota Raspberry, MD as Consulting Physician (Gastroenterology) Calvert Cantor, MD as Consulting Physician (Ophthalmology)    Assessment:    This is a routine wellness examination for Junella. Physical assessment deferred to PCP.  Exercise Activities and Dietary recommendations Current Exercise Habits: Home exercise routine, Type of exercise: calisthenics, Time (Minutes): 20, Frequency (Times/Week): 3, Weekly Exercise (Minutes/Week): 60, Exercise limited by: orthopedic condition(s)  Diet (meal preparation, eat out, water intake, caffeinated beverages, dairy products, fruits and vegetables): in general, a "healthy" diet  , well balanced   Reviewed heart healthy diet. Encouraged patient to increase daily water and healthy fluid intake.  Goals    . Patient Stated     Lose weight by monitor my diet closer, use portion control, increase the amount of exercise I do. Change my sleep pattern by going to bed earlier and getting up earlier.    . Patient Stated       Fall Risk Fall Risk  01/10/2018 01/01/2017 10/27/2015 07/13/2015 06/10/2014  Falls in the past year? No No No No No    Depression Screen PHQ 2/9 Scores 01/10/2018 01/01/2017 10/27/2015 07/13/2015  PHQ - 2 Score 0 0 0 0  PHQ- 9 Score 2 2 - -  Exception Documentation - - - -     Cognitive Function     6CIT Screen 01/16/2019  What month? 0 points  What time? 0 points  Count back from 20 0 points  Months in reverse 0 points  Repeat phrase 0 points    Immunization History  Administered Date(s) Administered  . Hepatitis A, Adult 02/17/2016, 07/11/2018  . Influenza Split 04/18/2011  . Influenza, High Dose Seasonal PF 05/26/2013, 01/27/2016, 02/26/2017, 02/05/2018  . Influenza, Seasonal, Injecte, Preservative Fre 04/17/2012  . Pneumococcal Conjugate-13 02/17/2016  . Pneumococcal Polysaccharide-23 07/02/2012  . Tdap 08/02/2010  Screening Tests Health Maintenance  Topic Date Due  . INFLUENZA VACCINE  12/06/2018  . MAMMOGRAM  06/05/2020  . TETANUS/TDAP  08/01/2020  . DEXA SCAN  12/26/2020  . COLONOSCOPY  02/12/2021  . Hepatitis C Screening  Completed  . PNA vac Low  Risk Adult  Completed      Plan:    Reviewed health maintenance screenings with patient today and relevant education, vaccines, and/or referrals were provided.   I have personally reviewed and noted the following in the patient's chart:   . Medical and social history . Use of alcohol, tobacco or illicit drugs  . Current medications and supplements . Functional ability and status . Nutritional status . Physical activity . Advanced directives . List of other physicians . Vitals . Screenings to include cognitive, depression, and falls . Referrals and appointments  In addition, I have reviewed and discussed with patient certain preventive protocols, quality metrics, and best practice recommendations. A written personalized care plan for preventive services as well as general preventive health recommendations were provided to patient.     Michiel Cowboy, RN  01/16/2019   Medical screening examination/treatment/procedure(s) were performed by non-physician practitioner and as supervising physician I was immediately available for consultation/collaboration. I agree with above. [name], MD

## 2019-01-16 ENCOUNTER — Other Ambulatory Visit: Payer: Self-pay

## 2019-01-16 ENCOUNTER — Ambulatory Visit (INDEPENDENT_AMBULATORY_CARE_PROVIDER_SITE_OTHER): Payer: Medicare Other | Admitting: Internal Medicine

## 2019-01-16 ENCOUNTER — Ambulatory Visit (INDEPENDENT_AMBULATORY_CARE_PROVIDER_SITE_OTHER): Payer: Medicare Other | Admitting: *Deleted

## 2019-01-16 ENCOUNTER — Encounter: Payer: Self-pay | Admitting: Internal Medicine

## 2019-01-16 VITALS — BP 126/68 | HR 74 | Temp 98.5°F | Resp 16 | Ht 64.0 in | Wt 167.0 lb

## 2019-01-16 VITALS — BP 126/68 | HR 74 | Temp 98.5°F | Resp 17 | Ht 64.0 in | Wt 167.0 lb

## 2019-01-16 DIAGNOSIS — E782 Mixed hyperlipidemia: Secondary | ICD-10-CM

## 2019-01-16 DIAGNOSIS — R7303 Prediabetes: Secondary | ICD-10-CM

## 2019-01-16 DIAGNOSIS — Z23 Encounter for immunization: Secondary | ICD-10-CM

## 2019-01-16 DIAGNOSIS — K219 Gastro-esophageal reflux disease without esophagitis: Secondary | ICD-10-CM

## 2019-01-16 DIAGNOSIS — Z0000 Encounter for general adult medical examination without abnormal findings: Secondary | ICD-10-CM

## 2019-01-16 NOTE — Patient Instructions (Addendum)
Confirmed with patient if condition begins to worsen call PCP or go to the ER.  Patient was given the Call-a-Nurse line [phone]:   If you cannot attend class in person, you can still exercise at home. Video taped versions of AHOY classes are shown on [employer] (GTN) at 8 am and 1 pm Mondays through Fridays. You can also purchase a copy of the AHOY DVD by calling [place] (GTN) [employer]. GTN is available on [employer] channel 13 with a digital cable box and on [employer] channel 31. GTN is also available on [employer], channel 99. To view GTN, go to channel 99, press OK, select [place], then select GTN to start the channel.  Continue doing brain stimulating activities (puzzles, reading, adult coloring books, staying active) to keep memory sharp.   Continue to eat heart healthy diet (full of fruits, vegetables, whole grains, lean protein, water--limit salt, fat, and sugar intake) and increase physical activity as tolerated.  Lindsey Barrett , Thank you for taking time to come for your Medicare Wellness Visit. I appreciate your ongoing commitment to your health goals. Please review the following plan we discussed and let me know if I can assist you in the future.   These are the goals we discussed: Goals    . Patient Stated     Lose weight by monitor my diet closer, use portion control, increase the amount of exercise I do. Change my sleep pattern by going to bed earlier and getting up earlier.    . Patient Stated       This is a list of the screening recommended for you and due dates:  Health Maintenance  Topic Date Due  . Flu Shot  12/06/2018  . Mammogram  06/05/2020  . Tetanus Vaccine  08/01/2020  . DEXA scan (bone density measurement)  12/26/2020  . Colon Cancer Screening  02/12/2021  .  Hepatitis C: One time screening is recommended by Center for Disease Control  (CDC) for  adults born from [birth year] through 1963.   Completed   . Pneumonia vaccines  Completed      Preventive Care 50 Years and Older, Female Preventive care refers to lifestyle choices and visits with your health care provider that can promote health and wellness. This includes:  A yearly physical exam. This is also called an annual well check.  Regular dental and eye exams.  Immunizations.  Screening for certain conditions.  Healthy lifestyle choices, such as diet and exercise. What can I expect for my preventive care visit? Physical exam Your health care provider will check:  Height and weight. These may be used to calculate body mass index (BMI), which is a measurement that tells if you are at a healthy weight.  Heart rate and blood pressure.  Your skin for abnormal spots. Counseling Your health care provider may ask you questions about:  Alcohol, tobacco, and drug use.  Emotional well-being.  Home and relationship well-being.  Sexual activity.  Eating habits.  History of falls.  Memory and ability to understand (cognition).  Work and work Statistician.  Pregnancy and menstrual history. What immunizations do I need?  Influenza (flu) vaccine  This is recommended every year. Tetanus, diphtheria, and pertussis (Tdap) vaccine  You may need a Td booster every 10 years. Varicella (chickenpox) vaccine  You may need this vaccine if you have not already been vaccinated. Zoster (shingles) vaccine  You may need this after age 24. Pneumococcal conjugate (PCV13) vaccine  One  dose is recommended after age 65. Pneumococcal polysaccharide (PPSV23) vaccine  One dose is recommended after age 77. Measles, mumps, and rubella (MMR) vaccine  You may need at least one dose of MMR if you were born in 1957 or later. You may also need a second dose. Meningococcal conjugate (MenACWY) vaccine  You may need this if you have certain conditions. Hepatitis A vaccine  You may need this if you have certain conditions or if you  travel or work in places where you may be exposed to hepatitis A. Hepatitis B vaccine  You may need this if you have certain conditions or if you travel or work in places where you may be exposed to hepatitis B. Haemophilus influenzae type b (Hib) vaccine  You may need this if you have certain conditions. You may receive vaccines as individual doses or as more than one vaccine together in one shot (combination vaccines). Talk with your health care provider about the risks and benefits of combination vaccines. What tests do I need? Blood tests  Lipid and cholesterol levels. These may be checked every 5 years, or more frequently depending on your overall health.  Hepatitis C test.  Hepatitis B test. Screening  Lung cancer screening. You may have this screening every year starting at age 58 if you have a 30-pack-year history of smoking and currently smoke or have quit within the past 15 years.  Colorectal cancer screening. All adults should have this screening starting at age 43 and continuing until age 88. Your health care provider may recommend screening at age 10 if you are at increased risk. You will have tests every 1-10 years, depending on your results and the type of screening test.  Diabetes screening. This is done by checking your blood sugar (glucose) after you have not eaten for a while (fasting). You may have this done every 1-3 years.  Mammogram. This may be done every 1-2 years. Talk with your health care provider about how often you should have regular mammograms.  BRCA-related cancer screening. This may be done if you have a family history of breast, ovarian, tubal, or peritoneal cancers. Other tests  Sexually transmitted disease (STD) testing.  Bone density scan. This is done to screen for osteoporosis. You may have this done starting at age 43. Follow these instructions at home: Eating and drinking  Eat a diet that includes fresh fruits and vegetables, whole grains,  lean protein, and low-fat dairy products. Limit your intake of foods with high amounts of sugar, saturated fats, and salt.  Take vitamin and mineral supplements as recommended by your health care provider.  Do not drink alcohol if your health care provider tells you not to drink.  If you drink alcohol: ? Limit how much you have to 0-1 drink a day. ? Be aware of how much alcohol is in your drink. In the [place] one drink equals one 12 oz bottle of beer (355 mL), one 5 oz glass of Devesh Monforte (148 mL), or one 1 oz glass of hard liquor (44 mL). Lifestyle  Take daily care of your teeth and gums.  Stay active. Exercise for at least 30 minutes on 5 or more days each week.  Do not use any products that contain nicotine or tobacco, such as cigarettes, e-cigarettes, and chewing tobacco. If you need help quitting, ask your health care provider.  If you are sexually active, practice safe sex. Use a condom or other form of protection in order to prevent STIs (sexually transmitted  infections).  Talk with your health care provider about taking a low-dose aspirin or statin. What's next?  Go to your health care provider once a year for a well check visit.  Ask your health care provider how often you should have your eyes and teeth checked.  Stay up to date on all vaccines. This information is not intended to replace advice given to you by your health care provider. Make sure you discuss any questions you have with your health care provider. Document Released: 05/20/2015 Document Revised: 04/17/2018 Document Reviewed: 04/17/2018 Elsevier Patient Education  2020 [employer].

## 2019-01-16 NOTE — Assessment & Plan Note (Signed)
Check lipid panel, tsh, cmp  Continue daily statin Regular exercise and healthy diet encouraged  

## 2019-01-16 NOTE — Assessment & Plan Note (Addendum)
GERD controlled Continue daily medication - taking pepcid 10 mg in am and 20 mg in pm Takes occ tums

## 2019-01-16 NOTE — Assessment & Plan Note (Signed)
Check a1c Low sugar / carb diet Stressed regular exercise   

## 2019-01-17 ENCOUNTER — Encounter: Payer: Self-pay | Admitting: Internal Medicine

## 2019-01-22 ENCOUNTER — Encounter: Payer: Self-pay | Admitting: Internal Medicine

## 2019-01-22 ENCOUNTER — Ambulatory Visit (INDEPENDENT_AMBULATORY_CARE_PROVIDER_SITE_OTHER): Payer: Medicare Other

## 2019-01-22 ENCOUNTER — Other Ambulatory Visit (INDEPENDENT_AMBULATORY_CARE_PROVIDER_SITE_OTHER): Payer: Medicare Other

## 2019-01-22 ENCOUNTER — Other Ambulatory Visit: Payer: Self-pay

## 2019-01-22 DIAGNOSIS — E782 Mixed hyperlipidemia: Secondary | ICD-10-CM

## 2019-01-22 DIAGNOSIS — K219 Gastro-esophageal reflux disease without esophagitis: Secondary | ICD-10-CM | POA: Diagnosis not present

## 2019-01-22 DIAGNOSIS — Z23 Encounter for immunization: Secondary | ICD-10-CM | POA: Diagnosis not present

## 2019-01-22 DIAGNOSIS — R7303 Prediabetes: Secondary | ICD-10-CM | POA: Diagnosis not present

## 2019-01-22 LAB — COMPREHENSIVE METABOLIC PANEL
ALT: 18 U/L (ref 0–35)
AST: 23 U/L (ref 0–37)
Albumin: 4.3 g/dL (ref 3.5–5.2)
Alkaline Phosphatase: 58 U/L (ref 39–117)
BUN: 9 mg/dL (ref 6–23)
CO2: 32 mEq/L (ref 19–32)
Calcium: 9.5 mg/dL (ref 8.4–10.5)
Chloride: 104 mEq/L (ref 96–112)
Creatinine, Ser: 0.65 mg/dL (ref 0.40–1.20)
GFR: 89.71 mL/min (ref >60.00)
Glucose, Bld: 97 mg/dL (ref 70–99)
Potassium: 4.5 mEq/L (ref 3.5–5.1)
Sodium: 142 mEq/L (ref 135–145)
Total Bilirubin: 1.6 mg/dL — ABNORMAL HIGH (ref 0.2–1.2)
Total Protein: 6.9 g/dL (ref 6.0–8.3)

## 2019-01-22 LAB — LIPID PANEL
Cholesterol: 189 mg/dL (ref 0–200)
HDL: 50.7 mg/dL (ref >39.00)
LDL Cholesterol: 114 mg/dL — ABNORMAL HIGH (ref 0–99)
NonHDL: 138.2
Total CHOL/HDL Ratio: 4
Triglycerides: 123 mg/dL (ref 0.0–149.0)
VLDL: 24.6 mg/dL (ref 0.0–40.0)

## 2019-01-22 LAB — TSH: TSH: 2.73 u[IU]/mL (ref 0.35–4.50)

## 2019-01-22 LAB — CBC WITH DIFFERENTIAL/PLATELET
Basophils Absolute: 0 10*3/uL (ref 0.0–0.1)
Basophils Relative: 0.6 % (ref 0.0–3.0)
Eosinophils Absolute: 0.2 10*3/uL (ref 0.0–0.7)
Eosinophils Relative: 3.8 % (ref 0.0–5.0)
HCT: 39.7 % (ref 36.0–46.0)
Hemoglobin: 13.2 g/dL (ref 12.0–15.0)
Lymphocytes Relative: 32.4 % (ref 12.0–46.0)
Lymphs Abs: 2 10*3/uL (ref 0.7–4.0)
MCHC: 33.3 g/dL (ref 30.0–36.0)
MCV: 87.3 fl (ref 78.0–100.0)
Monocytes Absolute: 0.7 10*3/uL (ref 0.1–1.0)
Monocytes Relative: 10.8 % (ref 3.0–12.0)
Neutro Abs: 3.3 10*3/uL (ref 1.4–7.7)
Neutrophils Relative %: 52.4 % (ref 43.0–77.0)
Platelets: 216 10*3/uL (ref 150.0–400.0)
RBC: 4.54 Mil/uL (ref 3.87–5.11)
RDW: 13.9 % (ref 11.5–15.5)
WBC: 6.3 10*3/uL (ref 4.0–10.5)

## 2019-01-22 LAB — HEMOGLOBIN A1C: Hgb A1c MFr Bld: 6.2 % (ref 4.6–6.5)

## 2019-01-27 ENCOUNTER — Other Ambulatory Visit: Payer: Self-pay | Admitting: Internal Medicine

## 2019-02-11 ENCOUNTER — Encounter: Payer: Self-pay | Admitting: Gynecology

## 2019-02-24 ENCOUNTER — Encounter: Payer: Self-pay | Admitting: Family Medicine

## 2019-03-02 DIAGNOSIS — H9313 Tinnitus, bilateral: Secondary | ICD-10-CM | POA: Diagnosis not present

## 2019-03-02 DIAGNOSIS — H9113 Presbycusis, bilateral: Secondary | ICD-10-CM | POA: Diagnosis not present

## 2019-03-02 DIAGNOSIS — J342 Deviated nasal septum: Secondary | ICD-10-CM | POA: Insufficient documentation

## 2019-03-20 DIAGNOSIS — H903 Sensorineural hearing loss, bilateral: Secondary | ICD-10-CM | POA: Insufficient documentation

## 2019-03-20 DIAGNOSIS — H9313 Tinnitus, bilateral: Secondary | ICD-10-CM | POA: Diagnosis not present

## 2019-03-25 ENCOUNTER — Encounter: Payer: Self-pay | Admitting: Hematology and Oncology

## 2019-03-25 ENCOUNTER — Telehealth: Payer: Self-pay | Admitting: *Deleted

## 2019-03-25 NOTE — Telephone Encounter (Signed)
Received call from pt stating over the last week she has been experiencing pain and slight swelling in her left underarm.  Pt denies any recent trauma, lumps,redness, or signs of infection.  Pt stats a few days prior to having pain, she was cleaning out her garage, washed her car and also waxed her car.  Pt also states last night she lifted several heavy plants to bring inside.  RN educated pt that she may have pulled a muscle waxing the car, cleaning the garage, and lifting heavy plants.  Per Dr. Lindi Adie, pt to rest, apply cool compress and take OTC pain medication as directed on the bottle for a week to see if symptoms resolve.  Pt educated and verbalized understanding.

## 2019-03-26 DIAGNOSIS — H903 Sensorineural hearing loss, bilateral: Secondary | ICD-10-CM | POA: Diagnosis not present

## 2019-03-26 DIAGNOSIS — Z87891 Personal history of nicotine dependence: Secondary | ICD-10-CM | POA: Diagnosis not present

## 2019-03-26 DIAGNOSIS — H9313 Tinnitus, bilateral: Secondary | ICD-10-CM | POA: Diagnosis not present

## 2019-05-22 ENCOUNTER — Encounter: Payer: Self-pay | Admitting: Internal Medicine

## 2019-05-26 ENCOUNTER — Encounter: Payer: Self-pay | Admitting: Internal Medicine

## 2019-06-01 ENCOUNTER — Encounter: Payer: Self-pay | Admitting: Internal Medicine

## 2019-06-04 ENCOUNTER — Ambulatory Visit: Payer: Medicare Other

## 2019-06-11 ENCOUNTER — Encounter: Payer: Self-pay | Admitting: Hematology and Oncology

## 2019-06-11 DIAGNOSIS — R922 Inconclusive mammogram: Secondary | ICD-10-CM | POA: Diagnosis not present

## 2019-06-11 LAB — HM MAMMOGRAPHY

## 2019-06-12 ENCOUNTER — Ambulatory Visit: Payer: Medicare Other | Attending: Internal Medicine

## 2019-06-12 DIAGNOSIS — Z23 Encounter for immunization: Secondary | ICD-10-CM | POA: Insufficient documentation

## 2019-06-12 NOTE — Progress Notes (Signed)
   Covid-19 Vaccination Clinic  Name:  Lindsey Barrett    MRN: [id number] DOB: 1945-05-22  06/12/2019  Lindsey Barrett was observed post Covid-19 immunization for 15 minutes without incidence. She was provided with Vaccine Information Sheet and instruction to access the V-Safe system.   Lindsey Barrett was instructed to call 911 with any severe reactions post vaccine: [name] Difficulty breathing  . Swelling of your face and throat  . A fast heartbeat  . A bad rash all over your body  . Dizziness and weakness    Immunizations Administered    Name Date Dose VIS Date Route   Pfizer COVID-19 Vaccine 06/12/2019  4:58 PM 0.3 mL 04/17/2019 Intramuscular   Manufacturer: [place]   Lot: [id number]   [place]: [id number]

## 2019-06-17 ENCOUNTER — Encounter: Payer: Self-pay | Admitting: Internal Medicine

## 2019-06-22 ENCOUNTER — Telehealth: Payer: Self-pay | Admitting: Hematology and Oncology

## 2019-06-22 NOTE — Telephone Encounter (Signed)
Scheduled appt per 2/11 sch message - pt aware of appt date and time

## 2019-06-30 DIAGNOSIS — H18593 Other hereditary corneal dystrophies, bilateral: Secondary | ICD-10-CM | POA: Diagnosis not present

## 2019-06-30 DIAGNOSIS — H40053 Ocular hypertension, bilateral: Secondary | ICD-10-CM | POA: Diagnosis not present

## 2019-06-30 DIAGNOSIS — H18511 Endothelial corneal dystrophy, right eye: Secondary | ICD-10-CM | POA: Diagnosis not present

## 2019-06-30 DIAGNOSIS — H52223 Regular astigmatism, bilateral: Secondary | ICD-10-CM | POA: Diagnosis not present

## 2019-06-30 DIAGNOSIS — H2513 Age-related nuclear cataract, bilateral: Secondary | ICD-10-CM | POA: Diagnosis not present

## 2019-06-30 DIAGNOSIS — H524 Presbyopia: Secondary | ICD-10-CM | POA: Diagnosis not present

## 2019-06-30 DIAGNOSIS — H53453 Other localized visual field defect, bilateral: Secondary | ICD-10-CM | POA: Diagnosis not present

## 2019-07-07 ENCOUNTER — Ambulatory Visit: Payer: Medicare Other | Attending: Internal Medicine

## 2019-07-07 DIAGNOSIS — Z23 Encounter for immunization: Secondary | ICD-10-CM | POA: Insufficient documentation

## 2019-07-07 NOTE — Progress Notes (Signed)
   Covid-19 Vaccination Clinic  Name:  Lindsey Barrett    MRN: [id number] DOB: 1945-07-06  07/07/2019  [name] was observed post Covid-19 immunization for 15 minutes without incident. She was provided with Vaccine Information Sheet and instruction to access the V-Safe system.   [name] was instructed to call 911 with any severe reactions post vaccine: [name] Difficulty breathing  . Swelling of face and throat  . A fast heartbeat  . A bad rash all over body  . Dizziness and weakness   Immunizations Administered    Name Date Dose VIS Date Route   Pfizer COVID-19 Vaccine 07/07/2019  4:01 PM 0.3 mL 04/17/2019 Intramuscular   Manufacturer: [place]   Lot: [id number]   [place]: [id number]

## 2019-07-22 ENCOUNTER — Encounter: Payer: Self-pay | Admitting: Internal Medicine

## 2019-08-12 ENCOUNTER — Other Ambulatory Visit: Payer: Self-pay | Admitting: Internal Medicine

## 2019-09-14 NOTE — Progress Notes (Signed)
Patient Care Team: [name], MD as PCP - General (Internal Medicine) Fontaine, Belinda Block, MD (Inactive) as Consulting Physician (Gynecology) Armbruster, Carlota Raspberry, MD as Consulting Physician (Gastroenterology) Calvert Cantor, MD as Consulting Physician (Ophthalmology)  DIAGNOSIS:    ICD-10-CM   1. Malignant neoplasm of upper-inner quadrant of left breast in female, estrogen receptor positive ([place])  C50.212    Z17.0     SUMMARY OF ONCOLOGIC HISTORY: Oncology History  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[place])  05/18/2015 Initial Diagnosis   Left breast biopsy: Invasive ductal carcinoma low to intermediate grade, ER 100%, PR 95%, HER-2 negative ratio 1.02, Ki-67 5%, left breast asymmetry 1.3 cm, T1c N0 stage IA   05/31/2015 Surgery   Left lumpectomy [name]): Invasive ductal carcinoma grade 2, 1.5 cm, 0/3 lymph nodes negative, broadly involves inferior margin, ER 100%, PR 95%, HER-2 negative duration 112, Ki-67 5%, T1 cN0 stage IA, Oncotype DX score 18, 12% ROR   05/31/2015 Oncotype testing   Oncotype DX score: 18/12% ROR   07/01/2015 Surgery   Reexcision of the left inferior margin: Fibrocystic changes with UDH   08/10/2015 - 09/16/2015 Radiation Therapy   Adj XRT ([place]). 50.4 Gy in 28 fractions to the left breast   10/06/2015 -  Anti-estrogen oral therapy   Anastrozole 1 mg by mouth daily 5 years, switched to letrozole 11/09/2015 due to dizziness lightheadedness and fatigue     CHIEF COMPLIANT: Follow-up of left breast cancer on letrozole   INTERVAL HISTORY: Lindsey Barrett is a 74 y.o. with above-mentioned history of left breast cancer treated with lumpectomy, radiation, and is currently on oral antiestrogen therapy with letrozole. Mammogram on 06/11/19 showed no evidence of malignancy bilaterally. She presents to the clinic today for follow-up.   ALLERGIES:  is allergic to arimidex [anastrozole]; septra ds [sulfamethoxazole-trimethoprim]; sulfa  antibiotics; and adhesive [tape].  MEDICATIONS:  Current Outpatient Medications  Medication Sig Dispense Refill  . aspirin 81 MG tablet Take 162 mg by mouth once.     . bimatoprost (LUMIGAN) 0.01 % SOLN Place 1 drop into both eyes at bedtime.    . brinzolamide (AZOPT) 1 % ophthalmic suspension 1 drop 2 (two) times daily.    . calcium carbonate (TUMS - DOSED IN MG ELEMENTAL CALCIUM) 500 MG chewable tablet Chew 1 tablet by mouth as needed for indigestion or heartburn (1000 mg).     . Carboxymethylcellulose Sodium (REFRESH LIQUIGEL OP) Apply 1 drop to eye 4 (four) times daily as needed.    . Cholecalciferol (VITAMIN D3 PO) Take 5,000 Int'l Units by mouth daily.     . famotidine (PEPCID) 20 MG tablet TAKE 1 TABLET BY MOUTH TWICE DAILY BEFORE BREAKFAST AND AT DINNER 60 tablet 2  . letrozole (FEMARA) 2.5 MG tablet Take 1 tablet (2.5 mg total) by mouth daily. 90 tablet 3  . rosuvastatin (CRESTOR) 40 MG tablet Take 1 tablet by mouth once daily 90 tablet 1  . sodium chloride (MURO 128) 5 % ophthalmic solution Place 1 drop into both eyes 2 (two) times a day. Before Azopt     No current facility-administered medications for this visit.    PHYSICAL EXAMINATION: ECOG PERFORMANCE STATUS: 1 - Symptomatic but completely ambulatory  Vitals:   09/15/19 1458  BP: 123/66  Pulse: 71  Resp: 18  SpO2: 98%   Filed Weights   09/15/19 1458  Weight: 166 lb (75.3 kg)    BREAST: No palpable masses or nodules in either right or  left breasts. No palpable axillary supraclavicular or infraclavicular adenopathy no breast tenderness or nipple discharge. (exam performed in the presence of a chaperone)  LABORATORY DATA:  I have reviewed the data as listed CMP Latest Ref Rng & Units 01/22/2019 07/16/2018 01/13/2018  Glucose 70 - 99 mg/dL 97 117(H) 105(H)  BUN 6 - 23 mg/dL _0 Creatinine 0.40 - 1.20 mg/dL 0.65 0.64 0.69  Sodium 135 - 145 mEq/L 142 141 141  Potassium 3.5 - 5.1 mEq/L 4.5 4.0 4.1  Chloride 96 -  112 mEq/L 104 103 105  CO2 19 - 32 mEq/L 32 30 28  Calcium 8.4 - 10.5 mg/dL 9.5 9.7 9.4  Total Protein 6.0 - 8.3 g/dL 6.9 - 7.0  Total Bilirubin 0.2 - 1.2 mg/dL 1.6(H) - 1.6(H)  Alkaline Phos 39 - 117 U/L 58 - 58  AST 0 - 37 U/L 23 - 21  ALT 0 - 35 U/L 18 - 20    Lab Results  Component Value Date   WBC 6.3 01/22/2019   HGB 13.2 01/22/2019   HCT 39.7 01/22/2019   MCV 87.3 01/22/2019   PLT 216.0 01/22/2019   NEUTROABS 3.3 01/22/2019    ASSESSMENT & PLAN:  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[place]) Left lumpectomy 05/31/2015: Invasive ductal carcinoma grade 2, 1.5 cm, 0/3 lymph nodes negative, broadly involves inferior margin, ER 100%, PR 95%, HER-2 negative, Ki-67 5%, T1 cN0 stage IA, Oncotype DX score 18, 12% ROR, Reexcision of margins negative  Adjuvant radiation therapy with Dr. Sondra Come 08/10/15- 09/16/15  Current treatment: Anastrozole 1 mg daily started June1st 2017 stopped 10/17/2015 due to dizziness lightheadedness and fatigue, switched to letrozole starting 11/09/2015  Letrozole toxicities: 1.Hot flashes Patient [facility] and was able to tolerate it well. She was not able to find that supplier anymore. I discussed with her that she needs to try another manufacturer and see if she tolerates that.  Breast Cancer Surveillance: 1. Breast exam5/03/2020: Mild lymphedema 2. Mammogram2/08/2019 at [facility]: Benign PET CT scan 07/29/2018: Evaluation of lung nodule: No hypermetabolic activity in the 8 mm right apical nodule  Return to clinic in 1 year for follow-up     No orders of the defined types were placed in this encounter.  The patient has a good understanding of the overall plan. she agrees with it. she will call with any problems that may develop before the next visit here.  Total time spent: 20 mins including face to face time and time spent for planning, charting and coordination of care  Nicholas Lose, MD 09/15/2019  I, Cloyde Reams Dorshimer, am acting as scribe for Dr. Nicholas Lose.  I have reviewed the above documentation for accuracy and completeness, and I agree with the above.

## 2019-09-15 ENCOUNTER — Inpatient Hospital Stay: Payer: Medicare Other | Attending: Hematology and Oncology | Admitting: Hematology and Oncology

## 2019-09-15 ENCOUNTER — Other Ambulatory Visit: Payer: Self-pay

## 2019-09-15 ENCOUNTER — Telehealth: Payer: Self-pay | Admitting: Hematology and Oncology

## 2019-09-15 DIAGNOSIS — R911 Solitary pulmonary nodule: Secondary | ICD-10-CM | POA: Insufficient documentation

## 2019-09-15 DIAGNOSIS — Z79811 Long term (current) use of aromatase inhibitors: Secondary | ICD-10-CM | POA: Diagnosis not present

## 2019-09-15 DIAGNOSIS — Z923 Personal history of irradiation: Secondary | ICD-10-CM | POA: Diagnosis not present

## 2019-09-15 DIAGNOSIS — Z170 Estrogen receptor positive status [ER+]: Secondary | ICD-10-CM | POA: Insufficient documentation

## 2019-09-15 DIAGNOSIS — N951 Menopausal and female climacteric states: Secondary | ICD-10-CM | POA: Insufficient documentation

## 2019-09-15 DIAGNOSIS — C50212 Malignant neoplasm of upper-inner quadrant of left female breast: Secondary | ICD-10-CM | POA: Insufficient documentation

## 2019-09-15 MED ORDER — ASPIRIN EC 81 MG PO TBEC: 81.0000 mg | DELAYED_RELEASE_TABLET | Freq: Every day | ORAL | Status: AC

## 2019-09-15 MED ORDER — FAMOTIDINE 10 MG PO TABS: ORAL_TABLET | ORAL | Status: DC

## 2019-09-15 NOTE — Telephone Encounter (Signed)
Scheduled appt per 5/11 los. Gave pt a print out of AVS.

## 2019-09-15 NOTE — Assessment & Plan Note (Signed)
Left lumpectomy 05/31/2015: Invasive ductal carcinoma grade 2, 1.5 cm, 0/3 lymph nodes negative, broadly involves inferior margin, ER 100%, PR 95%, HER-2 negative, Ki-67 5%, T1 cN0 stage IA, Oncotype DX score 18, 12% ROR, Reexcision of margins negative  Adjuvant radiation therapy with Dr. Sondra Come 08/10/15- 09/16/15  Current treatment: Anastrozole 1 mg daily started June1st 2017 stopped 10/17/2015 due to dizziness lightheadedness and fatigue, switched to letrozole starting 11/09/2015  Letrozole toxicities: 1.Hot flashes Patient was getting [facility] and was able to tolerate it well. She was not able to find that supplier anymore. I discussed with her that she needs to try another manufacturer and see if she tolerates that.  Breast Cancer Surveillance: 1. Breast exam5/03/2020: Mild lymphedema 2. M[facility]2/08/2019 [facility]: Benign PET CT scan 07/29/2018: Evaluation of lung nodule: No hypermetabolic activity in the 8 mm right apical nodule  Return to clinic in 1 year for follow-up

## 2019-10-12 ENCOUNTER — Ambulatory Visit (INDEPENDENT_AMBULATORY_CARE_PROVIDER_SITE_OTHER): Payer: Medicare Other | Admitting: Internal Medicine

## 2019-10-12 ENCOUNTER — Encounter: Payer: Self-pay | Admitting: Internal Medicine

## 2019-10-12 ENCOUNTER — Other Ambulatory Visit: Payer: Self-pay

## 2019-10-12 DIAGNOSIS — R911 Solitary pulmonary nodule: Secondary | ICD-10-CM

## 2019-10-12 DIAGNOSIS — R918 Other nonspecific abnormal finding of lung field: Secondary | ICD-10-CM

## 2019-10-12 NOTE — Progress Notes (Signed)
Lindsey Barrett, female    DOB: 03-04-1946,    MRN: [id number]   Brief patient profile:  21   yowf quit smoking 2000  with h/o  L breast Ca 2017 s/p lumpectomy for Stage 1A with ER pos rx RT and letrozole 11/09/15 for 5 years planned with incidental dx of R apical 8 mm nodule when had ct head and neck p accident at home 07/14/2018 with w/u with Chest CT/PET and referred to pulmonary clinic 08/25/2018 by Dr  Billey Gosling with indeterminate results.      History of Present Illness  08/25/2018  Pulmonary/ 1st office eval/Lindsey Barrett  Chief Complaint  Patient presents with   Consult    Consult FF:MBWG Nodule. PET scan 07/29/18. She states she was fixing a light fixture and it fell and hit her on the head, she was directed to go to ER for CT scan and the nodule was found incidentally. Denies having any symptoms.   Dyspnea:  Not limited by breathing from desired activities  /works out at y when it's open Cough: none Sleep: no resp symptoms SABA use: none rec F/u CT chest   10/12/2019  f/u ov/Lindsey Barrett re:  SPN Chief Complaint  Patient presents with   Follow-up    45yr f/u for nodules. States her breathing has been stable since last visit.   Dyspnea:  Out of shape now as no gym since covid Cough: none  Sleeping: no resp SABA use: none 02: none    No obvious day to day or daytime variability or assoc excess/ purulent sputum or mucus plugs or hemoptysis or cp or chest tightness, subjective wheeze or overt sinus or hb symptoms.   Sleeping  without nocturnal  or early am exacerbation  of respiratory  c/o's or need for noct saba. Also denies any obvious fluctuation of symptoms with weather or environmental changes or other aggravating or alleviating factors except as outlined above   No unusual exposure hx or h/o childhood pna/ asthma or knowledge of premature birth.  Current Allergies, Complete Past Medical History, Past Surgical History, Family History, and Social History were reviewed in [employer] record.  ROS  The following are not active complaints unless bolded Hoarseness, sore throat, dysphagia, dental problems, itching, sneezing,  nasal congestion or discharge of excess mucus or purulent secretions, ear ache,   fever, chills, sweats, unintended wt loss or wt gain, classically pleuritic or exertional cp,  orthopnea pnd or arm/hand swelling  or leg swelling, presyncope, palpitations, abdominal pain, anorexia, nausea, vomiting, diarrhea  or change in bowel habits or change in bladder habits, change in stools or change in urine, dysuria, hematuria,  rash, arthralgias, visual complaints, headache, numbness, weakness or ataxia or problems with walking or coordination,  change in mood or  memory.        Current Meds  Medication Sig   aspirin EC 81 MG tablet Take 1 tablet (81 mg total) by mouth daily.   bimatoprost (LUMIGAN) 0.01 % SOLN Place 1 drop into both eyes at bedtime.   brinzolamide (AZOPT) 1 % ophthalmic suspension 1 drop 2 (two) times daily.   calcium carbonate (TUMS - DOSED IN MG ELEMENTAL CALCIUM) 500 MG chewable tablet Chew 1 tablet by mouth as needed for indigestion or heartburn (1000 mg).    Carboxymethylcellulose Sodium (REFRESH LIQUIGEL OP) Apply 1 drop to eye 4 (four) times daily as needed.   Cholecalciferol (VITAMIN D3 PO) Take 5,000 Int'l Units by mouth daily.  famotidine (PEPCID) 10 MG tablet Once daily   letrozole (FEMARA) 2.5 MG tablet Take 1 tablet (2.5 mg total) by mouth daily.   rosuvastatin (CRESTOR) 40 MG tablet Take 1 tablet by mouth once daily   sodium chloride (MURO 128) 5 % ophthalmic solution Place 1 drop into both eyes 2 (two) times a day. Before Azopt              Past Medical History:  Diagnosis Date   Anxiety    Atrophic vaginitis    Breast cancer ([place])    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[place]) 05/20/2015   Breast cyst    x3   DDD (degenerative disc disease)    Diverticulosis     Fasting hyperglycemia    GERD (gastroesophageal reflux disease)    [name] syndrome    Glaucoma    History of hiatal hernia    HSV infection    Hyperlipidemia    Radiation    left breast 50.4 gray   STD (sexually transmitted disease)    HSV        Objective:      amb wf nad     10/12/2019          166   08/25/18 169 lb 12.8 oz (77 kg)  07/14/18 175 lb (79.4 kg)  07/11/18 175 lb (79.4 kg)      amb very intelligent/ somewhat anxious  wf nad   Vital signs reviewed  10/12/2019  - Note at rest 02 sats  98% on RA   HEENT : pt wearing mask not removed for exam due to covid -19 concerns.    NECK :  without JVD/Nodes/TM/ nl carotid upstrokes bilaterally   LUNGS: no acc muscle use,  Nl contour chest which is clear to A and P bilaterally without cough on insp or exp maneuvers   CV:  RRR  no s3 or murmur or increase in P2, and no edema   ABD:  soft and nontender with nl inspiratory excursion in the supine position. No bruits or organomegaly appreciated, bowel sounds nl  MS:  Nl gait/ ext warm without deformities, calf tenderness, cyanosis or clubbing No obvious joint restrictions   SKIN: warm and dry without lesions    NEURO:  alert, approp, nl sensorium with  no motor or cerebellar deficits apparent.                Assessment

## 2019-10-12 NOTE — Patient Instructions (Signed)
We will call to schedule the CT chest next available

## 2019-10-14 ENCOUNTER — Encounter: Payer: Self-pay | Admitting: Internal Medicine

## 2019-10-14 NOTE — Assessment & Plan Note (Signed)
RUL 8 mm very smooth features original dx 07/14/2018 PET scan 7/42/5956-LO hypermetabolic activity - Pulmonary eval 08/25/2018 rec f/u in one year then in 18-24 m after that by Fleischner criteria for low risk (> 15 y since smoker, breast ca met very unlikely  - CT chest f/u ordered  CT results reviewed with pt >>> Too small for PET or bx, not suspicious enough for excisional bx > really only option for now is follow the Fleischner society guidelines as rec by radiology.   Discussed in detail all the  indications, usual  risks and alternatives  relative to the benefits with patient who agrees to proceed with w/u as outlined.            Each maintenance medication was reviewed in detail including emphasizing most importantly the difference between maintenance and prns and under what circumstances the prns are to be triggered using an action plan format where appropriate.  Total time for H and P, chart review, counseling,  and generating customized AVS unique to this office visit / charting = 20 min

## 2019-10-27 ENCOUNTER — Ambulatory Visit
Admission: RE | Admit: 2019-10-27 | Discharge: 2019-10-27 | Disposition: A | Discharge status: Home / Self Care | Payer: Medicare Other | Source: Ambulatory Visit | Attending: Internal Medicine | Admitting: Internal Medicine

## 2019-10-27 DIAGNOSIS — R918 Other nonspecific abnormal finding of lung field: Secondary | ICD-10-CM

## 2019-10-27 DIAGNOSIS — R911 Solitary pulmonary nodule: Secondary | ICD-10-CM

## 2019-10-29 ENCOUNTER — Telehealth: Payer: Self-pay | Admitting: Internal Medicine

## 2019-10-29 ENCOUNTER — Ambulatory Visit (INDEPENDENT_AMBULATORY_CARE_PROVIDER_SITE_OTHER): Payer: Medicare Other | Admitting: Physician Assistant

## 2019-10-29 ENCOUNTER — Encounter: Payer: Self-pay | Admitting: Physician Assistant

## 2019-10-29 VITALS — BP 118/64 | HR 79 | Ht 64.0 in | Wt 165.0 lb

## 2019-10-29 DIAGNOSIS — R911 Solitary pulmonary nodule: Secondary | ICD-10-CM

## 2019-10-29 DIAGNOSIS — K219 Gastro-esophageal reflux disease without esophagitis: Secondary | ICD-10-CM

## 2019-10-29 DIAGNOSIS — R55 Syncope and collapse: Secondary | ICD-10-CM

## 2019-10-29 MED ORDER — FAMOTIDINE 10 MG PO TABS: 10.0000 mg | ORAL_TABLET | Freq: Two times a day (BID) | ORAL | Status: DC

## 2019-10-29 NOTE — Progress Notes (Signed)
Subjective:    Patient ID: Lindsey Barrett, female    DOB: 28-Mar-1946, 74 y.o.   MRN: [id number]  HPI [name] is a pleasant 74 year old white female, established with Dr. Havery Moros, who was last seen in the office in September 2019 by myself.  She has a history of GERD, Gilberts syndrome,, and breast cancer which was diagnosed in 2017. She comes in today with concerns about recurrent episodes of postprandial lightheadedness or presyncope. She had undergone EGD in 2012 per Dr. [name] which showed changes consistent with GERD, no Barrett's.  Colonoscopy in 2017 with finding of a 5 mm tubular adenoma and diverticulosis.  She is due for interval follow-up in 2022. She says she has not been having any significant issues with acid reflux and had only been taking Pepcid 10 mg p.o. every morning.  She says occasionally at night she has to take a Tums which then affects her ability to take her statin.  She wonders if increasing Pepcid to 10 mg twice daily may be helpful. Her primary concern is episodes of dizziness or lightheadedness that occur sometimes after her evening meal.  She says she is having an episode about 1 time per month.  These episodes tend to occur after larger heavier meals or eating later in the evening.  They usually occur while she is sitting down, and she says she gets no warning, and will have a sudden sensation as if she might pass out.  She usually tries to get up and move around and eventually symptoms subside.  With some of these episodes she will have an urge for bowel movement and may eventually have a normal bowel movement and may be a few hours later another normal bowel movement.  The episodes are not associated with any diaphoresis, shortness of breath, abdominal pain, discomfort or cramping and no nausea or vomiting.  Her last episode was a couple of weeks ago.  The following day she says she feels fine.  Since her last appetite she has been watching her diet very  carefully, trying not to eat too much and trying to eat earlier in the evening.  She is also tried to increase her activity somewhat after eating being up and around rather than going to sit in a chair. This last episode, she wonders if this may have been precipitated by some meat that may have been bad though again she did not have any abdominal pain cramping diarrhea nausea or vomiting. She has been having episodes over the past few years, maybe with some mild increase in frequency.  She has never actually had a syncopal episode but obviously they make her anxious.  She has done some reading and thinks that she has an overactive vagus nerve.  She is unaware of any specific foods that trigger her symptoms.  She is unaware of having an elevated or low heart rate around the time of the symptoms. Review of Systems Pertinent positive and negative review of systems were noted in the above HPI section.  All other review of systems was otherwise negative.  Outpatient Encounter Medications as of 10/29/2019  Medication Sig  . aspirin EC 81 MG tablet Take 1 tablet (81 mg total) by mouth daily.  . bimatoprost (LUMIGAN) 0.01 % SOLN Place 1 drop into both eyes at bedtime.  . brinzolamide (AZOPT) 1 % ophthalmic suspension 1 drop 2 (two) times daily.  . calcium carbonate (TUMS - DOSED IN MG ELEMENTAL CALCIUM) 500 MG chewable tablet Chew 1 tablet  by mouth as needed for indigestion or heartburn (1000 mg).   . Carboxymethylcellulose Sodium (REFRESH LIQUIGEL OP) Apply 1 drop to eye 4 (four) times daily as needed.  . Cholecalciferol (VITAMIN D3 PO) Take 5,000 Int'l Units by mouth daily.   . famotidine (PEPCID) 10 MG tablet Take 1 tablet (10 mg total) by mouth 2 (two) times daily.  [name] letrozole (FEMARA) 2.5 MG tablet Take 1 tablet (2.5 mg total) by mouth daily.  . rosuvastatin (CRESTOR) 40 MG tablet Take 1 tablet by mouth once daily  . sodium chloride (MURO 128) 5 % ophthalmic solution Place 1 drop into both eyes 2 (two)  times a day. Before Azopt  . [DISCONTINUED] famotidine (PEPCID) 10 MG tablet Once daily  . [DISCONTINUED] metoprolol tartrate (LOPRESSOR) 25 MG tablet Take 1 tablet (25 mg total) by mouth as directed. 1 pill every 8 hours as needed for tachycardia or palpitations.   No facility-administered encounter medications on file as of 10/29/2019.   Allergies  Allergen Reactions  . Arimidex [Anastrozole] Other (See Comments)    Unable to function, loopy, dizzy, and mentally fogginess  . Septra Ds [Sulfamethoxazole-Trimethoprim] Hives  . Sulfa Antibiotics   . Adhesive [Tape] Rash   Patient Active Problem List   Diagnosis Date Noted  . Pulmonary nodule 07/29/2018  . Dupuytren's contracture of both hands 07/11/2018  . Ganglion cyst of volar aspect of left wrist 06/04/2018  . 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 Epistaxis 02/05/2018  . Right rotator cuff tear 10/15/2017  . Tinnitus of both ears 01/01/2017  . Reaction to internal stress 05/29/2016  . Prediabetes 08/10/2015  . 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[address]) 05/20/2015  . Open-angle glaucoma 06/17/2014  . Vitamin D deficiency 10/18/2011  . HSV infection   . Atrophic vaginitis   . VARICOSE VEINS, LOWER EXTREMITIES 09/28/2009  . GERD 07/14/2009  . Hyperlipidemia 12/26/2006  . GILBERT'S SYNDROME 08/09/2006  . DEGENERATIVE DISC DISEASE 08/09/2006   Social History   Socioeconomic History  . Marital status: Divorced    Spouse name: Not on file  . Number of children: 0  . Years of education: Not on file  . Highest education level: Not on file  Occupational History  . 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 Smoking status: Former Smoker    Types: Cigarettes    Quit date: 03/28/1999    Years since quitting: 20.6  . Smokeless tobacco: Never Used  . Tobacco comment: smoked 1967-2000, up to 3/4 ppd  Vaping Use  . Vaping Use: Never used  Substance and Sexual Activity  . Alcohol use: Yes     Alcohol/week: 2.0 standard drinks    Types: 2 Cans of beer per week  . Drug use: No  . Sexual activity: Not Currently    Birth control/protection: Post-menopausal    Comment: 1st intercourse 74 yo-More than 5 partners  Other Topics Concern  . 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      Regular exercise-no   Social Determinants of Health   Financial Resource Strain:   . Difficulty of Paying Living Expenses:   Food Insecurity:   . Worried About Charity fundraiser in the Last Year:   . Arboriculturist in the Last Year:   Transportation Needs:   . Film/video editor (Medical):   [name] Lack of Transportation (Non-Medical):  Physical Activity:   . Days of Exercise per Week:   . Minutes of Exercise per Session:   Stress:   . Feeling of Stress :   Social Connections:   . Frequency of Communication with Friends and Family:   . Frequency of Social Gatherings with Friends and Family:   . Attends Religious Services:   . Active Member of Clubs or Organizations:   . Attends Archivist Meetings:   [name] Marital Status:   Intimate Partner Violence:   . Fear of Current or Ex-Partner:   . Emotionally Abused:   [name] Physically Abused:   . Sexually Abused:     Ms. Stinger family history includes Breast cancer in her paternal aunt; Cancer in her father and mother; Colon cancer in her paternal aunt; Coronary artery disease in her father; Endometriosis in her mother; Leukemia in her maternal uncle; Liver cancer in her maternal uncle; Lung cancer in her mother; Stomach cancer in her maternal grandmother; Stroke (age of onset: 4) in her paternal grandfather; Sudden death in her sister; Vascular Disease in her paternal grandmother.      Objective:    Vitals:   10/29/19 1052  BP: 118/64  Pulse: 79  SpO2: 96%    Physical Exam Well-developed well-nourished older WF in no acute distress. Pleasant Height, Weight,165 BMI  28.2  HEENT; nontraumatic normocephalic, EOMI, PER R LA, sclera anicteric. Oropharynx; not done today Neck; supple, no JVD Cardiovascular; regular rate and rhythm with S1-S2, no murmur rub or gallop Pulmonary; Clear bilaterally Abdomen; soft, nontender, nondistended, no palpable mass or hepatosplenomegaly, bowel sounds are active Rectal; not done today Skin; benign exam, no jaundice rash or appreciable lesions Extremities; no clubbing cyanosis or edema skin warm and dry Neuro/Psych; alert and oriented x4, grossly nonfocal mood and affect appropriate       Assessment & Plan:   #80 74 year old white female with recurring episodes of presyncope,, always postprandially, while at rest- generally occurring about once per month.  No associated abdominal pain, cramping, urgency, diarrhea nausea vomiting diaphoresis chest pain shortness of breath. Episodes are generally brief and resolve with some activity.  Patient has noted episodes generally occur with larger meals or when eating later in the evening.  No specific food triggers.  Etiology of these episodes is not clear, question postprandial hypotension, question postprandial vagus dysfunction, rule out arrhythmia  #2 GERD, stable with occasional p.m. exacerbation of symptoms. 3.  History of adenomatous colon polyps-up-to-date with colonoscopy last done 2017, indicated for follow-up 2022 4.  History of breast cancer 2017 5.  Jobert syndrome  Plan; advised patient to continue to focus on eating smaller lighter meals in the evenings and earlier in the evenings. Will refer to cardiology for consideration of event monitoring. Continue Pepcid 10 mg but increase to 10 mg p.o. twice daily AC breakfast and AC dinner. Patient had advised to continue to document the episodes, and to call for any increase in frequency or severity. Will discuss with Dr. Havery Moros.   Debbera Wolken Genia Harold PA-C 10/29/2019   Cc: [name], MD

## 2019-10-29 NOTE — Patient Instructions (Addendum)
If you are age 72 or older, your body mass index should be between 23-30. Your Body mass index is 28.32 kg/m. If this is out of the aforementioned range listed, please consider follow up with your Primary Care Provider.  If you are age 52 or younger, your body mass index should be between 19-25. Your Body mass index is 28.32 kg/m. If this is out of the aformentioned range listed, please consider follow up with your Primary Care Provider.   Please purchase the following medications over the counter and take as directed: Pepcid 10 [id number] twice a day  We have scheduled you for an appointment at [facility] for intermittent presyncope on Monday, 11-02-19 at 4:00pm with Dr. Shelva Majestic. They are located at [address]. [address], [place].  Please follow up with Dr. Havery Moros as needed.  You will be due for a recall colonoscopy in 02-2021. We will send you a reminder in the mail when it gets closer to that time.  Thank you for entrusting me with your care and for choosing [facility], Amy Genia Harold, P.A.- C.

## 2019-10-29 NOTE — Progress Notes (Signed)
Agree with assessment and plan as outlined.  

## 2019-10-29 NOTE — Telephone Encounter (Signed)
Patient contacted with results, CT ordered for one year out.

## 2019-10-29 NOTE — Progress Notes (Signed)
Patient identification verified. Results of recent CT scan reviewed. Patient verbalized understanding of results. One year follow up CT scan ordered.

## 2019-10-31 ENCOUNTER — Other Ambulatory Visit: Payer: Self-pay | Admitting: Hematology and Oncology

## 2019-11-02 ENCOUNTER — Ambulatory Visit (INDEPENDENT_AMBULATORY_CARE_PROVIDER_SITE_OTHER): Payer: Medicare Other | Admitting: Cardiovascular Disease

## 2019-11-02 ENCOUNTER — Encounter: Payer: Self-pay | Admitting: Cardiovascular Disease

## 2019-11-02 ENCOUNTER — Other Ambulatory Visit: Payer: Self-pay

## 2019-11-02 DIAGNOSIS — R55 Syncope and collapse: Secondary | ICD-10-CM

## 2019-11-02 DIAGNOSIS — R7303 Prediabetes: Secondary | ICD-10-CM

## 2019-11-02 DIAGNOSIS — I700 Atherosclerosis of aorta: Secondary | ICD-10-CM | POA: Diagnosis not present

## 2019-11-02 DIAGNOSIS — I2510 Atherosclerotic heart disease of native coronary artery without angina pectoris: Secondary | ICD-10-CM | POA: Diagnosis not present

## 2019-11-02 DIAGNOSIS — K219 Gastro-esophageal reflux disease without esophagitis: Secondary | ICD-10-CM

## 2019-11-02 DIAGNOSIS — R911 Solitary pulmonary nodule: Secondary | ICD-10-CM

## 2019-11-02 DIAGNOSIS — E785 Hyperlipidemia, unspecified: Secondary | ICD-10-CM

## 2019-11-02 DIAGNOSIS — Z79899 Other long term (current) drug therapy: Secondary | ICD-10-CM

## 2019-11-02 NOTE — Patient Instructions (Addendum)
Medication Instructions:  CONTINUE WITH CURRENT MEDICATIONS. NO CHANGES.  *If you need a refill on your cardiac medications before your next appointment, please call your pharmacy*   Lab Work: FASTING LABS: CMET CBC TSH LIPID MAG If you have labs (blood work) drawn today and your tests are completely normal, you will receive your results only by: [name] MyChart Message (if you have MyChart) OR . A paper copy in the mail If you have any lab test that is abnormal or we need to change your treatment, we will call you to review the results.   Testing/Procedures: Your physician has requested that you have an echocardiogram. Echocardiography is a painless test that uses sound waves to create images of your heart. It provides your doctor with information about the size and shape of your heart and how well your heart's chambers and valves are working. This procedure takes approximately one hour. There are no restrictions for this procedure.  [place] has recommended that you wear an 30 DAY event monitor. Event monitors are medical devices that record the heart's electrical activity. Doctors most often [place] these monitors to diagnose arrhythmias. Arrhythmias are problems with the speed or rhythm of the heartbeat. The monitor is a small, portable device. You can wear one while you do your normal daily activities. This is usually used to diagnose what is causing palpitations/syncope (passing out).     Follow-Up: At [facility], you and your health needs are our priority.  As part of our continuing mission to provide you with exceptional heart care, we have created designated Provider Care Teams.  These Care Teams include your primary Cardiologist (physician) and Advanced Practice Providers (APPs -  Physician Assistants and Nurse Practitioners) who all work together to provide you with the care you need, when you need it.  We recommend signing up for the patient portal  called "MyChart".  Sign up information is provided on this After Visit Summary.  MyChart is used to connect with patients for Virtual Visits (Telemedicine).  Patients are able to view lab/test results, encounter notes, upcoming appointments, etc.  Non-urgent messages can be sent to your provider as well.   To learn more about what you can do with MyChart, go to [email or web address].    Your next appointment:   2-3 month(s)  The format for your next appointment:   In Person  Provider:   Shelva Majestic, MD

## 2019-11-02 NOTE — Progress Notes (Signed)
Cardiology Office Note    Date:  11/02/2019   ID:  Lindsey Barrett, DOB 1945/10/14, MRN [id number]  PCP:  [name], MD  Cardiologist:  Shelva Majestic, MD   New cardiology evaluation referred through the courtesy of Nicoletta Ba, PA for evaluation of episodes of transient presyncope.  History of Present Illness:  Lindsey Barrett is a 74 y.o. female who had previously been a remote patient of Dr. Ignacia Palma, Dr. Elicia Lamp, and has recently been followed by Dr. Havery Moros and Dr. Christinia Gully.  She is referred through the courtesy of  Nicoletta Ba for evaluation of several recent episodes of presyncope.  Lindsey Barrett denies any awareness of cardiac disease.  Remotely she had been told by Dr. Linna Darner that she had a systolic click on cardiac auscultation.  She has remote tobacco history and had smoked for 30 years from ages 76 until 26 but quit in 2000.  She is followed by Dr. Melvyn Novas for a stable 8 mm nodule in the right lung apex with well-circumscribed margins which has not changed from prior evaluations.  She states that she has experience of episodes where after she has eaten a large meal and sitting down she all of a sudden developed a wave of presyncope.  There is no chest pain and it typically lasts seconds.  She has not frankly blacked out.  She was recently evaluated by Nicoletta Ba, PA-C last week and there was some uncertainty as to what these episodes represented raising possibilities for post prandial hypotension, postprandial vagus dysfunction and concerns for arrhythmia.  She is now referred for cardiology evaluation.  In addition to the above, past history is also notable for breast cancer in 2017, history of adenomatous colonic polyps with a last colonoscopy in 2017.  She also has undergone prior endoscopy evaluations with Dr. Maurene Capes.  Carotid studies done in 2019 had shown minimal homogeneous plaque without hemodynamically significant stenoses.  Her most recent chest CT  on October 27, 2019 showed a stable 8 mm nodule in the right lung apex.  There was evidence for coronary artery and aortic atherosclerosis.  She presents for evaluation.  Past Medical History:  Diagnosis Date  . Anxiety   . Atrophic vaginitis   . Breast cancer ([place])   . 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[place]) 05/20/2015  . Breast cyst    x3  . DDD (degenerative disc disease)   . Diverticulosis   . Fasting hyperglycemia   . GERD (gastroesophageal reflux disease)   . Gilbert syndrome   . Glaucoma   . History of hiatal hernia   . HSV infection   . Hyperlipidemia   . Radiation    left breast 50.4 gray  . STD (sexually transmitted disease)    HSV    Past Surgical History:  Procedure Laterality Date  . APPENDECTOMY  1961  . BREAST CYST ASPIRATION     x2 right ,x1 left  . COLONOSCOPY  2007   neg  . DILATION AND CURETTAGE OF UTERUS  1975  . FLEXIBLE SIGMOIDOSCOPY     x2  . PELVIC LAPAROSCOPY    . RADIOACTIVE SEED GUIDED PARTIAL MASTECTOMY WITH AXILLARY SENTINEL LYMPH NODE BIOPSY Left 05/31/2015   Procedure: LEFT BREAST RADIOACTIVE SEED GUIDED LUMPECTOMY WITH LEFT AXILLARY SENTINEL LYMPH NODE BIOPSY;  Surgeon: [name], MD;  Location: [facility];  Service: General;  Laterality: Left;  . RE-EXCISION OF BREAST LUMPECTOMY Left 07/01/2015   Procedure:  RE-EXCISION OF LEFT BREAST INFERIOR MARGIN;  Surgeon: [name], MD;  Location: [facility];  Service: General;  Laterality: Left;  . UPPER GASTROINTESTINAL ENDOSCOPY    . UPPER GI ENDOSCOPY  10/2010   Dr [name]  . WISDOM TOOTH EXTRACTION  1978    Current Medications: Outpatient Medications Prior to Visit  Medication Sig Dispense Refill  . aspirin EC 81 MG tablet Take 1 tablet (81 mg total) by mouth daily.    . bimatoprost (LUMIGAN) 0.01 % SOLN Place 1 drop into both eyes at bedtime.    . brinzolamide (AZOPT) 1 % ophthalmic suspension 1 drop 2 (two) times daily.    .  calcium carbonate (TUMS - DOSED IN MG ELEMENTAL CALCIUM) 500 MG chewable tablet Chew 1 tablet by mouth as needed for indigestion or heartburn (1000 mg).     . Carboxymethylcellulose Sodium (REFRESH LIQUIGEL OP) Apply 1 drop to eye 4 (four) times daily as needed.    . Cholecalciferol (VITAMIN D3 PO) Take 5,000 Int'l Units by mouth daily.     . famotidine (PEPCID) 10 MG tablet Take 1 tablet (10 mg total) by mouth 2 (two) times daily.    [name] letrozole (FEMARA) 2.5 MG tablet Take 1 tablet by mouth once daily 90 tablet 3  . rosuvastatin (CRESTOR) 40 MG tablet Take 1 tablet by mouth once daily 90 tablet 1  . sodium chloride (MURO 128) 5 % ophthalmic solution Place 1 drop into both eyes 2 (two) times a day. Before Azopt     No facility-administered medications prior to visit.     Allergies:   Arimidex [anastrozole], Septra ds [sulfamethoxazole-trimethoprim], Sulfa antibiotics, and Adhesive [tape]   Social History   Socioeconomic History  . Marital status: Divorced    Spouse name: Not on file  . Number of children: 0  . Years of education: Not on file  . Highest education level: Not on file  Occupational History  . 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 Smoking status: Former Smoker    Types: Cigarettes    Quit date: 03/28/1999    Years since quitting: 20.6  . Smokeless tobacco: Never Used  . Tobacco comment: smoked 1967-2000, up to 3/4 ppd  Vaping Use  . Vaping Use: Never used  Substance and Sexual Activity  . Alcohol use: Yes    Alcohol/week: 2.0 standard drinks    Types: 2 Cans of beer per week  . Drug use: No  . Sexual activity: Not Currently    Birth control/protection: Post-menopausal    Comment: 1st intercourse 74 yo-More than 5 partners  Other Topics Concern  . 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      Regular exercise-no   Social Determinants of Health   Financial Resource Strain:    . Difficulty of Paying Living Expenses:   Food Insecurity:   . Worried About Charity fundraiser in the Last Year:   . Arboriculturist in the Last Year:   Transportation Needs:   . Film/video editor (Medical):   [name] Lack of Transportation (Non-Medical):   Physical Activity:   . Days of Exercise per Week:   . Minutes of Exercise per Session:   Stress:   . Feeling of Stress :   Social Connections:   . Frequency of Communication with Friends and Family:   . Frequency of Social Gatherings  with Friends and Family:   . Attends Religious Services:   . Active Member of Clubs or Organizations:   . Attends Archivist Meetings:   [name] Marital Status:      Family History:  The patient's family history includes Breast cancer in her paternal aunt; Cancer in her father and mother; Colon cancer in her paternal aunt; Coronary artery disease in her father; Endometriosis in her mother; Leukemia in her maternal uncle; Liver cancer in her maternal uncle; Lung cancer in her mother; Stomach cancer in her maternal grandmother; Stroke (age of onset: 73) in her paternal grandfather; Sudden death in her sister; Vascular Disease in her paternal grandmother.   ROS General: Negative; No fevers, chills, or night sweats;  HEENT: Negative; No changes in vision or hearing, sinus congestion, difficulty swallowing Pulmonary: History of lung nodule followed by Dr. Lenard Forth Cardiovascular: Negative; No chest pain, presyncope, syncope, palpitations GI: Increased abdominal gas GU: Negative; No dysuria, hematuria, or difficulty voiding Musculoskeletal: Negative; no myalgias, joint pain, or weakness Hematologic/Oncology: History of breast cancer  Endocrine: Negative; no heat/cold intolerance; no diabetes Neuro: Negative; no changes in balance, headaches Skin: Negative; No rashes or skin lesions Psychiatric: Negative; No behavioral problems, depression Sleep: Negative; No snoring, daytime sleepiness,  hypersomnolence, bruxism, restless legs, hypnogognic hallucinations, no cataplexy Other comprehensive 14 point system review is negative.   PHYSICAL EXAM:   VS:  BP 126/68   Pulse 72   Ht _0  (1.626 m)   Wt 165 lb 3.2 oz (74.9 kg)   SpO2 95%   BMI 28.36 kg/m     Repeat blood pressure by me was 124/70 supine and 124/70 standing  Wt Readings from Last 3 Encounters:  11/02/19 165 lb 3.2 oz (74.9 kg)  10/29/19 165 lb (74.8 kg)  10/12/19 166 lb 12.8 oz (75.7 kg)    General: Alert, oriented, no distress.  Skin: normal turgor, no rashes, warm and dry HEENT: Normocephalic, atraumatic. Pupils equal round and reactive to light; sclera anicteric; extraocular muscles intact;  Nose without nasal septal hypertrophy Mouth/Parynx benign; Mallinpatti scale 3 Neck: No JVD, no carotid bruits; normal carotid upstroke Lungs: clear to ausculatation and percussion; no wheezing or rales Chest wall: without tenderness to palpitation Heart: PMI not displaced, RRR, s1 s2 normal, 1/6 systolic murmur, no diastolic murmur, intermittent systolic click; no rubs, gallops, thrills, or heaves Abdomen: soft, nontender; no hepatosplenomehaly, BS+; abdominal aorta nontender and not dilated by palpation. Back: no CVA tenderness Pulses 2+ Musculoskeletal: full range of motion, normal strength, no joint deformities Extremities: no clubbing cyanosis or edema, Homan's sign negative  Neurologic: grossly nonfocal; Cranial nerves grossly wnl Psychologic: Normal mood and affect   Studies/Labs Reviewed:   EKG:  EKG is ordered today.  ECG (independently read by me): Normal sinus rhythm at 72 bpm.  Low voltage.  Nonspecific T wave change.  Intervals normal    Recent Labs: BMP Latest Ref Rng & Units 01/22/2019 07/16/2018 01/13/2018  Glucose 70 - 99 mg/dL 97 117(H) 105(H)  BUN 6 - 23 mg/dL _1 Creatinine 0.40 - 1.20 mg/dL 0.65 0.64 0.69  Sodium 135 - 145 mEq/L 142 141 141  Potassium 3.5 - 5.1 mEq/L 4.5 4.0 4.1    Chloride 96 - 112 mEq/L 104 103 105  CO2 19 - 32 mEq/L 32 30 28  Calcium 8.4 - 10.5 mg/dL 9.5 9.7 9.4     Hepatic Function Latest Ref Rng & Units 01/22/2019 01/13/2018 01/04/2017  Total Protein 6.0 -  8.3 g/dL 6.9 7.0 6.8  Albumin 3.5 - 5.2 g/dL 4.3 4.3 4.4  AST 0 - 37 U/L _0 ALT 0 - 35 U/L _1 Alk Phosphatase 39 - 117 U/L 58 58 53  Total Bilirubin 0.2 - 1.2 mg/dL 1.6(H) 1.6(H) 1.4(H)  Bilirubin, Direct 0.0 - 0.3 mg/dL - - -    CBC Latest Ref Rng & Units 01/22/2019 01/13/2018 01/04/2017  WBC 4.0 - 10.5 K/uL 6.3 6.2 5.2  Hemoglobin 12.0 - 15.0 g/dL 13.2 13.9 13.5  Hematocrit 36 - 46 % 39.7 41.8 40.8  Platelets 150 - 400 K/uL 216.0 246.0 223.0   Lab Results  Component Value Date   MCV 87.3 01/22/2019   MCV 85.3 01/13/2018   MCV 87.8 01/04/2017   Lab Results  Component Value Date   TSH 2.73 01/22/2019   Lab Results  Component Value Date   HGBA1C 6.2 01/22/2019     BNP No results found for: BNP  ProBNP No results found for: PROBNP   Lipid Panel     Component Value Date/Time   CHOL 189 01/22/2019 1008   CHOL 216 (H) 06/18/2014 1053   TRIG 123.0 01/22/2019 1008   TRIG 110 06/18/2014 1053   HDL 50.70 01/22/2019 1008   HDL 52 06/18/2014 1053   CHOLHDL 4 01/22/2019 1008   VLDL 24.6 01/22/2019 1008   LDLCALC 114 (H) 01/22/2019 1008   LDLCALC 142 (H) 06/18/2014 1053   LDLDIRECT 148.9 09/23/2009 0835     RADIOLOGY: CT Chest Wo Contrast  Result Date: 10/27/2019 CLINICAL DATA:  Abnormal x-ray, lung nodule follow-up EXAM: CT CHEST WITHOUT CONTRAST TECHNIQUE: Multidetector CT imaging of the chest was performed following the standard protocol without IV contrast. COMPARISON:  07/17/2018 FINDINGS: Cardiovascular: Scattered calcified atheromatous plaque of the thoracic aorta. No aneurysmal dilation. Heart size is normal. Scattered coronary artery calcifications. No pericardial effusion. Caliber central pulmonary vasculature is normal. Vessels not well assessed  given lack of intravenous contrast. Mediastinum/Nodes: Thoracic inlet structures are normal. Postoperative changes in the LEFT breast and LEFT axilla similar to the prior study. No adenopathy in the chest. Lungs/Pleura: Stable 8 mm nodule at the RIGHT lung apex with well-circumscribed margins. No consolidation. No pleural effusion. Airways are patent. Upper Abdomen: Incidental imaging of upper abdominal contents are unremarkable. Musculoskeletal: Postoperative changes in the LEFT breast and LEFT axilla. No chest wall mass. No internal mammary adenopathy. No acute bone finding. No destructive bone process. Spinal degenerative changes. IMPRESSION: 1. Stable 8 mm nodule at the RIGHT lung apex with well-circumscribed margins. Stability argues for benign etiology or indolent process. To further document stability 6-12 month follow-up is suggested. 2. Postoperative changes in the LEFT breast and LEFT axilla similar to the prior study. 3. No acute cardiopulmonary process. 4. Coronary artery and aortic atherosclerosis. Aortic Atherosclerosis (ICD10-I70.0). Electronically Signed   By: Zetta Bills M.D.   On: 10/27/2019 15:50     Additional studies/ records that were reviewed today include:  I reviewed records from  gastroenterology, [facility], carotid duplex imaging, CT imaging of her chest.    ASSESSMENT:    1. Pre-syncope   2. Aortic atherosclerosis (HCC)   3. Atherosclerosis of native coronary artery of native heart without angina pectoris   4. Gastroesophageal reflux disease without esophagitis   5. Prediabetes   6. Hyperlipidemia with target LDL less than 70   7. Lung nodule, solitary   8. Medication management     PLAN:  Lindsey Barrett  is a 74 year old female who was recently developed episodes following eating when she is sitting down that she experiences a wave of presyncope and almost passes out which is short-lived and ultimately resolved.  She is unaware of any exertional chest  pain symptoms.  She has been told in the past of having issues with her vagus nerve and these episodes have been been concerning for possible postprandial transient hypotension versus postprandial vagus nerve dysfunction or possible arrhythmia.  I am recommending she undergo a 2D echo Doppler study to assess LV systolic and diastolic function.  I have suggested she wear a 30-day event monitor to evaluate any potential cardiac arrhythmia which may be contributing to her symptomatology.  She has a history of hyperlipidemia and in September 2020 LDL cholesterol was still elevated at 114 despite being on rosuvastatin 40 mg daily.  I have recommended a complete set of follow-up laboratory with a comprehensive metabolic panel, magnesium level, TSH, CBC and fasting lipid studies.  Her recent chest CT did show evidence for aortic as well as coronary artery atherosclerosis.  As result target LDL is less than 70.  If lipids remain elevated Zetia will be added to her regimen.  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  She is being followed by Dr. Shyrl Numbers with yearly surveillance of an isolated stable right lung apex nodule.  She has remote tobacco history and had smoked for 30 years but has been set for 21 years.  I will see her in follow-up of the above studies in 2 to 3 months and further recommendations will be made at that time.   Medication Adjustments/Labs and Tests Ordered: Current medicines are reviewed at length with the patient today.  Concerns regarding medicines are outlined above.  Medication changes, Labs and Tests ordered today are listed in the Patient Instructions below. Patient Instructions  Medication Instructions:  CONTINUE WITH CURRENT MEDICATIONS. NO CHANGES.  *If you need a refill on your cardiac medications before your next appointment, please call your pharmacy*   Lab Work: FASTING LABS: CMET CBC TSH LIPID MAG If you have  labs (blood work) drawn today and your tests are completely normal, you will receive your results only by: [name] MyChart Message (if you have MyChart) OR . A paper copy in the mail If you have any lab test that is abnormal or we need to change your treatment, we will call you to review the results.   Testing/Procedures: Your physician has requested that you have an echocardiogram. Echocardiography is a painless test that uses sound waves to create images of your heart. It provides your doctor with information about the size and shape of your heart and how well your heart's chambers and valves are working. This procedure takes approximately one hour. There are no restrictions for this procedure.  [place] has recommended that you wear an 30 DAY event monitor. Event monitors are medical devices that record the heart's electrical activity. Doctors most often [place] these monitors to diagnose arrhythmias. Arrhythmias are problems with the speed or rhythm of the heartbeat. The monitor is a small, portable device. You can wear one while you do your normal daily activities. This is usually used to diagnose what is causing palpitations/syncope (passing out).     Follow-Up: At [facility], you and your health needs are our priority.  As part of our continuing mission to provide you with exceptional heart care,  we have created designated Provider Care Teams.  These Care Teams include your primary Cardiologist (physician) and Advanced Practice Providers (APPs -  Physician Assistants and Nurse Practitioners) who all work together to provide you with the care you need, when you need it.  We recommend signing up for the patient portal called "MyChart".  Sign up information is provided on this After Visit Summary.  MyChart is used to connect with patients for Virtual Visits (Telemedicine).  Patients are able to view lab/test results, encounter notes, upcoming appointments, etc.  Non-urgent  messages can be sent to your provider as well.   To learn more about what you can do with MyChart, go to [email or web address].    Your next appointment:   2-3 month(s)  The format for your next appointment:   In Person  Provider:   Shelva Majestic, MD        Signed, Shelva Majestic, MD  11/02/2019 9:57 PM    [facility] [address], [place], [place], [place]  [address] Phone: [phone]

## 2019-11-03 ENCOUNTER — Telehealth: Payer: Self-pay | Admitting: Cardiovascular Disease

## 2019-11-03 NOTE — Telephone Encounter (Signed)
New Message:   Pt said Dr Claiborne Billings wanted to see her in 2 to 3 months from yesterday. Dr Claiborne Billings first available appt in person is in November. She said to ask him, does he want her to see one of his PA's, Viirtual Visit or wait until November?

## 2019-11-03 NOTE — Telephone Encounter (Signed)
Dr.Kelly would like to see pt in office in his clinic. Will send message to schedulers to contact pt.

## 2019-11-04 ENCOUNTER — Telehealth: Payer: Self-pay

## 2019-11-04 LAB — COMPREHENSIVE METABOLIC PANEL
ALT: 15 IU/L (ref 0–32)
AST: 21 IU/L (ref 0–40)
Albumin/Globulin Ratio: 1.8 (ref 1.2–2.2)
Albumin: 4.4 g/dL (ref 3.7–4.7)
Alkaline Phosphatase: 73 IU/L (ref 48–121)
BUN/Creatinine Ratio: 16 (ref 12–28)
BUN: 10 mg/dL (ref 8–27)
Bilirubin Total: 1.1 mg/dL (ref 0.0–1.2)
CO2: 23 mmol/L (ref 20–29)
Calcium: 9.4 mg/dL (ref 8.7–10.3)
Chloride: 104 mmol/L (ref 96–106)
Creatinine, Ser: 0.63 mg/dL (ref 0.57–1.00)
GFR calc Af Amer: 104 mL/min/{1.73_m2} (ref >59)
GFR calc non Af Amer: 90 mL/min/{1.73_m2} (ref >59)
Globulin, Total: 2.4 g/dL (ref 1.5–4.5)
Glucose: 94 mg/dL (ref 65–99)
Potassium: 4.5 mmol/L (ref 3.5–5.2)
Sodium: 142 mmol/L (ref 134–144)
Total Protein: 6.8 g/dL (ref 6.0–8.5)

## 2019-11-04 LAB — CBC
Hematocrit: 43.8 % (ref 34.0–46.6)
Hemoglobin: 14 g/dL (ref 11.1–15.9)
MCH: 28.7 pg (ref 26.6–33.0)
MCHC: 32 g/dL (ref 31.5–35.7)
MCV: 90 fL (ref 79–97)
Platelets: 246 10*3/uL (ref 150–450)
RBC: 4.87 x10E6/uL (ref 3.77–5.28)
RDW: 12.8 % (ref 11.7–15.4)
WBC: 8 10*3/uL (ref 3.4–10.8)

## 2019-11-04 LAB — LIPID PANEL
Chol/HDL Ratio: 4.5 ratio — ABNORMAL HIGH (ref 0.0–4.4)
Cholesterol, Total: 209 mg/dL — ABNORMAL HIGH (ref 100–199)
HDL: 46 mg/dL (ref >39)
LDL Chol Calc (NIH): 136 mg/dL — ABNORMAL HIGH (ref 0–99)
Triglycerides: 153 mg/dL — ABNORMAL HIGH (ref 0–149)
VLDL Cholesterol Cal: 27 mg/dL (ref 5–40)

## 2019-11-04 LAB — MAGNESIUM: Magnesium: 2.2 mg/dL (ref 1.6–2.3)

## 2019-11-04 LAB — TSH: TSH: 2.92 u[IU]/mL (ref 0.450–4.500)

## 2019-11-04 NOTE — Telephone Encounter (Signed)
30 day Event monitor registered to be mailed to pt's home address.

## 2019-11-05 ENCOUNTER — Other Ambulatory Visit: Payer: Self-pay

## 2019-11-05 ENCOUNTER — Telehealth: Payer: Self-pay

## 2019-11-05 DIAGNOSIS — E785 Hyperlipidemia, unspecified: Secondary | ICD-10-CM

## 2019-11-05 DIAGNOSIS — Z79899 Other long term (current) drug therapy: Secondary | ICD-10-CM

## 2019-11-05 MED ORDER — EZETIMIBE 10 MG PO TABS
10.0000 mg | ORAL_TABLET | Freq: Every day | ORAL | 2 refills | Status: DC
Start: 2019-11-05 — End: 2020-02-08

## 2019-11-05 NOTE — Telephone Encounter (Addendum)
Called and reviewed labs with pt ( see result note ) prescription sent to pharmacy and lab slips mailed Pt has sooner appt on 01/06/20 at 12 pm

## 2019-11-05 NOTE — Telephone Encounter (Signed)
Follow Up  Patient is calling in to go over results. Please give a call back.

## 2019-11-05 NOTE — Telephone Encounter (Signed)
LVM2CB 7/1 regarding lab results. Also need to work pt in for appt in 2-3 months. Okay to use a 12pm or a 4:40pm slot

## 2019-11-06 ENCOUNTER — Other Ambulatory Visit: Payer: Self-pay

## 2019-11-06 ENCOUNTER — Ambulatory Visit (HOSPITAL_COMMUNITY): Payer: Medicare Other | Attending: Cardiology

## 2019-11-06 DIAGNOSIS — R7303 Prediabetes: Secondary | ICD-10-CM

## 2019-11-06 DIAGNOSIS — R55 Syncope and collapse: Secondary | ICD-10-CM | POA: Insufficient documentation

## 2019-11-06 DIAGNOSIS — K219 Gastro-esophageal reflux disease without esophagitis: Secondary | ICD-10-CM | POA: Insufficient documentation

## 2019-11-06 DIAGNOSIS — Z79899 Other long term (current) drug therapy: Secondary | ICD-10-CM | POA: Diagnosis not present

## 2019-11-19 ENCOUNTER — Encounter: Payer: Medicare Other | Admitting: Gastroenterology

## 2019-11-23 ENCOUNTER — Other Ambulatory Visit (HOSPITAL_COMMUNITY): Payer: Medicare Other

## 2019-11-28 ENCOUNTER — Ambulatory Visit (INDEPENDENT_AMBULATORY_CARE_PROVIDER_SITE_OTHER): Payer: Medicare Other

## 2019-11-28 DIAGNOSIS — Z79899 Other long term (current) drug therapy: Secondary | ICD-10-CM

## 2019-11-28 DIAGNOSIS — R7303 Prediabetes: Secondary | ICD-10-CM | POA: Diagnosis not present

## 2019-11-28 DIAGNOSIS — R55 Syncope and collapse: Secondary | ICD-10-CM

## 2019-11-28 DIAGNOSIS — K219 Gastro-esophageal reflux disease without esophagitis: Secondary | ICD-10-CM

## 2019-12-22 ENCOUNTER — Encounter: Payer: Self-pay | Admitting: Internal Medicine

## 2019-12-31 ENCOUNTER — Encounter: Payer: Self-pay | Admitting: Obstetrics and Gynecology

## 2019-12-31 ENCOUNTER — Other Ambulatory Visit: Payer: Self-pay

## 2019-12-31 ENCOUNTER — Ambulatory Visit (INDEPENDENT_AMBULATORY_CARE_PROVIDER_SITE_OTHER): Payer: Medicare Other | Admitting: Obstetrics and Gynecology

## 2019-12-31 VITALS — BP 120/74 | Ht 64.0 in | Wt 166.0 lb

## 2019-12-31 DIAGNOSIS — Z853 Personal history of malignant neoplasm of breast: Secondary | ICD-10-CM

## 2019-12-31 DIAGNOSIS — L578 Other skin changes due to chronic exposure to nonionizing radiation: Secondary | ICD-10-CM | POA: Diagnosis not present

## 2019-12-31 DIAGNOSIS — I878 Other specified disorders of veins: Secondary | ICD-10-CM | POA: Diagnosis not present

## 2019-12-31 DIAGNOSIS — Z01419 Encounter for gynecological examination (general) (routine) without abnormal findings: Secondary | ICD-10-CM

## 2019-12-31 DIAGNOSIS — Z86018 Personal history of other benign neoplasm: Secondary | ICD-10-CM | POA: Diagnosis not present

## 2019-12-31 DIAGNOSIS — N958 Other specified menopausal and perimenopausal disorders: Secondary | ICD-10-CM

## 2019-12-31 DIAGNOSIS — Z9289 Personal history of other medical treatment: Secondary | ICD-10-CM | POA: Diagnosis not present

## 2019-12-31 DIAGNOSIS — Z780 Asymptomatic menopausal state: Secondary | ICD-10-CM

## 2019-12-31 DIAGNOSIS — N952 Postmenopausal atrophic vaginitis: Secondary | ICD-10-CM

## 2019-12-31 DIAGNOSIS — L821 Other seborrheic keratosis: Secondary | ICD-10-CM | POA: Diagnosis not present

## 2019-12-31 DIAGNOSIS — D2262 Melanocytic nevi of left upper limb, including shoulder: Secondary | ICD-10-CM | POA: Diagnosis not present

## 2019-12-31 DIAGNOSIS — Z13820 Encounter for screening for osteoporosis: Secondary | ICD-10-CM

## 2019-12-31 DIAGNOSIS — D225 Melanocytic nevi of trunk: Secondary | ICD-10-CM | POA: Diagnosis not present

## 2019-12-31 DIAGNOSIS — L814 Other melanin hyperpigmentation: Secondary | ICD-10-CM | POA: Diagnosis not present

## 2019-12-31 NOTE — Progress Notes (Signed)
Lindsey Barrett 1946/02/07 [id number]  SUBJECTIVE:  74 y.o. G0P0000 female here for a breast and pelvic exam and Pap smear. She has no gynecologic concerns.  Current Outpatient Medications  Medication Sig Dispense Refill  . aspirin EC 81 MG tablet Take 1 tablet (81 mg total) by mouth daily.    . bimatoprost (LUMIGAN) 0.01 % SOLN Place 1 drop into both eyes at bedtime.    . brinzolamide (AZOPT) 1 % ophthalmic suspension 1 drop 2 (two) times daily.    . calcium carbonate (TUMS - DOSED IN MG ELEMENTAL CALCIUM) 500 MG chewable tablet Chew 1 tablet by mouth as needed for indigestion or heartburn (1000 mg).     . Carboxymethylcellulose Sodium (REFRESH LIQUIGEL OP) Apply 1 drop to eye 4 (four) times daily as needed.    . Cholecalciferol (VITAMIN D3 PO) Take 5,000 Int'l Units by mouth daily.     [name] ezetimibe (ZETIA) 10 MG tablet Take 1 tablet (10 mg total) by mouth daily. 30 tablet 2  . famotidine (PEPCID) 10 MG tablet Take 1 tablet (10 mg total) by mouth 2 (two) times daily. (Patient taking differently: Take 10 mg by mouth daily. )    . letrozole (FEMARA) 2.5 MG tablet Take 1 tablet by mouth once daily 90 tablet 3  . rosuvastatin (CRESTOR) 40 MG tablet Take 1 tablet by mouth once daily 90 tablet 1  . sodium chloride (MURO 128) 5 % ophthalmic solution Place 1 drop into both eyes 2 (two) times a day. Before Azopt     No current facility-administered medications for this visit.   Allergies: Arimidex [anastrozole], Septra ds [sulfamethoxazole-trimethoprim], Sulfa antibiotics, and Adhesive [tape]  No LMP recorded. Patient is postmenopausal.  Past medical history,surgical history, problem list, medications, allergies, family history and social history were all reviewed and documented as reviewed in the EPIC chart.  GYN ROS: no abnormal bleeding, pelvic pain or discharge, no breast pain or new or enlarging lumps on self exam.  No dysuria, urinary frequency, pain with urination, cloudy/malodorous  urine.   OBJECTIVE:  BP 120/74   Ht 5\' 4"  (1.626 m)   Wt 166 lb (75.3 kg)   BMI 28.49 kg/m  The patient appears well, alert, oriented, in no distress.  BREAST EXAM: Declined by patient as she recently had a breast exam  PELVIC EXAM: VULVA: normal appearing vulva with atrophic changes and with no masses, tenderness or lesions, VAGINA: normal appearing vagina with trophic change, normal color and discharge, no lesions, CERVIX: Difficult to view and patient somewhat intolerant of exam due to band of narrowing from vaginal atrophy circumferentially around the top of the vagina near the cervix, but from the view obtained there was a normal appearing atrophic cervix without discharge or lesions, UTERUS: uterus is normal size, shape, consistency and nontender, ADNEXA: normal adnexa in size, nontender and no masses  Chaperone: Aurora Mask (DNP student) present during the examination and performed the pelvic exam with me in attendance to confirm the exam findings  ASSESSMENT:  74 y.o. G0P0000 here for a breast and pelvic exam  PLAN:   1. Postmenopausal.  No significant hot flashes or night sweats.  No vaginal bleeding.  Using Replens moisturizer for vaginal dryness which I would recommend continuing to do at least twice per week. 2. Pap smear 2019.  No significant history of abnormal Pap smears.  Next Pap smear due 2022 if she does like to continue screening at that time.  Will readdress at future visit. 3.  History of left breast cancer.  Mammogram 06/2019.  Remains on letrozole.  We will continue to follow-up with oncology.   4. Colonoscopy 2017.  She will follow up at the interval recommended by her GI specialist.   5. History of HSV.  Occasional outbreaks.  Discussed options for Valtrex previously.  Patient had previously decided to not take any medication. 6. DEXA 12/2017 was normal.  States her oncologist recommended repeating the DEXA now and the test is now ordered to be completed here in  our office. 7. Health maintenance.  No labs today as she normally has these completed elsewhere.  Return annually or sooner, prn.  Joseph Pierini MD 12/31/19

## 2020-01-06 ENCOUNTER — Ambulatory Visit: Payer: Medicare Other | Admitting: Cardiovascular Disease

## 2020-01-07 DIAGNOSIS — H15833 Staphyloma posticum, bilateral: Secondary | ICD-10-CM | POA: Diagnosis not present

## 2020-01-07 DIAGNOSIS — H35371 Puckering of macula, right eye: Secondary | ICD-10-CM | POA: Diagnosis not present

## 2020-01-07 DIAGNOSIS — H25813 Combined forms of age-related cataract, bilateral: Secondary | ICD-10-CM | POA: Diagnosis not present

## 2020-01-07 DIAGNOSIS — Z853 Personal history of malignant neoplasm of breast: Secondary | ICD-10-CM | POA: Diagnosis not present

## 2020-01-07 DIAGNOSIS — H4423 Degenerative myopia, bilateral: Secondary | ICD-10-CM | POA: Diagnosis not present

## 2020-01-19 ENCOUNTER — Encounter: Payer: Self-pay | Admitting: Hematology and Oncology

## 2020-01-21 ENCOUNTER — Telehealth (INDEPENDENT_AMBULATORY_CARE_PROVIDER_SITE_OTHER): Payer: Medicare Other | Admitting: Cardiovascular Disease

## 2020-01-21 ENCOUNTER — Encounter: Payer: Self-pay | Admitting: Cardiovascular Disease

## 2020-01-21 DIAGNOSIS — I2510 Atherosclerotic heart disease of native coronary artery without angina pectoris: Secondary | ICD-10-CM | POA: Diagnosis not present

## 2020-01-21 DIAGNOSIS — K219 Gastro-esophageal reflux disease without esophagitis: Secondary | ICD-10-CM | POA: Diagnosis not present

## 2020-01-21 DIAGNOSIS — E785 Hyperlipidemia, unspecified: Secondary | ICD-10-CM | POA: Diagnosis not present

## 2020-01-21 DIAGNOSIS — R55 Syncope and collapse: Secondary | ICD-10-CM | POA: Diagnosis not present

## 2020-01-21 DIAGNOSIS — I700 Atherosclerosis of aorta: Secondary | ICD-10-CM

## 2020-01-21 NOTE — Progress Notes (Signed)
Virtual Visit via Video Note   This visit type was conducted due to national recommendations for restrictions regarding the COVID-19 Pandemic (e.g. social distancing) in an effort to limit this patient's exposure and mitigate transmission in our community.  Due to her co-morbid illnesses, this patient is at least at moderate risk for complications without adequate follow up.  This format is felt to be most appropriate for this patient at this time.  All issues noted in this document were discussed and addressed.  A limited physical exam was performed with this format.  Please refer to the patient's chart for her consent to telehealth for [facility].       Date:  01/26/2020   ID:  Lindsey Barrett, DOB Jul 06, 1945, MRN [id number] The patient was identified using 2 identifiers.  Patient Location: Home Provider Location: Office/Clinic  PCP:  [name], MD  Cardiologist:  Shelva Majestic, MD Electrophysiologist:  None   Evaluation Performed:  Follow-Up Visit  Chief Complaint:  3 month F/U  History of Present Illness:    Lindsey Barrett is a 74 y.o. female who had been a remote patient of Dr. Ignacia Palma, Dr. Elicia Lamp, and has recently been followed by Dr. Havery Moros and Dr. Christinia Gully.   I saw her for initial evaluation with me in June 2018 1 she was referred by  Nicoletta Ba for evaluation of several recent episodes of presyncope.  Lindsey Barrett denies any awareness of cardiac disease.  Remotely she had been told by Dr. Linna Darner that she had a systolic click on cardiac auscultation.  She has remote tobacco history and had smoked for 30 years from ages 50 until 68 but quit in 2000.  She is followed by Dr. Melvyn Novas for a stable 8 mm nodule in the right lung apex with well-circumscribed margins which has not changed from prior evaluations.  She states that she has experience of episodes where after she has eaten a large meal and sitting down she all of a sudden developed a wave of  presyncope.  There is no chest pain and it typically lasts seconds.  She has not frankly blacked out.  She was recently evaluated by Nicoletta Ba, PA-C last week and there was some uncertainty as to what these episodes represented raising possibilities for post prandial hypotension, postprandial vagus dysfunction and concerns for arrhythmia.  She is now referred for cardiology evaluation.  In addition to the above, past history is also notable for breast cancer in 2017, history of adenomatous colonic polyps with a last colonoscopy in 2017.  She also has undergone prior endoscopy evaluations with Dr. Maurene Capes.  Carotid studies done in 2019 had shown minimal homogeneous plaque without hemodynamically significant stenoses.  Her most recent chest CT on October 27, 2019 showed a stable 8 mm nodule in the right lung apex.  There was evidence for coronary artery and aortic atherosclerosis.   When I initially saw her, I recommended that she undergo a 2D echo Doppler study as well as a 30-day event monitor.  Her echo Doppler study from November 06, 2019 revealed an EF of 65 to 70% with mild LVH.  She had normal diastolic parameters.  There was no evidence for any significant valvular pathology.  A cardiac monitor was worn for 13 days and showed sinus rhythm with an average rate of 74 bpm.  The slowest heart rate was sinus bradycardia 54 bpm at 6:50 AM the fastest heart rate was sinus tachycardia at 8:27 AM on  July 25.  There were no episodes of atrial fibrillation, pauses, or ectopy.  Subsequently, she has felt well.  He denies experiencing any recurrent spells.  Her last episode was in May and she believes this may have occurred in the setting of some food poisoning.  She has reduced her intake and previously she was eating too much which resulted in abdominal cramping which has resolved.  The patient does not have symptoms concerning for COVID-19 infection (fever, chills, cough, or new shortness of breath).    Past  Medical History:  Diagnosis Date  . Anxiety   . Atrophic vaginitis   . Breast cancer ([place])   . 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[place]) 05/20/2015  . Breast cyst    x3  . DDD (degenerative disc disease)   . Diverticulosis   . Fasting hyperglycemia   . GERD (gastroesophageal reflux disease)   . Gilbert syndrome   . Glaucoma   . History of hiatal hernia   . HSV infection   . Hyperlipidemia   . Radiation    left breast 50.4 gray  . STD (sexually transmitted disease)    HSV   Past Surgical History:  Procedure Laterality Date  . APPENDECTOMY  1961  . BREAST CYST ASPIRATION     x2 right ,x1 left  . COLONOSCOPY  2007   neg  . DILATION AND CURETTAGE OF UTERUS  1975  . FLEXIBLE SIGMOIDOSCOPY     x2  . PELVIC LAPAROSCOPY    . RADIOACTIVE SEED GUIDED PARTIAL MASTECTOMY WITH AXILLARY SENTINEL LYMPH NODE BIOPSY Left 05/31/2015   Procedure: LEFT BREAST RADIOACTIVE SEED GUIDED LUMPECTOMY WITH LEFT AXILLARY SENTINEL LYMPH NODE BIOPSY;  Surgeon: [name], MD;  Location: [place];  Service: General;  Laterality: Left;  . RE-EXCISION OF BREAST LUMPECTOMY Left 07/01/2015   Procedure: RE-EXCISION OF LEFT BREAST INFERIOR MARGIN;  Surgeon: [name], MD;  Location: [place];  Service: General;  Laterality: Left;  . UPPER GASTROINTESTINAL ENDOSCOPY    . UPPER GI ENDOSCOPY  10/2010   Dr [name]  . WISDOM TOOTH EXTRACTION  1978     Current Meds  Medication Sig  . aspirin EC 81 MG tablet Take 1 tablet (81 mg total) by mouth daily.  . bimatoprost (LUMIGAN) 0.01 % SOLN Place 1 drop into both eyes at bedtime.  . brinzolamide (AZOPT) 1 % ophthalmic suspension 1 drop 2 (two) times daily.  . calcium carbonate (TUMS - DOSED IN MG ELEMENTAL CALCIUM) 500 MG chewable tablet Chew 1 tablet by mouth as needed for indigestion or heartburn (1000 mg).   . Carboxymethylcellulose Sodium (REFRESH LIQUIGEL OP) Apply 1 drop to eye 4 (four) times  daily as needed.  . Cholecalciferol (VITAMIN D3 PO) Take 5,000 Int'l Units by mouth daily.   [name] ezetimibe (ZETIA) 10 MG tablet Take 1 tablet (10 mg total) by mouth daily.  . famotidine (PEPCID) 10 MG tablet Take 1 tablet (10 mg total) by mouth 2 (two) times daily. (Patient taking differently: Take 10 mg by mouth daily. )  . letrozole (FEMARA) 2.5 MG tablet Take 1 tablet by mouth once daily  . rosuvastatin (CRESTOR) 40 MG tablet Take 1 tablet by mouth once daily  . sodium chloride (MURO 128) 5 % ophthalmic solution Place 1 drop into both eyes 2 (two) times a day. Before Azopt     Allergies:   Arimidex [anastrozole], Septra ds [sulfamethoxazole-trimethoprim], Sulfa antibiotics, and Adhesive [tape]   Social History  Tobacco Use  . Smoking status: Former Smoker    Types: Cigarettes    Quit date: 03/28/1999    Years since quitting: 20.8  . Smokeless tobacco: Never Used  . Tobacco comment: smoked 1967-2000, up to 3/4 ppd  Vaping Use  . Vaping Use: Never used  Substance Use Topics  . Alcohol use: Yes    Alcohol/week: 10.0 standard drinks    Types: 10 Cans of beer per week  . Drug use: No     Family Hx: The patient's family history includes Breast cancer in her paternal aunt; Cancer in her father and mother; Colon cancer in her paternal aunt; Coronary artery disease in her father; Endometriosis in her mother; Leukemia in her maternal uncle; Liver cancer in her maternal uncle; Lung cancer in her mother; Stomach cancer in her maternal grandmother; Stroke (age of onset: 27) in her paternal grandfather; Sudden death in her sister; Vascular Disease in her paternal grandmother. There is no history of Esophageal cancer, Rectal cancer, or Pancreatic cancer.  ROS:   Please see the history of present illness.    No fevers chills or night sweats No further episodes of presyncope or palpitations No chest pain Breast cancer stable No recent abdominal pain.   No swelling.   Sleeping well All other  systems reviewed and are negative.   Prior CV studies:   The following studies were reviewed today:  ECHO 11/06/2019 IMPRESSIONS  1. Left ventricular ejection fraction, by estimation, is 65 to 70%. The  left ventricle has normal function. The left ventricle has no regional  wall motion abnormalities. There is mild left ventricular hypertrophy.  Left ventricular diastolic parameters  were normal.  2. Right ventricular systolic function is normal. The right ventricular  size is normal. Tricuspid regurgitation signal is inadequate for assessing  PA pressure.  3. The mitral valve is normal in structure. No evidence of mitral valve  regurgitation.  4. The aortic valve is tricuspid. Aortic valve regurgitation is not  visualized. No aortic stenosis is present.  5. The inferior vena cava is normal in size with greater than 50%  respiratory variability, suggesting right atrial pressure of 3 mmHg.   Study Highlights  The patient wore an event monitor for 13 days from November 28, 2019 through December 10, 2019.  The patient was in sinus rhythm with an average rate at 74 bpm.  Slowest sinus rhythm was sinus bradycardia 54 bpm at 6:50 AM on July 25 and the fastest sinus rhythm was sinus tachycardia 109 bpm and 8:27 AM on July 25.  There were no episodes of atrial fibrillation.  There was no ectopy.  There were no prolonged pauses.    Labs/Other Tests and Data Reviewed:    EKG:  An ECG dated 11/02/2019 was personally reviewed today and demonstrated:  Normal sinus rhythm at 72 bpm, low voltage, nonspecific T wave abnormality.  Normal intervals  Recent Labs: 11/04/2019: ALT 15; BUN 10; Creatinine, Ser 0.63; Hemoglobin 14.0; Magnesium 2.2; Platelets 246; Potassium 4.5; Sodium 142; TSH 2.920   Recent Lipid Panel Lab Results  Component Value Date/Time   CHOL 209 (H) 11/04/2019 10:05 AM   CHOL 216 (H) 06/18/2014 10:53 AM   TRIG 153 (H) 11/04/2019 10:05 AM   TRIG 110 06/18/2014 10:53 AM   HDL 46  11/04/2019 10:05 AM   HDL 52 06/18/2014 10:53 AM   CHOLHDL 4.5 (H) 11/04/2019 10:05 AM   CHOLHDL 4 01/22/2019 10:08 AM   LDLCALC 136 (H) 11/04/2019 10:05 AM  LDLCALC 142 (H) 06/18/2014 10:53 AM   LDLDIRECT 148.9 09/23/2009 08:35 AM    Wt Readings from Last 3 Encounters:  01/21/20 163 lb 9.6 oz (74.2 kg)  12/31/19 166 lb (75.3 kg)  11/02/19 165 lb 3.2 oz (74.9 kg)     Objective:    Vital Signs:  Ht 5\' 4"  (1.626 m)   Wt 163 lb 9.6 oz (74.2 kg)   BMI 28.08 kg/m    The patient was unable to have blood pressure recorded today. Visually her appearance was unchanged. Breathing was normal and not labored No audible wheezing Normal heart rate by her palpation without heart rate irregularity No chest wall discomfort  No abdominal discomfort No edema Normal affect   ASSESSMENT & PLAN:    Encounter Diagnoses  Name Primary?  . Pre-syncope   . Aortic atherosclerosis ([place])   . Atherosclerosis of native coronary artery of native heart without angina pectoris   . Hyperlipidemia with target LDL less than 70   . Gastroesophageal reflux disease without esophagitis     Lindsey Barrett is a 74 year old female who had experienced prior episode following eating where she would experience a wave of presyncope normal to pass out.  At the time she admits that she was eating excessively and she would have significant abdominal cramping.  She was told that these episodes were concerning for possible postprandial transient hypotension versus postprandial vagus nerve dysfunction or possible arrhythmia.  I reviewed her echo Doppler study today in detail which showed hyperdynamic LV function with EF estimated 65 to 70%..  She mild LVH.  Her cardiac monitor is essentially unremarkable and did not reveal any ectopy, pauses, episodes of atrial fibrillation.  Her symptoms essentially have resolved since she is significantly increased amount of fluid that she is eating at a given time. She states her  blood pressures recently have been in the range of 116/68 to 20/70, although she did not have capability to check her blood pressure today.  Presently she feels well.  With her previous documentation of aortic and coronary calcification he is aggressively being treated with lipid management on Zetia 10 mg in addition to rosuvastatin 40 mg with target LDL less than 70 progression.  LDL cholesterol in June 2021 was elevated at 136.  She will continue current therapy.  I will see her in 1 year for reevaluation or sooner as needed.   COVID-19 Education: The signs and symptoms of COVID-19 were discussed with the patient and how to seek care for testing (follow up with PCP or arrange E-visit).  The importance of social distancing was discussed today.  Time:   Today, I have spent 18 minutes with the patient with telehealth technology discussing the above problems.     Medication Adjustments/Labs and Tests Ordered: Current medicines are reviewed at length with the patient today.  Concerns regarding medicines are outlined above.   Tests Ordered: No orders of the defined types were placed in this encounter.   Medication Changes: No orders of the defined types were placed in this encounter.   Follow Up:  One year  Signed, Shelva Majestic, MD  01/26/2020 4:24 PM    [place]

## 2020-01-21 NOTE — Patient Instructions (Signed)

## 2020-01-26 ENCOUNTER — Encounter: Payer: Self-pay | Admitting: Cardiovascular Disease

## 2020-01-27 ENCOUNTER — Encounter: Payer: Self-pay | Admitting: Obstetrics and Gynecology

## 2020-02-02 ENCOUNTER — Encounter: Payer: Self-pay | Admitting: Internal Medicine

## 2020-02-08 ENCOUNTER — Other Ambulatory Visit: Payer: Self-pay | Admitting: Cardiovascular Disease

## 2020-02-11 ENCOUNTER — Ambulatory Visit: Payer: Medicare Other

## 2020-02-14 NOTE — Progress Notes (Signed)
Subjective:    Patient ID: Lindsey Barrett, female    DOB: 1946-03-31, 74 y.o.   MRN: [id number]  HPI The patient is here for follow up of their chronic medical problems, including hyperlipidemia, prediabetes, GERD and breast cancer   She is taking all of her medications as prescribed.    She is exercising some.    She is active in the yard and does housework.   If she eats rich foods, hot and spicy foods or eats too late she will get symptomatic - has not had any symptoms for a few months.   She has gone to a mediterranean diet and has lost weight.    Medications and allergies reviewed with patient and updated if appropriate.  Patient Active Problem List   Diagnosis Date Noted  . Sensorineural hearing loss (SNHL) of both ears 03/20/2019  . Deviated nasal septum 03/02/2019  . Pulmonary nodule 07/29/2018  . Dupuytren's contracture of both hands 07/11/2018  . Ganglion cyst of volar aspect of left wrist 06/04/2018  . 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 Epistaxis 02/05/2018  . Right rotator cuff tear 10/15/2017  . Tinnitus of both ears 01/01/2017  . Reaction to internal stress 05/29/2016  . Prediabetes 08/10/2015  . 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[place]) 05/20/2015  . Open-angle glaucoma 06/17/2014  . Vitamin D deficiency 10/18/2011  . HSV infection   . Atrophic vaginitis   . VARICOSE VEINS, LOWER EXTREMITIES 09/28/2009  . GERD 07/14/2009  . Hyperlipidemia 12/26/2006  . GILBERT'S SYNDROME 08/09/2006  . DEGENERATIVE DISC DISEASE 08/09/2006    Current Outpatient Medications on File Prior to Visit  Medication Sig Dispense Refill  . aspirin EC 81 MG tablet Take 1 tablet (81 mg total) by mouth daily.    . bimatoprost (LUMIGAN) 0.01 % SOLN Place 1 drop into both eyes at bedtime.    . brinzolamide (AZOPT) 1 % ophthalmic suspension 1 drop 2 (two) times daily.    . calcium carbonate (TUMS - DOSED IN MG ELEMENTAL CALCIUM) 500 MG chewable tablet Chew 1  tablet by mouth as needed for indigestion or heartburn (1000 mg).     . Carboxymethylcellulose Sodium (REFRESH LIQUIGEL OP) Apply 1 drop to eye 4 (four) times daily as needed.    . Cholecalciferol (VITAMIN D3 PO) Take 5,000 Int'l Units by mouth daily.     [name] ezetimibe (ZETIA) 10 MG tablet Take 1 tablet by mouth once daily 30 tablet 11  . famotidine (PEPCID) 10 MG tablet Take 1 tablet (10 mg total) by mouth 2 (two) times daily. (Patient taking differently: Take 10 mg by mouth daily. )    . letrozole (FEMARA) 2.5 MG tablet Take 1 tablet by mouth once daily 90 tablet 3  . sodium chloride (MURO 128) 5 % ophthalmic solution Place 1 drop into both eyes 2 (two) times a day. Before Azopt    . [DISCONTINUED] metoprolol tartrate (LOPRESSOR) 25 MG tablet Take 1 tablet (25 mg total) by mouth as directed. 1 pill every 8 hours as needed for tachycardia or palpitations. 30 tablet 2   No current facility-administered medications on file prior to visit.    Past Medical History:  Diagnosis Date  . Anxiety   . Atrophic vaginitis   . Breast cancer ([place])   . 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[place]) 05/20/2015  . Breast cyst    x3  . DDD (degenerative disc disease)   . Diverticulosis   .  Fasting hyperglycemia   . GERD (gastroesophageal reflux disease)   . Gilbert syndrome   . Glaucoma   . History of hiatal hernia   . HSV infection   . Hyperlipidemia   . Radiation    left breast 50.4 gray  . STD (sexually transmitted disease)    HSV    Past Surgical History:  Procedure Laterality Date  . APPENDECTOMY  1961  . BREAST CYST ASPIRATION     x2 right ,x1 left  . COLONOSCOPY  2007   neg  . DILATION AND CURETTAGE OF UTERUS  1975  . FLEXIBLE SIGMOIDOSCOPY     x2  . PELVIC LAPAROSCOPY    . RADIOACTIVE SEED GUIDED PARTIAL MASTECTOMY WITH AXILLARY SENTINEL LYMPH NODE BIOPSY Left 05/31/2015   Procedure: LEFT BREAST RADIOACTIVE SEED GUIDED LUMPECTOMY WITH LEFT AXILLARY SENTINEL LYMPH  NODE BIOPSY;  Surgeon: [name], MD;  Location: [place];  Service: General;  Laterality: Left;  . RE-EXCISION OF BREAST LUMPECTOMY Left 07/01/2015   Procedure: RE-EXCISION OF LEFT BREAST INFERIOR MARGIN;  Surgeon: [name], MD;  Location: [place];  Service: General;  Laterality: Left;  . UPPER GASTROINTESTINAL ENDOSCOPY    . UPPER GI ENDOSCOPY  10/2010   Dr [name]  . WISDOM TOOTH EXTRACTION  1978    Social History   Socioeconomic History  . Marital status: Divorced    Spouse name: Not on file  . Number of children: 0  . Years of education: Not on file  . Highest education level: Not on file  Occupational History  . 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 Smoking status: Former Smoker    Types: Cigarettes    Quit date: 03/28/1999    Years since quitting: 20.9  . Smokeless tobacco: Never Used  . Tobacco comment: smoked 1967-2000, up to 3/4 ppd  Vaping Use  . Vaping Use: Never used  Substance and Sexual Activity  . Alcohol use: Yes    Alcohol/week: 10.0 standard drinks    Types: 10 Cans of beer per week  . Drug use: No  . Sexual activity: Not Currently    Birth control/protection: Post-menopausal    Comment: 1st intercourse 74 yo-More than 5 partners  Other Topics Concern  . 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      Regular exercise-no   Social Determinants of Health   Financial Resource Strain: Low Risk   . Difficulty of Paying Living Expenses: Not hard at all  Food Insecurity: No Food Insecurity  . Worried About Charity fundraiser in the Last Year: Never true  . Ran Out of Food in the Last Year: Never true  Transportation Needs: No Transportation Needs  . Lack of Transportation (Medical): No  . Lack of Transportation (Non-Medical): No  Physical Activity: Sufficiently Active  . Days of Exercise per Week: 5 days  . Minutes of  Exercise per Session: 30 min  Stress: No Stress Concern Present  . Feeling of Stress : Not at all  Social Connections: Moderately Integrated  . Frequency of Communication with Friends and Family: More than three times a week  . Frequency of Social Gatherings with Friends and Family: More than three times a week  . Attends Religious Services: More than 4 times per year  . Active Member of Clubs or Organizations: Yes  . Attends Archivist Meetings: More  than 4 times per year  . Marital Status: Divorced    Family History  Problem Relation Age of Onset  . Lung cancer Mother        smoker  . Endometriosis Mother   . Cancer Mother        Bladder cancer  . Stomach cancer Maternal Grandmother   . Coronary artery disease Father   . Cancer Father        intestinal carcinoid tumors  . Sudden death Sister        58 week old, congenital deformity  . Liver cancer Maternal Uncle   . Stroke Paternal Grandfather 17  . Leukemia Maternal Uncle   . Breast cancer Paternal Aunt        great paternal aunt-Age 23's  . Colon cancer Paternal Aunt        lived to 37; pat great aunt  . Vascular Disease Paternal Grandmother        thoracic aneurysm  . Esophageal cancer Neg Hx   . Rectal cancer Neg Hx   . Pancreatic cancer Neg Hx     Review of Systems  Constitutional: Negative for chills and fever.  Respiratory: Negative for cough, shortness of breath and wheezing.   Cardiovascular: Negative for chest pain, palpitations and leg swelling.  Neurological: Positive for headaches (sinus headache for a few days this past week). Negative for light-headedness.       Objective:   Vitals:   02/15/20 1445  BP: 120/70  Pulse: 76  Temp: 97.9 F (36.6 C)  SpO2: 98%   BP Readings from Last 3 Encounters:  02/15/20 120/70  02/15/20 120/70  12/31/19 120/74   Wt Readings from Last 3 Encounters:  02/15/20 164 lb (74.4 kg)  02/15/20 164 lb 6.4 oz (74.6 kg)  01/21/20 163 lb 9.6 oz (74.2 kg)    Body mass index is 28.15 kg/m.   Physical Exam    Constitutional: Appears well-developed and well-nourished. No distress.  HENT:  Head: Normocephalic and atraumatic.  Neck: Neck supple. No tracheal deviation present. No thyromegaly present.  No cervical lymphadenopathy Cardiovascular: Normal rate, regular rhythm and normal heart sounds.   No murmur heard. No carotid bruit .  No edema Pulmonary/Chest: Effort normal and breath sounds normal. No respiratory distress. No has no wheezes. No rales.  Skin: Skin is warm and dry. Not diaphoretic.  Psychiatric: Normal mood and affect. Behavior is normal.      Assessment & Plan:   HM - flu vaccine today, advised shingrix          covid booster at some point.    See Problem List for Assessment and Plan of chronic medical problems.    This visit occurred during the SARS-CoV-2 public health emergency.  Safety protocols were in place, including screening questions prior to the visit, additional usage of staff PPE, and extensive cleaning of exam room while observing appropriate contact time as indicated for disinfecting solutions.

## 2020-02-14 NOTE — Patient Instructions (Addendum)
  Blood work was ordered.     Flu immunization administered today.     Medications reviewed and updated.  Changes include :   None   Your prescription(s) have been submitted to your pharmacy. Please take as directed and contact our office if you believe you are having problem(s) with the medication(s).    Please followup in 1 year

## 2020-02-15 ENCOUNTER — Other Ambulatory Visit: Payer: Self-pay

## 2020-02-15 ENCOUNTER — Encounter: Payer: Self-pay | Admitting: Internal Medicine

## 2020-02-15 ENCOUNTER — Ambulatory Visit (INDEPENDENT_AMBULATORY_CARE_PROVIDER_SITE_OTHER): Payer: Medicare Other

## 2020-02-15 ENCOUNTER — Ambulatory Visit (INDEPENDENT_AMBULATORY_CARE_PROVIDER_SITE_OTHER): Payer: Medicare Other | Admitting: Internal Medicine

## 2020-02-15 VITALS — BP 120/70 | HR 76 | Temp 97.9°F | Ht 64.0 in | Wt 164.0 lb

## 2020-02-15 VITALS — BP 120/70 | HR 76 | Temp 97.3°F | Resp 16 | Ht 64.0 in | Wt 164.4 lb

## 2020-02-15 DIAGNOSIS — R7303 Prediabetes: Secondary | ICD-10-CM | POA: Diagnosis not present

## 2020-02-15 DIAGNOSIS — K219 Gastro-esophageal reflux disease without esophagitis: Secondary | ICD-10-CM

## 2020-02-15 DIAGNOSIS — Z23 Encounter for immunization: Secondary | ICD-10-CM

## 2020-02-15 DIAGNOSIS — E782 Mixed hyperlipidemia: Secondary | ICD-10-CM

## 2020-02-15 DIAGNOSIS — Z0000 Encounter for general adult medical examination without abnormal findings: Secondary | ICD-10-CM

## 2020-02-15 DIAGNOSIS — I2510 Atherosclerotic heart disease of native coronary artery without angina pectoris: Secondary | ICD-10-CM | POA: Diagnosis not present

## 2020-02-15 MED ORDER — ROSUVASTATIN CALCIUM 40 MG PO TABS: 40.0000 mg | ORAL_TABLET | Freq: Every day | ORAL | 3 refills | Status: DC

## 2020-02-15 NOTE — Assessment & Plan Note (Signed)
Chronic GERD controlled Continue pepcid 10 mg daily

## 2020-02-15 NOTE — Progress Notes (Signed)
Subjective:   Lindsey Barrett is a 74 y.o. female who presents for Medicare Annual (Subsequent) preventive examination.  Review of Systems    NO ROS. Medicare Wellness Visit. Cardiac Risk Factors include: advanced age (>99men, >75 women);dyslipidemia;family history of premature cardiovascular disease     Objective:    Today's Vitals   02/15/20 1323  BP: 120/70  Pulse: 76  Resp: 16  Temp: (!) 97.3 F (36.3 C)  SpO2: 98%  Weight: 164 lb 6.4 oz (74.6 kg)  Height: 5\' 4"  (1.626 m)  PainSc: 0-No pain   Body mass index is 28.22 kg/m.  Advanced Directives 02/15/2020 01/16/2019 07/14/2018 05/12/2018 01/10/2018 11/29/2017 01/01/2017  Does Patient Have a Medical Advance Directive? Yes Yes Yes No Yes Yes Yes  Type of Advance Directive - [place];Living will [place];Living will - [place];Living will [place];Living will [place];Living will  Does patient want to make changes to medical advance directive? No - Patient declined - No - Patient declined - - No - Patient declined -  Copy of [place] in Chart? - Yes - validated most recent copy scanned in chart (See row information) No - copy requested - Yes Yes No - copy requested  Would patient like information on creating a medical advance directive? - - - No - Patient declined - - -    Current Medications (verified) Outpatient Encounter Medications as of 02/15/2020  Medication Sig  . aspirin EC 81 MG tablet Take 1 tablet (81 mg total) by mouth daily.  . bimatoprost (LUMIGAN) 0.01 % SOLN Place 1 drop into both eyes at bedtime.  . brinzolamide (AZOPT) 1 % ophthalmic suspension 1 drop 2 (two) times daily.  . calcium carbonate (TUMS - DOSED IN MG ELEMENTAL CALCIUM) 500 MG chewable tablet Chew 1 tablet by mouth as needed for indigestion or heartburn (1000 mg).   . Carboxymethylcellulose Sodium (REFRESH LIQUIGEL OP) Apply 1  drop to eye 4 (four) times daily as needed.  . Cholecalciferol (VITAMIN D3 PO) Take 5,000 Int'l Units by mouth daily.   [name] ezetimibe (ZETIA) 10 MG tablet Take 1 tablet by mouth once daily  . famotidine (PEPCID) 10 MG tablet Take 1 tablet (10 mg total) by mouth 2 (two) times daily. (Patient taking differently: Take 10 mg by mouth daily. )  . letrozole (FEMARA) 2.5 MG tablet Take 1 tablet by mouth once daily  . sodium chloride (MURO 128) 5 % ophthalmic solution Place 1 drop into both eyes 2 (two) times a day. Before Azopt  . [DISCONTINUED] metoprolol tartrate (LOPRESSOR) 25 MG tablet Take 1 tablet (25 mg total) by mouth as directed. 1 pill every 8 hours as needed for tachycardia or palpitations.  . [DISCONTINUED] rosuvastatin (CRESTOR) 40 MG tablet Take 1 tablet by mouth once daily   No facility-administered encounter medications on file as of 02/15/2020.    Allergies (verified) Arimidex [anastrozole], Septra ds [sulfamethoxazole-trimethoprim], Sulfa antibiotics, Adhesive [tape], and Other   History: Past Medical History:  Diagnosis Date  . Anxiety   . Atrophic vaginitis   . Breast cancer ([place])   . 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[place]) 05/20/2015  . Breast cyst    x3  . DDD (degenerative disc disease)   . Diverticulosis   . Fasting hyperglycemia   . GERD (gastroesophageal reflux disease)   . Gilbert syndrome   . Glaucoma   . History of hiatal  hernia   . HSV infection   . Hyperlipidemia   . Radiation    left breast 50.4 gray  . STD (sexually transmitted disease)    HSV   Past Surgical History:  Procedure Laterality Date  . APPENDECTOMY  1961  . BREAST CYST ASPIRATION     x2 right ,x1 left  . COLONOSCOPY  2007   neg  . DILATION AND CURETTAGE OF UTERUS  1975  . FLEXIBLE SIGMOIDOSCOPY     x2  . PELVIC LAPAROSCOPY    . RADIOACTIVE SEED GUIDED PARTIAL MASTECTOMY WITH AXILLARY SENTINEL LYMPH NODE BIOPSY Left 05/31/2015   Procedure: LEFT BREAST  RADIOACTIVE SEED GUIDED LUMPECTOMY WITH LEFT AXILLARY SENTINEL LYMPH NODE BIOPSY;  Surgeon: [name], MD;  Location: [facility];  Service: General;  Laterality: Left;  . RE-EXCISION OF BREAST LUMPECTOMY Left 07/01/2015   Procedure: RE-EXCISION OF LEFT BREAST INFERIOR MARGIN;  Surgeon: [name], MD;  Location: [facility];  Service: General;  Laterality: Left;  . UPPER GASTROINTESTINAL ENDOSCOPY    . UPPER GI ENDOSCOPY  10/2010   Dr [name]  . WISDOM TOOTH EXTRACTION  1978   Family History  Problem Relation Age of Onset  . Lung cancer Mother        smoker  . Endometriosis Mother   . Cancer Mother        Bladder cancer  . Stomach cancer Maternal Grandmother   . Coronary artery disease Father   . Cancer Father        intestinal carcinoid tumors  . Sudden death Sister        81 week old, congenital deformity  . Liver cancer Maternal Uncle   . Stroke Paternal Grandfather 36  . Leukemia Maternal Uncle   . Breast cancer Paternal Aunt        great paternal aunt-Age 24's  . Colon cancer Paternal Aunt        lived to 61; pat great aunt  . Vascular Disease Paternal Grandmother        thoracic aneurysm  . Esophageal cancer Neg Hx   . Rectal cancer Neg Hx   . Pancreatic cancer Neg Hx    Social History   Socioeconomic History  . Marital status: Divorced    Spouse name: Not on file  . Number of children: 0  . Years of education: Not on file  . Highest education level: Not on file  Occupational History  . 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 Smoking status: Former Smoker    Types: Cigarettes    Quit date: 03/28/1999    Years since quitting: 20.9  . Smokeless tobacco: Never Used  . Tobacco comment: smoked 1967-2000, up to 3/4 ppd  Vaping Use  . Vaping Use: Never used  Substance and Sexual Activity  . Alcohol use: Yes    Alcohol/week: 10.0 standard drinks    Types: 10 Cans of beer per week  . Drug use: No    . Sexual activity: Not Currently    Birth control/protection: Post-menopausal    Comment: 1st intercourse 74 yo-More than 5 partners  Other Topics Concern  . 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      Regular exercise-no   Social Determinants of Health   Financial Resource Strain: Low Risk   . Difficulty of Paying Living Expenses: Not hard at all  Food  Insecurity: No Food Insecurity  . Worried About Charity fundraiser in the Last Year: Never true  . Ran Out of Food in the Last Year: Never true  Transportation Needs: No Transportation Needs  . Lack of Transportation (Medical): No  . Lack of Transportation (Non-Medical): No  Physical Activity: Sufficiently Active  . Days of Exercise per Week: 5 days  . Minutes of Exercise per Session: 30 min  Stress: No Stress Concern Present  . Feeling of Stress : Not at all  Social Connections: Moderately Integrated  . Frequency of Communication with Friends and Family: More than three times a week  . Frequency of Social Gatherings with Friends and Family: More than three times a week  . Attends Religious Services: More than 4 times per year  . Active Member of Clubs or Organizations: Yes  . Attends Archivist Meetings: More than 4 times per year  . Marital Status: Divorced    Tobacco Counseling Counseling given: Not Answered Comment: smoked 1967-2000, up to 3/4 ppd   Clinical Intake:  Pre-visit preparation completed: Yes  Pain : No/denies pain Pain Score: 0-No pain     BMI - recorded: 28.15 Nutritional Status: BMI 25 -29 Overweight Nutritional Risks: None Diabetes: No  How often do you need to have someone help you when you read instructions, pamphlets, or other written materials from your doctor or pharmacy?: 1 - Never What is the last grade level you completed in school?: Master's degree in Business  Diabetic? no  Interpreter Needed?:  No  Information entered by :: Aideliz Garmany N. Jerick Khachatryan, LPN   Activities of Daily Living In your present state of health, do you have any difficulty performing the following activities: 02/15/2020 02/15/2020  Hearing? N N  Vision? N N  Difficulty concentrating or making decisions? N N  Walking or climbing stairs? N N  Dressing or bathing? N N  Doing errands, shopping? N N  Preparing Food and eating ? N -  Using the Toilet? N -  In the past six months, have you accidently leaked urine? N -  Do you have problems with loss of bowel control? N -  Managing your Medications? N -  Managing your Finances? N -  Housekeeping or managing your Housekeeping? N -  Some recent data might be hidden    Patient Care Team: [name], MD as PCP - General (Internal Medicine) Fontaine, Belinda Block, MD (Inactive) as Consulting Physician (Gynecology) Armbruster, Carlota Raspberry, MD as Consulting Physician (Gastroenterology) Calvert Cantor, MD as Consulting Physician (Ophthalmology)  Indicate any recent Medical Services you may have received from other than [facility] providers in the past year (date may be approximate).     Assessment:   This is a routine wellness examination for Lindsey Barrett.  Hearing/Vision screen No exam data present  Dietary issues and exercise activities discussed: Current Exercise Habits: Home exercise routine, Type of exercise: walking;Other - see comments (loves to hike; camping, etc.), Time (Minutes): 30, Frequency (Times/Week): 7, Weekly Exercise (Minutes/Week): 210, Intensity: Moderate, Exercise limited by: None identified  Goals    . Patient Stated     Lose weight by monitor my diet closer, use portion control, increase the amount of exercise I do. Change my sleep pattern by going to bed earlier and getting up earlier.    . Patient Stated      Depression Screen PHQ 2/9 Scores 02/15/2020 01/16/2019 01/10/2018 01/01/2017 10/27/2015 07/13/2015 06/10/2014  PHQ - 2 Score 0 1 0 0 0  0 0  PHQ- 9  Score - - 2 2 - - -  Exception Documentation - - - - - - Patient refusal    Fall Risk Fall Risk  02/15/2020 01/16/2019 01/10/2018 01/01/2017 10/27/2015  Falls in the past year? 0 0 No No No  Number falls in past yr: 0 0 - - -  Injury with Fall? 0 0 - - -  Risk for fall due to : No Fall Risks - - - -  Follow up Falls evaluation completed - - - -    Any stairs in or around the home? Yes  If so, are there any without handrails? No  Home free of loose throw rugs in walkways, pet beds, electrical cords, etc? Yes  Adequate lighting in your home to reduce risk of falls? Yes   ASSISTIVE DEVICES UTILIZED TO PREVENT FALLS:  Life alert? No  Use of a cane, walker or w/c? No  Grab bars in the bathroom? Yes  Shower chair or bench in shower? Yes  Elevated toilet seat or a handicapped toilet? Yes   TIMED UP AND GO:  Was the test performed? No .  Length of time to ambulate 10 feet: 0 sec.   Gait steady and fast without use of assistive device  Cognitive Function:     6CIT Screen 02/15/2020 01/16/2019  What Year? 0 points -  What month? 0 points 0 points  What time? 0 points 0 points  Count back from 20 0 points 0 points  Months in reverse 0 points 0 points  Repeat phrase 0 points 0 points  Total Score 0 -    Immunizations Immunization History  Administered Date(s) Administered  . Fluad Quad(high Dose 65+) 01/22/2019  . Hepatitis A, Adult 02/17/2016, 07/11/2018, 01/16/2019  . Influenza Split 04/18/2011  . Influenza, High Dose Seasonal PF 05/26/2013, 01/27/2016, 02/26/2017, 02/05/2018  . Influenza, Seasonal, Injecte, Preservative Fre 04/17/2012  . PFIZER SARS-COV-2 Vaccination 06/12/2019, 07/07/2019  . Pneumococcal Conjugate-13 02/17/2016  . Pneumococcal Polysaccharide-23 07/02/2012  . Tdap 08/02/2010    TDAP status: Up to date Flu Vaccine status: Up to date Pneumococcal vaccine status: Up to date Covid-19 vaccine status: Completed vaccines  Qualifies for Shingles Vaccine? Yes    Zostavax completed No   Shingrix Completed?: No.    Education has been provided regarding the importance of this vaccine. Patient has been advised to call insurance company to determine out of pocket expense if they have not yet received this vaccine. Advised may also receive vaccine at local pharmacy or Health Dept. Verbalized acceptance and understanding.  Screening Tests Health Maintenance  Topic Date Due  . INFLUENZA VACCINE  12/06/2019  . TETANUS/TDAP  08/01/2020  . DEXA SCAN  12/26/2020  . COLONOSCOPY  02/12/2021  . MAMMOGRAM  06/10/2021  . COVID-19 Vaccine  Completed  . Hepatitis C Screening  Completed  . PNA vac Low Risk Adult  Completed    Health Maintenance  Health Maintenance Due  Topic Date Due  . INFLUENZA VACCINE  12/06/2019    Colorectal cancer screening: Completed 02/13/2016. Repeat every 5 years Mammogram status: Completed 06/11/2019. Repeat every year Bone Density status: Completed 12/26/2017. Results reflect: Bone density results: NORMAL. Repeat every 3 years.  Lung Cancer Screening: (Low Dose CT Chest recommended if Age 47-80 years, 30 pack-year currently smoking OR have quit w/in 15years.) does not qualify.   Lung Cancer Screening Referral: no  Additional Screening:  Hepatitis C Screening: does qualify; Completed no  Vision Screening: Recommended annual  ophthalmology exams for early detection of glaucoma and other disorders of the eye. Is the patient up to date with their annual eye exam?  Yes  Who is the provider or what is the name of the office in which the patient attends annual eye exams? [facility] If pt is not established with a provider, would they like to be referred to a provider to establish care? No .   Dental Screening: Recommended annual dental exams for proper oral hygiene  Community Resource Referral / Chronic Care Management: CRR required this visit?  No   CCM required this visit?  No      Plan:     I have personally  reviewed and noted the following in the patient's chart:   . Medical and social history . Use of alcohol, tobacco or illicit drugs  . Current medications and supplements . Functional ability and status . Nutritional status . Physical activity . Advanced directives . List of other physicians . Hospitalizations, surgeries, and ER visits in previous 12 months . Vitals . Screenings to include cognitive, depression, and falls . Referrals and appointments  In addition, I have reviewed and discussed with patient certain preventive protocols, quality metrics, and best practice recommendations. A written personalized care plan for preventive services as well as general preventive health recommendations were provided to patient.     Sheral Flow, LPN   [id number]   Nurse Notes: n/a

## 2020-02-15 NOTE — Patient Instructions (Addendum)
Lindsey Barrett , Thank you for taking time to come for your Medicare Wellness Visit. I appreciate your ongoing commitment to your health goals. Please review the following plan we discussed and let me know if I can assist you in the future.   Screening recommendations/referrals: Colonoscopy: 02/13/2016; due every 5 years Mammogram: 06/11/2019; due every year Bone Density: 12/26/2017; due every 3 years Recommended yearly ophthalmology/optometry visit for glaucoma screening and checkup Recommended yearly dental visit for hygiene and checkup  Vaccinations: Influenza vaccine: 02/15/2020 Pneumococcal vaccine: complete Tdap vaccine: 08/02/2010; due every 10 years Shingles vaccine: never done   Covid-19: completred  Advanced directives: Documents on file.  Conditions/risks identified: Yes; Reviewed health maintenance screenings with patient today and relevant education, vaccines, and/or referrals were provided. Please continue to do your personal lifestyle choices by: daily care of teeth and gums, regular physical activity (goal should be 5 days a week for 30 minutes), eat a healthy diet, avoid tobacco and drug use, limiting any alcohol intake, taking a low-dose aspirin (if not allergic or have been advised by your provider otherwise) and taking vitamins and minerals as recommended by your provider. Continue doing brain stimulating activities (puzzles, reading, adult coloring books, staying active) to keep memory sharp. Continue to eat heart healthy diet (full of fruits, vegetables, whole grains, lean protein, water--limit salt, fat, and sugar intake) and increase physical activity as tolerated.  Next appointment: Please schedule your next Medicare Wellness Visit with your Nurse Health Advisor in 1 year by calling [phone].   Preventive Care 72 Years and Older, Female Preventive care refers to lifestyle choices and visits with your health care provider that can promote health and wellness. What does  preventive care include?  A yearly physical exam. This is also called an annual well check.  Dental exams once or twice a year.  Routine eye exams. Ask your health care provider how often you should have your eyes checked.  Personal lifestyle choices, including:  Daily care of your teeth and gums.  Regular physical activity.  Eating a healthy diet.  Avoiding tobacco and drug use.  Limiting alcohol use.  Practicing safe sex.  Taking low-dose aspirin every day.  Taking vitamin and mineral supplements as recommended by your health care provider. What happens during an annual well check? The services and screenings done by your health care provider during your annual well check will depend on your age, overall health, lifestyle risk factors, and family history of disease. Counseling  Your health care provider may ask you questions about your:  Alcohol use.  Tobacco use.  Drug use.  Emotional well-being.  Home and relationship well-being.  Sexual activity.  Eating habits.  History of falls.  Memory and ability to understand (cognition).  Work and work Statistician.  Reproductive health. Screening  You may have the following tests or measurements:  Height, weight, and BMI.  Blood pressure.  Lipid and cholesterol levels. These may be checked every 5 years, or more frequently if you are over 72 years old.  Skin check.  Lung cancer screening. You may have this screening every year starting at age 72 if you have a 30-pack-year history of smoking and currently smoke or have quit within the past 15 years.  Fecal occult blood test (FOBT) of the stool. You may have this test every year starting at age 72.  Flexible sigmoidoscopy or colonoscopy. You may have a sigmoidoscopy every 5 years or a colonoscopy every 10 years starting at age 72.  Hepatitis C  blood test.  Hepatitis B blood test.  Sexually transmitted disease (STD) testing.  Diabetes screening. This  is done by checking your blood sugar (glucose) after you have not eaten for a while (fasting). You may have this done every 1-3 years.  Bone density scan. This is done to screen for osteoporosis. You may have this done starting at age 72.  Mammogram. This may be done every 1-2 years. Talk to your health care provider about how often you should have regular mammograms. Talk with your health care provider about your test results, treatment options, and if necessary, the need for more tests. Vaccines  Your health care provider may recommend certain vaccines, such as:  Influenza vaccine. This is recommended every year.  Tetanus, diphtheria, and acellular pertussis (Tdap, Td) vaccine. You may need a Td booster every 10 years.  Zoster vaccine. You may need this after age 35.  Pneumococcal 13-valent conjugate (PCV13) vaccine. One dose is recommended after age 72.  Pneumococcal polysaccharide (PPSV23) vaccine. One dose is recommended after age 72. Talk to your health care provider about which screenings and vaccines you need and how often you need them. This information is not intended to replace advice given to you by your health care provider. Make sure you discuss any questions you have with your health care provider. Document Released: 05/20/2015 Document Revised: 01/11/2016 Document Reviewed: 02/22/2015 Elsevier Interactive Patient Education  2017 [employer] Prevention in the Home Falls can cause injuries. They can happen to people of all ages. There are many things you can do to make your home safe and to help prevent falls. What can I do on the outside of my home?  Regularly fix the edges of walkways and driveways and fix any cracks.  Remove anything that might make you trip as you walk through a door, such as a raised step or threshold.  Trim any bushes or trees on the path to your home.  Use bright outdoor lighting.  Clear any walking paths of anything that might make  someone trip, such as rocks or tools.  Regularly check to see if handrails are loose or broken. Make sure that both sides of any steps have handrails.  Any raised decks and porches should have guardrails on the edges.  Have any leaves, snow, or ice cleared regularly.  Use sand or salt on walking paths during winter.  Clean up any spills in your garage right away. This includes oil or grease spills. What can I do in the bathroom?  Use night lights.  Install grab bars by the toilet and in the tub and shower. Do not use towel bars as grab bars.  Use non-skid mats or decals in the tub or shower.  If you need to sit down in the shower, use a plastic, non-slip stool.  Keep the floor dry. Clean up any water that spills on the floor as soon as it happens.  Remove soap buildup in the tub or shower regularly.  Attach bath mats securely with double-sided non-slip rug tape.  Do not have throw rugs and other things on the floor that can make you trip. What can I do in the bedroom?  Use night lights.  Make sure that you have a light by your bed that is easy to reach.  Do not use any sheets or blankets that are too big for your bed. They should not hang down onto the floor.  Have a firm chair that has side arms. You  can use this for support while you get dressed.  Do not have throw rugs and other things on the floor that can make you trip. What can I do in the kitchen?  Clean up any spills right away.  Avoid walking on wet floors.  Keep items that you use a lot in easy-to-reach places.  If you need to reach something above you, use a strong step stool that has a grab bar.  Keep electrical cords out of the way.  Do not use floor polish or wax that makes floors slippery. If you must use wax, use non-skid floor wax.  Do not have throw rugs and other things on the floor that can make you trip. What can I do with my stairs?  Do not leave any items on the stairs.  Make sure that  there are handrails on both sides of the stairs and use them. Fix handrails that are broken or loose. Make sure that handrails are as long as the stairways.  Check any carpeting to make sure that it is firmly attached to the stairs. Fix any carpet that is loose or worn.  Avoid having throw rugs at the top or bottom of the stairs. If you do have throw rugs, attach them to the floor with carpet tape.  Make sure that you have a light switch at the top of the stairs and the bottom of the stairs. If you do not have them, ask someone to add them for you. What else can I do to help prevent falls?  Wear shoes that:  Do not have high heels.  Have rubber bottoms.  Are comfortable and fit you well.  Are closed at the toe. Do not wear sandals.  If you use a stepladder:  Make sure that it is fully opened. Do not climb a closed stepladder.  Make sure that both sides of the stepladder are locked into place.  Ask someone to hold it for you, if possible.  Clearly mark and make sure that you can see:  Any grab bars or handrails.  First and last steps.  Where the edge of each step is.  Use tools that help you move around (mobility aids) if they are needed. These include:  Canes.  Walkers.  Scooters.  Crutches.  Turn on the lights when you go into a dark area. Replace any light bulbs as soon as they burn out.  Set up your furniture so you have a clear path. Avoid moving your furniture around.  If any of your floors are uneven, fix them.  If there are any pets around you, be aware of where they are.  Review your medicines with your doctor. Some medicines can make you feel dizzy. This can increase your chance of falling. Ask your doctor what other things that you can do to help prevent falls. This information is not intended to replace advice given to you by your health care provider. Make sure you discuss any questions you have with your health care provider. Document Released:  02/17/2009 Document Revised: 09/29/2015 Document Reviewed: 05/28/2014 Elsevier Interactive Patient Education  2017 [employer].

## 2020-02-15 NOTE — Assessment & Plan Note (Signed)
Chronic Check a1c Low sugar / carb diet Stressed regular exercise  

## 2020-02-15 NOTE — Assessment & Plan Note (Addendum)
,  Chronic Check lipid panel, CMP Continue  crestor 40 mg daily, continue Xarelto 10 mg daily-start by cardiology Regular exercise and healthy diet encouraged

## 2020-02-16 ENCOUNTER — Other Ambulatory Visit: Payer: Self-pay | Admitting: Obstetrics and Gynecology

## 2020-02-16 ENCOUNTER — Ambulatory Visit (INDEPENDENT_AMBULATORY_CARE_PROVIDER_SITE_OTHER): Payer: Medicare Other

## 2020-02-16 DIAGNOSIS — Z13820 Encounter for screening for osteoporosis: Secondary | ICD-10-CM

## 2020-02-16 DIAGNOSIS — Z780 Asymptomatic menopausal state: Secondary | ICD-10-CM

## 2020-02-16 DIAGNOSIS — N958 Other specified menopausal and perimenopausal disorders: Secondary | ICD-10-CM

## 2020-02-16 DIAGNOSIS — Z01419 Encounter for gynecological examination (general) (routine) without abnormal findings: Secondary | ICD-10-CM

## 2020-02-16 NOTE — Addendum Note (Signed)
Addended by: Marcina Millard on: 02/16/2020 01:23 PM   Modules accepted: Orders

## 2020-02-19 ENCOUNTER — Other Ambulatory Visit: Payer: Medicare Other

## 2020-02-19 ENCOUNTER — Other Ambulatory Visit: Payer: Self-pay

## 2020-02-19 DIAGNOSIS — E782 Mixed hyperlipidemia: Secondary | ICD-10-CM

## 2020-02-19 DIAGNOSIS — R7303 Prediabetes: Secondary | ICD-10-CM

## 2020-02-19 LAB — COMPREHENSIVE METABOLIC PANEL
ALT: 20 U/L (ref 0–35)
AST: 25 U/L (ref 0–37)
Albumin: 4.1 g/dL (ref 3.5–5.2)
Alkaline Phosphatase: 53 U/L (ref 39–117)
BUN: 10 mg/dL (ref 6–23)
CO2: 31 mEq/L (ref 19–32)
Calcium: 9.1 mg/dL (ref 8.4–10.5)
Chloride: 105 mEq/L (ref 96–112)
Creatinine, Ser: 0.67 mg/dL (ref 0.40–1.20)
GFR: 87.42 mL/min (ref >60.00)
Glucose, Bld: 97 mg/dL (ref 70–99)
Potassium: 4.3 mEq/L (ref 3.5–5.1)
Sodium: 141 mEq/L (ref 135–145)
Total Bilirubin: 1.3 mg/dL — ABNORMAL HIGH (ref 0.2–1.2)
Total Protein: 7 g/dL (ref 6.0–8.3)

## 2020-02-19 LAB — LIPID PANEL
Cholesterol: 163 mg/dL (ref 0–200)
HDL: 50.4 mg/dL (ref >39.00)
LDL Cholesterol: 90 mg/dL (ref 0–99)
NonHDL: 112.22
Total CHOL/HDL Ratio: 3
Triglycerides: 111 mg/dL (ref 0.0–149.0)
VLDL: 22.2 mg/dL (ref 0.0–40.0)

## 2020-02-19 LAB — HEMOGLOBIN A1C: Hgb A1c MFr Bld: 6.2 % (ref 4.6–6.5)

## 2020-03-02 ENCOUNTER — Encounter: Payer: Self-pay | Admitting: Internal Medicine

## 2020-03-08 NOTE — Progress Notes (Signed)
Subjective:    Patient ID: Lindsey Barrett, female    DOB: 01-30-1946, 74 y.o.   MRN: [id number]  HPI The patient is here for an acute visit.   Right knee pain -  She denies obvious injury.  It started 10/22 after returning home from camp .  She iced it and it improved.  It has gotten worse again recently.  She has decreased flexion - if feels swollen.  She has pain on the lateral side, under the knee cap and in the distal quadriceps.  She has not iced it recently and has not taken anything for it.  It only hurts when she moves.  Any time she has to flex it is when she feels the pain worse.    She denies N/ T. No calf pain.   Medications and allergies reviewed with patient and updated if appropriate.  Patient Active Problem List   Diagnosis Date Noted  . Acute pain of right knee 03/09/2020  . Sensorineural hearing loss (SNHL) of both ears 03/20/2019  . Deviated nasal septum 03/02/2019  . Pulmonary nodule 07/29/2018  . Dupuytren's contracture of both hands 07/11/2018  . Ganglion cyst of volar aspect of left wrist 06/04/2018  . 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 Epistaxis 02/05/2018  . Right rotator cuff tear 10/15/2017  . Tinnitus of both ears 01/01/2017  . Reaction to internal stress 05/29/2016  . Prediabetes 08/10/2015  . 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[place]) 05/20/2015  . Open-angle glaucoma 06/17/2014  . Vitamin D deficiency 10/18/2011  . HSV infection   . Atrophic vaginitis   . VARICOSE VEINS, LOWER EXTREMITIES 09/28/2009  . GERD 07/14/2009  . Hyperlipidemia 12/26/2006  . GILBERT'S SYNDROME 08/09/2006  . DEGENERATIVE DISC DISEASE 08/09/2006    Current Outpatient Medications on File Prior to Visit  Medication Sig Dispense Refill  . aspirin EC 81 MG tablet Take 1 tablet (81 mg total) by mouth daily.    . bimatoprost (LUMIGAN) 0.01 % SOLN Place 1 drop into both eyes at bedtime.    . brinzolamide (AZOPT) 1 % ophthalmic suspension 1 drop 2  (two) times daily.    . calcium carbonate (TUMS - DOSED IN MG ELEMENTAL CALCIUM) 500 MG chewable tablet Chew 1 tablet by mouth as needed for indigestion or heartburn (1000 mg).     . Carboxymethylcellulose Sodium (REFRESH LIQUIGEL OP) Apply 1 drop to eye 4 (four) times daily as needed.    . Cholecalciferol (VITAMIN D3 PO) Take 5,000 Int'l Units by mouth daily.     [name] ezetimibe (ZETIA) 10 MG tablet Take 1 tablet by mouth once daily 30 tablet 11  . famotidine (PEPCID) 10 MG tablet Take 1 tablet (10 mg total) by mouth 2 (two) times daily. (Patient taking differently: Take 10 mg by mouth daily. )    . letrozole (FEMARA) 2.5 MG tablet Take 1 tablet by mouth once daily 90 tablet 3  . rosuvastatin (CRESTOR) 40 MG tablet Take 1 tablet (40 mg total) by mouth daily. 90 tablet 3  . sodium chloride (MURO 128) 5 % ophthalmic solution Place 1 drop into both eyes 2 (two) times a day. Before Azopt    . [DISCONTINUED] metoprolol tartrate (LOPRESSOR) 25 MG tablet Take 1 tablet (25 mg total) by mouth as directed. 1 pill every 8 hours as needed for tachycardia or palpitations. 30 tablet 2   No current facility-administered medications on file prior to visit.    Past Medical History:  Diagnosis  Date  . Anxiety   . Atrophic vaginitis   . Breast cancer ([place])   . 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[place]) 05/20/2015  . Breast cyst    x3  . DDD (degenerative disc disease)   . Diverticulosis   . Fasting hyperglycemia   . GERD (gastroesophageal reflux disease)   . Gilbert syndrome   . Glaucoma   . History of hiatal hernia   . HSV infection   . Hyperlipidemia   . Radiation    left breast 50.4 gray  . STD (sexually transmitted disease)    HSV    Past Surgical History:  Procedure Laterality Date  . APPENDECTOMY  1961  . BREAST CYST ASPIRATION     x2 right ,x1 left  . COLONOSCOPY  2007   neg  . DILATION AND CURETTAGE OF UTERUS  1975  . FLEXIBLE SIGMOIDOSCOPY     x2  . PELVIC  LAPAROSCOPY    . RADIOACTIVE SEED GUIDED PARTIAL MASTECTOMY WITH AXILLARY SENTINEL LYMPH NODE BIOPSY Left 05/31/2015   Procedure: LEFT BREAST RADIOACTIVE SEED GUIDED LUMPECTOMY WITH LEFT AXILLARY SENTINEL LYMPH NODE BIOPSY;  Surgeon: [name], MD;  Location: [place];  Service: General;  Laterality: Left;  . RE-EXCISION OF BREAST LUMPECTOMY Left 07/01/2015   Procedure: RE-EXCISION OF LEFT BREAST INFERIOR MARGIN;  Surgeon: [name], MD;  Location: [place];  Service: General;  Laterality: Left;  . UPPER GASTROINTESTINAL ENDOSCOPY    . UPPER GI ENDOSCOPY  10/2010   Dr [name]  . WISDOM TOOTH EXTRACTION  1978    Social History   Socioeconomic History  . Marital status: Divorced    Spouse name: Not on file  . Number of children: 0  . Years of education: Not on file  . Highest education level: Not on file  Occupational History  . 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 Smoking status: Former Smoker    Types: Cigarettes    Quit date: 03/28/1999    Years since quitting: 20.9  . Smokeless tobacco: Never Used  . Tobacco comment: smoked 1967-2000, up to 3/4 ppd  Vaping Use  . Vaping Use: Never used  Substance and Sexual Activity  . Alcohol use: Yes    Alcohol/week: 10.0 standard drinks    Types: 10 Cans of beer per week  . Drug use: No  . Sexual activity: Not Currently    Birth control/protection: Post-menopausal    Comment: 1st intercourse 74 yo-More than 5 partners  Other Topics Concern  . 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      Regular exercise-no   Social Determinants of Health   Financial Resource Strain: Low Risk   . Difficulty of Paying Living Expenses: Not hard at all  Food Insecurity: No Food Insecurity  . Worried About Charity fundraiser in the Last Year: Never true  . Ran Out of Food in the Last Year: Never true    Transportation Needs: No Transportation Needs  . Lack of Transportation (Medical): No  . Lack of Transportation (Non-Medical): No  Physical Activity: Sufficiently Active  . Days of Exercise per Week: 5 days  . Minutes of Exercise per Session: 30 min  Stress: No Stress Concern Present  . Feeling of Stress : Not at all  Social Connections: Moderately Integrated  . Frequency of Communication with Friends and  Family: More than three times a week  . Frequency of Social Gatherings with Friends and Family: More than three times a week  . Attends Religious Services: More than 4 times per year  . Active Member of Clubs or Organizations: Yes  . Attends Archivist Meetings: More than 4 times per year  . Marital Status: Divorced    Family History  Problem Relation Age of Onset  . Lung cancer Mother        smoker  . Endometriosis Mother   . Cancer Mother        Bladder cancer  . Stomach cancer Maternal Grandmother   . Coronary artery disease Father   . Cancer Father        intestinal carcinoid tumors  . Sudden death Sister        71 week old, congenital deformity  . Liver cancer Maternal Uncle   . Stroke Paternal Grandfather 53  . Leukemia Maternal Uncle   . Breast cancer Paternal Aunt        great paternal aunt-Age 39's  . Colon cancer Paternal Aunt        lived to 51; pat great aunt  . Vascular Disease Paternal Grandmother        thoracic aneurysm  . Esophageal cancer Neg Hx   . Rectal cancer Neg Hx   . Pancreatic cancer Neg Hx     Review of Systems Per HPI    Objective:   Vitals:   03/09/20 1351  BP: 120/72  Pulse: 77  Temp: 98.2 F (36.8 C)  SpO2: 97%   BP Readings from Last 3 Encounters:  03/09/20 120/72  02/15/20 120/70  02/15/20 120/70   Wt Readings from Last 3 Encounters:  03/09/20 166 lb (75.3 kg)  02/15/20 164 lb (74.4 kg)  02/15/20 164 lb 6.4 oz (74.6 kg)   Body mass index is 28.49 kg/m.   Physical Exam    A right knee exam was  performed.   SKIN: intact, no erythema, minimal bruise superior knee SWELLING: mild  EFFUSION: yes  WARMTH: mild warmth  TENDERNESS: minimal tenderness on patellar tibial tendon and quad tendon ROM: full extension, slightly decreased flexion  GAIT:normal   NEUROLOGICAL EXAM: normal sensation  CALF TENDERNESS: no        Assessment & Plan:    See Problem List for Assessment and Plan of chronic medical problems.    This visit occurred during the SARS-CoV-2 public health emergency.  Safety protocols were in place, including screening questions prior to the visit, additional usage of staff PPE, and extensive cleaning of exam room while observing appropriate contact time as indicated for disinfecting solutions.

## 2020-03-09 ENCOUNTER — Ambulatory Visit (INDEPENDENT_AMBULATORY_CARE_PROVIDER_SITE_OTHER): Payer: Medicare Other | Admitting: Internal Medicine

## 2020-03-09 ENCOUNTER — Other Ambulatory Visit: Payer: Self-pay

## 2020-03-09 ENCOUNTER — Encounter: Payer: Self-pay | Admitting: Internal Medicine

## 2020-03-09 DIAGNOSIS — I2510 Atherosclerotic heart disease of native coronary artery without angina pectoris: Secondary | ICD-10-CM | POA: Diagnosis not present

## 2020-03-09 DIAGNOSIS — M25561 Pain in right knee: Secondary | ICD-10-CM | POA: Insufficient documentation

## 2020-03-09 MED ORDER — MELOXICAM 15 MG PO TABS: 15.0000 mg | ORAL_TABLET | Freq: Every day | ORAL | 0 refills | Status: DC

## 2020-03-09 NOTE — Assessment & Plan Note (Addendum)
Acute pain and effusion - possible flare of OA Advised revision of activities, ice knee, meloxicam 15 mg daily with food x 2 weeks Referred to Dr Tamala Julian

## 2020-03-09 NOTE — Patient Instructions (Addendum)
  Medications reviewed and updated.  Changes include :   meloxicam 15 mg daily with food for two weeks.  Ice the knee 2-3 times a day.   Revise your activities.  Elevate when sitting.    Your prescription(s) have been submitted to your pharmacy. Please take as directed and contact our office if you believe you are having problem(s) with the medication(s).  A referral was ordered for sports medicine.       Someone from their office will call you to schedule an appointment.

## 2020-03-19 ENCOUNTER — Ambulatory Visit: Payer: Medicare Other | Attending: Internal Medicine

## 2020-03-19 DIAGNOSIS — Z23 Encounter for immunization: Secondary | ICD-10-CM

## 2020-03-19 NOTE — Progress Notes (Signed)
   Covid-19 Vaccination Clinic  Name:  Lindsey Barrett    MRN: [id number] DOB: [birth year]  03/19/2020  [name] was observed post Covid-19 immunization for 15 minutes without incident. She was provided with Vaccine Information Sheet and instruction to access the V-Safe system.   [name] was instructed to call 911 with any severe reactions post vaccine: [name] Difficulty breathing  . Swelling of face and throat  . A fast heartbeat  . A bad rash all over body  . Dizziness and weakness   Immunizations Administered    Name Date Dose VIS Date Route   Pfizer COVID-19 Vaccine 03/19/2020  1:10 PM 0.3 mL 02/24/2020 Intramuscular   Manufacturer: [place]   Lot: [id number]   [place]: [id number]

## 2020-03-22 ENCOUNTER — Encounter: Payer: Self-pay | Admitting: Gastroenterology

## 2020-03-22 ENCOUNTER — Ambulatory Visit (INDEPENDENT_AMBULATORY_CARE_PROVIDER_SITE_OTHER): Payer: Medicare Other | Admitting: Gastroenterology

## 2020-03-22 VITALS — BP 124/70 | HR 84 | Ht 64.5 in | Wt 164.0 lb

## 2020-03-22 DIAGNOSIS — K219 Gastro-esophageal reflux disease without esophagitis: Secondary | ICD-10-CM | POA: Diagnosis not present

## 2020-03-22 DIAGNOSIS — I2510 Atherosclerotic heart disease of native coronary artery without angina pectoris: Secondary | ICD-10-CM

## 2020-03-22 DIAGNOSIS — R194 Change in bowel habit: Secondary | ICD-10-CM

## 2020-03-22 DIAGNOSIS — Z860100 Personal history of colon polyps, unspecified: Secondary | ICD-10-CM

## 2020-03-22 MED ORDER — FAMOTIDINE 10 MG PO TABS
10.0000 mg | ORAL_TABLET | Freq: Every day | ORAL | 3 refills | Status: DC
Start: 2020-03-22 — End: 2022-05-10

## 2020-03-22 NOTE — Progress Notes (Signed)
HPI :  74 year old female, history of GERD, Gilbert's syndrome, remote history of breast cancer, here for a follow up visit in regards to history of postprandial complaints, bowel habits.  She was most recently seen by Nicoletta Ba in June of this year for recurrent episodes of postprandial presyncope that were happening about once per month.  She states in fact these issues have been ongoing since July 2010, had occasional postprandial symptoms when she overeats or eats quickly.  She would feel sense of lightheadedness and presyncope, denied palpitations with it.  These episodes were occurring a bit more frequently when she saw [place] in June.  She was referred to cardiology for further evaluation. She had an echocardiogram which showed a normal ejection fraction with mild LVH, normal diastolic function.  She then wore a cardiac monitor for 13 days which showed a sinus rhythm with no arrhythmia.  She denied any episodes of symptoms while she was wearing this monitor.  She actually has not had an episode since May.  She has been following instructions to eat smaller/lighter meals in the evenings and avoid overeating which she thinks is definitely helped.  She has been on Pepcid 10 mg every morning and using that routinely.  She does have a history of reflux, states she has been intolerant to PPIs over the years.  Had been using Zantac previously and then switched to Pepcid.  She does get occasional regurgitation at night depending on what she eats before bed but generally the Pepcid controlling the reflux.  She has had a prior EGD which showed no evidence of Barrett's esophagus.  Her last colonoscopy was in 2017 and she had one small adenoma removed at that time.  She states her bowels are generally regular, has 1-2 bowel movements per day however its " not as easy to go as previous".  She denies overt constipation, denies sense of incomplete evacuation.  Denies blood in the stools.  Is otherwise been  worked up for pulmonary nodule in recent year.  She had a PET scan which showed a pulmonary nodule, has been followed with CT scan and referred to pulmonary this is been stable over time.  Plans for continued monitoring, no evidence of recurrent breast cancer.   Colonoscopy 02/13/16 - The perianal and digital rectal examinations were normal. - A 5 mm polyp was found in the hepatic flexure. The polyp was sessile. The polyp was removed with a cold snare. Resection and retrieval were complete. - Scattered medium-mouthed diverticula were found in the transverse colon and left colon. - Anal papilla(e) were hypertrophied. - The exam was otherwise without abnormality.  Surgical [P], hepatic flexure, polyp - TUBULAR ADENOMA. - NO HIGH GRADE DYSPLASIA OR MALIGNANCY IDENTIFIED.   CT chest 10/27/2019 - IMPRESSION: 1. Stable 8 mm nodule at the RIGHT lung apex with well-circumscribed margins. Stability argues for benign etiology or indolent process. To further document stability 6-12 month follow-up is suggested. 2. Postoperative changes in the LEFT breast and LEFT axilla similar to the prior study. 3. No acute cardiopulmonary process. 4. Coronary artery and aortic atherosclerosis.   EGD 10/25/2010 - irregular z line - Diagnosis 1. Surgical [P], gastric, bx BENIGN GASTRIC MUCOSA. NO HELICOBACTER PYLORI, INTESTINAL METAPLASIA OR ACTIVE INFLAMMATION IDENTIFIED. 2. Surgical [P], EG junction, bx FINDINGS CONSISTENT WITH GASTROESOPHAGEAL REFLUX. NO INTESTINAL METAPLASIA, DYSPLASIA OR MALIGNANCY IDENTIFIED.     Past Medical History:  Diagnosis Date  . Anxiety   . Atrophic vaginitis   . Breast cancer ([place])   .  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[place]) 05/20/2015  . Breast cyst    x3  . DDD (degenerative disc disease)   . Diverticulosis   . Fasting hyperglycemia   . GERD (gastroesophageal reflux disease)   . Gilbert syndrome   . Glaucoma   . History of hiatal hernia     . HSV infection   . Hyperlipidemia   . Radiation    left breast 50.4 gray  . STD (sexually transmitted disease)    HSV     Past Surgical History:  Procedure Laterality Date  . APPENDECTOMY  1961  . BREAST CYST ASPIRATION     x2 right ,x1 left  . COLONOSCOPY  2007   neg  . DILATION AND CURETTAGE OF UTERUS  1975  . FLEXIBLE SIGMOIDOSCOPY     x2  . PELVIC LAPAROSCOPY    . RADIOACTIVE SEED GUIDED PARTIAL MASTECTOMY WITH AXILLARY SENTINEL LYMPH NODE BIOPSY Left 05/31/2015   Procedure: LEFT BREAST RADIOACTIVE SEED GUIDED LUMPECTOMY WITH LEFT AXILLARY SENTINEL LYMPH NODE BIOPSY;  Surgeon: [name], MD;  Location: [facility];  Service: General;  Laterality: Left;  . RE-EXCISION OF BREAST LUMPECTOMY Left 07/01/2015   Procedure: RE-EXCISION OF LEFT BREAST INFERIOR MARGIN;  Surgeon: [name], MD;  Location: [facility];  Service: General;  Laterality: Left;  . UPPER GASTROINTESTINAL ENDOSCOPY    . UPPER GI ENDOSCOPY  10/2010   Dr [name]  . WISDOM TOOTH EXTRACTION  1978   Family History  Problem Relation Age of Onset  . Lung cancer Mother        smoker  . Endometriosis Mother   . Cancer Mother        Bladder cancer  . Stomach cancer Maternal Grandmother   . Coronary artery disease Father   . Cancer Father        intestinal carcinoid tumors  . Sudden death Sister        20 week old, congenital deformity  . Liver cancer Maternal Uncle   . Stroke Paternal Grandfather 32  . Leukemia Maternal Uncle   . Breast cancer Paternal Aunt        great paternal aunt-Age 54's  . Colon cancer Paternal Aunt        lived to 62; pat great aunt  . Vascular Disease Paternal Grandmother        thoracic aneurysm  . Esophageal cancer Neg Hx   . Rectal cancer Neg Hx   . Pancreatic cancer Neg Hx    Social History   Tobacco Use  . Smoking status: Former Smoker    Types: Cigarettes    Quit date: 03/28/1999    Years since quitting: 21.0  . Smokeless  tobacco: Never Used  . Tobacco comment: smoked 1967-2000, up to 3/4 ppd  Vaping Use  . Vaping Use: Never used  Substance Use Topics  . Alcohol use: Yes    Alcohol/week: 10.0 standard drinks    Types: 10 Cans of beer per week  . Drug use: No   Current Outpatient Medications  Medication Sig Dispense Refill  . aspirin EC 81 MG tablet Take 1 tablet (81 mg total) by mouth daily.    . bimatoprost (LUMIGAN) 0.01 % SOLN Place 1 drop into both eyes at bedtime.    . brinzolamide (AZOPT) 1 % ophthalmic suspension 1 drop 2 (two) times daily.    . calcium carbonate (TUMS - DOSED IN MG ELEMENTAL CALCIUM) 500 MG chewable tablet Chew  1 tablet by mouth as needed for indigestion or heartburn (1000 mg).     . Carboxymethylcellulose Sodium (REFRESH LIQUIGEL OP) Apply 1 drop to eye 4 (four) times daily as needed.    . Cholecalciferol (VITAMIN D3 PO) Take 5,000 Int'l Units by mouth daily.     [name] ezetimibe (ZETIA) 10 MG tablet Take 1 tablet by mouth once daily 30 tablet 11  . famotidine (PEPCID) 10 MG tablet Take 1 tablet (10 mg total) by mouth 2 (two) times daily. (Patient taking differently: Take 10 mg by mouth daily. )    . letrozole (FEMARA) 2.5 MG tablet Take 1 tablet by mouth once daily 90 tablet 3  . meloxicam (MOBIC) 15 MG tablet Take 1 tablet (15 mg total) by mouth daily. 14 tablet 0  . rosuvastatin (CRESTOR) 40 MG tablet Take 1 tablet (40 mg total) by mouth daily. 90 tablet 3  . sodium chloride (MURO 128) 5 % ophthalmic solution Place 1 drop into both eyes 2 (two) times a day. Before Azopt     No current facility-administered medications for this visit.   Allergies  Allergen Reactions  . Arimidex [Anastrozole] Other (See Comments)    Unable to function, loopy, dizzy, and mentally fogginess  . Septra Ds [Sulfamethoxazole-Trimethoprim] Hives  . Sulfa Antibiotics   . Adhesive [Tape] Rash  . Other Rash     Review of Systems: All systems reviewed and negative except where noted in HPI.   Lab  Results  Component Value Date   WBC 8.0 11/04/2019   HGB 14.0 11/04/2019   HCT 43.8 11/04/2019   MCV 90 11/04/2019   PLT 246 11/04/2019    Lab Results  Component Value Date   CREATININE 0.67 02/19/2020   BUN 10 02/19/2020   NA 141 02/19/2020   K 4.3 02/19/2020   CL 105 02/19/2020   CO2 31 02/19/2020    Lab Results  Component Value Date   ALT 20 02/19/2020   AST 25 02/19/2020   ALKPHOS 53 02/19/2020   BILITOT 1.3 (H) 02/19/2020   No results found for: INR, PROTIME   Physical Exam: BP 124/70   Pulse 84   Ht 5' 4.5" (1.638 m)   Wt 164 lb (74.4 kg)   BMI 27.72 kg/m  Constitutional: Pleasant,well-developed, female in no acute distress. Neurological: Alert and oriented to person place and time. Psychiatric: Normal mood and affect. Behavior is normal.   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  She had a negative cardiac work-up with echo and cardiac monitor however she had no episodes at the time of her cardiac monitoring.  She has avoided overeating and eating small meals and this has not recurred.  Recommend she continue to follow those precautions.  If she has another episode I would recommend that she check her pulse, assess for bradycardia etc.  Given the infrequency of these episodes at this time it will be very difficult to get this captured on a cardiac monitor.  If she has more frequent symptoms that recur moving forward then would consider longer duration cardiac monitor.  So far doing much better over the past 6 months, hopefully this does not recur much for her, continue dietary modifications.  GERD - pretty well controlled with low-dose Pepcid, she does not tolerate PPIs, no history of Barrett's,  continue Pepcid as needed.  Altered bowel habits - recommend trial  Citrucel once daily, follow-up as needed if symptoms persist.  History of colon polyps - one small adenoma noted in 2017, will change her recall to 2024 given updated guidelines for surveillance of small adenomas.  I spent 35 minutes of time, including in depth chart review, independent review of results as outlined above, communicating results with the patient directly, face-to-face time with the patient, and documenting this encounter.  [place] Cellar, MD [facility]

## 2020-03-22 NOTE — Patient Instructions (Addendum)
If you are age 72 or older, your body mass index should be between 23-30. Your Body mass index is 27.72 kg/m. If this is out of the aforementioned range listed, please consider follow up with your Primary Care Provider.  If you are age 35 or younger, your body mass index should be between 19-25. Your Body mass index is 27.72 kg/m. If this is out of the aformentioned range listed, please consider follow up with your Primary Care Provider.   Please take Citrucel fiber supplement daily.  We have sent the following medications to your pharmacy for you to pick up at your convenience: Pepcid 10 mg: Take once daily  You will be due for a recall colonoscopy in 02-2023. We will send you a reminder in the mail when it gets closer to that time.  Follow up as needed.  Thank you for entrusting me with your care and for choosing [facility], Dr. [place] Cellar

## 2020-04-18 ENCOUNTER — Ambulatory Visit (INDEPENDENT_AMBULATORY_CARE_PROVIDER_SITE_OTHER): Payer: Medicare Other

## 2020-04-18 ENCOUNTER — Ambulatory Visit: Payer: Self-pay

## 2020-04-18 ENCOUNTER — Other Ambulatory Visit: Payer: Self-pay

## 2020-04-18 ENCOUNTER — Encounter: Payer: Self-pay | Admitting: Family Medicine

## 2020-04-18 ENCOUNTER — Ambulatory Visit (INDEPENDENT_AMBULATORY_CARE_PROVIDER_SITE_OTHER): Payer: Medicare Other | Admitting: Family Medicine

## 2020-04-18 VITALS — BP 140/68 | HR 96 | Ht 64.5 in | Wt 167.0 lb

## 2020-04-18 DIAGNOSIS — Z170 Estrogen receptor positive status [ER+]: Secondary | ICD-10-CM | POA: Diagnosis not present

## 2020-04-18 DIAGNOSIS — M25561 Pain in right knee: Secondary | ICD-10-CM

## 2020-04-18 DIAGNOSIS — M79605 Pain in left leg: Secondary | ICD-10-CM | POA: Diagnosis not present

## 2020-04-18 DIAGNOSIS — I2510 Atherosclerotic heart disease of native coronary artery without angina pectoris: Secondary | ICD-10-CM

## 2020-04-18 DIAGNOSIS — M79662 Pain in left lower leg: Secondary | ICD-10-CM

## 2020-04-18 DIAGNOSIS — C50212 Malignant neoplasm of upper-inner quadrant of left female breast: Secondary | ICD-10-CM

## 2020-04-18 NOTE — Progress Notes (Signed)
[place] Phone: [phone] Subjective:   Lindsey Barrett, am serving as a scribe for Dr. Hulan Saas. This visit occurred during the SARS-CoV-2 public health emergency.  Safety protocols were in place, including screening questions prior to the visit, additional usage of staff PPE, and extensive cleaning of exam room while observing appropriate contact time as indicated for disinfecting solutions.    I'm seeing this patient by the request  of:  [name], MD  CC: right knee pain   [id number]  Lindsey Barrett is a 74 y.o. female coming in with complaint of right knee pain. Pt reports knee pain has been ongoing since Oct. Pt was last seen in August 2020 for shoulder pain. Pt locates pain to superior aspect of knee. Possibly injured knee when she was [place]. Patient was at an adult camp but did not do anything physically active to cause the pain. Patient was unable to bend knee due to swelling in back of knee. Symptoms get better and then get worse. Saw Dr. Quay Burow. Was put on meloxicam and was told to ice knee. Did not take Meloxicam after reading drug side effect info sheet. Pain has improved but seems to reached a plateau.   Also complaining of left lower leg pain. States that she is using medications post-cancer and wants to make sure she is not developing a clot. Pain and warmth over lateral aspect of right ankle that is intermittent and occurring for past 3 months.        Past Medical History:  Diagnosis Date  . Anxiety   . Atrophic vaginitis   . Breast cancer ([place])   . 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[place]) 05/20/2015  . Breast cyst    x3  . DDD (degenerative disc disease)   . Diverticulosis   . Fasting hyperglycemia   . GERD (gastroesophageal reflux disease)   . Gilbert syndrome   . Glaucoma   . History of hiatal hernia   . HSV  infection   . Hyperlipidemia   . Radiation    left breast 50.4 gray  . STD (sexually transmitted disease)    HSV   Past Surgical History:  Procedure Laterality Date  . APPENDECTOMY  1961  . BREAST CYST ASPIRATION     x2 right ,x1 left  . COLONOSCOPY  2007   neg  . DILATION AND CURETTAGE OF UTERUS  1975  . FLEXIBLE SIGMOIDOSCOPY     x2  . PELVIC LAPAROSCOPY    . RADIOACTIVE SEED GUIDED PARTIAL MASTECTOMY WITH AXILLARY SENTINEL LYMPH NODE BIOPSY Left 05/31/2015   Procedure: LEFT BREAST RADIOACTIVE SEED GUIDED LUMPECTOMY WITH LEFT AXILLARY SENTINEL LYMPH NODE BIOPSY;  Surgeon: [name], MD;  Location: [facility];  Service: General;  Laterality: Left;  . RE-EXCISION OF BREAST LUMPECTOMY Left 07/01/2015   Procedure: RE-EXCISION OF LEFT BREAST INFERIOR MARGIN;  Surgeon: [name], MD;  Location: [facility];  Service: General;  Laterality: Left;  . UPPER GASTROINTESTINAL ENDOSCOPY    . UPPER GI ENDOSCOPY  10/2010   Dr [name]  . WISDOM TOOTH EXTRACTION  1978   Social History   Socioeconomic History  . Marital status: Divorced    Spouse name: Not on file  . Number of children: 0  . Years of education: Not on file  . Highest education level: Not on file  Occupational  History  . 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 Smoking status: Former Smoker    Types: Cigarettes    Quit date: 03/28/1999    Years since quitting: 21.0  . Smokeless tobacco: Never Used  . Tobacco comment: smoked 1967-2000, up to 3/4 ppd  Vaping Use  . Vaping Use: Never used  Substance and Sexual Activity  . Alcohol use: Yes    Alcohol/week: 10.0 standard drinks    Types: 10 Cans of beer per week  . Drug use: Barrett  . Sexual activity: Not Currently    Birth control/protection: Post-menopausal    Comment: 1st intercourse 74 yo-More than 5 partners  Other Topics Concern  . 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      Regular exercise-Barrett   Social Determinants of Health   Financial Resource Strain: Low Risk   . Difficulty of Paying Living Expenses: Not hard at all  Food Insecurity: Barrett Food Insecurity  . Worried About Charity fundraiser in the Last Year: Never true  . Ran Out of Food in the Last Year: Never true  Transportation Needs: Barrett Transportation Needs  . Lack of Transportation (Medical): Barrett  . Lack of Transportation (Non-Medical): Barrett  Physical Activity: Sufficiently Active  . Days of Exercise per Week: 5 days  . Minutes of Exercise per Session: 30 min  Stress: Barrett Stress Concern Present  . Feeling of Stress : Not at all  Social Connections: Moderately Integrated  . Frequency of Communication with Friends and Family: More than three times a week  . Frequency of Social Gatherings with Friends and Family: More than three times a week  . Attends Religious Services: More than 4 times per year  . Active Member of Clubs or Organizations: Yes  . Attends Archivist Meetings: More than 4 times per year  . Marital Status: Divorced   Allergies  Allergen Reactions  . Arimidex [Anastrozole] Other (See Comments)    Unable to function, loopy, dizzy, and mentally fogginess  . Septra Ds [Sulfamethoxazole-Trimethoprim] Hives  . Sulfa Antibiotics   . Adhesive [Tape] Rash  . Other Rash   Family History  Problem Relation Age of Onset  . Lung cancer Mother        smoker  . Endometriosis Mother   . Cancer Mother        Bladder cancer  . Stomach cancer Maternal Grandmother   . Coronary artery disease Father   . Cancer Father 62       intestinal carcinoid tumors of the ileum  . Sudden death Sister        9 week old, congenital deformity  . Liver cancer Maternal Uncle   . Stroke Paternal Grandfather 55  . Leukemia Maternal Uncle   . Breast cancer Paternal Aunt        great paternal aunt-Age 34's  . Colon cancer Paternal Aunt        lived to 4;  pat great aunt  . Vascular Disease Paternal Grandmother        thoracic aneurysm  . Esophageal cancer Neg Hx   . Rectal cancer Neg Hx   . Pancreatic cancer Neg Hx      Current Outpatient Medications (Cardiovascular):  .  ezetimibe (ZETIA) 10 MG tablet, Take 1 tablet by mouth once daily .  rosuvastatin (CRESTOR) 40 MG tablet, Take 1 tablet (40  mg total) by mouth daily.   Current Outpatient Medications (Analgesics):  .  aspirin EC 81 MG tablet, Take 1 tablet (81 mg total) by mouth daily. .  meloxicam (MOBIC) 15 MG tablet, Take 1 tablet (15 mg total) by mouth daily.   Current Outpatient Medications (Other):  .  bimatoprost (LUMIGAN) 0.01 % SOLN, Place 1 drop into both eyes at bedtime. .  brinzolamide (AZOPT) 1 % ophthalmic suspension, 1 drop 2 (two) times daily. .  calcium carbonate (TUMS - DOSED IN MG ELEMENTAL CALCIUM) 500 MG chewable tablet, Chew 1 tablet by mouth as needed for indigestion or heartburn (1000 mg).  .  Carboxymethylcellulose Sodium (REFRESH LIQUIGEL OP), Apply 1 drop to eye 4 (four) times daily as needed. .  Cholecalciferol (VITAMIN D3 PO), Take 5,000 Int'l Units by mouth daily.  .  famotidine (PEPCID) 10 MG tablet, Take 1 tablet (10 mg total) by mouth daily. [name]  letrozole (FEMARA) 2.5 MG tablet, Take 1 tablet by mouth once daily .  sodium chloride (MURO 128) 5 % ophthalmic solution, Place 1 drop into both eyes 2 (two) times a day. Before Azopt   Reviewed prior external information including notes and imaging from  primary care provider As well as notes that were available from care everywhere and other healthcare systems.  Past medical history, social, surgical and family history all reviewed in electronic medical record.  Barrett pertanent information unless stated regarding to the chief complaint.   Review of Systems:  Barrett headache, visual changes, nausea, vomiting, diarrhea, constipation, dizziness, abdominal pain, skin rash, fevers, chills, night sweats, weight loss,  swollen lymph nodes,, joint swelling, chest pain, shortness of breath, mood changes. POSITIVE muscle aches, body aches  Objective  Blood pressure 140/68, pulse 96, height 5' 4.5" (1.638 m), weight 167 lb (75.8 kg), SpO2 95 %.   General: Barrett apparent distress alert and oriented x3 mood and affect normal, dressed appropriately.  HEENT: Pupils equal, extraocular movements intact  Respiratory: Patient's speak in full sentences and does not appear short of breath  Cardiovascular: Barrett lower extremity edema, non tender, Barrett erythema  Right knee exam shows the patient does have some lateral tracking of the patella.  Mild valgus deformity of the knee noted.  Patient has made very mild instability with valgus stress.  Patient does have crepitus noted with flexion extension but does have full movement Left leg shows some mild hypertrophy noted in the anterior tibialis musculature.  Patient has Barrett mass appreciated.  Mildly tender to palpation of the distal third of the ankle laterally.  Patient though does have some mild pain in the calf on the left side.  Barrett erythema Barrett real significant swelling compared to the contralateral side patient does have some varicose veins bilaterally.  Limited musculoskeletal ultrasound was performed and interpreted by Lyndal Pulley  Limited ultrasound of patient's right knee shows that there is narrowing of the patellofemoral joint noted.  Patient does have degenerative changes of the meniscus medially and laterally but seems to be more chronic.  Barrett significant Baker's cyst noted. Impression: Patellofemoral arthritis    Impression and Recommendations:     The above documentation has been reviewed and is accurate and complete Lyndal Pulley, DO

## 2020-04-18 NOTE — Assessment & Plan Note (Signed)
Patient does have signs and symptoms consistent with potential arthritic changes and x-rays are pending.  We discussed with patient about home exercises and patient work with Product/process development scientist.  We discussed potential bracing that could be necessary and topical anti-inflammatories.  Patient would like to try conservative therapy first.  Ultrasound was fairly unremarkable except for some mild narrowing of the patellofemoral joint.  Due to the left leg pain we will get a lower extremity venous Doppler to rule out DVT with history of breast cancer.  Patient's pain is more over the anterior tibialis and I think it is secondary to how patient is responding to her knee pain.  Patient will follow up with me again in 4 to 6 weeks.

## 2020-04-18 NOTE — Assessment & Plan Note (Signed)
Patient is having some left leg pain.  Fullness noted more in the anterior tibialis tendon and minorly of the lateral gastroc area.  I believe it is more secondary to patient compensating for the knee.  No true masses appreciated.  Does have some pain in the gastroc.  History of the breast cancer and is on Femara.  Will have Doppler and further evaluate.  Follow-up again after Doppler in conservative therapy for the knee.  Worsening knee pain will need to consider injections and formal physical therapy

## 2020-04-18 NOTE — Patient Instructions (Addendum)
[facility] 04/19/2020, 12:45pm check-in, 1:00pm appt [facility] [address] [place], [place] [address] [phone]  Compression socks Knee xray today Exercises Volatren gel Tart Cherry 1200mg  at night OOFOS or HOKA recovery  See me again in 6 weeks

## 2020-04-19 ENCOUNTER — Other Ambulatory Visit: Payer: Self-pay

## 2020-04-19 ENCOUNTER — Telehealth: Payer: Self-pay | Admitting: Family Medicine

## 2020-04-19 ENCOUNTER — Ambulatory Visit (INDEPENDENT_AMBULATORY_CARE_PROVIDER_SITE_OTHER): Payer: Medicare Other | Admitting: Family Medicine

## 2020-04-19 ENCOUNTER — Ambulatory Visit: Payer: Self-pay

## 2020-04-19 ENCOUNTER — Ambulatory Visit (INDEPENDENT_AMBULATORY_CARE_PROVIDER_SITE_OTHER): Payer: Medicare Other

## 2020-04-19 ENCOUNTER — Ambulatory Visit (HOSPITAL_COMMUNITY)
Admission: RE | Admit: 2020-04-19 | Discharge: 2020-04-19 | Disposition: A | Discharge status: Home / Self Care | Payer: Medicare Other | Source: Ambulatory Visit | Attending: Cardiology | Admitting: Cardiology

## 2020-04-19 ENCOUNTER — Encounter: Payer: Self-pay | Admitting: Family Medicine

## 2020-04-19 VITALS — BP 144/84 | HR 74 | Ht 64.5 in | Wt 167.0 lb

## 2020-04-19 DIAGNOSIS — M25532 Pain in left wrist: Secondary | ICD-10-CM

## 2020-04-19 DIAGNOSIS — I2510 Atherosclerotic heart disease of native coronary artery without angina pectoris: Secondary | ICD-10-CM | POA: Diagnosis not present

## 2020-04-19 DIAGNOSIS — H40053 Ocular hypertension, bilateral: Secondary | ICD-10-CM | POA: Diagnosis not present

## 2020-04-19 DIAGNOSIS — H53453 Other localized visual field defect, bilateral: Secondary | ICD-10-CM | POA: Diagnosis not present

## 2020-04-19 DIAGNOSIS — H18511 Endothelial corneal dystrophy, right eye: Secondary | ICD-10-CM | POA: Diagnosis not present

## 2020-04-19 DIAGNOSIS — M79662 Pain in left lower leg: Secondary | ICD-10-CM | POA: Diagnosis not present

## 2020-04-19 DIAGNOSIS — H2513 Age-related nuclear cataract, bilateral: Secondary | ICD-10-CM | POA: Diagnosis not present

## 2020-04-19 NOTE — Assessment & Plan Note (Signed)
Patient did have a fall.  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  On ultrasound and x-ray known distinct cortical irregularity noted.  Questionable early distal radius or potentially even mildly over the scaphoid.  Patient having more pain over the triquetrum than anywhere else.  Discussed with patient that we will put her in a thumb spica splint at this time to wear most of the time.  We discussed range of motion exercises starting after the first week.  Patient will be able to after 2 weeks start moving things around the house.  Patient will follow up with me again in 3 weeks and if necessary may need to consider further imaging.  Patient will call if any other significant changes occur.

## 2020-04-19 NOTE — Telephone Encounter (Signed)
Spoke with patient who is going to come in one hour for xray and to see Dr. Tamala Julian.

## 2020-04-19 NOTE — Progress Notes (Signed)
[place] Phone: [phone] Subjective:    I'm seeing this patient by the request  of:  [name], MD  CC: Left wrist pain  [id number]  Lindsey Barrett is a 74 y.o. female coming in with complaint of left wrist pain. Patient feel last night after tripping over a power cord. [place] injury to radial side of wrist. Pain with movement.  Patient states that she cannot sit still no significant discomfort.  Patient does states that any movement gives more pain.  Pain points more on the radial side of the wrist.  Patient did have x-rays.  X-rays were independently visualized by me at this moment showed mainly very small nondisplaced distal radius but could be potentially chronic and difficult to assess.  Scaphoid appears to be unremarkable.  Over read pending    Past Medical History:  Diagnosis Date  . Anxiety   . Atrophic vaginitis   . Breast cancer ([place])   . 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 Breast cyst    x3  . DDD (degenerative disc disease)   . Diverticulosis   . Fasting hyperglycemia   . GERD (gastroesophageal reflux disease)   . Gilbert syndrome   . Glaucoma   . History of hiatal hernia   . HSV infection   . Hyperlipidemia   . Radiation    left breast 50.4 gray  . STD (sexually transmitted disease)    HSV   Past Surgical History:  Procedure Laterality Date  . APPENDECTOMY  1961  . BREAST CYST ASPIRATION     x2 right ,x1 left  . COLONOSCOPY  2007   neg  . DILATION AND CURETTAGE OF UTERUS  1975  . FLEXIBLE SIGMOIDOSCOPY     x2  . PELVIC LAPAROSCOPY    . RADIOACTIVE SEED GUIDED PARTIAL MASTECTOMY WITH AXILLARY SENTINEL LYMPH NODE BIOPSY Left 05/31/2015   Procedure: LEFT BREAST RADIOACTIVE SEED GUIDED LUMPECTOMY WITH LEFT AXILLARY SENTINEL LYMPH NODE BIOPSY;  Surgeon: [name], MD;  Location: [facility];  Service: General;   Laterality: Left;  . RE-EXCISION OF BREAST LUMPECTOMY Left 07/01/2015   Procedure: RE-EXCISION OF LEFT BREAST INFERIOR MARGIN;  Surgeon: [name], MD;  Location: [facility];  Service: General;  Laterality: Left;  . UPPER GASTROINTESTINAL ENDOSCOPY    . UPPER GI ENDOSCOPY  10/2010   Dr [name]  . WISDOM TOOTH EXTRACTION  1978   Social History   Socioeconomic History  . Marital status: Divorced    Spouse name: Not on file  . Number of children: 0  . Years of education: Not on file  . Highest education level: Not on file  Occupational History  . 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 Smoking status: Former Smoker    Types: Cigarettes    Quit date: 03/28/1999    Years since quitting: 21.0  . Smokeless tobacco: Never Used  . Tobacco comment: smoked 1967-2000, up to 3/4 ppd  Vaping Use  . Vaping Use: Never used  Substance and Sexual Activity  . Alcohol use: Yes    Alcohol/week: 10.0 standard drinks    Types: 10 Cans of beer per week  . Drug use: No  . Sexual activity: Not Currently    Birth control/protection: Post-menopausal    Comment: 1st intercourse 74 yo-More than 5 partners  Other Topics Concern  . 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      Regular exercise-no   Social Determinants of Health   Financial Resource Strain: Low Risk   . Difficulty of Paying Living Expenses: Not hard at all  Food Insecurity: No Food Insecurity  . Worried About Charity fundraiser in the Last Year: Never true  . Ran Out of Food in the Last Year: Never true  Transportation Needs: No Transportation Needs  . Lack of Transportation (Medical): No  . Lack of Transportation (Non-Medical): No  Physical Activity: Sufficiently Active  . Days of Exercise per Week: 5 days  . Minutes of Exercise per Session: 30 min  Stress: No Stress Concern Present  . Feeling of Stress : Not at all   Social Connections: Moderately Integrated  . Frequency of Communication with Friends and Family: More than three times a week  . Frequency of Social Gatherings with Friends and Family: More than three times a week  . Attends Religious Services: More than 4 times per year  . Active Member of Clubs or Organizations: Yes  . Attends Archivist Meetings: More than 4 times per year  . Marital Status: Divorced   Allergies  Allergen Reactions  . Arimidex [Anastrozole] Other (See Comments)    Unable to function, loopy, dizzy, and mentally fogginess  . Septra Ds [Sulfamethoxazole-Trimethoprim] Hives  . Sulfa Antibiotics   . Adhesive [Tape] Rash  . Other Rash   Family History  Problem Relation Age of Onset  . Lung cancer Mother        smoker  . Endometriosis Mother   . Cancer Mother        Bladder cancer  . Stomach cancer Maternal Grandmother   . Coronary artery disease Father   . Cancer Father 33       intestinal carcinoid tumors of the ileum  . Sudden death Sister        38 week old, congenital deformity  . Liver cancer Maternal Uncle   . Stroke Paternal Grandfather 74  . Leukemia Maternal Uncle   . Breast cancer Paternal Aunt        great paternal aunt-Age 61's  . Colon cancer Paternal Aunt        lived to 59; pat great aunt  . Vascular Disease Paternal Grandmother        thoracic aneurysm  . Esophageal cancer Neg Hx   . Rectal cancer Neg Hx   . Pancreatic cancer Neg Hx      Current Outpatient Medications (Cardiovascular):  .  ezetimibe (ZETIA) 10 MG tablet, Take 1 tablet by mouth once daily .  rosuvastatin (CRESTOR) 40 MG tablet, Take 1 tablet (40 mg total) by mouth daily.   Current Outpatient Medications (Analgesics):  .  aspirin EC 81 MG tablet, Take 1 tablet (81 mg total) by mouth daily. .  meloxicam (MOBIC) 15 MG tablet, Take 1 tablet (15 mg total) by mouth daily.   Current Outpatient Medications (Other):  .  bimatoprost (LUMIGAN) 0.01 % SOLN, Place 1  drop into both eyes at bedtime. .  brinzolamide (AZOPT) 1 % ophthalmic suspension, 1 drop 2 (two) times daily. .  calcium carbonate (TUMS - DOSED IN MG ELEMENTAL CALCIUM) 500 MG chewable tablet, Chew 1 tablet by mouth as needed for indigestion or heartburn (1000 mg).  .  Carboxymethylcellulose Sodium (REFRESH LIQUIGEL OP), Apply 1 drop to eye 4 (four) times daily as  needed. .  Cholecalciferol (VITAMIN D3 PO), Take 5,000 Int'l Units by mouth daily.  .  famotidine (PEPCID) 10 MG tablet, Take 1 tablet (10 mg total) by mouth daily. [name]  letrozole (FEMARA) 2.5 MG tablet, Take 1 tablet by mouth once daily .  sodium chloride (MURO 128) 5 % ophthalmic solution, Place 1 drop into both eyes 2 (two) times a day. Before Azopt   Reviewed prior external information including notes and imaging from  primary care provider As well as notes that were available from care everywhere and other healthcare systems.  Past medical history, social, surgical and family history all reviewed in electronic medical record.  No pertanent information unless stated regarding to the chief complaint.   Review of Systems:  No headache, visual changes, nausea, vomiting, diarrhea, constipation, dizziness, abdominal pain, skin rash, fevers, chills, night sweats, weight loss, swollen lymph nodes, body aches, joint swelling, chest pain, shortness of breath, mood changes. POSITIVE muscle aches  Objective  Blood pressure (!) 144/84, pulse 74, height 5' 4.5" (1.638 m), weight 167 lb (75.8 kg), SpO2 98 %.   General: No apparent distress alert and oriented x3 mood and affect normal, dressed appropriately.  HEENT: Pupils equal, extraocular movements intact  Respiratory: Patient's speak in full sentences and does not appear short of breath  Left wrist exam shows the patient does have mild pain over the distal radius.  More pain actually over the middle part of the palmar aspect of the wrist.  Mild pain over the [facility] joint as well.  No  swelling of the joints noted  Limited musculoskeletal ultrasound was performed and interpreted by Lyndal Pulley  Limited ultrasound of patient's left wrist shows the patient does have very mild increase in Doppler flow of the distal radius noted.  Staff is fairly unremarkable.  Patient otherwise has very nonspecific hypoechoic changes but no true cortical irregularity.    Impression and Recommendations:     The above documentation has been reviewed and is accurate and complete Lyndal Pulley, DO

## 2020-04-19 NOTE — Telephone Encounter (Signed)
Patient called stating that she has had a change since yesterday and was asking to be worked in today. I asked her what seems to be going on and she said "I would rather not say".  Would you be able to call her to discuss this?

## 2020-04-19 NOTE — Patient Instructions (Signed)
Thumb spica day and night for next 2 weeks Third week ok to come out and do activities around the house After first week ok to do exercises most days of the week Ice for pain See me again in 3 weeks

## 2020-05-12 ENCOUNTER — Ambulatory Visit: Payer: Self-pay

## 2020-05-12 ENCOUNTER — Ambulatory Visit (INDEPENDENT_AMBULATORY_CARE_PROVIDER_SITE_OTHER): Payer: Medicare Other | Admitting: Family Medicine

## 2020-05-12 ENCOUNTER — Ambulatory Visit (INDEPENDENT_AMBULATORY_CARE_PROVIDER_SITE_OTHER): Payer: Medicare Other

## 2020-05-12 ENCOUNTER — Other Ambulatory Visit: Payer: Self-pay

## 2020-05-12 ENCOUNTER — Encounter: Payer: Self-pay | Admitting: Family Medicine

## 2020-05-12 VITALS — BP 120/70 | HR 75 | Ht 64.5 in | Wt 167.0 lb

## 2020-05-12 DIAGNOSIS — M25532 Pain in left wrist: Secondary | ICD-10-CM

## 2020-05-12 NOTE — Progress Notes (Signed)
[facility] [address] [place] [address] Phone: [phone] Subjective:   I Lindsey Barrett am serving as a Neurosurgeon for Dr. Antoine Primas.  This visit occurred during the SARS-CoV-2 public health emergency.  Safety protocols were in place, including screening questions prior to the visit, additional usage of staff PPE, and extensive cleaning of exam room while observing appropriate contact time as indicated for disinfecting solutions.   I'm seeing this patient by the request  of:  Pincus Sanes, MD  CC: Left wrist follow-up  DPO:[id number]   04/19/2020 Patient did have a fall.  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  On ultrasound and x-ray known distinct cortical irregularity noted.  Questionable early distal radius or potentially even mildly over the scaphoid.  Patient having more pain over the triquetrum than anywhere else.  Discussed with patient that we will put her in a thumb spica splint at this time to wear most of the time.  We discussed range of motion exercises starting after the first week.  Patient will be able to after 2 weeks start moving things around the house.  Patient will follow up with me again in 3 weeks and if necessary may need to consider further imaging.  Patient will call if any other significant changes occur.  Update 05/12/2020 Lindsey Barrett is a 75 y.o. female coming in with complaint of left wrist pain. Patient states she is doing better. Not 100%. Not wearing the brace consistently. States that certain ADLs cause pain but she uses the brace when she is doing things she believes will cause pain. Strength is not at 100%. Nerve in the thumb is still tingly. States she has a cyst in the left wrist. Ulnar and radial deviation is painful.  States that she would feel if she is a 80% better overall.        Past Medical History:  Diagnosis Date  . Anxiety   . Atrophic vaginitis   . Breast cancer (HCC)   . Breast cancer of  upper-inner quadrant of left female breast (HCC) 05/20/2015  . Breast cyst    x3  . DDD (degenerative disc disease)   . Diverticulosis   . Fasting hyperglycemia   . GERD (gastroesophageal reflux disease)   . Gilbert syndrome   . Glaucoma   . History of hiatal hernia   . HSV infection   . Hyperlipidemia   . Radiation    left breast 50.4 gray  . STD (sexually transmitted disease)    HSV   Past Surgical History:  Procedure Laterality Date  . APPENDECTOMY  1961  . BREAST CYST ASPIRATION     x2 right ,x1 left  . COLONOSCOPY  2007   neg  . DILATION AND CURETTAGE OF UTERUS  1975  . FLEXIBLE SIGMOIDOSCOPY     x2  . PELVIC LAPAROSCOPY    . RADIOACTIVE SEED GUIDED PARTIAL MASTECTOMY WITH AXILLARY SENTINEL LYMPH NODE BIOPSY Left 05/31/2015   Procedure: LEFT BREAST RADIOACTIVE SEED GUIDED LUMPECTOMY WITH LEFT AXILLARY SENTINEL LYMPH NODE BIOPSY;  Surgeon: Emelia Loron, MD;  Location: [facility];  Service: General;  Laterality: Left;  . RE-EXCISION OF BREAST LUMPECTOMY Left 07/01/2015   Procedure: RE-EXCISION OF LEFT BREAST INFERIOR MARGIN;  Surgeon: Emelia Loron, MD;  Location: [facility];  Service: General;  Laterality: Left;  . UPPER GASTROINTESTINAL ENDOSCOPY    . UPPER GI ENDOSCOPY  10/2010   Dr Juanda Chance  . WISDOM TOOTH EXTRACTION  1978   Social History   Socioeconomic History  . Marital status: Divorced    Spouse name: Not on file  . Number of children: 0  . Years of education: Not on file  . Highest education level: Not on file  Occupational History  . 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 Smoking status: Former Smoker    Types: Cigarettes    Quit date: 03/28/1999    Years since quitting: 21.1  . Smokeless tobacco: Never Used  . Tobacco comment: smoked 1967-2000, up to 3/4 ppd  Vaping Use  . Vaping Use: Never used  Substance and Sexual Activity  . Alcohol use: Yes    Alcohol/week: 10.0 standard drinks     Types: 10 Cans of beer per week  . Drug use: No  . Sexual activity: Not Currently    Birth control/protection: Post-menopausal    Comment: 1st intercourse 75 yo-More than 5 partners  Other Topics Concern  . Not on file  Social History Narrative   Occupation:  Loss adjuster, 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      Regular exercise-no   Social Determinants of Health   Financial Resource Strain: Low Risk   . Difficulty of Paying Living Expenses: Not hard at all  Food Insecurity: No Food Insecurity  . Worried About Programme researcher, broadcasting/film/video in the Last Year: Never true  . Ran Out of Food in the Last Year: Never true  Transportation Needs: No Transportation Needs  . Lack of Transportation (Medical): No  . Lack of Transportation (Non-Medical): No  Physical Activity: Sufficiently Active  . Days of Exercise per Week: 5 days  . Minutes of Exercise per Session: 30 min  Stress: No Stress Concern Present  . Feeling of Stress : Not at all  Social Connections: Moderately Integrated  . Frequency of Communication with Friends and Family: More than three times a week  . Frequency of Social Gatherings with Friends and Family: More than three times a week  . Attends Religious Services: More than 4 times per year  . Active Member of Clubs or Organizations: Yes  . Attends Banker Meetings: More than 4 times per year  . Marital Status: Divorced   Allergies  Allergen Reactions  . Arimidex [Anastrozole] Other (See Comments)    Unable to function, loopy, dizzy, and mentally fogginess  . Septra Ds [Sulfamethoxazole-Trimethoprim] Hives  . Sulfa Antibiotics   . Adhesive [Tape] Rash  . Other Rash   Family History  Problem Relation Age of Onset  . Lung cancer Mother        smoker  . Endometriosis Mother   . Cancer Mother        Bladder cancer  . Stomach cancer Maternal Grandmother   . Coronary artery disease Father   . Cancer Father 38       intestinal carcinoid tumors of the ileum  .  Sudden death Sister        58 week old, congenital deformity  . Liver cancer Maternal Uncle   . Stroke Paternal Grandfather 35  . Leukemia Maternal Uncle   . Breast cancer Paternal Aunt        great paternal aunt-Age 23's  . Colon cancer Paternal Aunt        lived to 67; pat great aunt  . Vascular Disease Paternal Grandmother        thoracic aneurysm  . Esophageal cancer Neg Hx   .  Rectal cancer Neg Hx   . Pancreatic cancer Neg Hx      Current Outpatient Medications (Cardiovascular):  .  ezetimibe (ZETIA) 10 MG tablet, Take 1 tablet by mouth once daily .  rosuvastatin (CRESTOR) 40 MG tablet, Take 1 tablet (40 mg total) by mouth daily.   Current Outpatient Medications (Analgesics):  .  aspirin EC 81 MG tablet, Take 1 tablet (81 mg total) by mouth daily.   Current Outpatient Medications (Other):  .  bimatoprost (LUMIGAN) 0.01 % SOLN, Place 1 drop into both eyes at bedtime. .  brinzolamide (AZOPT) 1 % ophthalmic suspension, 1 drop 2 (two) times daily. .  calcium carbonate (TUMS - DOSED IN MG ELEMENTAL CALCIUM) 500 MG chewable tablet, Chew 1 tablet by mouth as needed for indigestion or heartburn (1000 mg).  .  Carboxymethylcellulose Sodium (REFRESH LIQUIGEL OP), Apply 1 drop to eye 4 (four) times daily as needed. .  Cholecalciferol (VITAMIN D3 PO), Take 5,000 Int'l Units by mouth daily.  .  famotidine (PEPCID) 10 MG tablet, Take 1 tablet (10 mg total) by mouth daily. [name]  letrozole (FEMARA) 2.5 MG tablet, Take 1 tablet by mouth once daily .  sodium chloride (MURO 128) 5 % ophthalmic solution, Place 1 drop into both eyes 2 (two) times a day. Before Azopt   Reviewed prior external information including notes and imaging from  primary care provider As well as notes that were available from care everywhere and other healthcare systems.  Past medical history, social, surgical and family history all reviewed in electronic medical record.  No pertanent information unless stated regarding  to the chief complaint.   Review of Systems:  No headache, visual changes, nausea, vomiting, diarrhea, constipation, dizziness, abdominal pain, skin rash, fevers, chills, night sweats, weight loss, swollen lymph nodes, body aches, joint swelling, chest pain, shortness of breath, mood changes. POSITIVE muscle aches  Objective  Blood pressure 120/70, pulse 75, height 5' 4.5" (1.638 m), weight 167 lb (75.8 kg), SpO2 97 %.   General: No apparent distress alert and oriented x3 mood and affect normal, dressed appropriately.  HEENT: Pupils equal, extraocular movements intact  Respiratory: Patient's speak in full sentences and does not appear short of breath  Cardiovascular: No lower extremity edema, non tender, no erythema  Patient's left wrist does have some very mild discomfort noted over the distal radius.  Patient has mild discomfort over the left CMC joint.  Negative grind test though.  No significant discomfort over the scaphoid.  Negative Tinel's. Contralateral wrist unremarkable.  Does lack the last 5 degrees of flexion and extension  Limited musculoskeletal ultrasound was performed and interpreted by Lyndal Pulley  Limited ultrasound of patient's left wrist shows that there is a very mild callus formation over the distal radius.  No cortical irregularities are noted otherwise.  Mild increase in neovascularization in the area.  Scaphoid bone seems to be unremarkable with mild arthritic changes.  Patient also has arthritic changes of the [facility] joint with a mild bone spur noted but fairly unremarkable otherwise. Impression: Only arthritic changes of the [facility] joint and questionable healing nondisplaced fracture of the distal radius but difficult to say.    Impression and Recommendations:     The above documentation has been reviewed and is accurate and complete Lyndal Pulley, DO

## 2020-05-12 NOTE — Assessment & Plan Note (Signed)
Patient seen today with some improvement.  On ultrasound today still shows some mild cortical irregularity at the base of the [facility] as well as the distal radius.  Scaphoid appears to be unremarkable with some mild increase in neovascularization.  X-rays ordered today one more time to make sure that there is no occult fracture that could be contributing.  Patient has made improvement and will continue to monitor.  There is some dilatation on ultrasound of the median nerve and will watch for signs and symptoms of carpal tunnel may need to consider the possibility of injection.  Follow-up with me again in 3 to 4 weeks

## 2020-05-12 NOTE — Patient Instructions (Addendum)
Good to see you Wrist xray today Continue the exercises  Can wear the brace less but wear with heavy lifting  And at night We will watch for signs of carpal tunnel See me again as scheduled we will talk about wrist and knee

## 2020-05-31 ENCOUNTER — Ambulatory Visit: Payer: Self-pay

## 2020-05-31 ENCOUNTER — Other Ambulatory Visit: Payer: Self-pay

## 2020-05-31 ENCOUNTER — Ambulatory Visit (INDEPENDENT_AMBULATORY_CARE_PROVIDER_SITE_OTHER): Payer: Medicare Other | Admitting: Family Medicine

## 2020-05-31 ENCOUNTER — Encounter: Payer: Self-pay | Admitting: Family Medicine

## 2020-05-31 VITALS — BP 142/74 | HR 104 | Ht 64.5 in | Wt 165.0 lb

## 2020-05-31 DIAGNOSIS — M25532 Pain in left wrist: Secondary | ICD-10-CM

## 2020-05-31 NOTE — Patient Instructions (Signed)
If anything gets worse you know where I am See me when you need me

## 2020-05-31 NOTE — Progress Notes (Signed)
[place] Phone: [phone] Subjective:   [name], am serving as a scribe for Dr. Hulan Saas. This visit occurred during the SARS-CoV-2 public health emergency.  Safety protocols were in place, including screening questions prior to the visit, additional usage of staff PPE, and extensive cleaning of exam room while observing appropriate contact time as indicated for disinfecting solutions.   I'm seeing this patient by the request  of:  [name], MD  CC:   [id number]   05/12/2020 Patient seen today with some improvement.  On ultrasound today still shows some mild cortical irregularity at the base of the [facility] as well as the distal radius.  Scaphoid appears to be unremarkable with some mild increase in neovascularization.  X-rays ordered today one more time to make sure that there is no occult fracture that could be contributing.  Patient has made improvement and will continue to monitor.  There is some dilatation on ultrasound of the median nerve and will watch for signs and symptoms of carpal tunnel may need to consider the possibility of injection.  Follow-up with me again in 3 to 4 weeks  Update 05/31/2020 Lindsey Barrett is a 75 y.o. female coming in with complaint of left wrist pain. Feels that wrist is 95% better. Not pattern to when she has pain in wrist. Tingling has also dissipated. Notes stiffness on some days.   Right knee pain is improving. Pain with coming down stairs over superior patella. Overall pain has improved.       Past Medical History:  Diagnosis Date  . Anxiety   . Atrophic vaginitis   . Breast cancer ([place])   . 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[place]) 05/20/2015  . Breast cyst    x3  . DDD (degenerative disc disease)   . Diverticulosis   . Fasting hyperglycemia   . GERD (gastroesophageal reflux disease)   . Gilbert syndrome   . Glaucoma   .  History of hiatal hernia   . HSV infection   . Hyperlipidemia   . Radiation    left breast 50.4 gray  . STD (sexually transmitted disease)    HSV   Past Surgical History:  Procedure Laterality Date  . APPENDECTOMY  1961  . BREAST CYST ASPIRATION     x2 right ,x1 left  . COLONOSCOPY  2007   neg  . DILATION AND CURETTAGE OF UTERUS  1975  . FLEXIBLE SIGMOIDOSCOPY     x2  . PELVIC LAPAROSCOPY    . RADIOACTIVE SEED GUIDED PARTIAL MASTECTOMY WITH AXILLARY SENTINEL LYMPH NODE BIOPSY Left 05/31/2015   Procedure: LEFT BREAST RADIOACTIVE SEED GUIDED LUMPECTOMY WITH LEFT AXILLARY SENTINEL LYMPH NODE BIOPSY;  Surgeon: [name], MD;  Location: [facility];  Service: General;  Laterality: Left;  . RE-EXCISION OF BREAST LUMPECTOMY Left 07/01/2015   Procedure: RE-EXCISION OF LEFT BREAST INFERIOR MARGIN;  Surgeon: [name], MD;  Location: [facility];  Service: General;  Laterality: Left;  . UPPER GASTROINTESTINAL ENDOSCOPY    . UPPER GI ENDOSCOPY  10/2010   Dr [name]  . WISDOM TOOTH EXTRACTION  1978   Social History   Socioeconomic History  . Marital status: Divorced    Spouse name: Not on file  . Number of children: 0  . Years of education: Not on file  . Highest education level: Not on file  Occupational History  . 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 Smoking status: Former Smoker    Types: Cigarettes    Quit date: 03/28/1999    Years since quitting: 21.1  . Smokeless tobacco: Never Used  . Tobacco comment: smoked 1967-2000, up to 3/4 ppd  Vaping Use  . Vaping Use: Never used  Substance and Sexual Activity  . Alcohol use: Yes    Alcohol/week: 10.0 standard drinks    Types: 10 Cans of beer per week  . Drug use: No  . Sexual activity: Not Currently    Birth control/protection: Post-menopausal    Comment: 1st intercourse 75 yo-More than 5 partners  Other Topics Concern  . 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      Regular exercise-no   Social Determinants of Health   Financial Resource Strain: Low Risk   . Difficulty of Paying Living Expenses: Not hard at all  Food Insecurity: No Food Insecurity  . Worried About Charity fundraiser in the Last Year: Never true  . Ran Out of Food in the Last Year: Never true  Transportation Needs: No Transportation Needs  . Lack of Transportation (Medical): No  . Lack of Transportation (Non-Medical): No  Physical Activity: Sufficiently Active  . Days of Exercise per Week: 5 days  . Minutes of Exercise per Session: 30 min  Stress: No Stress Concern Present  . Feeling of Stress : Not at all  Social Connections: Moderately Integrated  . Frequency of Communication with Friends and Family: More than three times a week  . Frequency of Social Gatherings with Friends and Family: More than three times a week  . Attends Religious Services: More than 4 times per year  . Active Member of Clubs or Organizations: Yes  . Attends Archivist Meetings: More than 4 times per year  . Marital Status: Divorced   Allergies  Allergen Reactions  . Arimidex [Anastrozole] Other (See Comments)    Unable to function, loopy, dizzy, and mentally fogginess  . Septra Ds [Sulfamethoxazole-Trimethoprim] Hives  . Sulfa Antibiotics   . Adhesive [Tape] Rash  . Other Rash   Family History  Problem Relation Age of Onset  . Lung cancer Mother        smoker  . Endometriosis Mother   . Cancer Mother        Bladder cancer  . Stomach cancer Maternal Grandmother   . Coronary artery disease Father   . Cancer Father 13       intestinal carcinoid tumors of the ileum  . Sudden death Sister        34 week old, congenital deformity  . Liver cancer Maternal Uncle   . Stroke Paternal Grandfather 74  . Leukemia Maternal Uncle   . Breast cancer Paternal Aunt        great paternal aunt-Age 63's  . Colon cancer  Paternal Aunt        lived to 17; pat great aunt  . Vascular Disease Paternal Grandmother        thoracic aneurysm  . Esophageal cancer Neg Hx   . Rectal cancer Neg Hx   . Pancreatic cancer Neg Hx      Current Outpatient Medications (Cardiovascular):  .  ezetimibe (ZETIA) 10 MG tablet, Take 1 tablet by mouth once daily .  rosuvastatin (CRESTOR) 40 MG tablet, Take 1 tablet (40 mg total) by mouth  daily.   Current Outpatient Medications (Analgesics):  .  aspirin EC 81 MG tablet, Take 1 tablet (81 mg total) by mouth daily.   Current Outpatient Medications (Other):  .  bimatoprost (LUMIGAN) 0.01 % SOLN, Place 1 drop into both eyes at bedtime. .  brinzolamide (AZOPT) 1 % ophthalmic suspension, 1 drop 2 (two) times daily. .  calcium carbonate (TUMS - DOSED IN MG ELEMENTAL CALCIUM) 500 MG chewable tablet, Chew 1 tablet by mouth as needed for indigestion or heartburn (1000 mg).  .  Carboxymethylcellulose Sodium (REFRESH LIQUIGEL OP), Apply 1 drop to eye 4 (four) times daily as needed. .  Cholecalciferol (VITAMIN D3 PO), Take 5,000 Int'l Units by mouth daily.  .  famotidine (PEPCID) 10 MG tablet, Take 1 tablet (10 mg total) by mouth daily. [name]  letrozole (FEMARA) 2.5 MG tablet, Take 1 tablet by mouth once daily .  sodium chloride (MURO 128) 5 % ophthalmic solution, Place 1 drop into both eyes 2 (two) times a day. Before Azopt   Reviewed prior external information including notes and imaging from  primary care provider As well as notes that were available from care everywhere and other healthcare systems.  Past medical history, social, surgical and family history all reviewed in electronic medical record.  No pertanent information unless stated regarding to the chief complaint.   Review of Systems:  No headache, visual changes, nausea, vomiting, diarrhea, constipation, dizziness, abdominal pain, skin rash, fevers, chills, night sweats, weight loss, swollen lymph nodes, body aches, joint  swelling, chest pain, shortness of breath, mood changes. POSITIVE muscle aches  Objective  Blood pressure (!) 142/74, pulse (!) 104, height 5' 4.5" (1.638 m), weight 165 lb (74.8 kg), SpO2 98 %.   General: No apparent distress alert and oriented x3 mood and affect normal, dressed appropriately.  HEENT: Pupils equal, extraocular movements intact  Respiratory: Patient's speak in full sentences and does not appear short of breath  Cardiovascular: No lower extremity edema, non tender, no erythema  Gait normal with good balance and coordination.  MSK: Left wrist exam shows the patient does have good range of motion.  Nontender on exam.  Good grip strength.  Very mild CMC arthritis noted.  Right knee does have some mild arthritic changes.  Trace amount of fluid noted in the patellofemoral joint but full range of motion.  No significant instability.  Limited musculoskeletal ultrasound was performed and interpreted by Lyndal Pulley  Limited ultrasound of patient's right distal radius does not show any significant bony abnormality.  No significant abnormality noted over the scaphoid bone at this time either.    Impression and Recommendations:     The above documentation has been reviewed and is accurate and complete Lyndal Pulley, DO

## 2020-05-31 NOTE — Assessment & Plan Note (Signed)
Patient is doing significantly better at this time.  Feeling 95% better.  X-rays have been unremarkable except for some CMC arthritis which is mild.  Encourage patient to continue to increase activity as tolerated, discontinue as much wearing the brace.  Follow-up with me again in 3 months

## 2020-06-13 ENCOUNTER — Encounter: Payer: Self-pay | Admitting: Hematology and Oncology

## 2020-06-13 DIAGNOSIS — Z853 Personal history of malignant neoplasm of breast: Secondary | ICD-10-CM | POA: Diagnosis not present

## 2020-07-01 DIAGNOSIS — R0981 Nasal congestion: Secondary | ICD-10-CM | POA: Diagnosis not present

## 2020-07-01 DIAGNOSIS — Z1152 Encounter for screening for COVID-19: Secondary | ICD-10-CM | POA: Diagnosis not present

## 2020-07-01 DIAGNOSIS — R070 Pain in throat: Secondary | ICD-10-CM | POA: Diagnosis not present

## 2020-08-05 DIAGNOSIS — H2513 Age-related nuclear cataract, bilateral: Secondary | ICD-10-CM | POA: Diagnosis not present

## 2020-08-05 DIAGNOSIS — H5213 Myopia, bilateral: Secondary | ICD-10-CM | POA: Diagnosis not present

## 2020-08-05 DIAGNOSIS — H40053 Ocular hypertension, bilateral: Secondary | ICD-10-CM | POA: Diagnosis not present

## 2020-08-05 DIAGNOSIS — H52223 Regular astigmatism, bilateral: Secondary | ICD-10-CM | POA: Diagnosis not present

## 2020-08-05 DIAGNOSIS — H524 Presbyopia: Secondary | ICD-10-CM | POA: Diagnosis not present

## 2020-08-05 DIAGNOSIS — H53453 Other localized visual field defect, bilateral: Secondary | ICD-10-CM | POA: Diagnosis not present

## 2020-08-05 DIAGNOSIS — H18511 Endothelial corneal dystrophy, right eye: Secondary | ICD-10-CM | POA: Diagnosis not present

## 2020-08-24 NOTE — Progress Notes (Signed)
[place] Phone: [phone] Subjective:   Lindsey Barrett, am serving as a scribe for Dr. Hulan Saas. This visit occurred during the SARS-CoV-2 public health emergency.  Safety protocols were in place, including screening questions prior to the visit, additional usage of staff PPE, and extensive cleaning of exam room while observing appropriate contact time as indicated for disinfecting solutions.   I'm seeing this patient by the request  of:  [name], MD  CC: Right knee pain  CBJ:[id number]   05/31/2020 Patient is doing significantly better at this time.  Feeling 95% better.  X-rays have been unremarkable except for some CMC arthritis which is mild.  Encourage patient to continue to increase activity as tolerated, discontinue as much wearing the brace.  Follow-up with me again in 3 months  Update 08/25/2020 Lindsey Barrett is a 75 y.o. female coming in with complaint of R knee pain.  History of patellofemoral arthritis. Patient states that she has had increase in pain over past 2 months. 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 Patient notes numbness from knees down after performances. Patient feels pain over quad tendon when going down stairs. Was walking fast going to grocery store this week and she felt pain in back of knee. Feels like there is a knot in back of knee due to "knot" in back of knee.   Patient notes pains that occur in multiple areas throughout her legs while she watches tv. Patient is concerned about blood clots every time she has pain in legs she states.    04/18/2020 Xray R knee  IMPRESSION: Small right knee effusion.  Barrett fracture or dislocation.  Moderate arthritic changes of the patellofemoral joint noted. Past Medical History:  Diagnosis Date  . Anxiety   . Atrophic vaginitis   . Breast cancer ([place])   . Breast cancer of upper-inner quadrant of left female breast  ([place]) 05/20/2015  . Breast cyst    x3  . DDD (degenerative disc disease)   . Diverticulosis   . Fasting hyperglycemia   . GERD (gastroesophageal reflux disease)   . Gilbert syndrome   . Glaucoma   . History of hiatal hernia   . HSV infection   . Hyperlipidemia   . Radiation    left breast 50.4 gray  . STD (sexually transmitted disease)    HSV   Past Surgical History:  Procedure Laterality Date  . APPENDECTOMY  1961  . BREAST CYST ASPIRATION     x2 right ,x1 left  . COLONOSCOPY  2007   neg  . DILATION AND CURETTAGE OF UTERUS  1975  . FLEXIBLE SIGMOIDOSCOPY     x2  . PELVIC LAPAROSCOPY    . RADIOACTIVE SEED GUIDED PARTIAL MASTECTOMY WITH AXILLARY SENTINEL LYMPH NODE BIOPSY Left 05/31/2015   Procedure: LEFT BREAST RADIOACTIVE SEED GUIDED LUMPECTOMY WITH LEFT AXILLARY SENTINEL LYMPH NODE BIOPSY;  Surgeon: [name], MD;  Location: [facility];  Service: General;  Laterality: Left;  . RE-EXCISION OF BREAST LUMPECTOMY Left 07/01/2015   Procedure: RE-EXCISION OF LEFT BREAST INFERIOR MARGIN;  Surgeon: [name], MD;  Location: [facility];  Service: General;  Laterality: Left;  . UPPER GASTROINTESTINAL ENDOSCOPY    . UPPER GI ENDOSCOPY  10/2010   Dr [name]  . WISDOM TOOTH EXTRACTION  1978   Social History   Socioeconomic History  . Marital status: Divorced  Spouse name: Not on file  . Number of children: 0  . Years of education: Not on file  . Highest education level: Not on file  Occupational History  . 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 Smoking status: Former Smoker    Types: Cigarettes    Quit date: 03/28/1999    Years since quitting: 21.4  . Smokeless tobacco: Never Used  . Tobacco comment: smoked 1967-2000, up to 3/4 ppd  Vaping Use  . Vaping Use: Never used  Substance and Sexual Activity  . Alcohol use: Yes    Alcohol/week: 10.0 standard drinks    Types: 10 Cans of beer per week  . Drug  use: Barrett  . Sexual activity: Not Currently    Birth control/protection: Post-menopausal    Comment: 1st intercourse 75 yo-More than 5 partners  Other Topics Concern  . 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      Regular exercise-Barrett   Social Determinants of Health   Financial Resource Strain: Low Risk   . Difficulty of Paying Living Expenses: Not hard at all  Food Insecurity: Barrett Food Insecurity  . Worried About Charity fundraiser in the Last Year: Never true  . Ran Out of Food in the Last Year: Never true  Transportation Needs: Barrett Transportation Needs  . Lack of Transportation (Medical): Barrett  . Lack of Transportation (Non-Medical): Barrett  Physical Activity: Sufficiently Active  . Days of Exercise per Week: 5 days  . Minutes of Exercise per Session: 30 min  Stress: Barrett Stress Concern Present  . Feeling of Stress : Not at all  Social Connections: Moderately Integrated  . Frequency of Communication with Friends and Family: More than three times a week  . Frequency of Social Gatherings with Friends and Family: More than three times a week  . Attends Religious Services: More than 4 times per year  . Active Member of Clubs or Organizations: Yes  . Attends Archivist Meetings: More than 4 times per year  . Marital Status: Divorced   Allergies  Allergen Reactions  . Arimidex [Anastrozole] Other (See Comments)    Unable to function, loopy, dizzy, and mentally fogginess  . Septra Ds [Sulfamethoxazole-Trimethoprim] Hives  . Sulfa Antibiotics   . Adhesive [Tape] Rash  . Other Rash   Family History  Problem Relation Age of Onset  . Lung cancer Mother        smoker  . Endometriosis Mother   . Cancer Mother        Bladder cancer  . Stomach cancer Maternal Grandmother   . Coronary artery disease Father   . Cancer Father 40       intestinal carcinoid tumors of the ileum  . Sudden death Sister        36 week old,  congenital deformity  . Liver cancer Maternal Uncle   . Stroke Paternal Grandfather 35  . Leukemia Maternal Uncle   . Breast cancer Paternal Aunt        great paternal aunt-Age 44's  . Colon cancer Paternal Aunt        lived to 20; pat great aunt  . Vascular Disease Paternal Grandmother        thoracic aneurysm  . Esophageal cancer Neg Hx   . Rectal cancer Neg Hx   . Pancreatic cancer Neg Hx      Current  Outpatient Medications (Cardiovascular):  .  ezetimibe (ZETIA) 10 MG tablet, Take 1 tablet by mouth once daily .  rosuvastatin (CRESTOR) 40 MG tablet, Take 1 tablet (40 mg total) by mouth daily.   Current Outpatient Medications (Analgesics):  .  aspirin EC 81 MG tablet, Take 1 tablet (81 mg total) by mouth daily.   Current Outpatient Medications (Other):  .  bimatoprost (LUMIGAN) 0.01 % SOLN, Place 1 drop into both eyes at bedtime. .  brinzolamide (AZOPT) 1 % ophthalmic suspension, 1 drop 2 (two) times daily. .  calcium carbonate (TUMS - DOSED IN MG ELEMENTAL CALCIUM) 500 MG chewable tablet, Chew 1 tablet by mouth as needed for indigestion or heartburn (1000 mg).  .  Carboxymethylcellulose Sodium (REFRESH LIQUIGEL OP), Apply 1 drop to eye 4 (four) times daily as needed. .  Cholecalciferol (VITAMIN D3 PO), Take 5,000 Int'l Units by mouth daily.  .  famotidine (PEPCID) 10 MG tablet, Take 1 tablet (10 mg total) by mouth daily. [name]  letrozole (FEMARA) 2.5 MG tablet, Take 1 tablet by mouth once daily .  sodium chloride (MURO 128) 5 % ophthalmic solution, Place 1 drop into both eyes 2 (two) times a day. Before Azopt   Reviewed prior external information including notes and imaging from  primary care provider As well as notes that were available from care everywhere and other healthcare systems.  Past medical history, social, surgical and family history all reviewed in electronic medical record.  Barrett pertanent information unless stated regarding to the chief complaint.   Review of  Systems:  Barrett headache, visual changes, nausea, vomiting, diarrhea, constipation, dizziness, abdominal pain, skin rash, fevers, chills, night sweats, weight loss, swollen lymph nodes, body aches, chest pain, shortness of breath, mood changes. POSITIVE muscle aches, joint swelling  Objective  Blood pressure 140/72, pulse 83, height 5' 4.5" (1.638 m), weight 169 lb (76.7 kg), SpO2 98 %.   General: Barrett apparent distress alert and oriented x3 mood and affect normal, dressed appropriately.  HEENT: Pupils equal, extraocular movements intact  Respiratory: Patient's speak in full sentences and does not appear short of breath  Cardiovascular: Barrett lower extremity edema, non tender, Barrett erythema  Gait normal with good balance and coordination.  MSK: Patient's right knee does show some swelling compared to the contralateral side.  Small effusion noted.  Lacks the last 5 degrees of flexion.  Full extension.  Mild lateral tracking of the patella noted.  Limited musculoskeletal ultrasound was performed and interpreted by Lyndal Pulley  Limited ultrasound of patient's right knee shows the patient does have effusion noted and moderate to severe narrowing of the patellofemoral joint noted.  Patient does have very mild lateral mild arthritic changes as well as mild arthritic changes of the medial joint space.  Barrett true Baker's cyst appreciated. Impression: Patellofemoral arthritis with effusion    Impression and Recommendations:     The above documentation has been reviewed and is accurate and complete Lyndal Pulley, DO

## 2020-08-25 ENCOUNTER — Ambulatory Visit (INDEPENDENT_AMBULATORY_CARE_PROVIDER_SITE_OTHER): Payer: Medicare Other | Admitting: Family Medicine

## 2020-08-25 ENCOUNTER — Other Ambulatory Visit: Payer: Self-pay

## 2020-08-25 ENCOUNTER — Ambulatory Visit: Payer: Self-pay

## 2020-08-25 ENCOUNTER — Encounter: Payer: Self-pay | Admitting: Family Medicine

## 2020-08-25 VITALS — BP 140/72 | HR 83 | Ht 64.5 in | Wt 169.0 lb

## 2020-08-25 DIAGNOSIS — M1711 Unilateral primary osteoarthritis, right knee: Secondary | ICD-10-CM

## 2020-08-25 DIAGNOSIS — M25561 Pain in right knee: Secondary | ICD-10-CM | POA: Diagnosis not present

## 2020-08-25 DIAGNOSIS — G8929 Other chronic pain: Secondary | ICD-10-CM | POA: Diagnosis not present

## 2020-08-25 NOTE — Assessment & Plan Note (Signed)
I believe the patient does have mild to moderate patellofemoral arthritis.  X-rays do not show the extent of it but that is shown on ultrasound.  Does have an effusion.  Patient declined injection.  We will start with home exercises, bracing with a Tru pull lite, icing regimen and discussed other treatment options.  Patient will follow up with me again in 6 weeks.  At that time worsening pain consider injections or formal physical therapy.  If any locking or giving out patient could be a candidate for advanced imaging.

## 2020-08-25 NOTE — Patient Instructions (Addendum)
Good to see you Exercises for the knee Brace for the right knee Ice 20 mins 2 times a day Pennsaid 2 times a day If worsening pain call me See me again in 6-8 weeks

## 2020-08-30 ENCOUNTER — Encounter: Payer: Self-pay | Admitting: Internal Medicine

## 2020-08-31 ENCOUNTER — Encounter: Payer: Self-pay | Admitting: Internal Medicine

## 2020-08-31 ENCOUNTER — Telehealth (INDEPENDENT_AMBULATORY_CARE_PROVIDER_SITE_OTHER): Payer: Medicare Other | Admitting: Internal Medicine

## 2020-08-31 DIAGNOSIS — U071 COVID-19: Secondary | ICD-10-CM | POA: Insufficient documentation

## 2020-08-31 NOTE — Assessment & Plan Note (Signed)
Acute Symptoms started 4/22-today's day 6, tested positive yesterday with home test Symptoms relatively mild and improving Continue symptomatic treatment Discussed quarantine-she has technically quarantine for 5 days and no longer needs to quarantine.  Do recommend continuing to wear a mask-which she does religious. She plans on continuing quarantine for few more days and then when going out she will wear a mask Advised her to continue symptomatic treatment and call if she has any questions regarding her symptoms or if she feels like she is not continuing to improve Discussed booster #2, fourth shot-okay to get when she is fully recovered

## 2020-08-31 NOTE — Progress Notes (Signed)
Virtual Visit via Video Note  I connected with Lindsey Barrett on 08/31/20 at  3:20 PM EDT by a video enabled telemedicine application and verified that I am speaking with the correct person using two identifiers.   I discussed the limitations of evaluation and management by telemedicine and the availability of in person appointments. The patient expressed understanding and agreed to proceed.  Present for the visit:  Myself, Dr Billey Gosling, [name].  The patient is currently at home and I am in the office.    No referring provider.    History of Present Illness: This is an acute visit for covid  She Started having symptoms Friday, 4/22.  It started with a tickle in her throat, some mild lung congestion with very mild cough.  She was out in public and thought this may have been some allergies or related to the pollen.  She was wearing her N95 mask.  The next couple of days she continued to have similar symptoms and developed a head ache and eventually more of a head cold with nasal congestion, sneezing.  She treated her symptoms with nasal sprays, Advil on occasion, cold and warm compresses and used a Nettie pot.  She did not have any shortness of breath, sore throat or fevers.  The next day, 2 days ago she was 100% better.  Her head cold was almost gone.  Yesterday she developed some diarrhea which is not unusual for her-whenever she gets sick she does get some diarrhea.  She had a little decreased appetite and stuck to a brat diet.  She did a home COVID test and it was positive.  Her diarrhea did improve.  Today she had 1 bowel movement.  She had some cereal and yogurt to eat.  Overall she feels her symptoms were mild.  She feels she is recovering.  She will continue to eat a bland diet and take medications that she is been taking as needed. She does have questions regarding quarantine.   Social History   Socioeconomic History  . Marital status: Divorced    Spouse name: Not on  file  . Number of children: 0  . Years of education: Not on file  . Highest education level: Not on file  Occupational History  . 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 Smoking status: Former Smoker    Types: Cigarettes    Quit date: 03/28/1999    Years since quitting: 21.4  . Smokeless tobacco: Never Used  . Tobacco comment: smoked 1967-2000, up to 3/4 ppd  Vaping Use  . Vaping Use: Never used  Substance and Sexual Activity  . Alcohol use: Yes    Alcohol/week: 10.0 standard drinks    Types: 10 Cans of beer per week  . Drug use: No  . Sexual activity: Not Currently    Birth control/protection: Post-menopausal    Comment: 1st intercourse 75 yo-More than 5 partners  Other Topics Concern  . 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      Regular exercise-no   Social Determinants of Health   Financial Resource Strain: Low Risk   . Difficulty of Paying Living Expenses: Not hard at all  Food Insecurity: No Food Insecurity  . Worried About Charity fundraiser in the Last Year: Never true  . Ran Out of Food in the Last Year: Never true  Transportation Needs:  No Transportation Needs  . Lack of Transportation (Medical): No  . Lack of Transportation (Non-Medical): No  Physical Activity: Sufficiently Active  . Days of Exercise per Week: 5 days  . Minutes of Exercise per Session: 30 min  Stress: No Stress Concern Present  . Feeling of Stress : Not at all  Social Connections: Moderately Integrated  . Frequency of Communication with Friends and Family: More than three times a week  . Frequency of Social Gatherings with Friends and Family: More than three times a week  . Attends Religious Services: More than 4 times per year  . Active Member of Clubs or Organizations: Yes  . Attends Archivist Meetings: More than 4 times per year  . Marital Status: Divorced      Observations/Objective: Appears well in NAD Breathing normally Skin appears warm and dry  Assessment and Plan:  See Problem List for Assessment and Plan of chronic medical problems.   Follow Up Instructions:    I discussed the assessment and treatment plan with the patient. The patient was provided an opportunity to ask questions and all were answered. The patient agreed with the plan and demonstrated an understanding of the instructions.   The patient was advised to call back or seek an in-person evaluation if the symptoms worsen or if the condition fails to improve as anticipated.    [name], MD

## 2020-09-14 NOTE — Progress Notes (Signed)
Patient Care Team: [name], MD as PCP - General (Internal Medicine) Fontaine, Belinda Block, MD (Inactive) as Consulting Physician (Gynecology) Armbruster, Carlota Raspberry, MD as Consulting Physician (Gastroenterology) Calvert Cantor, MD as Consulting Physician (Ophthalmology)  DIAGNOSIS:    ICD-10-CM   1. Malignant neoplasm of upper-inner quadrant of left breast in female, estrogen receptor positive ([place])  C50.212    Z17.0     SUMMARY OF ONCOLOGIC HISTORY: Oncology History  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Lindsey Barrett)  05/18/2015 Initial Diagnosis   Left breast biopsy: Invasive ductal carcinoma low to intermediate grade, ER 100%, PR 95%, HER-2 negative ratio 1.02, Ki-67 5%, left breast asymmetry 1.3 cm, T1c N0 stage IA   05/31/2015 Surgery   Left lumpectomy [name]): Invasive ductal carcinoma grade 2, 1.5 cm, 0/3 lymph nodes negative, broadly involves inferior margin, ER 100%, PR 95%, HER-2 negative duration 112, Ki-67 5%, T1 cN0 stage IA, Oncotype DX score 18, 12% ROR   05/31/2015 Oncotype testing   Oncotype DX score: 18/12% ROR   07/01/2015 Surgery   Reexcision of the left inferior margin: Fibrocystic changes with UDH   08/10/2015 - 09/16/2015 Radiation Therapy   Adj XRT ([facility]). 50.4 Gy in 28 fractions to the left breast   10/06/2015 -  Anti-estrogen oral therapy   Anastrozole 1 mg by mouth daily 5 years, switched to letrozole 11/09/2015 due to dizziness lightheadedness and fatigue     CHIEF COMPLIANT: Follow-up of left breast cancer on letrozole   INTERVAL HISTORY: Lindsey Barrett is a 75 y.o. with above-mentioned history of left breast cancer treated with lumpectomy,radiation,and is currently on oral antiestrogen therapy with letrozole. Mammogram on 06/13/20 showed no evidence of malignancy bilaterally. She presents to the clinic today for follow-up.  ALLERGIES:  is allergic to arimidex [anastrozole], septra ds [sulfamethoxazole-trimethoprim], sulfa  antibiotics, adhesive [tape], and other.  MEDICATIONS:  Current Outpatient Medications  Medication Sig Dispense Refill  . aspirin EC 81 MG tablet Take 1 tablet (81 mg total) by mouth daily.    . bimatoprost (LUMIGAN) 0.01 % SOLN Place 1 drop into both eyes at bedtime.    . brinzolamide (AZOPT) 1 % ophthalmic suspension 1 drop 2 (two) times daily.    . calcium carbonate (TUMS - DOSED IN MG ELEMENTAL CALCIUM) 500 MG chewable tablet Chew 1 tablet by mouth as needed for indigestion or heartburn (1000 mg).     . Carboxymethylcellulose Sodium (REFRESH LIQUIGEL OP) Apply 1 drop to eye 4 (four) times daily as needed.    . Cholecalciferol (VITAMIN D3 PO) Take 5,000 Int'l Units by mouth daily.     [name] ezetimibe (ZETIA) 10 MG tablet Take 1 tablet by mouth once daily 30 tablet 11  . famotidine (PEPCID) 10 MG tablet Take 1 tablet (10 mg total) by mouth daily. 90 tablet 3  . letrozole (FEMARA) 2.5 MG tablet Take 1 tablet by mouth once daily 90 tablet 3  . rosuvastatin (CRESTOR) 40 MG tablet Take 1 tablet (40 mg total) by mouth daily. 90 tablet 3  . sodium chloride (MURO 128) 5 % ophthalmic solution Place 1 drop into both eyes 2 (two) times a day. Before Azopt     No current facility-administered medications for this visit.    PHYSICAL EXAMINATION: ECOG PERFORMANCE STATUS: 1 - Symptomatic but completely ambulatory  Vitals:   09/15/20 1533  BP: 139/67  Pulse: 80  Resp: 17  Temp: 98.1 F (36.7 C)  SpO2: 98%   Filed Weights  09/15/20 1533  Weight: 168 lb 6.4 oz (76.4 kg)    BREAST: No palpable masses or nodules in either right or left breasts. No palpable axillary supraclavicular or infraclavicular adenopathy no breast tenderness or nipple discharge. (exam performed in the presence of a chaperone)  LABORATORY DATA:  I have reviewed the data as listed CMP Latest Ref Rng & Units 02/19/2020 11/04/2019 01/22/2019  Glucose 70 - 99 mg/dL 97 94 97  BUN 6 - 23 mg/dL _0 Creatinine 0.40 - 1.20  mg/dL 0.67 0.63 0.65  Sodium 135 - 145 mEq/L 141 142 142  Potassium 3.5 - 5.1 mEq/L 4.3 4.5 4.5  Chloride 96 - 112 mEq/L 105 104 104  CO2 19 - 32 mEq/L 31 23 32  Calcium 8.4 - 10.5 mg/dL 9.1 9.4 9.5  Total Protein 6.0 - 8.3 g/dL 7.0 6.8 6.9  Total Bilirubin 0.2 - 1.2 mg/dL 1.3(H) 1.1 1.6(H)  Alkaline Phos 39 - 117 U/L 53 73 58  AST 0 - 37 U/L _1 ALT 0 - 35 U/L _2 Lab Results  Component Value Date   WBC 8.0 11/04/2019   HGB 14.0 11/04/2019   HCT 43.8 11/04/2019   MCV 90 11/04/2019   PLT 246 11/04/2019   NEUTROABS 3.3 01/22/2019    ASSESSMENT & PLAN:  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[place]) Left lumpectomy 05/31/2015: Invasive ductal carcinoma grade 2, 1.5 cm, 0/3 lymph nodes negative, broadly involves inferior margin, ER 100%, PR 95%, HER-2 negative, Ki-67 5%, T1 cN0 stage IA, Oncotype DX score 18, 12% ROR, Reexcision of margins negative  Adjuvant radiation therapy with Dr. Sondra Come 08/10/15- 09/16/15  Current treatment: Anastrozole 1 mg daily started June1st 2017 stopped 10/17/2015 due to dizziness lightheadedness and fatigue, switched to letrozole starting 11/09/2015  Letrozole toxicities: 1.Hot flashes [facility] and was able to tolerate it well.    Breast Cancer Surveillance: 1. Breast exam5/04/2021: Mild lymphedema 2. Mammogram2/7/2022at [facility]: Benign PET CT scan 07/29/2018: Evaluation of lung nodule: No hypermetabolic activity in the 8 mm right apical nodule  Return to clinic in1 year for follow-up     No orders of the defined types were placed in this encounter.  The patient has a good understanding of the overall plan. she agrees with it. she will call with any problems that may develop before the next visit here.  Total time spent: 20 mins including face to face time and time spent for planning, charting and coordination of care  [name], MD,  MPH 09/15/2020  I, Molly Dorshimer, am acting as scribe for Dr. Nicholas Lose.  I have reviewed the above documentation for accuracy and completeness, and I agree with the above.

## 2020-09-15 ENCOUNTER — Other Ambulatory Visit: Payer: Self-pay

## 2020-09-15 ENCOUNTER — Inpatient Hospital Stay: Payer: Medicare Other | Attending: Hematology and Oncology | Admitting: Hematology and Oncology

## 2020-09-15 DIAGNOSIS — Z79899 Other long term (current) drug therapy: Secondary | ICD-10-CM | POA: Diagnosis not present

## 2020-09-15 DIAGNOSIS — N951 Menopausal and female climacteric states: Secondary | ICD-10-CM | POA: Diagnosis not present

## 2020-09-15 DIAGNOSIS — C50212 Malignant neoplasm of upper-inner quadrant of left female breast: Secondary | ICD-10-CM | POA: Diagnosis not present

## 2020-09-15 DIAGNOSIS — Z170 Estrogen receptor positive status [ER+]: Secondary | ICD-10-CM | POA: Diagnosis not present

## 2020-09-15 DIAGNOSIS — Z79811 Long term (current) use of aromatase inhibitors: Secondary | ICD-10-CM | POA: Insufficient documentation

## 2020-09-15 DIAGNOSIS — R911 Solitary pulmonary nodule: Secondary | ICD-10-CM | POA: Insufficient documentation

## 2020-09-15 MED ORDER — LETROZOLE 2.5 MG PO TABS: 2.5000 mg | ORAL_TABLET | Freq: Every day | ORAL | 3 refills | Status: DC

## 2020-09-15 NOTE — Assessment & Plan Note (Signed)
Left lumpectomy 05/31/2015: Invasive ductal carcinoma grade 2, 1.5 cm, 0/3 lymph nodes negative, broadly involves inferior margin, ER 100%, PR 95%, HER-2 negative, Ki-67 5%, T1 cN0 stage IA, Oncotype DX score 18, 12% ROR, Reexcision of margins negative  Adjuvant radiation therapy with Dr. Sondra Come 08/10/15- 09/16/15  Current treatment: Anastrozole 1 mg daily started June1st 2017 stopped 10/17/2015 due to dizziness lightheadedness and fatigue, switched to letrozole starting 11/09/2015  Letrozole toxicities: 1.Hot flashes Patient [facility] and was able to tolerate it well. She was not able to find that supplier anymore. I discussed with her that she needs to try another manufacturer and see if she tolerates that.  Breast Cancer Surveillance: 1. Breast exam5/04/2021: Mild lymphedema 2. Mammogram2/7/2022at [facility]: Benign PET CT scan 07/29/2018: Evaluation of lung nodule: No hypermetabolic activity in the 8 mm right apical nodule  Return to clinic in1 year for follow-up

## 2020-09-22 ENCOUNTER — Telehealth: Payer: Self-pay | Admitting: Hematology and Oncology

## 2020-09-22 NOTE — Telephone Encounter (Signed)
Scheduled appointment per 05/12 los. Left a detailed message. 

## 2020-09-28 MED ORDER — EZETIMIBE 10 MG PO TABS: 10.0000 mg | ORAL_TABLET | Freq: Every day | ORAL | 5 refills | Status: DC

## 2020-10-02 ENCOUNTER — Encounter: Payer: Self-pay | Admitting: Internal Medicine

## 2020-10-02 DIAGNOSIS — R350 Frequency of micturition: Secondary | ICD-10-CM

## 2020-10-04 ENCOUNTER — Telehealth: Payer: Self-pay | Admitting: Internal Medicine

## 2020-10-04 NOTE — Progress Notes (Signed)
  Chronic Care Management   Outreach Note  10/04/2020 Name: Lindsey Barrett MRN: [id number] DOB: May 11, 1945  Referred by: [name], MD Reason for referral : No chief complaint on file.   A second unsuccessful telephone outreach was attempted today. The patient was referred to pharmacist for assistance with care management and care coordination.  Follow Up Plan:   [name] [facility] Scheduler

## 2020-10-05 ENCOUNTER — Telehealth: Payer: Self-pay | Admitting: Internal Medicine

## 2020-10-05 ENCOUNTER — Other Ambulatory Visit: Payer: Medicare Other

## 2020-10-05 MED ORDER — CEPHALEXIN 500 MG PO CAPS: 500.0000 mg | ORAL_CAPSULE | Freq: Two times a day (BID) | ORAL | 0 refills | Status: DC

## 2020-10-05 NOTE — Telephone Encounter (Signed)
Patient needs appointment if she returns call to clinic.  Please schedule

## 2020-10-05 NOTE — Addendum Note (Signed)
Addended by: [name] on: 10/05/2020 12:46 PM   Modules accepted: Orders

## 2020-10-05 NOTE — Addendum Note (Signed)
Addended by: [name] on: 10/05/2020 07:54 PM   Modules accepted: Orders

## 2020-10-05 NOTE — Telephone Encounter (Signed)
[facility] FYI:  Lindsey Barrett has been in contact with the doctor and she has either a UTI or a yeast infection and she isn't sure what to do about it. She said it's getting worse. She has had a little urgency with urination and discomfort  -Could not get in contact with patient

## 2020-10-07 ENCOUNTER — Other Ambulatory Visit: Payer: Self-pay

## 2020-10-07 ENCOUNTER — Other Ambulatory Visit (INDEPENDENT_AMBULATORY_CARE_PROVIDER_SITE_OTHER): Payer: Medicare Other

## 2020-10-07 ENCOUNTER — Telehealth: Payer: Self-pay | Admitting: Internal Medicine

## 2020-10-07 DIAGNOSIS — R350 Frequency of micturition: Secondary | ICD-10-CM

## 2020-10-07 LAB — URINALYSIS, ROUTINE W REFLEX MICROSCOPIC
Bilirubin Urine: NEGATIVE
Ketones, ur: NEGATIVE
Nitrite: POSITIVE — AB
Specific Gravity, Urine: 1.02 (ref 1.000–1.030)
Total Protein, Urine: NEGATIVE
Urine Glucose: NEGATIVE
Urobilinogen, UA: 0.2 (ref 0.0–1.0)
pH: 6 (ref 5.0–8.0)

## 2020-10-07 MED ORDER — AMOXICILLIN-POT CLAVULANATE 875-125 MG PO TABS: 1.0000 | ORAL_TABLET | Freq: Two times a day (BID) | ORAL | 0 refills | Status: DC

## 2020-10-07 NOTE — Chronic Care Management (AMB) (Signed)
  Chronic Care Management   Note  10/07/2020 Name: Lindsey Barrett MRN: [id number] DOB: Jun 23, 1945  Lindsey Barrett is a 75 y.o. year old female who is a primary care patient of Burns, Claudina Lick, MD. I reached out to Lindsey Barrett by phone today in response to a referral sent by Ms. Catheryne C Falco's PCP, [name], MD.   [name] was given information about Chronic Care Management services today including:  1. CCM service includes personalized support from designated clinical staff supervised by her physician, including individualized plan of care and coordination with other care providers 2. 24/7 contact phone numbers for assistance for urgent and routine care needs. 3. Service will only be billed when office clinical staff spend 20 minutes or more in a month to coordinate care. 4. Only one practitioner may furnish and bill the service in a calendar month. 5. The patient may stop CCM services at any time (effective at the end of the month) by phone call to the office staff.   Patient agreed to services and verbal consent obtained.   Follow up plan:   [name] [facility] Scheduler

## 2020-10-09 ENCOUNTER — Encounter: Payer: Self-pay | Admitting: Internal Medicine

## 2020-10-09 LAB — URINE CULTURE

## 2020-10-10 ENCOUNTER — Telehealth: Payer: Self-pay | Admitting: Internal Medicine

## 2020-10-10 MED ORDER — CEPHALEXIN 500 MG PO CAPS: 500.0000 mg | ORAL_CAPSULE | Freq: Two times a day (BID) | ORAL | 0 refills | Status: DC

## 2020-10-10 NOTE — Telephone Encounter (Signed)
See if she has the keflex I had sent in earlier - I would recommend she take that.  If she does not have it I can send to pharmacy.  I would recommend she take a probiotic daily for the next 2-4 weeks.

## 2020-10-10 NOTE — Telephone Encounter (Signed)
Patient called and said that she has taken 1 pill of amoxicillin-clavulanate (AUGMENTIN) 875-125 MG tablet and she is experiencing lose stool, stomach cramps and rumbling. Please advise

## 2020-10-10 NOTE — Telephone Encounter (Signed)
Spoke with patient.

## 2020-10-10 NOTE — Telephone Encounter (Signed)
Keflex sent.     Ok to have a glass of wine

## 2020-10-11 ENCOUNTER — Inpatient Hospital Stay: Admission: RE | Admit: 2020-10-11 | Payer: Medicare Other | Source: Ambulatory Visit

## 2020-10-17 NOTE — Progress Notes (Signed)
[facility] [place] Phone: [phone] Subjective:   [name], am serving as a scribe for Dr. Hulan Saas.  This visit occurred during the SARS-CoV-2 public health emergency.  Safety protocols were in place, including screening questions prior to the visit, additional usage of staff PPE, and extensive cleaning of exam room while observing appropriate contact time as indicated for disinfecting solutions.    I'm seeing this patient by the request  of:  [name], MD  CC: For right knee pain  BWI:[id number]  08/25/2020 I believe the patient does have mild to moderate patellofemoral arthritis.  X-rays do not show the extent of it but that is shown on ultrasound.  Does have an effusion.  Patient declined injection.  We will start with home exercises, bracing with a Tru pull lite, icing regimen and discussed other treatment options.  Patient will follow up with me again in 6 weeks.  At that time worsening pain consider injections or formal physical therapy.  If any locking or giving out patient could be a candidate for advanced imaging.  Update 10/18/2020 Lindsey Barrett is a 75 y.o. female coming in with complaint of R knee pain. Patient states that she is unable to flex knee all the way. Has more bad then good days. Pain increases with stair climbing. Has not been icing, bracing or doing HEP.   Patient was on vacation last week and aggravated L glute/SI joint. Pain has been improving over past few days but is still present.   Has been using 5000IU of Vit D daily to combat joint pain while taking medication for previous history of breast cancer. Patient notes stiffness in all joints and wonders if this is coming from medication.        Past Medical History:  Diagnosis Date   Anxiety    Atrophic vaginitis    Breast cancer ([place])    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[facility]) 05/20/2015    Breast cyst    x3   DDD (degenerative disc disease)    Diverticulosis    Fasting hyperglycemia    GERD (gastroesophageal reflux disease)    [name] syndrome    Glaucoma    History of hiatal hernia    HSV infection    Hyperlipidemia    Radiation    left breast 50.4 gray   STD (sexually transmitted disease)    HSV   Past Surgical History:  Procedure Laterality Date   APPENDECTOMY  1961   BREAST CYST ASPIRATION     x2 right ,x1 left   COLONOSCOPY  2007   neg   DILATION AND CURETTAGE OF UTERUS  1975   FLEXIBLE SIGMOIDOSCOPY     x2   PELVIC LAPAROSCOPY     RADIOACTIVE SEED GUIDED PARTIAL MASTECTOMY WITH AXILLARY SENTINEL LYMPH NODE BIOPSY Left 05/31/2015   Procedure: LEFT BREAST RADIOACTIVE SEED GUIDED LUMPECTOMY WITH LEFT AXILLARY SENTINEL LYMPH NODE BIOPSY;  Surgeon: [name], MD;  Location: [facility];  Service: General;  Laterality: Left;   RE-EXCISION OF BREAST LUMPECTOMY Left 07/01/2015   Procedure: RE-EXCISION OF LEFT BREAST INFERIOR MARGIN;  Surgeon: [name], MD;  Location: [facility];  Service: General;  Laterality: Left;   UPPER GASTROINTESTINAL ENDOSCOPY     UPPER GI ENDOSCOPY  10/2010   Dr [name] TOOTH EXTRACTION  1978   Social History   Socioeconomic History  Marital status: Divorced    Spouse name: Not on file   Number of children: 0   Years of education: Not on file   Highest education level: Not on file  Occupational History   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  Tobacco Use   Smoking status: Former    Pack years: 0.00    Types: Cigarettes    Quit date: 03/28/1999    Years since quitting: 21.5   Smokeless tobacco: Never   Tobacco comments:    smoked 1967-2000, up to 3/4 ppd  Vaping Use   Vaping Use: Never used  Substance and Sexual Activity   Alcohol use: Yes    Alcohol/week: 10.0 standard drinks    Types: 10 Cans of beer per week   Drug use: No   Sexual activity: Not  Currently    Birth control/protection: Post-menopausal    Comment: 1st intercourse 75 yo-More than 5 partners  Other Topics Concern   Not on file  Social History Narrative   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      Regular exercise-no   Social Determinants of Health   Financial Resource Strain: Low Risk    Difficulty of Paying Living Expenses: Not hard at all  Food Insecurity: No Food Insecurity   Worried About Charity fundraiser in the Last Year: Never true   Arboriculturist in the Last Year: Never true  Transportation Needs: No Transportation Needs   Lack of Transportation (Medical): No   Lack of Transportation (Non-Medical): No  Physical Activity: Sufficiently Active   Days of Exercise per Week: 5 days   Minutes of Exercise per Session: 30 min  Stress: No Stress Concern Present   Feeling of Stress : Not at all  Social Connections: Moderately Integrated   Frequency of Communication with Friends and Family: More than three times a week   Frequency of Social Gatherings with Friends and Family: More than three times a week   Attends Religious Services: More than 4 times per year   Active Member of [employer] or Organizations: Yes   Attends Music therapist: More than 4 times per year   Marital Status: Divorced   Allergies  Allergen Reactions   Arimidex [Anastrozole] Other (See Comments)    Unable to function, loopy, dizzy, and mentally fogginess   Septra Ds [Sulfamethoxazole-Trimethoprim] Hives   Sulfa Antibiotics    Adhesive [Tape] Rash   Augmentin [Amoxicillin-Pot Clavulanate] Diarrhea   Other Rash   Family History  Problem Relation Age of Onset   Lung cancer Mother        smoker   Endometriosis Mother    Cancer Mother        Bladder cancer   Stomach cancer Maternal Grandmother    Coronary artery disease Father    Cancer Father 23       intestinal carcinoid tumors of the ileum   Sudden death Sister        66 week old, congenital  deformity   Liver cancer Maternal Uncle    Stroke Paternal Grandfather 85   Leukemia Maternal Uncle    Breast cancer Paternal Aunt        great paternal aunt-Age 45's   Colon cancer Paternal Aunt        lived to 22; pat great aunt   Vascular Disease Paternal Grandmother        thoracic aneurysm   Esophageal cancer Neg Hx  Rectal cancer Neg Hx    Pancreatic cancer Neg Hx      Current Outpatient Medications (Cardiovascular):    ezetimibe (ZETIA) 10 MG tablet, Take 1 tablet (10 mg total) by mouth daily.   rosuvastatin (CRESTOR) 40 MG tablet, Take 1 tablet (40 mg total) by mouth daily.   Current Outpatient Medications (Analgesics):    aspirin EC 81 MG tablet, Take 1 tablet (81 mg total) by mouth daily.   Current Outpatient Medications (Other):    bimatoprost (LUMIGAN) 0.01 % SOLN, Place 1 drop into both eyes at bedtime.   brinzolamide (AZOPT) 1 % ophthalmic suspension, 1 drop 2 (two) times daily.   calcium carbonate (TUMS - DOSED IN MG ELEMENTAL CALCIUM) 500 MG chewable tablet, Chew 1 tablet by mouth as needed for indigestion or heartburn (1000 mg).    Carboxymethylcellulose Sodium (REFRESH LIQUIGEL OP), Apply 1 drop to eye 4 (four) times daily as needed.   cephALEXin (KEFLEX) 500 MG capsule, Take 1 capsule (500 mg total) by mouth 2 (two) times daily.   Cholecalciferol (VITAMIN D3 PO), Take 5,000 Int'l Units by mouth daily.    famotidine (PEPCID) 10 MG tablet, Take 1 tablet (10 mg total) by mouth daily.   letrozole (FEMARA) 2.5 MG tablet, Take 1 tablet (2.5 mg total) by mouth daily.   sodium chloride (MURO 128) 5 % ophthalmic solution, Place 1 drop into both eyes 2 (two) times a day. Before Azopt   Reviewed prior external information including notes and imaging from  primary care provider As well as notes that were available from care everywhere and other healthcare systems.  Past medical history, social, surgical and family history all reviewed in electronic medical record.   No pertanent information unless stated regarding to the chief complaint.   Review of Systems:  No headache, visual changes, nausea, vomiting, diarrhea, constipation, dizziness, abdominal pain, skin rash, fevers, chills, night sweats, weight loss, swollen lymph nodes, body aches, joint swelling, chest pain, shortness of breath, mood changes. POSITIVE muscle aches  Objective  Blood pressure 128/68, pulse 72, height 5' 4.5" (1.638 m), weight 167 lb (75.8 kg), SpO2 98 %.   General: No apparent distress alert and oriented x3 mood and affect normal, dressed appropriately.  HEENT: Pupils equal, extraocular movements intact  Respiratory: Patient's speak in full sentences and does not appear short of breath  Cardiovascular: No lower extremity edema, non tender, no erythema  Gait mild antalgic still favoring the right knee.  Patient does have some tenderness to palpation in the paraspinal musculature lumbar spine left greater than right.  Mild worsening pain with Corky Sox more than a straight leg test.  No radicular symptoms. MSK: Right knee exam still shows some arthritic changes.  Patient does lack the last 10 degrees of flexion.  Patient does have some instability with valgus and varus force.  Limited musculoskeletal ultrasound was performed and interpreted by Lyndal Pulley  Limited ultrasound of patient's right knee still shows the patient does have a effusion noted.  Moderate narrowing of the patellofemoral space as well as the medial space noted.  Consistent with previous exam.    Impression and Recommendations:

## 2020-10-18 ENCOUNTER — Other Ambulatory Visit: Payer: Self-pay

## 2020-10-18 ENCOUNTER — Ambulatory Visit (INDEPENDENT_AMBULATORY_CARE_PROVIDER_SITE_OTHER): Payer: Medicare Other

## 2020-10-18 ENCOUNTER — Ambulatory Visit: Payer: Self-pay

## 2020-10-18 ENCOUNTER — Encounter: Payer: Self-pay | Admitting: Family Medicine

## 2020-10-18 ENCOUNTER — Ambulatory Visit (INDEPENDENT_AMBULATORY_CARE_PROVIDER_SITE_OTHER): Payer: Medicare Other | Admitting: Family Medicine

## 2020-10-18 VITALS — BP 128/68 | HR 72 | Ht 64.5 in | Wt 167.0 lb

## 2020-10-18 DIAGNOSIS — M545 Low back pain, unspecified: Secondary | ICD-10-CM

## 2020-10-18 DIAGNOSIS — M2550 Pain in unspecified joint: Secondary | ICD-10-CM

## 2020-10-18 DIAGNOSIS — E038 Other specified hypothyroidism: Secondary | ICD-10-CM

## 2020-10-18 DIAGNOSIS — M25561 Pain in right knee: Secondary | ICD-10-CM

## 2020-10-18 DIAGNOSIS — R7989 Other specified abnormal findings of blood chemistry: Secondary | ICD-10-CM

## 2020-10-18 DIAGNOSIS — E559 Vitamin D deficiency, unspecified: Secondary | ICD-10-CM

## 2020-10-18 DIAGNOSIS — M1711 Unilateral primary osteoarthritis, right knee: Secondary | ICD-10-CM

## 2020-10-18 DIAGNOSIS — G8929 Other chronic pain: Secondary | ICD-10-CM

## 2020-10-18 LAB — SEDIMENTATION RATE: Sed Rate: 20 mm/hr (ref 0–30)

## 2020-10-18 LAB — CBC WITH DIFFERENTIAL/PLATELET
Basophils Absolute: 0 K/uL (ref 0.0–0.1)
Basophils Relative: 0.6 % (ref 0.0–3.0)
Eosinophils Absolute: 0.2 K/uL (ref 0.0–0.7)
Eosinophils Relative: 3.4 % (ref 0.0–5.0)
HCT: 40.8 % (ref 36.0–46.0)
Hemoglobin: 13.6 g/dL (ref 12.0–15.0)
Lymphocytes Relative: 26.6 % (ref 12.0–46.0)
Lymphs Abs: 1.9 K/uL (ref 0.7–4.0)
MCHC: 33.4 g/dL (ref 30.0–36.0)
MCV: 85.6 fl (ref 78.0–100.0)
Monocytes Absolute: 0.6 K/uL (ref 0.1–1.0)
Monocytes Relative: 9 % (ref 3.0–12.0)
Neutro Abs: 4.3 K/uL (ref 1.4–7.7)
Neutrophils Relative %: 60.4 % (ref 43.0–77.0)
Platelets: 255 K/uL (ref 150.0–400.0)
RBC: 4.76 Mil/uL (ref 3.87–5.11)
RDW: 14.1 % (ref 11.5–15.5)
WBC: 7.1 K/uL (ref 4.0–10.5)

## 2020-10-18 LAB — COMPREHENSIVE METABOLIC PANEL WITH GFR
ALT: 23 U/L (ref 0–35)
AST: 25 U/L (ref 0–37)
Albumin: 4.3 g/dL (ref 3.5–5.2)
Alkaline Phosphatase: 51 U/L (ref 39–117)
BUN: 12 mg/dL (ref 6–23)
CO2: 28 meq/L (ref 19–32)
Calcium: 9.4 mg/dL (ref 8.4–10.5)
Chloride: 104 meq/L (ref 96–112)
Creatinine, Ser: 0.63 mg/dL (ref 0.40–1.20)
GFR: 88.08 mL/min
Glucose, Bld: 119 mg/dL — ABNORMAL HIGH (ref 70–99)
Potassium: 4.1 meq/L (ref 3.5–5.1)
Sodium: 138 meq/L (ref 135–145)
Total Bilirubin: 1.7 mg/dL — ABNORMAL HIGH (ref 0.2–1.2)
Total Protein: 7.1 g/dL (ref 6.0–8.3)

## 2020-10-18 LAB — IBC PANEL
Iron: 71 ug/dL (ref 42–145)
Saturation Ratios: 21 % (ref 20.0–50.0)
Transferrin: 241 mg/dL (ref 212.0–360.0)

## 2020-10-18 LAB — VITAMIN D 25 HYDROXY (VIT D DEFICIENCY, FRACTURES): VITD: 75.78 ng/mL (ref 30.00–100.00)

## 2020-10-18 LAB — C-REACTIVE PROTEIN: CRP: <1 mg/dL (ref 0.5–20.0)

## 2020-10-18 LAB — URIC ACID: Uric Acid, Serum: 3.7 mg/dL (ref 2.4–7.0)

## 2020-10-18 LAB — TSH: TSH: 1.79 u[IU]/mL (ref 0.35–4.50)

## 2020-10-18 NOTE — Assessment & Plan Note (Signed)
Low back pain.  Discussed with patient icing regimen and home exercises, discussed which activities to do which wants to avoid.  Do think it is secondary to be multifactorial.  With history of breast cancer we will get x-rays to further evaluate.  Discussed icing regimen.  Follow-up again 6 to 8 weeks.

## 2020-10-18 NOTE — Patient Instructions (Signed)
Xray today PT [employer] D dimer See me again in 6-8 weeks

## 2020-10-18 NOTE — Assessment & Plan Note (Signed)
Any significant arthritic changes.  Patient wants to hold on any injection.  We will get laboratory work-up to rule out anything else that could be contributing.  Discussed icing regimen and home exercises.  Start with physical therapy as well.  Patient will follow up with me again 6 to 8 weeks

## 2020-10-20 LAB — PTH, INTACT AND CALCIUM
Calcium: 9.5 mg/dL (ref 8.6–10.4)
PTH: 20 pg/mL (ref 16–77)

## 2020-10-20 LAB — D-DIMER, QUANTITATIVE: D-Dimer, Quant: 0.28 mcg/mL FEU (ref <0.50)

## 2020-10-20 LAB — CALCIUM, IONIZED: Calcium, Ion: 5.09 mg/dL (ref 4.8–5.6)

## 2020-10-20 LAB — ANA: Anti Nuclear Antibody (ANA): NEGATIVE

## 2020-10-20 LAB — ANGIOTENSIN CONVERTING ENZYME: Angiotensin-Converting Enzyme: 26 U/L (ref 9–67)

## 2020-10-20 LAB — RHEUMATOID FACTOR: Rheumatoid fact SerPl-aCnc: <14 IU/mL (ref <14)

## 2020-10-20 LAB — CYCLIC CITRUL PEPTIDE ANTIBODY, IGG: Cyclic Citrullin Peptide Ab: <16 UNITS

## 2020-10-24 ENCOUNTER — Ambulatory Visit (INDEPENDENT_AMBULATORY_CARE_PROVIDER_SITE_OTHER): Payer: Medicare Other | Admitting: Obstetrics and Gynecology

## 2020-10-24 ENCOUNTER — Other Ambulatory Visit (HOSPITAL_COMMUNITY)
Admission: RE | Admit: 2020-10-24 | Discharge: 2020-10-24 | Disposition: A | Discharge status: Home / Self Care | Payer: Medicare Other | Source: Ambulatory Visit | Attending: Obstetrics and Gynecology | Admitting: Obstetrics and Gynecology

## 2020-10-24 ENCOUNTER — Ambulatory Visit
Admission: RE | Admit: 2020-10-24 | Discharge: 2020-10-24 | Disposition: A | Discharge status: Home / Self Care | Payer: Medicare Other | Source: Ambulatory Visit | Attending: Internal Medicine | Admitting: Internal Medicine

## 2020-10-24 ENCOUNTER — Other Ambulatory Visit: Payer: Self-pay

## 2020-10-24 ENCOUNTER — Encounter: Payer: Self-pay | Admitting: Obstetrics and Gynecology

## 2020-10-24 VITALS — BP 130/74 | Ht 64.5 in | Wt 165.0 lb

## 2020-10-24 DIAGNOSIS — N9089 Other specified noninflammatory disorders of vulva and perineum: Secondary | ICD-10-CM | POA: Diagnosis not present

## 2020-10-24 DIAGNOSIS — R911 Solitary pulmonary nodule: Secondary | ICD-10-CM

## 2020-10-24 NOTE — Patient Instructions (Signed)

## 2020-10-24 NOTE — Progress Notes (Signed)
GYNECOLOGY  VISIT   HPI: 75 y.o.   Divorced White or Caucasian Not Hispanic or Latino  female   [id number] with No LMP recorded. Patient is postmenopausal.   here for   labial bump. She noticed a lump on the left labia majora. It's not tender, no itching, no drainage. She first noticed it 3 weeks ago, she feels like it is getting bigger.   She was recently treated for a UTI.   H/O HSV  GYNECOLOGIC HISTORY: No LMP recorded. Patient is postmenopausal. Contraception:post menopausal Menopausal hormone therapy: none        OB History     Gravida  0   Para  0   Term  0   Preterm  0   AB  0   Living  0      SAB  0   IAB  0   Ectopic  0   Multiple  0   Live Births  0              Patient Active Problem List   Diagnosis Date Noted   Low back pain 10/18/2020   COVID-19 08/31/2020   Patellofemoral arthritis of right knee 08/25/2020   Left wrist pain 04/19/2020   Left leg pain 04/18/2020   Acute pain of right knee 03/09/2020   Sensorineural hearing loss (SNHL) of both ears 03/20/2019   Deviated nasal septum 03/02/2019   Pulmonary nodule 07/29/2018   Dupuytren's contracture of both hands 07/11/2018   Ganglion cyst of volar aspect of left wrist 06/04/2018   Frozen shoulder 03/18/2018   Epistaxis 02/05/2018   Right rotator cuff tear 10/15/2017   Tinnitus of both ears 01/01/2017   Reaction to internal stress 05/29/2016   Prediabetes 08/10/2015   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[facility]) 05/20/2015   Open-angle glaucoma 06/17/2014   Vitamin D deficiency 10/18/2011   HSV infection    Atrophic vaginitis    VARICOSE VEINS, LOWER EXTREMITIES 09/28/2009   GERD 07/14/2009   Hyperlipidemia 12/26/2006   GILBERT'S SYNDROME 08/09/2006   DEGENERATIVE [place] DISEASE 08/09/2006    Past Medical History:  Diagnosis Date   Anxiety    Atrophic vaginitis    Breast cancer ([facility])    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[facility])  05/20/2015   Breast cyst    x3   DDD (degenerative disc disease)    Diverticulosis    Fasting hyperglycemia    GERD (gastroesophageal reflux disease)    [name] syndrome    Glaucoma    History of hiatal hernia    HSV infection    Hyperlipidemia    Radiation    left breast 50.4 gray   STD (sexually transmitted disease)    HSV    Past Surgical History:  Procedure Laterality Date   APPENDECTOMY  1961   BREAST CYST ASPIRATION     x2 right ,x1 left   COLONOSCOPY  2007   neg   DILATION AND CURETTAGE OF UTERUS  1975   FLEXIBLE SIGMOIDOSCOPY     x2   PELVIC LAPAROSCOPY     RADIOACTIVE SEED GUIDED PARTIAL MASTECTOMY WITH AXILLARY SENTINEL LYMPH NODE BIOPSY Left 05/31/2015   Procedure: LEFT BREAST RADIOACTIVE SEED GUIDED LUMPECTOMY WITH LEFT AXILLARY SENTINEL LYMPH NODE BIOPSY;  Surgeon: [name], MD;  Location: [facility];  Service: General;  Laterality: Left;   RE-EXCISION OF BREAST LUMPECTOMY Left 07/01/2015   Procedure: RE-EXCISION OF LEFT BREAST INFERIOR MARGIN;  Surgeon: [name], MD;  Location: [facility];  Service: General;  Laterality: Left;   UPPER GASTROINTESTINAL ENDOSCOPY     UPPER GI ENDOSCOPY  10/2010   Dr [name] TOOTH EXTRACTION  1978    Current Outpatient Medications  Medication Sig Dispense Refill   aspirin EC 81 MG tablet Take 1 tablet (81 mg total) by mouth daily.     bimatoprost (LUMIGAN) 0.01 % SOLN Place 1 drop into both eyes at bedtime.     brinzolamide (AZOPT) 1 % ophthalmic suspension 1 drop 2 (two) times daily.     calcium carbonate (TUMS - DOSED IN MG ELEMENTAL CALCIUM) 500 MG chewable tablet Chew 1 tablet by mouth as needed for indigestion or heartburn (1000 mg).      Carboxymethylcellulose Sodium (REFRESH LIQUIGEL OP) Apply 1 drop to eye 4 (four) times daily as needed.     Cholecalciferol (VITAMIN D3 PO) Take 5,000 Int'l Units by mouth daily.      ezetimibe (ZETIA) 10 MG tablet Take 1 tablet (10 mg  total) by mouth daily. 30 tablet 5   famotidine (PEPCID) 10 MG tablet Take 1 tablet (10 mg total) by mouth daily. 90 tablet 3   letrozole (FEMARA) 2.5 MG tablet Take 1 tablet (2.5 mg total) by mouth daily. 90 tablet 3   rosuvastatin (CRESTOR) 40 MG tablet Take 1 tablet (40 mg total) by mouth daily. 90 tablet 3   sodium chloride (MURO 128) 5 % ophthalmic solution Place 1 drop into both eyes 2 (two) times a day. Before Azopt     No current facility-administered medications for this visit.     ALLERGIES: Arimidex [anastrozole], Septra ds [sulfamethoxazole-trimethoprim], Sulfa antibiotics, Adhesive [tape], Augmentin [amoxicillin-pot clavulanate], and Other  Family History  Problem Relation Age of Onset   Lung cancer Mother        smoker   Endometriosis Mother    Cancer Mother        Bladder cancer   Stomach cancer Maternal Grandmother    Coronary artery disease Father    Cancer Father 70       intestinal carcinoid tumors of the ileum   Sudden death Sister        76 week old, congenital deformity   Liver cancer Maternal Uncle    Stroke Paternal Grandfather 42   Leukemia Maternal Uncle    Breast cancer Paternal Aunt        great paternal aunt-Age 53's   Colon cancer Paternal Aunt        lived to 8; pat great aunt   Vascular Disease Paternal Grandmother        thoracic aneurysm   Esophageal cancer Neg Hx    Rectal cancer Neg Hx    Pancreatic cancer Neg Hx     Social History   Socioeconomic History   Marital status: Divorced    Spouse name: Not on file   Number of children: 0   Years of education: Not on file   Highest education level: Not on file  Occupational History   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  Tobacco Use   Smoking status: Former    Pack years: 0.00    Types: Cigarettes    Quit date: 03/28/1999    Years since quitting: 21.5   Smokeless tobacco: Never   Tobacco comments:    smoked 1967-2000, up to 3/4 ppd  Vaping Use   Vaping Use: Never used   Substance and Sexual Activity  Alcohol use: Yes    Alcohol/week: 10.0 standard drinks    Types: 10 Cans of beer per week   Drug use: No   Sexual activity: Not Currently    Birth control/protection: Post-menopausal    Comment: 1st intercourse 75 yo-More than 5 partners  Other Topics Concern   Not on file  Social History Narrative   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      Regular exercise-no   Social Determinants of Health   Financial Resource Strain: Low Risk    Difficulty of Paying Living Expenses: Not hard at all  Food Insecurity: No Food Insecurity   Worried About Charity fundraiser in the Last Year: Never true   Arboriculturist in the Last Year: Never true  Transportation Needs: No Transportation Needs   Lack of Transportation (Medical): No   Lack of Transportation (Non-Medical): No  Physical Activity: Sufficiently Active   Days of Exercise per Week: 5 days   Minutes of Exercise per Session: 30 min  Stress: No Stress Concern Present   Feeling of Stress : Not at all  Social Connections: Moderately Integrated   Frequency of Communication with Friends and Family: More than three times a week   Frequency of Social Gatherings with Friends and Family: More than three times a week   Attends Religious Services: More than 4 times per year   Active Member of [employer] or Organizations: Yes   Attends Music therapist: More than 4 times per year   Marital Status: Divorced  Human resources officer Violence: Not on file    ROS  PHYSICAL EXAMINATION:    BP 130/74 (BP Location: Right Arm, Patient Position: Sitting, Cuff Size: Normal)   Ht 5' 4.5" (1.638 m)   Wt 165 lb (74.8 kg)   BMI 27.88 kg/m     General appearance: alert, cooperative and appears stated age   Pelvic: External genitalia:  on the lower left labia majora is a raised, firm, ridge of tissue no skin discoloration, not tender. The area is over 1.5 cm by 0.8 cm              Urethra:   normal appearing urethra with no masses, tenderness or lesions               The risks of the procedure were reviewed with the patient and a consent was signed. The area was cleansed with Hibiclens and injected with 1% lidocaine. A #11 blade was used to remove the abnormal area. The defect was closed with ~5 stitches of interrupted 4-0 vicryl. Hemostasis was excellent. The patient tolerated the procedure well.    1. Vulvar lesion Area of abnormality removed - Surgical pathology( / [facility])

## 2020-10-25 DIAGNOSIS — N762 Acute vulvitis: Secondary | ICD-10-CM | POA: Diagnosis not present

## 2020-10-25 DIAGNOSIS — N764 Abscess of vulva: Secondary | ICD-10-CM | POA: Diagnosis not present

## 2020-10-26 ENCOUNTER — Telehealth: Payer: Self-pay | Admitting: Internal Medicine

## 2020-10-26 LAB — SURGICAL PATHOLOGY

## 2020-10-26 NOTE — Progress Notes (Signed)
LMTCB

## 2020-10-26 NOTE — Telephone Encounter (Signed)
Pt is returning phone call from [place]. Pls regard; [phone]

## 2020-10-26 NOTE — Telephone Encounter (Signed)
Tanda Rockers, MD  Rosana Berger, CMA Call patient :  Study is unchanged, no further f/u needed/ cc to PCP    Spoke with pt and notified of results per Dr. Melvyn Novas. Pt verbalized understanding and denied any questions. Copy was sent to Dr Claiborne Billings as well as PCP, Dr Quay Burow.

## 2020-10-26 NOTE — Progress Notes (Signed)
Pt notified of results and copy to PCP and cards

## 2020-10-31 ENCOUNTER — Encounter: Payer: Self-pay | Admitting: Physical Therapy

## 2020-10-31 ENCOUNTER — Ambulatory Visit: Payer: Medicare Other | Attending: Family Medicine | Admitting: Physical Therapy

## 2020-10-31 ENCOUNTER — Other Ambulatory Visit: Payer: Self-pay

## 2020-10-31 DIAGNOSIS — M545 Low back pain, unspecified: Secondary | ICD-10-CM | POA: Diagnosis not present

## 2020-10-31 DIAGNOSIS — M25561 Pain in right knee: Secondary | ICD-10-CM

## 2020-10-31 NOTE — Therapy (Signed)
[place]. [place], [place], [address] Phone: [phone]   Fax:  [phone]  Physical Therapy Evaluation  Patient Details  Name: Lindsey Barrett MRN: [id number] Date of Birth: 11/07/1945 Referring Provider (PT): Creig Hines   Encounter Date: 10/31/2020   PT End of Session - 10/31/20 1135     Visit Number 1    Date for PT Re-Evaluation 01/31/21    Authorization Type [employer]    PT Start Time 1053    PT Stop Time 1135    PT Time Calculation (min) 42 min    Activity Tolerance Patient tolerated treatment well    Behavior During Therapy [facility] for tasks assessed/performed             Past Medical History:  Diagnosis Date   Anxiety    Atrophic vaginitis    Breast cancer ([place])    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[place]) 05/20/2015   Breast cyst    x3   DDD (degenerative disc disease)    Diverticulosis    Fasting hyperglycemia    GERD (gastroesophageal reflux disease)    [name] syndrome    Glaucoma    History of hiatal hernia    HSV infection    Hyperlipidemia    Radiation    left breast 50.4 gray   STD (sexually transmitted disease)    HSV    Past Surgical History:  Procedure Laterality Date   APPENDECTOMY  1961   BREAST CYST ASPIRATION     x2 right ,x1 left   COLONOSCOPY  2007   neg   DILATION AND CURETTAGE OF UTERUS  1975   FLEXIBLE SIGMOIDOSCOPY     x2   PELVIC LAPAROSCOPY     RADIOACTIVE SEED GUIDED PARTIAL MASTECTOMY WITH AXILLARY SENTINEL LYMPH NODE BIOPSY Left 05/31/2015   Procedure: LEFT BREAST RADIOACTIVE SEED GUIDED LUMPECTOMY WITH LEFT AXILLARY SENTINEL LYMPH NODE BIOPSY;  Surgeon: [name], MD;  Location: [place];  Service: General;  Laterality: Left;   RE-EXCISION OF BREAST LUMPECTOMY Left 07/01/2015   Procedure: RE-EXCISION OF LEFT BREAST INFERIOR MARGIN;  Surgeon: [name], MD;  Location: [place];   Service: General;  Laterality: Left;   UPPER GASTROINTESTINAL ENDOSCOPY     UPPER GI ENDOSCOPY  10/2010   Dr [name] TOOTH EXTRACTION  1978    There were no vitals filed for this visit.    Subjective Assessment - 10/31/20 1057     Subjective Patient reports that over the past few years she has not done exercises due to covid and other issues, reports a medication that she is on affected her hormones.  X-rays of the lumbar spine showed DDD, x-ray of the knee shows effusion, reports that she feels very weak int he core    Limitations Lifting;Standing;House hold activities    Patient Stated Goals be stronger, workout again    Currently in Pain? Yes    Pain Score 0-No pain    Pain Location Back   right knee   Pain Descriptors / Indicators Aching;Sore    Pain Onset More than a month ago    Pain Frequency Intermittent    Aggravating Factors  lifting luggage, bending will cause back pain, stairs hurts that knee, reports knee feels tight, reports stairs one at a time most times pain up to 5/10    Pain Relieving Factors some days are good and some are bad, pain  can be 0/10 much of the time    Effect of Pain on Daily Activities difficulty with stairs, lifting                [facility] PT Assessment - 10/31/20 0001       Assessment   Medical Diagnosis LBP, right knee pain and stiffness    Referring Provider (PT) Z. Smith    Onset Date/Surgical Date 09/30/20    Prior Therapy no      Precautions   Precautions None      Balance Screen   Has the patient fallen in the past 6 months No    Has the patient had a decrease in activity level because of a fear of falling?  No    Is the patient reluctant to leave their home because of a fear of falling?  No      Home Environment   Additional Comments 2 story home, does housework, does Haematologist and gardening      Prior Function   Level of Engineer, manufacturing systems work    Biomedical scientist sitting mostly at  [employer] no exercise in the past year or tow due to covid      Posture/Postural Control   Posture Comments decreased lordosis      ROM / Strength   AROM / PROM / Strength AROM;Strength      AROM   Overall AROM Comments Lumbar ROM decreased 25% with c/o stiffness,    AROM Assessment Site Knee    Right/Left Knee Right    Right Knee Extension 10    Right Knee Flexion 120      Strength   Overall Strength Comments 4/5      Flexibility   Soft Tissue Assessment /Muscle Length yes    Hamstrings mild tightness    Quadriceps tight    ITB mild tightness    Piriformis tight      Palpation   Palpation comment mild lateral tracking right patella, very tight in the lumbar psinals                        Objective measurements completed on examination: See above findings.                 PT Short Term Goals - 10/31/20 1139       PT SHORT TERM GOAL #1   Title I with HEP    Time 2    Period Weeks    Status New               PT Long Term Goals - 10/31/20 1141       PT LONG TERM GOAL #1   Title go up and down stairs step over step    Time 12    Period Weeks    Status New      PT LONG TERM GOAL #2   Title Patient able to perform ADLS without limitation in ROM    Time 12    Status New      PT LONG TERM GOAL #3   Title Patient able to perform ADLs with no pain.    Time 12    Period Weeks    Status New      PT LONG TERM GOAL #4   Title increase right knee AROM to 0-125 degrees flexion    Time 12    Period Weeks  Status New      PT LONG TERM GOAL #5   Title independent with advanced gym program    Time 12    Period Weeks    Status New                    Plan - 10/31/20 1136     Clinical Impression Statement Patient has right knee pain with effusion, and LBP with some DDD.  She reports that she has had some issues for the past 6 - 12 months, reports that she stopped exercising about 2 years ago with covid.   She reports difficulty lifting and bending, and a loss of ROM of the right knee, she is very tight in the lumbar pspinals, she has some lateral tracking of the right patella, mild LE tightness, weak core.  reports at times has to do stairs one at a time    Stability/Clinical Decision Making Stable/Uncomplicated    Rehab Potential Good    PT Frequency 1x / week    PT Duration 12 weeks    PT Treatment/Interventions ADLs/Self Care Home Management;Electrical Stimulation;Moist Heat;Traction;Gait training;Stair training;Functional mobility training;Therapeutic activities;Therapeutic exercise;Iontophoresis 4mg /ml Dexamethasone;Neuromuscular re-education;Balance training;Manual techniques;Patient/family education    PT Next Visit Plan work in the gym, work on core stability and VMO    Consulted and Agree with Plan of Care Patient             Patient will benefit from skilled therapeutic intervention in order to improve the following deficits and impairments:  Abnormal gait, Decreased range of motion, Difficulty walking, Increased muscle spasms, Pain, Impaired flexibility, Improper body mechanics, Decreased strength, Decreased mobility  Visit Diagnosis: Acute pain of right knee - Plan: PT plan of care cert/re-cert  Acute bilateral low back pain without sciatica - Plan: PT plan of care cert/re-cert     Problem List Patient Active Problem List   Diagnosis Date Noted   Low back pain 10/18/2020   COVID-19 08/31/2020   Patellofemoral arthritis of right knee 08/25/2020   Left wrist pain 04/19/2020   Left leg pain 04/18/2020   Acute pain of right knee 03/09/2020   Sensorineural hearing loss (SNHL) of both ears 03/20/2019   Deviated nasal septum 03/02/2019   Pulmonary nodule 07/29/2018   Dupuytren's contracture of both hands 07/11/2018   Ganglion cyst of volar aspect of left wrist 06/04/2018   Frozen shoulder 03/18/2018   Epistaxis 02/05/2018   Right rotator cuff tear 10/15/2017   Tinnitus  of both ears 01/01/2017   Reaction to internal stress 05/29/2016   Prediabetes 08/10/2015   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[place]) 05/20/2015   Open-angle glaucoma 06/17/2014   Vitamin D deficiency 10/18/2011   HSV infection    Atrophic vaginitis    VARICOSE VEINS, LOWER EXTREMITIES 09/28/2009   GERD 07/14/2009   Hyperlipidemia 12/26/2006   GILBERT'S SYNDROME 08/09/2006   DEGENERATIVE DISC DISEASE 08/09/2006    [name], PT 10/31/2020, 11:44 AM  [place]. [place], [place], [address] Phone: [phone]   Fax:  [phone]  Name: BATSHEVA STEVICK MRN: [id number] Date of Birth: 03-19-1946

## 2020-10-31 NOTE — Patient Instructions (Signed)
Access Code: [email or web address] URL: [email or web address] Date: 10/31/2020 Prepared by: Lum Babe  Exercises Hooklying Single Knee to Chest Stretch - 2 x daily - 7 x weekly - 1 sets - 10 reps - 10 hold Supine Double Knee to Chest - 2 x daily - 7 x weekly - 1 sets - 10 reps - 10 hold Supine Lower Trunk Rotation - 2 x daily - 7 x weekly - 1 sets - 10 reps - 10 hold Supine Quadriceps Stretch with Strap on Table - 2 x daily - 7 x weekly - 1 sets - 10 reps - 10 hold

## 2020-11-11 DIAGNOSIS — H40053 Ocular hypertension, bilateral: Secondary | ICD-10-CM | POA: Diagnosis not present

## 2020-11-11 DIAGNOSIS — H40023 Open angle with borderline findings, high risk, bilateral: Secondary | ICD-10-CM | POA: Diagnosis not present

## 2020-11-11 DIAGNOSIS — H53453 Other localized visual field defect, bilateral: Secondary | ICD-10-CM | POA: Diagnosis not present

## 2020-11-11 DIAGNOSIS — H2513 Age-related nuclear cataract, bilateral: Secondary | ICD-10-CM | POA: Diagnosis not present

## 2020-11-15 ENCOUNTER — Other Ambulatory Visit: Payer: Self-pay

## 2020-11-15 ENCOUNTER — Ambulatory Visit: Payer: Medicare Other | Attending: Family Medicine | Admitting: Physical Therapy

## 2020-11-15 DIAGNOSIS — M25561 Pain in right knee: Secondary | ICD-10-CM | POA: Insufficient documentation

## 2020-11-15 DIAGNOSIS — M545 Low back pain, unspecified: Secondary | ICD-10-CM | POA: Diagnosis not present

## 2020-11-15 NOTE — Therapy (Signed)
[place]. [place], [place], [address] Phone: [phone]   Fax:  [phone]  Physical Therapy Treatment  Patient Details  Name: Lindsey Barrett MRN: [id number] Date of Birth: Dec 30, 1945 Referring Provider (PT): Creig Hines   Encounter Date: 11/15/2020   PT End of Session - 11/15/20 1436     Visit Number 2    Date for PT Re-Evaluation 01/31/21    Authorization Type [employer]    PT Start Time 4259    PT Stop Time 5638    PT Time Calculation (min) 44 min    Activity Tolerance Patient tolerated treatment well    Behavior During Therapy [facility] for tasks assessed/performed             Past Medical History:  Diagnosis Date   Anxiety    Atrophic vaginitis    Breast cancer ([place])    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[place]) 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/2015   Breast cyst    x3   DDD (degenerative disc disease)    Diverticulosis    Fasting hyperglycemia    GERD (gastroesophageal reflux disease)    [name] syndrome    Glaucoma    History of hiatal hernia    HSV infection    Hyperlipidemia    Radiation    left breast 50.4 gray   STD (sexually transmitted disease)    HSV    Past Surgical History:  Procedure Laterality Date   APPENDECTOMY  1961   BREAST CYST ASPIRATION     x2 right ,x1 left   COLONOSCOPY  2007   neg   DILATION AND CURETTAGE OF UTERUS  1975   FLEXIBLE SIGMOIDOSCOPY     x2   PELVIC LAPAROSCOPY     RADIOACTIVE SEED GUIDED PARTIAL MASTECTOMY WITH AXILLARY SENTINEL LYMPH NODE BIOPSY Left 05/31/2015   Procedure: LEFT BREAST RADIOACTIVE SEED GUIDED LUMPECTOMY WITH LEFT AXILLARY SENTINEL LYMPH NODE BIOPSY;  Surgeon: [name], MD;  Location: [place];  Service: General;  Laterality: Left;   RE-EXCISION OF BREAST LUMPECTOMY Left 07/01/2015   Procedure: RE-EXCISION OF LEFT BREAST INFERIOR MARGIN;  Surgeon: [name], MD;  Location: [place];   Service: General;  Laterality: Left;   UPPER GASTROINTESTINAL ENDOSCOPY     UPPER GI ENDOSCOPY  10/2010   Dr [name] TOOTH EXTRACTION  1978    There were no vitals filed for this visit.   Subjective Assessment - 11/15/20 1405     Subjective Patient report sthat she has some back pain and pain in the posterior left knee    Currently in Pain? Yes    Pain Location Back    Pain Descriptors / Indicators Sore                               [facility] Adult PT Treatment/Exercise - 11/15/20 0001       Exercises   Exercises Lumbar      Lumbar Exercises: Aerobic   Nustep level 4 x 6 minutes      Lumbar Exercises: Machines for Strengthening   Cybex Knee Extension 5# 2x10    Cybex Knee Flexion 20# 2x10    Other Lumbar Machine Exercise seated row 15# lats 20# 2x10      Lumbar Exercises: Supine   Bridge with [employer] 15 reps    Bridge with clamshell 15 reps  Other Supine Lumbar Exercises feet on ball K2C, trunk rotation, small bridges      Lumbar Exercises: Sidelying   Clam Both;20 reps                      PT Short Term Goals - 11/15/20 1440       PT SHORT TERM GOAL #1   Title I with HEP    Status Partially Met               PT Long Term Goals - 10/31/20 1141       PT LONG TERM GOAL #1   Title go up and down stairs step over step    Time 12    Period Weeks    Status New      PT LONG TERM GOAL #2   Title Patient able to perform ADLS without limitation in ROM    Time 12    Status New      PT LONG TERM GOAL #3   Title Patient able to perform ADLs with no pain.    Time 12    Period Weeks    Status New      PT LONG TERM GOAL #4   Title increase right knee AROM to 0-125 degrees flexion    Time 12    Period Weeks    Status New      PT LONG TERM GOAL #5   Title independent with advanced gym program    Time 12    Period Weeks    Status New                   Plan - 11/15/20 1439     Clinical  Impression Statement Patient has some c/o back pain and knee pain, however after the exercises we did she reported feeling better and said she was not in pain.  We discussed what she can do on her own at home and are working on her safely going to the gym and discussed how to go slow and next week may give her the pounds that she is doing on the machines    PT Next Visit Plan work in the gym, work on core stability and [employer] and Agree with Plan of Care Patient             Patient will benefit from skilled therapeutic intervention in order to improve the following deficits and impairments:  Abnormal gait, Decreased range of motion, Difficulty walking, Increased muscle spasms, Pain, Impaired flexibility, Improper body mechanics, Decreased strength, Decreased mobility  Visit Diagnosis: Acute pain of right knee  Acute bilateral low back pain without sciatica     Problem List Patient Active Problem List   Diagnosis Date Noted   Low back pain 10/18/2020   COVID-19 08/31/2020   Patellofemoral arthritis of right knee 08/25/2020   Left wrist pain 04/19/2020   Left leg pain 04/18/2020   Acute pain of right knee 03/09/2020   Sensorineural hearing loss (SNHL) of both ears 03/20/2019   Deviated nasal septum 03/02/2019   Pulmonary nodule 07/29/2018   Dupuytren's contracture of both hands 07/11/2018   Ganglion cyst of volar aspect of left wrist 06/04/2018   Frozen shoulder 03/18/2018   Epistaxis 02/05/2018   Right rotator cuff tear 10/15/2017   Tinnitus of both ears 01/01/2017   Reaction to internal stress 05/29/2016   Prediabetes 08/10/2015   Breast cancer of upper-inner quadrant of left  female breast ([place]) 05/20/2015   Open-angle glaucoma 06/17/2014   Vitamin D deficiency 10/18/2011   HSV infection    Atrophic vaginitis    VARICOSE VEINS, LOWER EXTREMITIES 09/28/2009   GERD 07/14/2009   Hyperlipidemia 12/26/2006   GILBERT'S SYNDROME 08/09/2006   DEGENERATIVE [place]  DISEASE 08/09/2006    [name], PT 11/15/2020, 2:41 PM  [place]. [place], [place], [address] Phone: [phone]   Fax:  [phone]  Name: Lindsey Barrett MRN: [id number] Date of Birth: 17-May-1945

## 2020-11-16 ENCOUNTER — Other Ambulatory Visit: Payer: Self-pay

## 2020-11-16 ENCOUNTER — Ambulatory Visit (INDEPENDENT_AMBULATORY_CARE_PROVIDER_SITE_OTHER): Payer: Medicare Other | Admitting: Obstetrics and Gynecology

## 2020-11-16 ENCOUNTER — Telehealth: Payer: Self-pay

## 2020-11-16 ENCOUNTER — Encounter: Payer: Self-pay | Admitting: Obstetrics and Gynecology

## 2020-11-16 VITALS — BP 144/70 | HR 88 | Ht 64.5 in | Wt 163.4 lb

## 2020-11-16 DIAGNOSIS — N952 Postmenopausal atrophic vaginitis: Secondary | ICD-10-CM

## 2020-11-16 DIAGNOSIS — N762 Acute vulvitis: Secondary | ICD-10-CM | POA: Diagnosis not present

## 2020-11-16 LAB — WET PREP FOR TRICH, YEAST, CLUE

## 2020-11-16 MED ORDER — BETAMETHASONE VALERATE 0.1 % EX OINT: TOPICAL_OINTMENT | CUTANEOUS | 0 refills | Status: DC

## 2020-11-16 NOTE — Telephone Encounter (Signed)
Per Dr. Talbert Nan rec OV at 1:30pm today. Patient is agreeable. Appt scheduled.

## 2020-11-16 NOTE — Progress Notes (Deleted)
Chronic Care Management Pharmacy Note  11/17/2020 Name:  Lindsey Barrett MRN:  [id number] DOB:  05-16-1945  Summary: - Patient reports that overall she feels that she is doing well, reports that after last appointment with oncology it was decided that she will continue letrozole for an additional 2 years.  Patient was recently to ob/gyn for evaluation of irritation in groin area which has been getting better with steroid ointment.   -Patient's GERD under control with famotidine 50m daily for the most part, last LDL in range at 946mdL - lowest this has been for patient (started zetia prior to most recent result) - patient reports no issues or complaints from current medications   Recommendations/Changes made from today's visit: - No changes to medications at this time, advised for patient to continue to work with diet to help further reduce LDL, encouraged patient to increase exercise and activity to help keep LDL and A1c down and at goal, patient agreeable to plan  Subjective: Lindsey GLADHILLs an [age].o. year old female who is a primary patient of Burns, StClaudina LickMD.  The CCM team was consulted for assistance with disease management and care coordination needs.    Engaged with patient face to face for initial visit in response to provider referral for pharmacy case management and/or care coordination services.   Consent to Services:  The patient was given the following information about Chronic Care Management services today, agreed to services, and gave verbal consent: 1. CCM service includes personalized support from designated clinical staff supervised by the primary care provider, including individualized plan of care and coordination with other care providers 2. 24/7 contact phone numbers for assistance for urgent and routine care needs. 3. Service will only be billed when office clinical staff spend 20 minutes or more in a month to coordinate care. 4. Only one practitioner may  furnish and bill the service in a calendar month. 5.The patient may stop CCM services at any time (effective at the end of the month) by phone call to the office staff. 6. The patient will be responsible for cost sharing (co-pay) of up to 20% of the service fee (after annual deductible is met). Patient agreed to services and consent obtained.  Patient Care Team: BuBinnie RailMD as PCP - General (Internal Medicine) FoPhineas RealTiBelinda BlockMD (Inactive) as Consulting Physician (Gynecology) Armbruster, StCarlota RaspberryMD as Consulting Physician (Gastroenterology) DiCalvert CantorMD as Consulting Physician (Ophthalmology) SzDelice BisonaDarnelle MaffucciRPAmerican [facility] Centers Pharmacist (Pharmacist)  Recent office visits: 08/31/2020 - PCP - televideo visit - COVID positive 08/30/2020 - symptoms started 08/26/2020- feeling better, will continue to quarantine for a few days - discussed 2nd COVID booster shot once completely recovered   03/09/2020 - PCP visit - evaluation of right knee pain prescribed meloxicam and advised to ice knee   Recent consult visits: 11/16/2020 - Dr. JeTalbert Nan OB/GYN - acute vulvitis - prescribed betamethasone 0.1% ointment to use for up to 1 week  10/24/2020 - Dr. JeTalbert Nan OB/GYN - vulvar biopsy performed  10/18/2020 - Dr. SmTamala Julian Sports Medicine - chronic pain of right knee/ low back pain - x-rays completed, patient to follow up with PT - next office visit 6-8 weeks  09/15/2020 - Dr. GuLindi Adie Heme/Onc - follow up of left breast cancer on letrozole- mammogram 06/13/20 - shows no evidence of malignancy - no changes - f/u in 1 year   08/25/2020 - Dr. SmTamala Julian [facility] Medicine - chronic pain of  right knee - patellofemoral arthritis - offered injection which patient declined at this time, preferred to follow with PT - possible she could be a candidate for advanced imaging  05/31/2020 - Dr. Tamala Julian - Sports Medicine - evaluation for left wrist pain, x-rays unremarkable - patient feels that pain has improved by 95% - patient was  encouraged to increase activity as she is able - follow up in 3 months  Follows with Dr. Riki Sheer for [facility] visits: None in previous 6 months  Objective:  Lab Results  Component Value Date   CREATININE 0.63 10/18/2020   BUN 12 10/18/2020   GFR 88.08 10/18/2020   GFRNONAA 90 11/04/2019   GFRAA 104 11/04/2019   NA 138 10/18/2020   K 4.1 10/18/2020   CALCIUM 9.5 10/18/2020   CALCIUM 9.4 10/18/2020   CO2 28 10/18/2020   GLUCOSE 119 (H) 10/18/2020    Lab Results  Component Value Date/Time   HGBA1C 6.2 02/19/2020 09:32 AM   HGBA1C 6.2 01/22/2019 10:08 AM   GFR 88.08 10/18/2020 01:15 PM   GFR 87.42 02/19/2020 09:32 AM   MICROALBUR 0.3 12/18/2006 09:53 AM    Last diabetic Eye exam:  No results found for: HMDIABEYEEXA  Last diabetic Foot exam:  No results found for: HMDIABFOOTEX   Lab Results  Component Value Date   CHOL 163 02/19/2020   HDL 50.40 02/19/2020   LDLCALC 90 02/19/2020   LDLDIRECT 148.9 09/23/2009   TRIG 111.0 02/19/2020   CHOLHDL 3 02/19/2020    Hepatic Function Latest Ref Rng & Units 10/18/2020 02/19/2020 11/04/2019  Total Protein 6.0 - 8.3 g/dL 7.1 7.0 6.8  Albumin 3.5 - 5.2 g/dL 4.3 4.1 4.4  AST 0 - 37 U/L '25 25 21  ' ALT 0 - 35 U/L '23 20 15  ' Alk Phosphatase 39 - 117 U/L 51 53 73  Total Bilirubin 0.2 - 1.2 mg/dL 1.7(H) 1.3(H) 1.1  Bilirubin, Direct 0.0 - 0.3 mg/dL - - -    Lab Results  Component Value Date/Time   TSH 1.79 10/18/2020 01:15 PM   TSH 2.920 11/04/2019 10:05 AM    CBC Latest Ref Rng & Units 10/18/2020 11/04/2019 01/22/2019  WBC 4.0 - 10.5 K/uL 7.1 8.0 6.3  Hemoglobin 12.0 - 15.0 g/dL 13.6 14.0 13.2  Hematocrit 36.0 - 46.0 % 40.8 43.8 39.7  Platelets 150.0 - 400.0 K/uL 255.0 246 216.0    Lab Results  Component Value Date/Time   VD25OH 75.78 10/18/2020 01:15 PM   VD25OH 51 09/04/2013 09:28 AM   VD25OH 44 01/01/2012 09:29 AM    Clinical ASCVD: No  The 10-year ASCVD risk score Mikey Bussing DC Jr., et al., 2013) is: 15.7%    Values used to calculate the score:     Age: 75 years     Sex: Female     Is Non-Hispanic African American: No     Diabetic: No     Tobacco smoker: No     Systolic Blood Pressure: 621 mmHg     Is BP treated: No     HDL Cholesterol: 50.4 mg/dL     Total Cholesterol: 163 mg/dL    Depression screen [facility] 2/9 02/15/2020 01/16/2019 01/10/2018  Decreased Interest 0 0 0  Down, Depressed, Hopeless 0 1 0  PHQ - 2 Score 0 1 0  Altered sleeping - - 2  Tired, decreased energy - - 0  Change in appetite - - 0  Feeling bad or failure about yourself  - - 0  Trouble concentrating - - 0  Moving slowly or fidgety/restless - - 0  Suicidal thoughts - - 0  PHQ-9 Score - - 2  Difficult doing work/chores - - Not difficult at all  Some recent data might be hidden    Social History   Tobacco Use  Smoking Status Former   Types: Cigarettes   Quit date: 03/28/1999   Years since quitting: 21.6  Smokeless Tobacco Never  Tobacco Comments   smoked 1967-2000, up to 3/4 ppd   BP Readings from Last 3 Encounters:  11/16/20 (!) 144/70  10/24/20 130/74  10/18/20 128/68   Pulse Readings from Last 3 Encounters:  11/16/20 88  10/18/20 72  09/15/20 80   Wt Readings from Last 3 Encounters:  11/16/20 163 lb 6.4 oz (74.1 kg)  10/24/20 165 lb (74.8 kg)  10/18/20 167 lb (75.8 kg)   BMI Readings from Last 3 Encounters:  11/16/20 27.61 kg/m  10/24/20 27.88 kg/m  10/18/20 28.22 kg/m    Assessment/Interventions: Review of patient past medical history, allergies, medications, health status, including review of consultants reports, laboratory and other test data, was performed as part of comprehensive evaluation and provision of chronic care management services.   SDOH:  (Social Determinants of Health) assessments and interventions performed: Yes  SDOH Screenings   Alcohol Screen: Low Risk    Last Alcohol Screening Score (AUDIT): 2  Depression (PHQ2-9): Low Risk    PHQ-2 Score: 0  Financial Resource  Strain: Low Risk    Difficulty of Paying Living Expenses: Not hard at all  Food Insecurity: No Food Insecurity   Worried About Charity fundraiser in the Last Year: Never true   Ran Out of Food in the Last Year: Never true  Housing: Low Risk    Last Housing Risk Score: 0  Physical Activity: Sufficiently Active   Days of Exercise per Week: 5 days   Minutes of Exercise per Session: 30 min  Social Connections: Moderately Integrated   Frequency of Communication with Friends and Family: More than three times a week   Frequency of Social Gatherings with Friends and Family: More than three times a week   Attends Religious Services: More than 4 times per year   Active Member of [employer] or Organizations: Yes   Attends Music therapist: More than 4 times per year   Marital Status: Divorced  Stress: No Stress Concern Present   Feeling of Stress : Not at all  Tobacco Use: Medium Risk   Smoking Tobacco Use: Former   Smokeless Tobacco Use: Never  Transportation Needs: No Data processing manager (Medical): No   Lack of Transportation (Non-Medical): No    CCM Care Plan  Allergies  Allergen Reactions   Arimidex [Anastrozole] Other (See Comments)    Unable to function, loopy, dizzy, and mentally fogginess   Septra Ds [Sulfamethoxazole-Trimethoprim] Hives   Sulfa Antibiotics    Adhesive [Tape] Rash   Augmentin [Amoxicillin-Pot Clavulanate] Diarrhea   Other Rash    Medications Reviewed Today     Reviewed by [name], [facility] (Pharmacist) on 11/17/20 at 1347  Med List Status: <None>   Medication Order Taking? Sig Documenting Provider Last Dose Status Informant  aspirin EC 81 MG tablet [id number] Yes Take 1 tablet (81 mg total) by mouth daily. Nicholas Lose, MD Taking Active   betamethasone valerate ointment (VALISONE) 0.1 % [id number] Yes Place a pea sized amount topically BID for up to 1 week as needed [place],  Francesca Jewett, MD Taking Active   bimatoprost  (LUMIGAN) 0.01 % SOLN [id number] Yes Place 1 drop into both eyes at bedtime. [provider] Taking Active Self  brinzolamide (AZOPT) 1 % ophthalmic suspension [id number] Yes 1 drop 2 (two) times daily. [provider] Taking Active Self  calcium carbonate (TUMS - DOSED IN MG ELEMENTAL CALCIUM) 500 MG chewable tablet [id number] Yes Chew 1 tablet by mouth as needed for indigestion or heartburn (1000 mg).  [provider] Taking Active Self  Carboxymethylcellulose Sodium (REFRESH LIQUIGEL OP) [id number] Yes Apply 1 drop to eye 4 (four) times daily as needed. [provider] Taking Active   Cholecalciferol (VITAMIN D3 PO) [id number] Yes Take 5,000 Int'l Units by mouth daily.  [provider] Taking Active Self  ezetimibe (ZETIA) 10 MG tablet [id number] Yes Take 1 tablet (10 mg total) by mouth daily. Troy Sine, MD Taking Active   famotidine (PEPCID) 10 MG tablet [id number] Yes Take 1 tablet (10 mg total) by mouth daily.  Patient taking differently: Take 10 mg by mouth. Daily (may take twice daily if needed)   Armbruster, Carlota Raspberry, MD Taking Active   ibuprofen (ADVIL) 200 MG tablet [id number] Yes Take 200 mg by mouth every 6 (six) hours as needed. [provider] Taking Active   letrozole [facility]) 2.5 MG tablet [id number] Yes Take 1 tablet (2.5 mg total) by mouth daily. Nicholas Lose, MD Taking Active     Discontinued 07/12/11 0455 (Discontinued by provider)   rosuvastatin (CRESTOR) 40 MG tablet [id number] Yes Take 1 tablet (40 mg total) by mouth daily. [name], MD Taking Active   sodium chloride (MURO 128) 5 % ophthalmic solution [id number] Yes Place 1 drop into both eyes 2 (two) times a day. Before Azopt [provider] Taking Active   Vaginal Lubricant (REPLENS) GEL [id number] Yes Place 1 application vaginally daily. [provider] Taking Active             Patient Active Problem List   Diagnosis Date Noted   Low back  pain 10/18/2020   COVID-19 08/31/2020   Patellofemoral arthritis of right knee 08/25/2020   Left wrist pain 04/19/2020   Left leg pain 04/18/2020   Acute pain of right knee 03/09/2020   Sensorineural hearing loss (SNHL) of both ears 03/20/2019   Deviated nasal septum 03/02/2019   Pulmonary nodule 07/29/2018   Dupuytren's contracture of both hands 07/11/2018   Ganglion cyst of volar aspect of left wrist 06/04/2018   Frozen shoulder 03/18/2018   Epistaxis 02/05/2018   Right rotator cuff tear 10/15/2017   Tinnitus of both ears 01/01/2017   Reaction to internal stress 05/29/2016   Prediabetes 08/10/2015   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[place]) 05/20/2015   Open-angle glaucoma 06/17/2014   Vitamin D deficiency 10/18/2011   HSV infection    Atrophic vaginitis    VARICOSE VEINS, LOWER EXTREMITIES 09/28/2009   GERD 07/14/2009   Hyperlipidemia 12/26/2006   GILBERT'S SYNDROME 08/09/2006   DEGENERATIVE [place] DISEASE 08/09/2006    Immunization History  Administered Date(s) Administered   Fluad Quad(high Dose 65+) 01/22/2019, 02/15/2020   Hepatitis A, Adult 02/17/2016, 07/11/2018, 01/16/2019   Influenza Split 04/18/2011   Influenza, High Dose Seasonal PF 05/26/2013, 01/27/2016, 02/26/2017, 02/05/2018   Influenza, Seasonal, Injecte, Preservative Fre 04/17/2012   PFIZER(Purple Top)SARS-COV-2 Vaccination 06/12/2019, 07/07/2019, 03/19/2020   Pneumococcal Conjugate-13 02/17/2016   Pneumococcal Polysaccharide-23 07/02/2012   Tdap 08/02/2010    Conditions to be addressed/monitored:  Hyperlipidemia, Glaucoma, Breast Cancer, and GERD  There are no care plans that you recently modified to display for this patient.   Medication Assistance: None required.  Patient affirms current coverage meets needs.  Patient's preferred pharmacy is:  [facility], [address] [place] [address] Phone:  [phone] Fax: [place], [place] - [address] [place] [address] Phone: [phone] Fax: [phone]  Pt endorses 100% compliance  Care Plan and Follow Up Patient Decision:  Patient agrees to Care Plan and Follow-up.  Plan: Telephone follow up appointment with care management team member scheduled for:  6 months and The patient has been provided with contact information for the care management team and has been advised to call with any health related questions or concerns.   [name], PharmD Clinical Pharmacist, [place]   Current Chart Prep = 29 minutes  Current Inperson time = 120 minutes

## 2020-11-16 NOTE — Progress Notes (Signed)
GYNECOLOGY  VISIT   HPI: 75 y.o.   Divorced White or Caucasian Not Hispanic or Latino  female   [id number] with No LMP recorded. Patient is postmenopausal.   here for possible stiches poking her. She is having itching on her labia. She had an hsv out break last week.  She is s/p left vulvar biopsy on 10/24/20, several stitches were placed.   She c/o vulvar itching for 3-4 days, no vaginal itching. No vaginal discharge.   GYNECOLOGIC HISTORY: No LMP recorded. Patient is postmenopausal. Contraception: postmenopausal  Menopausal hormone therapy: none         OB History     Gravida  0   Para  0   Term  0   Preterm  0   AB  0   Living  0      SAB  0   IAB  0   Ectopic  0   Multiple  0   Live Births  0              Patient Active Problem List   Diagnosis Date Noted   Low back pain 10/18/2020   COVID-19 08/31/2020   Patellofemoral arthritis of right knee 08/25/2020   Left wrist pain 04/19/2020   Left leg pain 04/18/2020   Acute pain of right knee 03/09/2020   Sensorineural hearing loss (SNHL) of both ears 03/20/2019   Deviated nasal septum 03/02/2019   Pulmonary nodule 07/29/2018   Dupuytren's contracture of both hands 07/11/2018   Ganglion cyst of volar aspect of left wrist 06/04/2018   Frozen shoulder 03/18/2018   Epistaxis 02/05/2018   Right rotator cuff tear 10/15/2017   Tinnitus of both ears 01/01/2017   Reaction to internal stress 05/29/2016   Prediabetes 08/10/2015   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[place]) 05/20/2015   Open-angle glaucoma 06/17/2014   Vitamin D deficiency 10/18/2011   HSV infection    Atrophic vaginitis    VARICOSE VEINS, LOWER EXTREMITIES 09/28/2009   GERD 07/14/2009   Hyperlipidemia 12/26/2006   GILBERT'S SYNDROME 08/09/2006   DEGENERATIVE [place] DISEASE 08/09/2006    Past Medical History:  Diagnosis Date   Anxiety    Atrophic vaginitis    Breast cancer ([place])    Breast cancer of upper-inner  quadrant of left female breast ([place]) 05/20/2015   Breast cyst    x3   DDD (degenerative disc disease)    Diverticulosis    Fasting hyperglycemia    GERD (gastroesophageal reflux disease)    [name] syndrome    Glaucoma    History of hiatal hernia    HSV infection    Hyperlipidemia    Radiation    left breast 50.4 gray   STD (sexually transmitted disease)    HSV    Past Surgical History:  Procedure Laterality Date   APPENDECTOMY  1961   BREAST CYST ASPIRATION     x2 right ,x1 left   COLONOSCOPY  2007   neg   DILATION AND CURETTAGE OF UTERUS  1975   FLEXIBLE SIGMOIDOSCOPY     x2   PELVIC LAPAROSCOPY     RADIOACTIVE SEED GUIDED PARTIAL MASTECTOMY WITH AXILLARY SENTINEL LYMPH NODE BIOPSY Left 05/31/2015   Procedure: LEFT BREAST RADIOACTIVE SEED GUIDED LUMPECTOMY WITH LEFT AXILLARY SENTINEL LYMPH NODE BIOPSY;  Surgeon: [name], MD;  Location: [facility];  Service: General;  Laterality: Left;   RE-EXCISION OF BREAST LUMPECTOMY Left 07/01/2015   Procedure: RE-EXCISION OF LEFT BREAST  INFERIOR MARGIN;  Surgeon: [name], MD;  Location: [place];  Service: General;  Laterality: Left;   UPPER GASTROINTESTINAL ENDOSCOPY     UPPER GI ENDOSCOPY  10/2010   Dr [name] TOOTH EXTRACTION  1978    Current Outpatient Medications  Medication Sig Dispense Refill   aspirin EC 81 MG tablet Take 1 tablet (81 mg total) by mouth daily.     bimatoprost (LUMIGAN) 0.01 % SOLN Place 1 drop into both eyes at bedtime.     brinzolamide (AZOPT) 1 % ophthalmic suspension 1 drop 2 (two) times daily.     calcium carbonate (TUMS - DOSED IN MG ELEMENTAL CALCIUM) 500 MG chewable tablet Chew 1 tablet by mouth as needed for indigestion or heartburn (1000 mg).      Carboxymethylcellulose Sodium (REFRESH LIQUIGEL OP) Apply 1 drop to eye 4 (four) times daily as needed.     Cholecalciferol (VITAMIN D3 PO) Take 5,000 Int'l Units by mouth daily.      ezetimibe  (ZETIA) 10 MG tablet Take 1 tablet (10 mg total) by mouth daily. 30 tablet 5   famotidine (PEPCID) 10 MG tablet Take 1 tablet (10 mg total) by mouth daily. 90 tablet 3   letrozole (FEMARA) 2.5 MG tablet Take 1 tablet (2.5 mg total) by mouth daily. 90 tablet 3   rosuvastatin (CRESTOR) 40 MG tablet Take 1 tablet (40 mg total) by mouth daily. 90 tablet 3   sodium chloride (MURO 128) 5 % ophthalmic solution Place 1 drop into both eyes 2 (two) times a day. Before Azopt     No current facility-administered medications for this visit.     ALLERGIES: Arimidex [anastrozole], Septra ds [sulfamethoxazole-trimethoprim], Sulfa antibiotics, Adhesive [tape], Augmentin [amoxicillin-pot clavulanate], and Other  Family History  Problem Relation Age of Onset   Lung cancer Mother        smoker   Endometriosis Mother    Cancer Mother        Bladder cancer   Stomach cancer Maternal Grandmother    Coronary artery disease Father    Cancer Father 26       intestinal carcinoid tumors of the ileum   Sudden death Sister        58 week old, congenital deformity   Liver cancer Maternal Uncle    Stroke Paternal Grandfather 17   Leukemia Maternal Uncle    Breast cancer Paternal Aunt        great paternal aunt-Age 87's   Colon cancer Paternal Aunt        lived to 53; pat great aunt   Vascular Disease Paternal Grandmother        thoracic aneurysm   Esophageal cancer Neg Hx    Rectal cancer Neg Hx    Pancreatic cancer Neg Hx     Social History   Socioeconomic History   Marital status: Divorced    Spouse name: Not on file   Number of children: 0   Years of education: Not on file   Highest education level: Not on file  Occupational History   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  Tobacco Use   Smoking status: Former    Pack years: 0.00    Types: Cigarettes    Quit date: 03/28/1999    Years since quitting: 21.6   Smokeless tobacco: Never   Tobacco comments:    smoked 1967-2000, up to 3/4  ppd  Vaping Use   Vaping Use: Never used  Substance and  Sexual Activity   Alcohol use: Yes    Alcohol/week: 10.0 standard drinks    Types: 10 Cans of beer per week   Drug use: No   Sexual activity: Not Currently    Birth control/protection: Post-menopausal    Comment: 1st intercourse 75 yo-More than 5 partners  Other Topics Concern   Not on file  Social History Narrative   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      Regular exercise-no   Social Determinants of Health   Financial Resource Strain: Low Risk    Difficulty of Paying Living Expenses: Not hard at all  Food Insecurity: No Food Insecurity   Worried About Charity fundraiser in the Last Year: Never true   Arboriculturist in the Last Year: Never true  Transportation Needs: No Transportation Needs   Lack of Transportation (Medical): No   Lack of Transportation (Non-Medical): No  Physical Activity: Sufficiently Active   Days of Exercise per Week: 5 days   Minutes of Exercise per Session: 30 min  Stress: No Stress Concern Present   Feeling of Stress : Not at all  Social Connections: Moderately Integrated   Frequency of Communication with Friends and Family: More than three times a week   Frequency of Social Gatherings with Friends and Family: More than three times a week   Attends Religious Services: More than 4 times per year   Active Member of [employer] or Organizations: Yes   Attends Music therapist: More than 4 times per year   Marital Status: Divorced  Human resources officer Violence: Not on file    Review of Systems  All other systems reviewed and are negative.  PHYSICAL EXAMINATION:    BP (!) 144/70   Pulse 88   Ht 5' 4.5" (1.638 m)   Wt 163 lb 6.4 oz (74.1 kg)   SpO2 99%   BMI 27.61 kg/m     General appearance: alert, cooperative and appears stated age  Pelvic: External genitalia:  no lesions, mild erythema on the outer labia majora bilaterally. 2 stitches remaining from  her prior biopsy, both removed.               Urethra:  normal appearing urethra with no masses, tenderness or lesions              Bartholins and Skenes: normal                 Vagina: very atrophic appearing vagina with normal color and discharge, no lesions              Cervix:  not seen, patient uncomfortable with minimal opening of an adolescent speculum.                Chaperone was present for exam.  1. Acute vulvitis Suspect dermatitis - WET PREP FOR TRICH, YEAST, CLUE:negative - betamethasone valerate ointment (VALISONE) 0.1 %; Place a pea sized amount topically BID for up to 1 week as needed  Dispense: 15 g; Refill: 0 -Vulvar skin care reviewed, information given  2. Atrophic vaginitis Using repelens vaginal moisturizer as needed

## 2020-11-16 NOTE — Telephone Encounter (Addendum)
10/24/20 vulvar lesion excised.  She states one little stitch still left.Feels it with wiping and washing.   Also, the whole vulvar area is inflamed and itching x 4 days. Itching badly especially at night. Looked with mirror today and it looks really red. No unusual vaginal discharge. Just her usual.  She called to schedule visit but schedules are full today and tomorrow.  (She will be in a meeting from 11am-1pm today. Dr. Hilaria Ota tomorrow at [facility].)

## 2020-11-17 ENCOUNTER — Ambulatory Visit (INDEPENDENT_AMBULATORY_CARE_PROVIDER_SITE_OTHER): Payer: Medicare Other

## 2020-11-17 ENCOUNTER — Encounter: Payer: Self-pay | Admitting: Obstetrics and Gynecology

## 2020-11-17 DIAGNOSIS — R7303 Prediabetes: Secondary | ICD-10-CM

## 2020-11-17 DIAGNOSIS — C50212 Malignant neoplasm of upper-inner quadrant of left female breast: Secondary | ICD-10-CM | POA: Diagnosis not present

## 2020-11-17 DIAGNOSIS — E782 Mixed hyperlipidemia: Secondary | ICD-10-CM | POA: Diagnosis not present

## 2020-11-17 DIAGNOSIS — H4010X4 Unspecified open-angle glaucoma, indeterminate stage: Secondary | ICD-10-CM | POA: Diagnosis not present

## 2020-11-17 DIAGNOSIS — Z170 Estrogen receptor positive status [ER+]: Secondary | ICD-10-CM

## 2020-11-17 DIAGNOSIS — K219 Gastro-esophageal reflux disease without esophagitis: Secondary | ICD-10-CM

## 2020-11-17 NOTE — Progress Notes (Signed)
Chronic Care Management Pharmacy Note  11/17/2020 Name:  GENEVER HENTGES MRN:  [id number] DOB:  March 22, 1946  Summary: - Patient reports that overall she feels that she is doing well, reports that after last appointment with oncology it was decided that she will continue letrozole for an additional 2 years.  Patient was recently to ob/gyn for evaluation of irritation in groin area which has been getting better with steroid ointment.   -Patient's GERD under control with famotidine 69m daily for the most part, last LDL in range at 980mdL - lowest this has been for patient (started zetia prior to most recent result) - patient reports no issues or complaints from current medications    Recommendations/Changes made from today's visit: - No changes to medications at this time, advised for patient to continue to work with diet to help further reduce LDL, encouraged patient to increase exercise and activity to help keep LDL and A1c down and at goal, patient agreeable to plan  Subjective: Lindsey BILYEUs an [age].o. year old female who is a primary patient of Burns, StClaudina LickMD.  The CCM team was consulted for assistance with disease management and care coordination needs.    Engaged with patient face to face for initial visit in response to provider referral for pharmacy case management and/or care coordination services.   Consent to Services:  The patient was given the following information about Chronic Care Management services today, agreed to services, and gave verbal consent: 1. CCM service includes personalized support from designated clinical staff supervised by the primary care provider, including individualized plan of care and coordination with other care providers 2. 24/7 contact phone numbers for assistance for urgent and routine care needs. 3. Service will only be billed when office clinical staff spend 20 minutes or more in a month to coordinate care. 4. Only one practitioner may  furnish and bill the service in a calendar month. 5.The patient may stop CCM services at any time (effective at the end of the month) by phone call to the office staff. 6. The patient will be responsible for cost sharing (co-pay) of up to 20% of the service fee (after annual deductible is met). Patient agreed to services and consent obtained.  Patient Care Team: BuBinnie RailMD as PCP - General (Internal Medicine) FoPhineas RealTiBelinda BlockMD (Inactive) as Consulting Physician (Gynecology) Armbruster, StCarlota RaspberryMD as Consulting Physician (Gastroenterology) DiCalvert CantorMD as Consulting Physician (Ophthalmology) SzDelice BisonaDarnelle Maff[facility] Pharmacist (Pharmacist)  Recent office visits: 08/31/2020 - PCP - televideo visit - COVID positive 08/30/2020 - symptoms started 08/26/2020- feeling better, will continue to quarantine for a few days - discussed 2nd COVID booster shot once completely recovered   03/09/2020 - PCP visit - evaluation of right knee pain prescribed meloxicam and advised to ice knee    Recent consult visits: 11/16/2020 - Dr. JeTalbert Nan OB/GYN - acute vulvitis - prescribed betamethasone 0.1% ointment to use for up to 1 week  10/24/2020 - Dr. JeTalbert Nan OB/GYN - vulvar biopsy performed 10/18/2020 - Dr. SmTamala Julian Sports [facility] - chronic pain of right knee/ low back pain - x-rays completed, patient to follow up with PT - next office visit 6-8 weeks 09/15/2020 - Dr. GuLindi Adie Heme/Onc - follow up of left breast cancer on letrozole- mammogram 06/13/20 - shows no evidence of malignancy - no changes - f/u in 1 year   08/25/2020 - Dr. SmTamala Julian Sports Medicine - chronic pain of  right knee - patellofemoral arthritis - offered injection which patient declined at this time, preferred to follow with PT - possible she could be a candidate for advanced imaging 05/31/2020 - Dr. Tamala Julian - Sports Medicine - evaluation for left wrist pain, x-rays unremarkable - patient feels that pain has improved by 95% - patient was  encouraged to increase activity as she is able - follow up in 3 months  Follows with Dr. Riki Sheer for [facility] visits: None in previous 6 months  Objective:  Lab Results  Component Value Date   CREATININE 0.63 10/18/2020   BUN 12 10/18/2020   GFR 88.08 10/18/2020   GFRNONAA 90 11/04/2019   GFRAA 104 11/04/2019   NA 138 10/18/2020   K 4.1 10/18/2020   CALCIUM 9.5 10/18/2020   CALCIUM 9.4 10/18/2020   CO2 28 10/18/2020   GLUCOSE 119 (H) 10/18/2020    Lab Results  Component Value Date/Time   HGBA1C 6.2 02/19/2020 09:32 AM   HGBA1C 6.2 01/22/2019 10:08 AM   GFR 88.08 10/18/2020 01:15 PM   GFR 87.42 02/19/2020 09:32 AM   MICROALBUR 0.3 12/18/2006 09:53 AM    Last diabetic Eye exam:  No results found for: HMDIABEYEEXA  Last diabetic Foot exam:  No results found for: HMDIABFOOTEX   Lab Results  Component Value Date   CHOL 163 02/19/2020   HDL 50.40 02/19/2020   LDLCALC 90 02/19/2020   LDLDIRECT 148.9 09/23/2009   TRIG 111.0 02/19/2020   CHOLHDL 3 02/19/2020    Hepatic Function Latest Ref Rng & Units 10/18/2020 02/19/2020 11/04/2019  Total Protein 6.0 - 8.3 g/dL 7.1 7.0 6.8  Albumin 3.5 - 5.2 g/dL 4.3 4.1 4.4  AST 0 - 37 U/L _0 ALT 0 - 35 U/L _1 Alk Phosphatase 39 - 117 U/L 51 53 73  Total Bilirubin 0.2 - 1.2 mg/dL 1.7(H) 1.3(H) 1.1  Bilirubin, Direct 0.0 - 0.3 mg/dL - - -    Lab Results  Component Value Date/Time   TSH 1.79 10/18/2020 01:15 PM   TSH 2.920 11/04/2019 10:05 AM    CBC Latest Ref Rng & Units 10/18/2020 11/04/2019 01/22/2019  WBC 4.0 - 10.5 K/uL 7.1 8.0 6.3  Hemoglobin 12.0 - 15.0 g/dL 13.6 14.0 13.2  Hematocrit 36.0 - 46.0 % 40.8 43.8 39.7  Platelets 150.0 - 400.0 K/uL 255.0 246 216.0    Lab Results  Component Value Date/Time   VD25OH 75.78 10/18/2020 01:15 PM   VD25OH 51 09/04/2013 09:28 AM   VD25OH 44 01/01/2012 09:29 AM    Clinical ASCVD: No  The 10-year ASCVD risk score Mikey Bussing DC Jr., et al., 2013) is: 15.7%    Values used to calculate the score:     Age: 75 years     Sex: Female     Is Non-Hispanic African American: No     Diabetic: No     Tobacco smoker: No     Systolic Blood Pressure: 876 mmHg     Is BP treated: No     HDL Cholesterol: 50.4 mg/dL     Total Cholesterol: 163 mg/dL    Depression screen [facility] 2/9 02/15/2020 01/16/2019 01/10/2018  Decreased Interest 0 0 0  Down, Depressed, Hopeless 0 1 0  PHQ - 2 Score 0 1 0  Altered sleeping - - 2  Tired, decreased energy - - 0  Change in appetite - - 0  Feeling bad or failure about yourself  - -  0  Trouble concentrating - - 0  Moving slowly or fidgety/restless - - 0  Suicidal thoughts - - 0  PHQ-9 Score - - 2  Difficult doing work/chores - - Not difficult at all  Some recent data might be hidden    Social History   Tobacco Use  Smoking Status Former   Types: Cigarettes   Quit date: 03/28/1999   Years since quitting: 21.6  Smokeless Tobacco Never  Tobacco Comments   smoked 1967-2000, up to 3/4 ppd   BP Readings from Last 3 Encounters:  11/16/20 (!) 144/70  10/24/20 130/74  10/18/20 128/68   Pulse Readings from Last 3 Encounters:  11/16/20 88  10/18/20 72  09/15/20 80   Wt Readings from Last 3 Encounters:  11/16/20 163 lb 6.4 oz (74.1 kg)  10/24/20 165 lb (74.8 kg)  10/18/20 167 lb (75.8 kg)   BMI Readings from Last 3 Encounters:  11/16/20 27.61 kg/m  10/24/20 27.88 kg/m  10/18/20 28.22 kg/m    Assessment/Interventions: Review of patient past medical history, allergies, medications, health status, including review of consultants reports, laboratory and other test data, was performed as part of comprehensive evaluation and provision of chronic care management services.   SDOH:  (Social Determinants of Health) assessments and interventions performed: Yes  SDOH Screenings   Alcohol Screen: Low Risk    Last Alcohol Screening Score (AUDIT): 2  Depression (PHQ2-9): Low Risk    PHQ-2 Score: 0  Financial Resource  Strain: Low Risk    Difficulty of Paying Living Expenses: Not hard at all  Food Insecurity: No Food Insecurity   Worried About Charity fundraiser in the Last Year: Never true   Ran Out of Food in the Last Year: Never true  Housing: Low Risk    Last Housing Risk Score: 0  Physical Activity: Sufficiently Active   Days of Exercise per Week: 5 days   Minutes of Exercise per Session: 30 min  Social Connections: Moderately Integrated   Frequency of Communication with Friends and Family: More than three times a week   Frequency of Social Gatherings with Friends and Family: More than three times a week   Attends Religious Services: More than 4 times per year   Active Member of [employer] or Organizations: Yes   Attends Music therapist: More than 4 times per year   Marital Status: Divorced  Stress: No Stress Concern Present   Feeling of Stress : Not at all  Tobacco Use: Medium Risk   Smoking Tobacco Use: Former   Smokeless Tobacco Use: Never  Transportation Needs: No Data processing manager (Medical): No   Lack of Transportation (Non-Medical): No    CCM Care Plan  Allergies  Allergen Reactions   Arimidex [Anastrozole] Other (See Comments)    Unable to function, loopy, dizzy, and mentally fogginess   Septra Ds [Sulfamethoxazole-Trimethoprim] Hives   Sulfa Antibiotics    Adhesive [Tape] Rash   Augmentin [Amoxicillin-Pot Clavulanate] Diarrhea   Other Rash    Medications Reviewed Today     Reviewed by [name], [facility] (Pharmacist) on 11/17/20 at 1347  Med List Status: <None>   Medication Order Taking? Sig Documenting Provider Last Dose Status Informant  aspirin EC 81 MG tablet [id number] Yes Take 1 tablet (81 mg total) by mouth daily. Nicholas Lose, MD Taking Active   betamethasone valerate ointment (VALISONE) 0.1 % [id number] Yes Place a pea sized amount topically BID for up to 1 week as  needed Salvadore Dom, MD Taking Active   bimatoprost  (LUMIGAN) 0.01 % SOLN [id number] Yes Place 1 drop into both eyes at bedtime. [provider] Taking Active Self  brinzolamide (AZOPT) 1 % ophthalmic suspension [id number] Yes 1 drop 2 (two) times daily. [provider] Taking Active Self  calcium carbonate (TUMS - DOSED IN MG ELEMENTAL CALCIUM) 500 MG chewable tablet [id number] Yes Chew 1 tablet by mouth as needed for indigestion or heartburn (1000 mg).  [provider] Taking Active Self  Carboxymethylcellulose Sodium (REFRESH LIQUIGEL OP) [id number] Yes Apply 1 drop to eye 4 (four) times daily as needed. [provider] Taking Active   Cholecalciferol (VITAMIN D3 PO) [id number] Yes Take 5,000 Int'l Units by mouth daily.  [provider] Taking Active Self  ezetimibe (ZETIA) 10 MG tablet [id number] Yes Take 1 tablet (10 mg total) by mouth daily. Troy Sine, MD Taking Active   famotidine (PEPCID) 10 MG tablet [id number] Yes Take 1 tablet (10 mg total) by mouth daily.  Patient taking differently: Take 10 mg by mouth. Daily (may take twice daily if needed)   Armbruster, Carlota Raspberry, MD Taking Active   ibuprofen (ADVIL) 200 MG tablet [id number] Yes Take 200 mg by mouth every 6 (six) hours as needed. [provider] Taking Active   letrozole [facility]) 2.5 MG tablet [id number] Yes Take 1 tablet (2.5 mg total) by mouth daily. Nicholas Lose, MD Taking Active     Discontinued 07/12/11 0455 (Discontinued by provider)   rosuvastatin (CRESTOR) 40 MG tablet [id number] Yes Take 1 tablet (40 mg total) by mouth daily. [name], MD Taking Active   sodium chloride (MURO 128) 5 % ophthalmic solution [id number] Yes Place 1 drop into both eyes 2 (two) times a day. Before Azopt [provider] Taking Active   Vaginal Lubricant (REPLENS) GEL [id number] Yes Place 1 application vaginally daily. [provider] Taking Active             Patient Active Problem List   Diagnosis Date Noted   Low back  pain 10/18/2020   COVID-19 08/31/2020   Patellofemoral arthritis of right knee 08/25/2020   Left wrist pain 04/19/2020   Left leg pain 04/18/2020   Acute pain of right knee 03/09/2020   Sensorineural hearing loss (SNHL) of both ears 03/20/2019   Deviated nasal septum 03/02/2019   Pulmonary nodule 07/29/2018   Dupuytren's contracture of both hands 07/11/2018   Ganglion cyst of volar aspect of left wrist 06/04/2018   Frozen shoulder 03/18/2018   Epistaxis 02/05/2018   Right rotator cuff tear 10/15/2017   Tinnitus of both ears 01/01/2017   Reaction to internal stress 05/29/2016   Prediabetes 08/10/2015   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[place]) 05/20/2015   Open-angle glaucoma 06/17/2014   Vitamin D deficiency 10/18/2011   HSV infection    Atrophic vaginitis    VARICOSE VEINS, LOWER EXTREMITIES 09/28/2009   GERD 07/14/2009   Hyperlipidemia 12/26/2006   GILBERT'S SYNDROME 08/09/2006   DEGENERATIVE [place] DISEASE 08/09/2006    Immunization History  Administered Date(s) Administered   Fluad Quad(high Dose 65+) 01/22/2019, 02/15/2020   Hepatitis A, Adult 02/17/2016, 07/11/2018, 01/16/2019   Influenza Split 04/18/2011   Influenza, High Dose Seasonal PF 05/26/2013, 01/27/2016, 02/26/2017, 02/05/2018   Influenza, Seasonal, Injecte, Preservative Fre 04/17/2012   PFIZER(Purple Top)SARS-COV-2 Vaccination 06/12/2019, 07/07/2019, 03/19/2020   Pneumococcal Conjugate-13 02/17/2016   Pneumococcal Polysaccharide-23 07/02/2012   Tdap 08/02/2010    Conditions to be  addressed/monitored:  Hyperlipidemia, Glaucoma, Breast Cancer, and GERD  Care Plan : CCM Care Plan  Updates made by [name], [facility] since 11/17/2020 12:00 AM     Problem: Hyperlipidemia, Glaucoma, Breast Cancer, and GERD   Priority: High  Onset Date: 11/17/2020     Long-Range Goal: Patient-Specific Goal   Start Date: 11/17/2020  Expected End Date: 05/20/2021  This Visit's Progress: On track   Priority: High  Note:   Current Barriers:  Unable to independently monitor therapeutic efficacy  Pharmacist Clinical Goal(s):  Patient will maintain control of LDL and stomach acid as evidenced by next lipid panel use of famotidine and tums  through collaboration with PharmD and provider.   Interventions: 1:1 collaboration with [name], MD regarding development and update of comprehensive plan of care as evidenced by provider attestation and co-signature Inter-disciplinary care team collaboration (see longitudinal plan of care) Comprehensive medication review performed; medication list updated in electronic medical record  Hyperlipidemia: (LDL goal < 100) -Controlled -Last LDL 34m/dL (02/19/2020) -Current treatment: Rosuvastatin 449m- 1 tablet daily  Ezetimibe 1061m 1 tablet daily  Aspirin 7m19m1 tablet daily  -Medications previously tried: n/a  -Current dietary patterns: reports that she does eat red meats/ fried or fatty foods on occasion, but that she has worked to reduce portion sizes, notes that since the beginning of the pandemic that she has lost 12 lbs  -Current exercise habits: working with PT, [facility]  -Educated on Cholesterol goals;  Benefits of statin for ASCVD risk reduction; Importance of limiting foods high in cholesterol; Exercise goal of 150 minutes per week; Strategies to manage statin-induced myalgias; -Counseled on diet and exercise extensively Recommended to continue current medication  Prediabetes (A1c goal <6.5%) -Controlled -Last A1c 6.2% (02/19/2020) -Current medications: N/a - diet controlled  -Medications previously tried: n/a  -Current meal patterns:  Patient reports that she has been working to lose weight, has made changes to portion sizes, through diet and exercise has been able to lose 12lbs since the start of the pandemic  -Current exercise: working with PT, [facility]  -Educated on A1c and blood sugar goals; Complications of diabetes including kidney damage, retinal damage, and cardiovascular disease; Exercise goal of 150 minutes per week; Benefits of weight loss; -Counseled to check feet daily and get yearly eye exams -Counseled on diet and exercise extensively   GERD (Goal: Acid control/ symptom prevention) -Controlled -Current treatment  Famotidine 10mg66m tablet daily, 1 additional tablet daily if needed  Tums - 1 tablet if needed  -Medications previously tried: omeprazole, aciphex, ranitidine  -Counseled on diet and exercise extensively Recommended to continue current medication  Breast Cancer (Goal: Treatment of cancer/ prevention of disease progression) -Controlled -Current treatment  Letrozole 2.5mg -90mtablet daily  -Medications previously tried: anastrozole    -Recommended to continue current medication  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Gluacoma (Goal: Occular Pressure Control) ([facility] -Current treatment  Azopt 1% ophthalmic solution - 1 drop into each eye twice daily  Lumigan 0.01% - 1 drop into each eye at bedtime Mura 128 5% ophthalmic solution - 1 drop into each eye twice daily  Refresh ophthalmic solution - 1 drop into each eye 4 times daily as needed  -Medications previously tried: isopto tears   -Recommended to continue current medication  Atrophic Vaginitis (Goal: prevention of irritation / disease progression) -Controlled -Current  treatment  Betamethasone valerate 0.1% ointment - apply twice daily for up to 1 week Replens gel - 1 application daily  -Medications previously tried: n/a  -Recommended to continue current medication  Health Maintenance -Vaccine gaps: Shingles, Tdap, COVID Booster -Current therapy:  Vitamin D3 - 5000 units daily  Ibuprofen 261m - 1 tablet every 6 hours if needed  -Educated on Cost vs benefit of each product must be carefully weighed by individual  consumer -Patient is satisfied with current therapy and denies issues -Recommended to continue current medication  Patient Goals/Self-Care Activities Patient will:  - take medications as prescribed target a minimum of 150 minutes of moderate intensity exercise weekly engage in dietary modifications by continuing to monitor portion control and try to reduce red meat / fried and fatty food intake   Follow Up Plan: Telephone follow up appointment with care management team member scheduled for: The patient has been provided with contact information for the care management team and has been advised to call with any health related questions or concerns.        Medication Assistance: None required.  Patient affirms current coverage meets needs.  [facility]  [facility] [place] [address] [id number]: [phone]: [address] - [id number]: [phone]: [phone] Pt endorses 100% compliance  Care Plan and Follow Up Patient Decision:  Patient agrees to Care Plan and Follow-up.  Plan: Telephone follow up appointment with care management team member scheduled for:  6 months and The patient has been provided with contact information for the care management team and has been advised to call with any health related questions or concerns.   DTomasa Blase PharmD Clinical Pharmacist, [place]

## 2020-11-17 NOTE — Patient Instructions (Signed)
Visit Information   PATIENT GOALS:   Goals Addressed             This Visit's Progress    Manage My Medicine       Timeframe:  Long-Range Goal Priority:  High Start Date:  11/17/2020                          Expected End Date:  05/20/2021                     Follow Up Date 05/18/2021   - call for medicine refill 2 or 3 days before it runs out - call if I am sick and can't take my medicine - keep a list of all the medicines I take; vitamins and herbals too - use a pillbox to sort medicine    Why is this important?   These steps will help you keep on track with your medicines.         Consent to CCM Services: Lindsey Barrett was given information about Chronic Care Management services today including:  CCM service includes personalized support from designated clinical staff supervised by her physician, including individualized plan of care and coordination with other care providers 24/7 contact phone numbers for assistance for urgent and routine care needs. Service will only be billed when office clinical staff spend 20 minutes or more in a month to coordinate care. Only one practitioner may furnish and bill the service in a calendar month. The patient may stop CCM services at any time (effective at the end of the month) by phone call to the office staff. The patient will be responsible for cost sharing (co-pay) of up to 20% of the service fee (after annual deductible is met).  Patient agreed to services and verbal consent obtained.   Patient verbalizes understanding of instructions provided today and agrees to view in [place].   Telephone follow up appointment with care management team member scheduled for: 6 months  The patient has been provided with contact information for the care management team and has been advised to call with any health related questions or concerns.   [name], PharmD Clinical Pharmacist, [place]   CLINICAL CARE PLAN: Patient  Care Plan: CCM Care Plan     Problem Identified: Hyperlipidemia, Glaucoma, Breast Cancer, and GERD   Priority: High  Onset Date: 11/17/2020     Long-Range Goal: Patient-Specific Goal   Start Date: 11/17/2020  Expected End Date: 05/20/2021  This Visit's Progress: On track  Priority: High  Note:   Current Barriers:  Unable to independently monitor therapeutic efficacy  Pharmacist Clinical Goal(s):  Patient will maintain control of LDL and stomach acid as evidenced by next lipid panel use of famotidine and tums  through collaboration with PharmD and provider.   Interventions: 1:1 collaboration with [name], MD regarding development and update of comprehensive plan of care as evidenced by provider attestation and co-signature Inter-disciplinary care team collaboration (see longitudinal plan of care) Comprehensive medication review performed; medication list updated in electronic medical record  Hyperlipidemia: (LDL goal < 100) -Controlled -Last LDL 39m/dL (02/19/2020) -Current treatment: Rosuvastatin 447m- 1 tablet daily  Ezetimibe 1045m 1 tablet daily  Aspirin 58m15m1 tablet daily  -Medications previously tried: n/a  -Current dietary patterns: reports that she does eat red meats/ fried or fatty foods on occasion, but that she has worked to reduce portion sizes, notes  that since the beginning of the pandemic that she has lost 12 lbs  -Current exercise habits: working with PT, soon will be going to [facility] to continue home PT  -Educated on Cholesterol goals;  Benefits of statin for ASCVD risk reduction; Importance of limiting foods high in cholesterol; Exercise goal of 150 minutes per week; Strategies to manage statin-induced myalgias; -Counseled on diet and exercise extensively Recommended to continue current medication  Prediabetes (A1c goal <6.5%) -Controlled -Last A1c 6.2% (02/19/2020) -Current medications: N/a - diet controlled  -Medications previously tried: n/a   -Current meal patterns:  Patient reports that she has been working to lose weight, has made changes to portion sizes, through diet and exercise has been able to lose 12lbs since the start of the pandemic  -Current exercise: working with PT, soon will be going to [facility] to continue home PT  -Educated on A1c and blood sugar goals; Complications of diabetes including kidney damage, retinal damage, and cardiovascular disease; Exercise goal of 150 minutes per week; Benefits of weight loss; -Counseled to check feet daily and get yearly eye exams -Counseled on diet and exercise extensively   GERD (Goal: Acid control/ symptom prevention) -Controlled -Current treatment  Famotidine 75m - 1 tablet daily, 1 additional tablet daily if needed  Tums - 1 tablet if needed  -Medications previously tried: omeprazole, aciphex, ranitidine  -Counseled on diet and exercise extensively Recommended to continue current medication  Breast Cancer (Goal: Treatment of cancer/ prevention of disease progression) -Controlled -Current treatment  Letrozole 2.551m- 1 tablet daily  -Medications previously tried: anastrozole    -Recommended to continue current medication  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[place] (Goal: Occular Pressure Control) (Managed by [facility]-Controlled -Current treatment  Azopt 1% ophthalmic solution - 1 drop into each eye twice daily  Lumigan 0.01% - 1 drop into each eye at bedtime Mura 128 5% ophthalmic solution - 1 drop into each eye twice daily  Refresh ophthalmic solution - 1 drop into each eye 4 times daily as needed  -Medications previously tried: isopto tears   -Recommended to continue current medication  Atrophic Vaginitis (Goal: prevention of irritation / disease progression) -Controlled -Current treatment  Betamethasone valerate 0.1% ointment - apply twice daily for up to 1 week Replens gel - 1 application daily  -Medications previously tried: n/a  -Recommended to continue current  medication  Health Maintenance -Vaccine gaps: Shingles, Tdap, COVID Booster -Current therapy:  Vitamin D3 - 5000 units daily  Ibuprofen 20058m 1 tablet every 6 hours if needed  -Educated on Cost vs benefit of each product must be carefully weighed by individual consumer -Patient is satisfied with current therapy and denies issues -Recommended to continue current medication  Patient Goals/Self-Care Activities Patient will:  - take medications as prescribed target a minimum of 150 minutes of moderate intensity exercise weekly engage in dietary modifications by continuing to monitor portion control and try to reduce red meat / fried and fatty food intake   Follow Up Plan: Telephone follow up appointment with care management team member scheduled for: The patient has been provided with contact information for the care management team and has been advised to call with any health related questions or concerns.

## 2020-11-22 ENCOUNTER — Other Ambulatory Visit: Payer: Self-pay

## 2020-11-22 ENCOUNTER — Encounter: Payer: Self-pay | Admitting: Physical Therapy

## 2020-11-22 ENCOUNTER — Ambulatory Visit: Payer: Medicare Other | Admitting: Physical Therapy

## 2020-11-22 DIAGNOSIS — M545 Low back pain, unspecified: Secondary | ICD-10-CM | POA: Diagnosis not present

## 2020-11-22 DIAGNOSIS — M25561 Pain in right knee: Secondary | ICD-10-CM

## 2020-11-22 NOTE — Therapy (Signed)
[name] [place], [place], [address] Phone: [phone]   Fax:  [phone]  Physical Therapy Treatment  Patient Details  Name: Lindsey Barrett MRN: [id number] Date of Birth: 1945-09-24 Referring Provider (PT): Creig Hines   Encounter Date: 11/22/2020   PT End of Session - 11/22/20 1532     Visit Number 3    Date for PT Re-Evaluation 01/31/21    Authorization Type [facility]    PT Start Time 9509    PT Stop Time 3267    PT Time Calculation (min) 48 min    Activity Tolerance Patient tolerated treatment well    Behavior During Therapy [facility] for tasks assessed/performed             Past Medical History:  Diagnosis Date   Anxiety    Atrophic vaginitis    Breast cancer ([place])    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[place]) 05/20/2015   Breast cyst    x3   DDD (degenerative disc disease)    Diverticulosis    Fasting hyperglycemia    GERD (gastroesophageal reflux disease)    Lindsey Barrett syndrome    Glaucoma    History of hiatal hernia    HSV infection    Hyperlipidemia    Radiation    left breast 50.4 gray   STD (sexually transmitted disease)    HSV    Past Surgical History:  Procedure Laterality Date   APPENDECTOMY  1961   BREAST CYST ASPIRATION     x2 right ,x1 left   COLONOSCOPY  2007   neg   DILATION AND CURETTAGE OF UTERUS  1975   FLEXIBLE SIGMOIDOSCOPY     x2   PELVIC LAPAROSCOPY     RADIOACTIVE SEED GUIDED PARTIAL MASTECTOMY WITH AXILLARY SENTINEL LYMPH NODE BIOPSY Left 05/31/2015   Procedure: LEFT BREAST RADIOACTIVE SEED GUIDED LUMPECTOMY WITH LEFT AXILLARY SENTINEL LYMPH NODE BIOPSY;  Surgeon: [name], MD;  Location: [place];  Service: General;  Laterality: Left;   RE-EXCISION OF BREAST LUMPECTOMY Left 07/01/2015   Procedure: RE-EXCISION OF LEFT BREAST INFERIOR MARGIN;  Surgeon: [name], MD;  Location: [place];   Service: General;  Laterality: Left;   UPPER GASTROINTESTINAL ENDOSCOPY     UPPER GI ENDOSCOPY  10/2010   Dr [name] TOOTH EXTRACTION  1978    There were no vitals filed for this visit.   Subjective Assessment - 11/22/20 1448     Subjective She reports that she was in the car for over 7 hours yesterday, reports all the sitting has caused some stiffness in the knee and the back and some soreness    Currently in Pain? Yes    Pain Location Back    Pain Descriptors / Indicators Sore;Tightness    Aggravating Factors  riding in the car yesterday                               [facility] Adult PT Treatment/Exercise - 11/22/20 0001       Lumbar Exercises: Stretches   Passive Hamstring Stretch Right;Left;3 reps;20 seconds    Piriformis Stretch Right;Left;4 reps;20 seconds      Lumbar Exercises: Aerobic   Nustep level 5 x 7 minutes      Lumbar Exercises: Machines for Strengthening   Cybex Knee Extension 5# 2x10    Cybex Knee Flexion 20# 2x10  Other Lumbar Machine Exercise seated row 15# lats 20# 2x10, 5# straight arm pulls with cues for core and posture    Other Lumbar Machine Exercise 15# AR press, chest press 5# 2x10      Lumbar Exercises: Supine   Other Supine Lumbar Exercises feet on ball K2C, trunk rotation, small bridges, isometric abs      Lumbar Exercises: Sidelying   Clam Both;20 reps                      PT Short Term Goals - 11/22/20 1535       PT SHORT TERM GOAL #1   Title I with HEP    Status Achieved               PT Long Term Goals - 11/22/20 1534       PT LONG TERM GOAL #1   Title go up and down stairs step over step    Status On-going                   Plan - 11/22/20 1533     Clinical Impression Statement Really started talking more with her about the gym and giving cues verbal and tactile for form and for how to adjust the weight and machines, really needs some cues for the core, tight and painful  piriformis    PT Next Visit Plan work in the gym, work on core stability and [employer] and Agree with Plan of Care Patient             Patient will benefit from skilled therapeutic intervention in order to improve the following deficits and impairments:  Abnormal gait, Decreased range of motion, Difficulty walking, Increased muscle spasms, Pain, Impaired flexibility, Improper body mechanics, Decreased strength, Decreased mobility  Visit Diagnosis: Acute pain of right knee  Acute bilateral low back pain without sciatica     Problem List Patient Active Problem List   Diagnosis Date Noted   Low back pain 10/18/2020   COVID-19 08/31/2020   Patellofemoral arthritis of right knee 08/25/2020   Left wrist pain 04/19/2020   Left leg pain 04/18/2020   Acute pain of right knee 03/09/2020   Sensorineural hearing loss (SNHL) of both ears 03/20/2019   Deviated nasal septum 03/02/2019   Pulmonary nodule 07/29/2018   Dupuytren's contracture of both hands 07/11/2018   Ganglion cyst of volar aspect of left wrist 06/04/2018   Frozen shoulder 03/18/2018   Epistaxis 02/05/2018   Right rotator cuff tear 10/15/2017   Tinnitus of both ears 01/01/2017   Reaction to internal stress 05/29/2016   Prediabetes 08/10/2015   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Lindsey Barrett) 05/20/2015   Open-angle glaucoma 06/17/2014   Vitamin D deficiency 10/18/2011   HSV infection    Atrophic vaginitis    VARICOSE VEINS, LOWER EXTREMITIES 09/28/2009   GERD 07/14/2009   Hyperlipidemia 12/26/2006   GILBERT'S SYNDROME 08/09/2006   DEGENERATIVE [place] DISEASE 08/09/2006    [name], PT 11/22/2020, 3:36 PM  [address]. [place], [place], [address] Phone: [phone]   Fax:  [phone]  Name: Lindsey Barrett MRN: [id number] Date of Birth: 1945-12-15

## 2020-11-23 ENCOUNTER — Encounter: Payer: Self-pay | Admitting: Internal Medicine

## 2020-11-26 ENCOUNTER — Encounter: Payer: Self-pay | Admitting: Internal Medicine

## 2020-11-29 ENCOUNTER — Encounter: Payer: Medicare Other | Admitting: Physical Therapy

## 2020-11-30 NOTE — Progress Notes (Signed)
[facility] [place] Phone: [phone] Subjective:   [name], am serving as a scribe for Dr. Hulan Saas.  This visit occurred during the SARS-CoV-2 public health emergency.  Safety protocols were in place, including screening questions prior to the visit, additional usage of staff PPE, and extensive cleaning of exam room while observing appropriate contact time as indicated for disinfecting solutions.    I'm seeing this patient by the request  of:  [name], MD  CC: Low back and right knee pain follow-up  [id number]  10/18/2020 Low back pain.  Discussed with patient icing regimen and home exercises, discussed which activities to do which wants to avoid.  Do think it is secondary to be multifactorial.  With history of breast cancer we will get x-rays to further evaluate.  Discussed icing regimen.  Follow-up again 6 to 8 weeks.  Any significant arthritic changes.  Patient wants to hold on any injection.  We will get laboratory work-up to rule out anything else that could be contributing.  Discussed icing regimen and home exercises.  Start with physical therapy as well.  Patient will follow up with me again 6 to 8 weeks  Update 12/01/2020 MADISIN SHERFEY is a 75 y.o. female coming in with complaint of LBP and R knee pain. Patient has been doing physical therapy. Patient states that she is working on core strength at PT. Unsure if physical therapy is helping yet. Wears brace when out and about.       Past Medical History:  Diagnosis Date   Anxiety    Atrophic vaginitis    Breast cancer ([place])    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[place]) 05/20/2015   Breast cyst    x3   DDD (degenerative disc disease)    Diverticulosis    Fasting hyperglycemia    GERD (gastroesophageal reflux disease)    [name] syndrome    Glaucoma    History of hiatal hernia    HSV infection     Hyperlipidemia    Radiation    left breast 50.4 gray   STD (sexually transmitted disease)    HSV   Past Surgical History:  Procedure Laterality Date   APPENDECTOMY  1961   BREAST CYST ASPIRATION     x2 right ,x1 left   COLONOSCOPY  2007   neg   DILATION AND CURETTAGE OF UTERUS  1975   FLEXIBLE SIGMOIDOSCOPY     x2   PELVIC LAPAROSCOPY     RADIOACTIVE SEED GUIDED PARTIAL MASTECTOMY WITH AXILLARY SENTINEL LYMPH NODE BIOPSY Left 05/31/2015   Procedure: LEFT BREAST RADIOACTIVE SEED GUIDED LUMPECTOMY WITH LEFT AXILLARY SENTINEL LYMPH NODE BIOPSY;  Surgeon: [name], MD;  Location: [place];  Service: General;  Laterality: Left;   RE-EXCISION OF BREAST LUMPECTOMY Left 07/01/2015   Procedure: RE-EXCISION OF LEFT BREAST INFERIOR MARGIN;  Surgeon: [name], MD;  Location: [place];  Service: General;  Laterality: Left;   UPPER GASTROINTESTINAL ENDOSCOPY     UPPER GI ENDOSCOPY  10/2010   Dr [name] TOOTH EXTRACTION  1978   Social History   Socioeconomic History   Marital status: Divorced    Spouse name: Not on file   Number of children: 0   Years of education: Not on file   Highest education level: Not on file  Occupational History   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  Tobacco Use   Smoking status: Former    Types: Cigarettes    Quit date: 03/28/1999    Years since quitting: 21.6   Smokeless tobacco: Never   Tobacco comments:    smoked 1967-2000, up to 3/4 ppd  Vaping Use   Vaping Use: Never used  Substance and Sexual Activity   Alcohol use: Yes    Alcohol/week: 10.0 standard drinks    Types: 10 Cans of beer per week   Drug use: No   Sexual activity: Not Currently    Birth control/protection: Post-menopausal    Comment: 1st intercourse 75 yo-More than 5 partners  Other Topics Concern   Not on file  Social History Narrative   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      Regular  exercise-no   Social Determinants of Health   Financial Resource Strain: Low Risk    Difficulty of Paying Living Expenses: Not hard at all  Food Insecurity: No Food Insecurity   Worried About Charity fundraiser in the Last Year: Never true   Arboriculturist in the Last Year: Never true  Transportation Needs: No Transportation Needs   Lack of Transportation (Medical): No   Lack of Transportation (Non-Medical): No  Physical Activity: Sufficiently Active   Days of Exercise per Week: 5 days   Minutes of Exercise per Session: 30 min  Stress: No Stress Concern Present   Feeling of Stress : Not at all  Social Connections: Moderately Integrated   Frequency of Communication with Friends and Family: More than three times a week   Frequency of Social Gatherings with Friends and Family: More than three times a week   Attends Religious Services: More than 4 times per year   Active Member of [employer] or Organizations: Yes   Attends Music therapist: More than 4 times per year   Marital Status: Divorced   Allergies  Allergen Reactions   Arimidex [Anastrozole] Other (See Comments)    Unable to function, loopy, dizzy, and mentally fogginess   Septra Ds [Sulfamethoxazole-Trimethoprim] Hives   Sulfa Antibiotics    Adhesive [Tape] Rash   Augmentin [Amoxicillin-Pot Clavulanate] Diarrhea   Other Rash   Family History  Problem Relation Age of Onset   Lung cancer Mother        smoker   Endometriosis Mother    Cancer Mother        Bladder cancer   Stomach cancer Maternal Grandmother    Coronary artery disease Father    Cancer Father 53       intestinal carcinoid tumors of the ileum   Sudden death Sister        72 week old, congenital deformity   Liver cancer Maternal Uncle    Stroke Paternal Grandfather 7   Leukemia Maternal Uncle    Breast cancer Paternal Aunt        great paternal aunt-Age 74's   Colon cancer Paternal Aunt        lived to 31; pat great aunt   Vascular  Disease Paternal Grandmother        thoracic aneurysm   Esophageal cancer Neg Hx    Rectal cancer Neg Hx    Pancreatic cancer Neg Hx      Current Outpatient Medications (Cardiovascular):    ezetimibe (ZETIA) 10 MG tablet, Take 1 tablet (10 mg total) by mouth daily.   rosuvastatin (CRESTOR) 40 MG tablet, Take 1 tablet (40 mg  total) by mouth daily.   Current Outpatient Medications (Analgesics):    aspirin EC 81 MG tablet, Take 1 tablet (81 mg total) by mouth daily.   ibuprofen (ADVIL) 200 MG tablet, Take 200 mg by mouth every 6 (six) hours as needed.   Current Outpatient Medications (Other):    betamethasone valerate ointment (VALISONE) 0.1 %, Place a pea sized amount topically BID for up to 1 week as needed   bimatoprost (LUMIGAN) 0.01 % SOLN, Place 1 drop into both eyes at bedtime.   brinzolamide (AZOPT) 1 % ophthalmic suspension, 1 drop 2 (two) times daily.   calcium carbonate (TUMS - DOSED IN MG ELEMENTAL CALCIUM) 500 MG chewable tablet, Chew 1 tablet by mouth as needed for indigestion or heartburn (1000 mg).    Carboxymethylcellulose Sodium (REFRESH LIQUIGEL OP), Apply 1 drop to eye 4 (four) times daily as needed.   Cholecalciferol (VITAMIN D3 PO), Take 5,000 Int'l Units by mouth daily.    famotidine (PEPCID) 10 MG tablet, Take 1 tablet (10 mg total) by mouth daily. (Patient taking differently: Take 10 mg by mouth. Daily (may take twice daily if needed))   letrozole (FEMARA) 2.5 MG tablet, Take 1 tablet (2.5 mg total) by mouth daily.   sodium chloride (MURO 128) 5 % ophthalmic solution, Place 1 drop into both eyes 2 (two) times a day. Before Azopt   Vaginal Lubricant (REPLENS) GEL, Place 1 application vaginally daily.   Reviewed prior external information including notes and imaging from  primary care provider As well as notes that were available from care everywhere and other healthcare systems.  Past medical history, social, surgical and family history all reviewed in  electronic medical record.  No pertanent information unless stated regarding to the chief complaint.   Review of Systems:  No headache, visual changes, nausea, vomiting, diarrhea, constipation, dizziness, abdominal pain, skin rash, fevers, chills, night sweats, weight loss, swollen lymph nodes, body aches, joint swelling, chest pain, shortness of breath, mood changes. POSITIVE muscle aches  Objective  Blood pressure 116/74, pulse 72, height 5' 4.5" (1.638 m), weight 166 lb (75.3 kg), SpO2 98 %.   General: No apparent distress alert and oriented x3 mood and affect normal, dressed appropriately.  HEENT: Pupils equal, extraocular movements intact  Respiratory: Patient's speak in full sentences and does not appear short of breath  Cardiovascular: No lower extremity edema, non tender, no erythema  Normal gait Right knee exam shows the patient still has some mild lateral tracking of the patella.  Patient is wearing a Tru pull lite brace.  Patient is neurovascularly intact distally.   Impression and Recommendations:     The above documentation has been reviewed and is accurate and complete Lyndal Pulley, DO

## 2020-12-01 ENCOUNTER — Other Ambulatory Visit: Payer: Self-pay

## 2020-12-01 ENCOUNTER — Encounter: Payer: Self-pay | Admitting: Family Medicine

## 2020-12-01 ENCOUNTER — Ambulatory Visit (INDEPENDENT_AMBULATORY_CARE_PROVIDER_SITE_OTHER): Payer: Medicare Other | Admitting: Family Medicine

## 2020-12-01 DIAGNOSIS — M1711 Unilateral primary osteoarthritis, right knee: Secondary | ICD-10-CM | POA: Diagnosis not present

## 2020-12-01 NOTE — Patient Instructions (Signed)
Doing great with knee Wear brace with working out [employer] for 20 minutes after activity [employer] or [employer] See me again in 3-4 months

## 2020-12-01 NOTE — Assessment & Plan Note (Signed)
Patient is doing much better at this time.  No significant change in management.  Do think that patient can transition into doing her own exercises when she is done with physical therapy.  Increase activity slowly.  Follow-up again in 3 to 4 months

## 2020-12-06 ENCOUNTER — Encounter: Payer: Medicare Other | Admitting: Physical Therapy

## 2020-12-07 ENCOUNTER — Ambulatory Visit: Payer: Medicare Other | Admitting: Internal Medicine

## 2020-12-08 NOTE — Progress Notes (Signed)
Subjective:    Patient ID: Lindsey Barrett, female    DOB: 11-21-1945, 75 y.o.   MRN: [id number]  HPI The patient is here for an acute visit.   Tick removal - July 9th on left foot between the 2-3 toes.  She removed the tick.  It was not embedded. It was not engorged.  She later developed redness in that area and she treated it and it went away and then it came back later.  She was unsure if this was related to the tick or something else.  She has had some other areas of dermatitis and was not sure.    Medications and allergies reviewed with patient and updated if appropriate.  Patient Active Problem List   Diagnosis Date Noted   Low back pain 10/18/2020   COVID-19 08/31/2020   Patellofemoral arthritis of right knee 08/25/2020   Left wrist pain 04/19/2020   Left leg pain 04/18/2020   Acute pain of right knee 03/09/2020   Sensorineural hearing loss (SNHL) of both ears 03/20/2019   Deviated nasal septum 03/02/2019   Pulmonary nodule 07/29/2018   Dupuytren's contracture of both hands 07/11/2018   Ganglion cyst of volar aspect of left wrist 06/04/2018   Frozen shoulder 03/18/2018   Epistaxis 02/05/2018   Right rotator cuff tear 10/15/2017   Tinnitus of both ears 01/01/2017   Reaction to internal stress 05/29/2016   Prediabetes 08/10/2015   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[place]) 05/20/2015   Open-angle glaucoma 06/17/2014   Vitamin D deficiency 10/18/2011   HSV infection    Atrophic vaginitis    VARICOSE VEINS, LOWER EXTREMITIES 09/28/2009   GERD 07/14/2009   Hyperlipidemia 12/26/2006   GILBERT'S SYNDROME 08/09/2006   DEGENERATIVE [place] DISEASE 08/09/2006    Current Outpatient Medications on File Prior to Visit  Medication Sig Dispense Refill   aspirin EC 81 MG tablet Take 1 tablet (81 mg total) by mouth daily.     betamethasone valerate ointment (VALISONE) 0.1 % Place a pea sized amount topically BID for up to 1 week as needed 15 g 0    bimatoprost (LUMIGAN) 0.01 % SOLN Place 1 drop into both eyes at bedtime.     brinzolamide (AZOPT) 1 % ophthalmic suspension 1 drop 2 (two) times daily.     calcium carbonate (TUMS - DOSED IN MG ELEMENTAL CALCIUM) 500 MG chewable tablet Chew 1 tablet by mouth as needed for indigestion or heartburn (1000 mg).      Carboxymethylcellulose Sodium (REFRESH LIQUIGEL OP) Apply 1 drop to eye 4 (four) times daily as needed.     Cholecalciferol (VITAMIN D3 PO) Take 5,000 Int'l Units by mouth daily.      ezetimibe (ZETIA) 10 MG tablet Take 1 tablet (10 mg total) by mouth daily. 30 tablet 5   famotidine (PEPCID) 10 MG tablet Take 1 tablet (10 mg total) by mouth daily. (Patient taking differently: Take 10 mg by mouth. Daily (may take twice daily if needed)) 90 tablet 3   ibuprofen (ADVIL) 200 MG tablet Take 200 mg by mouth every 6 (six) hours as needed.     letrozole (FEMARA) 2.5 MG tablet Take 1 tablet (2.5 mg total) by mouth daily. 90 tablet 3   rosuvastatin (CRESTOR) 40 MG tablet Take 1 tablet (40 mg total) by mouth daily. 90 tablet 3   sodium chloride (MURO 128) 5 % ophthalmic solution Place 1 drop into both eyes 2 (two) times a day. Before Azopt  Vaginal Lubricant (REPLENS) GEL Place 1 application vaginally daily.     [DISCONTINUED] metoprolol tartrate (LOPRESSOR) 25 MG tablet Take 1 tablet (25 mg total) by mouth as directed. 1 pill every 8 hours as needed for tachycardia or palpitations. 30 tablet 2   No current facility-administered medications on file prior to visit.    Past Medical History:  Diagnosis Date   Anxiety    Atrophic vaginitis    Breast cancer ([place])    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[place]) 05/20/2015   Breast cyst    x3   DDD (degenerative disc disease)    Diverticulosis    Fasting hyperglycemia    GERD (gastroesophageal reflux disease)    [name] syndrome    Glaucoma    History of hiatal hernia    HSV infection    Hyperlipidemia    Radiation     left breast 50.4 gray   STD (sexually transmitted disease)    HSV    Past Surgical History:  Procedure Laterality Date   APPENDECTOMY  1961   BREAST CYST ASPIRATION     x2 right ,x1 left   COLONOSCOPY  2007   neg   DILATION AND CURETTAGE OF UTERUS  1975   FLEXIBLE SIGMOIDOSCOPY     x2   PELVIC LAPAROSCOPY     RADIOACTIVE SEED GUIDED PARTIAL MASTECTOMY WITH AXILLARY SENTINEL LYMPH NODE BIOPSY Left 05/31/2015   Procedure: LEFT BREAST RADIOACTIVE SEED GUIDED LUMPECTOMY WITH LEFT AXILLARY SENTINEL LYMPH NODE BIOPSY;  Surgeon: [name], MD;  Location: [facility];  Service: General;  Laterality: Left;   RE-EXCISION OF BREAST LUMPECTOMY Left 07/01/2015   Procedure: RE-EXCISION OF LEFT BREAST INFERIOR MARGIN;  Surgeon: [name], MD;  Location: [facility];  Service: General;  Laterality: Left;   UPPER GASTROINTESTINAL ENDOSCOPY     UPPER GI ENDOSCOPY  10/2010   Dr [name] TOOTH EXTRACTION  1978    Social History   Socioeconomic History   Marital status: Divorced    Spouse name: Not on file   Number of children: 0   Years of education: Not on file   Highest education level: Not on file  Occupational History   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  Tobacco Use   Smoking status: Former    Types: Cigarettes    Quit date: 03/28/1999    Years since quitting: 21.7   Smokeless tobacco: Never   Tobacco comments:    smoked 1967-2000, up to 3/4 ppd  Vaping Use   Vaping Use: Never used  Substance and Sexual Activity   Alcohol use: Yes    Alcohol/week: 10.0 standard drinks    Types: 10 Cans of beer per week   Drug use: No   Sexual activity: Not Currently    Birth control/protection: Post-menopausal    Comment: 1st intercourse 75 yo-More than 5 partners  Other Topics Concern   Not on file  Social History Narrative   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      Regular exercise-no   Social  Determinants of Health   Financial Resource Strain: Low Risk    Difficulty of Paying Living Expenses: Not hard at all  Food Insecurity: No Food Insecurity   Worried About Charity fundraiser in the Last Year: Never true   Ran Out of Food in the Last Year: Never true  Transportation Needs: No Transportation Needs  Lack of Transportation (Medical): No   Lack of Transportation (Non-Medical): No  Physical Activity: Sufficiently Active   Days of Exercise per Week: 5 days   Minutes of Exercise per Session: 30 min  Stress: No Stress Concern Present   Feeling of Stress : Not at all  Social Connections: Moderately Integrated   Frequency of Communication with Friends and Family: More than three times a week   Frequency of Social Gatherings with Friends and Family: More than three times a week   Attends Religious Services: More than 4 times per year   Active Member of [employer] or Organizations: Yes   Attends Music therapist: More than 4 times per year   Marital Status: Divorced    Family History  Problem Relation Age of Onset   Lung cancer Mother        smoker   Endometriosis Mother    Cancer Mother        Bladder cancer   Stomach cancer Maternal Grandmother    Coronary artery disease Father    Cancer Father 17       intestinal carcinoid tumors of the ileum   Sudden death Sister        56 week old, congenital deformity   Liver cancer Maternal Uncle    Stroke Paternal Grandfather 42   Leukemia Maternal Uncle    Breast cancer Paternal Aunt        great paternal aunt-Age 64's   Colon cancer Paternal Aunt        lived to 64; pat great aunt   Vascular Disease Paternal Grandmother        thoracic aneurysm   Esophageal cancer Neg Hx    Rectal cancer Neg Hx    Pancreatic cancer Neg Hx     Review of Systems     Objective:   Vitals:   12/09/20 1100  BP: 118/68  Pulse: (!) 56  Temp: 98.3 F (36.8 C)  SpO2: 96%   BP Readings from Last 3 Encounters:  12/09/20  118/68  12/01/20 116/74  11/16/20 (!) 144/70   Wt Readings from Last 3 Encounters:  12/09/20 166 lb (75.3 kg)  12/01/20 166 lb (75.3 kg)  11/16/20 163 lb 6.4 oz (74.1 kg)   Body mass index is 28.05 kg/m.   Physical Exam Constitutional:      General: She is not in acute distress.    Appearance: Normal appearance. She is not ill-appearing.  HENT:     Head: Normocephalic and atraumatic.  Skin:    General: Skin is warm and dry.     Comments: Left foot w/o lesion, erythema and rash  Neurological:     Mental Status: She is alert.           Assessment & Plan:    See Problem List for Assessment and Plan of chronic medical problems.    This visit occurred during the SARS-CoV-2 public health emergency.  Safety protocols were in place, including screening questions prior to the visit, additional usage of staff PPE, and extensive cleaning of exam room while observing appropriate contact time as indicated for disinfecting solutions.

## 2020-12-09 ENCOUNTER — Other Ambulatory Visit: Payer: Self-pay

## 2020-12-09 ENCOUNTER — Ambulatory Visit (INDEPENDENT_AMBULATORY_CARE_PROVIDER_SITE_OTHER): Payer: Medicare Other | Admitting: Internal Medicine

## 2020-12-09 ENCOUNTER — Encounter: Payer: Self-pay | Admitting: Internal Medicine

## 2020-12-09 DIAGNOSIS — L309 Dermatitis, unspecified: Secondary | ICD-10-CM | POA: Diagnosis not present

## 2020-12-09 NOTE — Assessment & Plan Note (Signed)
Acute Sounds like what she had on her foot was dermatitis for unknown reason It was not related to the tick since the tick never bit her Improved with over-the-counter cortisone cream and she can continue to use that as needed.  She does have a stronger steroid cream at home if that does not seem to help Discussed dermatitis and inability to identify what the causes-treat symptomatically

## 2020-12-09 NOTE — Patient Instructions (Addendum)
    Treat your foot rash as needed.  You can use otc cortisone ( mild steroid) or the stronger steroid betamethasone if needed.

## 2020-12-12 ENCOUNTER — Encounter: Payer: Self-pay | Admitting: Physical Therapy

## 2020-12-12 ENCOUNTER — Ambulatory Visit: Payer: Medicare Other | Attending: Family Medicine | Admitting: Physical Therapy

## 2020-12-12 ENCOUNTER — Other Ambulatory Visit: Payer: Self-pay

## 2020-12-12 DIAGNOSIS — M545 Low back pain, unspecified: Secondary | ICD-10-CM | POA: Diagnosis not present

## 2020-12-12 DIAGNOSIS — M25561 Pain in right knee: Secondary | ICD-10-CM | POA: Diagnosis not present

## 2020-12-12 NOTE — Therapy (Signed)
[address]. [place], [place], [address] Phone: [phone]   Fax:  [phone]  Physical Therapy Treatment  Patient Details  Name: Lindsey Barrett MRN: [id number] Date of Birth: Nov 04, 1945 Referring Provider (PT): Creig Hines   Encounter Date: 12/12/2020   PT End of Session - 12/12/20 1612     Visit Number 4    Date for PT Re-Evaluation 01/31/21    Authorization Type [employer]    PT Start Time 1528    PT Stop Time 1612    PT Time Calculation (min) 44 min    Activity Tolerance Patient tolerated treatment well    Behavior During Therapy [facility] for tasks assessed/performed             Past Medical History:  Diagnosis Date   Anxiety    Atrophic vaginitis    Breast cancer ([place])    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[place]) 05/20/2015   Breast cyst    x3   DDD (degenerative disc disease)    Diverticulosis    Fasting hyperglycemia    GERD (gastroesophageal reflux disease)    [name] syndrome    Glaucoma    History of hiatal hernia    HSV infection    Hyperlipidemia    Radiation    left breast 50.4 gray   STD (sexually transmitted disease)    HSV    Past Surgical History:  Procedure Laterality Date   APPENDECTOMY  1961   BREAST CYST ASPIRATION     x2 right ,x1 left   COLONOSCOPY  2007   neg   DILATION AND CURETTAGE OF UTERUS  1975   FLEXIBLE SIGMOIDOSCOPY     x2   PELVIC LAPAROSCOPY     RADIOACTIVE SEED GUIDED PARTIAL MASTECTOMY WITH AXILLARY SENTINEL LYMPH NODE BIOPSY Left 05/31/2015   Procedure: LEFT BREAST RADIOACTIVE SEED GUIDED LUMPECTOMY WITH LEFT AXILLARY SENTINEL LYMPH NODE BIOPSY;  Surgeon: [name], MD;  Location: [place] [facility];  Service: General;  Laterality: Left;   RE-EXCISION OF BREAST LUMPECTOMY Left 07/01/2015   Procedure: RE-EXCISION OF LEFT BREAST INFERIOR MARGIN;  Surgeon: [name], MD;  Location: [place];   Service: General;  Laterality: Left;   UPPER GASTROINTESTINAL ENDOSCOPY     UPPER GI ENDOSCOPY  10/2010   Dr [name] TOOTH EXTRACTION  1978    There were no vitals filed for this visit.   Subjective Assessment - 12/12/20 1534     Subjective Saw MD, he feels she still has some lateral tracking, overall doing better.Wants to work on what she can do on her own    Currently in Pain? No/denies                               [facility] Adult PT Treatment/Exercise - 12/12/20 0001       Lumbar Exercises: Stretches   Passive Hamstring Stretch Right;Left;3 reps;20 seconds    Lower Trunk Rotation 3 reps;20 seconds    Piriformis Stretch Right;Left;4 reps;20 seconds      Lumbar Exercises: Aerobic   Recumbent Bike level 4 x 6 minutes      Lumbar Exercises: Machines for Strengthening   Cybex Knee Extension 5# 2x10 verbal and tactile cues for TKE    Cybex Knee Flexion 25# 2x10    Other Lumbar Machine Exercise seated row 20# lats 20# 2x10, 5# straight arm  pulls with cues for core and posture    Other Lumbar Machine Exercise 15# AR press, chest press 5# 2x10      Lumbar Exercises: Supine   Bridge with [employer] 15 reps    Bridge with clamshell 15 reps    Other Supine Lumbar Exercises feet on ball K2C, trunk rotation, small bridges, isometric abs                      PT Short Term Goals - 11/22/20 1535       PT SHORT TERM GOAL #1   Title I with HEP    Status Achieved               PT Long Term Goals - 12/12/20 1701       PT LONG TERM GOAL #1   Title go up and down stairs step over step    Status Partially Met      PT LONG TERM GOAL #2   Title Patient able to perform ADLS without limitation in ROM    Status Partially Met      PT LONG TERM GOAL #3   Title Patient able to perform ADLs with no pain.    Status Partially Met      PT LONG TERM GOAL #4   Title increase right knee AROM to 0-125 degrees flexion    Status Partially Met       PT LONG TERM GOAL #5   Title independent with advanced gym program    Status On-going                   Plan - 12/12/20 1612     Clinical Impression Statement Patient still with some weakness in the core and the hips, she is tight in the ITB and the piriformis mms.  She feels that she needs direction for how to do at home and what to do at home.  She does have the lateral tracking that needs help with the VMO    PT Next Visit Plan work in the gym, work on core stability and VMO    Consulted and Agree with Plan of Care Patient             Patient will benefit from skilled therapeutic intervention in order to improve the following deficits and impairments:  Abnormal gait, Decreased range of motion, Difficulty walking, Increased muscle spasms, Pain, Impaired flexibility, Improper body mechanics, Decreased strength, Decreased mobility  Visit Diagnosis: Acute pain of right knee  Acute bilateral low back pain without sciatica     Problem List Patient Active Problem List   Diagnosis Date Noted   Dermatitis 12/09/2020   Low back pain 10/18/2020   COVID-19 08/31/2020   Patellofemoral arthritis of right knee 08/25/2020   Left wrist pain 04/19/2020   Left leg pain 04/18/2020   Acute pain of right knee 03/09/2020   Sensorineural hearing loss (SNHL) of both ears 03/20/2019   Deviated nasal septum 03/02/2019   Pulmonary nodule 07/29/2018   Dupuytren's contracture of both hands 07/11/2018   Ganglion cyst of volar aspect of left wrist 06/04/2018   Frozen shoulder 03/18/2018   Epistaxis 02/05/2018   Right rotator cuff tear 10/15/2017   Tinnitus of both ears 01/01/2017   Reaction to internal stress 05/29/2016   Prediabetes 08/10/2015   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[place]) 05/20/2015   Open-angle glaucoma 06/17/2014   Vitamin D deficiency 10/18/2011  HSV infection    Atrophic vaginitis    VARICOSE VEINS, LOWER EXTREMITIES 09/28/2009   GERD  07/14/2009   Hyperlipidemia 12/26/2006   GILBERT'S SYNDROME 08/09/2006   DEGENERATIVE [place] DISEASE 08/09/2006    [name], PT 12/12/2020, 5:08 PM  [address]. [place], [place], [address] Phone: [phone]   Fax:  [phone]  Name: GEANA WALTS MRN: [id number] Date of Birth: 10-25-1945

## 2020-12-13 ENCOUNTER — Other Ambulatory Visit (HOSPITAL_BASED_OUTPATIENT_CLINIC_OR_DEPARTMENT_OTHER): Payer: Self-pay

## 2020-12-13 ENCOUNTER — Ambulatory Visit: Payer: Medicare Other | Attending: Internal Medicine

## 2020-12-13 DIAGNOSIS — Z23 Encounter for immunization: Secondary | ICD-10-CM

## 2020-12-13 MED ORDER — PFIZER-BIONT COVID-19 VAC-TRIS 30 MCG/0.3ML IM SUSP
INTRAMUSCULAR | 0 refills | Status: DC
  Filled 2020-12-13: qty 0.3, 1d supply, fill #0

## 2020-12-13 NOTE — Progress Notes (Signed)
   Covid-19 Vaccination Clinic  Name:  Lindsey Barrett    MRN: [id number] DOB: [birth year]  12/13/2020  [name] was observed post Covid-19 immunization for 15 minutes without incident. She was provided with Vaccine Information Sheet and instruction to access the V-Safe system.   [name] was instructed to call 911 with any severe reactions post vaccine: Difficulty breathing  Swelling of face and throat  A fast heartbeat  A bad rash all over body  Dizziness and weakness   Immunizations Administered     Name Date Dose VIS Date Route   PFIZER Comrnaty(Gray TOP) Covid-19 Vaccine 12/13/2020  2:04 PM 0.3 mL 04/14/2020 Intramuscular   Manufacturer: [place]   Lot: [id number]   NDC: [phone]

## 2020-12-19 ENCOUNTER — Other Ambulatory Visit: Payer: Self-pay

## 2020-12-19 ENCOUNTER — Encounter: Payer: Self-pay | Admitting: Physical Therapy

## 2020-12-19 ENCOUNTER — Ambulatory Visit: Payer: Medicare Other | Admitting: Physical Therapy

## 2020-12-19 DIAGNOSIS — M25561 Pain in right knee: Secondary | ICD-10-CM

## 2020-12-19 DIAGNOSIS — M545 Low back pain, unspecified: Secondary | ICD-10-CM | POA: Diagnosis not present

## 2020-12-19 NOTE — Therapy (Signed)
[address]. [place], [place], [address] Phone: [phone]   Fax:  [phone]  Physical Therapy Treatment  Patient Details  Name: Lindsey Barrett MRN: [id number] Date of Birth: 07-13-1945 Referring Provider (PT): Creig Hines   Encounter Date: 12/19/2020   PT End of Session - 12/19/20 1617     Visit Number 5    Date for PT Re-Evaluation 01/31/21    Authorization Type [employer]    PT Start Time 1525    PT Stop Time 1027    PT Time Calculation (min) 49 min    Activity Tolerance Patient tolerated treatment well    Behavior During Therapy [facility] for tasks assessed/performed             Past Medical History:  Diagnosis Date   Anxiety    Atrophic vaginitis    Breast cancer ([place])    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[place]) 05/20/2015   Breast cyst    x3   DDD (degenerative disc disease)    Diverticulosis    Fasting hyperglycemia    GERD (gastroesophageal reflux disease)    [name] syndrome    Glaucoma    History of hiatal hernia    HSV infection    Hyperlipidemia    Radiation    left breast 50.4 gray   STD (sexually transmitted disease)    HSV    Past Surgical History:  Procedure Laterality Date   APPENDECTOMY  1961   BREAST CYST ASPIRATION     x2 right ,x1 left   COLONOSCOPY  2007   neg   DILATION AND CURETTAGE OF UTERUS  1975   FLEXIBLE SIGMOIDOSCOPY     x2   PELVIC LAPAROSCOPY     RADIOACTIVE SEED GUIDED PARTIAL MASTECTOMY WITH AXILLARY SENTINEL LYMPH NODE BIOPSY Left 05/31/2015   Procedure: LEFT BREAST RADIOACTIVE SEED GUIDED LUMPECTOMY WITH LEFT AXILLARY SENTINEL LYMPH NODE BIOPSY;  Surgeon: [name], MD;  Location: [place];  Service: General;  Laterality: Left;   RE-EXCISION OF BREAST LUMPECTOMY Left 07/01/2015   Procedure: RE-EXCISION OF LEFT BREAST INFERIOR MARGIN;  Surgeon: [name], MD;  Location: [place];   Service: General;  Laterality: Left;   UPPER GASTROINTESTINAL ENDOSCOPY     UPPER GI ENDOSCOPY  10/2010   Dr [name] TOOTH EXTRACTION  1978    There were no vitals filed for this visit.   Subjective Assessment - 12/19/20 1545     Subjective Reports that she had a fall onto her knee, "tripped over a cord"  Still have not done much exercise    Currently in Pain? No/denies                               [facility] Adult PT Treatment/Exercise - 12/19/20 0001       Ambulation/Gait   Gait Comments 2 laps around the back building brisk pace      Lumbar Exercises: Stretches   Passive Hamstring Stretch Right;Left;3 reps;20 seconds    Lower Trunk Rotation 3 reps;20 seconds    Piriformis Stretch Right;Left;4 reps;20 seconds      Lumbar Exercises: Aerobic   Nustep level 5 x 7 minutes      Lumbar Exercises: Machines for Strengthening   Cybex Knee Extension 10# 2x10 verbal and tactile cues for TKE    Cybex Knee Flexion 25# 2x10  Other Lumbar Machine Exercise seated row 20# lats 20# 2x10, 5# straight arm pulls with cues for core and posture      Lumbar Exercises: Supine   Bridge with [employer] 15 reps    Bridge with clamshell 15 reps    Other Supine Lumbar Exercises feet on ball K2C, trunk rotation, small bridges, isometric abs                      PT Short Term Goals - 11/22/20 1535       PT SHORT TERM GOAL #1   Title I with HEP    Status Achieved               PT Long Term Goals - 12/19/20 1659       PT LONG TERM GOAL #1   Title go up and down stairs step over step    Status Partially Met      PT LONG TERM GOAL #2   Title Patient able to perform ADLS without limitation in ROM    Status Partially Met      PT LONG TERM GOAL #3   Title Patient able to perform ADLs with no pain.    Status Partially Met                   Plan - 12/19/20 1618     Clinical Impression Statement Patient did great on the walking, mild  short of breath.  Tight piriformis.  She is getting stronger with less pain in the knee with the LE exercises, some cues for form and to get better TKE.    PT Next Visit Plan work in the gym, work on core stability and [employer] and Agree with Plan of Care Patient             Patient will benefit from skilled therapeutic intervention in order to improve the following deficits and impairments:  Abnormal gait, Decreased range of motion, Difficulty walking, Increased muscle spasms, Pain, Impaired flexibility, Improper body mechanics, Decreased strength, Decreased mobility  Visit Diagnosis: Acute pain of right knee  Acute bilateral low back pain without sciatica     Problem List Patient Active Problem List   Diagnosis Date Noted   Dermatitis 12/09/2020   Low back pain 10/18/2020   COVID-19 08/31/2020   Patellofemoral arthritis of right knee 08/25/2020   Left wrist pain 04/19/2020   Left leg pain 04/18/2020   Acute pain of right knee 03/09/2020   Sensorineural hearing loss (SNHL) of both ears 03/20/2019   Deviated nasal septum 03/02/2019   Pulmonary nodule 07/29/2018   Dupuytren's contracture of both hands 07/11/2018   Ganglion cyst of volar aspect of left wrist 06/04/2018   Frozen shoulder 03/18/2018   Epistaxis 02/05/2018   Right rotator cuff tear 10/15/2017   Tinnitus of both ears 01/01/2017   Reaction to internal stress 05/29/2016   Prediabetes 08/10/2015   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[place]) 05/20/2015   Open-angle glaucoma 06/17/2014   Vitamin D deficiency 10/18/2011   HSV infection    Atrophic vaginitis    VARICOSE VEINS, LOWER EXTREMITIES 09/28/2009   GERD 07/14/2009   Hyperlipidemia 12/26/2006   GILBERT'S SYNDROME 08/09/2006   DEGENERATIVE [place] DISEASE 08/09/2006    [name], PT 12/19/2020, 5:00 PM  [address]. [place], [place], [address] Phone:  [phone]   Fax:  [phone]  Name: Lindsey Barrett MRN: [id number] Date of Birth: 11/15/1945

## 2020-12-26 ENCOUNTER — Encounter: Payer: Self-pay | Admitting: Physical Therapy

## 2020-12-26 ENCOUNTER — Other Ambulatory Visit: Payer: Self-pay

## 2020-12-26 ENCOUNTER — Ambulatory Visit: Payer: Medicare Other | Admitting: Physical Therapy

## 2020-12-26 DIAGNOSIS — M545 Low back pain, unspecified: Secondary | ICD-10-CM | POA: Diagnosis not present

## 2020-12-26 DIAGNOSIS — M25561 Pain in right knee: Secondary | ICD-10-CM | POA: Diagnosis not present

## 2020-12-26 NOTE — Therapy (Signed)
[place] [place], [place], [address] Phone: [phone]   Fax:  [phone]  Physical Therapy Treatment  Patient Details  Name: Lindsey Barrett MRN: [id number] Date of Birth: 08/25/1945 Referring Provider (PT): Creig Hines   Encounter Date: 12/26/2020   PT End of Session - 12/26/20 1616     Visit Number 6    Date for PT Re-Evaluation 01/31/21    Authorization Type [employer]    PT Start Time 1528    PT Stop Time 1613    PT Time Calculation (min) 45 min    Activity Tolerance Patient tolerated treatment well    Behavior During Therapy [facility] for tasks assessed/performed             Past Medical History:  Diagnosis Date   Anxiety    Atrophic vaginitis    Breast cancer ([place])    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[place]) 05/20/2015   Breast cyst    x3   DDD (degenerative disc disease)    Diverticulosis    Fasting hyperglycemia    GERD (gastroesophageal reflux disease)    [name] syndrome    Glaucoma    History of hiatal hernia    HSV infection    Hyperlipidemia    Radiation    left breast 50.4 gray   STD (sexually transmitted disease)    HSV    Past Surgical History:  Procedure Laterality Date   APPENDECTOMY  1961   BREAST CYST ASPIRATION     x2 right ,x1 left   COLONOSCOPY  2007   neg   DILATION AND CURETTAGE OF UTERUS  1975   FLEXIBLE SIGMOIDOSCOPY     x2   PELVIC LAPAROSCOPY     RADIOACTIVE SEED GUIDED PARTIAL MASTECTOMY WITH AXILLARY SENTINEL LYMPH NODE BIOPSY Left 05/31/2015   Procedure: LEFT BREAST RADIOACTIVE SEED GUIDED LUMPECTOMY WITH LEFT AXILLARY SENTINEL LYMPH NODE BIOPSY;  Surgeon: [name], MD;  Location: [facility];  Service: General;  Laterality: Left;   RE-EXCISION OF BREAST LUMPECTOMY Left 07/01/2015   Procedure: RE-EXCISION OF LEFT BREAST INFERIOR MARGIN;  Surgeon: [name], MD;  Location: [facility];   Service: General;  Laterality: Left;   UPPER GASTROINTESTINAL ENDOSCOPY     UPPER GI ENDOSCOPY  10/2010   Dr [name] TOOTH EXTRACTION  1978    There were no vitals filed for this visit.   Subjective Assessment - 12/26/20 1538     Subjective Just felt off over the weekend.    Currently in Pain? Yes    Pain Score 1     Pain Location Back    Pain Orientation Lower    Pain Descriptors / Indicators Tightness                               [facility] Adult PT Treatment/Exercise - 12/26/20 0001       Lumbar Exercises: Stretches   Passive Hamstring Stretch Right;Left;3 reps;20 seconds    Lower Trunk Rotation 3 reps;20 seconds    Piriformis Stretch Right;Left;4 reps;20 seconds      Lumbar Exercises: Aerobic   Recumbent Bike level 4 x 6 minutes    Nustep level 5 x 5 minutes      Lumbar Exercises: Machines for Strengthening   Cybex Knee Extension 10# 2x10    Cybex Knee Flexion 25# 2x10  Leg Press 20# x10, 30# x10    Other Lumbar Machine Exercise seated row 20# lats 20# 2x10, 10# straight arm pulls with cues for core and posture    Other Lumbar Machine Exercise 40# resisted gait all directions                      PT Short Term Goals - 11/22/20 1535       PT SHORT TERM GOAL #1   Title I with HEP    Status Achieved               PT Long Term Goals - 12/26/20 1731       PT LONG TERM GOAL #1   Title go up and down stairs step over step    Status Partially Met      PT LONG TERM GOAL #2   Title Patient able to perform ADLS without limitation in ROM    Status Achieved      PT LONG TERM GOAL #3   Title Patient able to perform ADLs with no pain.    Status Partially Met      PT LONG TERM GOAL #4   Title increase right knee AROM to 0-125 degrees flexion    Status Partially Met                   Plan - 12/26/20 1617     Clinical Impression Statement Patient doing very well, needs HEP that is very advanced, she reports  that she does not feel like going to the gym now with covid and needs to be able to replicate what we are doing at home.  Struggled with the addition of the resisted gait but then really seemed to do better with it after a few reps    PT Next Visit Plan work on what she can do at home for the [employer] and Agree with Plan of Care Patient             Patient will benefit from skilled therapeutic intervention in order to improve the following deficits and impairments:  Abnormal gait, Decreased range of motion, Difficulty walking, Increased muscle spasms, Pain, Impaired flexibility, Improper body mechanics, Decreased strength, Decreased mobility  Visit Diagnosis: Acute pain of right knee  Acute bilateral low back pain without sciatica     Problem List Patient Active Problem List   Diagnosis Date Noted   Dermatitis 12/09/2020   Low back pain 10/18/2020   COVID-19 08/31/2020   Patellofemoral arthritis of right knee 08/25/2020   Left wrist pain 04/19/2020   Left leg pain 04/18/2020   Acute pain of right knee 03/09/2020   Sensorineural hearing loss (SNHL) of both ears 03/20/2019   Deviated nasal septum 03/02/2019   Pulmonary nodule 07/29/2018   Dupuytren's contracture of both hands 07/11/2018   Ganglion cyst of volar aspect of left wrist 06/04/2018   Frozen shoulder 03/18/2018   Epistaxis 02/05/2018   Right rotator cuff tear 10/15/2017   Tinnitus of both ears 01/01/2017   Reaction to internal stress 05/29/2016   Prediabetes 08/10/2015   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[place]) 05/20/2015   Open-angle glaucoma 06/17/2014   Vitamin D deficiency 10/18/2011   HSV infection    Atrophic vaginitis    VARICOSE VEINS, LOWER EXTREMITIES 09/28/2009   GERD 07/14/2009   Hyperlipidemia 12/26/2006   GILBERT'S SYNDROME 08/09/2006   DEGENERATIVE [place] DISEASE 08/09/2006  [name], PT 12/26/2020, 5:32 PM  [place]. [place], [place], [address] Phone: [phone]   Fax:  [phone]  Name: ITZA MANIACI MRN: [id number] Date of Birth: 12/18/1945

## 2021-01-02 ENCOUNTER — Ambulatory Visit: Payer: Medicare Other | Admitting: Physical Therapy

## 2021-01-02 ENCOUNTER — Encounter: Payer: Self-pay | Admitting: Physical Therapy

## 2021-01-02 ENCOUNTER — Other Ambulatory Visit: Payer: Self-pay

## 2021-01-02 DIAGNOSIS — M25561 Pain in right knee: Secondary | ICD-10-CM

## 2021-01-02 DIAGNOSIS — M545 Low back pain, unspecified: Secondary | ICD-10-CM

## 2021-01-02 NOTE — Patient Instructions (Signed)
Access Code: [email or web address] URL: [email or web address] Date: 01/02/2021 Prepared by: Lum Babe  Exercises Scapular Retraction with Resistance - 1 x daily - 7 x weekly - 3 sets - 10 reps - 3 hold Standing Shoulder External Rotation with Resistance - 1 x daily - 7 x weekly - 3 sets - 10 reps - 3 hold Single Arm Shoulder Extension with Anchored Resistance - 1 x daily - 7 x weekly - 3 sets - 10 reps - 3 hold Standing Repeated Hip Abduction with Resistance - 1 x daily - 7 x weekly - 3 sets - 10 reps - 3 hold Standing Hip Extension with Anchored Resistance - 1 x daily - 7 x weekly - 3 sets - 10 reps - 3 hold Supine Bridge with Resistance Band - 1 x daily - 7 x weekly - 3 sets - 10 reps - 3 hold Supine Bridge with Mini Swiss Ball Between Knees - 1 x daily - 7 x weekly - 3 sets - 10 reps - 3 hold Doorway Pec Stretch at 90 Degrees Abduction - 1 x daily - 7 x weekly - 3 sets - 5 reps - 10 hold Standing Median Nerve Glide - 1 x daily - 7 x weekly - 3 sets - 5 reps - 10 hold Standing Ulnar Nerve Glide - 1 x daily - 7 x weekly - 3 sets - 5 reps - 10 hold

## 2021-01-02 NOTE — Therapy (Signed)
[place]. [place], [place], [address] Phone: [phone]   Fax:  [phone]  Physical Therapy Treatment  Patient Details  Name: Lindsey Barrett MRN: [id number] Date of Birth: 05-Feb-1946 Referring Provider (PT): Creig Hines   Encounter Date: 01/02/2021   PT End of Session - 01/02/21 1531     Visit Number 7    Date for PT Re-Evaluation 01/31/21    Authorization Type [employer]    PT Start Time 1444    PT Stop Time [id number]    PT Time Calculation (min) 47 min    Activity Tolerance Patient tolerated treatment well    Behavior During Therapy [facility] for tasks assessed/performed             Past Medical History:  Diagnosis Date   Anxiety    Atrophic vaginitis    Breast cancer ([place])    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[place]) 05/20/2015   Breast cyst    x3   DDD (degenerative disc disease)    Diverticulosis    Fasting hyperglycemia    GERD (gastroesophageal reflux disease)    [name] syndrome    Glaucoma    History of hiatal hernia    HSV infection    Hyperlipidemia    Radiation    left breast 50.4 gray   STD (sexually transmitted disease)    HSV    Past Surgical History:  Procedure Laterality Date   APPENDECTOMY  1961   BREAST CYST ASPIRATION     x2 right ,x1 left   COLONOSCOPY  2007   neg   DILATION AND CURETTAGE OF UTERUS  1975   FLEXIBLE SIGMOIDOSCOPY     x2   PELVIC LAPAROSCOPY     RADIOACTIVE SEED GUIDED PARTIAL MASTECTOMY WITH AXILLARY SENTINEL LYMPH NODE BIOPSY Left 05/31/2015   Procedure: LEFT BREAST RADIOACTIVE SEED GUIDED LUMPECTOMY WITH LEFT AXILLARY SENTINEL LYMPH NODE BIOPSY;  Surgeon: [name], MD;  Location: [facility];  Service: General;  Laterality: Left;   RE-EXCISION OF BREAST LUMPECTOMY Left 07/01/2015   Procedure: RE-EXCISION OF LEFT BREAST INFERIOR MARGIN;  Surgeon: [name], MD;  Location: [facility];   Service: General;  Laterality: Left;   UPPER GASTROINTESTINAL ENDOSCOPY     UPPER GI ENDOSCOPY  10/2010   Dr [name] TOOTH EXTRACTION  1978    There were no vitals filed for this visit.   Subjective Assessment - 01/02/21 1449     Subjective I used some hand held 2# weights and I am having some anterior shoulder pain    Currently in Pain? Yes    Pain Score 3     Pain Location Shoulder    Pain Orientation Right;Anterior    Aggravating Factors  using 2# wights                               [facility] Adult PT Treatment/Exercise - 01/02/21 0001       Lumbar Exercises: Aerobic   Recumbent Bike level 4 x 6 minutes    Nustep level 5 x 5 minutes      Lumbar Exercises: Machines for Strengthening   Cybex Knee Extension 10# 2x10    Cybex Knee Flexion 25# 2x10    Other Lumbar Machine Exercise seated row 20# lats 20# 2x10, 10# straight arm pulls with cues for core and posture  Other Lumbar Machine Exercise 40# resisted gait all directions                      PT Short Term Goals - 11/22/20 1535       PT SHORT TERM GOAL #1   Title I with HEP    Status Achieved               PT Long Term Goals - 01/02/21 1533       PT LONG TERM GOAL #1   Title go up and down stairs step over step    Status Achieved                   Plan - 01/02/21 1532     Clinical Impression Statement Patient with some c/o tightness in the pecs and the UE, gave advanced HEP for her that included UE and neural tension stretches.  She feels good with some guidance today, but reports that she will need to try    PT Next Visit Plan she is to try the HEP and we will assess    Consulted and Agree with Plan of Care Patient             Patient will benefit from skilled therapeutic intervention in order to improve the following deficits and impairments:  Abnormal gait, Decreased range of motion, Difficulty walking, Increased muscle spasms, Pain, Impaired  flexibility, Improper body mechanics, Decreased strength, Decreased mobility  Visit Diagnosis: Acute pain of right knee  Acute bilateral low back pain without sciatica     Problem List Patient Active Problem List   Diagnosis Date Noted   Dermatitis 12/09/2020   Low back pain 10/18/2020   COVID-19 08/31/2020   Patellofemoral arthritis of right knee 08/25/2020   Left wrist pain 04/19/2020   Left leg pain 04/18/2020   Acute pain of right knee 03/09/2020   Sensorineural hearing loss (SNHL) of both ears 03/20/2019   Deviated nasal septum 03/02/2019   Pulmonary nodule 07/29/2018   Dupuytren's contracture of both hands 07/11/2018   Ganglion cyst of volar aspect of left wrist 06/04/2018   Frozen shoulder 03/18/2018   Epistaxis 02/05/2018   Right rotator cuff tear 10/15/2017   Tinnitus of both ears 01/01/2017   Reaction to internal stress 05/29/2016   Prediabetes 08/10/2015   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[place]) 05/20/2015   Open-angle glaucoma 06/17/2014   Vitamin D deficiency 10/18/2011   HSV infection    Atrophic vaginitis    VARICOSE VEINS, LOWER EXTREMITIES 09/28/2009   GERD 07/14/2009   Hyperlipidemia 12/26/2006   GILBERT'S SYNDROME 08/09/2006   DEGENERATIVE DISC DISEASE 08/09/2006    [name], PT 01/02/2021, 3:41 PM  [name], [place], [address] Phone: [phone]   Fax:  [phone]  Name: Lindsey Barrett MRN: [id number] Date of Birth: [birth year]

## 2021-01-03 NOTE — Progress Notes (Signed)
75 y.o. [place] Divorced White or Caucasian Not Hispanic or Latino female here for breast & pelvic exam.  No vaginal bleeding.    She had a vulvar biopsy of a vulvar lump that returned with inflammation and abscess in 6/22.  H/O left breast cancer, on letrozol  H/O HSV, gets an outbreak with stress. Doesn't want medication.   She has vaginal atrophy and dryness, replens helps.   No LMP recorded. Patient is postmenopausal.          Sexually active: No.  The current method of family planning is none.    Exercising: Yes.     Smoker:  no  Health Maintenance: Pap:  12-12-17 neg History of abnormal Pap:  no MMG:  06-13-20 category c density birads 2:neg (hx of left breast cancer) BMD:   02-16-20, normal. Colonoscopy: 02-13-16 TDaP:  2012 Gardasil: n/a   reports that she quit smoking about 21 years ago. Her smoking use included cigarettes. She has never used smokeless tobacco. She reports current alcohol use of about 10.0 standard drinks per week. She reports that she does not use drugs.  Past Medical History:  Diagnosis Date   Anxiety    Atrophic vaginitis    Breast cancer ([place])    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[place]) 05/20/2015   Breast cyst    x3   DDD (degenerative disc disease)    Diverticulosis    Fasting hyperglycemia    GERD (gastroesophageal reflux disease)    [name] syndrome    Glaucoma    History of hiatal hernia    HSV infection    Hyperlipidemia    Radiation    left breast 50.4 gray   STD (sexually transmitted disease)    HSV    Past Surgical History:  Procedure Laterality Date   APPENDECTOMY  1961   BREAST CYST ASPIRATION     x2 right ,x1 left   COLONOSCOPY  2007   neg   DILATION AND CURETTAGE OF UTERUS  1975   FLEXIBLE SIGMOIDOSCOPY     x2   PELVIC LAPAROSCOPY     RADIOACTIVE SEED GUIDED PARTIAL MASTECTOMY WITH AXILLARY SENTINEL LYMPH NODE BIOPSY Left 05/31/2015   Procedure: LEFT BREAST RADIOACTIVE SEED GUIDED LUMPECTOMY  WITH LEFT AXILLARY SENTINEL LYMPH NODE BIOPSY;  Surgeon: [name], MD;  Location: [facility];  Service: General;  Laterality: Left;   RE-EXCISION OF BREAST LUMPECTOMY Left 07/01/2015   Procedure: RE-EXCISION OF LEFT BREAST INFERIOR MARGIN;  Surgeon: [name], MD;  Location: [facility];  Service: General;  Laterality: Left;   UPPER GASTROINTESTINAL ENDOSCOPY     UPPER GI ENDOSCOPY  10/2010   Dr [name] TOOTH EXTRACTION  1978    Current Outpatient Medications  Medication Sig Dispense Refill   aspirin EC 81 MG tablet Take 1 tablet (81 mg total) by mouth daily.     bimatoprost (LUMIGAN) 0.01 % SOLN Place 1 drop into both eyes at bedtime.     brinzolamide (AZOPT) 1 % ophthalmic suspension 1 drop 2 (two) times daily.     calcium carbonate (TUMS - DOSED IN MG ELEMENTAL CALCIUM) 500 MG chewable tablet Chew 1 tablet by mouth as needed for indigestion or heartburn (1000 mg).      Carboxymethylcellulose Sodium (REFRESH LIQUIGEL OP) Apply 1 drop to eye 4 (four) times daily as needed.     Cholecalciferol (VITAMIN D3 PO) Take 5,000 Int'l Units by mouth daily.  COVID-19 mRNA Vac-TriS, Pfizer, (PFIZER-BIONT COVID-19 VAC-TRIS) SUSP injection Inject into the muscle. 0.3 mL 0   ezetimibe (ZETIA) 10 MG tablet Take 1 tablet (10 mg total) by mouth daily. 30 tablet 5   famotidine (PEPCID) 10 MG tablet Take 1 tablet (10 mg total) by mouth daily. (Patient taking differently: Take 10 mg by mouth. Daily (may take twice daily if needed)) 90 tablet 3   ibuprofen (ADVIL) 200 MG tablet Take 200 mg by mouth every 6 (six) hours as needed.     letrozole (FEMARA) 2.5 MG tablet Take 1 tablet (2.5 mg total) by mouth daily. 90 tablet 3   rosuvastatin (CRESTOR) 40 MG tablet Take 1 tablet (40 mg total) by mouth daily. 90 tablet 3   sodium chloride (MURO 128) 5 % ophthalmic solution Place 1 drop into both eyes 2 (two) times a day. Before Azopt     Vaginal Lubricant (REPLENS)  GEL Place 1 application vaginally daily.     betamethasone valerate ointment (VALISONE) 0.1 % Place a pea sized amount topically BID for up to 1 week as needed (Patient not taking: Reported on 01/06/2021) 15 g 0   No current facility-administered medications for this visit.    Family History  Problem Relation Age of Onset   Lung cancer Mother        smoker   Endometriosis Mother    Cancer Mother        Bladder cancer   Stomach cancer Maternal Grandmother    Coronary artery disease Father    Cancer Father 39       intestinal carcinoid tumors of the ileum   Sudden death Sister        3 week old, congenital deformity   Liver cancer Maternal Uncle    Stroke Paternal Grandfather 22   Leukemia Maternal Uncle    Breast cancer Paternal Aunt        great paternal aunt-Age 46's   Colon cancer Paternal Aunt        lived to 67; pat great aunt   Vascular Disease Paternal Grandmother        thoracic aneurysm   Esophageal cancer Neg Hx    Rectal cancer Neg Hx    Pancreatic cancer Neg Hx     Review of Systems  All other systems reviewed and are negative.  Exam:   BP 118/76   Ht '5\' 4"'$  (1.626 m)   Wt 166 lb (75.3 kg)   BMI 28.49 kg/m   Weight change: '@WEIGHTCHANGE'$ @ Height:   Height: '5\' 4"'$  (162.6 cm)  Ht Readings from Last 3 Encounters:  01/06/21 '5\' 4"'$  (1.626 m)  12/09/20 5' 4.5" (1.638 m)  12/01/20 5' 4.5" (1.638 m)    General appearance: alert, cooperative and appears stated age Head: Normocephalic, without obvious abnormality, atraumatic Neck: no adenopathy, supple, symmetrical, trachea midline and thyroid normal to inspection and palpation Breasts: normal appearance, no masses or tenderness Abdomen: soft, non-tender; non distended,  no masses,  no organomegaly Extremities: extremities normal, atraumatic, no cyanosis or edema Skin: Skin color, texture, turgor normal. No rashes or lesions Lymph nodes: Cervical, supraclavicular, and axillary nodes normal. No abnormal inguinal  nodes palpated Neurologic: Grossly normal   Pelvic: External genitalia:  no lesions              Urethra:  normal appearing urethra with no masses, tenderness or lesions              Bartholins and Skenes: normal  Vagina: atrophic appearing vagina, very narrow in the upper 1-2 cm of the vagina              Cervix:  not well seen, the upper vagina is very narrow, unable to open the adolescent speculum very far               Bimanual Exam:  Uterus:   no masses or tenderness              Adnexa: no mass, fullness, tenderness               Rectovaginal: Confirms               Anus:  normal sphincter tone, no lesions  Caryn Bee chaperoned for the exam.  1. GYN exam for high-risk Medicare patient Discussed breast self exam Discussed calcium and vit D intake Mammogram, DEXA and Colonoscopy UTD She will get her TDAP with her primary Labs with primary  2. History of breast cancer On letrozol for 5 years  3. Vaginal atrophy Severe, using replense as needed  4. Herpes simplex infection of genitourinary system Long discussion about herpes, reviewed that one in 5 people have it. Discussed option of medication for out breaks or if she wanted to be sexually active for suppression.

## 2021-01-06 ENCOUNTER — Encounter: Payer: Self-pay | Admitting: Obstetrics and Gynecology

## 2021-01-06 ENCOUNTER — Ambulatory Visit (INDEPENDENT_AMBULATORY_CARE_PROVIDER_SITE_OTHER): Payer: Medicare Other | Admitting: Obstetrics and Gynecology

## 2021-01-06 ENCOUNTER — Other Ambulatory Visit: Payer: Self-pay

## 2021-01-06 VITALS — BP 118/76 | Ht 64.0 in | Wt 166.0 lb

## 2021-01-06 DIAGNOSIS — A6000 Herpesviral infection of urogenital system, unspecified: Secondary | ICD-10-CM

## 2021-01-06 DIAGNOSIS — N952 Postmenopausal atrophic vaginitis: Secondary | ICD-10-CM

## 2021-01-06 DIAGNOSIS — Z9189 Other specified personal risk factors, not elsewhere classified: Secondary | ICD-10-CM | POA: Diagnosis not present

## 2021-01-06 DIAGNOSIS — Z853 Personal history of malignant neoplasm of breast: Secondary | ICD-10-CM

## 2021-01-06 NOTE — Patient Instructions (Signed)

## 2021-01-11 DIAGNOSIS — H401131 Primary open-angle glaucoma, bilateral, mild stage: Secondary | ICD-10-CM | POA: Diagnosis not present

## 2021-01-11 DIAGNOSIS — H15833 Staphyloma posticum, bilateral: Secondary | ICD-10-CM | POA: Diagnosis not present

## 2021-01-11 DIAGNOSIS — H25813 Combined forms of age-related cataract, bilateral: Secondary | ICD-10-CM | POA: Diagnosis not present

## 2021-01-11 DIAGNOSIS — Z853 Personal history of malignant neoplasm of breast: Secondary | ICD-10-CM | POA: Diagnosis not present

## 2021-01-11 DIAGNOSIS — G43109 Migraine with aura, not intractable, without status migrainosus: Secondary | ICD-10-CM | POA: Diagnosis not present

## 2021-01-16 ENCOUNTER — Other Ambulatory Visit: Payer: Self-pay

## 2021-01-16 ENCOUNTER — Encounter: Payer: Self-pay | Admitting: Physical Therapy

## 2021-01-16 ENCOUNTER — Ambulatory Visit: Payer: Medicare Other | Attending: Family Medicine | Admitting: Physical Therapy

## 2021-01-16 DIAGNOSIS — M545 Low back pain, unspecified: Secondary | ICD-10-CM | POA: Diagnosis not present

## 2021-01-16 DIAGNOSIS — M25561 Pain in right knee: Secondary | ICD-10-CM | POA: Insufficient documentation

## 2021-01-16 NOTE — Therapy (Signed)
[place]. [place], [place], [address] Phone: [phone]   Fax:  [phone]  Physical Therapy Treatment  Patient Details  Name: Lindsey Barrett MRN: [id number] Date of Birth: May 19, 1945 Referring Provider (PT): Creig Hines   Encounter Date: 01/16/2021   PT End of Session - 01/16/21 1624     Visit Number 8    Date for PT Re-Evaluation 01/31/21    Authorization Type [employer]    PT Start Time 1530    PT Stop Time 1620    PT Time Calculation (min) 50 min    Activity Tolerance Patient tolerated treatment well    Behavior During Therapy [facility] for tasks assessed/performed             Past Medical History:  Diagnosis Date   Anxiety    Atrophic vaginitis    Breast cancer ([place])    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[place]) 05/20/2015   Breast cyst    x3   DDD (degenerative disc disease)    Diverticulosis    Fasting hyperglycemia    GERD (gastroesophageal reflux disease)    [name] syndrome    Glaucoma    History of hiatal hernia    HSV infection    Hyperlipidemia    Radiation    left breast 50.4 gray   STD (sexually transmitted disease)    HSV    Past Surgical History:  Procedure Laterality Date   APPENDECTOMY  1961   BREAST CYST ASPIRATION     x2 right ,x1 left   COLONOSCOPY  2007   neg   DILATION AND CURETTAGE OF UTERUS  1975   FLEXIBLE SIGMOIDOSCOPY     x2   PELVIC LAPAROSCOPY     RADIOACTIVE SEED GUIDED PARTIAL MASTECTOMY WITH AXILLARY SENTINEL LYMPH NODE BIOPSY Left 05/31/2015   Procedure: LEFT BREAST RADIOACTIVE SEED GUIDED LUMPECTOMY WITH LEFT AXILLARY SENTINEL LYMPH NODE BIOPSY;  Surgeon: [name], MD;  Location: [place];  Service: General;  Laterality: Left;   RE-EXCISION OF BREAST LUMPECTOMY Left 07/01/2015   Procedure: RE-EXCISION OF LEFT BREAST INFERIOR MARGIN;  Surgeon: [name], MD;  Location: [place];   Service: General;  Laterality: Left;   UPPER GASTROINTESTINAL ENDOSCOPY     UPPER GI ENDOSCOPY  10/2010   Dr [name] TOOTH EXTRACTION  1978    There were no vitals filed for this visit.   Subjective Assessment - 01/16/21 1538     Subjective I have a litte bit of knee pain, feel like I am doing better, need to know what to do after PT    Currently in Pain? Yes    Pain Score 1     Pain Location Knee    Pain Orientation Right;Anterior    Pain Descriptors / Indicators Sore    Aggravating Factors  geting on and off the floor                               [facility] Adult PT Treatment/Exercise - 01/16/21 0001       Lumbar Exercises: Stretches   Active Hamstring Stretch Right;Left;3 reps;20 seconds    Piriformis Stretch Right;Left;4 reps;20 seconds    Gastroc Stretch Right;Left;3 reps;20 seconds      Lumbar Exercises: Aerobic   Nustep level 5 x 5 minutes      Lumbar Exercises: Standing   Other  Standing Lumbar Exercises tband hip abduction and extension                     PT Education - 01/16/21 1622     Education Details HEP with tband scapular and hip exercises, Kegels, core, balance and vestibular activities as she has a lot of quesitons about how to be active and safe as she ages and wants to continue with a high quality of life.    Person(s) Educated Patient    Methods Explanation;Demonstration;Tactile cues;Verbal cues;Handout    Comprehension Verbalized understanding;Returned demonstration;Verbal cues required              PT Short Term Goals - 11/22/20 1535       PT SHORT TERM GOAL #1   Title I with HEP    Status Achieved               PT Long Term Goals - 01/16/21 1626       PT LONG TERM GOAL #1   Title go up and down stairs step over step    Status Achieved      PT LONG TERM GOAL #2   Title Patient able to perform ADLS without limitation in ROM    Status Achieved      PT LONG TERM GOAL #3   Title Patient  able to perform ADLs with no pain.    Status Achieved      PT LONG TERM GOAL #4   Title increase right knee AROM to 0-125 degrees flexion    Status Achieved      PT LONG TERM GOAL #5   Title independent with advanced gym program    Status Achieved                   Plan - 01/16/21 1624     Clinical Impression Statement We went over all HEP for strength, fitness, balance and other aspects of leading a healthy and high quality of life.  She had a lot of questions and I answered them and gave information to her.,  She has a cruise scheduled next year and has some concerns about her being able to do the excursions and about vertigo    PT Next Visit Plan d/c goals met    Consulted and Agree with Plan of Care Patient             Patient will benefit from skilled therapeutic intervention in order to improve the following deficits and impairments:  Abnormal gait, Decreased range of motion, Difficulty walking, Increased muscle spasms, Pain, Impaired flexibility, Improper body mechanics, Decreased strength, Decreased mobility  Visit Diagnosis: Acute pain of right knee  Acute bilateral low back pain without sciatica     Problem List Patient Active Problem List   Diagnosis Date Noted   Dermatitis 12/09/2020   Low back pain 10/18/2020   COVID-19 08/31/2020   Patellofemoral arthritis of right knee 08/25/2020   Left wrist pain 04/19/2020   Left leg pain 04/18/2020   Acute pain of right knee 03/09/2020   Sensorineural hearing loss (SNHL) of both ears 03/20/2019   Deviated nasal septum 03/02/2019   Pulmonary nodule 07/29/2018   Dupuytren's contracture of both hands 07/11/2018   Ganglion cyst of volar aspect of left wrist 06/04/2018   Frozen shoulder 03/18/2018   Epistaxis 02/05/2018   Right rotator cuff tear 10/15/2017   Tinnitus of both ears 01/01/2017   Reaction to internal stress 05/29/2016   Prediabetes  08/10/2015   Breast cancer of upper-inner quadrant of left  female breast ([place]) 05/20/2015   Open-angle glaucoma 06/17/2014   Vitamin D deficiency 10/18/2011   HSV infection    Atrophic vaginitis    VARICOSE VEINS, LOWER EXTREMITIES 09/28/2009   GERD 07/14/2009   Hyperlipidemia 12/26/2006   GILBERT'S SYNDROME 08/09/2006   DEGENERATIVE Parker DISEASE 08/09/2006    Sumner Boast, PT., PT 01/16/2021, 4:27 PM  [place]. [place], [place], [address] Phone: [phone]   Fax:  [phone]  Name: Lindsey Barrett MRN: [id number] Date of Birth: 30-Dec-1945

## 2021-01-23 ENCOUNTER — Other Ambulatory Visit: Payer: Self-pay | Admitting: Internal Medicine

## 2021-01-31 IMAGING — CT CT CHEST WITH CONTRAST
1 of 2 series · 14 of 30 positions shown, 18 images · IV contrast (APPLIED)
Comparison: CT cervical spine, 07/14/2018

CLINICAL DATA: Follow-up lung nodule, history of breast cancer

EXAM:
CT CHEST WITH CONTRAST
TECHNIQUE: Multidetector CT imaging of the chest was performed during
intravenous contrast administration.
CONTRAST:  75mL F390FF-GWW IOPAMIDOL (F390FF-GWW) INJECTION 61%

[Series 2: chest w/cm · axial · 0.73mm/px · z∈[+824,+1100]mm · 14 of 162 slices shown, 18 images]
[im 12/162  mediastinal]
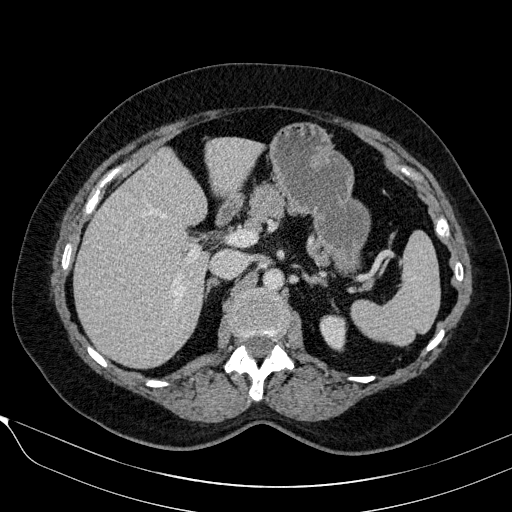
[im 12/162  lung]
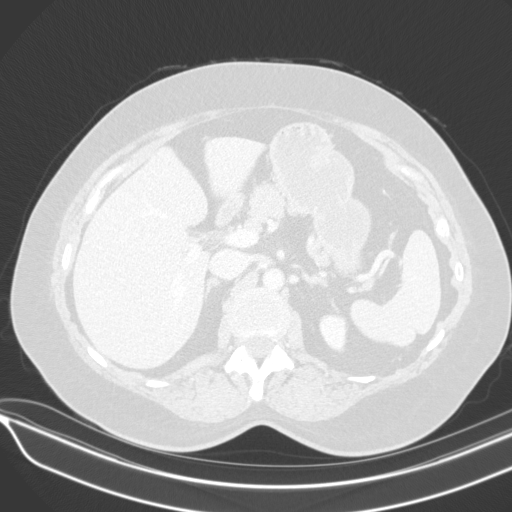
[im 24/162  lung]
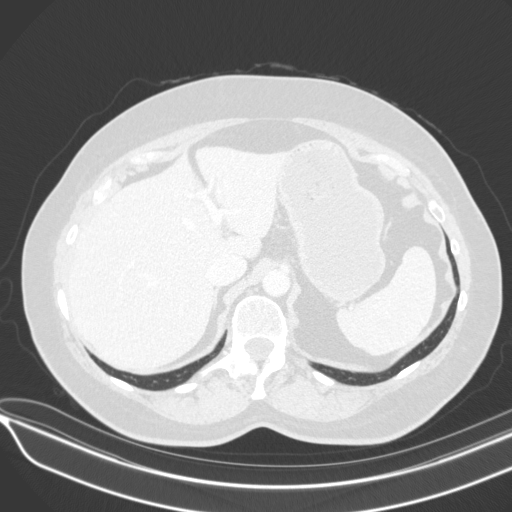
[im 35/162  lung]
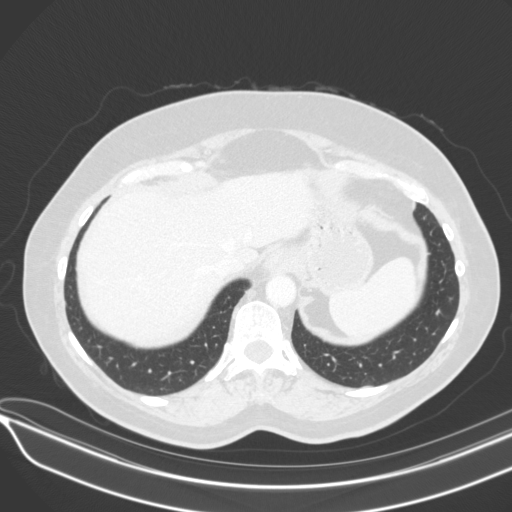
[im 47/162  lung]
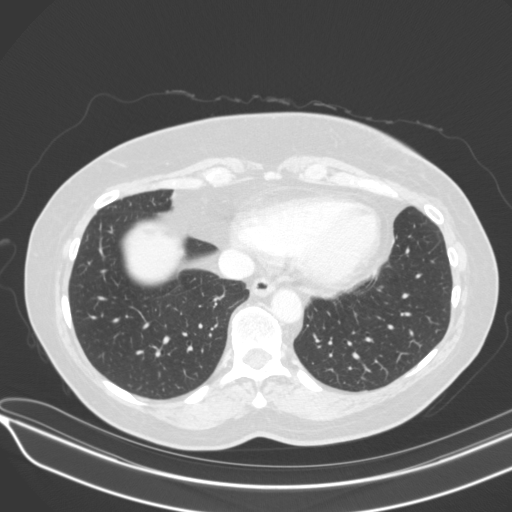
[im 58/162  mediastinal]
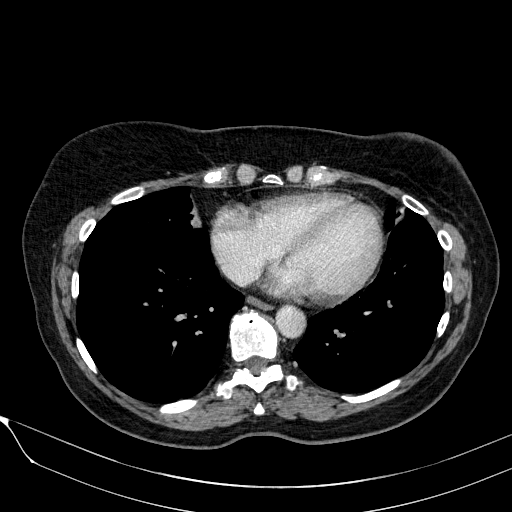
[im 58/162  lung]
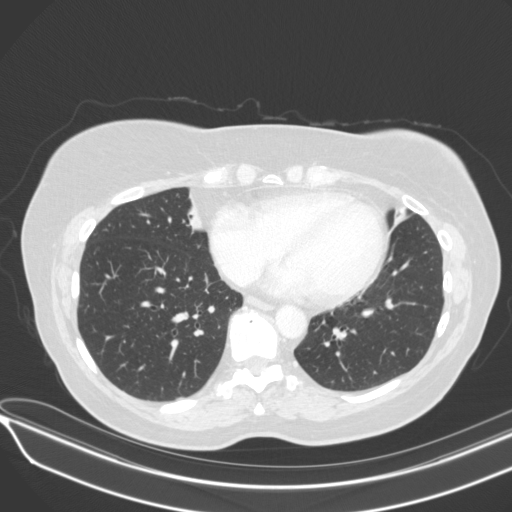
[im 70/162  lung]
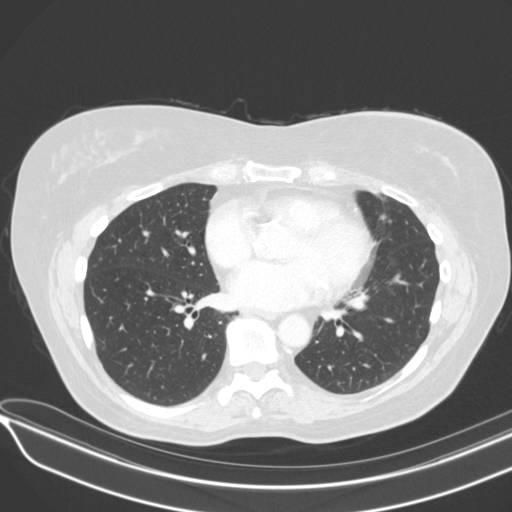
[im 77/162  lung]
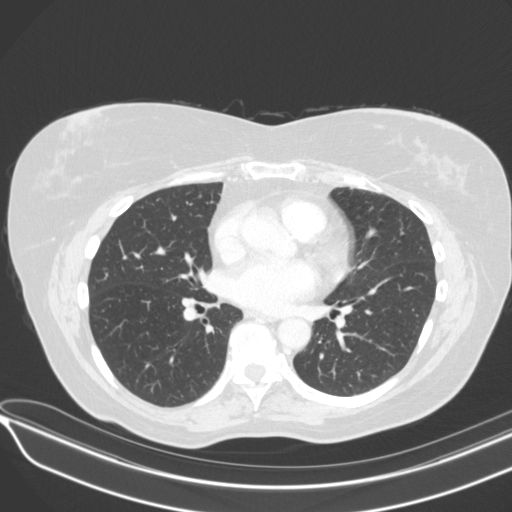
[im 81/162  lung]
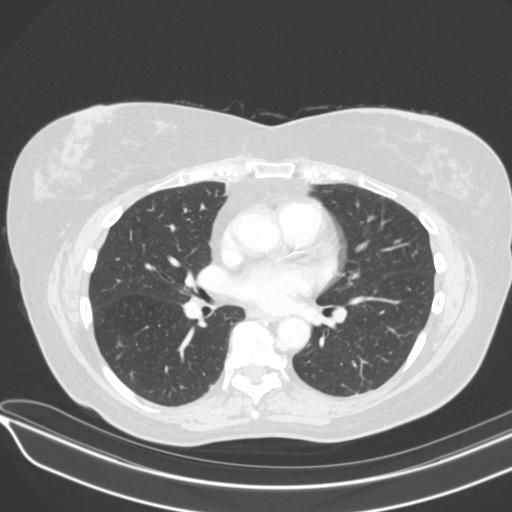
[im 93/162  mediastinal]
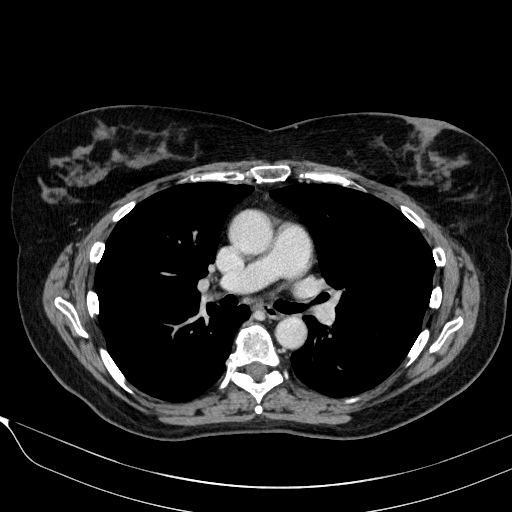
[im 93/162  lung]
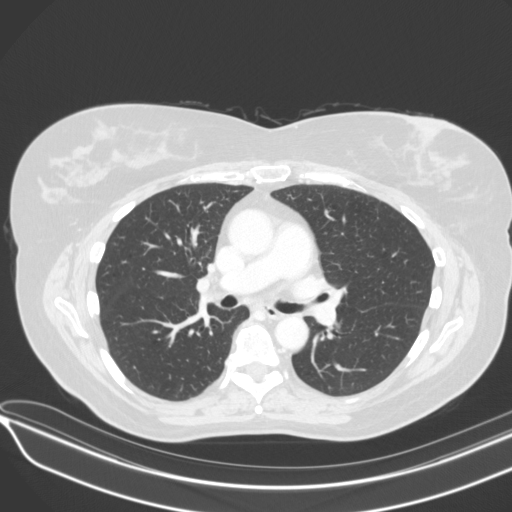
[im 104/162  lung]
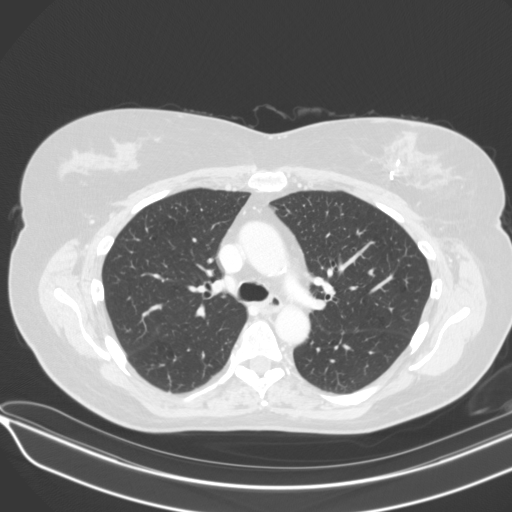
[im 116/162  lung]
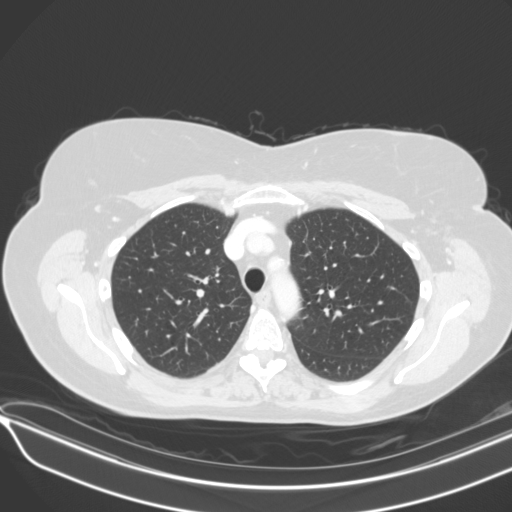
[im 127/162  lung]
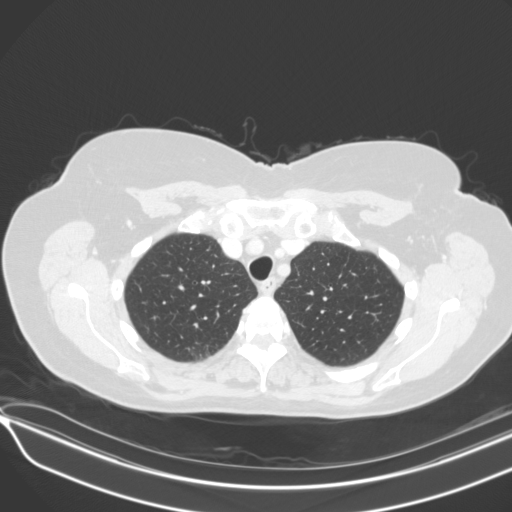
[im 139/162  mediastinal]
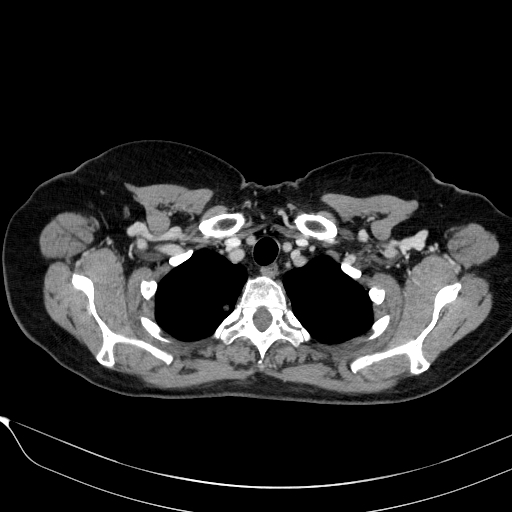
[im 139/162  lung]
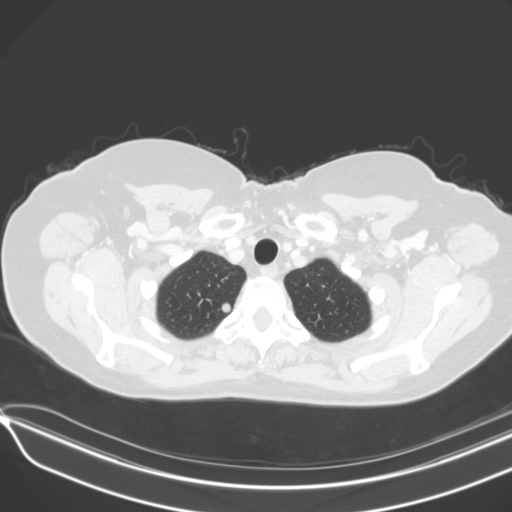
[im 150/162  lung]
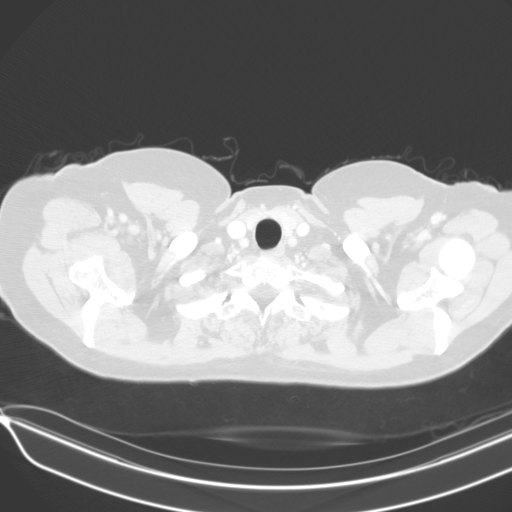

[14 of 30 positions shown; findings below may reference images not displayed]

FINDINGS: Cardiovascular: Scattered coronary artery calcifications. Normal
heart size. No pericardial effusion.

Mediastinum/Nodes: No enlarged mediastinal, hilar, or axillary lymph
nodes. Thyroid gland, trachea, and esophagus demonstrate no
significant findings.

Lungs/Pleura: There is an 8 mm round pulmonary nodule in the right
pulmonary apex (series 5, image 25). No pleural effusion or
pneumothorax.

Upper Abdomen: No acute abnormality.

Musculoskeletal: Surgical clips in the left breast and left axilla.
IMPRESSION: 1. There is an 8 mm round pulmonary nodule in the right pulmonary
apex (series 5, image 25). This is indeterminate although suspicious
for pulmonary metastatic disease given history of breast cancer. No
other pulmonary nodules identified. The nodule is at approximately
the size threshold for PET/CT and could be characterized for
metabolic activity. Given medial, apical location, CT-guided biopsy
may be technically difficult. At minimum, recommend follow-up CT in
3 months to ensure stability.

2.  Postoperative findings of left lumpectomy.

## 2021-02-02 ENCOUNTER — Ambulatory Visit: Payer: Medicare Other | Admitting: Obstetrics & Gynecology

## 2021-02-27 ENCOUNTER — Encounter: Payer: Self-pay | Admitting: Internal Medicine

## 2021-02-28 ENCOUNTER — Ambulatory Visit (INDEPENDENT_AMBULATORY_CARE_PROVIDER_SITE_OTHER): Payer: Medicare Other | Admitting: Cardiovascular Disease

## 2021-02-28 ENCOUNTER — Other Ambulatory Visit: Payer: Self-pay

## 2021-02-28 ENCOUNTER — Encounter: Payer: Self-pay | Admitting: Cardiovascular Disease

## 2021-02-28 DIAGNOSIS — R55 Syncope and collapse: Secondary | ICD-10-CM | POA: Diagnosis not present

## 2021-02-28 DIAGNOSIS — Z853 Personal history of malignant neoplasm of breast: Secondary | ICD-10-CM

## 2021-02-28 DIAGNOSIS — E785 Hyperlipidemia, unspecified: Secondary | ICD-10-CM

## 2021-02-28 DIAGNOSIS — I700 Atherosclerosis of aorta: Secondary | ICD-10-CM | POA: Diagnosis not present

## 2021-02-28 DIAGNOSIS — I2510 Atherosclerotic heart disease of native coronary artery without angina pectoris: Secondary | ICD-10-CM | POA: Diagnosis not present

## 2021-02-28 DIAGNOSIS — K219 Gastro-esophageal reflux disease without esophagitis: Secondary | ICD-10-CM

## 2021-02-28 NOTE — Telephone Encounter (Signed)
Called patient and r/s to 02/28/21 with Dr. Claiborne Billings as patient cannot make the 03/15/21 appointment

## 2021-02-28 NOTE — Patient Instructions (Signed)
Medication Instructions:  Continue current medications. No changes.  *If you need a refill on your cardiac medications before your next appointment, please call your pharmacy*   Lab Work: [place] FASTING LAB WORK ( LIPID, CMET). If you have labs (blood work) drawn today and your tests are completely normal, you will receive your results only by: [place] (if you have MyChart) OR A paper copy in the mail If you have any lab test that is abnormal or we need to change your treatment, we will call you to review the results.  Follow-Up: At [facility], you and your health needs are our priority.  As part of our continuing mission to provide you with exceptional heart care, we have created designated Provider Care Teams.  These Care Teams include your primary Cardiologist (physician) and Advanced Practice Providers (APPs -  Physician Assistants and Nurse Practitioners) who all work together to provide you with the care you need, when you need it.  We recommend signing up for the patient portal called "MyChart".  Sign up information is provided on this After Visit Summary.  MyChart is used to connect with patients for Virtual Visits (Telemedicine).  Patients are able to view lab/test results, encounter notes, upcoming appointments, etc.  Non-urgent messages can be sent to your provider as well.   To learn more about what you can do with MyChart, go to [email or web address].    Your next appointment:   12 month(s)  The format for your next appointment:   In Person  Provider:   Shelva Majestic, MD

## 2021-02-28 NOTE — Progress Notes (Signed)
Cardiology Office Note    Date:  03/07/2021   ID:  Lindsey Barrett, DOB [birth year], MRN [id number]  PCP:  [name], MD  Cardiologist:  Shelva Majestic, MD   13 month F/U cardiology evaluation initially referred through the courtesy of Nicoletta Ba, PA for evaluation of episodes of transient presyncope.  History of Present Illness:  Lindsey Barrett is a 75 y.o. female who had previously been a remote patient of Dr. Ignacia Palma, Dr. Elicia Lamp, and has recently been followed by Dr. Havery Moros and Dr. Christinia Gully.  I saw her for my initial evaluation with me in June 2018 when she was referred by Nicoletta Ba for evaluation of several episodes of presyncope.  I last evaluated her in a telemedicine visit in September 2021.  She presents now for 1 year follow-up evaluation.  Lindsey Barrett denies any awareness of cardiac disease.  Remotely she had been told by Dr. Linna Darner that she had a systolic click on cardiac auscultation.  She has remote tobacco history and had smoked for 30 years from ages 23 until 89 but quit in 2000.  She is followed by Dr. Melvyn Novas for a stable 8 mm nodule in the right lung apex with well-circumscribed margins which has not changed from prior evaluations.  She states that she has experience of episodes where after she has eaten a large meal and sitting down she all of a sudden developed a wave of presyncope.  There is no chest pain and it typically lasts seconds.  She has not frankly blacked out.  She was recently evaluated by Nicoletta Ba, PA-C last week and there was some uncertainty as to what these episodes represented raising possibilities for post prandial hypotension, postprandial vagus dysfunction and concerns for arrhythmia.  She is now referred for cardiology evaluation.  In addition to the above, past history is also notable for breast cancer in 2017, history of adenomatous colonic polyps with a last colonoscopy in 2017.  She also has undergone prior endoscopy  evaluations with Dr. [name].  Carotid studies done in 2019 had shown minimal homogeneous plaque without hemodynamically significant stenoses.  Her most recent chest CT on October 27, 2019 showed a stable 8 mm nodule in the right lung apex.  There was evidence for coronary artery and aortic atherosclerosis.    When I initially saw her, I recommended that she undergo a 2D echo Doppler study as well as a 30-day event monitor.  Her echo Doppler study from November 06, 2019 revealed an EF of 65 to 70% with mild LVH.  She had normal diastolic parameters.  There was no evidence for any significant valvular pathology.  A cardiac monitor was worn for 13 days and showed sinus rhythm with an average rate of 74 bpm.  The slowest heart rate was sinus bradycardia 54 bpm at 6:50 AM the fastest heart rate was sinus tachycardia at 8:27 AM on July 25.  There were no episodes of atrial fibrillation, pauses, or ectopy.   I last evaluated her in a telemedicine visit on January 21, 2020 and at that time had felt well.  She denied.   experiencing any recurrent spells.  Her last episode was in May 2021 and she believes this may have occurred in the setting of some food poisoning.  She has reduced her intake and previously she was eating too much which resulted in abdominal cramping which has resolved.  Since I last saw her, she has continued to be followed by  Dr. 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  She denies any chest pain.  She is unaware of palpitations.  She continues to be on rosuvastatin 40 mg and Zetia 10 mg for hyperlipidemia.  She was recently started on vitamin D supplementation.  Her GERD is controlled and she is followed by Dr. Havery Moros of GI.  She presents for yearly evaluation.   Past Medical History:  Diagnosis Date   Anxiety    Atrophic vaginitis    Breast cancer ([place])    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[place]) 05/20/2015    Breast cyst    x3   DDD (degenerative disc disease)    Diverticulosis    Fasting hyperglycemia    GERD (gastroesophageal reflux disease)    [name] syndrome    Glaucoma    History of hiatal hernia    HSV infection    Hyperlipidemia    Radiation    left breast 50.4 gray   STD (sexually transmitted disease)    HSV    Past Surgical History:  Procedure Laterality Date   APPENDECTOMY  1961   BREAST CYST ASPIRATION     x2 right ,x1 left   COLONOSCOPY  2007   neg   DILATION AND CURETTAGE OF UTERUS  1975   FLEXIBLE SIGMOIDOSCOPY     x2   PELVIC LAPAROSCOPY     RADIOACTIVE SEED GUIDED PARTIAL MASTECTOMY WITH AXILLARY SENTINEL LYMPH NODE BIOPSY Left 05/31/2015   Procedure: LEFT BREAST RADIOACTIVE SEED GUIDED LUMPECTOMY WITH LEFT AXILLARY SENTINEL LYMPH NODE BIOPSY;  Surgeon: [name], MD;  Location: [facility];  Service: General;  Laterality: Left;   RE-EXCISION OF BREAST LUMPECTOMY Left 07/01/2015   Procedure: RE-EXCISION OF LEFT BREAST INFERIOR MARGIN;  Surgeon: [name], MD;  Location: [facility];  Service: General;  Laterality: Left;   UPPER GASTROINTESTINAL ENDOSCOPY     UPPER GI ENDOSCOPY  10/2010   Dr [name] TOOTH EXTRACTION  1978    Current Medications: Outpatient Medications Prior to Visit  Medication Sig Dispense Refill   aspirin EC 81 MG tablet Take 1 tablet (81 mg total) by mouth daily.     bimatoprost (LUMIGAN) 0.01 % SOLN Place 1 drop into both eyes at bedtime.     brinzolamide (AZOPT) 1 % ophthalmic suspension 1 drop 2 (two) times daily.     calcium carbonate (TUMS - DOSED IN MG ELEMENTAL CALCIUM) 500 MG chewable tablet Chew 1 tablet by mouth as needed for indigestion or heartburn (1000 mg).      Carboxymethylcellulose Sodium (REFRESH LIQUIGEL OP) Apply 1 drop to eye 4 (four) times daily as needed.     Cholecalciferol (VITAMIN D3 PO) Take 5,000 Int'l Units by mouth daily.      COVID-19 mRNA Vac-TriS, Pfizer,  (PFIZER-BIONT COVID-19 VAC-TRIS) SUSP injection Inject into the muscle. 0.3 mL 0   ezetimibe (ZETIA) 10 MG tablet Take 1 tablet (10 mg total) by mouth daily. 30 tablet 5   famotidine (PEPCID) 10 MG tablet Take 1 tablet (10 mg total) by mouth daily. (Patient taking differently: Take 10 mg by mouth. Daily (may take twice daily if needed)) 90 tablet 3   ibuprofen (ADVIL) 200 MG tablet Take 200 mg by mouth every 6 (six) hours as needed.     letrozole (FEMARA) 2.5 MG tablet Take 1 tablet (2.5 mg total) by mouth daily. 90 tablet 3   sodium chloride (MURO  128) 5 % ophthalmic solution Place 1 drop into both eyes 2 (two) times a day. Before Azopt     Vaginal Lubricant (REPLENS) GEL Place 1 application vaginally daily.     rosuvastatin (CRESTOR) 40 MG tablet Take 1 tablet by mouth once daily 90 tablet 0   betamethasone valerate ointment (VALISONE) 0.1 % Place a pea sized amount topically BID for up to 1 week as needed (Patient not taking: No sig reported) 15 g 0   No facility-administered medications prior to visit.     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  Tobacco Use   Smoking status: Former    Types: Cigarettes    Quit date: 03/28/1999    Years since quitting: 21.9   Smokeless tobacco: Never   Tobacco comments:    smoked 1967-2000, up to 3/4 ppd  Vaping Use   Vaping Use: Never used  Substance and Sexual Activity   Alcohol use: Yes    Alcohol/week: 10.0 standard drinks    Types: 10 Cans of beer per week   Drug use: No   Sexual activity: Not Currently    Birth control/protection: Post-menopausal     Comment: 1st intercourse 75 yo-More than 5 partners  Other Topics Concern   Not on file  Social History Narrative   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      Regular exercise-no   Social Determinants of Health   Financial Resource Strain: Not on file  Food Insecurity: Not on file  Transportation Needs: Not on file  Physical Activity: Not on file  Stress: Not on file  Social Connections: Not on file     Family History:  The patient's family history includes Breast cancer in her paternal aunt; Cancer in her mother; Cancer (age of onset: 61) in her father; Colon cancer in her paternal aunt; Coronary artery disease in her father; Endometriosis in her mother; Leukemia in her maternal uncle; Liver cancer in her maternal uncle; Lung cancer in her mother; Stomach cancer in her maternal grandmother; Stroke (age of onset: 35) in her paternal grandfather; Sudden death in her sister; Vascular Disease in her paternal grandmother.   ROS General: Negative; No fevers, chills, or night sweats;  HEENT: Negative; No changes in vision or hearing, sinus congestion, difficulty swallowing Pulmonary: History of lung nodule followed by Dr. Lenard Forth Cardiovascular: Negative; No chest pain, presyncope, syncope, palpitations GI: Increased abdominal gas GU: Negative; No dysuria, hematuria, or difficulty voiding Musculoskeletal: Negative; no myalgias, joint pain, or weakness Hematologic/Oncology: History of breast cancer  Endocrine: Negative; no heat/cold intolerance; no diabetes Neuro: Negative; no changes in balance, headaches Skin: Negative; No rashes or skin lesions Psychiatric: Negative; No behavioral problems, depression Sleep: Negative; No snoring, daytime sleepiness, hypersomnolence, bruxism, restless legs, hypnogognic hallucinations, no cataplexy Other comprehensive 14 point system review is negative.   PHYSICAL EXAM:   VS:  BP 118/68 (BP Location: Right Arm)   Pulse 68   Ht  '5\' 4"'  (1.626 m)   Wt 168 lb 6.4 oz (76.4 kg)   SpO2 98%   BMI 28.91 kg/m  Repeat blood pressure by me was 130/70  Wt Readings from Last 3 Encounters:  02/28/21 168 lb 6.4 oz (76.4 kg)  01/06/21 166 lb (75.3 kg)  12/09/20 166 lb (75.3 kg)    General: Alert, oriented, no distress.  Skin: normal turgor, no rashes, warm and dry HEENT: Normocephalic, atraumatic. Pupils equal round and reactive to light; sclera anicteric; extraocular muscles intact;  Nose without nasal septal hypertrophy Mouth/Parynx benign; Mallinpatti scale 3 Neck: No JVD, no carotid bruits; normal carotid upstroke Lungs: clear to ausculatation and percussion; no wheezing or rales Chest wall: without tenderness to palpitation Heart: PMI not displaced, RRR, s1 s2 normal, 1/6 systolic murmur, no diastolic murmur, no rubs, gallops, thrills, or heaves Abdomen: soft, nontender; no hepatosplenomehaly, BS+; abdominal aorta nontender and not dilated by palpation. Back: no CVA tenderness Pulses 2+ Musculoskeletal: full range of motion, normal strength, no joint deformities Extremities: Spider veins around her ankles bilaterally without significant edema; no clubbing or cyanosis, Homan's sign negative  Neurologic: grossly nonfocal; Cranial nerves grossly wnl Psychologic: Normal mood and affect  Studies/Labs Reviewed:   EKG:  EKG is ordered today.  ECG (independently read by me):  NSR at 68, nonspecific T wave abnormality  November 02, 2019 ECG (independently read by me): Normal sinus rhythm at 72 bpm.  Low voltage.  Nonspecific T wave change.  Intervals normal    Recent Labs: BMP Latest Ref Rng & Units 10/18/2020 10/18/2020 02/19/2020  Glucose 70 - 99 mg/dL - 119(H) 97  BUN 6 - 23 mg/dL - 12 10  Creatinine 0.40 - 1.20 mg/dL - 0.63 0.67  BUN/Creat Ratio 12 - 28 - - -  Sodium 135 - 145 mEq/L - 138 141  Potassium 3.5 - 5.1 mEq/L - 4.1 4.3  Chloride 96 - 112 mEq/L - 104 105  CO2 19 - 32 mEq/L - 28 31  Calcium 8.6 - 10.4  mg/dL 9.5 9.4 9.1     Hepatic Function Latest Ref Rng & Units 10/18/2020 02/19/2020 11/04/2019  Total Protein 6.0 - 8.3 g/dL 7.1 7.0 6.8  Albumin 3.5 - 5.2 g/dL 4.3 4.1 4.4  AST 0 - 37 U/L '25 25 21  ' ALT 0 - 35 U/L '23 20 15  ' Alk Phosphatase 39 - 117 U/L 51 53 73  Total Bilirubin 0.2 - 1.2 mg/dL 1.7(H) 1.3(H) 1.1  Bilirubin, Direct 0.0 - 0.3 mg/dL - - -    CBC Latest Ref Rng & Units 10/18/2020 11/04/2019 01/22/2019  WBC 4.0 - 10.5 K/uL 7.1 8.0 6.3  Hemoglobin 12.0 - 15.0 g/dL 13.6 14.0 13.2  Hematocrit 36.0 - 46.0 % 40.8 43.8 39.7  Platelets 150.0 - 400.0 K/uL 255.0 246 216.0   Lab Results  Component Value Date   MCV 85.6 10/18/2020   MCV 90 11/04/2019   MCV 87.3 01/22/2019   Lab Results  Component Value Date   TSH 1.79 10/18/2020   Lab Results  Component Value Date   HGBA1C 6.2 02/19/2020     BNP No results found for: BNP  ProBNP No results found for: PROBNP   Lipid Panel     Component Value Date/Time   CHOL 163 02/19/2020 0932   CHOL 209 (H) 11/04/2019 1005   CHOL 216 (H) 06/18/2014 1053   TRIG 111.0 02/19/2020 0932   TRIG 110 06/18/2014 1053   HDL 50.40 02/19/2020 0932   HDL 46 11/04/2019 1005   HDL 52 06/18/2014 1053   CHOLHDL 3 02/19/2020 0932   VLDL 22.2 02/19/2020 0932   LDLCALC  90 02/19/2020 0932   LDLCALC 136 (H) 11/04/2019 1005   LDLCALC 142 (H) 06/18/2014 1053   LDLDIRECT 148.9 09/23/2009 0835   LABVLDL 27 11/04/2019 1005     RADIOLOGY: No results found.    Additional studies/ records that were reviewed today include:  I reviewed records from  gastroenterology, [facility], carotid duplex imaging, CT imaging of her chest.    ASSESSMENT:    1. Hyperlipidemia with target LDL less than 70   2. Atherosclerosis of native coronary artery of native heart without angina pectoris   3. Aortic atherosclerosis ([place])   4. Pre-syncope; resolved   5. Gastroesophageal reflux disease without esophagitis   6. History of breast cancer     PLAN:   Lindsey Barrett is a 75 year-old female who had seen her remotely when she had experienced a wave of presyncope following eating.  She had been told in the past of having issues with her vagus nerve and the issues were concerning for possible postprandial transient hypotension versus postprandial vagus nerve dysfunction.  I initially saw her, I recommended she undergo a 2D echo Doppler study and an event monitor.  Her echo Doppler study from July 2021 showed an EF of 65 to 70% with mild LVH and normal diastolic parameters.  There was no significant valvular pathology.  A cardiac monitor showed predominant sinus rhythm at an average rate at 74 bpm with most heart rate at sinus bradycardia at 6:50 AM and fast heart rate with sinus tachycardia.  There were no episodes of atrial fibrillation, pauses or ectopy.  Presently she has been without recurrent episodes.  Her blood pressure today is stable.  Her ECG is normal sinus rhythm at 68 bpm with nonspecific T wave abnormality.  She continues to undergo treatment by Dr. Payton Mccallum her previous stage I left breast CVA and lumpectomy with radiation treatment.  She is on rosuvastatin and Zetia for hyperlipidemia.  She is not had recent laboratory and I will check a comprehensive metabolic panel and lipid studies.  She is followed by Dr. Enis Gash for GI and is now on Pepcid for her GERD.  She is sleeping well.  She does have spider veins around her ankles without significant edema.  In December 2021 she underwent lower extremity venous Doppler study which was negative for DVT.  BMI is stable.  Clinically she is doing well.  I will see her in 1 year for reevaluation or sooner as needed.   Medication Adjustments/Labs and Tests Ordered: Current medicines are reviewed at length with the patient today.  Concerns regarding medicines are outlined above.  Medication changes, Labs and Tests ordered today are listed in the Patient Instructions below. Patient Instructions   Medication Instructions:  Continue current medications. No changes.  *If you need a refill on your cardiac medications before your next appointment, please call your pharmacy*   Lab Work: [facility] FASTING LAB WORK ( LIPID, CMET). If you have labs (blood work) drawn today and your tests are completely normal, you will receive your results only by: [place] (if you have MyChart) OR A paper copy in the mail If you have any lab test that is abnormal or we need to change your treatment, we will call you to review the results.  Follow-Up: At [facility], you and your health needs are our priority.  As part of our continuing mission to provide you with exceptional heart care, we have created designated Provider Care Teams.  These Care Teams  include your primary Cardiologist (physician) and Advanced Practice Providers (APPs -  Physician Assistants and Nurse Practitioners) who all work together to provide you with the care you need, when you need it.  We recommend signing up for the patient portal called "MyChart".  Sign up information is provided on this After Visit Summary.  MyChart is used to connect with patients for Virtual Visits (Telemedicine).  Patients are able to view lab/test results, encounter notes, upcoming appointments, etc.  Non-urgent messages can be sent to your provider as well.   To learn more about what you can do with MyChart, go to [email or web address].    Your next appointment:   12 month(s)  The format for your next appointment:   In Person  Provider:   Shelva Majestic, MD     Signed, Shelva Majestic, MD  03/07/2021 12:32 PM    [facility] [address] [place], [place], [place]  [address] Phone: [phone]

## 2021-03-01 DIAGNOSIS — H40053 Ocular hypertension, bilateral: Secondary | ICD-10-CM | POA: Diagnosis not present

## 2021-03-01 DIAGNOSIS — H524 Presbyopia: Secondary | ICD-10-CM | POA: Diagnosis not present

## 2021-03-01 DIAGNOSIS — H2513 Age-related nuclear cataract, bilateral: Secondary | ICD-10-CM | POA: Diagnosis not present

## 2021-03-01 DIAGNOSIS — H52223 Regular astigmatism, bilateral: Secondary | ICD-10-CM | POA: Diagnosis not present

## 2021-03-01 DIAGNOSIS — H5213 Myopia, bilateral: Secondary | ICD-10-CM | POA: Diagnosis not present

## 2021-03-01 DIAGNOSIS — H18593 Other hereditary corneal dystrophies, bilateral: Secondary | ICD-10-CM | POA: Diagnosis not present

## 2021-03-02 ENCOUNTER — Encounter: Payer: Self-pay | Admitting: Internal Medicine

## 2021-03-02 ENCOUNTER — Other Ambulatory Visit: Payer: Self-pay | Admitting: Internal Medicine

## 2021-03-06 DIAGNOSIS — D225 Melanocytic nevi of trunk: Secondary | ICD-10-CM | POA: Diagnosis not present

## 2021-03-06 DIAGNOSIS — Z23 Encounter for immunization: Secondary | ICD-10-CM | POA: Diagnosis not present

## 2021-03-06 DIAGNOSIS — L821 Other seborrheic keratosis: Secondary | ICD-10-CM | POA: Diagnosis not present

## 2021-03-06 DIAGNOSIS — Z86018 Personal history of other benign neoplasm: Secondary | ICD-10-CM | POA: Diagnosis not present

## 2021-03-06 DIAGNOSIS — L578 Other skin changes due to chronic exposure to nonionizing radiation: Secondary | ICD-10-CM | POA: Diagnosis not present

## 2021-03-06 DIAGNOSIS — L814 Other melanin hyperpigmentation: Secondary | ICD-10-CM | POA: Diagnosis not present

## 2021-03-06 DIAGNOSIS — D2262 Melanocytic nevi of left upper limb, including shoulder: Secondary | ICD-10-CM | POA: Diagnosis not present

## 2021-03-07 ENCOUNTER — Encounter: Payer: Self-pay | Admitting: Cardiovascular Disease

## 2021-03-09 ENCOUNTER — Ambulatory Visit: Payer: Medicare Other | Admitting: Family Medicine

## 2021-03-15 ENCOUNTER — Ambulatory Visit: Payer: Medicare Other | Admitting: Cardiovascular Disease

## 2021-03-24 ENCOUNTER — Other Ambulatory Visit: Payer: Self-pay | Admitting: Cardiovascular Disease

## 2021-03-29 ENCOUNTER — Encounter: Payer: Self-pay | Admitting: Nurse Practitioner

## 2021-03-29 ENCOUNTER — Ambulatory Visit (INDEPENDENT_AMBULATORY_CARE_PROVIDER_SITE_OTHER): Payer: Medicare Other | Admitting: Nurse Practitioner

## 2021-03-29 ENCOUNTER — Other Ambulatory Visit: Payer: Self-pay

## 2021-03-29 ENCOUNTER — Telehealth: Payer: Self-pay | Admitting: Internal Medicine

## 2021-03-29 VITALS — BP 124/68 | HR 93 | Temp 98.8°F | Resp 14 | Ht 64.5 in | Wt 165.4 lb

## 2021-03-29 DIAGNOSIS — R002 Palpitations: Secondary | ICD-10-CM | POA: Diagnosis not present

## 2021-03-29 DIAGNOSIS — R051 Acute cough: Secondary | ICD-10-CM | POA: Diagnosis not present

## 2021-03-29 DIAGNOSIS — J029 Acute pharyngitis, unspecified: Secondary | ICD-10-CM | POA: Insufficient documentation

## 2021-03-29 DIAGNOSIS — R42 Dizziness and giddiness: Secondary | ICD-10-CM

## 2021-03-29 LAB — POCT INFLUENZA A/B
Influenza A, POC: NEGATIVE
Influenza B, POC: NEGATIVE

## 2021-03-29 LAB — POC COVID19 BINAXNOW: SARS Coronavirus 2 Ag: NEGATIVE

## 2021-03-29 MED ORDER — BENZONATATE 200 MG PO CAPS: 200.0000 mg | ORAL_CAPSULE | Freq: Two times a day (BID) | ORAL | 0 refills | Status: DC | PRN

## 2021-03-29 MED ORDER — FLUTICASONE PROPIONATE 50 MCG/ACT NA SUSP: 2.0000 | Freq: Every day | NASAL | 6 refills | Status: DC

## 2021-03-29 NOTE — Telephone Encounter (Signed)
Pt. [facility] [employer] 11.23.2022.   Caller stated that she has sore throat, occ chest pain, occ productive cough. She hears a wheezing when she breathes in.   Caller states she has an upper respiratory infection.   Advised to see PCP within 24 hours. Appointment scheduled with another provider.

## 2021-03-29 NOTE — Assessment & Plan Note (Signed)
Describes more of a scratchy dry throat negative COVID and flu in office.  Continue to monitor.  Over-the-counter symptomatic treatment if beneficial

## 2021-03-29 NOTE — Assessment & Plan Note (Signed)
We will send in some Tessalon Perles for patient use.  She is made aware sent to pharmacy Start Tessalon Perles 200 mg twice daily as needed cough.  Continue to monitor

## 2021-03-29 NOTE — Assessment & Plan Note (Signed)
States she was sitting still and could see and feel her heartbeat throughout her whole body does have cardiac history did EKG in office when compared to the one done last month no significant changes continue to monitor.

## 2021-03-29 NOTE — Patient Instructions (Signed)
Nice to see you today Pick up prescriptions at pharmacy If you are not improving by Monday reach out to me via my chart You can also get Mucinex over the counter to help with the phlegm

## 2021-03-29 NOTE — Progress Notes (Signed)
Acute Office Visit  Subjective:    Patient ID: Lindsey Barrett, female    DOB: 1946/03/31, 75 y.o.   MRN: [id number]  Chief Complaint  Patient presents with   Cough    Sx started on 03/28/21, woke up with very dry throat, some cough, some burning sensation in her lungs, some wheezing, some post nasal drip. Some dizziness/lightheadedness started too, some head fullness feeling, some ear clicking/popping.     HPI Patient is in today for cough  Symptoms started 03/28/2021  Covid test: No, had covid in April but has not tested this time  Vaccinated against covid with 2 boosters  Sick contacts: 3 large church functions over this past weekend  Has not taken any OTC medications yet.  Was concerned as she was having work done to her house and they were redoing popcorn ceilings and walls and used potent chemicals. They have been out of her house for approx 1 week  Heartbeat: states when she was still she could see and feel her heartbeat. Thought as abnormal. Also having episodes of light-headedness. Denies room spinning and not vertigo as she has experienced that in the past. 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   Breast cyst    x3   DDD (degenerative disc disease)    Diverticulosis    Fasting hyperglycemia    GERD (gastroesophageal reflux disease)    [name] syndrome    Glaucoma    History of hiatal hernia    HSV infection    Hyperlipidemia    Radiation    left breast 50.4 gray   STD (sexually transmitted disease)    HSV    Past Surgical History:  Procedure Laterality Date   APPENDECTOMY  1961   BREAST CYST ASPIRATION     x2 right ,x1 left   COLONOSCOPY  2007   neg   DILATION AND CURETTAGE OF UTERUS  1975   FLEXIBLE SIGMOIDOSCOPY     x2   PELVIC LAPAROSCOPY     RADIOACTIVE SEED GUIDED  PARTIAL MASTECTOMY WITH AXILLARY SENTINEL LYMPH NODE BIOPSY Left 05/31/2015   Procedure: LEFT BREAST RADIOACTIVE SEED GUIDED LUMPECTOMY WITH LEFT AXILLARY SENTINEL LYMPH NODE BIOPSY;  Surgeon: [name], MD;  Location: [facility];  Service: General;  Laterality: Left;   RE-EXCISION OF BREAST LUMPECTOMY Left 07/01/2015   Procedure: RE-EXCISION OF LEFT BREAST INFERIOR MARGIN;  Surgeon: [name], MD;  Location: [facility];  Service: General;  Laterality: Left;   UPPER GASTROINTESTINAL ENDOSCOPY     UPPER GI ENDOSCOPY  10/2010   Dr [name] TOOTH EXTRACTION  1978    Family History  Problem Relation Age of Onset   Lung cancer Mother        smoker   Endometriosis Mother    Cancer Mother        Bladder cancer   Stomach cancer Maternal Grandmother    Coronary artery disease Father    Cancer Father 25       intestinal carcinoid tumors of the ileum   Sudden death Sister        13 week old, congenital deformity   Liver cancer Maternal Uncle    Stroke Paternal Grandfather 35   Leukemia Maternal Uncle    Breast cancer Paternal  Aunt        great paternal aunt-Age 42's   Colon cancer Paternal Aunt        lived to 73; pat great aunt   Vascular Disease Paternal Grandmother        thoracic aneurysm   Esophageal cancer Neg Hx    Rectal cancer Neg Hx    Pancreatic cancer Neg Hx     Social History   Socioeconomic History   Marital status: Divorced    Spouse name: Not on file   Number of children: 0   Years of education: Not on file   Highest education level: Not on file  Occupational History   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  Tobacco Use   Smoking status: Former    Types: Cigarettes    Quit date: 03/28/1999    Years since quitting: 22.0   Smokeless tobacco: Never   Tobacco comments:    smoked 1967-2000, up to 3/4 ppd  Vaping Use   Vaping Use: Never used  Substance and Sexual Activity   Alcohol use: Yes     Alcohol/week: 10.0 standard drinks    Types: 10 Cans of beer per week   Drug use: No   Sexual activity: Not Currently    Birth control/protection: Post-menopausal    Comment: 1st intercourse 75 yo-More than 5 partners  Other Topics Concern   Not on file  Social History Narrative   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      Regular exercise-no   Social Determinants of Health   Financial Resource Strain: Not on file  Food Insecurity: Not on file  Transportation Needs: Not on file  Physical Activity: Not on file  Stress: Not on file  Social Connections: Not on file  Intimate Partner Violence: Not on file    Outpatient Medications Prior to Visit  Medication Sig Dispense Refill   aspirin EC 81 MG tablet Take 1 tablet (81 mg total) by mouth daily.     bimatoprost (LUMIGAN) 0.01 % SOLN Place 1 drop into both eyes at bedtime.     brinzolamide (AZOPT) 1 % ophthalmic suspension 1 drop 2 (two) times daily.     calcium carbonate (TUMS - DOSED IN MG ELEMENTAL CALCIUM) 500 MG chewable tablet Chew 1 tablet by mouth as needed for indigestion or heartburn (1000 mg).      Carboxymethylcellulose Sodium (REFRESH LIQUIGEL OP) Apply 1 drop to eye 4 (four) times daily as needed.     Cholecalciferol (VITAMIN D3 PO) Take 5,000 Int'l Units by mouth daily.      ezetimibe (ZETIA) 10 MG tablet Take 1 tablet by mouth once daily 30 tablet 0   famotidine (PEPCID) 10 MG tablet Take 1 tablet (10 mg total) by mouth daily. (Patient taking differently: Take 10 mg by mouth. Daily (may take twice daily if needed)) 90 tablet 3   ibuprofen (ADVIL) 200 MG tablet Take 200 mg by mouth every 6 (six) hours as needed.     letrozole (FEMARA) 2.5 MG tablet Take 1 tablet (2.5 mg total) by mouth daily. 90 tablet 3   rosuvastatin (CRESTOR) 40 MG tablet Take 1 tablet by mouth once daily 90 tablet 0   sodium chloride (MURO 128) 5 % ophthalmic solution Place 1 drop into both eyes 2 (two) times a day. Before  Azopt     Vaginal Lubricant (REPLENS) GEL Place 1 application vaginally daily.     betamethasone valerate ointment (VALISONE) 0.1 %  Place a pea sized amount topically BID for up to 1 week as needed (Patient not taking: Reported on 01/06/2021) 15 g 0   COVID-19 mRNA Vac-TriS, Pfizer, (PFIZER-BIONT COVID-19 VAC-TRIS) SUSP injection Inject into the muscle. 0.3 mL 0   No facility-administered medications prior to visit.    Allergies  Allergen Reactions   Arimidex [Anastrozole] Other (See Comments)    Unable to function, loopy, dizzy, and mentally fogginess   Latex Rash and Other (See Comments)    Gloves, Band-Aids    Septra Ds [Sulfamethoxazole-Trimethoprim] Hives   Sulfa Antibiotics Hives   Adhesive [Tape] Rash   Augmentin [Amoxicillin-Pot Clavulanate] Diarrhea    Review of Systems  Constitutional:  Positive for fatigue. Negative for chills and fever.  HENT:  Positive for ear pain (click and clear with swallowing) and sore throat (dry scrtachy thorat). Negative for congestion, postnasal drip, rhinorrhea, sinus pressure and sneezing.   Respiratory:  Positive for cough (productive but cannot get up) and wheezing (whistle). Negative for shortness of breath.   Cardiovascular:  Negative for chest pain.  Gastrointestinal:  Negative for constipation, nausea and vomiting.  Neurological:  Positive for light-headedness and headaches. Negative for dizziness.      Objective:    Physical Exam Constitutional:      Appearance: Normal appearance.  HENT:     Right Ear: Tympanic membrane, ear canal and external ear normal.     Left Ear: Tympanic membrane, ear canal and external ear normal.     Mouth/Throat:     Mouth: Mucous membranes are moist.  Eyes:     Extraocular Movements: Extraocular movements intact.     Pupils: Pupils are equal, round, and reactive to light.  Cardiovascular:     Rate and Rhythm: Normal rate and regular rhythm.     Pulses: Normal pulses.  Pulmonary:     Effort:  Pulmonary effort is normal.     Breath sounds: Normal breath sounds.  Abdominal:     General: Bowel sounds are normal.  Neurological:     Mental Status: She is alert.     Cranial Nerves: Cranial nerves 2-12 are intact. No facial asymmetry.     Motor: No weakness.     Coordination: Finger-Nose-Finger Test normal.     Gait: Tandem walk normal.     Deep Tendon Reflexes:     Reflex Scores:      Bicep reflexes are 1+ on the right side and 1+ on the left side.      Patellar reflexes are 1+ on the right side and 1+ on the left side.    Comments: Bilateral upper and lower extremity strength 5/5  Psychiatric:        Mood and Affect: Mood normal.        Behavior: Behavior normal.        Thought Content: Thought content normal.        Judgment: Judgment normal.    There were no vitals taken for this visit. Wt Readings from Last 3 Encounters:  02/28/21 168 lb 6.4 oz (76.4 kg)  01/06/21 166 lb (75.3 kg)  12/09/20 166 lb (75.3 kg)    Health Maintenance Due  Topic Date Due   Zoster Vaccines- Shingrix (1 of 2) Never done   TETANUS/TDAP  08/01/2020   INFLUENZA VACCINE  12/05/2020   COVID-19 Vaccine (5 - Booster for Pfizer series) 02/07/2021   COLONOSCOPY (Pts 45-50yr Insurance coverage will need to be confirmed)  02/12/2021    There are no preventive  care reminders to display for this patient.   Lab Results  Component Value Date   TSH 1.79 10/18/2020   Lab Results  Component Value Date   WBC 7.1 10/18/2020   HGB 13.6 10/18/2020   HCT 40.8 10/18/2020   MCV 85.6 10/18/2020   PLT 255.0 10/18/2020   Lab Results  Component Value Date   NA 138 10/18/2020   K 4.1 10/18/2020   CHLORIDE 106 05/25/2015   CO2 28 10/18/2020   GLUCOSE 119 (H) 10/18/2020   BUN 12 10/18/2020   CREATININE 0.63 10/18/2020   BILITOT 1.7 (H) 10/18/2020   ALKPHOS 51 10/18/2020   AST 25 10/18/2020   ALT 23 10/18/2020   PROT 7.1 10/18/2020   ALBUMIN 4.3 10/18/2020   CALCIUM 9.5 10/18/2020   CALCIUM 9.4  10/18/2020   ANIONGAP 6 05/25/2015   EGFR 82 (L) 05/25/2015   GFR 88.08 10/18/2020   Lab Results  Component Value Date   CHOL 163 02/19/2020   Lab Results  Component Value Date   HDL 50.40 02/19/2020   Lab Results  Component Value Date   LDLCALC 90 02/19/2020   Lab Results  Component Value Date   TRIG 111.0 02/19/2020   Lab Results  Component Value Date   CHOLHDL 3 02/19/2020   Lab Results  Component Value Date   HGBA1C 6.2 02/19/2020       Assessment & Plan:   Problem List Items Addressed This Visit       Other   Acute cough - Primary    We will send in some Tessalon Perles for patient use.  She is made aware sent to pharmacy Start Tessalon Perles 200 mg twice daily as needed cough.  Continue to monitor      Relevant Medications   benzonatate (TESSALON) 200 MG capsule   Other Relevant Orders   Influenza A/B (Completed)   POC COVID-19 (Completed)   Light headedness    9.  EKG within normal limits compared to labs from cardiologist office.  Continue to monitor we will send in some Flonase as it could be more inner ear type things given she is dealing with a viral infection continue to monitor      Relevant Medications   fluticasone (FLONASE) 50 MCG/ACT nasal spray   Other Relevant Orders   EKG 12-Lead (Completed)   Sore throat    Describes more of a scratchy dry throat negative COVID and flu in office.  Continue to monitor.  Over-the-counter symptomatic treatment if beneficial      Relevant Orders   Influenza A/B (Completed)   POC COVID-19 (Completed)   Pounding heartbeat    States she was sitting still and could see and feel her heartbeat throughout her whole body does have cardiac history did EKG in office when compared to the one done last month no significant changes continue to monitor.      Relevant Orders   EKG 12-Lead (Completed)     No orders of the defined types were placed in this encounter.  This visit occurred during the SARS-CoV-2  public health emergency.  Safety protocols were in place, including screening questions prior to the visit, additional usage of staff PPE, and extensive cleaning of exam room while observing appropriate contact time as indicated for disinfecting solutions.   Romilda Garret, NP

## 2021-03-29 NOTE — Assessment & Plan Note (Signed)
9.  EKG within normal limits compared to labs from cardiologist office.  Continue to monitor we will send in some Flonase as it could be more inner ear type things given she is dealing with a viral infection continue to monitor

## 2021-04-11 NOTE — Progress Notes (Signed)
[facility] [place] Phone: [phone] Subjective:   [name], am serving as a scribe for Dr. Hulan Saas.  This visit occurred during the SARS-CoV-2 public health emergency.  Safety protocols were in place, including screening questions prior to the visit, additional usage of staff PPE, and extensive cleaning of exam room while observing appropriate contact time as indicated for disinfecting solutions.   I'm seeing this patient by the request  of:  [name], MD  CC: Right knee, right shoulder pain follow-up  [id number]  12/01/2020 Patient is doing much better at this time.  No significant change in management.  Do think that patient can transition into doing her own exercises when she is done with physical therapy.  Increase activity slowly.  Follow-up again in 3 to 4 months  Updated 04/12/2021 Lindsey Barrett is a 75 y.o. female coming in with complaint of R knee pain. Patient states that her knee pain is much better. She has good and bad days. Walked 10,000 steps yesterday and her knee feels good today. Also feels good with stairs and housework.       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   Breast cyst    x3   DDD (degenerative disc disease)    Diverticulosis    Fasting hyperglycemia    GERD (gastroesophageal reflux disease)    [name] syndrome    Glaucoma    History of hiatal hernia    HSV infection    Hyperlipidemia    Radiation    left breast 50.4 gray   STD (sexually transmitted disease)    HSV   Past Surgical History:  Procedure Laterality Date   APPENDECTOMY  1961   BREAST CYST ASPIRATION     x2 right ,x1 left   COLONOSCOPY  2007   neg   DILATION AND CURETTAGE OF UTERUS  1975   FLEXIBLE SIGMOIDOSCOPY     x2   PELVIC LAPAROSCOPY      RADIOACTIVE SEED GUIDED PARTIAL MASTECTOMY WITH AXILLARY SENTINEL LYMPH NODE BIOPSY Left 05/31/2015   Procedure: LEFT BREAST RADIOACTIVE SEED GUIDED LUMPECTOMY WITH LEFT AXILLARY SENTINEL LYMPH NODE BIOPSY;  Surgeon: [name], MD;  Location: [facility];  Service: General;  Laterality: Left;   RE-EXCISION OF BREAST LUMPECTOMY Left 07/01/2015   Procedure: RE-EXCISION OF LEFT BREAST INFERIOR MARGIN;  Surgeon: [name], MD;  Location: [facility];  Service: General;  Laterality: Left;   UPPER GASTROINTESTINAL ENDOSCOPY     UPPER GI ENDOSCOPY  10/2010   Dr [name] TOOTH EXTRACTION  1978   Social History   Socioeconomic History   Marital status: Divorced    Spouse name: Not on file   Number of children: 0   Years of education: Not on file   Highest education level: Not on file  Occupational History   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  Tobacco Use   Smoking status: Former    Types: Cigarettes    Quit date: 03/28/1999    Years since quitting: 22.0   Smokeless tobacco: Never   Tobacco comments:    smoked 1967-2000, up to 3/4 ppd  Vaping Use   Vaping Use: Never used  Substance and Sexual Activity   Alcohol use: Yes  Alcohol/week: 10.0 standard drinks    Types: 10 Cans of beer per week   Drug use: No   Sexual activity: Not Currently    Birth control/protection: Post-menopausal    Comment: 1st intercourse 75 yo-More than 5 partners  Other Topics Concern   Not on file  Social History Narrative   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      Regular exercise-no   Social Determinants of Health   Financial Resource Strain: Not on file  Food Insecurity: Not on file  Transportation Needs: Not on file  Physical Activity: Not on file  Stress: Not on file  Social Connections: Not on file   Allergies  Allergen Reactions   Arimidex [Anastrozole] Other (See Comments)    Unable to function, loopy,  dizzy, and mentally fogginess   Latex Rash and Other (See Comments)    Gloves, Band-Aids    Septra Ds [Sulfamethoxazole-Trimethoprim] Hives   Sulfa Antibiotics Hives   Adhesive [Tape] Rash   Augmentin [Amoxicillin-Pot Clavulanate] Diarrhea   Family History  Problem Relation Age of Onset   Lung cancer Mother        smoker   Endometriosis Mother    Cancer Mother        Bladder cancer   Stomach cancer Maternal Grandmother    Coronary artery disease Father    Cancer Father 31       intestinal carcinoid tumors of the ileum   Sudden death Sister        5 week old, congenital deformity   Liver cancer Maternal Uncle    Stroke Paternal Grandfather 71   Leukemia Maternal Uncle    Breast cancer Paternal Aunt        great paternal aunt-Age 41's   Colon cancer Paternal Aunt        lived to 23; pat great aunt   Vascular Disease Paternal Grandmother        thoracic aneurysm   Esophageal cancer Neg Hx    Rectal cancer Neg Hx    Pancreatic cancer Neg Hx      Current Outpatient Medications (Cardiovascular):    ezetimibe (ZETIA) 10 MG tablet, Take 1 tablet by mouth once daily   rosuvastatin (CRESTOR) 40 MG tablet, Take 1 tablet by mouth once daily  Current Outpatient Medications (Respiratory):    benzonatate (TESSALON) 200 MG capsule, Take 1 capsule (200 mg total) by mouth 2 (two) times daily as needed for cough.   fluticasone (FLONASE) 50 MCG/ACT nasal spray, Place 2 sprays into both nostrils daily.  Current Outpatient Medications (Analgesics):    aspirin EC 81 MG tablet, Take 1 tablet (81 mg total) by mouth daily.   ibuprofen (ADVIL) 200 MG tablet, Take 200 mg by mouth every 6 (six) hours as needed.   Current Outpatient Medications (Other):    bimatoprost (LUMIGAN) 0.01 % SOLN, Place 1 drop into both eyes at bedtime.   brinzolamide (AZOPT) 1 % ophthalmic suspension, 1 drop 2 (two) times daily.   calcium carbonate (TUMS - DOSED IN MG ELEMENTAL CALCIUM) 500 MG chewable tablet, Chew 1  tablet by mouth as needed for indigestion or heartburn (1000 mg).    Carboxymethylcellulose Sodium (REFRESH LIQUIGEL OP), Apply 1 drop to eye 4 (four) times daily as needed.   Cholecalciferol (VITAMIN D3 PO), Take 5,000 Int'l Units by mouth daily.    famotidine (PEPCID) 10 MG tablet, Take 1 tablet (10 mg total) by mouth daily. (Patient taking differently: Take  10 mg by mouth. Daily (may take twice daily if needed))   letrozole (FEMARA) 2.5 MG tablet, Take 1 tablet (2.5 mg total) by mouth daily.   sodium chloride (MURO 128) 5 % ophthalmic solution, Place 1 drop into both eyes 2 (two) times a day. Before Azopt   Vaginal Lubricant (REPLENS) GEL, Place 1 application vaginally daily.   Reviewed prior external information including notes and imaging from  primary care provider As well as notes that were available from care everywhere and other healthcare systems.  Past medical history, social, surgical and family history all reviewed in electronic medical record.  No pertanent information unless stated regarding to the chief complaint.   Review of Systems:  No headache, visual changes, nausea, vomiting, diarrhea, constipation, dizziness, abdominal pain, skin rash, fevers, chills, night sweats, weight loss, swollen lymph nodes, body aches, joint swelling, chest pain, shortness of breath, mood changes. POSITIVE muscle aches  Objective  Blood pressure 118/66, pulse 77, height 5' 4.5" (1.638 m), weight 165 lb (74.8 kg), SpO2 97 %.   General: No apparent distress alert and oriented x3 mood and affect normal, dressed appropriately.  HEENT: Pupils equal, extraocular movements intact  Respiratory: Patient's speak in full sentences and does not appear short of breath  Cardiovascular: No lower extremity edema, non tender, no erythema  Gait normal with good balance and coordination.  MSK: Right knee exam does have some lateral tracking noted.  Right shoulder does still have some positive impingement noted.   Scapular dyskinesis noted on the left   Impression and Recommendations:     The above documentation has been reviewed and is accurate and complete Lyndal Pulley, DO

## 2021-04-12 ENCOUNTER — Ambulatory Visit (INDEPENDENT_AMBULATORY_CARE_PROVIDER_SITE_OTHER): Payer: Medicare Other | Admitting: Family Medicine

## 2021-04-12 ENCOUNTER — Encounter: Payer: Self-pay | Admitting: Family Medicine

## 2021-04-12 ENCOUNTER — Other Ambulatory Visit: Payer: Self-pay

## 2021-04-12 DIAGNOSIS — M75111 Incomplete rotator cuff tear or rupture of right shoulder, not specified as traumatic: Secondary | ICD-10-CM | POA: Diagnosis not present

## 2021-04-12 DIAGNOSIS — I2510 Atherosclerotic heart disease of native coronary artery without angina pectoris: Secondary | ICD-10-CM | POA: Diagnosis not present

## 2021-04-12 DIAGNOSIS — M1711 Unilateral primary osteoarthritis, right knee: Secondary | ICD-10-CM

## 2021-04-12 NOTE — Assessment & Plan Note (Signed)
Patient is unremarkable with conservative therapy.  Discussed icing regimen and home exercises.  Did give patient more exercises to work on the scapular strengthening that I think will be helpful.  Discussed icing regimen otherwise.  Follow-up again in 4 to 8 weeks

## 2021-04-12 NOTE — Patient Instructions (Signed)
See me in 3-4 months Scapular exercises If new symptoms you know where I am

## 2021-04-12 NOTE — Assessment & Plan Note (Signed)
Improvement noted.  No significant instability noted.  The patient does have some crepitus noted.  Has been doing the exercises fairly regularly.  Follow-up with me again 6 to 8 weeks

## 2021-04-23 ENCOUNTER — Other Ambulatory Visit: Payer: Self-pay | Admitting: Cardiovascular Disease

## 2021-04-24 ENCOUNTER — Ambulatory Visit (INDEPENDENT_AMBULATORY_CARE_PROVIDER_SITE_OTHER): Payer: Medicare Other

## 2021-04-24 DIAGNOSIS — Z0000 Encounter for general adult medical examination without abnormal findings: Secondary | ICD-10-CM

## 2021-04-24 NOTE — Patient Instructions (Addendum)
[name] , Thank you for taking time to come for your Medicare Wellness Visit. I appreciate your ongoing commitment to your health goals. Please review the following plan we discussed and let me know if I can assist you in the future.   Screening recommendations/referrals: Colonoscopy: 02/13/2016   Mammogram: 06/13/2020 Bone Density: 02/16/2020 Recommended yearly ophthalmology/optometry visit for glaucoma screening and checkup Recommended yearly dental visit for hygiene and checkup  Vaccinations: Influenza vaccine: will obtain 04/25/2021 Pneumococcal vaccine: completed  Tdap vaccine: due  Shingles vaccine: will consider     Advanced directives: on file   Conditions/risks identified: none   Next appointment: 04/25/2021  Dr.Burns    Preventive Care 18 Years and Older, Female Preventive care refers to lifestyle choices and visits with your health care provider that can promote health and wellness. What does preventive care include? A yearly physical exam. This is also called an annual well check. Dental exams once or twice a year. Routine eye exams. Ask your health care provider how often you should have your eyes checked. Personal lifestyle choices, including: Daily care of your teeth and gums. Regular physical activity. Eating a healthy diet. Avoiding tobacco and drug use. Limiting alcohol use. Practicing safe sex. Taking low-dose aspirin every day. Taking vitamin and mineral supplements as recommended by your health care provider. What happens during an annual well check? The services and screenings done by your health care provider during your annual well check will depend on your age, overall health, lifestyle risk factors, and family history of disease. Counseling  Your health care provider may ask you questions about your: Alcohol use. Tobacco use. Drug use. Emotional well-being. Home and relationship well-being. Sexual activity. Eating habits. History of  falls. Memory and ability to understand (cognition). Work and work Statistician. Reproductive health. Screening  You may have the following tests or measurements: Height, weight, and BMI. Blood pressure. Lipid and cholesterol levels. These may be checked every 5 years, or more frequently if you are over 1 years old. Skin check. Lung cancer screening. You may have this screening every year starting at age 24 if you have a 30-pack-year history of smoking and currently smoke or have quit within the past 15 years. Fecal occult blood test (FOBT) of the stool. You may have this test every year starting at age 101. Flexible sigmoidoscopy or colonoscopy. You may have a sigmoidoscopy every 5 years or a colonoscopy every 10 years starting at age 64. Hepatitis C blood test. Hepatitis B blood test. Sexually transmitted disease (STD) testing. Diabetes screening. This is done by checking your blood sugar (glucose) after you have not eaten for a while (fasting). You may have this done every 1-3 years. Bone density scan. This is done to screen for osteoporosis. You may have this done starting at age 61. Mammogram. This may be done every 1-2 years. Talk to your health care provider about how often you should have regular mammograms. Talk with your health care provider about your test results, treatment options, and if necessary, the need for more tests. Vaccines  Your health care provider may recommend certain vaccines, such as: Influenza vaccine. This is recommended every year. Tetanus, diphtheria, and acellular pertussis (Tdap, Td) vaccine. You may need a Td booster every 10 years. Zoster vaccine. You may need this after age 54. Pneumococcal 13-valent conjugate (PCV13) vaccine. One dose is recommended after age 45. Pneumococcal polysaccharide (PPSV23) vaccine. One dose is recommended after age 66. Talk to your health care provider about which screenings  and vaccines you need and how often you need  them. This information is not intended to replace advice given to you by your health care provider. Make sure you discuss any questions you have with your health care provider. Document Released: 05/20/2015 Document Revised: 01/11/2016 Document Reviewed: 02/22/2015 Elsevier Interactive Patient Education  2017 [employer] Prevention in the Home Falls can cause injuries. They can happen to people of all ages. There are many things you can do to make your home safe and to help prevent falls. What can I do on the outside of my home? Regularly fix the edges of walkways and driveways and fix any cracks. Remove anything that might make you trip as you walk through a door, such as a raised step or threshold. Trim any bushes or trees on the path to your home. Use bright outdoor lighting. Clear any walking paths of anything that might make someone trip, such as rocks or tools. Regularly check to see if handrails are loose or broken. Make sure that both sides of any steps have handrails. Any raised decks and porches should have guardrails on the edges. Have any leaves, snow, or ice cleared regularly. Use sand or salt on walking paths during winter. Clean up any spills in your garage right away. This includes oil or grease spills. What can I do in the bathroom? Use night lights. Install grab bars by the toilet and in the tub and shower. Do not use towel bars as grab bars. Use non-skid mats or decals in the tub or shower. If you need to sit down in the shower, use a plastic, non-slip stool. Keep the floor dry. Clean up any water that spills on the floor as soon as it happens. Remove soap buildup in the tub or shower regularly. Attach bath mats securely with double-sided non-slip rug tape. Do not have throw rugs and other things on the floor that can make you trip. What can I do in the bedroom? Use night lights. Make sure that you have a light by your bed that is easy to reach. Do not use  any sheets or blankets that are too big for your bed. They should not hang down onto the floor. Have a firm chair that has side arms. You can use this for support while you get dressed. Do not have throw rugs and other things on the floor that can make you trip. What can I do in the kitchen? Clean up any spills right away. Avoid walking on wet floors. Keep items that you use a lot in easy-to-reach places. If you need to reach something above you, use a strong step stool that has a grab bar. Keep electrical cords out of the way. Do not use floor polish or wax that makes floors slippery. If you must use wax, use non-skid floor wax. Do not have throw rugs and other things on the floor that can make you trip. What can I do with my stairs? Do not leave any items on the stairs. Make sure that there are handrails on both sides of the stairs and use them. Fix handrails that are broken or loose. Make sure that handrails are as long as the stairways. Check any carpeting to make sure that it is firmly attached to the stairs. Fix any carpet that is loose or worn. Avoid having throw rugs at the top or bottom of the stairs. If you do have throw rugs, attach them to the floor with carpet tape.  Make sure that you have a light switch at the top of the stairs and the bottom of the stairs. If you do not have them, ask someone to add them for you. What else can I do to help prevent falls? Wear shoes that: Do not have high heels. Have rubber bottoms. Are comfortable and fit you well. Are closed at the toe. Do not wear sandals. If you use a stepladder: Make sure that it is fully opened. Do not climb a closed stepladder. Make sure that both sides of the stepladder are locked into place. Ask someone to hold it for you, if possible. Clearly mark and make sure that you can see: Any grab bars or handrails. First and last steps. Where the edge of each step is. Use tools that help you move around (mobility aids)  if they are needed. These include: Canes. Walkers. Scooters. Crutches. Turn on the lights when you go into a dark area. Replace any light bulbs as soon as they burn out. Set up your furniture so you have a clear path. Avoid moving your furniture around. If any of your floors are uneven, fix them. If there are any pets around you, be aware of where they are. Review your medicines with your doctor. Some medicines can make you feel dizzy. This can increase your chance of falling. Ask your doctor what other things that you can do to help prevent falls. This information is not intended to replace advice given to you by your health care provider. Make sure you discuss any questions you have with your health care provider. Document Released: 02/17/2009 Document Revised: 09/29/2015 Document Reviewed: 05/28/2014 Elsevier Interactive Patient Education  2017 [employer].

## 2021-04-24 NOTE — Patient Instructions (Addendum)
° °  Flu immunization administered today.     Blood work was ordered.     Medications changes include :   none     Please followup in 1 year

## 2021-04-24 NOTE — Progress Notes (Signed)
Subjective:   Lindsey Barrett is a 75 y.o. female who presents for Medicare Annual (Subsequent) preventive examination.  I connected with Lindsey Barrett today by telephone and verified that I am speaking with the correct person using two identifiers. Location patient: home Location provider: work Persons participating in the virtual visit: patient, provider.   I discussed the limitations, risks, security and privacy concerns of performing an evaluation and management service by telephone and the availability of in person appointments. I also discussed with the patient that there may be a patient responsible charge related to this service. The patient expressed understanding and verbally consented to this telephonic visit.    Interactive audio and video telecommunications were attempted between this provider and patient, however failed, due to patient having technical difficulties OR patient did not have access to video capability.  We continued and completed visit with audio only.    Review of Systems     Cardiac Risk Factors include: advanced age (>33men, >74 women)     Objective:    Today's Vitals   There is no height or weight on file to calculate BMI.  Advanced Directives 04/24/2021 10/31/2020 02/15/2020 01/16/2019 07/14/2018 05/12/2018 01/10/2018  Does Patient Have a Medical Advance Directive? Yes No Yes Yes Yes No Yes  Type of Paramedic of [place];Living will - - [place];Living will [place];Living will - [place];Living will  Does patient want to make changes to medical advance directive? - - No - Patient declined - No - Patient declined - -  Copy of [place] in Chart? Yes - validated most recent copy scanned in chart (See row information) - - Yes - validated most recent copy scanned in chart (See row information) No - copy requested - Yes  Would patient like information  on creating a medical advance directive? - No - Patient declined - - - No - Patient declined -    Current Medications (verified) Outpatient Encounter Medications as of 04/24/2021  Medication Sig   aspirin EC 81 MG tablet Take 1 tablet (81 mg total) by mouth daily.   bimatoprost (LUMIGAN) 0.01 % SOLN Place 1 drop into both eyes at bedtime.   brinzolamide (AZOPT) 1 % ophthalmic suspension 1 drop 2 (two) times daily.   calcium carbonate (TUMS - DOSED IN MG ELEMENTAL CALCIUM) 500 MG chewable tablet Chew 1 tablet by mouth as needed for indigestion or heartburn (1000 mg).    Carboxymethylcellulose Sodium (REFRESH LIQUIGEL OP) Apply 1 drop to eye 4 (four) times daily as needed.   Cholecalciferol (VITAMIN D3 PO) Take 5,000 Int'l Units by mouth daily.    ezetimibe (ZETIA) 10 MG tablet Take 1 tablet by mouth once daily   famotidine (PEPCID) 10 MG tablet Take 1 tablet (10 mg total) by mouth daily. (Patient taking differently: Take 10 mg by mouth. Daily (may take twice daily if needed))   ibuprofen (ADVIL) 200 MG tablet Take 200 mg by mouth every 6 (six) hours as needed.   letrozole (FEMARA) 2.5 MG tablet Take 1 tablet (2.5 mg total) by mouth daily.   rosuvastatin (CRESTOR) 40 MG tablet Take 1 tablet by mouth once daily   sodium chloride (MURO 128) 5 % ophthalmic solution Place 1 drop into both eyes 2 (two) times a day. Before Azopt   Vaginal Lubricant (REPLENS) GEL Place 1 application vaginally daily.   benzonatate (TESSALON) 200 MG capsule Take 1 capsule (200 mg total) by  mouth 2 (two) times daily as needed for cough.   fluticasone (FLONASE) 50 MCG/ACT nasal spray Place 2 sprays into both nostrils daily.   [DISCONTINUED] metoprolol tartrate (LOPRESSOR) 25 MG tablet Take 1 tablet (25 mg total) by mouth as directed. 1 pill every 8 hours as needed for tachycardia or palpitations.   No facility-administered encounter medications on file as of 04/24/2021.    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   Breast cyst    x3   DDD (degenerative disc disease)    Diverticulosis    Fasting hyperglycemia    GERD (gastroesophageal reflux disease)    Lindsey Barrett syndrome    Glaucoma    History of hiatal hernia    HSV infection    Hyperlipidemia    Radiation    left breast 50.4 gray   STD (sexually transmitted disease)    HSV   Past Surgical History:  Procedure Laterality Date   APPENDECTOMY  1961   BREAST CYST ASPIRATION     x2 right ,x1 left   COLONOSCOPY  2007   neg   DILATION AND CURETTAGE OF UTERUS  1975   FLEXIBLE SIGMOIDOSCOPY     x2   PELVIC LAPAROSCOPY     RADIOACTIVE SEED GUIDED PARTIAL MASTECTOMY WITH AXILLARY SENTINEL LYMPH NODE BIOPSY Left 05/31/2015   Procedure: LEFT BREAST RADIOACTIVE SEED GUIDED LUMPECTOMY WITH LEFT AXILLARY SENTINEL LYMPH NODE BIOPSY;  Surgeon: [name], MD;  Location: [facility];  Service: General;  Laterality: Left;   RE-EXCISION OF BREAST LUMPECTOMY Left 07/01/2015   Procedure: RE-EXCISION OF LEFT BREAST INFERIOR MARGIN;  Surgeon: [name], MD;  Location: [facility];  Service: General;  Laterality: Left;   UPPER GASTROINTESTINAL ENDOSCOPY     UPPER GI ENDOSCOPY  10/2010   Dr [name] TOOTH EXTRACTION  1978   Family History  Problem Relation Age of Onset   Lung cancer Mother        smoker   Endometriosis Mother    Cancer Mother        Bladder cancer   Stomach cancer Maternal Grandmother    Coronary artery disease Father    Cancer Father 44       intestinal carcinoid tumors of the ileum   Sudden death Sister        39 week old, congenital deformity   Liver cancer Maternal Uncle     Stroke Paternal Grandfather 54   Leukemia Maternal Uncle    Breast cancer Paternal Aunt        great paternal aunt-Age 73's   Colon cancer Paternal Aunt        lived to 14; pat great aunt   Vascular Disease Paternal Grandmother        thoracic aneurysm   Esophageal cancer Neg Hx    Rectal cancer Neg Hx    Pancreatic cancer Neg Hx    Social History   Socioeconomic History   Marital status: Divorced    Spouse name: Not on file   Number of children: 0   Years of education: Not on file   Highest education level: Not on file  Occupational History   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  Tobacco Use   Smoking status: Former    Types: Cigarettes    Quit date: 03/28/1999    Years since quitting: 22.0   Smokeless tobacco: Never   Tobacco comments:    smoked 1967-2000, up to 3/4 ppd  Vaping Use   Vaping Use: Never used  Substance and Sexual Activity   Alcohol use: Yes    Alcohol/week: 10.0 standard drinks    Types: 10 Cans of beer per week   Drug use: No   Sexual activity: Not Currently    Birth control/protection: Post-menopausal    Comment: 1st intercourse 75 yo-More than 5 partners  Other Topics Concern   Not on file  Social History Narrative   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      Regular exercise-no   Social Determinants of Health   Financial Resource Strain: Low Risk    Difficulty of Paying Living Expenses: Not hard at all  Food Insecurity: No Food Insecurity   Worried About Charity fundraiser in the Last Year: Never true   Arboriculturist in the Last Year: Never true  Transportation Needs: No Transportation Needs   Lack of Transportation (Medical): No   Lack of Transportation (Non-Medical): No  Physical Activity: Insufficiently Active   Days of Exercise per Week: 3 days   Minutes of Exercise per Session: 20 min  Stress: No Stress Concern Present   Feeling of Stress : Not at all  Social Connections: Moderately  Integrated   Frequency of Communication with Friends and Family: Twice a week   Frequency of Social Gatherings with Friends and Family: Twice a week   Attends Religious Services: More than 4 times per year   Active Member of [employer] or Organizations: Yes   Attends Music therapist: More than 4 times per year   Marital Status: Divorced    Tobacco Counseling Counseling given: Not Answered Tobacco comments: smoked 1967-2000, up to 3/4 ppd   Clinical Intake:  Pre-visit preparation completed: Yes  Pain : No/denies pain     Nutritional Risks: None Diabetes: No  How often do you need to have someone help you when you read instructions, pamphlets, or other written materials from your doctor or pharmacy?: 1 - Never What is the last grade level you completed in school?: masters  Diabetic?no  Interpreter Needed?: No  Information entered by :: L.Khaliel Morey,LPN   Activities of Daily Living In your present state of health, do you have any difficulty performing the following activities: 04/24/2021  Hearing? N  Vision? N  Difficulty concentrating or making decisions? N  Walking or climbing stairs? N  Dressing or bathing? N  Doing errands, shopping? N  Preparing Food and eating ? N  Using the Toilet? N  In the past six months, have you accidently leaked urine? N  Do you have problems with loss of bowel control? N  Managing your Medications? N  Managing your Finances? N  Housekeeping or managing your Housekeeping? N  Some recent data might be hidden    Patient Care Team: [name], MD as PCP - General (Internal Medicine) Fontaine, Belinda Block, MD (Inactive) as Consulting Physician (Gynecology) Armbruster, Carlota Raspberry, MD as Consulting Physician (Gastroenterology) Calvert Cantor, MD as Consulting Physician (Ophthalmology) Delice Bison Darnelle Maffucci, [facility] as Pharmacist (Pharmacist)  Indicate any recent Medical Services you may have received from other than [facility] providers in the past  year (date may be approximate).  Assessment:   This is a routine wellness examination for Lindsey Barrett.  Hearing/Vision screen Vision Screening - Comments:: Annual eye exams wears glasses   Dietary issues and exercise activities discussed: Current Exercise Habits: Home exercise routine, Type of exercise: walking, Time (Minutes): 20, Frequency (Times/Week): 4, Weekly Exercise (Minutes/Week): 80, Intensity: Mild   Goals Addressed             This Visit's Progress    Patient Stated   On track    Lose weight by monitor my diet closer, use portion control, increase the amount of exercise I do. Change my sleep pattern by going to bed earlier and getting up earlier.       Depression Screen PHQ 2/9 Scores 04/24/2021 04/24/2021 02/15/2020 01/16/2019 01/10/2018 01/01/2017 10/27/2015  PHQ - 2 Score 0 0 0 1 0 0 0  PHQ- 9 Score - - - - 2 2 -  Exception Documentation - - - - - - -    Fall Risk Fall Risk  04/24/2021 02/15/2020 01/16/2019 01/10/2018 01/01/2017  Falls in the past year? 0 0 0 No No  Number falls in past yr: 0 0 0 - -  Injury with Fall? 0 0 0 - -  Risk for fall due to : - No Fall Risks - - -  Follow up Falls evaluation completed Falls evaluation completed - - -    FALL RISK PREVENTION PERTAINING TO THE HOME:  Any stairs in or around the home? Yes  If so, are there any without handrails? No  Home free of loose throw rugs in walkways, pet beds, electrical cords, etc? Yes  Adequate lighting in your home to reduce risk of falls? Yes   ASSISTIVE DEVICES UTILIZED TO PREVENT FALLS:  Life alert? No  Use of a cane, walker or w/c? No  Grab bars in the bathroom? Yes  Shower chair or bench in shower? Yes  Elevated toilet seat or a handicapped toilet? Yes    Cognitive Function:  Normal cognitive status assessed by direct observation by this Nurse Health Advisor. No abnormalities found.     6CIT Screen 02/15/2020 01/16/2019  What Year? 0 points -  What month? 0 points 0 points   What time? 0 points 0 points  Count back from 20 0 points 0 points  Months in reverse 0 points 0 points  Repeat phrase 0 points 0 points  Total Score 0 -    Immunizations Immunization History  Administered Date(s) Administered   Fluad Quad(high Dose 65+) 01/22/2019, 02/15/2020   Hepatitis A, Adult 02/17/2016, 07/11/2018, 01/16/2019   Influenza Split 04/18/2011   Influenza, High Dose Seasonal PF 05/26/2013, 01/27/2016, 02/26/2017, 02/05/2018   Influenza, Seasonal, Injecte, Preservative Fre 04/17/2012   PFIZER Comirnaty(Gray Top)Covid-19 Tri-Sucrose Vaccine 12/13/2020   PFIZER(Purple Top)SARS-COV-2 Vaccination 06/12/2019, 07/07/2019, 03/19/2020   Pneumococcal Conjugate-13 02/17/2016   Pneumococcal Polysaccharide-23 07/02/2012   Tdap 08/02/2010    TDAP status: Due, Education has been provided regarding the importance of this vaccine. Advised may receive this vaccine at local pharmacy or Health Dept. Aware to provide a copy of the vaccination record if obtained from local pharmacy or Health Dept. Verbalized acceptance and understanding.  Flu Vaccine status: Up to date  Pneumococcal vaccine status: Up to date  Covid-19 vaccine status: Completed vaccines  Qualifies for Shingles Vaccine? Yes   Zostavax completed No   Shingrix Completed?: No.    Education has been provided regarding the importance of this vaccine. Patient has been advised to call insurance company to  determine out of pocket expense if they have not yet received this vaccine. Advised may also receive vaccine at local pharmacy or Health Dept. Verbalized acceptance and understanding.  Screening Tests Health Maintenance  Topic Date Due   Zoster Vaccines- Shingrix (1 of 2) Never done   TETANUS/TDAP  08/01/2020   INFLUENZA VACCINE  12/05/2020   COVID-19 Vaccine (5 - Booster for Pfizer series) 02/07/2021   COLONOSCOPY (Pts 45-77yrs Insurance coverage will need to be confirmed)  02/12/2021   MAMMOGRAM  06/13/2021   DEXA  SCAN  02/16/2023   Pneumonia Vaccine 6+ Years old  Completed   Hepatitis C Screening  Completed   HPV VACCINES  Aged Out    Health Maintenance  Health Maintenance Due  Topic Date Due   Zoster Vaccines- Shingrix (1 of 2) Never done   TETANUS/TDAP  08/01/2020   INFLUENZA VACCINE  12/05/2020   COVID-19 Vaccine (5 - Booster for [place] series) 02/07/2021   COLONOSCOPY (Pts 45-58yrs Insurance coverage will need to be confirmed)  02/12/2021    Colorectal cancer screening: Type of screening: Colonoscopy. Completed 02/13/2016. Repeat every 7 years  Mammogram status: Completed 06/13/2020. Repeat every year  Bone Density status: Completed 02/16/2020. Results reflect: Bone density results: OSTEOPENIA. Repeat every 5 years.  Lung Cancer Screening: (Low Dose CT Chest recommended if Age 41-80 years, 30 pack-year currently smoking OR have quit w/in 15years.) does not qualify.   Lung Cancer Screening Referral: n/a  Additional Screening:  Hepatitis C Screening: does not qualify; Completed 08/10/2015  Vision Screening: Recommended annual ophthalmology exams for early detection of glaucoma and other disorders of the eye. Is the patient up to date with their annual eye exam?  Yes  Who is the provider or what is the name of the office in which the patient attends annual eye exams? [facility]  If pt is not established with a provider, would they like to be referred to a provider to establish care? No .   Dental Screening: Recommended annual dental exams for proper oral hygiene  Community Resource Referral / Chronic Care Management: CRR required this visit?  No   CCM required this visit?  No      Plan:     I have personally reviewed and noted the following in the patients chart:   Medical and social history Use of alcohol, tobacco or illicit drugs  Current medications and supplements including opioid prescriptions.  Functional ability and status Nutritional status Physical  activity Advanced directives List of other physicians Hospitalizations, surgeries, and ER visits in previous 12 months Vitals Screenings to include cognitive, depression, and falls Referrals and appointments  In addition, I have reviewed and discussed with patient certain preventive protocols, quality metrics, and best practice recommendations. A written personalized care plan for preventive services as well as general preventive health recommendations were provided to patient.     Randel Pigg, LPN   [id number]   Nurse Notes: none

## 2021-04-24 NOTE — Progress Notes (Signed)
Subjective:    Patient ID: Lindsey Barrett, female    DOB: [birth year], 75 y.o.   MRN: [id number]  This visit occurred during the SARS-CoV-2 public health emergency.  Safety protocols were in place, including screening questions prior to the visit, additional usage of staff PPE, and extensive cleaning of exam room while observing appropriate contact time as indicated for disinfecting solutions.     HPI The patient is here for follow up of their chronic medical problems, including hld, gerd, breast cancer, prediabetes  She is taking all of her medications as prescribed.  She was seen just before Thanksgiving for cold symptoms.  It was not COVID or the flu.  She was not prescribed any medication except for symptomatic relief and her symptoms eventually got better, but lingered for a while.  Recently she has noticed intermittent symptoms where she feels like it still hanging on a little bit with some drainage, congestion at times and sore throat at times.  She has intermittent lightheadedness/off balance but denies spinning sensation.  If often occurs when she does not eat and/or drink for a while. She has felt it today too and did eat and drink.  She is not sure what is causing it.    Medications and allergies reviewed with patient and updated if appropriate.  Patient Active Problem List   Diagnosis Date Noted   Acute cough 03/29/2021   Light headedness 03/29/2021   Sore throat 03/29/2021   Pounding heartbeat 03/29/2021   Dermatitis 12/09/2020   Low back pain 10/18/2020   COVID-19 08/31/2020   Patellofemoral arthritis of right knee 08/25/2020   Left wrist pain 04/19/2020   Left leg pain 04/18/2020   Acute pain of right knee 03/09/2020   Sensorineural hearing loss (SNHL) of both ears 03/20/2019   Deviated nasal septum 03/02/2019   Pulmonary nodule 07/29/2018   Dupuytren's contracture of both hands 07/11/2018   Ganglion cyst of volar aspect of left wrist 06/04/2018   Frozen  shoulder 03/18/2018   Epistaxis 02/05/2018   Right rotator cuff tear 10/15/2017   Tinnitus of both ears 01/01/2017   Reaction to internal stress 05/29/2016   Prediabetes 08/10/2015   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[place]) 05/20/2015   Open-angle glaucoma 06/17/2014   Vitamin D deficiency 10/18/2011   HSV infection    Atrophic vaginitis    VARICOSE VEINS, LOWER EXTREMITIES 09/28/2009   GERD 07/14/2009   Hyperlipidemia 12/26/2006   GILBERT'S SYNDROME 08/09/2006   DEGENERATIVE [place] DISEASE 08/09/2006    Current Outpatient Medications on File Prior to Visit  Medication Sig Dispense Refill   aspirin EC 81 MG tablet Take 1 tablet (81 mg total) by mouth daily.     bimatoprost (LUMIGAN) 0.01 % SOLN Place 1 drop into both eyes at bedtime.     brinzolamide (AZOPT) 1 % ophthalmic suspension 1 drop 2 (two) times daily.     calcium carbonate (TUMS - DOSED IN MG ELEMENTAL CALCIUM) 500 MG chewable tablet Chew 1 tablet by mouth as needed for indigestion or heartburn (1000 mg).      Carboxymethylcellulose Sodium (REFRESH LIQUIGEL OP) Apply 1 drop to eye 4 (four) times daily as needed.     Cholecalciferol (VITAMIN D3 PO) Take 5,000 Int'l Units by mouth daily.      ezetimibe (ZETIA) 10 MG tablet Take 1 tablet by mouth once daily 90 tablet 2   famotidine (PEPCID) 10 MG tablet Take 1 tablet (10 mg total) by  mouth daily. (Patient taking differently: Take 10 mg by mouth. Daily (may take twice daily if needed)) 90 tablet 3   ibuprofen (ADVIL) 200 MG tablet Take 200 mg by mouth every 6 (six) hours as needed.     letrozole (FEMARA) 2.5 MG tablet Take 1 tablet (2.5 mg total) by mouth daily. 90 tablet 3   sodium chloride (MURO 128) 5 % ophthalmic solution Place 1 drop into both eyes 2 (two) times a day. Before Azopt     Vaginal Lubricant (REPLENS) GEL Place 1 application vaginally daily.     [DISCONTINUED] metoprolol tartrate (LOPRESSOR) 25 MG tablet Take 1 tablet (25 mg total) by  mouth as directed. 1 pill every 8 hours as needed for tachycardia or palpitations. 30 tablet 2   No current facility-administered medications on file prior to visit.    Past Medical History:  Diagnosis Date   Anxiety    Atrophic vaginitis    Breast cancer ([place])    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[place]) 05/20/2015   Breast cyst    x3   DDD (degenerative disc disease)    Diverticulosis    Fasting hyperglycemia    GERD (gastroesophageal reflux disease)    [name] syndrome    Glaucoma    History of hiatal hernia    HSV infection    Hyperlipidemia    Radiation    left breast 50.4 gray   STD (sexually transmitted disease)    HSV    Past Surgical History:  Procedure Laterality Date   APPENDECTOMY  1961   BREAST CYST ASPIRATION     x2 right ,x1 left   COLONOSCOPY  2007   neg   DILATION AND CURETTAGE OF UTERUS  1975   FLEXIBLE SIGMOIDOSCOPY     x2   PELVIC LAPAROSCOPY     RADIOACTIVE SEED GUIDED PARTIAL MASTECTOMY WITH AXILLARY SENTINEL LYMPH NODE BIOPSY Left 05/31/2015   Procedure: LEFT BREAST RADIOACTIVE SEED GUIDED LUMPECTOMY WITH LEFT AXILLARY SENTINEL LYMPH NODE BIOPSY;  Surgeon: [name], MD;  Location: [facility];  Service: General;  Laterality: Left;   RE-EXCISION OF BREAST LUMPECTOMY Left 07/01/2015   Procedure: RE-EXCISION OF LEFT BREAST INFERIOR MARGIN;  Surgeon: [name], MD;  Location: [facility];  Service: General;  Laterality: Left;   UPPER GASTROINTESTINAL ENDOSCOPY     UPPER GI ENDOSCOPY  10/2010   Dr [name] TOOTH EXTRACTION  1978    Social History   Socioeconomic History   Marital status: Divorced    Spouse name: Not on file   Number of children: 0   Years of education: Not on file   Highest education level: Not on file  Occupational History   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  Tobacco Use   Smoking status: Former    Types: Cigarettes    Quit date:  03/28/1999    Years since quitting: 22.0   Smokeless tobacco: Never   Tobacco comments:    smoked 1967-2000, up to 3/4 ppd  Vaping Use   Vaping Use: Never used  Substance and Sexual Activity   Alcohol use: Yes    Alcohol/week: 10.0 standard drinks    Types: 10 Cans of beer per week   Drug use: No   Sexual activity: Not Currently    Birth control/protection: Post-menopausal    Comment: 1st intercourse 75 yo-More than 5 partners  Other Topics Concern   Not on file  Social History Narrative  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      Regular exercise-no   Social Determinants of Health   Financial Resource Strain: Low Risk    Difficulty of Paying Living Expenses: Not hard at all  Food Insecurity: No Food Insecurity   Worried About Charity fundraiser in the Last Year: Never true   Arboriculturist in the Last Year: Never true  Transportation Needs: No Transportation Needs   Lack of Transportation (Medical): No   Lack of Transportation (Non-Medical): No  Physical Activity: Insufficiently Active   Days of Exercise per Week: 3 days   Minutes of Exercise per Session: 20 min  Stress: No Stress Concern Present   Feeling of Stress : Not at all  Social Connections: Moderately Integrated   Frequency of Communication with Friends and Family: Twice a week   Frequency of Social Gatherings with Friends and Family: Twice a week   Attends Religious Services: More than 4 times per year   Active Member of [employer] or Organizations: Yes   Attends Music therapist: More than 4 times per year   Marital Status: Divorced    Family History  Problem Relation Age of Onset   Lung cancer Mother        smoker   Endometriosis Mother    Cancer Mother        Bladder cancer   Stomach cancer Maternal Grandmother    Coronary artery disease Father    Cancer Father 63       intestinal carcinoid tumors of the ileum   Sudden death Sister        67 week old, congenital  deformity   Liver cancer Maternal Uncle    Stroke Paternal Grandfather 61   Leukemia Maternal Uncle    Breast cancer Paternal Aunt        great paternal aunt-Age 79's   Colon cancer Paternal Aunt        lived to 77; pat great aunt   Vascular Disease Paternal Grandmother        thoracic aneurysm   Esophageal cancer Neg Hx    Rectal cancer Neg Hx    Pancreatic cancer Neg Hx     Review of Systems  Constitutional:  Negative for fever.  HENT:  Positive for postnasal drip.   Respiratory:  Positive for cough (related to PND). Negative for shortness of breath and wheezing.   Cardiovascular:  Negative for chest pain, palpitations and leg swelling.  Neurological:  Positive for light-headedness and headaches (occ temple HA or left eye pain - tol dit was from dry eyes). Negative for dizziness.      Objective:   Vitals:   04/25/21 1501  BP: 120/70  Pulse: 83  Temp: 98 F (36.7 C)  SpO2: 97%   BP Readings from Last 3 Encounters:  04/25/21 120/70  04/12/21 118/66  03/29/21 124/68   Wt Readings from Last 3 Encounters:  04/25/21 166 lb 3.2 oz (75.4 kg)  04/12/21 165 lb (74.8 kg)  03/29/21 165 lb 6 oz (75 kg)   Body mass index is 28.09 kg/m.   Physical Exam    Constitutional: Appears well-developed and well-nourished. No distress.  HENT:  Head: Normocephalic and atraumatic.  Neck: Neck supple. No tracheal deviation present. No thyromegaly present.  No cervical lymphadenopathy Cardiovascular: Normal rate, regular rhythm and normal heart sounds.   No murmur heard. No carotid bruit .  No edema Pulmonary/Chest: Effort  normal and breath sounds normal. No respiratory distress. No has no wheezes. No rales.  Abdomen: soft, NT, ND Skin: Skin is warm and dry. Not diaphoretic.  Psychiatric: Normal mood and affect. Behavior is normal.      Assessment & Plan:    See Problem List for Assessment and Plan of chronic medical problems.

## 2021-04-25 ENCOUNTER — Encounter: Payer: Self-pay | Admitting: Internal Medicine

## 2021-04-25 ENCOUNTER — Ambulatory Visit (INDEPENDENT_AMBULATORY_CARE_PROVIDER_SITE_OTHER): Payer: Medicare Other | Admitting: Internal Medicine

## 2021-04-25 ENCOUNTER — Other Ambulatory Visit: Payer: Self-pay

## 2021-04-25 VITALS — BP 120/70 | HR 83 | Temp 98.0°F | Ht 64.5 in | Wt 166.2 lb

## 2021-04-25 DIAGNOSIS — K219 Gastro-esophageal reflux disease without esophagitis: Secondary | ICD-10-CM

## 2021-04-25 DIAGNOSIS — I2510 Atherosclerotic heart disease of native coronary artery without angina pectoris: Secondary | ICD-10-CM

## 2021-04-25 DIAGNOSIS — E782 Mixed hyperlipidemia: Secondary | ICD-10-CM | POA: Diagnosis not present

## 2021-04-25 DIAGNOSIS — R7303 Prediabetes: Secondary | ICD-10-CM

## 2021-04-25 DIAGNOSIS — Z23 Encounter for immunization: Secondary | ICD-10-CM

## 2021-04-25 MED ORDER — ROSUVASTATIN CALCIUM 40 MG PO TABS
40.0000 mg | ORAL_TABLET | Freq: Every day | ORAL | 3 refills | Status: DC
  Filled 2021-12-28: qty 90, 90d supply, fill #0

## 2021-04-25 NOTE — Assessment & Plan Note (Signed)
Chronic Check lipid panel  Continue zetia 10 mg daily, crestor 40 mg daily Regular exercise and healthy diet encouraged  

## 2021-04-25 NOTE — Addendum Note (Signed)
Addended by: Marcina Millard on: 04/25/2021 05:06 PM   Modules accepted: Orders

## 2021-04-25 NOTE — Assessment & Plan Note (Signed)
Chronic Check a1c Low sugar / carb diet Stressed regular exercise  

## 2021-04-25 NOTE — Assessment & Plan Note (Signed)
Chronic GERD controlled Continue pepcid 10 mg daily

## 2021-04-26 ENCOUNTER — Encounter: Payer: Self-pay | Admitting: Internal Medicine

## 2021-05-18 ENCOUNTER — Telehealth: Payer: Medicare Other

## 2021-05-25 ENCOUNTER — Encounter: Payer: Self-pay | Admitting: Internal Medicine

## 2021-05-25 DIAGNOSIS — Z20822 Contact with and (suspected) exposure to covid-19: Secondary | ICD-10-CM | POA: Diagnosis not present

## 2021-05-26 ENCOUNTER — Encounter: Payer: Self-pay | Admitting: Cardiovascular Disease

## 2021-05-26 ENCOUNTER — Encounter: Payer: Self-pay | Admitting: Internal Medicine

## 2021-05-26 ENCOUNTER — Other Ambulatory Visit: Payer: Self-pay

## 2021-05-26 ENCOUNTER — Other Ambulatory Visit (INDEPENDENT_AMBULATORY_CARE_PROVIDER_SITE_OTHER): Payer: Medicare Other

## 2021-05-26 ENCOUNTER — Encounter: Payer: Self-pay | Admitting: Family Medicine

## 2021-05-26 DIAGNOSIS — K219 Gastro-esophageal reflux disease without esophagitis: Secondary | ICD-10-CM | POA: Diagnosis not present

## 2021-05-26 DIAGNOSIS — E782 Mixed hyperlipidemia: Secondary | ICD-10-CM | POA: Diagnosis not present

## 2021-05-26 DIAGNOSIS — R7303 Prediabetes: Secondary | ICD-10-CM | POA: Diagnosis not present

## 2021-05-26 LAB — LIPID PANEL
Cholesterol: 169 mg/dL (ref 0–200)
HDL: 50.9 mg/dL (ref >39.00)
LDL Cholesterol: 97 mg/dL (ref 0–99)
NonHDL: 118.13
Total CHOL/HDL Ratio: 3
Triglycerides: 104 mg/dL (ref 0.0–149.0)
VLDL: 20.8 mg/dL (ref 0.0–40.0)

## 2021-05-26 LAB — CBC WITH DIFFERENTIAL/PLATELET
Basophils Absolute: 0 10*3/uL (ref 0.0–0.1)
Basophils Relative: 0.4 % (ref 0.0–3.0)
Eosinophils Absolute: 0.3 10*3/uL (ref 0.0–0.7)
Eosinophils Relative: 4.6 % (ref 0.0–5.0)
HCT: 40.4 % (ref 36.0–46.0)
Hemoglobin: 13.2 g/dL (ref 12.0–15.0)
Lymphocytes Relative: 41.8 % (ref 12.0–46.0)
Lymphs Abs: 2.9 10*3/uL (ref 0.7–4.0)
MCHC: 32.8 g/dL (ref 30.0–36.0)
MCV: 87.5 fl (ref 78.0–100.0)
Monocytes Absolute: 0.8 10*3/uL (ref 0.1–1.0)
Monocytes Relative: 11 % (ref 3.0–12.0)
Neutro Abs: 3 10*3/uL (ref 1.4–7.7)
Neutrophils Relative %: 42.2 % — ABNORMAL LOW (ref 43.0–77.0)
Platelets: 218 10*3/uL (ref 150.0–400.0)
RBC: 4.62 Mil/uL (ref 3.87–5.11)
RDW: 14.3 % (ref 11.5–15.5)
WBC: 7 10*3/uL (ref 4.0–10.5)

## 2021-05-26 LAB — COMPREHENSIVE METABOLIC PANEL
ALT: 16 U/L (ref 0–35)
AST: 20 U/L (ref 0–37)
Albumin: 4.1 g/dL (ref 3.5–5.2)
Alkaline Phosphatase: 48 U/L (ref 39–117)
BUN: 12 mg/dL (ref 6–23)
CO2: 30 mEq/L (ref 19–32)
Calcium: 8.8 mg/dL (ref 8.4–10.5)
Chloride: 106 mEq/L (ref 96–112)
Creatinine, Ser: 0.66 mg/dL (ref 0.40–1.20)
GFR: 86.73 mL/min (ref >60.00)
Glucose, Bld: 100 mg/dL — ABNORMAL HIGH (ref 70–99)
Potassium: 4 mEq/L (ref 3.5–5.1)
Sodium: 141 mEq/L (ref 135–145)
Total Bilirubin: 1.5 mg/dL — ABNORMAL HIGH (ref 0.2–1.2)
Total Protein: 6.7 g/dL (ref 6.0–8.3)

## 2021-05-26 LAB — HEMOGLOBIN A1C: Hgb A1c MFr Bld: 6.1 % (ref 4.6–6.5)

## 2021-06-12 ENCOUNTER — Ambulatory Visit (INDEPENDENT_AMBULATORY_CARE_PROVIDER_SITE_OTHER): Payer: Medicare Other | Admitting: Family Medicine

## 2021-06-12 ENCOUNTER — Other Ambulatory Visit: Payer: Self-pay

## 2021-06-12 ENCOUNTER — Encounter: Payer: Self-pay | Admitting: Family Medicine

## 2021-06-12 VITALS — BP 120/64 | HR 83 | Ht 64.5 in | Wt 166.6 lb

## 2021-06-12 DIAGNOSIS — M25531 Pain in right wrist: Secondary | ICD-10-CM | POA: Diagnosis not present

## 2021-06-12 DIAGNOSIS — M545 Low back pain, unspecified: Secondary | ICD-10-CM

## 2021-06-12 NOTE — Patient Instructions (Addendum)
Thank you for coming in today.   I've referred you to Physical Therapy.  Let us know if you don't hear from them in one week.   Try using a heating pad  For your wrist: Please use Voltaren gel (Generic Diclofenac Gel) up to 4x daily for pain as needed.  This is available over-the-counter as both the name brand Voltaren gel and the generic diclofenac gel.   Recheck back in 1 month  TENS UNIT: This is helpful for muscle pain and spasm.   Search and Purchase a TENS 7000 2nd edition at  www.tenspros.com or www.[employer].com It should be less than $30.     TENS unit instructions: Do not shower or bathe with the unit on Turn the unit off before removing electrodes or batteries If the electrodes lose stickiness add a drop of water to the electrodes after they are disconnected from the unit and place on plastic sheet. If you continued to have difficulty, call the TENS unit company to purchase more electrodes. Do not apply lotion on the skin area prior to use. Make sure the skin is clean and dry as this will help prolong the life of the electrodes. After use, always check skin for unusual red areas, rash or other skin difficulties. If there are any skin problems, does not apply electrodes to the same area. Never remove the electrodes from the unit by pulling the wires. Do not use the TENS unit or electrodes other than as directed. Do not change electrode placement without consultating your therapist or physician. Keep 2 fingers with between each electrode. Wear time ratio is 2:1, on to off times.    For example on for 30 minutes off for 15 minutes and then on for 30 minutes off for 15 minutes

## 2021-06-12 NOTE — Progress Notes (Signed)
I, Wendy Poet, LAT, ATC, am serving as scribe for Dr. Lynne Leader.  Lindsey Barrett is a 76 y.o. female who [facility] to [facility] today for back pain. Pt was previously seen by Dr. Tamala Julian on 04/12/21 for her R knee and R shoulder and for her low back on 10/18/20. Pt was previously treated w/ PT at [facility], completing 8 visits. Today, pt c/o back pain since Friday evening.  She had moved furniture earlier that week.   Pt locates pain to her midline low back .  Movement of any kind and transitions from supine-to-sit and sitting to standing worsens her pain.  She used ice.  She denies any radiating pain into her legs.  She also c/o of R radial-sided wrist pain.  Pain is located just proximal to the radial styloid and is sharp.  She no longer is hurting currently however pain was worse earlier yesterday.  She was holding a book at that time.  Has not had any treatment yet.   Dx imaging: 10/18/20 L-spine XR  Pertinent review of systems: No fevers or chills  Relevant historical information: Breast cancer   Exam:  BP 120/64 (BP Location: Right Arm, Patient Position: Sitting, Cuff Size: Normal)    Pulse 83    Ht 5' 4.5" (1.638 m)    Wt 166 lb 9.6 oz (75.6 kg)    SpO2 96%    BMI 28.16 kg/m  General: Well Developed, well nourished, and in no acute distress.   MSK: L-spine: Nontender midline.  Tender palpation paraspinal musculature. Decreased lumbar motion.  Pain with standing and back extension and flexion. Lower extremity strength is intact.  Right wrist normal-appearing Not particularly tender at area of pain at distal radius and radial styloid. Normal thumb and wrist motion. Negative Finkelstein's test. Intact wrist strength.    Lab and Radiology Results EXAM: LUMBAR SPINE - 2-3 VIEW   COMPARISON:  None.   FINDINGS: Normal alignment.  No fracture or mass.   Mild disc degeneration L2-3 and L3-4. Moderate disc degeneration L4-5 with disc  space narrowing and spurring. Mild disc space narrowing L5-S1.   Ligament ossification extends from the L4-5 disc space to the iliac bone bilaterally. This may be the iliolumbar ligament which is ossified.   IMPRESSION: Lumbar disc degeneration without acute abnormality   Ossification of the iliolumbar ligament bilaterally     Electronically Signed   By: Franchot Gallo M.D.   On: 10/19/2020 09:14   I, Lynne Leader, personally (independently) visualized and performed the interpretation of the images attached in this note.      Assessment and Plan: 76 y.o. female with  Acute low back pain worse with motion is very consistent with muscle spasm and dysfunction around the lumbar spine.  Suspect multifidi based on the location and quality of her pain.  She does have degenerative changes seen on her lumbar x-ray from June 2022 which could be contributory.  She is an excellent candidate for physical therapy for this issue.  Plan for physical therapy and recheck in about 6 weeks especially if not improved.  At that time would deftly recommend repeat lumbar x-ray and potentially MRI. Also recommend heating pad and TENS unit.  We discussed muscle relaxers and she declined.  Right wrist pain.  Etiology is unclear at this time.  Her pain has improved a fair amount by time she presented to clinic.  I suspect she may have had a little bit of  intersection syndrome based on the location of her pain.  However currently she is not hurting so is hard to tell.  Watchful waiting with Voltaren gel and perhaps thumb spica wrist brace if needed.  Recheck as needed.    PDMP not reviewed this encounter. Orders Placed This Encounter  Procedures   Ambulatory referral to Physical Therapy    Referral Priority:   Routine    Referral Type:   Physical Medicine    Referral Reason:   Specialty Services Required    Requested Specialty:   Physical Therapy    Number of Visits Requested:   1   No orders of the  defined types were placed in this encounter.    Discussed warning signs or symptoms. Please see discharge instructions. Patient expresses understanding.  The above documentation has been reviewed and is accurate and complete Lynne Leader, M.D.

## 2021-06-14 ENCOUNTER — Ambulatory Visit: Payer: Medicare Other | Attending: Family Medicine | Admitting: Physical Therapy

## 2021-06-14 ENCOUNTER — Other Ambulatory Visit: Payer: Self-pay

## 2021-06-14 ENCOUNTER — Encounter: Payer: Self-pay | Admitting: Physical Therapy

## 2021-06-14 DIAGNOSIS — M545 Low back pain, unspecified: Secondary | ICD-10-CM | POA: Diagnosis not present

## 2021-06-14 DIAGNOSIS — M25531 Pain in right wrist: Secondary | ICD-10-CM | POA: Diagnosis not present

## 2021-06-14 DIAGNOSIS — M62831 Muscle spasm of calf: Secondary | ICD-10-CM | POA: Diagnosis not present

## 2021-06-14 NOTE — Therapy (Signed)
[place] [place], [place], [address] Phone: [phone]   Fax:  [phone]  Physical Therapy Evaluation  Patient Details  Name: Lindsey Barrett MRN: [id number] Date of Birth: 08/13/1945 Referring Provider (PT): Georgina Snell   Encounter Date: 06/14/2021   PT End of Session - 06/14/21 1738     Visit Number 1    Date for PT Re-Evaluation 09/11/21    Authorization Type Medicare    PT Start Time 1700    PT Stop Time 1750    PT Time Calculation (min) 50 min    Activity Tolerance Patient limited by pain    Behavior During Therapy [facility] for tasks assessed/performed             Past Medical History:  Diagnosis Date   Anxiety    Atrophic vaginitis    Breast cancer ([place])    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[place]) 05/20/2015   Breast cyst    x3   DDD (degenerative disc disease)    Diverticulosis    Fasting hyperglycemia    GERD (gastroesophageal reflux disease)    [name] syndrome    Glaucoma    History of hiatal hernia    HSV infection    Hyperlipidemia    Radiation    left breast 50.4 gray   STD (sexually transmitted disease)    HSV    Past Surgical History:  Procedure Laterality Date   APPENDECTOMY  1961   BREAST CYST ASPIRATION     x2 right ,x1 left   COLONOSCOPY  2007   neg   DILATION AND CURETTAGE OF UTERUS  1975   FLEXIBLE SIGMOIDOSCOPY     x2   PELVIC LAPAROSCOPY     RADIOACTIVE SEED GUIDED PARTIAL MASTECTOMY WITH AXILLARY SENTINEL LYMPH NODE BIOPSY Left 05/31/2015   Procedure: LEFT BREAST RADIOACTIVE SEED GUIDED LUMPECTOMY WITH LEFT AXILLARY SENTINEL LYMPH NODE BIOPSY;  Surgeon: [name], MD;  Location: [facility];  Service: General;  Laterality: Left;   RE-EXCISION OF BREAST LUMPECTOMY Left 07/01/2015   Procedure: RE-EXCISION OF LEFT BREAST INFERIOR MARGIN;  Surgeon: [name], MD;  Location: [facility];  Service:  General;  Laterality: Left;   UPPER GASTROINTESTINAL ENDOSCOPY     UPPER GI ENDOSCOPY  10/2010   Dr [name] TOOTH EXTRACTION  1978    There were no vitals filed for this visit.    Subjective Assessment - 06/14/21 1710     Subjective Patient reports about 2 weeks ago she was moving furniture.  She reports that on Saturday she woke up in a lot of pain in the mid low back.  Past x-rays show DDD  She reports much worse in the AM    Limitations Sitting;Standing;House hold activities    How long can you stand comfortably? pain    How long can you walk comfortably? pain and a lot of difficutly    Patient Stated Goals have less pain and spasm    Currently in Pain? Yes    Pain Score 4     Pain Location Back    Pain Orientation Lower    Pain Descriptors / Indicators Sharp;Aching    Pain Type Acute pain    Pain Radiating Towards no    Pain Onset 1 to 4 weeks ago    Pain Frequency Constant    Aggravating Factors  morning, walking, standing up straight pain up to 8/10  Pain Relieving Factors as the day goes on it gets a little better, has pain at best a 4/10    Effect of Pain on Daily Activities limits everything                [facility] PT Assessment - 06/14/21 0001       Assessment   Medical Diagnosis LBP, wrist pain    Referring Provider (PT) Georgina Snell    Onset Date/Surgical Date 05/31/21    Prior Therapy in the past for knees and back      Precautions   Precautions None      Balance Screen   Has the patient fallen in the past 6 months No    Has the patient had a decrease in activity level because of a fear of falling?  No    Is the patient reluctant to leave their home because of a fear of falling?  No      Home Environment   Additional Comments 2 story home, does housework, does Haematologist and gardening      Prior Function   Level of Independence Independent    Vocation Retired    Leisure not much exercise      Posture/Postural Control   Posture Comments forward  flexed trunk with standing and walking      ROM / Strength   AROM / PROM / Strength AROM;Strength      AROM   Overall AROM Comments lumbar ROM was limited 75% mostly due to fear, some pain in the low back      Strength   Overall Strength Comments hips and legs 4-/5 with pain in the low back      Palpation   Palpation comment lumbar pspinals very tight, but non tender, some tendernss over the L3-S1 area      Ambulation/Gait   Gait Comments when getting up she is very forward flexed and has a hard time standing up straight due to pain                        Objective measurements completed on examination: See above findings.       [facility] Adult PT Treatment/Exercise - 06/14/21 0001       Modalities   Modalities Electrical Stimulation;Moist Heat      Moist Heat Therapy   Number Minutes Moist Heat 10 Minutes    Moist Heat Location Lumbar Spine      Electrical Stimulation   Electrical Stimulation Location L/S area    Electrical Stimulation Action IFC    Electrical Stimulation Parameters supine    Electrical Stimulation Goals Pain                       PT Short Term Goals - 06/14/21 1742       PT SHORT TERM GOAL #1   Title I with HEP    Time 2    Period Weeks    Status New               PT Long Term Goals - 06/14/21 1743       PT LONG TERM GOAL #1   Title be able to stand up and walk without stooped posture    Time 12    Period Weeks    Status New      PT LONG TERM GOAL #2   Title Patient able to perform ADLS without limitation in ROM  Time 12    Period Weeks    Status New      PT LONG TERM GOAL #3   Title Patient able to perform ADLs with no pain.    Time 12    Period Weeks    Status New      PT LONG TERM GOAL #4   Title increase lumbar ROM to WFL's    Time 12    Period Weeks    Status New      PT LONG TERM GOAL #5   Title independent with advanced gym program    Time 12    Period Weeks    Status New                     Plan - 06/14/21 1740     Clinical Impression Statement Patient was moving furniture about two weeks ago, she did not have pain at that time but reports that this past weekend she woke up with significant low back pain, unsure of truly what the cause is, she does have a histroy of DDD.  She has significant spasms in the lumbar area, denies N/T, mild tenderness L4-S1 over the spinous processes.  Movements hurt, worse in teh AM, has difficulty standing up straight to walk, tends to be forward flexed due to pain    Stability/Clinical Decision Making Evolving/Moderate complexity    Clinical Decision Making Low    Rehab Potential Good    PT Frequency 2x / week    PT Duration 12 weeks    PT Treatment/Interventions ADLs/Self Care Home Management;Cryotherapy;Electrical Stimulation;Moist Heat;Traction;Functional mobility training;Therapeutic activities;Therapeutic exercise;Balance training;Neuromuscular re-education;Manual techniques;Patient/family education;Dry needling    PT Next Visit Plan gave HEP, start to move and try to help with pain    Consulted and Agree with Plan of Care Patient             Patient will benefit from skilled therapeutic intervention in order to improve the following deficits and impairments:  Abnormal gait, Decreased range of motion, Difficulty walking, Increased muscle spasms, Decreased activity tolerance, Pain, Improper body mechanics, Impaired flexibility, Decreased mobility, Decreased strength  Visit Diagnosis: Acute bilateral low back pain without sciatica - Plan: PT plan of care cert/re-cert  Muscle spasm of calf - Plan: PT plan of care cert/re-cert  Pain in right wrist - Plan: PT plan of care cert/re-cert     Problem List Patient Active Problem List   Diagnosis Date Noted   Acute cough 03/29/2021   Light headedness 03/29/2021   Sore throat 03/29/2021   Pounding heartbeat 03/29/2021   Dermatitis 12/09/2020   Low back pain  10/18/2020   COVID-19 08/31/2020   Patellofemoral arthritis of right knee 08/25/2020   Left wrist pain 04/19/2020   Left leg pain 04/18/2020   Acute pain of right knee 03/09/2020   Sensorineural hearing loss (SNHL) of both ears 03/20/2019   Deviated nasal septum 03/02/2019   Pulmonary nodule 07/29/2018   Dupuytren's contracture of both hands 07/11/2018   Ganglion cyst of volar aspect of left wrist 06/04/2018   Frozen shoulder 03/18/2018   Right rotator cuff tear 10/15/2017   Tinnitus of both ears 01/01/2017   Reaction to internal stress 05/29/2016   Prediabetes 08/10/2015   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[place]) 05/20/2015   Open-angle glaucoma 06/17/2014   Vitamin D deficiency 10/18/2011   HSV infection    Atrophic vaginitis    VARICOSE VEINS, LOWER EXTREMITIES 09/28/2009   GERD  07/14/2009   Hyperlipidemia 12/26/2006   GILBERT'S SYNDROME 08/09/2006   DEGENERATIVE [place] DISEASE 08/09/2006    Sumner Boast, PT 06/14/2021, 5:47 PM  [place]. [place], [place], [address] Phone: [phone]   Fax:  [phone]  Name: Lindsey Barrett MRN: [id number] Date of Birth: Jan 25, 1946

## 2021-06-14 NOTE — Patient Instructions (Signed)
Access Code: [email or web address] URL: [email or web address] Date: 06/14/2021 Prepared by: Lum Babe  Exercises Hooklying Single Knee to Chest Stretch - 2 x daily - 7 x weekly - 1 sets - 10 reps - 5-10 hold Supine Double Knee to Chest - 2 x daily - 7 x weekly - 1 sets - 10 reps - 5-10 hold Supine Lower Trunk Rotation - 2 x daily - 7 x weekly - 1 sets - 10 reps - 5-10 hold

## 2021-06-15 DIAGNOSIS — Z1231 Encounter for screening mammogram for malignant neoplasm of breast: Secondary | ICD-10-CM | POA: Diagnosis not present

## 2021-06-16 DIAGNOSIS — Z20822 Contact with and (suspected) exposure to covid-19: Secondary | ICD-10-CM | POA: Diagnosis not present

## 2021-06-19 ENCOUNTER — Encounter: Payer: Self-pay | Admitting: Obstetrics and Gynecology

## 2021-06-22 ENCOUNTER — Ambulatory Visit: Payer: Medicare Other | Admitting: Physical Therapy

## 2021-06-22 ENCOUNTER — Encounter: Payer: Self-pay | Admitting: Physical Therapy

## 2021-06-22 ENCOUNTER — Other Ambulatory Visit: Payer: Self-pay

## 2021-06-22 DIAGNOSIS — M62831 Muscle spasm of calf: Secondary | ICD-10-CM | POA: Diagnosis not present

## 2021-06-22 DIAGNOSIS — M545 Low back pain, unspecified: Secondary | ICD-10-CM | POA: Diagnosis not present

## 2021-06-22 DIAGNOSIS — M25531 Pain in right wrist: Secondary | ICD-10-CM

## 2021-06-22 NOTE — Therapy (Signed)
[place]. [place], [place], [address] Phone: [phone]   Fax:  [phone]  Physical Therapy Treatment  Patient Details  Name: Lindsey Barrett MRN: [id number] Date of Birth: [birth year] Referring Provider (PT): Georgina Snell   Encounter Date: 06/22/2021   PT End of Session - 06/22/21 1656     Visit Number 2    Date for PT Re-Evaluation 09/11/21    Authorization Type Medicare    PT Start Time 1615    PT Stop Time 44    PT Time Calculation (min) 56 min    Activity Tolerance Patient limited by pain    Behavior During Therapy [facility] for tasks assessed/performed             Past Medical History:  Diagnosis Date   Anxiety    Atrophic vaginitis    Breast cancer ([place])    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/2015   Breast cyst    x3   DDD (degenerative disc disease)    Diverticulosis    Fasting hyperglycemia    GERD (gastroesophageal reflux disease)    [name] syndrome    Glaucoma    History of hiatal hernia    HSV infection    Hyperlipidemia    Radiation    left breast 50.4 gray   STD (sexually transmitted disease)    HSV    Past Surgical History:  Procedure Laterality Date   APPENDECTOMY  1961   BREAST CYST ASPIRATION     x2 right ,x1 left   COLONOSCOPY  2007   neg   DILATION AND CURETTAGE OF UTERUS  1975   FLEXIBLE SIGMOIDOSCOPY     x2   PELVIC LAPAROSCOPY     RADIOACTIVE SEED GUIDED PARTIAL MASTECTOMY WITH AXILLARY SENTINEL LYMPH NODE BIOPSY Left 05/31/2015   Procedure: LEFT BREAST RADIOACTIVE SEED GUIDED LUMPECTOMY WITH LEFT AXILLARY SENTINEL LYMPH NODE BIOPSY;  Surgeon: [name], MD;  Location: [facility];  Service: General;  Laterality: Left;   RE-EXCISION OF BREAST LUMPECTOMY Left 07/01/2015   Procedure: RE-EXCISION OF LEFT BREAST INFERIOR MARGIN;  Surgeon: [name], MD;  Location: [facility];  Service:  General;  Laterality: Left;   UPPER GASTROINTESTINAL ENDOSCOPY     UPPER GI ENDOSCOPY  10/2010   Dr [name] TOOTH EXTRACTION  1978    There were no vitals filed for this visit.   Subjective Assessment - 06/22/21 1625     Subjective PAtient reports htat she really felt good for a few days after the last treatment, she reports that the past two days she has been doing a lot of stuff around the house and has to bend over a lot and is hurting more    Currently in Pain? Yes    Pain Score 6     Pain Location Back    Pain Orientation Lower    Pain Descriptors / Indicators Aching;Sore    Pain Relieving Factors the last treatment really was great                               [facility] Adult PT Treatment/Exercise - 06/22/21 0001       Therapeutic Activites    Therapeutic Activities ADL's;Lifting    ADL's vacuum, foot prop    Lifting golfer's lift, power lift, weight close and pivot      Exercises  Exercises Lumbar      Lumbar Exercises: Stretches   Passive Hamstring Stretch Right;Left;4 reps;20 seconds    Single Knee to Chest Stretch Right;Left;3 reps;10 seconds    Lower Trunk Rotation 4 reps;10 seconds    Piriformis Stretch Right;Left;4 reps;20 seconds      Lumbar Exercises: Supine   Bridge with Ball Squeeze 10 reps;1 second    Other Supine Lumbar Exercises feet on ball K2C, trunk rotation, small bridges and isometric abs      Moist Heat Therapy   Number Minutes Moist Heat 10 Minutes    Moist Heat Location Lumbar Spine      Electrical Stimulation   Electrical Stimulation Location L/S area    Electrical Stimulation Action IFC    Electrical Stimulation Parameters supine    Electrical Stimulation Goals Pain                       PT Short Term Goals - 06/22/21 1658       PT SHORT TERM GOAL #1   Title I with HEP    Status Partially Met               PT Long Term Goals - 06/14/21 1743       PT LONG TERM GOAL #1   Title be  able to stand up and walk without stooped posture    Time 12    Period Weeks    Status New      PT LONG TERM GOAL #2   Title Patient able to perform ADLS without limitation in ROM    Time 12    Period Weeks    Status New      PT LONG TERM GOAL #3   Title Patient able to perform ADLs with no pain.    Time 12    Period Weeks    Status New      PT LONG TERM GOAL #4   Title increase lumbar ROM to WFL's    Time 12    Period Weeks    Status New      PT LONG TERM GOAL #5   Title independent with advanced gym program    Time 12    Period Weeks    Status New                   Plan - 06/22/21 1656     Clinical Impression Statement Patient reports that she felt really good for a few days after the last treatment.  She reports that the past two days she has been doing a lot of stuff around the house and her back is hurting much worse.  She is tight, I added a lot of education about lifting and body mechanics for ADL's, added supine exercises.  and stretches    PT Next Visit Plan progress as tolerated    Consulted and Agree with Plan of Care Patient             Patient will benefit from skilled therapeutic intervention in order to improve the following deficits and impairments:  Abnormal gait, Decreased range of motion, Difficulty walking, Increased muscle spasms, Decreased activity tolerance, Pain, Improper body mechanics, Impaired flexibility, Decreased mobility, Decreased strength  Visit Diagnosis: Acute bilateral low back pain without sciatica  Muscle spasm of calf  Pain in right wrist     Problem List Patient Active Problem List   Diagnosis Date Noted   Acute cough 03/29/2021  Light headedness 03/29/2021   Sore throat 03/29/2021   Pounding heartbeat 03/29/2021   Dermatitis 12/09/2020   Low back pain 10/18/2020   COVID-19 08/31/2020   Patellofemoral arthritis of right knee 08/25/2020   Left wrist pain 04/19/2020   Left leg pain 04/18/2020   Acute  pain of right knee 03/09/2020   Sensorineural hearing loss (SNHL) of both ears 03/20/2019   Deviated nasal septum 03/02/2019   Pulmonary nodule 07/29/2018   Dupuytren's contracture of both hands 07/11/2018   Ganglion cyst of volar aspect of left wrist 06/04/2018   Frozen shoulder 03/18/2018   Right rotator cuff tear 10/15/2017   Tinnitus of both ears 01/01/2017   Reaction to internal stress 05/29/2016   Prediabetes 08/10/2015   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[place]) 05/20/2015   Open-angle glaucoma 06/17/2014   Vitamin D deficiency 10/18/2011   HSV infection    Atrophic vaginitis    VARICOSE VEINS, LOWER EXTREMITIES 09/28/2009   GERD 07/14/2009   Hyperlipidemia 12/26/2006   GILBERT'S SYNDROME 08/09/2006   DEGENERATIVE DISC DISEASE 08/09/2006    Sumner Boast, PT 06/22/2021, 4:59 PM  [place]. [place], [place], [address] Phone: [phone]   Fax:  [phone]  Name: Lindsey Barrett MRN: [id number] Date of Birth: 1945-08-09

## 2021-06-26 ENCOUNTER — Ambulatory Visit: Payer: Medicare Other | Admitting: Physical Therapy

## 2021-06-26 DIAGNOSIS — Z853 Personal history of malignant neoplasm of breast: Secondary | ICD-10-CM | POA: Diagnosis not present

## 2021-06-26 LAB — HM MAMMOGRAPHY

## 2021-06-27 ENCOUNTER — Ambulatory Visit (INDEPENDENT_AMBULATORY_CARE_PROVIDER_SITE_OTHER): Payer: Medicare Other

## 2021-06-27 DIAGNOSIS — K219 Gastro-esophageal reflux disease without esophagitis: Secondary | ICD-10-CM

## 2021-06-27 DIAGNOSIS — E782 Mixed hyperlipidemia: Secondary | ICD-10-CM

## 2021-06-27 NOTE — Patient Instructions (Signed)
Visit Information  Following are the goals we discussed today:   Manage My Medicine   Timeframe:  Long-Range Goal Priority:  High Start Date:  11/17/2020                          Expected End Date:  06/30/2022                    Follow Up Date 06/2022   - call for medicine refill 2 or 3 days before it runs out - call if I am sick and can't take my medicine - keep a list of all the medicines I take; vitamins and herbals too - use a pillbox to sort medicine    Why is this important?   These steps will help you keep on track with your medicines.  Plan: Telephone follow up appointment with care management team member scheduled for:  12 months  The patient has been provided with contact information for the care management team and has been advised to call with any health related questions or concerns.   [name], PharmD Clinical Pharmacist, [facility]   Please call the care guide team at [phone] if you need to cancel or reschedule your appointment.   Patient verbalizes understanding of instructions and care plan provided today and agrees to view in [name]. Active MyChart status confirmed with patient.

## 2021-06-27 NOTE — Progress Notes (Signed)
Chronic Care Management Pharmacy Note  06/27/2021 Name:  Lindsey Barrett MRN:  [id number] DOB:  1945/08/19  Summary: - Patient is doing well, last lab results were stable, patient reports adherence to current medications  -Had questions regarding administration of her medications and how much she should space tums from her medications - advised separating tums at least 2 hours prior to any other medications  -Encouraged patient to trial 2nd dose of famotidine should GERD be uncontrolled    Recommendations/Changes made from today's visit: - Recommending no changes to medications, patient to reach out to office regarding any medication issues or concerns -f/u in 12 months   Subjective: Lindsey Barrett is an 76 y.o. year old female who is a primary patient of Burns, Claudina Lick, MD.  The CCM team was consulted for assistance with disease management and care coordination needs.    Engaged with patient by telephone for follow up visit in response to provider referral for pharmacy case management and/or care coordination services.   Consent to Services:  The patient was given the following information about Chronic Care Management services today, agreed to services, and gave verbal consent: 1. CCM service includes personalized support from designated clinical staff supervised by the primary care provider, including individualized plan of care and coordination with other care providers 2. 24/7 contact phone numbers for assistance for urgent and routine care needs. 3. Service will only be billed when office clinical staff spend 20 minutes or more in a month to coordinate care. 4. Only one practitioner may furnish and bill the service in a calendar month. 5.The patient may stop CCM services at any time (effective at the end of the month) by phone call to the office staff. 6. The patient will be responsible for cost sharing (co-pay) of up to 20% of the service fee (after annual deductible is met).  Patient agreed to services and consent obtained.  Patient Care Team: [name], MD as PCP - General (Internal Medicine) Phineas Real, Belinda Block, MD (Inactive) as Consulting Physician (Gynecology) Armbruster, Carlota Raspberry, MD as Consulting Physician (Gastroenterology) Calvert Cantor, MD as Consulting Physician (Ophthalmology) Delice Bison Darnelle Maffucci, [facility] as Pharmacist (Pharmacist)  Recent office visits: 04/25/2021 -Dr. Quay Burow - flu shot given, no changes to medications - f/u in 1 year  03/29/2021 - Karl Ito NP - acute cough, sore throat - rx'd fluticasone and benzonatate    Recent consult visits: 06/12/2021 - Dr. Georgina Snell - Sports Medicine - back pain - PT for 6 weeks, follow up if not improved  04/12/2021 - Dr. Tamala Julian - Sports Medicine - right knee, right shoulder pain - conservative treatment for 6-8 weeks - f/u in 3-4 months if needed  02/28/2021 -Dr. Claiborne Billings - Cardiology - no changes to medications - f/u in 12 months  01/06/2021 - Dr. Talbert Nan - OB/GYN - no changes to medications    Hospital visits: None in previous 6 months  Objective:  Lab Results  Component Value Date   CREATININE 0.66 05/26/2021   BUN 12 05/26/2021   GFR 86.73 05/26/2021   GFRNONAA 90 11/04/2019   GFRAA 104 11/04/2019   NA 141 05/26/2021   K 4.0 05/26/2021   CALCIUM 8.8 05/26/2021   CO2 30 05/26/2021   GLUCOSE 100 (H) 05/26/2021    Lab Results  Component Value Date/Time   HGBA1C 6.1 05/26/2021 08:58 AM   HGBA1C 6.2 02/19/2020 09:32 AM   GFR 86.73 05/26/2021 08:58 AM   GFR 88.08 10/18/2020 01:15 PM  MICROALBUR 0.3 12/18/2006 09:53 AM    Last diabetic Eye exam:  No results found for: HMDIABEYEEXA  Last diabetic Foot exam:  No results found for: HMDIABFOOTEX   Lab Results  Component Value Date   CHOL 169 05/26/2021   HDL 50.90 05/26/2021   LDLCALC 97 05/26/2021   LDLDIRECT 148.9 09/23/2009   TRIG 104.0 05/26/2021   CHOLHDL 3 05/26/2021    Hepatic Function Latest Ref Rng & Units 05/26/2021 10/18/2020  02/19/2020  Total Protein 6.0 - 8.3 g/dL 6.7 7.1 7.0  Albumin 3.5 - 5.2 g/dL 4.1 4.3 4.1  AST 0 - 37 U/L _0 ALT 0 - 35 U/L _1 Alk Phosphatase 39 - 117 U/L 48 51 53  Total Bilirubin 0.2 - 1.2 mg/dL 1.5(H) 1.7(H) 1.3(H)  Bilirubin, Direct 0.0 - 0.3 mg/dL - - -    Lab Results  Component Value Date/Time   TSH 1.79 10/18/2020 01:15 PM   TSH 2.920 11/04/2019 10:05 AM    CBC Latest Ref Rng & Units 05/26/2021 10/18/2020 11/04/2019  WBC 4.0 - 10.5 K/uL 7.0 7.1 8.0  Hemoglobin 12.0 - 15.0 g/dL 13.2 13.6 14.0  Hematocrit 36.0 - 46.0 % 40.4 40.8 43.8  Platelets 150.0 - 400.0 K/uL 218.0 255.0 246    Lab Results  Component Value Date/Time   VD25OH 75.78 10/18/2020 01:15 PM   VD25OH 51 09/04/2013 09:28 AM   VD25OH 44 01/01/2012 09:29 AM    Clinical ASCVD: No  The 10-year ASCVD risk score (Arnett DK, et al., 2019) is: 11.3%   Values used to calculate the score:     Age: 27 years     Sex: Female     Is Non-Hispanic African American: No     Diabetic: No     Tobacco smoker: No     Systolic Blood Pressure: 088 mmHg     Is BP treated: No     HDL Cholesterol: 50.9 mg/dL     Total Cholesterol: 169 mg/dL    Depression screen [facility] 2/9 04/24/2021 04/24/2021 02/15/2020  Decreased Interest 0 0 0  Down, Depressed, Hopeless 0 0 0  PHQ - 2 Score 0 0 0  Altered sleeping - - -  Tired, decreased energy - - -  Change in appetite - - -  Feeling bad or failure about yourself  - - -  Trouble concentrating - - -  Moving slowly or fidgety/restless - - -  Suicidal thoughts - - -  PHQ-9 Score - - -  Difficult doing work/chores - - -  Some recent data might be hidden    Social History   Tobacco Use  Smoking Status Former   Types: Cigarettes   Quit date: 03/28/1999   Years since quitting: 22.2  Smokeless Tobacco Never  Tobacco Comments   smoked 1967-2000, up to 3/4 ppd   BP Readings from Last 3 Encounters:  06/12/21 120/64  04/25/21 120/70  04/12/21 118/66   Pulse Readings  from Last 3 Encounters:  06/12/21 83  04/25/21 83  04/12/21 77   Wt Readings from Last 3 Encounters:  06/12/21 166 lb 9.6 oz (75.6 kg)  04/25/21 166 lb 3.2 oz (75.4 kg)  04/12/21 165 lb (74.8 kg)   BMI Readings from Last 3 Encounters:  06/12/21 28.16 kg/m  04/25/21 28.09 kg/m  04/12/21 27.88 kg/m    Assessment/Interventions: Review of patient past medical history, allergies, medications, health status, including review of consultants reports, laboratory and other test data, was performed  as part of comprehensive evaluation and provision of chronic care management services.   SDOH:  (Social Determinants of Health) assessments and interventions performed: Yes  SDOH Screenings   Alcohol Screen: Low Risk    Last Alcohol Screening Score (AUDIT): 1  Depression (PHQ2-9): Low Risk    PHQ-2 Score: 0  Financial Resource Strain: Low Risk    Difficulty of Paying Living Expenses: Not hard at all  Food Insecurity: No Food Insecurity   Worried About Charity fundraiser in the Last Year: Never true   Ran Out of Food in the Last Year: Never true  Housing: Low Risk    Last Housing Risk Score: 0  Physical Activity: Insufficiently Active   Days of Exercise per Week: 3 days   Minutes of Exercise per Session: 20 min  Social Connections: Moderately Integrated   Frequency of Communication with Friends and Family: Twice a week   Frequency of Social Gatherings with Friends and Family: Twice a week   Attends Religious Services: More than 4 times per year   Active Member of [employer] or Organizations: Yes   Attends Music therapist: More than 4 times per year   Marital Status: Divorced  Stress: No Stress Concern Present   Feeling of Stress : Not at all  Tobacco Use: Medium Risk   Smoking Tobacco Use: Former   Smokeless Tobacco Use: Never   Passive Exposure: Not on Pensions consultant Needs: No Transportation Needs   Lack of Transportation (Medical): No   Lack of Transportation  (Non-Medical): No    CCM Care Plan  Allergies  Allergen Reactions   Arimidex [Anastrozole] Other (See Comments)    Unable to function, loopy, dizzy, and mentally fogginess   Latex Rash and Other (See Comments)    Gloves, Band-Aids    Septra Ds [Sulfamethoxazole-Trimethoprim] Hives   Sulfa Antibiotics Hives   Adhesive [Tape] Rash   Augmentin [Amoxicillin-Pot Clavulanate] Diarrhea    Medications Reviewed Today     Reviewed by [name], [facility] (Pharmacist) on 06/27/21 at 1124  Med List Status: <None>   Medication Order Taking? Sig Documenting Provider Last Dose Status Informant  aspirin EC 81 MG tablet [id number] Yes Take 1 tablet (81 mg total) by mouth daily. Nicholas Lose, MD Taking Active   bimatoprost (LUMIGAN) 0.01 % SOLN [id number] Yes Place 1 drop into both eyes at bedtime. [provider] Taking Active Self  brinzolamide (AZOPT) 1 % ophthalmic suspension [id number] Yes 1 drop 2 (two) times daily. [provider] Taking Active Self  calcium carbonate (TUMS - DOSED IN MG ELEMENTAL CALCIUM) 500 MG chewable tablet [id number] Yes Chew 1 tablet by mouth as needed for indigestion or heartburn (1000 mg).  [provider] Taking Active Self  Carboxymethylcellulose Sodium (REFRESH LIQUIGEL OP) [id number] Yes Apply 1 drop to eye 4 (four) times daily as needed. [provider] Taking Active   Cholecalciferol (VITAMIN D3 PO) [id number] Yes Take 5,000 Int'l Units by mouth daily.  [provider] Taking Active Self  ezetimibe (ZETIA) 10 MG tablet [id number] Yes Take 1 tablet by mouth once daily Troy Sine, MD Taking Active   famotidine (PEPCID) 10 MG tablet [id number] Yes Take 1 tablet (10 mg total) by mouth daily.  Patient taking differently: Take 10 mg by mouth. Daily (may take twice daily if needed)   Armbruster, Carlota Raspberry, MD Taking Active   ibuprofen (ADVIL) 200 MG tablet [id number] No Take 200 mg by mouth every 6 (six) hours as  needed.   Patient not taking: Reported on 06/12/2021   [provider] Not Taking Active   letrozole [facility]) 2.5 MG tablet [id number] Yes Take 1 tablet (2.5 mg total) by mouth daily. Nicholas Lose, MD Taking Active     Discontinued 07/12/11 0455 (Discontinued by provider)   rosuvastatin (CRESTOR) 40 MG tablet [id number] Yes Take 1 tablet (40 mg total) by mouth daily. [name], MD Taking Active   sodium chloride (MURO 128) 5 % ophthalmic solution [id number] Yes Place 1 drop into both eyes 2 (two) times a day. Before Azopt [provider] Taking Active   Vaginal Lubricant (REPLENS) GEL [id number]  Place 1 application vaginally daily. [provider]  Active             Patient Active Problem List   Diagnosis Date Noted   Acute cough 03/29/2021   Light headedness 03/29/2021   Sore throat 03/29/2021   Pounding heartbeat 03/29/2021   Dermatitis 12/09/2020   Low back pain 10/18/2020   COVID-19 08/31/2020   Patellofemoral arthritis of right knee 08/25/2020   Left wrist pain 04/19/2020   Left leg pain 04/18/2020   Acute pain of right knee 03/09/2020   Sensorineural hearing loss (SNHL) of both ears 03/20/2019   Deviated nasal septum 03/02/2019   Pulmonary nodule 07/29/2018   Dupuytren's contracture of both hands 07/11/2018   Ganglion cyst of volar aspect of left wrist 06/04/2018   Frozen shoulder 03/18/2018   Right rotator cuff tear 10/15/2017   Tinnitus of both ears 01/01/2017   Reaction to internal stress 05/29/2016   Prediabetes 08/10/2015   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[place]) 05/20/2015   Open-angle glaucoma 06/17/2014   Vitamin D deficiency 10/18/2011   HSV infection    Atrophic vaginitis    VARICOSE VEINS, LOWER EXTREMITIES 09/28/2009   GERD 07/14/2009   Hyperlipidemia 12/26/2006   GILBERT'S SYNDROME 08/09/2006   DEGENERATIVE [place] DISEASE 08/09/2006    Immunization History  Administered Date(s) Administered   Fluad  Quad(high Dose 65+) 01/22/2019, 02/15/2020, 04/25/2021   Hepatitis A, Adult 02/17/2016, 07/11/2018, 01/16/2019   Influenza Split 04/18/2011   Influenza, High Dose Seasonal PF 05/26/2013, 01/27/2016, 02/26/2017, 02/05/2018   Influenza, Seasonal, Injecte, Preservative Fre 04/17/2012   PFIZER Comirnaty(Gray Top)Covid-19 Tri-Sucrose Vaccine 12/13/2020   PFIZER(Purple Top)SARS-COV-2 Vaccination 06/12/2019, 07/07/2019, 03/19/2020   Pneumococcal Conjugate-13 02/17/2016   Pneumococcal Polysaccharide-23 07/02/2012   Tdap 08/02/2010    Conditions to be addressed/monitored:  Hyperlipidemia, Glaucoma, Breast Cancer, and GERD  Care Plan : CCM Care Plan  Updates made by [name], RPH since 06/27/2021 12:00 AM     Problem: Hyperlipidemia, Glaucoma, Breast Cancer, and GERD   Priority: High  Onset Date: 11/17/2020     Long-Range Goal: Disease Management   Start Date: 11/17/2020  Expected End Date: 06/27/2022  This Visit's Progress: On track  Recent Progress: On track  Priority: High  Note:   Current Barriers:  Unable to independently monitor therapeutic efficacy  Pharmacist Clinical Goal(s):  Patient will maintain control of LDL and stomach acid as evidenced by next lipid panel use of famotidine and tums  through collaboration with PharmD and provider.   Interventions: 1:1 collaboration with [name], MD regarding development and update of comprehensive plan of care as evidenced by provider attestation and co-signature Inter-disciplinary care team collaboration (see longitudinal plan of care) Comprehensive medication review performed; medication list updated in electronic medical record  Hyperlipidemia: (LDL goal < 100) -Controlled - stable  Lab Results  Component Value Date   LDLCALC 97 05/26/2021  -Current treatment: Rosuvastatin 21m - 1 tablet daily  Ezetimibe 148m- 1 tablet daily  Aspirin 8186m 1 tablet daily  -Medications previously tried: n/a  -Current dietary  patterns: reports that she does eat red meats/ fried or fatty foods on occasion, but that she has worked to reduce portion size -Current exercise habits: working with PT, [facility] will be [facility] continue home [facility]  -Educated on Cholesterol goals;  Benefits of statin for ASCVD risk reduction; Importance of limiting foods high in cholesterol; Exercise goal of 150 minutes per week; Strategies to manage statin-induced myalgias; -Counseled on diet and exercise extensively Recommended to continue current medication  Prediabetes (A1c goal <6.5%) -Controlled Lab Results  Component Value Date   HGBA1C 6.1 05/26/2021  -Current medications: N/a - diet controlled  -Medications previously tried: n/a  -Current meal patterns:  Patient reports that she has been working to lose weight, has made changes to portion sizes, through diet and exercise has been able to lose 12lbs since the start of the pandemic  -Current exercise: working with PT, [facility]  -Educated on A1c and blood sugar goals; Complications of diabetes including kidney damage, retinal damage, and cardiovascular disease; Exercise goal of 150 minutes per week; Benefits of weight loss; -Counseled to check feet daily and get yearly eye exams -Counseled on diet and exercise extensively   GERD (Goal: Acid control/ symptom prevention) -Controlled -Current treatment  Famotidine 19m63m1 tablet daily, 1 additional tablet daily if needed  Tums - 1 tablet if needed  -Medications previously tried: omeprazole, aciphex, ranitidine  -Counseled on diet and exercise extensively Recommended to continue current medication  Breast Cancer (Goal: Treatment of cancer/ prevention of disease progression) -Controlled -Current treatment  Letrozole 2.5mg 27m tablet daily  -Medications previously tried: anastrozole    -Recommended to continue current medication  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Gluacoma (Goal: Occular Pressure  Control) ([facility] -Current treatment  Azopt 1% ophthalmic solution - 1 drop into each eye twice daily  Lumigan 0.01% - 1 drop into each eye at bedtime Mura 128 5% ophthalmic solution - 1 drop into each eye twice daily  Refresh ophthalmic solution - 1 drop into each eye 4 times daily as needed  -Medications previously tried: isopto tears   -Recommended to continue current medication  Health Maintenance -Vaccine gaps: Shingles, Tdap, COVID Booster -Current therapy:  Vitamin D3 - 5000 units daily  Ibuprofen 200mg 65mtablet every 6 hours if needed (takes infrequently) -Educated on Cost vs benefit of each product must be carefully weighed by individual consumer -Patient is satisfied with current therapy and denies issues -Recommended to continue current medication  Patient Goals/Self-Care Activities Patient will:  - take medications as prescribed target a minimum of 150 minutes of moderate intensity exercise weekly engage in dietary modifications by continuing to monitor portion control and try to reduce red meat / fried and fatty food intake   Follow Up Plan: Telephone follow up appointment with care management team member scheduled for: 12 months The patient has been provided with contact information for the care management team and has been advised to call with any health related questions or concerns.         Medication Assistance: None required.  Patient affirms current coverage meets needs.  Patient's preferred pharmacy is:  [facility] [address]  [place] [place] [place] [address] Phone: [phone] Fax: [place], [place] - [address] [place] [address] Phone: [phone] Fax: [phone]  [facility] [address] [place], [place]. [address] [place]. [place] [address] Phone: [phone] Fax: [phone]  [id number] [place] [address] [address]: [phone]: [phone]  Pt endorses 100% compliance  Care Plan and Follow Up Patient Decision:  Patient agrees to Care Plan and Follow-up.  Plan: Telephone follow up appointment with care management team member scheduled for:  6 months and The patient has been provided with contact information for the care management team and has been advised to call with any health related questions or concerns.   DTomasa Blase PharmD Clinical Pharmacist, [place]

## 2021-06-28 ENCOUNTER — Encounter: Payer: Self-pay | Admitting: Obstetrics and Gynecology

## 2021-07-04 DIAGNOSIS — E785 Hyperlipidemia, unspecified: Secondary | ICD-10-CM | POA: Diagnosis not present

## 2021-07-04 DIAGNOSIS — H401190 Primary open-angle glaucoma, unspecified eye, stage unspecified: Secondary | ICD-10-CM

## 2021-07-05 ENCOUNTER — Encounter: Payer: Self-pay | Admitting: Internal Medicine

## 2021-07-05 NOTE — Progress Notes (Signed)
Outside notes received. Information abstracted. Notes sent to scan.  

## 2021-07-10 ENCOUNTER — Ambulatory Visit: Payer: Medicare Other | Admitting: Physical Therapy

## 2021-07-10 ENCOUNTER — Ambulatory Visit: Payer: Medicare Other | Attending: Family Medicine | Admitting: Physical Therapy

## 2021-07-10 ENCOUNTER — Other Ambulatory Visit: Payer: Self-pay

## 2021-07-10 ENCOUNTER — Encounter: Payer: Self-pay | Admitting: Physical Therapy

## 2021-07-10 DIAGNOSIS — M25531 Pain in right wrist: Secondary | ICD-10-CM | POA: Diagnosis not present

## 2021-07-10 DIAGNOSIS — M62831 Muscle spasm of calf: Secondary | ICD-10-CM | POA: Insufficient documentation

## 2021-07-10 DIAGNOSIS — M545 Low back pain, unspecified: Secondary | ICD-10-CM | POA: Insufficient documentation

## 2021-07-10 NOTE — Therapy (Signed)
[facility] ?[place] ?[address] ?[place], [place], [address] ?Phone: [phone]   Fax:  [phone] ? ?Physical Therapy Treatment ? ?Patient Details  ?Name: Lindsey Barrett ?MRN: [id number] ?Date of Birth: Jul 14, 1945 ?Referring Provider (PT): Georgina Snell ? ? ?Encounter Date: 07/10/2021 ? ? PT End of Session - 07/10/21 1438   ? ? Visit Number 3   ? Date for PT Re-Evaluation 09/11/21   ? Authorization Type Medicare   ? PT Start Time 1400   ? PT Stop Time 1455   ? PT Time Calculation (min) 55 min   ? Activity Tolerance Patient tolerated treatment well   ? Behavior During Therapy [facility] for tasks assessed/performed   ? ?  ?  ? ?  ? ? ?Past Medical History:  ?Diagnosis Date  ? Anxiety   ? Atrophic vaginitis   ? Breast cancer ([place])   ? 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[place]) 05/20/2015  ? Breast cyst   ? x3  ? DDD (degenerative disc disease)   ? Diverticulosis   ? Fasting hyperglycemia   ? GERD (gastroesophageal reflux disease)   ? [name] syndrome   ? Glaucoma   ? History of hiatal hernia   ? HSV infection   ? Hyperlipidemia   ? Radiation   ? left breast 50.4 gray  ? STD (sexually transmitted disease)   ? HSV  ? ? ?Past Surgical History:  ?Procedure Laterality Date  ? APPENDECTOMY  1961  ? BREAST CYST ASPIRATION    ? x2 right ,x1 left  ? COLONOSCOPY  2007  ? neg  ? [place] OF UTERUS  1975  ? FLEXIBLE SIGMOIDOSCOPY    ? x2  ? PELVIC LAPAROSCOPY    ? RADIOACTIVE SEED GUIDED PARTIAL MASTECTOMY WITH AXILLARY SENTINEL LYMPH NODE BIOPSY Left 05/31/2015  ? Procedure: LEFT BREAST RADIOACTIVE SEED GUIDED LUMPECTOMY WITH LEFT AXILLARY SENTINEL LYMPH NODE BIOPSY;  Surgeon: [name], MD;  Location: [facility];  Service: General;  Laterality: Left;  ? RE-EXCISION OF BREAST LUMPECTOMY Left 07/01/2015  ? Procedure: RE-EXCISION OF LEFT BREAST INFERIOR MARGIN;  Surgeon: [name], MD;  Location: [facility];   Service: General;  Laterality: Left;  ? UPPER GASTROINTESTINAL ENDOSCOPY    ? UPPER GI ENDOSCOPY  10/2010  ? Dr [name]  ? [place]  ? ? ?There were no vitals filed for this visit. ? ? Subjective Assessment - 07/10/21 1403   ? ? Subjective Patient reports that she feels like the education on the golfer's lift really helped.  Reports that she has some pain in the back and the lateral thighs   ? Currently in Pain? Yes   ? Pain Score 4    ? Pain Location Back   ? Pain Orientation Lower   ? Pain Descriptors / Indicators Aching;Sore;Tightness   ? Aggravating Factors  stairs and at night my lateral thighs get really tight and sore   ? ?  ?  ? ?  ? ? ? ? ? ? ? ? ? ? ? ? ? ? ? ? ? ? ? ? [place] Adult PT Treatment/Exercise - 07/10/21 0001   ? ?  ? Ambulation/Gait  ? Gait Comments outside around the back building, trying to keep a good pace   ?  ? Lumbar Exercises: Stretches  ? Passive Hamstring Stretch Right;Left;4 reps;20 seconds   ? ITB Stretch Right;Left;3 reps;20 seconds   ?  Piriformis Stretch Right;Left;4 reps;20 seconds   ?  ? Lumbar Exercises: Aerobic  ? Nustep level 5 x 6 minutes   ?  ? Lumbar Exercises: Machines for Strengthening  ? Other Lumbar Machine Exercise seated row 10#, lats 15# 2x10, 5# straight arm pulls   ?  ? Lumbar Exercises: Supine  ? Bridge with [employer] 10 reps;1 second   ? Bridge with clamshell 20 reps   ? Other Supine Lumbar Exercises feet on ball K2C, trunk rotation, small bridges and isometric abs   ?  ? Moist Heat Therapy  ? Number Minutes Moist Heat 10 Minutes   ? Moist Heat Location Lumbar Spine   ?  ? Electrical Stimulation  ? Electrical Stimulation Location L/S area   ? Electrical Stimulation Action IFC   ? Electrical Stimulation Parameters supine   ? Electrical Stimulation Goals Pain   ? ?  ?  ? ?  ? ? ? ? ? ? ? ? ? ? ? ? PT Short Term Goals - 06/22/21 1658   ? ?  ? PT SHORT TERM GOAL #1  ? Title I with HEP   ? Status Partially Met   ? ?  ?  ? ?  ? ? ? ? PT Long Term  Goals - 07/10/21 1615   ? ?  ? PT LONG TERM GOAL #1  ? Title be able to stand up and walk without stooped posture   ? Status On-going   ?  ? PT LONG TERM GOAL #2  ? Title Patient able to perform ADLS without limitation in ROM   ? Status Partially Met   ?  ? PT LONG TERM GOAL #3  ? Title Patient able to perform ADLs with no pain.   ? Status On-going   ? ?  ?  ? ?  ? ? ? ? ? ? ? ? Plan - 07/10/21 1439   ? ? Clinical Impression Statement Patient reports that she has been doing well, reports that she has had some knee and back pain at times.  She is very tight in the LE mms.  Overall she is feeling better, I added more core stability activities today   ? PT Next Visit Plan progress as tolerated   ? Consulted and Agree with Plan of Care Patient   ? ?  ?  ? ?  ? ? ?Patient will benefit from skilled therapeutic intervention in order to improve the following deficits and impairments:  Abnormal gait, Decreased range of motion, Difficulty walking, Increased muscle spasms, Decreased activity tolerance, Pain, Improper body mechanics, Impaired flexibility, Decreased mobility, Decreased strength ? ?Visit Diagnosis: ?Acute bilateral low back pain without sciatica ? ?Muscle spasm of calf ? ? ? ? ?Problem List ?Patient Active Problem List  ? Diagnosis Date Noted  ? Acute cough 03/29/2021  ? Light headedness 03/29/2021  ? Sore throat 03/29/2021  ? Pounding heartbeat 03/29/2021  ? Dermatitis 12/09/2020  ? Low back pain 10/18/2020  ? COVID-19 08/31/2020  ? Patellofemoral arthritis of right knee 08/25/2020  ? Left wrist pain 04/19/2020  ? Left leg pain 04/18/2020  ? Acute pain of right knee 03/09/2020  ? Sensorineural hearing loss (SNHL) of both ears 03/20/2019  ? Deviated nasal septum 03/02/2019  ? Pulmonary nodule 07/29/2018  ? Dupuytren's contracture of both hands 07/11/2018  ? Ganglion cyst of volar aspect of left wrist 06/04/2018  ? Frozen shoulder 03/18/2018  ? Right rotator cuff tear 10/15/2017  ? Tinnitus of both  ears 01/01/2017   ? Reaction to internal stress 05/29/2016  ? Prediabetes 08/10/2015  ? 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[place]) 05/20/2015  ? Open-angle glaucoma 06/17/2014  ? Vitamin D deficiency 10/18/2011  ? HSV infection   ? Atrophic vaginitis   ? VARICOSE VEINS, LOWER EXTREMITIES 09/28/2009  ? GERD 07/14/2009  ? Hyperlipidemia 12/26/2006  ? GILBERT'S SYNDROME 08/09/2006  ? [place] DISEASE 08/09/2006  ? ? Sumner Boast, PT ?07/10/2021, 4:15 PM ? ?[facility] ?[address] ?[address] ?[place], [place], [address] ?Phone: [phone]   Fax:  [phone] ? ?Name: KAYLA DESHAIES ?MRN: [id number] ?Date of Birth: 09-05-1945 ? ? ? ?

## 2021-07-12 DIAGNOSIS — H401131 Primary open-angle glaucoma, bilateral, mild stage: Secondary | ICD-10-CM | POA: Diagnosis not present

## 2021-07-12 DIAGNOSIS — Z853 Personal history of malignant neoplasm of breast: Secondary | ICD-10-CM | POA: Diagnosis not present

## 2021-07-12 DIAGNOSIS — H15833 Staphyloma posticum, bilateral: Secondary | ICD-10-CM | POA: Diagnosis not present

## 2021-07-12 DIAGNOSIS — H25813 Combined forms of age-related cataract, bilateral: Secondary | ICD-10-CM | POA: Diagnosis not present

## 2021-07-12 DIAGNOSIS — H4423 Degenerative myopia, bilateral: Secondary | ICD-10-CM | POA: Diagnosis not present

## 2021-07-13 ENCOUNTER — Ambulatory Visit: Payer: Medicare Other | Admitting: Physical Therapy

## 2021-07-13 ENCOUNTER — Encounter: Payer: Self-pay | Admitting: Physical Therapy

## 2021-07-13 ENCOUNTER — Other Ambulatory Visit: Payer: Self-pay

## 2021-07-13 DIAGNOSIS — M545 Low back pain, unspecified: Secondary | ICD-10-CM | POA: Diagnosis not present

## 2021-07-13 DIAGNOSIS — M25531 Pain in right wrist: Secondary | ICD-10-CM

## 2021-07-13 DIAGNOSIS — M62831 Muscle spasm of calf: Secondary | ICD-10-CM

## 2021-07-13 NOTE — Therapy (Signed)
[facility] ?[place] ?[address] ?[place], [place], [address] ?Phone: [phone]   Fax:  [phone] ? ?Physical Therapy Treatment ? ?Patient Details  ?Name: Lindsey Barrett ?MRN: [id number] ?Date of Birth: [birth year] ?Referring Provider (PT): Georgina Snell ? ? ?Encounter Date: 07/13/2021 ? ? PT End of Session - 07/13/21 1039   ? ? Visit Number 4   ? Date for PT Re-Evaluation 09/11/21   ? Authorization Type Medicare   ? PT Start Time 1015   ? PT Stop Time 1111   ? PT Time Calculation (min) 56 min   ? Activity Tolerance Patient tolerated treatment well   ? Behavior During Therapy [facility] for tasks assessed/performed   ? ?  ?  ? ?  ? ? ?Past Medical History:  ?Diagnosis Date  ? Anxiety   ? Atrophic vaginitis   ? Breast cancer ([place])   ? 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[place]) 05/20/2015  ? Breast cyst   ? x3  ? DDD (degenerative disc disease)   ? Diverticulosis   ? Fasting hyperglycemia   ? GERD (gastroesophageal reflux disease)   ? [name] syndrome   ? Glaucoma   ? History of hiatal hernia   ? HSV infection   ? Hyperlipidemia   ? Radiation   ? left breast 50.4 gray  ? STD (sexually transmitted disease)   ? HSV  ? ? ?Past Surgical History:  ?Procedure Laterality Date  ? APPENDECTOMY  1961  ? BREAST CYST ASPIRATION    ? x2 right ,x1 left  ? COLONOSCOPY  2007  ? neg  ? [place] OF UTERUS  1975  ? FLEXIBLE SIGMOIDOSCOPY    ? x2  ? PELVIC LAPAROSCOPY    ? RADIOACTIVE SEED GUIDED PARTIAL MASTECTOMY WITH AXILLARY SENTINEL LYMPH NODE BIOPSY Left 05/31/2015  ? Procedure: LEFT BREAST RADIOACTIVE SEED GUIDED LUMPECTOMY WITH LEFT AXILLARY SENTINEL LYMPH NODE BIOPSY;  Surgeon: [name], MD;  Location: [facility];  Service: General;  Laterality: Left;  ? RE-EXCISION OF BREAST LUMPECTOMY Left 07/01/2015  ? Procedure: RE-EXCISION OF LEFT BREAST INFERIOR MARGIN;  Surgeon: [name], MD;  Location: [facility];   Service: General;  Laterality: Left;  ? UPPER GASTROINTESTINAL ENDOSCOPY    ? UPPER GI ENDOSCOPY  10/2010  ? Dr [name]  ? [place]  ? ? ?There were no vitals filed for this visit. ? ? Subjective Assessment - 07/13/21 1020   ? ? Subjective I really think this is helping, I am sore and tight, but overall feeling better   ? Currently in Pain? Yes   ? Pain Score 3    ? Pain Location Back   ? Pain Orientation Lower   ? Pain Descriptors / Indicators Sore;Tightness   ? Pain Relieving Factors the treatment really helps   ? ?  ?  ? ?  ? ? ? ? ? ? ? ? ? ? ? ? ? ? ? ? ? ? ? ? [facility] Adult PT Treatment/Exercise - 07/13/21 0001   ? ?  ? Lumbar Exercises: Stretches  ? Passive Hamstring Stretch Right;Left;4 reps;20 seconds   ? ITB Stretch Right;Left;3 reps;20 seconds   ? Piriformis Stretch Right;Left;4 reps;20 seconds   ?  ? Lumbar Exercises: Aerobic  ? Nustep level 5 x 6 minutes   ?  ? Lumbar Exercises: Machines for Strengthening  ? Cybex Knee Extension 5# 2x10   ? Cybex  Knee Flexion 20# 2x10   ? Leg Press 20# 2x10   ? Other Lumbar Machine Exercise seated row 10#, lats 15# 2x10, 5# straight arm pulls   ?  ? Lumbar Exercises: Supine  ? Bridge with clamshell 20 reps   ? Other Supine Lumbar Exercises feet on ball K2C, trunk rotation, small bridges and isometric abs   ?  ? Moist Heat Therapy  ? Number Minutes Moist Heat 10 Minutes   ? Moist Heat Location Lumbar Spine   ?  ? Electrical Stimulation  ? Electrical Stimulation Location L/S area   ? Electrical Stimulation Action IFC   ? Electrical Stimulation Parameters supine   ? Electrical Stimulation Goals Pain   ? ?  ?  ? ?  ? ? ? ? ? ? ? ? ? ? ? ? PT Short Term Goals - 06/22/21 1658   ? ?  ? PT SHORT TERM GOAL #1  ? Title I with HEP   ? Status Partially Met   ? ?  ?  ? ?  ? ? ? ? PT Long Term Goals - 07/13/21 1055   ? ?  ? PT LONG TERM GOAL #1  ? Title be able to stand up and walk without stooped posture   ? Status Partially Met   ?  ? PT LONG TERM GOAL #2  ? Title  Patient able to perform ADLS without limitation in ROM   ? Status Partially Met   ?  ? PT LONG TERM GOAL #3  ? Title Patient able to perform ADLs with no pain.   ? Status On-going   ?  ? PT LONG TERM GOAL #4  ? Title increase lumbar ROM to WFL's   ? Status Partially Met   ?  ? PT LONG TERM GOAL #5  ? Title independent with advanced gym program   ? Status On-going   ? ?  ?  ? ?  ? ? ? ? ? ? ? ? Plan - 07/13/21 1039   ? ? Clinical Impression Statement Patient overall reports feeling stronger, less tight but still with soreness and tightness.  She likes the stretching and the body mechanics ideas the we have talked about, the ITB and HS are tight   ? PT Next Visit Plan continue to add core and flexibility   ? Consulted and Agree with Plan of Care Patient   ? ?  ?  ? ?  ? ? ?Patient will benefit from skilled therapeutic intervention in order to improve the following deficits and impairments:  Abnormal gait, Decreased range of motion, Difficulty walking, Increased muscle spasms, Decreased activity tolerance, Pain, Improper body mechanics, Impaired flexibility, Decreased mobility, Decreased strength ? ?Visit Diagnosis: ?Acute bilateral low back pain without sciatica ? ?Muscle spasm of calf ? ?Pain in right wrist ? ? ? ? ?Problem List ?Patient Active Problem List  ? Diagnosis Date Noted  ? Acute cough 03/29/2021  ? Light headedness 03/29/2021  ? Sore throat 03/29/2021  ? Pounding heartbeat 03/29/2021  ? Dermatitis 12/09/2020  ? Low back pain 10/18/2020  ? COVID-19 08/31/2020  ? Patellofemoral arthritis of right knee 08/25/2020  ? Left wrist pain 04/19/2020  ? Left leg pain 04/18/2020  ? Acute pain of right knee 03/09/2020  ? Sensorineural hearing loss (SNHL) of both ears 03/20/2019  ? Deviated nasal septum 03/02/2019  ? Pulmonary nodule 07/29/2018  ? Dupuytren's contracture of both hands 07/11/2018  ? Ganglion cyst of volar aspect of left  wrist 06/04/2018  ? Frozen shoulder 03/18/2018  ? Right rotator cuff tear 10/15/2017   ? Tinnitus of both ears 01/01/2017  ? Reaction to internal stress 05/29/2016  ? Prediabetes 08/10/2015  ? 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[place]) 05/20/2015  ? Open-angle glaucoma 06/17/2014  ? Vitamin D deficiency 10/18/2011  ? HSV infection   ? Atrophic vaginitis   ? VARICOSE VEINS, LOWER EXTREMITIES 09/28/2009  ? GERD 07/14/2009  ? Hyperlipidemia 12/26/2006  ? GILBERT'S SYNDROME 08/09/2006  ? [place] DISEASE 08/09/2006  ? ? Sumner Boast, PT ?07/13/2021, 11:40 AM ? ?[facility] ?[place] ?[address] ?[place], [place], [address] ?Phone: [phone]   Fax:  [phone] ? ?Name: NAVAH GRONDIN ?MRN: [id number] ?Date of Birth: August 07, 1945 ? ? ? ?

## 2021-07-17 ENCOUNTER — Other Ambulatory Visit: Payer: Self-pay

## 2021-07-17 ENCOUNTER — Ambulatory Visit: Payer: Medicare Other | Admitting: Physical Therapy

## 2021-07-17 ENCOUNTER — Ambulatory Visit: Payer: Medicare Other | Admitting: Family Medicine

## 2021-07-17 ENCOUNTER — Encounter: Payer: Self-pay | Admitting: Physical Therapy

## 2021-07-17 DIAGNOSIS — M62831 Muscle spasm of calf: Secondary | ICD-10-CM

## 2021-07-17 DIAGNOSIS — M25531 Pain in right wrist: Secondary | ICD-10-CM | POA: Diagnosis not present

## 2021-07-17 DIAGNOSIS — M545 Low back pain, unspecified: Secondary | ICD-10-CM | POA: Diagnosis not present

## 2021-07-17 NOTE — Therapy (Signed)
[facility] ?[place] ?[address] ?[place], [place], [address] ?Phone: [phone]   Fax:  [phone] ? ?Physical Therapy Treatment ? ?Patient Details  ?Name: Lindsey Barrett ?MRN: [id number] ?Date of Birth: 08-17-1945 ?Referring Provider (PT): Georgina Snell ? ? ?Encounter Date: 07/17/2021 ? ? PT End of Session - 07/17/21 1615   ? ? Visit Number 5   ? Date for PT Re-Evaluation 09/11/21   ? Authorization Type Medicare   ? PT Start Time 1530   ? PT Stop Time 1620   ? PT Time Calculation (min) 50 min   ? Activity Tolerance Patient tolerated treatment well   ? Behavior During Therapy [facility] for tasks assessed/performed   ? ?  ?  ? ?  ? ? ?Past Medical History:  ?Diagnosis Date  ? Anxiety   ? Atrophic vaginitis   ? Breast cancer ([place])   ? 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[place]) 05/20/2015  ? Breast cyst   ? x3  ? DDD (degenerative disc disease)   ? Diverticulosis   ? Fasting hyperglycemia   ? GERD (gastroesophageal reflux disease)   ? [name] syndrome   ? Glaucoma   ? History of hiatal hernia   ? HSV infection   ? Hyperlipidemia   ? Radiation   ? left breast 50.4 gray  ? STD (sexually transmitted disease)   ? HSV  ? ? ?Past Surgical History:  ?Procedure Laterality Date  ? APPENDECTOMY  1961  ? BREAST CYST ASPIRATION    ? x2 right ,x1 left  ? COLONOSCOPY  2007  ? neg  ? [facility] OF UTERUS  1975  ? FLEXIBLE SIGMOIDOSCOPY    ? x2  ? PELVIC LAPAROSCOPY    ? RADIOACTIVE SEED GUIDED PARTIAL MASTECTOMY WITH AXILLARY SENTINEL LYMPH NODE BIOPSY Left 05/31/2015  ? Procedure: LEFT BREAST RADIOACTIVE SEED GUIDED LUMPECTOMY WITH LEFT AXILLARY SENTINEL LYMPH NODE BIOPSY;  Surgeon: [name], MD;  Location: [facility];  Service: General;  Laterality: Left;  ? RE-EXCISION OF BREAST LUMPECTOMY Left 07/01/2015  ? Procedure: RE-EXCISION OF LEFT BREAST INFERIOR MARGIN;  Surgeon: [name], MD;  Location: [facility];   Service: General;  Laterality: Left;  ? UPPER GASTROINTESTINAL ENDOSCOPY    ? UPPER GI ENDOSCOPY  10/2010  ? Dr [name]  ? [place]  ? ? ?There were no vitals filed for this visit. ? ? Subjective Assessment - 07/17/21 1534   ? ? Subjective I am feeling pretty good.  Less overall issues.   ? Currently in Pain? Yes   ? Pain Score 3    ? Pain Location Back   ? Pain Orientation Lower   ? Pain Descriptors / Indicators Sore;Tightness   ? Aggravating Factors  standing   ? ?  ?  ? ?  ? ? ? ? ? ? ? ? ? ? ? ? ? ? ? ? ? ? ? ? [facility] Adult PT Treatment/Exercise - 07/17/21 0001   ? ?  ? Ambulation/Gait  ? Gait Comments outside around the back building, trying to keep a good pace   ?  ? Lumbar Exercises: Stretches  ? Passive Hamstring Stretch Right;Left;4 reps;20 seconds   ? Piriformis Stretch Right;Left;4 reps;20 seconds   ?  ? Lumbar Exercises: Aerobic  ? Nustep level 5 x 6 minutes   ?  ? Lumbar Exercises: Machines for Strengthening  ? Cybex Knee Extension 5# 2x10   ?  Cybex Knee Flexion 20# 2x10   ? Leg Press 20# 2x10   ? Other Lumbar Machine Exercise seated row 10#, lats 15# 2x10, 5# straight arm pulls   ?  ? Lumbar Exercises: Supine  ? Other Supine Lumbar Exercises feet on ball K2C, trunk rotation, small bridges and isometric abs   ?  ? Manual Therapy  ? Manual Therapy Soft tissue mobilization   ? Soft tissue mobilization left lumbar area in right sidelying   ? ?  ?  ? ?  ? ? ? ? ? ? ? ? ? ? ? ? PT Short Term Goals - 06/22/21 1658   ? ?  ? PT SHORT TERM GOAL #1  ? Title I with HEP   ? Status Partially Met   ? ?  ?  ? ?  ? ? ? ? PT Long Term Goals - 07/17/21 1616   ? ?  ? PT LONG TERM GOAL #1  ? Title be able to stand up and walk without stooped posture   ? Status Partially Met   ?  ? PT LONG TERM GOAL #2  ? Title Patient able to perform ADLS without limitation in ROM   ? Status Partially Met   ?  ? PT LONG TERM GOAL #3  ? Title Patient able to perform ADLs with no pain.   ? Status Partially Met   ? ?  ?  ? ?   ? ? ? ? ? ? ? ? Plan - 07/17/21 1615   ? ? Clinical Impression Statement Patient doing well, reports feeling stronger, she does report a time just standing last night she had some tightness and spasms in the left lumbar area.  She reports that she just gets tight with standing.   ? PT Next Visit Plan continue to add core and flexibility   ? Consulted and Agree with Plan of Care Patient   ? ?  ?  ? ?  ? ? ?Patient will benefit from skilled therapeutic intervention in order to improve the following deficits and impairments:  Abnormal gait, Decreased range of motion, Difficulty walking, Increased muscle spasms, Decreased activity tolerance, Pain, Improper body mechanics, Impaired flexibility, Decreased mobility, Decreased strength ? ?Visit Diagnosis: ?Acute bilateral low back pain without sciatica ? ?Muscle spasm of calf ? ? ? ? ?Problem List ?Patient Active Problem List  ? Diagnosis Date Noted  ? Acute cough 03/29/2021  ? Light headedness 03/29/2021  ? Sore throat 03/29/2021  ? Pounding heartbeat 03/29/2021  ? Dermatitis 12/09/2020  ? Low back pain 10/18/2020  ? COVID-19 08/31/2020  ? Patellofemoral arthritis of right knee 08/25/2020  ? Left wrist pain 04/19/2020  ? Left leg pain 04/18/2020  ? Acute pain of right knee 03/09/2020  ? Sensorineural hearing loss (SNHL) of both ears 03/20/2019  ? Deviated nasal septum 03/02/2019  ? Pulmonary nodule 07/29/2018  ? Dupuytren's contracture of both hands 07/11/2018  ? Ganglion cyst of volar aspect of left wrist 06/04/2018  ? Frozen shoulder 03/18/2018  ? Right rotator cuff tear 10/15/2017  ? Tinnitus of both ears 01/01/2017  ? Reaction to internal stress 05/29/2016  ? Prediabetes 08/10/2015  ? 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[place]) 05/20/2015  ? Open-angle glaucoma 06/17/2014  ? Vitamin D deficiency 10/18/2011  ? HSV infection   ? Atrophic vaginitis   ? VARICOSE VEINS, LOWER EXTREMITIES 09/28/2009  ? GERD 07/14/2009  ? Hyperlipidemia 12/26/2006  ?  GILBERT'S SYNDROME 08/09/2006  ?  [place] DISEASE 08/09/2006  ? ? Sumner Boast, PT ?07/17/2021, 4:17 PM ? ?[facility] ?[place] ?[address] ?[place], [place], [address] ?Phone: [phone]   Fax:  [phone] ? ?Name: Lindsey Barrett ?MRN: [id number] ?Date of Birth: 19-Oct-1945 ? ? ? ?

## 2021-07-18 NOTE — Progress Notes (Signed)
?Charlann Boxer D.O. ?[facility] ?[place] ?Phone: [phone] ?Subjective:   ?I, Peterson Lombard, PhD, LAT, ATC acting as a scribe for [employer], DO. ? ?I'm seeing this patient by the request  of:  [name], MD ? ?CC: Back and R knee pain ? ?HEN:[id number]  ?Lindsey Barrett is a 76 y.o. female coming in with complaint of back and R knee pain. Last seen in December 2022 for shoulder and R knee pain. Patient has seen Dr. Georgina Snell for LBP in February and has been doing PT since then, completing 5 visits. Today, pt reports her R knee has good days and bad. Within the last week, when standing in the kitchen, w/ rotation she will get a sharp snapping pain along the anterior-medial aspect. This was a one-time occurrence. Pt will have varying back pain and pain into the buttocks, depending.  ?Patient states continues to make improvement. ? ? ?  ? ?Past Medical History:  ?Diagnosis Date  ? Anxiety   ? Atrophic vaginitis   ? Breast cancer ([place])   ? 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[place]) 05/20/2015  ? Breast cyst   ? x3  ? DDD (degenerative disc disease)   ? Diverticulosis   ? Fasting hyperglycemia   ? GERD (gastroesophageal reflux disease)   ? [name] syndrome   ? Glaucoma   ? History of hiatal hernia   ? HSV infection   ? Hyperlipidemia   ? Radiation   ? left breast 50.4 gray  ? STD (sexually transmitted disease)   ? HSV  ? ?Past Surgical History:  ?Procedure Laterality Date  ? APPENDECTOMY  1961  ? BREAST CYST ASPIRATION    ? x2 right ,x1 left  ? COLONOSCOPY  2007  ? neg  ? [place] OF UTERUS  1975  ? FLEXIBLE SIGMOIDOSCOPY    ? x2  ? PELVIC LAPAROSCOPY    ? RADIOACTIVE SEED GUIDED PARTIAL MASTECTOMY WITH AXILLARY SENTINEL LYMPH NODE BIOPSY Left 05/31/2015  ? Procedure: LEFT BREAST RADIOACTIVE SEED GUIDED LUMPECTOMY WITH LEFT AXILLARY SENTINEL LYMPH NODE BIOPSY;  Surgeon: [name], MD;  Location: [facility];   Service: General;  Laterality: Left;  ? RE-EXCISION OF BREAST LUMPECTOMY Left 07/01/2015  ? Procedure: RE-EXCISION OF LEFT BREAST INFERIOR MARGIN;  Surgeon: [name], MD;  Location: [facility];  Service: General;  Laterality: Left;  ? UPPER GASTROINTESTINAL ENDOSCOPY    ? UPPER GI ENDOSCOPY  10/2010  ? Dr [name]  ? [place] EXTRACTION  1978  ? ?Social History  ? ?Socioeconomic History  ? Marital status: Divorced  ?  Spouse name: Not on file  ? Number of children: 0  ? Years of education: Not on file  ? Highest education level: Not on file  ?Occupational History  ? 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 Smoking status: Former  ?  Types: Cigarettes  ?  Quit date: 03/28/1999  ?  Years since quitting: 22.3  ? Smokeless tobacco: Never  ? Tobacco comments:  ?  smoked 1967-2000, up to 3/4 ppd  ?Vaping Use  ? Vaping Use: Never used  ?Substance and Sexual Activity  ? Alcohol use: Yes  ?  Alcohol/week: 10.0 standard drinks  ?  Types: 10 Cans of beer per week  ? Drug use: No  ? Sexual activity: Not Currently  ?  Birth control/protection: Post-menopausal  ?  Comment: 1st  intercourse 76 yo-More than 5 partners  ?Other Topics Concern  ? Not on file  ?Social History Narrative  ? Occupation:  Environmental health practitioner for [employer]  ?   ? Lives alone.  ?   ? Regular exercise-no  ? ?Social Determinants of Health  ? ?Financial Resource Strain: Low Risk   ? Difficulty of Paying Living Expenses: Not hard at all  ?Food Insecurity: No Food Insecurity  ? Worried About Charity fundraiser in the Last Year: Never true  ? Ran Out of Food in the Last Year: Never true  ?Transportation Needs: No Transportation Needs  ? Lack of Transportation (Medical): No  ? Lack of Transportation (Non-Medical): No  ?Physical Activity: Insufficiently Active  ? Days of Exercise per Week: 3 days  ? Minutes of Exercise per Session: 20 min  ?Stress: No Stress Concern Present  ? Feeling of Stress : Not at all  ?Social  Connections: Moderately Integrated  ? Frequency of Communication with Friends and Family: Twice a week  ? Frequency of Social Gatherings with Friends and Family: Twice a week  ? Attends Religious Services: More than 4 times per year  ? Active Member of Clubs or Organizations: Yes  ? Attends Archivist Meetings: More than 4 times per year  ? Marital Status: Divorced  ? ?Allergies  ?Allergen Reactions  ? Arimidex [Anastrozole] Other (See Comments)  ?  Unable to function, loopy, dizzy, and mentally fogginess  ? Latex Rash and Other (See Comments)  ?  Gloves, Band-Aids   ? Septra Ds [Sulfamethoxazole-Trimethoprim] Hives  ? Sulfa Antibiotics Hives  ? Adhesive [Tape] Rash  ? Augmentin [Amoxicillin-Pot Clavulanate] Diarrhea  ? ?Family History  ?Problem Relation Age of Onset  ? Lung cancer Mother   ?     smoker  ? Endometriosis Mother   ? Cancer Mother   ?     Bladder cancer  ? Stomach cancer Maternal Grandmother   ? Coronary artery disease Father   ? Cancer Father 58  ?     intestinal carcinoid tumors of the ileum  ? Sudden death Sister   ?     21 week old, congenital deformity  ? Liver cancer Maternal Uncle   ? Stroke Paternal Grandfather 1  ? Leukemia Maternal Uncle   ? Breast cancer Paternal Aunt   ?     great paternal aunt-Age 49's  ? Colon cancer Paternal Aunt   ?     lived to 42; pat great aunt  ? Vascular Disease Paternal Grandmother   ?     thoracic aneurysm  ? Esophageal cancer Neg Hx   ? Rectal cancer Neg Hx   ? Pancreatic cancer Neg Hx   ? ? ? ?Current Outpatient Medications (Cardiovascular):  ?  ezetimibe (ZETIA) 10 MG tablet, Take 1 tablet by mouth once daily ?  rosuvastatin (CRESTOR) 40 MG tablet, Take 1 tablet (40 mg total) by mouth daily. ? ? ?Current Outpatient Medications (Analgesics):  ?  aspirin EC 81 MG tablet, Take 1 tablet (81 mg total) by mouth daily. ?  ibuprofen (ADVIL) 200 MG tablet, Take 200 mg by mouth every 6 (six) hours as needed. (Patient not taking: Reported on  06/12/2021) ? ? ?Current Outpatient Medications (Other):  ?  bimatoprost (LUMIGAN) 0.01 % SOLN, Place 1 drop into both eyes at bedtime. ?  brinzolamide (AZOPT) 1 % ophthalmic suspension, 1 drop 2 (two) times daily. ?  calcium carbonate (TUMS - DOSED IN MG ELEMENTAL CALCIUM) 500  MG chewable tablet, Chew 1 tablet by mouth as needed for indigestion or heartburn (1000 mg).  ?  Carboxymethylcellulose Sodium (REFRESH LIQUIGEL OP), Apply 1 drop to eye 4 (four) times daily as needed. ?  Cholecalciferol (VITAMIN D3 PO), Take 5,000 Int'l Units by mouth daily.  ?  famotidine (PEPCID) 10 MG tablet, Take 1 tablet (10 mg total) by mouth daily. (Patient taking differently: Take 10 mg by mouth. Daily (may take twice daily if needed)) ?  letrozole (FEMARA) 2.5 MG tablet, Take 1 tablet (2.5 mg total) by mouth daily. ?  sodium chloride (MURO 128) 5 % ophthalmic solution, Place 1 drop into both eyes 2 (two) times a day. Before Azopt ?  Vaginal Lubricant (REPLENS) GEL, Place 1 application vaginally daily. ? ? ?Reviewed prior external information including notes and imaging from  ?primary care provider ?As well as notes that were available from care everywhere and other healthcare systems.  This includes multiple different treatments during the rehabilitation.  Total time reviewing all her notes and imaging previously as well as visit with Dr. Bertram Millard in February greater than 45 minutes ? ?Past medical history, social, surgical and family history all reviewed in electronic medical record.  No pertanent information unless stated regarding to the chief complaint.  ? ?Review of Systems: ? No headache, visual changes, nausea, vomiting, diarrhea, constipation, dizziness, abdominal pain, skin rash, fevers, chills, night sweats, weight loss, swollen lymph nodes, body aches, joint swelling, chest pain, shortness of breath, mood changes. POSITIVE muscle aches ? ?Objective  ?Blood pressure 128/76, pulse 71, height 5' 4.5" (1.638 m), weight 168 lb  12.8 oz (76.6 kg), SpO2 98 %. ?  ?General: No apparent distress alert and oriented x3 mood and affect normal, dressed appropriately.  ?HEENT: Pupils equal, extraocular movements intact  ?Respiratory: Patient's speak in full sentences

## 2021-07-19 ENCOUNTER — Other Ambulatory Visit: Payer: Self-pay

## 2021-07-19 ENCOUNTER — Ambulatory Visit (INDEPENDENT_AMBULATORY_CARE_PROVIDER_SITE_OTHER): Payer: Medicare Other | Admitting: Family Medicine

## 2021-07-19 DIAGNOSIS — M545 Low back pain, unspecified: Secondary | ICD-10-CM

## 2021-07-19 DIAGNOSIS — M1711 Unilateral primary osteoarthritis, right knee: Secondary | ICD-10-CM | POA: Diagnosis not present

## 2021-07-19 DIAGNOSIS — G8929 Other chronic pain: Secondary | ICD-10-CM

## 2021-07-19 NOTE — Patient Instructions (Addendum)
Good to see you ? ?Keep doing what you're doing. ? ?I'm very happy with how you are doing. Just keep working on the stretching and exercises. ? ?See me again in 3 months ?  ?

## 2021-07-19 NOTE — Assessment & Plan Note (Signed)
Known arthritic changes but seems to be doing relatively well.  Worsening pain will consider the possibility of injection. ?

## 2021-07-19 NOTE — Assessment & Plan Note (Signed)
Discussed with patient at great length.  Patient has been making some progress overall.  Discussed icing regimen and home exercise, patient has been doing very well with the formal physical therapist.  Encouraged her to continue to work with her.  Patient does have a trip planned for October when she is likely going to be doing a lot of increasing in walking.  Hopefully this will make improvement so she can enjoy.  Follow-up with me again in 3 months ?

## 2021-07-28 ENCOUNTER — Ambulatory Visit: Payer: Medicare Other | Admitting: Family Medicine

## 2021-07-31 ENCOUNTER — Other Ambulatory Visit: Payer: Self-pay

## 2021-07-31 ENCOUNTER — Ambulatory Visit: Payer: Medicare Other | Admitting: Physical Therapy

## 2021-07-31 ENCOUNTER — Encounter: Payer: Self-pay | Admitting: Physical Therapy

## 2021-07-31 DIAGNOSIS — M25531 Pain in right wrist: Secondary | ICD-10-CM | POA: Diagnosis not present

## 2021-07-31 DIAGNOSIS — M545 Low back pain, unspecified: Secondary | ICD-10-CM | POA: Diagnosis not present

## 2021-07-31 DIAGNOSIS — M62831 Muscle spasm of calf: Secondary | ICD-10-CM | POA: Diagnosis not present

## 2021-07-31 NOTE — Therapy (Signed)
[facility] ?[facility] ?[address] ?[place], [place], [address] ?Phone: [phone]   Fax:  [phone] ? ?Physical Therapy Treatment ? ?Patient Details  ?Name: Lindsey Barrett ?MRN: [id number] ?Date of Birth: [birth year] ?Referring Provider (PT): Georgina Snell ? ? ?Encounter Date: 07/31/2021 ? ? PT End of Session - 07/31/21 1521   ? ? Visit Number 6   ? Date for PT Re-Evaluation 09/11/21   ? Authorization Type Medicare   ? PT Start Time 1443   ? PT Stop Time 1530   ? PT Time Calculation (min) 47 min   ? Activity Tolerance Patient tolerated treatment well   ? Behavior During Therapy [facility] for tasks assessed/performed   ? ?  ?  ? ?  ? ? ?Past Medical History:  ?Diagnosis Date  ? Anxiety   ? Atrophic vaginitis   ? Breast cancer ([place])   ? 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[place]) 05/20/2015  ? Breast cyst   ? x3  ? DDD (degenerative disc disease)   ? Diverticulosis   ? Fasting hyperglycemia   ? GERD (gastroesophageal reflux disease)   ? [name] syndrome   ? Glaucoma   ? History of hiatal hernia   ? HSV infection   ? Hyperlipidemia   ? Radiation   ? left breast 50.4 gray  ? STD (sexually transmitted disease)   ? HSV  ? ? ?Past Surgical History:  ?Procedure Laterality Date  ? APPENDECTOMY  1961  ? BREAST CYST ASPIRATION    ? x2 right ,x1 left  ? COLONOSCOPY  2007  ? neg  ? [place] OF UTERUS  1975  ? FLEXIBLE SIGMOIDOSCOPY    ? x2  ? PELVIC LAPAROSCOPY    ? RADIOACTIVE SEED GUIDED PARTIAL MASTECTOMY WITH AXILLARY SENTINEL LYMPH NODE BIOPSY Left 05/31/2015  ? Procedure: LEFT BREAST RADIOACTIVE SEED GUIDED LUMPECTOMY WITH LEFT AXILLARY SENTINEL LYMPH NODE BIOPSY;  Surgeon: [name], MD;  Location: [facility];  Service: General;  Laterality: Left;  ? RE-EXCISION OF BREAST LUMPECTOMY Left 07/01/2015  ? Procedure: RE-EXCISION OF LEFT BREAST INFERIOR MARGIN;  Surgeon: [name], MD;  Location: [facility];   Service: General;  Laterality: Left;  ? UPPER GASTROINTESTINAL ENDOSCOPY    ? UPPER GI ENDOSCOPY  10/2010  ? Dr [name]  ? [place]  ? ? ?There were no vitals filed for this visit. ? ? Subjective Assessment - 07/31/21 1447   ? ? Subjective I saw MD, very happy with the visit and with my progress.  Still a little stiff and sore   ? Currently in Pain? Yes   ? Pain Score 3    ? Pain Location Back   ? Pain Orientation Lower   ? Pain Descriptors / Indicators Tightness   ? Aggravating Factors  bending, lifting   ? ?  ?  ? ?  ? ? ? ? ? ? ? ? ? ? ? ? ? ? ? ? ? ? ? ? [facility] Adult PT Treatment/Exercise - 07/31/21 0001   ? ?  ? Ambulation/Gait  ? Gait Comments outside around the back building, trying to keep a good pace 2 laps   ?  ? Lumbar Exercises: Aerobic  ? Nustep level 5 x 6 minutes   ?  ? Lumbar Exercises: Machines for Strengthening  ? Cybex Knee Extension 10# 2x10   ? Cybex Knee Flexion 25# 2x10   ?  Leg Press 30# 2x10   ? Other Lumbar Machine Exercise seated row 10#, lats 20# 2x10, 5# straight arm pulls, 25# triceps, 5# biceps   ?  ? Lumbar Exercises: Standing  ? Other Standing Lumbar Exercises 20# farmer carry   ?  ? Lumbar Exercises: Supine  ? Other Supine Lumbar Exercises feet on ball K2C, trunk rotation, small bridges and isometric abs   ? ?  ?  ? ?  ? ? ? ? ? ? ? ? ? ? ? ? PT Short Term Goals - 06/22/21 1658   ? ?  ? PT SHORT TERM GOAL #1  ? Title I with HEP   ? Status Partially Met   ? ?  ?  ? ?  ? ? ? ? PT Long Term Goals - 07/31/21 1523   ? ?  ? PT LONG TERM GOAL #1  ? Title be able to stand up and walk without stooped posture   ? Status Partially Met   ?  ? PT LONG TERM GOAL #2  ? Title Patient able to perform ADLS without limitation in ROM   ? Status Partially Met   ?  ? PT LONG TERM GOAL #3  ? Title Patient able to perform ADLs with no pain.   ? Status Partially Met   ? ?  ?  ? ?  ? ? ? ? ? ? ? ? Plan - 07/31/21 1522   ? ? Clinical Impression Statement Patient feeling better, fatigued with  the walking, cues needed for core activation and posture.  Some knee pain at times but after a few reps no pain   ? PT Next Visit Plan continue to add core and flexibility   ? Consulted and Agree with Plan of Care Patient   ? ?  ?  ? ?  ? ? ?Patient will benefit from skilled therapeutic intervention in order to improve the following deficits and impairments:  Abnormal gait, Decreased range of motion, Difficulty walking, Increased muscle spasms, Decreased activity tolerance, Pain, Improper body mechanics, Impaired flexibility, Decreased mobility, Decreased strength ? ?Visit Diagnosis: ?Acute bilateral low back pain without sciatica ? ?Muscle spasm of calf ? ? ? ? ?Problem List ?Patient Active Problem List  ? Diagnosis Date Noted  ? Acute cough 03/29/2021  ? Light headedness 03/29/2021  ? Sore throat 03/29/2021  ? Pounding heartbeat 03/29/2021  ? Dermatitis 12/09/2020  ? Low back pain 10/18/2020  ? COVID-19 08/31/2020  ? Patellofemoral arthritis of right knee 08/25/2020  ? Left wrist pain 04/19/2020  ? Left leg pain 04/18/2020  ? Acute pain of right knee 03/09/2020  ? Sensorineural hearing loss (SNHL) of both ears 03/20/2019  ? Deviated nasal septum 03/02/2019  ? Pulmonary nodule 07/29/2018  ? Dupuytren's contracture of both hands 07/11/2018  ? Ganglion cyst of volar aspect of left wrist 06/04/2018  ? Frozen shoulder 03/18/2018  ? Right rotator cuff tear 10/15/2017  ? Tinnitus of both ears 01/01/2017  ? Reaction to internal stress 05/29/2016  ? Prediabetes 08/10/2015  ? 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[place]) 05/20/2015  ? Open-angle glaucoma 06/17/2014  ? Vitamin D deficiency 10/18/2011  ? HSV infection   ? Atrophic vaginitis   ? VARICOSE VEINS, LOWER EXTREMITIES 09/28/2009  ? GERD 07/14/2009  ? Hyperlipidemia 12/26/2006  ? GILBERT'S SYNDROME 08/09/2006  ? [place] DISEASE 08/09/2006  ? ? Sumner Boast, PT ?07/31/2021, 3:29 PM ? ?[facility] ?[place] ?[address] ?[place], [place], [address] ?Phone: [phone]   Fax:  [phone] ? ?Name: Lindsey Barrett ?MRN: [id number] ?Date of Birth: 1945/07/18 ? ? ? ?

## 2021-08-01 DIAGNOSIS — H18593 Other hereditary corneal dystrophies, bilateral: Secondary | ICD-10-CM | POA: Diagnosis not present

## 2021-08-01 DIAGNOSIS — H2513 Age-related nuclear cataract, bilateral: Secondary | ICD-10-CM | POA: Diagnosis not present

## 2021-08-01 DIAGNOSIS — H53453 Other localized visual field defect, bilateral: Secondary | ICD-10-CM | POA: Diagnosis not present

## 2021-08-01 DIAGNOSIS — H40053 Ocular hypertension, bilateral: Secondary | ICD-10-CM | POA: Diagnosis not present

## 2021-08-07 ENCOUNTER — Ambulatory Visit: Payer: Medicare Other | Admitting: Physical Therapy

## 2021-08-07 ENCOUNTER — Ambulatory Visit: Payer: Medicare Other | Attending: Family Medicine | Admitting: Physical Therapy

## 2021-08-07 ENCOUNTER — Encounter: Payer: Self-pay | Admitting: Physical Therapy

## 2021-08-07 DIAGNOSIS — M545 Low back pain, unspecified: Secondary | ICD-10-CM | POA: Insufficient documentation

## 2021-08-07 DIAGNOSIS — M62831 Muscle spasm of calf: Secondary | ICD-10-CM | POA: Insufficient documentation

## 2021-08-07 NOTE — Therapy (Signed)
[facility] ?[facility] [address] ?[place], [place], [address] ?Phone: [phone]   Fax:  [phone] ? ?Physical Therapy Treatment ? ?Patient Details  ?Name: Lindsey Barrett ?MRN: [id number] ?Date of Birth: 13-Apr-1946 ?Referring Provider (PT): Georgina Snell ? ? ?Encounter Date: 08/07/2021 ? ? PT End of Session - 08/07/21 1716   ? ? Visit Number 7   ? Date for PT Re-Evaluation 09/11/21   ? Authorization Type Medicare   ? PT Start Time 1615   ? PT Stop Time 1700   ? PT Time Calculation (min) 45 min   ? Activity Tolerance Patient tolerated treatment well   ? Behavior During Therapy [facility] for tasks assessed/performed   ? ?  ?  ? ?  ? ? ?Past Medical History:  ?Diagnosis Date  ? Anxiety   ? Atrophic vaginitis   ? Breast cancer ([place])   ? 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[place]) 05/20/2015  ? Breast cyst   ? x3  ? DDD (degenerative disc disease)   ? Diverticulosis   ? Fasting hyperglycemia   ? GERD (gastroesophageal reflux disease)   ? Rosanna Barrett syndrome   ? Glaucoma   ? History of hiatal hernia   ? HSV infection   ? Hyperlipidemia   ? Radiation   ? left breast 50.4 gray  ? STD (sexually transmitted disease)   ? HSV  ? ? ?Past Surgical History:  ?Procedure Laterality Date  ? APPENDECTOMY  1961  ? BREAST CYST ASPIRATION    ? x2 right ,x1 left  ? COLONOSCOPY  2007  ? neg  ? [place] OF UTERUS  1975  ? FLEXIBLE SIGMOIDOSCOPY    ? x2  ? PELVIC LAPAROSCOPY    ? RADIOACTIVE SEED GUIDED PARTIAL MASTECTOMY WITH AXILLARY SENTINEL LYMPH NODE BIOPSY Left 05/31/2015  ? Procedure: LEFT BREAST RADIOACTIVE SEED GUIDED LUMPECTOMY WITH LEFT AXILLARY SENTINEL LYMPH NODE BIOPSY;  Surgeon: [name], MD;  Location: [facility];  Service: General;  Laterality: Left;  ? RE-EXCISION OF BREAST LUMPECTOMY Left 07/01/2015  ? Procedure: RE-EXCISION OF LEFT BREAST INFERIOR MARGIN;  Surgeon: [name], MD;  Location: [facility];   Service: General;  Laterality: Left;  ? UPPER GASTROINTESTINAL ENDOSCOPY    ? UPPER GI ENDOSCOPY  10/2010  ? Dr [name]  ? [place]  ? ? ?There were no vitals filed for this visit. ? ? Subjective Assessment - 08/07/21 1624   ? ? 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 Currently in Pain? Yes   ? Pain Score 6    ? Pain Location Back   ? Pain Orientation Lower   ? Pain Descriptors / Indicators Tightness;Spasm   ? Aggravating Factors  standing   ? ?  ?  ? ?  ? ? ? ? ? ? ? ? ? ? ? ? ? ? ? ? ? ? ? ? [facility] Adult PT Treatment/Exercise - 08/07/21 0001   ? ?  ? Ambulation/Gait  ? Gait Comments outside around the back building, trying to keep a good pace 2 laps   ?  ? Lumbar Exercises: Stretches  ? Passive Hamstring Stretch Right;Left;4 reps;20 seconds   ? Piriformis Stretch Right;Left;4 reps;20 seconds   ?  ? Lumbar Exercises: Aerobic  ? Nustep level 5 x 6  minutes   ?  ? Lumbar Exercises: Machines for Strengthening  ? Cybex Knee Extension 10# 2x10   ? Cybex Knee Flexion 25# 2x10   ? Leg Press 30# 2x10   ? Other Lumbar Machine Exercise seated row 10#, lats 20# 2x10, 10# straight arm pulls, 25# triceps, 5# biceps   ?  ? Lumbar Exercises: Standing  ? Other Standing Lumbar Exercises 8# sit to stand with shoulder press   ? Other Standing Lumbar Exercises 20# farmer carry   ? ?  ?  ? ?  ? ? ? ? ? ? ? ? ? ? ? ? PT Short Term Goals - 06/22/21 1658   ? ?  ? PT SHORT TERM GOAL #1  ? Title I with HEP   ? Status Partially Met   ? ?  ?  ? ?  ? ? ? ? PT Long Term Goals - 07/31/21 1523   ? ?  ? PT LONG TERM GOAL #1  ? Title be able to stand up and walk without stooped posture   ? Status Partially Met   ?  ? PT LONG TERM GOAL #2  ? Title Patient able to perform ADLS without limitation in ROM   ? Status Partially Met   ?  ? PT LONG TERM GOAL #3  ? Title Patient able to perform ADLs with no pain.   ? Status  Partially Met   ? ?  ?  ? ?  ? ? ? ? ? ? ? ? Plan - 08/07/21 1717   ? ? Clinical Impression Statement Patient able to keep a very good pace with the walk today, she had increaed pain last night with choral practice standing for 1.5 hours holding hymnal.  She reports feeling tight and sore.  She did not have any increase of pain with any of the exercises we did today   ? PT Next Visit Plan continue to add core and flexibility   ? Consulted and Agree with Plan of Care Patient   ? ?  ?  ? ?  ? ? ?Patient will benefit from skilled therapeutic intervention in order to improve the following deficits and impairments:  Abnormal gait, Decreased range of motion, Difficulty walking, Increased muscle spasms, Decreased activity tolerance, Pain, Improper body mechanics, Impaired flexibility, Decreased mobility, Decreased strength ? ?Visit Diagnosis: ?Acute bilateral low back pain without sciatica ? ?Muscle spasm of calf ? ? ? ? ?Problem List ?Patient Active Problem List  ? Diagnosis Date Noted  ? Acute cough 03/29/2021  ? Light headedness 03/29/2021  ? Sore throat 03/29/2021  ? Pounding heartbeat 03/29/2021  ? Dermatitis 12/09/2020  ? Low back pain 10/18/2020  ? COVID-19 08/31/2020  ? Patellofemoral arthritis of right knee 08/25/2020  ? Left wrist pain 04/19/2020  ? Left leg pain 04/18/2020  ? Acute pain of right knee 03/09/2020  ? Sensorineural hearing loss (SNHL) of both ears 03/20/2019  ? Deviated nasal septum 03/02/2019  ? Pulmonary nodule 07/29/2018  ? Dupuytren's contracture of both hands 07/11/2018  ? Ganglion cyst of volar aspect of left wrist 06/04/2018  ? Frozen shoulder 03/18/2018  ? Right rotator cuff tear 10/15/2017  ? Tinnitus of both ears 01/01/2017  ? Reaction to internal stress 05/29/2016  ? Prediabetes 08/10/2015  ? 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[place]) 05/20/2015  ? Open-angle glaucoma 06/17/2014  ? Vitamin D deficiency 10/18/2011  ? HSV infection   ? Atrophic vaginitis   ? VARICOSE  VEINS, LOWER EXTREMITIES 09/28/2009  ? GERD 07/14/2009  ? Hyperlipidemia 12/26/2006  ? GILBERT'S SYNDROME 08/09/2006  ? [place] DISEASE 08/09/2006  ? ? Sumner Boast, PT ?08/07/2021, 5:21 PM ? ?[facility] ?[place] [address] ?[place], [place], [address] ?Phone: [phone]   Fax:  [phone] ? ?Name: YAZLYNN BIRKELAND ?MRN: [id number] ?Date of Birth: 1945/06/14 ? ? ? ?

## 2021-08-09 ENCOUNTER — Encounter: Payer: Self-pay | Admitting: Physical Therapy

## 2021-08-09 ENCOUNTER — Ambulatory Visit: Payer: Medicare Other | Admitting: Physical Therapy

## 2021-08-09 DIAGNOSIS — M62831 Muscle spasm of calf: Secondary | ICD-10-CM | POA: Diagnosis not present

## 2021-08-09 DIAGNOSIS — M545 Low back pain, unspecified: Secondary | ICD-10-CM | POA: Diagnosis not present

## 2021-08-09 NOTE — Therapy (Signed)
[facility] ?[facility] ?[address] ?[place], [place], [address] ?Phone: [phone]   Fax:  [phone] ? ?Physical Therapy Treatment ? ?Patient Details  ?Name: Lindsey Barrett ?MRN: [id number] ?Date of Birth: July 11, 1945 ?Referring Provider (PT): Georgina Snell ? ? ?Encounter Date: 08/09/2021 ? ? PT End of Session - 08/09/21 1440   ? ? Visit Number 8   ? Date for PT Re-Evaluation 09/11/21   ? Authorization Type Medicare   ? PT Start Time 1315   ? PT Stop Time 1410   ? PT Time Calculation (min) 55 min   ? Activity Tolerance Patient tolerated treatment well   ? Behavior During Therapy [facility] for tasks assessed/performed   ? ?  ?  ? ?  ? ? ?Past Medical History:  ?Diagnosis Date  ? Anxiety   ? Atrophic vaginitis   ? Breast cancer ([place])   ? 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[place]) 05/20/2015  ? Breast cyst   ? x3  ? DDD (degenerative disc disease)   ? Diverticulosis   ? Fasting hyperglycemia   ? GERD (gastroesophageal reflux disease)   ? [name] syndrome   ? Glaucoma   ? History of hiatal hernia   ? HSV infection   ? Hyperlipidemia   ? Radiation   ? left breast 50.4 gray  ? STD (sexually transmitted disease)   ? HSV  ? ? ?Past Surgical History:  ?Procedure Laterality Date  ? APPENDECTOMY  1961  ? BREAST CYST ASPIRATION    ? x2 right ,x1 left  ? COLONOSCOPY  2007  ? neg  ? [place] OF UTERUS  1975  ? FLEXIBLE SIGMOIDOSCOPY    ? x2  ? PELVIC LAPAROSCOPY    ? RADIOACTIVE SEED GUIDED PARTIAL MASTECTOMY WITH AXILLARY SENTINEL LYMPH NODE BIOPSY Left 05/31/2015  ? Procedure: LEFT BREAST RADIOACTIVE SEED GUIDED LUMPECTOMY WITH LEFT AXILLARY SENTINEL LYMPH NODE BIOPSY;  Surgeon: [name], MD;  Location: [facility];  Service: General;  Laterality: Left;  ? RE-EXCISION OF BREAST LUMPECTOMY Left 07/01/2015  ? Procedure: RE-EXCISION OF LEFT BREAST INFERIOR MARGIN;  Surgeon: [name], MD;  Location: [facility];   Service: General;  Laterality: Left;  ? UPPER GASTROINTESTINAL ENDOSCOPY    ? UPPER GI ENDOSCOPY  10/2010  ? Dr [name]  ? [place]  ? ? ?There were no vitals filed for this visit. ? ? Subjective Assessment - 08/09/21 1320   ? ? 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 Currently in Pain? Yes   ? Pain Score 3    ? Pain Location Back   ? Pain Orientation Lower   ? Pain Descriptors / Indicators Tightness   ? Aggravating Factors  a lot of activity and standing   ? ?  ?  ? ?  ? ? ? ? ? ? ? ? ? ? ? ? ? ? ? ? ? ? ? ? [facility] Adult PT Treatment/Exercise - 08/09/21 0001   ? ?  ? Ambulation/Gait  ? Gait Comments fast walking around the back building, 2 laps,   ?  ? High Level Balance  ? High Level Balance Comments on airex 6# overhead press   ?  ? Lumbar Exercises: Stretches  ? Passive Hamstring Stretch Right;Left;4 reps;20 seconds   ? Piriformis Stretch Right;Left;4 reps;20 seconds   ?  ?  Lumbar Exercises: Aerobic  ? Recumbent Bike level 3 x 6 minutes   ?  ? Lumbar Exercises: Machines for Strengthening  ? Cybex Knee Extension 10# 2x15   ? Cybex Knee Flexion 25# 2x15   ? Leg Press 40# 2x10   ? Other Lumbar Machine Exercise seated row 15 2x15#, lats 20# 2x15, 10# straight arm pulls, 25# triceps with ropes, 20# biceps 2x10   ?  ? Lumbar Exercises: Supine  ? Other Supine Lumbar Exercises feet on ball K2C, trunk rotation, small bridges and isometric abs   ? ?  ?  ? ?  ? ? ? ? ? ? ? ? ? ? ? ? PT Short Term Goals - 06/22/21 1658   ? ?  ? PT SHORT TERM GOAL #1  ? Title I with HEP   ? Status Partially Met   ? ?  ?  ? ?  ? ? ? ? PT Long Term Goals - 08/09/21 1533   ? ?  ? PT LONG TERM GOAL #1  ? Title be able to stand up and walk without stooped posture   ? Status Partially Met   ?  ? PT LONG TERM GOAL #2  ? Title Patient able to perform ADLS without limitation in ROM   ? Status Partially Met   ? ?  ?  ? ?  ? ? ? ? ? ? ? ? Plan -  08/09/21 1448   ? ? Clinical Impression Statement Patient felt stiffness this morning going down the stairs. She did not have any increase of pain although we increased reps and weight throughout the session. Cues needed for correct posture. She was able to keep a fast pace with the walk today. She stated she felt better after PT session.   ? PT Treatment/Interventions ADLs/Self Care Home Management;Cryotherapy;Electrical Stimulation;Moist Heat;Traction;Functional mobility training;Therapeutic activities;Therapeutic exercise;Balance training;Neuromuscular re-education;Manual techniques;Patient/family education;Dry needling   ? PT Next Visit Plan continue to add core and flexibility   ? Consulted and Agree with Plan of Care Patient   ? ?  ?  ? ?  ? ? ?Patient will benefit from skilled therapeutic intervention in order to improve the following deficits and impairments:  Abnormal gait, Decreased range of motion, Difficulty walking, Increased muscle spasms, Decreased activity tolerance, Pain, Improper body mechanics, Impaired flexibility, Decreased mobility, Decreased strength ? ?Visit Diagnosis: ?Acute bilateral low back pain without sciatica ? ?Muscle spasm of calf ? ? ? ? ?Problem List ?Patient Active Problem List  ? Diagnosis Date Noted  ? Acute cough 03/29/2021  ? Light headedness 03/29/2021  ? Sore throat 03/29/2021  ? Pounding heartbeat 03/29/2021  ? Dermatitis 12/09/2020  ? Low back pain 10/18/2020  ? COVID-19 08/31/2020  ? Patellofemoral arthritis of right knee 08/25/2020  ? Left wrist pain 04/19/2020  ? Left leg pain 04/18/2020  ? Acute pain of right knee 03/09/2020  ? Sensorineural hearing loss (SNHL) of both ears 03/20/2019  ? Deviated nasal septum 03/02/2019  ? Pulmonary nodule 07/29/2018  ? Dupuytren's contracture of both hands 07/11/2018  ? Ganglion cyst of volar aspect of left wrist 06/04/2018  ? Frozen shoulder 03/18/2018  ? Right rotator cuff tear 10/15/2017  ? Tinnitus of both ears 01/01/2017  ?  Reaction to internal stress 05/29/2016  ? Prediabetes 08/10/2015  ? 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[place]) 05/20/2015  ? Open-angle glaucoma 06/17/2014  ? Vitamin D deficiency 10/18/2011  ? HSV infection   ? Atrophic vaginitis   ?  VARICOSE VEINS, LOWER EXTREMITIES 09/28/2009  ? GERD 07/14/2009  ? Hyperlipidemia 12/26/2006  ? GILBERT'S SYNDROME 08/09/2006  ? [place] DISEASE 08/09/2006  ? ? Sumner Boast, PT ?08/09/2021, 3:34 PM ? ?[facility] ?[place] ?[address] ?[place], [place], [address] ?Phone: [phone]   Fax:  [phone] ? ?Name: JELIYAH MIDDLEBROOKS ?MRN: [id number] ?Date of Birth: 03-02-1946 ? ? ? ?

## 2021-08-18 ENCOUNTER — Ambulatory Visit (INDEPENDENT_AMBULATORY_CARE_PROVIDER_SITE_OTHER): Payer: Medicare Other | Admitting: Nurse Practitioner

## 2021-08-18 ENCOUNTER — Encounter: Payer: Self-pay | Admitting: Nurse Practitioner

## 2021-08-18 VITALS — BP 122/68 | HR 77 | Temp 98.2°F | Ht 64.0 in | Wt 170.4 lb

## 2021-08-18 DIAGNOSIS — R21 Rash and other nonspecific skin eruption: Secondary | ICD-10-CM | POA: Diagnosis not present

## 2021-08-18 MED ORDER — CLOTRIMAZOLE-BETAMETHASONE 1-0.05 % EX CREA: 1.0000 "application " | TOPICAL_CREAM | Freq: Every day | CUTANEOUS | 0 refills | Status: DC

## 2021-08-18 NOTE — Patient Instructions (Signed)
It was great to see you! ? ?Start lotrisone cream daily to your neck. Make sure that the skin stays dry when you wash it.  ? ?Let's follow-up if your symptoms don't improve or worsen.  ? ?Take care, ? ?Vance Peper, NP ? ?

## 2021-08-18 NOTE — Assessment & Plan Note (Signed)
Two bumps to anterior and posterior neck, most likely from a bug bite. Rash noted to skin folds in neck, not coming from the bug bite, but underneath it. Most likely intertrigo from moisture of dawn soap or neosporin and then going to bed. Encouraged her to wash the area with soap and water daily and pat dry. Ensure skin stays dry. She can use lotrisone cream daily to affected areas. Follow-up if symptoms worsen or don't improve.  ?

## 2021-08-18 NOTE — Progress Notes (Signed)
? ?Acute Office Visit ? ?Subjective:  ? ? Patient ID: Lindsey Barrett, female    DOB: 1945-11-15, 76 y.o.   MRN: [id number] ? ?Chief Complaint  ?Patient presents with  ? Insect Bite  ?  Possible bug bite or reaction on neck noticed bumps and red streaks on neck symptoms x 2-3 days ago.   ? ? ?HPI ?Patient is in today for bumps and red streaks on her neck for the last 2-3 days. She fell asleep on her cough and woke up with a few bumps/potential bite marks. She describes that it is itching. She tried dawn dish soap on her neck. Then she also put neosporin on it last night. Then this morning, she noticed streaks on her neck. The itching has slowed down. Denies shortness of breath, chest pain, and fevers.  ? ?Past Medical History:  ?Diagnosis Date  ? Anxiety   ? Atrophic vaginitis   ? Breast cancer ([place])   ? 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[place]) 05/20/2015  ? Breast cyst   ? x3  ? DDD (degenerative disc disease)   ? Diverticulosis   ? Fasting hyperglycemia   ? GERD (gastroesophageal reflux disease)   ? [name] syndrome   ? Glaucoma   ? History of hiatal hernia   ? HSV infection   ? Hyperlipidemia   ? Radiation   ? left breast 50.4 gray  ? STD (sexually transmitted disease)   ? HSV  ? ? ?Past Surgical History:  ?Procedure Laterality Date  ? APPENDECTOMY  1961  ? BREAST CYST ASPIRATION    ? x2 right ,x1 left  ? COLONOSCOPY  2007  ? neg  ? [place] OF UTERUS  1975  ? FLEXIBLE SIGMOIDOSCOPY    ? x2  ? PELVIC LAPAROSCOPY    ? RADIOACTIVE SEED GUIDED PARTIAL MASTECTOMY WITH AXILLARY SENTINEL LYMPH NODE BIOPSY Left 05/31/2015  ? Procedure: LEFT BREAST RADIOACTIVE SEED GUIDED LUMPECTOMY WITH LEFT AXILLARY SENTINEL LYMPH NODE BIOPSY;  Surgeon: [name], MD;  Location: [facility];  Service: General;  Laterality: Left;  ? RE-EXCISION OF BREAST LUMPECTOMY Left 07/01/2015  ? Procedure: RE-EXCISION OF LEFT BREAST INFERIOR MARGIN;  Surgeon: [name], MD;   Location: [facility];  Service: General;  Laterality: Left;  ? UPPER GASTROINTESTINAL ENDOSCOPY    ? UPPER GI ENDOSCOPY  10/2010  ? Dr [name]  ? [place]  ? ? ?Family History  ?Problem Relation Age of Onset  ? Lung cancer Mother   ?     smoker  ? Endometriosis Mother   ? Cancer Mother   ?     Bladder cancer  ? Stomach cancer Maternal Grandmother   ? Coronary artery disease Father   ? Cancer Father 78  ?     intestinal carcinoid tumors of the ileum  ? Sudden death Sister   ?     20 week old, congenital deformity  ? Liver cancer Maternal Uncle   ? Stroke Paternal Grandfather 13  ? Leukemia Maternal Uncle   ? Breast cancer Paternal Aunt   ?     great paternal aunt-Age 79's  ? Colon cancer Paternal Aunt   ?     lived to 96; pat great aunt  ? Vascular Disease Paternal Grandmother   ?     thoracic aneurysm  ? Esophageal cancer Neg Hx   ? Rectal cancer Neg Hx   ? Pancreatic cancer Neg  Hx   ? ? ?Social History  ? ?Socioeconomic History  ? Marital status: Divorced  ?  Spouse name: Not on file  ? Number of children: 0  ? Years of education: Not on file  ? Highest education level: Not on file  ?Occupational History  ? 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 Smoking status: Former  ?  Types: Cigarettes  ?  Quit date: 03/28/1999  ?  Years since quitting: 22.4  ? Smokeless tobacco: Never  ? Tobacco comments:  ?  smoked 1967-2000, up to 3/4 ppd  ?Vaping Use  ? Vaping Use: Never used  ?Substance and Sexual Activity  ? Alcohol use: Yes  ?  Alcohol/week: 10.0 standard drinks  ?  Types: 10 Cans of beer per week  ? Drug use: No  ? Sexual activity: Not Currently  ?  Birth control/protection: Post-menopausal  ?  Comment: 1st intercourse 76 yo-More than 5 partners  ?Other Topics Concern  ? Not on file  ?Social History Narrative  ? Occupation:  Environmental health practitioner for [employer]  ?   ? Lives alone.  ?   ? Regular exercise-no  ? ?Social Determinants of Health  ? ?Financial Resource  Strain: Low Risk   ? Difficulty of Paying Living Expenses: Not hard at all  ?Food Insecurity: No Food Insecurity  ? Worried About Charity fundraiser in the Last Year: Never true  ? Ran Out of Food in the Last Year: Never true  ?Transportation Needs: No Transportation Needs  ? Lack of Transportation (Medical): No  ? Lack of Transportation (Non-Medical): No  ?Physical Activity: Insufficiently Active  ? Days of Exercise per Week: 3 days  ? Minutes of Exercise per Session: 20 min  ?Stress: No Stress Concern Present  ? Feeling of Stress : Not at all  ?Social Connections: Moderately Integrated  ? Frequency of Communication with Friends and Family: Twice a week  ? Frequency of Social Gatherings with Friends and Family: Twice a week  ? Attends Religious Services: More than 4 times per year  ? Active Member of Clubs or Organizations: Yes  ? Attends Archivist Meetings: More than 4 times per year  ? Marital Status: Divorced  ?Intimate Partner Violence: Not At Risk  ? Fear of Current or Ex-Partner: No  ? Emotionally Abused: No  ? Physically Abused: No  ? Sexually Abused: No  ? ? ?Outpatient Medications Prior to Visit  ?Medication Sig Dispense Refill  ? aspirin EC 81 MG tablet Take 1 tablet (81 mg total) by mouth daily.    ? bimatoprost (LUMIGAN) 0.01 % SOLN Place 1 drop into both eyes at bedtime.    ? brinzolamide (AZOPT) 1 % ophthalmic suspension 1 drop 2 (two) times daily.    ? calcium carbonate (TUMS - DOSED IN MG ELEMENTAL CALCIUM) 500 MG chewable tablet Chew 1 tablet by mouth as needed for indigestion or heartburn (1000 mg).     ? Carboxymethylcellulose Sodium (REFRESH LIQUIGEL OP) Apply 1 drop to eye 4 (four) times daily as needed.    ? Cholecalciferol (VITAMIN D3 PO) Take 5,000 Int'l Units by mouth daily.     ? ezetimibe (ZETIA) 10 MG tablet Take 1 tablet by mouth once daily 90 tablet 2  ? famotidine (PEPCID) 10 MG tablet Take 1 tablet (10 mg total) by mouth daily. (Patient taking differently: Take 10 mg by  mouth. Daily (may take twice daily if needed)) 90 tablet 3  ? ibuprofen (  ADVIL) 200 MG tablet Take 200 mg by mouth every 6 (six) hours as needed.    ? letrozole (FEMARA) 2.5 MG tablet Take 1 tablet (2.5 mg total) by mouth daily. 90 tablet 3  ? rosuvastatin (CRESTOR) 40 MG tablet Take 1 tablet (40 mg total) by mouth daily. 90 tablet 3  ? sodium chloride (MURO 128) 5 % ophthalmic solution Place 1 drop into both eyes 2 (two) times a day. Before Azopt    ? Vaginal Lubricant (REPLENS) GEL Place 1 application vaginally daily.    ? ?No facility-administered medications prior to visit.  ? ? ?Allergies  ?Allergen Reactions  ? Arimidex [Anastrozole] Other (See Comments)  ?  Unable to function, loopy, dizzy, and mentally fogginess  ? Latex Rash and Other (See Comments)  ?  Gloves, Band-Aids   ? Septra Ds [Sulfamethoxazole-Trimethoprim] Hives  ? Sulfa Antibiotics Hives  ? Adhesive [Tape] Rash  ? Augmentin [Amoxicillin-Pot Clavulanate] Diarrhea  ? ? ?Review of Systems ?See pertinent positives and negatives per HPI. ?   ?Objective:  ?  ?Physical Exam ?Vitals and nursing note reviewed.  ?Constitutional:   ?   General: She is not in acute distress. ?   Appearance: Normal appearance.  ?HENT:  ?   Head: Normocephalic.  ?Eyes:  ?   Conjunctiva/sclera: Conjunctivae normal.  ?Cardiovascular:  ?   Rate and Rhythm: Normal rate.  ?Pulmonary:  ?   Effort: Pulmonary effort is normal.  ?Musculoskeletal:  ?   Cervical back: Normal range of motion.  ?Skin: ?   General: Skin is warm.  ?   Comments: One raised, red bump to posterior neck, one raise bump under anterior neck. Red, flat rash under skin folds in neck  ?Neurological:  ?   General: No focal deficit present.  ?   Mental Status: She is alert and oriented to person, place, and time.  ?Psychiatric:     ?   Mood and Affect: Mood normal.     ?   Behavior: Behavior normal.     ?   Thought Content: Thought content normal.     ?   Judgment: Judgment normal.  ? ? ?BP 122/68 (BP Location: Right  Arm, Patient Position: Sitting, Cuff Size: Normal)   Pulse 77   Temp 98.2 ?F (36.8 ?C) (Temporal)   Ht '5\' 4"'$  (1.626 m)   Wt 170 lb 6.4 oz (77.3 kg)   SpO2 95%   BMI 29.25 kg/m?  ?Wt Readings from Last 3 Encounters

## 2021-08-28 DIAGNOSIS — Z20822 Contact with and (suspected) exposure to covid-19: Secondary | ICD-10-CM | POA: Diagnosis not present

## 2021-08-30 ENCOUNTER — Ambulatory Visit: Payer: Medicare Other | Admitting: Physical Therapy

## 2021-09-06 ENCOUNTER — Ambulatory Visit: Payer: Medicare Other | Attending: Family Medicine | Admitting: Physical Therapy

## 2021-09-06 ENCOUNTER — Encounter: Payer: Self-pay | Admitting: Physical Therapy

## 2021-09-06 DIAGNOSIS — M25561 Pain in right knee: Secondary | ICD-10-CM | POA: Diagnosis not present

## 2021-09-06 DIAGNOSIS — M62831 Muscle spasm of calf: Secondary | ICD-10-CM | POA: Insufficient documentation

## 2021-09-06 DIAGNOSIS — M25531 Pain in right wrist: Secondary | ICD-10-CM | POA: Diagnosis not present

## 2021-09-06 DIAGNOSIS — M545 Low back pain, unspecified: Secondary | ICD-10-CM | POA: Insufficient documentation

## 2021-09-06 DIAGNOSIS — M6281 Muscle weakness (generalized): Secondary | ICD-10-CM | POA: Insufficient documentation

## 2021-09-06 NOTE — Therapy (Signed)
[facility] ?[facility] ?[address] ?[place], [place], [address] ?Phone: [phone]   Fax:  [phone] ? ?Physical Therapy Treatment ? ?Patient Details  ?Name: Lindsey Barrett ?MRN: [id number] ?Date of Birth: 1946-04-14 ?Referring Provider (PT): Georgina Snell ? ? ?Encounter Date: 09/06/2021 ? ? PT End of Session - 09/06/21 1610   ? ? Visit Number 9   ? Date for PT Re-Evaluation 09/11/21   ? Authorization Type Medicare   ? PT Start Time 1525   ? PT Stop Time 1612   ? PT Time Calculation (min) 47 min   ? Activity Tolerance Patient tolerated treatment well   ? Behavior During Therapy [facility] for tasks assessed/performed   ? ?  ?  ? ?  ? ? ?Past Medical History:  ?Diagnosis Date  ? Anxiety   ? Atrophic vaginitis   ? Breast cancer ([place])   ? 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[place]) 05/20/2015  ? Breast cyst   ? x3  ? DDD (degenerative disc disease)   ? Diverticulosis   ? Fasting hyperglycemia   ? GERD (gastroesophageal reflux disease)   ? [name] syndrome   ? Glaucoma   ? History of hiatal hernia   ? HSV infection   ? Hyperlipidemia   ? Radiation   ? left breast 50.4 gray  ? STD (sexually transmitted disease)   ? HSV  ? ? ?Past Surgical History:  ?Procedure Laterality Date  ? APPENDECTOMY  1961  ? BREAST CYST ASPIRATION    ? x2 right ,x1 left  ? COLONOSCOPY  2007  ? neg  ? [place] OF UTERUS  1975  ? FLEXIBLE SIGMOIDOSCOPY    ? x2  ? PELVIC LAPAROSCOPY    ? RADIOACTIVE SEED GUIDED PARTIAL MASTECTOMY WITH AXILLARY SENTINEL LYMPH NODE BIOPSY Left 05/31/2015  ? Procedure: LEFT BREAST RADIOACTIVE SEED GUIDED LUMPECTOMY WITH LEFT AXILLARY SENTINEL LYMPH NODE BIOPSY;  Surgeon: [name], MD;  Location: [facility];  Service: General;  Laterality: Left;  ? RE-EXCISION OF BREAST LUMPECTOMY Left 07/01/2015  ? Procedure: RE-EXCISION OF LEFT BREAST INFERIOR MARGIN;  Surgeon: [name], MD;  Location: [facility];   Service: General;  Laterality: Left;  ? UPPER GASTROINTESTINAL ENDOSCOPY    ? UPPER GI ENDOSCOPY  10/2010  ? Dr [name]  ? [place]  ? ? ?There were no vitals filed for this visit. ? ? Subjective Assessment - 09/06/21 1536   ? ? Subjective Patient reports that she was doing okay until this week, did a lot of work in garage, reports did okay climbing ladders, reports that earlier this week she had the right knee give on her and is feeling stiff   ? Currently in Pain? Yes   ? Pain Score 3    ? Pain Location Knee   ? Pain Orientation Right   ? Pain Descriptors / Indicators Tightness;Sore   ? Aggravating Factors  stairs, activity   ? ?  ?  ? ?  ? ? ? ? ? ? ? ? ? ? ? ? ? ? ? ? ? ? ? ? [facility] Adult PT Treatment/Exercise - 09/06/21 0001   ? ?  ? Lumbar Exercises: Aerobic  ? Recumbent Bike level 3 x 6 minutes   ? Nustep level 5 x 6 minutes   ?  ? Lumbar Exercises: Machines for Strengthening  ? Other Lumbar Machine Exercise seated row 15 2x20#, lats  20# 2x15, 10# straight arm pulls, 25# triceps with ropes, 20# biceps 2x10   ?  ? Lumbar Exercises: Supine  ? Other Supine Lumbar Exercises feet on ball K2C, trunk rotation, small bridges and isometric abs   ?  ? Manual Therapy  ? Soft tissue mobilization right knee, ITB and vastus lateralis   ? ?  ?  ? ?  ? ? ? ? ? ? ? ? ? ? ? ? PT Short Term Goals - 06/22/21 1658   ? ?  ? PT SHORT TERM GOAL #1  ? Title I with HEP   ? Status Partially Met   ? ?  ?  ? ?  ? ? ? ? PT Long Term Goals - 09/06/21 1612   ? ?  ? PT LONG TERM GOAL #1  ? Title be able to stand up and walk without stooped posture   ? Status Partially Met   ?  ? PT LONG TERM GOAL #2  ? Title Patient able to perform ADLS without limitation in ROM   ? Status Partially Met   ?  ? PT LONG TERM GOAL #3  ? Title Patient able to perform ADLs with no pain.   ? Status Partially Met   ?  ? PT LONG TERM GOAL #4  ? Title increase lumbar ROM to WFL's   ? Status Partially Met   ?  ? PT LONG TERM GOAL #5  ? Title  independent with advanced gym program   ? Status Partially Met   ? ?  ?  ? ?  ? ? ? ? ? ? ? ? Plan - 09/06/21 1611   ? ? Clinical Impression Statement REported going down stairs this week really had the right knee give out, reports no fll but knee is feeling tight, no back pain doing well overall,  some difficulty with the full revolution with the bike   ? PT Next Visit Plan continue to add core and flexibility   ? Consulted and Agree with Plan of Care Patient   ? ?  ?  ? ?  ? ? ?Patient will benefit from skilled therapeutic intervention in order to improve the following deficits and impairments:  Abnormal gait, Decreased range of motion, Difficulty walking, Increased muscle spasms, Decreased activity tolerance, Pain, Improper body mechanics, Impaired flexibility, Decreased mobility, Decreased strength ? ?Visit Diagnosis: ?Acute bilateral low back pain without sciatica ? ?Muscle spasm of calf ? ?Acute pain of right knee ? ? ? ? ?Problem List ?Patient Active Problem List  ? Diagnosis Date Noted  ? Rash 08/18/2021  ? Acute cough 03/29/2021  ? Light headedness 03/29/2021  ? Sore throat 03/29/2021  ? Pounding heartbeat 03/29/2021  ? Dermatitis 12/09/2020  ? Low back pain 10/18/2020  ? COVID-19 08/31/2020  ? Patellofemoral arthritis of right knee 08/25/2020  ? Left wrist pain 04/19/2020  ? Left leg pain 04/18/2020  ? Acute pain of right knee 03/09/2020  ? Sensorineural hearing loss (SNHL) of both ears 03/20/2019  ? Deviated nasal septum 03/02/2019  ? Pulmonary nodule 07/29/2018  ? Dupuytren's contracture of both hands 07/11/2018  ? Ganglion cyst of volar aspect of left wrist 06/04/2018  ? Frozen shoulder 03/18/2018  ? Right rotator cuff tear 10/15/2017  ? Tinnitus of both ears 01/01/2017  ? Reaction to internal stress 05/29/2016  ? Prediabetes 08/10/2015  ? 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[place]) 05/20/2015  ? Open-angle glaucoma 06/17/2014  ? Vitamin D  deficiency 10/18/2011  ? HSV infection   ?  Atrophic vaginitis   ? VARICOSE VEINS, LOWER EXTREMITIES 09/28/2009  ? GERD 07/14/2009  ? Hyperlipidemia 12/26/2006  ? GILBERT'S SYNDROME 08/09/2006  ? [place] DISEASE 08/09/2006  ? ? Sumner Boast, PT ?09/06/2021, 4:13 PM ? ?[facility] ?[place] [address] ?[place], [place], [address] ?Phone: [phone]   Fax:  [phone] ? ?Name: Lindsey Barrett ?MRN: [id number] ?Date of Birth: Aug 15, 1945 ? ? ? ?

## 2021-09-07 ENCOUNTER — Telehealth: Payer: Self-pay | Admitting: Hematology and Oncology

## 2021-09-07 NOTE — Telephone Encounter (Signed)
Rescheduled appointment per providers. Patient aware.  ? ?

## 2021-09-08 ENCOUNTER — Telehealth: Payer: Self-pay | Admitting: Internal Medicine

## 2021-09-08 ENCOUNTER — Ambulatory Visit (INDEPENDENT_AMBULATORY_CARE_PROVIDER_SITE_OTHER): Payer: Medicare Other

## 2021-09-08 ENCOUNTER — Ambulatory Visit (INDEPENDENT_AMBULATORY_CARE_PROVIDER_SITE_OTHER): Payer: Medicare Other | Admitting: Sports Medicine

## 2021-09-08 VITALS — BP 120/72 | HR 62 | Ht 64.0 in | Wt 170.0 lb

## 2021-09-08 DIAGNOSIS — Z20822 Contact with and (suspected) exposure to covid-19: Secondary | ICD-10-CM | POA: Diagnosis not present

## 2021-09-08 DIAGNOSIS — M79672 Pain in left foot: Secondary | ICD-10-CM | POA: Diagnosis not present

## 2021-09-08 DIAGNOSIS — H43811 Vitreous degeneration, right eye: Secondary | ICD-10-CM | POA: Diagnosis not present

## 2021-09-08 NOTE — Progress Notes (Signed)
? ? Benito Mccreedy D.Merril Abbe ?[facility] ?[place] ?Phone: [phone] ?  ?Assessment and Plan:   ?  ?1. Left foot pain ?-Acute, uncomplicated, initial sports medicine visit ?- Contusion of left foot after dropping a glass blender from 5 foot 6 inch height onto dorsum of left foot ?- X-ray obtained in clinic.  My interpretation: No acute fracture or dislocation.  Mild enthesophyte change over distal talus.  Moderate changes at first MTP with valgus deformity.  No evidence of fracture or swelling at site of contusion ?- May use Voltaren gel topically or Tylenol as needed for pain relief ?- DG Foot Complete Left; Future  ?  ?Pertinent previous records reviewed include none ?  ?Follow Up: As needed ?  ?Subjective:   ?I, Pincus Badder, am serving as a Education administrator for [employer] ? ?Chief Complaint: left foot pain  ? ?HPI:  ? ?09/08/21 ?Patient is a 76 year old female complaining of left foot pain . Patient states that yesterday blender fell on her foot , has been icing , doesn't hurt except when she goes up on her toes, blender fell on top  of her foot  ? ?Relevant Historical Information: History of breast cancer ? ?Additional pertinent review of systems negative. ? ? ?Current Outpatient Medications:  ?  aspirin EC 81 MG tablet, Take 1 tablet (81 mg total) by mouth daily., Disp:  , Rfl:  ?  bimatoprost (LUMIGAN) 0.01 % SOLN, Place 1 drop into both eyes at bedtime., Disp: , Rfl:  ?  brinzolamide (AZOPT) 1 % ophthalmic suspension, 1 drop 2 (two) times daily., Disp: , Rfl:  ?  calcium carbonate (TUMS - DOSED IN MG ELEMENTAL CALCIUM) 500 MG chewable tablet, Chew 1 tablet by mouth as needed for indigestion or heartburn (1000 mg). , Disp: , Rfl:  ?  Carboxymethylcellulose Sodium (REFRESH LIQUIGEL OP), Apply 1 drop to eye 4 (four) times daily as needed., Disp: , Rfl:  ?  Cholecalciferol (VITAMIN D3 PO), Take 5,000 Int'l Units by mouth daily. , Disp: , Rfl:  ?   clotrimazole-betamethasone (LOTRISONE) cream, Apply 1 application. topically daily., Disp: 30 g, Rfl: 0 ?  ezetimibe (ZETIA) 10 MG tablet, Take 1 tablet by mouth once daily, Disp: 90 tablet, Rfl: 2 ?  famotidine (PEPCID) 10 MG tablet, Take 1 tablet (10 mg total) by mouth daily. (Patient taking differently: Take 10 mg by mouth. Daily (may take twice daily if needed)), Disp: 90 tablet, Rfl: 3 ?  ibuprofen (ADVIL) 200 MG tablet, Take 200 mg by mouth every 6 (six) hours as needed., Disp: , Rfl:  ?  letrozole (FEMARA) 2.5 MG tablet, Take 1 tablet (2.5 mg total) by mouth daily., Disp: 90 tablet, Rfl: 3 ?  rosuvastatin (CRESTOR) 40 MG tablet, Take 1 tablet (40 mg total) by mouth daily., Disp: 90 tablet, Rfl: 3 ?  sodium chloride (MURO 128) 5 % ophthalmic solution, Place 1 drop into both eyes 2 (two) times a day. Before Azopt, Disp: , Rfl:  ?  Vaginal Lubricant (REPLENS) GEL, Place 1 application vaginally daily., Disp: , Rfl:   ? ?Objective:   ?  ?Vitals:  ? 09/08/21 1348  ?BP: 120/72  ?Pulse: 62  ?SpO2: 97%  ?Weight: 170 lb (77.1 kg)  ?Height: '5\' 4"'$  (1.626 m)  ?  ?  ?Body mass index is 29.18 kg/m?.  ?  ?Physical Exam:   ? ?Gen: Appears well, nad, nontoxic and pleasant ?Psych: Alert and oriented, appropriate  mood and affect ?Neuro: sensation intact, strength is 5/5 with df/pf/inv/ev, muscle tone wnl ?Skin: no susupicious lesions or rashes ? ?Left foot/ankle: no deformity, no swelling or effusion ?TTP mildly navicular at area of contusion ?NTTP over fibular head, lat mal, medial mal, achilles ,base of 5th, ATFL, CFL, deltoid, calcaneous or midfoot ?ROM DF 30, PF 45, inv/ev intact ?Negative ant drawer, talar tilt, rotation test, squeeze test. ?Neg thompson ?No pain with resisted inversion or eversion  ? ? ?Electronically signed by:  ?Benito Mccreedy D.Merril Abbe ?[facility] ?2:35 PM 09/08/21 ?

## 2021-09-08 NOTE — Telephone Encounter (Signed)
Spoke with patient today. 

## 2021-09-08 NOTE — Patient Instructions (Signed)
Good to see you   

## 2021-09-08 NOTE — Telephone Encounter (Signed)
Pt went to pharmacy yesterday and the pharmacist told her she is due for 4 shots.Just had physical with PCP- was only told about Shingles injection.  ? ?Requesting a call back about if she needs the other shots or not.  ? ?Please call mobile.  ?

## 2021-09-11 ENCOUNTER — Encounter: Payer: Self-pay | Admitting: Physical Therapy

## 2021-09-11 ENCOUNTER — Ambulatory Visit: Payer: Medicare Other | Admitting: Physical Therapy

## 2021-09-11 DIAGNOSIS — M25561 Pain in right knee: Secondary | ICD-10-CM

## 2021-09-11 DIAGNOSIS — M6281 Muscle weakness (generalized): Secondary | ICD-10-CM | POA: Diagnosis not present

## 2021-09-11 DIAGNOSIS — M545 Low back pain, unspecified: Secondary | ICD-10-CM

## 2021-09-11 DIAGNOSIS — M62831 Muscle spasm of calf: Secondary | ICD-10-CM | POA: Diagnosis not present

## 2021-09-11 DIAGNOSIS — M25531 Pain in right wrist: Secondary | ICD-10-CM | POA: Diagnosis not present

## 2021-09-11 NOTE — Therapy (Signed)
[facility] ?[place] ?[address] ?[place], [place], [address] ?Phone: [phone]   Fax:  [phone] ?Progress Note ?Reporting Period 06/14/21 to 09/11/21 ? ?See note below for Objective Data and Assessment of Progress/Goals.  ? ?  ?Physical Therapy Treatment ? ?Patient Details  ?Name: Lindsey Barrett ?MRN: [id number] ?Date of Birth: December 25, 1945 ?Referring Provider (PT): Georgina Snell ? ? ?Encounter Date: 09/11/2021 ? ? PT End of Session - 09/11/21 1651   ? ? Visit Number 10   ? Date for PT Re-Evaluation 10/13/21   ? Authorization Type Medicare   ? PT Start Time 1610   ? PT Stop Time 1700   ? PT Time Calculation (min) 50 min   ? Activity Tolerance Patient tolerated treatment well   ? Behavior During Therapy [facility] for tasks assessed/performed   ? ?  ?  ? ?  ? ? ?Past Medical History:  ?Diagnosis Date  ? Anxiety   ? Atrophic vaginitis   ? Breast cancer ([place])   ? 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 Breast cyst   ? x3  ? DDD (degenerative disc disease)   ? Diverticulosis   ? Fasting hyperglycemia   ? GERD (gastroesophageal reflux disease)   ? [name] syndrome   ? Glaucoma   ? History of hiatal hernia   ? HSV infection   ? Hyperlipidemia   ? Radiation   ? left breast 50.4 gray  ? STD (sexually transmitted disease)   ? HSV  ? ? ?Past Surgical History:  ?Procedure Laterality Date  ? APPENDECTOMY  1961  ? BREAST CYST ASPIRATION    ? x2 right ,x1 left  ? COLONOSCOPY  2007  ? neg  ? [place] OF UTERUS  1975  ? FLEXIBLE SIGMOIDOSCOPY    ? x2  ? PELVIC LAPAROSCOPY    ? RADIOACTIVE SEED GUIDED PARTIAL MASTECTOMY WITH AXILLARY SENTINEL LYMPH NODE BIOPSY Left 05/31/2015  ? Procedure: LEFT BREAST RADIOACTIVE SEED GUIDED LUMPECTOMY WITH LEFT AXILLARY SENTINEL LYMPH NODE BIOPSY;  Surgeon: [name], MD;  Location: [facility];  Service: General;  Laterality: Left;  ? RE-EXCISION OF BREAST LUMPECTOMY Left 07/01/2015  ?  Procedure: RE-EXCISION OF LEFT BREAST INFERIOR MARGIN;  Surgeon: [name], MD;  Location: [facility];  Service: General;  Laterality: Left;  ? UPPER GASTROINTESTINAL ENDOSCOPY    ? UPPER GI ENDOSCOPY  10/2010  ? Dr [name]  ? [place]  ? ? ?There were no vitals filed for this visit. ? ? Subjective Assessment - 09/11/21 1615   ? ? Subjective patient reprts that she dropped a heavy blender on her foot on Friday, she has a significant hematoma there with swelling.reports a little sore and a mild limp.   ? Currently in Pain? Yes   ? Pain Score 3    ? Pain Location Foot   ? Pain Orientation Right   ? Pain Descriptors / Indicators Sore   ? Aggravating Factors  dropping the blender really hurt   ? ?  ?  ? ?  ? ? ? ? ? ? ? ? ? ? ? ? ? ? ? ? ? ? ? ? [facility] Adult PT Treatment/Exercise - 09/11/21 0001   ? ?  ? Lumbar Exercises: Aerobic  ? Recumbent Bike level 3 x 5 minutes   ? Nustep level 5 x 6 minutes   ?  ? Lumbar Exercises: Machines  for Strengthening  ? Cybex Knee Flexion 25# 2x15   ? Other Lumbar Machine Exercise seated row 15 2x20#, lats 20# 2x15, 10# straight arm pulls, 25# triceps with ropes, 20# biceps 2x10   ?  ? Lumbar Exercises: Supine  ? Bridge with [employer] 20 reps;1 second   ? Bridge with clamshell 20 reps   ? Other Supine Lumbar Exercises feet on ball K2C, trunk rotation, small bridges and isometric abs   ?  ? Lumbar Exercises: Sidelying  ? Clam Both;20 reps;1 second   ? Clam Limitations 43   ? ?  ?  ? ?  ? ? ? ? ? ? ? ? ? ? ? ? PT Short Term Goals - 09/11/21 1723   ? ?  ? PT SHORT TERM GOAL #1  ? Title I with HEP   ? Status Achieved   ? ?  ?  ? ?  ? ? ? ? PT Long Term Goals - 09/11/21 1723   ? ?  ? PT LONG TERM GOAL #1  ? Title be able to stand up and walk without stooped posture   ? Status Partially Met   ?  ? PT LONG TERM GOAL #2  ? Title Patient able to perform ADLS without limitation in ROM   ? Status Partially Met   ? ?  ?  ? ?  ? ? ? ? ? ? ? ? Plan - 09/11/21  1651   ? ? Clinical Impression Statement Had an injury last week with a blender falling on the foot, no fracture.  She had a slight limp with pain and tenderness on the top of the foot.  She does report fatigue.  Overall prior to this injury she was doing very well and reported felling much improved after attending PT with her endurance and strength   ? PT Next Visit Plan continue to add core and flexibility, assure that the foot is okay and she is planning on a trip at the end of September   ? Consulted and Agree with Plan of Care Patient   ? ?  ?  ? ?  ? ? ?Patient will benefit from skilled therapeutic intervention in order to improve the following deficits and impairments:  Abnormal gait, Decreased range of motion, Difficulty walking, Increased muscle spasms, Decreased activity tolerance, Pain, Improper body mechanics, Impaired flexibility, Decreased mobility, Decreased strength ? ?Visit Diagnosis: ?Acute bilateral low back pain without sciatica - Plan: PT plan of care cert/re-cert ? ?Muscle spasm of calf - Plan: PT plan of care cert/re-cert ? ?Acute pain of right knee - Plan: PT plan of care cert/re-cert ? ? ? ? ?Problem List ?Patient Active Problem List  ? Diagnosis Date Noted  ? Rash 08/18/2021  ? Acute cough 03/29/2021  ? Light headedness 03/29/2021  ? Sore throat 03/29/2021  ? Pounding heartbeat 03/29/2021  ? Dermatitis 12/09/2020  ? Low back pain 10/18/2020  ? COVID-19 08/31/2020  ? Patellofemoral arthritis of right knee 08/25/2020  ? Left wrist pain 04/19/2020  ? Left leg pain 04/18/2020  ? Acute pain of right knee 03/09/2020  ? Sensorineural hearing loss (SNHL) of both ears 03/20/2019  ? Deviated nasal septum 03/02/2019  ? Pulmonary nodule 07/29/2018  ? Dupuytren's contracture of both hands 07/11/2018  ? Ganglion cyst of volar aspect of left wrist 06/04/2018  ? Frozen shoulder 03/18/2018  ? Right rotator cuff tear 10/15/2017  ? Tinnitus of both ears 01/01/2017  ? Reaction to internal stress 05/29/2016  ?  Prediabetes 08/10/2015  ? 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 Open-angle glaucoma 06/17/2014  ? Vitamin D deficiency 10/18/2011  ? HSV infection   ? Atrophic vaginitis   ? VARICOSE VEINS, LOWER EXTREMITIES 09/28/2009  ? GERD 07/14/2009  ? Hyperlipidemia 12/26/2006  ? GILBERT'S SYNDROME 08/09/2006  ? [place] DISEASE 08/09/2006  ? ? Sumner Boast, PT ?09/11/2021, 5:25 PM ? ?[facility] [place] ?[place] [address] ?[place], [place], [address] ?Phone: [phone]   Fax:  [phone] ? ?Name: Lindsey Barrett ?MRN: [id number] ?Date of Birth: [birth year] ? ? ? ?

## 2021-09-14 NOTE — Progress Notes (Signed)
Patient Care Team: [name], MD as PCP - General (Internal Medicine) Phineas Real, Belinda Block, MD (Inactive) as Consulting Physician (Gynecology) Armbruster, Carlota Raspberry, MD as Consulting Physician (Gastroenterology) Calvert Cantor, MD as Consulting Physician (Ophthalmology) Delice Bison Darnelle Maffucci, [facility] as Pharmacist (Pharmacist)  DIAGNOSIS:  Encounter Diagnosis  Name Primary?   Malignant neoplasm of upper-inner quadrant of left breast in female, estrogen receptor positive ([place])     SUMMARY OF ONCOLOGIC HISTORY: Oncology History  Breast cancer of upper-inner quadrant of left female breast ([place])  05/18/2015 Initial Diagnosis   Left breast biopsy: Invasive ductal carcinoma low to intermediate grade, ER 100%, PR 95%, HER-2 negative ratio 1.02, Ki-67 5%, left breast asymmetry 1.3 cm, T1c N0 stage IA    05/31/2015 Surgery   Left lumpectomy [name]): Invasive ductal carcinoma grade 2, 1.5 cm, 0/3 lymph nodes negative, broadly involves inferior margin, ER 100%, PR 95%, HER-2 negative duration 112, Ki-67 5%, T1 cN0 stage IA, Oncotype DX score 18, 12% ROR   05/31/2015 Oncotype testing   Oncotype DX score: 18/12% ROR    07/01/2015 Surgery   Reexcision of the left inferior margin: Fibrocystic changes with UDH    08/10/2015 - 09/16/2015 Radiation Therapy   Adj XRT ([place]). 50.4 Gy in 28 fractions to the left breast   10/06/2015 -  Anti-estrogen oral therapy   Anastrozole 1 mg by mouth daily 5 years, switched to letrozole 11/09/2015 due to dizziness lightheadedness and fatigue      CHIEF COMPLIANT: Follow-up of left breast cancer on letrozole     INTERVAL HISTORY: Lindsey Barrett is a  76 y.o. with above-mentioned history of left breast cancer and is currently on oral antiestrogen therapy with letrozole. She presents to the clinic today for follow-up. She states every now and then she gets pain In her lymph nodes and at the surgery site. She states that her hair, skin and insides drys out.      ALLERGIES:  is allergic to arimidex [anastrozole], latex, septra ds [sulfamethoxazole-trimethoprim], sulfa antibiotics, adhesive [tape], and augmentin [amoxicillin-pot clavulanate].  MEDICATIONS:  Current Outpatient Medications  Medication Sig Dispense Refill   aspirin EC 81 MG tablet Take 1 tablet (81 mg total) by mouth daily.     bimatoprost (LUMIGAN) 0.01 % SOLN Place 1 drop into both eyes at bedtime.     brinzolamide (AZOPT) 1 % ophthalmic suspension 1 drop 2 (two) times daily.     calcium carbonate (TUMS - DOSED IN MG ELEMENTAL CALCIUM) 500 MG chewable tablet Chew 1 tablet by mouth as needed for indigestion or heartburn (1000 mg).      Carboxymethylcellulose Sodium (REFRESH LIQUIGEL OP) Apply 1 drop to eye 4 (four) times daily as needed.     Cholecalciferol (VITAMIN D3 PO) Take 5,000 Int'l Units by mouth daily.      clotrimazole-betamethasone (LOTRISONE) cream Apply 1 application. topically daily. 30 g 0   ezetimibe (ZETIA) 10 MG tablet Take 1 tablet by mouth once daily 90 tablet 2   famotidine (PEPCID) 10 MG tablet Take 1 tablet (10 mg total) by mouth daily. (Patient taking differently: Take 10 mg by mouth. Daily (may take twice daily if needed)) 90 tablet 3   ibuprofen (ADVIL) 200 MG tablet Take 200 mg by mouth every 6 (six) hours as needed.     letrozole (FEMARA) 2.5 MG tablet Take 1 tablet (2.5 mg total) by mouth daily. 90 tablet 3   rosuvastatin (CRESTOR) 40 MG tablet Take 1 tablet (40 mg total) by mouth  daily. 90 tablet 3   sodium chloride (MURO 128) 5 % ophthalmic solution Place 1 drop into both eyes 2 (two) times a day. Before Azopt     Vaginal Lubricant (REPLENS) GEL Place 1 application vaginally daily.     No current facility-administered medications for this visit.    PHYSICAL EXAMINATION: ECOG PERFORMANCE STATUS: 1 - Symptomatic but completely ambulatory  Vitals:   09/21/21 1443  BP: 138/70  Pulse: 77  Resp: 18  Temp: 97.6 F (36.4 C)  SpO2: 99%   Filed  Weights   09/21/21 1443  Weight: 170 lb 14.4 oz (77.5 kg)    BREAST: No palpable masses or nodules in either right or left breasts. No palpable axillary supraclavicular or infraclavicular adenopathy no breast tenderness or nipple discharge. (exam performed in the presence of a chaperone)  LABORATORY DATA:  I have reviewed the data as listed    Latest Ref Rng & Units 05/26/2021    8:58 AM 10/18/2020    1:15 PM 02/19/2020    9:32 AM  CMP  Glucose 70 - 99 mg/dL 100   119   97    BUN 6 - 23 mg/dL '12   12   10    ' Creatinine 0.40 - 1.20 mg/dL 0.66   0.63   0.67    Sodium 135 - 145 mEq/L 141   138   141    Potassium 3.5 - 5.1 mEq/L 4.0   4.1   4.3    Chloride 96 - 112 mEq/L 106   104   105    CO2 19 - 32 mEq/L '30   28   31    ' Calcium 8.4 - 10.5 mg/dL 8.8   9.5     9.4   9.1    Total Protein 6.0 - 8.3 g/dL 6.7   7.1   7.0    Total Bilirubin 0.2 - 1.2 mg/dL 1.5   1.7   1.3    Alkaline Phos 39 - 117 U/L 48   51   53    AST 0 - 37 U/L '20   25   25    ' ALT 0 - 35 U/L '16   23   20      ' Lab Results  Component Value Date   WBC 7.0 05/26/2021   HGB 13.2 05/26/2021   HCT 40.4 05/26/2021   MCV 87.5 05/26/2021   PLT 218.0 05/26/2021   NEUTROABS 3.0 05/26/2021    ASSESSMENT & PLAN:  Breast cancer of upper-inner quadrant of left female breast ([place]) Left lumpectomy 05/31/2015: Invasive ductal carcinoma grade 2, 1.5 cm, 0/3 lymph nodes negative, broadly involves inferior margin, ER 100%, PR 95%, HER-2 negative, Ki-67 5%, T1 cN0 stage IA, Oncotype DX score 18, 12% ROR, Reexcision of margins negative   Adjuvant radiation therapy with Dr. Sondra Come 08/10/15- 09/16/15   Current treatment: Anastrozole 1 mg daily started June1st 2017 stopped 10/17/2015 due to dizziness lightheadedness and fatigue, switched to letrozole starting 11/09/2015   Letrozole toxicities: 1.  Hot flashes Patient is getting letrozole from [facility] and was able to tolerate it well.  She has 1 more left on the letrozole  therapy.  She was diagnosed with glaucoma and is getting eye checks frequently.  Marie surveillance: 1. Breast exam 09/21/2021: Mild lymphedema 2. M[facility]ogram [facility]/28/2021 at [facility]: Benign PET CT scan 07/29/2018: Evaluation of lung nodule: No hypermetabolic activity in the 8 mm right apical nodule   Return to clinic in 1 year  for follow-up and after that she can be seen on an as-needed basis.:     No orders of the defined types were placed in this encounter.  The patient has a good understanding of the overall plan. she agrees with it. she will call with any problems that may develop before the next visit here. Total time spent: 30 mins including face to face time and time spent for planning, charting and co-ordination of care   Harriette Ohara, MD 09/21/21    I Gardiner Coins am scribing for Dr. Lindi Adie  I have reviewed the above documentation for accuracy and completeness, and I agree with the above.

## 2021-09-15 ENCOUNTER — Ambulatory Visit: Payer: Medicare Other | Admitting: Hematology and Oncology

## 2021-09-18 ENCOUNTER — Encounter: Payer: Self-pay | Admitting: Physical Therapy

## 2021-09-18 ENCOUNTER — Ambulatory Visit: Payer: Medicare Other | Admitting: Physical Therapy

## 2021-09-18 DIAGNOSIS — M25531 Pain in right wrist: Secondary | ICD-10-CM | POA: Diagnosis not present

## 2021-09-18 DIAGNOSIS — M545 Low back pain, unspecified: Secondary | ICD-10-CM

## 2021-09-18 DIAGNOSIS — M25561 Pain in right knee: Secondary | ICD-10-CM | POA: Diagnosis not present

## 2021-09-18 DIAGNOSIS — M62831 Muscle spasm of calf: Secondary | ICD-10-CM | POA: Diagnosis not present

## 2021-09-18 DIAGNOSIS — M6281 Muscle weakness (generalized): Secondary | ICD-10-CM | POA: Diagnosis not present

## 2021-09-18 NOTE — Therapy (Signed)
[facility] ?[address] ?[place], [place], [address] ?Phone: [phone]   Fax:  [phone] ? ?Physical Therapy Treatment ? ?Patient Details  ?Name: Lindsey Barrett ?MRN: [id number] ?Date of Birth: 11/16/1945 ?Referring Provider (PT): Georgina Snell ? ? ?Encounter Date: 09/18/2021 ? ? PT End of Session - 09/18/21 1703   ? ? Visit Number 11   ? Date for PT Re-Evaluation 10/13/21   ? Authorization Type Medicare   ? PT Start Time 3532   ? PT Stop Time 1657   ? PT Time Calculation (min) 43 min   ? Activity Tolerance Patient tolerated treatment well   ? Behavior During Therapy [facility] for tasks assessed/performed   ? ?  ?  ? ?  ? ? ?Past Medical History:  ?Diagnosis Date  ? Anxiety   ? Atrophic vaginitis   ? Breast cancer ([place])   ? 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 Breast cyst   ? x3  ? DDD (degenerative disc disease)   ? Diverticulosis   ? Fasting hyperglycemia   ? GERD (gastroesophageal reflux disease)   ? [name] syndrome   ? Glaucoma   ? History of hiatal hernia   ? HSV infection   ? Hyperlipidemia   ? Radiation   ? left breast 50.4 gray  ? STD (sexually transmitted disease)   ? HSV  ? ? ?Past Surgical History:  ?Procedure Laterality Date  ? APPENDECTOMY  1961  ? BREAST CYST ASPIRATION    ? x2 right ,x1 left  ? COLONOSCOPY  2007  ? neg  ? [place] OF UTERUS  1975  ? FLEXIBLE SIGMOIDOSCOPY    ? x2  ? PELVIC LAPAROSCOPY    ? RADIOACTIVE SEED GUIDED PARTIAL MASTECTOMY WITH AXILLARY SENTINEL LYMPH NODE BIOPSY Left 05/31/2015  ? Procedure: LEFT BREAST RADIOACTIVE SEED GUIDED LUMPECTOMY WITH LEFT AXILLARY SENTINEL LYMPH NODE BIOPSY;  Surgeon: [name], MD;  Location: [facility];  Service: General;  Laterality: Left;  ? RE-EXCISION OF BREAST LUMPECTOMY Left 07/01/2015  ? Procedure: RE-EXCISION OF LEFT BREAST INFERIOR MARGIN;  Surgeon: [name], MD;  Location: [facility];   Service: General;  Laterality: Left;  ? UPPER GASTROINTESTINAL ENDOSCOPY    ? UPPER GI ENDOSCOPY  10/2010  ? Dr [name]  ? [place]  ? ? ?There were no vitals filed for this visit. ? ? Subjective Assessment - 09/18/21 1619   ? ? Subjective Patient reports that her foot is still hurting.  Some knee pain   ? Currently in Pain? Yes   ? Pain Score 4    ? Pain Location Knee   ? Pain Orientation Right   ? Aggravating Factors  with walking   ? ?  ?  ? ?  ? ? ? ? ? ? ? ? ? ? ? ? ? ? ? ? ? ? ? ? [facility] Adult PT Treatment/Exercise - 09/18/21 0001   ? ?  ? Lumbar Exercises: Aerobic  ? Recumbent Bike level 3.5 x 5 minutes   ? Nustep level 5 x 6 minutes   ?  ? Lumbar Exercises: Machines for Strengthening  ? Cybex Knee Flexion 25# 2x15   ? Other Lumbar Machine Exercise seated row 15 2x20#, lats 20# 2x15, 10# straight arm pulls, 25# triceps with ropes, 20# biceps 2x10   ?  ? Lumbar Exercises: Supine  ? Bridge with [employer]  20 reps;1 second   ? Bridge with clamshell 20 reps   ? Other Supine Lumbar Exercises feet on ball K2C, trunk rotation, small bridges and isometric abs   ? ?  ?  ? ?  ? ? ? ? ? ? ? ? ? ? ? ? PT Short Term Goals - 09/11/21 1723   ? ?  ? PT SHORT TERM GOAL #1  ? Title I with HEP   ? Status Achieved   ? ?  ?  ? ?  ? ? ? ? PT Long Term Goals - 09/11/21 1723   ? ?  ? PT LONG TERM GOAL #1  ? Title be able to stand up and walk without stooped posture   ? Status Partially Met   ?  ? PT LONG TERM GOAL #2  ? Title Patient able to perform ADLS without limitation in ROM   ? Status Partially Met   ? ?  ?  ? ?  ? ? ? ? ? ? ? ? Plan - 09/18/21 1703   ? ? Clinical Impression Statement Reports that she fatigues easily, knee hurts some, back is feeling better, but reports that she gets tight with standing and walking.   ? PT Next Visit Plan assess the need for function on the trio in September   ? Consulted and Agree with Plan of Care Patient   ? ?  ?  ? ?  ? ? ?Patient will benefit from skilled therapeutic  intervention in order to improve the following deficits and impairments:  Abnormal gait, Decreased range of motion, Difficulty walking, Increased muscle spasms, Decreased activity tolerance, Pain, Improper body mechanics, Impaired flexibility, Decreased mobility, Decreased strength ? ?Visit Diagnosis: ?Acute bilateral low back pain without sciatica ? ?Muscle spasm of calf ? ?Acute pain of right knee ? ? ? ? ?Problem List ?Patient Active Problem List  ? Diagnosis Date Noted  ? Rash 08/18/2021  ? Acute cough 03/29/2021  ? Light headedness 03/29/2021  ? Sore throat 03/29/2021  ? Pounding heartbeat 03/29/2021  ? Dermatitis 12/09/2020  ? Low back pain 10/18/2020  ? COVID-19 08/31/2020  ? Patellofemoral arthritis of right knee 08/25/2020  ? Left wrist pain 04/19/2020  ? Left leg pain 04/18/2020  ? Acute pain of right knee 03/09/2020  ? Sensorineural hearing loss (SNHL) of both ears 03/20/2019  ? Deviated nasal septum 03/02/2019  ? Pulmonary nodule 07/29/2018  ? Dupuytren's contracture of both hands 07/11/2018  ? Ganglion cyst of volar aspect of left wrist 06/04/2018  ? Frozen shoulder 03/18/2018  ? Right rotator cuff tear 10/15/2017  ? Tinnitus of both ears 01/01/2017  ? Reaction to internal stress 05/29/2016  ? Prediabetes 08/10/2015  ? 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 Open-angle glaucoma 06/17/2014  ? Vitamin D deficiency 10/18/2011  ? HSV infection   ? Atrophic vaginitis   ? VARICOSE VEINS, LOWER EXTREMITIES 09/28/2009  ? GERD 07/14/2009  ? Hyperlipidemia 12/26/2006  ? GILBERT'S SYNDROME 08/09/2006  ? [place] DISEASE 08/09/2006  ? ? Sumner Boast, PT ?09/18/2021, 5:05 PM ? ?[place] [address] ?[place], [place], [address] ?Phone: [phone]   Fax:  [phone] ? ?Name: Lindsey Barrett ?MRN: [id number] ?Date of Birth: 01/13/1946 ? ? ? ?

## 2021-09-20 ENCOUNTER — Ambulatory Visit: Payer: Medicare Other | Admitting: Physical Therapy

## 2021-09-20 ENCOUNTER — Encounter: Payer: Self-pay | Admitting: Physical Therapy

## 2021-09-20 DIAGNOSIS — M545 Low back pain, unspecified: Secondary | ICD-10-CM

## 2021-09-20 DIAGNOSIS — M62831 Muscle spasm of calf: Secondary | ICD-10-CM | POA: Diagnosis not present

## 2021-09-20 DIAGNOSIS — M6281 Muscle weakness (generalized): Secondary | ICD-10-CM | POA: Diagnosis not present

## 2021-09-20 DIAGNOSIS — M25531 Pain in right wrist: Secondary | ICD-10-CM

## 2021-09-20 DIAGNOSIS — M25561 Pain in right knee: Secondary | ICD-10-CM | POA: Diagnosis not present

## 2021-09-20 NOTE — Therapy (Signed)
[facility] ?[place], [place], [address] ?Phone: [phone]   Fax:  [phone] ? ?Physical Therapy Treatment ? ?Patient Details  ?Name: Lindsey Barrett ?MRN: [id number] ?Date of Birth: 1945/09/14 ?Referring Provider (PT): Georgina Snell ? ? ?Encounter Date: 09/20/2021 ? ? PT End of Session - 09/20/21 1530   ? ? Visit Number 12   ? Date for PT Re-Evaluation 10/13/21   ? Authorization Type Medicare   ? PT Start Time 4944   ? PT Stop Time 1530   ? PT Time Calculation (min) 45 min   ? Activity Tolerance Patient tolerated treatment well   ? Behavior During Therapy [facility] for tasks assessed/performed   ? ?  ?  ? ?  ? ? ?Past Medical History:  ?Diagnosis Date  ? Anxiety   ? Atrophic vaginitis   ? Breast cancer ([place])   ? Breast cancer of upper-inner quadrant of left female breast ([place]) 05/20/2015  ? Breast cyst   ? x3  ? DDD (degenerative disc disease)   ? Diverticulosis   ? Fasting hyperglycemia   ? GERD (gastroesophageal reflux disease)   ? [name] syndrome   ? Glaucoma   ? History of hiatal hernia   ? HSV infection   ? Hyperlipidemia   ? Radiation   ? left breast 50.4 gray  ? STD (sexually transmitted disease)   ? HSV  ? ? ?Past Surgical History:  ?Procedure Laterality Date  ? APPENDECTOMY  1961  ? BREAST CYST ASPIRATION    ? x2 right ,x1 left  ? COLONOSCOPY  2007  ? neg  ? [place] OF UTERUS  1975  ? FLEXIBLE SIGMOIDOSCOPY    ? x2  ? PELVIC LAPAROSCOPY    ? RADIOACTIVE SEED GUIDED PARTIAL MASTECTOMY WITH AXILLARY SENTINEL LYMPH NODE BIOPSY Left 05/31/2015  ? Procedure: LEFT BREAST RADIOACTIVE SEED GUIDED LUMPECTOMY WITH LEFT AXILLARY SENTINEL LYMPH NODE BIOPSY;  Surgeon: [name], MD;  Location: [place];  Service: General;  Laterality: Left;  ? RE-EXCISION OF BREAST LUMPECTOMY Left 07/01/2015  ? Procedure: RE-EXCISION OF LEFT BREAST INFERIOR MARGIN;  Surgeon: [name], MD;  Location: [place];   Service: General;  Laterality: Left;  ? UPPER GASTROINTESTINAL ENDOSCOPY    ? UPPER GI ENDOSCOPY  10/2010  ? Dr [name]  ? [place]  ? ? ?There were no vitals filed for this visit. ? ? Subjective Assessment - 09/20/21 1448   ? ? Subjective My knee is still hurting, very difficult with stairs   ? Currently in Pain? Yes   ? Pain Score 4    ? Pain Location Knee   ? Pain Orientation Right   ? Pain Descriptors / Indicators Sore;Tightness   ? Aggravating Factors  stairs   ? ?  ?  ? ?  ? ? ? ? ? ? ? ? ? ? ? ? ? ? ? ? ? ? ? ? [place] Adult PT Treatment/Exercise - 09/20/21 0001   ? ?  ? Ambulation/Gait  ? Gait Comments good pace walking two laps around the back building   ?  ? Lumbar Exercises: Aerobic  ? Nustep level 5 x 6 minutes   ?  ? Lumbar Exercises: Machines for Strengthening  ? Leg Press 40# 2x10   ? Other Lumbar Machine Exercise 5# hip extension and abduction 2x10   ? Other Lumbar Machine Exercise seated row 25 2x20#, lats 20# 2x15,  10# straight arm pulls, 25# triceps with ropes, 20# biceps 2x10   ?  ? Lumbar Exercises: Standing  ? Other Standing Lumbar Exercises 20# farmer carry   ? ?  ?  ? ?  ? ? ? ? ? ? ? ? ? ? ? ? PT Short Term Goals - 09/11/21 1723   ? ?  ? PT SHORT TERM GOAL #1  ? Title I with HEP   ? Status Achieved   ? ?  ?  ? ?  ? ? ? ? PT Long Term Goals - 09/20/21 1534   ? ?  ? PT LONG TERM GOAL #1  ? Title be able to stand up and walk without stooped posture   ? Status Achieved   ? ?  ?  ? ?  ? ? ? ? ? ? ? ? Plan - 09/20/21 1530   ? ? Clinical Impression Statement PAtient did well with the walking able to maintain and brisk pace and without knee pain.  She does have weak hips and struggled with hip abduction, showed her how to do with tband at home, may need to see this again, we did talk about after PT ideas to keep going and prepare for her trip   ? PT Next Visit Plan will look to plan for advanced HEP next week and D/C or hold after next week   ? Consulted and Agree with Plan of Care  Patient   ? ?  ?  ? ?  ? ? ?Patient will benefit from skilled therapeutic intervention in order to improve the following deficits and impairments:  Abnormal gait, Decreased range of motion, Difficulty walking, Increased muscle spasms, Decreased activity tolerance, Pain, Improper body mechanics, Impaired flexibility, Decreased mobility, Decreased strength ? ?Visit Diagnosis: ?Acute bilateral low back pain without sciatica ? ?Muscle spasm of calf ? ?Acute pain of right knee ? ?Pain in right wrist ? ? ? ? ?Problem List ?Patient Active Problem List  ? Diagnosis Date Noted  ? Rash 08/18/2021  ? Acute cough 03/29/2021  ? Light headedness 03/29/2021  ? Sore throat 03/29/2021  ? Pounding heartbeat 03/29/2021  ? Dermatitis 12/09/2020  ? Low back pain 10/18/2020  ? COVID-19 08/31/2020  ? Patellofemoral arthritis of right knee 08/25/2020  ? Left wrist pain 04/19/2020  ? Left leg pain 04/18/2020  ? Acute pain of right knee 03/09/2020  ? Sensorineural hearing loss (SNHL) of both ears 03/20/2019  ? Deviated nasal septum 03/02/2019  ? Pulmonary nodule 07/29/2018  ? Dupuytren's contracture of both hands 07/11/2018  ? Ganglion cyst of volar aspect of left wrist 06/04/2018  ? Frozen shoulder 03/18/2018  ? Right rotator cuff tear 10/15/2017  ? Tinnitus of both ears 01/01/2017  ? Reaction to internal stress 05/29/2016  ? Prediabetes 08/10/2015  ? Breast cancer of upper-inner quadrant of left female breast ([place]) 05/20/2015  ? Open-angle glaucoma 06/17/2014  ? Vitamin D deficiency 10/18/2011  ? HSV infection   ? Atrophic vaginitis   ? VARICOSE VEINS, LOWER EXTREMITIES 09/28/2009  ? GERD 07/14/2009  ? Hyperlipidemia 12/26/2006  ? GILBERT'S SYNDROME 08/09/2006  ? [place] DISEASE 08/09/2006  ? ? Sumner Boast, PT ?09/20/2021, 3:34 PM ? ?[facility] [facility] ?[address] ?[place], [place], [address] ?Phone: [phone]   Fax:  [phone] ? ?Name: GARNET CHATMON ?MRN:  [id number] ?Date of Birth: [birth year] ? ? ? ?

## 2021-09-21 ENCOUNTER — Inpatient Hospital Stay: Payer: Medicare Other | Attending: Hematology and Oncology | Admitting: Hematology and Oncology

## 2021-09-21 ENCOUNTER — Other Ambulatory Visit: Payer: Self-pay

## 2021-09-21 DIAGNOSIS — Z923 Personal history of irradiation: Secondary | ICD-10-CM | POA: Insufficient documentation

## 2021-09-21 DIAGNOSIS — Z170 Estrogen receptor positive status [ER+]: Secondary | ICD-10-CM

## 2021-09-21 DIAGNOSIS — Z79811 Long term (current) use of aromatase inhibitors: Secondary | ICD-10-CM | POA: Diagnosis not present

## 2021-09-21 DIAGNOSIS — C50212 Malignant neoplasm of upper-inner quadrant of left female breast: Secondary | ICD-10-CM | POA: Diagnosis not present

## 2021-09-21 NOTE — Assessment & Plan Note (Addendum)
Left lumpectomy 05/31/2015: Invasive ductal carcinoma grade 2, 1.5 cm, 0/3 lymph nodes negative, broadly involves inferior margin, ER 100%, PR 95%, HER-2 negative, Ki-67 5%, T1 cN0 stage IA, Oncotype DX score 18, 12% ROR, Reexcision of margins negative  Adjuvant radiation therapy with Dr. Sondra Come 08/10/15- 09/16/15  Current treatment: Anastrozole 1 mg daily started June1st 2017 stopped 10/17/2015 due to dizziness lightheadedness and fatigue, switched to letrozole starting 11/09/2015  Letrozole toxicities: 1.Hot flashes [facility] and was able to tolerate [facility] well.  She has 1 more left on the letrozole therapy.  She was diagnosed with glaucoma and is getting eye checks frequently.  Marie surveillance: 1. Breast exam5/18/2023: Mild lymphedema 2. Mammogram2/22/2023at [facility]: Benign PET CT scan 07/29/2018: Evaluation of lung nodule: No hypermetabolic activity in the 8 mm right apical nodule  Return to clinic in1 year for follow-upand after that she can be seen on an as-needed basis.[name]

## 2021-09-22 ENCOUNTER — Ambulatory Visit: Payer: Medicare Other | Admitting: Hematology and Oncology

## 2021-09-25 ENCOUNTER — Ambulatory Visit: Payer: Medicare Other | Admitting: Physical Therapy

## 2021-09-25 ENCOUNTER — Encounter: Payer: Self-pay | Admitting: Physical Therapy

## 2021-09-25 DIAGNOSIS — M6281 Muscle weakness (generalized): Secondary | ICD-10-CM | POA: Diagnosis not present

## 2021-09-25 DIAGNOSIS — M545 Low back pain, unspecified: Secondary | ICD-10-CM

## 2021-09-25 DIAGNOSIS — M25531 Pain in right wrist: Secondary | ICD-10-CM | POA: Diagnosis not present

## 2021-09-25 DIAGNOSIS — M25561 Pain in right knee: Secondary | ICD-10-CM

## 2021-09-25 DIAGNOSIS — M62831 Muscle spasm of calf: Secondary | ICD-10-CM

## 2021-09-25 NOTE — Therapy (Signed)
[address] [place], [place], [address] Phone: [phone]   Fax:  [phone]  Physical Therapy Treatment  Patient Details  Name: Lindsey Barrett MRN: [id number] Date of Birth: 09-02-1945 Referring Provider (PT): Georgina Snell   Encounter Date: 09/25/2021   PT End of Session - 09/25/21 1621     Visit Number 13    Date for PT Re-Evaluation 10/13/21    Authorization Type Medicare    PT Start Time 1615    PT Stop Time 1700    PT Time Calculation (min) 45 min    Activity Tolerance Patient tolerated treatment well    Behavior During Therapy [facility] for tasks assessed/performed             Past Medical History:  Diagnosis Date   Anxiety    Atrophic vaginitis    Breast cancer ([place])    Breast cancer of upper-inner quadrant of left female breast ([place]) 05/20/2015   Breast cyst    x3   DDD (degenerative disc disease)    Diverticulosis    Fasting hyperglycemia    GERD (gastroesophageal reflux disease)    [name] syndrome    Glaucoma    History of hiatal hernia    HSV infection    Hyperlipidemia    Radiation    left breast 50.4 gray   STD (sexually transmitted disease)    HSV    Past Surgical History:  Procedure Laterality Date   APPENDECTOMY  1961   BREAST CYST ASPIRATION     x2 right ,x1 left   COLONOSCOPY  2007   neg   DILATION AND CURETTAGE OF UTERUS  1975   FLEXIBLE SIGMOIDOSCOPY     x2   PELVIC LAPAROSCOPY     RADIOACTIVE SEED GUIDED PARTIAL MASTECTOMY WITH AXILLARY SENTINEL LYMPH NODE BIOPSY Left 05/31/2015   Procedure: LEFT BREAST RADIOACTIVE SEED GUIDED LUMPECTOMY WITH LEFT AXILLARY SENTINEL LYMPH NODE BIOPSY;  Surgeon: [name], MD;  Location: [place];  Service: General;  Laterality: Left;   RE-EXCISION OF BREAST LUMPECTOMY Left 07/01/2015   Procedure: RE-EXCISION OF LEFT BREAST INFERIOR MARGIN;  Surgeon: [name], MD;  Location: [place];   Service: General;  Laterality: Left;   UPPER GASTROINTESTINAL ENDOSCOPY     UPPER GI ENDOSCOPY  10/2010   Dr [name] TOOTH EXTRACTION  1978    There were no vitals filed for this visit.   Subjective Assessment - 09/25/21 1621     Subjective I think I am feeling better, less issues and feeling a little stronger    Currently in Pain? Yes    Pain Score 2     Pain Location Knee    Pain Orientation Right    Aggravating Factors  stairs                               [facility] Adult PT Treatment/Exercise - 09/25/21 0001       Ambulation/Gait   Gait Comments 1 and a half laps with going into the back building and did two flights of stairs      Lumbar Exercises: Aerobic   Nustep level 5 x 6 minutes      Lumbar Exercises: Machines for Strengthening   Cybex Knee Extension 10# 2x15    Cybex Knee Flexion 25# 2x15    Leg Press 40# 2x10    Other Lumbar Machine Exercise  5# hip extension and abduction 2x10    Other Lumbar Machine Exercise seated row 25# 2x20#, lats 20# 2x15, 10# straight arm pulls, 25# triceps with ropes, 20# biceps 2x10      Lumbar Exercises: Standing   Other Standing Lumbar Exercises 20# farmer carry                       PT Short Term Goals - 09/11/21 1723       PT SHORT TERM GOAL #1   Title I with HEP    Status Achieved               PT Long Term Goals - 09/25/21 1622       PT LONG TERM GOAL #3   Title Patient able to perform ADLs with no pain.    Status Partially Met                   Plan - 09/25/21 1652     Clinical Impression Statement Patient doing well, less pain and less difficulty with exercises, the hip exercises are still the most diffiuclt for her, She reports that she is really feeling stronger    PT Next Visit Plan will look to plan for advanced HEP next week and D/C or hold after next week    Consulted and Agree with Plan of Care Patient             Patient will benefit from  skilled therapeutic intervention in order to improve the following deficits and impairments:  Abnormal gait, Decreased range of motion, Difficulty walking, Increased muscle spasms, Decreased activity tolerance, Pain, Improper body mechanics, Impaired flexibility, Decreased mobility, Decreased strength  Visit Diagnosis: Acute bilateral low back pain without sciatica  Muscle spasm of calf  Acute pain of right knee  Pain in right wrist     Problem List Patient Active Problem List   Diagnosis Date Noted   Rash 08/18/2021   Acute cough 03/29/2021   Light headedness 03/29/2021   Sore throat 03/29/2021   Pounding heartbeat 03/29/2021   Dermatitis 12/09/2020   Low back pain 10/18/2020   COVID-19 08/31/2020   Patellofemoral arthritis of right knee 08/25/2020   Left wrist pain 04/19/2020   Left leg pain 04/18/2020   Acute pain of right knee 03/09/2020   Sensorineural hearing loss (SNHL) of both ears 03/20/2019   Deviated nasal septum 03/02/2019   Pulmonary nodule 07/29/2018   Dupuytren's contracture of both hands 07/11/2018   Ganglion cyst of volar aspect of left wrist 06/04/2018   Frozen shoulder 03/18/2018   Right rotator cuff tear 10/15/2017   Tinnitus of both ears 01/01/2017   Reaction to internal stress 05/29/2016   Prediabetes 08/10/2015   Breast cancer of upper-inner quadrant of left female breast ([place]) 05/20/2015   Open-angle glaucoma 06/17/2014   Vitamin D deficiency 10/18/2011   HSV infection    Atrophic vaginitis    VARICOSE VEINS, LOWER EXTREMITIES 09/28/2009   GERD 07/14/2009   Hyperlipidemia 12/26/2006   GILBERT'S SYNDROME 08/09/2006   DEGENERATIVE DISC DISEASE 08/09/2006    Sumner Boast, PT 09/25/2021, 4:58 PM  [address] [place], [place], [address] Phone: [phone]   Fax:  [phone]  Name: Lindsey Barrett MRN: [id number] Date of Birth: 02-29-1946

## 2021-09-28 ENCOUNTER — Ambulatory Visit: Payer: Medicare Other | Admitting: Physical Therapy

## 2021-09-28 ENCOUNTER — Encounter: Payer: Self-pay | Admitting: Physical Therapy

## 2021-09-28 DIAGNOSIS — M25531 Pain in right wrist: Secondary | ICD-10-CM | POA: Diagnosis not present

## 2021-09-28 DIAGNOSIS — M545 Low back pain, unspecified: Secondary | ICD-10-CM | POA: Diagnosis not present

## 2021-09-28 DIAGNOSIS — M6281 Muscle weakness (generalized): Secondary | ICD-10-CM

## 2021-09-28 DIAGNOSIS — M62831 Muscle spasm of calf: Secondary | ICD-10-CM | POA: Diagnosis not present

## 2021-09-28 DIAGNOSIS — M25561 Pain in right knee: Secondary | ICD-10-CM | POA: Diagnosis not present

## 2021-09-28 NOTE — Therapy (Signed)
[address] [place], [place], [address] Phone: [phone]   Fax:  [phone]  Physical Therapy Treatment  Patient Details  Name: Lindsey Barrett MRN: [id number] Date of Birth: [birth year] Referring Provider (PT): Georgina Snell   Encounter Date: 09/28/2021   PT End of Session - 09/28/21 1526     Visit Number 14    Date for PT Re-Evaluation 10/13/21    Authorization Type Medicare    PT Start Time 1443    PT Stop Time 1526    PT Time Calculation (min) 43 min    Activity Tolerance Patient tolerated treatment well    Behavior During Therapy [facility] for tasks assessed/performed             Past Medical History:  Diagnosis Date   Anxiety    Atrophic vaginitis    Breast cancer ([place])    Breast cancer of upper-inner quadrant of left female breast ([place]) 05/20/2015   Breast cyst    x3   DDD (degenerative disc disease)    Diverticulosis    Fasting hyperglycemia    GERD (gastroesophageal reflux disease)    [name] syndrome    Glaucoma    History of hiatal hernia    HSV infection    Hyperlipidemia    Radiation    left breast 50.4 gray   STD (sexually transmitted disease)    HSV    Past Surgical History:  Procedure Laterality Date   APPENDECTOMY  1961   BREAST CYST ASPIRATION     x2 right ,x1 left   COLONOSCOPY  2007   neg   DILATION AND CURETTAGE OF UTERUS  1975   FLEXIBLE SIGMOIDOSCOPY     x2   PELVIC LAPAROSCOPY     RADIOACTIVE SEED GUIDED PARTIAL MASTECTOMY WITH AXILLARY SENTINEL LYMPH NODE BIOPSY Left 05/31/2015   Procedure: LEFT BREAST RADIOACTIVE SEED GUIDED LUMPECTOMY WITH LEFT AXILLARY SENTINEL LYMPH NODE BIOPSY;  Surgeon: [name], MD;  Location: [place];  Service: General;  Laterality: Left;   RE-EXCISION OF BREAST LUMPECTOMY Left 07/01/2015   Procedure: RE-EXCISION OF LEFT BREAST INFERIOR MARGIN;  Surgeon: [name], MD;  Location: [place];   Service: General;  Laterality: Left;   UPPER GASTROINTESTINAL ENDOSCOPY     UPPER GI ENDOSCOPY  10/2010   Dr [name] TOOTH EXTRACTION  1978    There were no vitals filed for this visit.   Subjective Assessment - 09/28/21 1523     Subjective Feeling good, no problems    Currently in Pain? No/denies                               [facility] Adult PT Treatment/Exercise - 09/28/21 0001       Ambulation/Gait   Gait Comments 1 lap fast pace      Lumbar Exercises: Machines for Strengthening   Cybex Knee Extension 10# 2x15    Cybex Knee Flexion 25# 2x15                       PT Short Term Goals - 09/11/21 1723       PT SHORT TERM GOAL #1   Title I with HEP    Status Achieved               PT Long Term Goals - 09/28/21 1523  PT LONG TERM GOAL #1   Title be able to stand up and walk without stooped posture    Status Achieved      PT LONG TERM GOAL #2   Title Patient able to perform ADLS without limitation in ROM    Status Achieved      PT LONG TERM GOAL #3   Title Patient able to perform ADLs with no pain.    Status Achieved      PT LONG TERM GOAL #4   Title increase lumbar ROM to WFL's    Status Achieved      PT LONG TERM GOAL #5   Title independent with advanced gym program    Status Achieved                   Plan - 09/28/21 1524     Clinical Impression Statement Patient did great, no pain, I went over and gave her a handout of all the gym exercises the weight and reps that we did here, went over form and safety and then also a walking program and the HEP if she elects to not go to the gym, she was able to demo with minimal guidance    PT Next Visit Plan D/C goals met    Consulted and Agree with Plan of Care Patient             Patient will benefit from skilled therapeutic intervention in order to improve the following deficits and impairments:  Abnormal gait, Decreased range of motion, Difficulty  walking, Increased muscle spasms, Decreased activity tolerance, Pain, Improper body mechanics, Impaired flexibility, Decreased mobility, Decreased strength  Visit Diagnosis: Muscle weakness (generalized)     Problem List Patient Active Problem List   Diagnosis Date Noted   Rash 08/18/2021   Acute cough 03/29/2021   Light headedness 03/29/2021   Sore throat 03/29/2021   Pounding heartbeat 03/29/2021   Dermatitis 12/09/2020   Low back pain 10/18/2020   COVID-19 08/31/2020   Patellofemoral arthritis of right knee 08/25/2020   Left wrist pain 04/19/2020   Left leg pain 04/18/2020   Acute pain of right knee 03/09/2020   Sensorineural hearing loss (SNHL) of both ears 03/20/2019   Deviated nasal septum 03/02/2019   Pulmonary nodule 07/29/2018   Dupuytren's contracture of both hands 07/11/2018   Ganglion cyst of volar aspect of left wrist 06/04/2018   Frozen shoulder 03/18/2018   Right rotator cuff tear 10/15/2017   Tinnitus of both ears 01/01/2017   Reaction to internal stress 05/29/2016   Prediabetes 08/10/2015   Breast cancer of upper-inner quadrant of left female breast ([place]) 05/20/2015   Open-angle glaucoma 06/17/2014   Vitamin D deficiency 10/18/2011   HSV infection    Atrophic vaginitis    VARICOSE VEINS, LOWER EXTREMITIES 09/28/2009   GERD 07/14/2009   Hyperlipidemia 12/26/2006   GILBERT'S SYNDROME 08/09/2006   DEGENERATIVE DISC DISEASE 08/09/2006    Sumner Boast, PT 09/28/2021, 3:27 PM  [address] [place], [place], [address] Phone: [phone]   Fax:  [phone]  Name: Lindsey Barrett MRN: [id number] Date of Birth: 21-Aug-1945

## 2021-10-05 ENCOUNTER — Ambulatory Visit (INDEPENDENT_AMBULATORY_CARE_PROVIDER_SITE_OTHER): Payer: Medicare Other

## 2021-10-05 ENCOUNTER — Ambulatory Visit (INDEPENDENT_AMBULATORY_CARE_PROVIDER_SITE_OTHER): Payer: Medicare Other | Admitting: Sports Medicine

## 2021-10-05 VITALS — BP 124/80 | HR 64 | Ht 64.0 in | Wt 170.0 lb

## 2021-10-05 DIAGNOSIS — M79642 Pain in left hand: Secondary | ICD-10-CM

## 2021-10-05 DIAGNOSIS — M1812 Unilateral primary osteoarthritis of first carpometacarpal joint, left hand: Secondary | ICD-10-CM | POA: Diagnosis not present

## 2021-10-05 MED ORDER — MELOXICAM 7.5 MG PO TABS: 7.5000 mg | ORAL_TABLET | Freq: Every day | ORAL | 0 refills | Status: DC

## 2021-10-05 NOTE — Progress Notes (Signed)
Benito Mccreedy D.[place] Phone: [phone]   Assessment and Plan:     1. Left hand pain 2. 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  My interpretation: No acute fracture or dislocation.  Moderate cortical changes at [facility] joint - Start meloxicam 7.5 mg daily x10 days.  Instructed not to use other NSAIDs while taking meloxicam.  May use Tylenol for breakthrough pain - Use wrist brace when active through the day, making sure to come out of the brace at least to 3 times a day for ROM to prevent stiffness   Pertinent previous records reviewed include none   Follow Up: As needed if no improvement or worsening of symptoms   Subjective:   I, Moenique Parris, am serving as a Education administrator for Doctor Glennon Mac  Chief Complaint: left hand injury   HPI:   10/05/21 Patient is a 76 year old female complaining of left hand pain. Patient states that last night she went to pick up a gallon of milk and felt a sharp, stabbing pain located primarily at left [facility] joint based on patient description.  Pain has mildly decreased after ice and rest for the past 24 hours.  Patient says that she had a fairly active week including being active around the house and garage, crocheting.  Has had thumb pain in the past on this left thumb, however this was sharper and more painful than she is experienced in the past.  Has not used NSAIDs/Tylenol.  Denies further episodes of identical sharp stabbing pain.  Denies weakness, numbness/tingling in extremities.  Relevant Historical Information: History of breast cancer  Additional pertinent review of systems negative.   Current Outpatient Medications:    meloxicam (MOBIC) 7.5 MG tablet, Take 1 tablet (7.5 mg total) by mouth daily.,  Disp: 10 tablet, Rfl: 0   aspirin EC 81 MG tablet, Take 1 tablet (81 mg total) by mouth daily., Disp:  , Rfl:    bimatoprost (LUMIGAN) 0.01 % SOLN, Place 1 drop into both eyes at bedtime., Disp: , Rfl:    brinzolamide (AZOPT) 1 % ophthalmic suspension, 1 drop 2 (two) times daily., Disp: , Rfl:    calcium carbonate (TUMS - DOSED IN MG ELEMENTAL CALCIUM) 500 MG chewable tablet, Chew 1 tablet by mouth as needed for indigestion or heartburn (1000 mg). , Disp: , Rfl:    Carboxymethylcellulose Sodium (REFRESH LIQUIGEL OP), Apply 1 drop to eye 4 (four) times daily as needed., Disp: , Rfl:    Cholecalciferol (VITAMIN D3 PO), Take 5,000 Int'l Units by mouth daily. , Disp: , Rfl:    clotrimazole-betamethasone (LOTRISONE) cream, Apply 1 application. topically daily., Disp: 30 g, Rfl: 0   ezetimibe (ZETIA) 10 MG tablet, Take 1 tablet by mouth once daily, Disp: 90 tablet, Rfl: 2   famotidine (PEPCID) 10 MG tablet, Take 1 tablet (10 mg total) by mouth daily. (Patient taking differently: Take 10 mg by mouth. Daily (may take twice daily if needed)), Disp: 90 tablet, Rfl: 3   ibuprofen (ADVIL) 200 MG tablet, Take 200 mg by mouth every 6 (six) hours as needed., Disp: , Rfl:    letrozole (FEMARA) 2.5 MG tablet, Take 1 tablet (2.5 mg total) by mouth daily., Disp: 90 tablet, Rfl: 3   rosuvastatin (CRESTOR) 40 MG tablet, Take 1  tablet (40 mg total) by mouth daily., Disp: 90 tablet, Rfl: 3   sodium chloride (MURO 128) 5 % ophthalmic solution, Place 1 drop into both eyes 2 (two) times a day. Before Azopt, Disp: , Rfl:    Vaginal Lubricant (REPLENS) GEL, Place 1 application vaginally daily., Disp: , Rfl:    Objective:     Vitals:   10/05/21 1134  BP: 124/80  Pulse: 64  SpO2: 97%  Weight: 170 lb (77.1 kg)  Height: '5\' 4"'$  (1.626 m)      Body mass index is 29.18 kg/m.    Physical Exam:    General: Appears well, nad, nontoxic and pleasant Neuro:sensation intact, strength is 5/5 with df/pf/inv/ev, muscle tone  wnl Skin:no susupicious lesions or rashes  Left hand/wrist:  No deformity or swelling appreciated. Wrist ROM  Ext 90, flexion70, radial/ulnar deviation 30 No pain with range of motion of first digit TTP moderately at [facility] nttp over the snauff box, dorsal carpals, volar carpals, radial styloid, ulnar styloid, 1st mcp, tfcc Negative Tinel's Negative finklestein Neg tfcc bounce test No pain with resisted ext, flex or deviation first digit   Electronically signed by:  Benito Mccreedy D.[facility] 1:21 PM 10/05/21

## 2021-10-05 NOTE — Patient Instructions (Signed)
Good to see you  - Start meloxicam 7.5 mg daily for 10 days .  I Can use wrist brace as needed As needed follow up

## 2021-10-06 DIAGNOSIS — H43811 Vitreous degeneration, right eye: Secondary | ICD-10-CM | POA: Diagnosis not present

## 2021-10-06 DIAGNOSIS — H53453 Other localized visual field defect, bilateral: Secondary | ICD-10-CM | POA: Diagnosis not present

## 2021-10-06 DIAGNOSIS — H40053 Ocular hypertension, bilateral: Secondary | ICD-10-CM | POA: Diagnosis not present

## 2021-10-17 NOTE — Progress Notes (Unsigned)
Lindsey Barrett [address] [place] Phone: [phone] Subjective:   IVilma Barrett, am serving as a scribe for Dr. Hulan Saas.  I'm seeing this patient by the request  of:  [name], MD  CC: back and knee pain   [id number]  07/19/2021 Known arthritic changes but seems to be doing relatively well.  Worsening pain will consider the possibility of injection.  Discussed with patient at great length.  Patient has been making some progress overall.  Discussed icing regimen and home exercise, patient has been doing very well with the formal physical therapist.  Encouraged her to continue to work with her.  Patient does have a trip planned for October when she is likely going to be doing a lot of increasing in walking.  Hopefully this will make improvement so she can enjoy.  Follow-up with me again in 3 months  Updated 10/18/2021 Lindsey Barrett is a 76 y.o. female coming in with complaint of knee and back pain. Did shee Dr. Glennon Mac for left hand pain and found to have [place] arthritis. Right knee is doing better. Still pain when going up and down steps. Back is doing well. Knows she needs to be doing core exercises. Left foot not having any pain. Just a knot. Sharp pain in left wrist. Wearing the brace has been helping.  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   Breast cyst    x3   DDD (degenerative disc disease)    Diverticulosis    Fasting hyperglycemia    GERD (gastroesophageal reflux disease)    [name] syndrome    Glaucoma    History of hiatal hernia    HSV infection    Hyperlipidemia    Radiation    left breast 50.4 gray   STD (sexually transmitted disease)    HSV   Past Surgical History:  Procedure Laterality Date   APPENDECTOMY  1961    BREAST CYST ASPIRATION     x2 right ,x1 left   COLONOSCOPY  2007   neg   DILATION AND CURETTAGE OF UTERUS  1975   FLEXIBLE SIGMOIDOSCOPY     x2   PELVIC LAPAROSCOPY     RADIOACTIVE SEED GUIDED PARTIAL MASTECTOMY WITH AXILLARY SENTINEL LYMPH NODE BIOPSY Left 05/31/2015   Procedure: LEFT BREAST RADIOACTIVE SEED GUIDED LUMPECTOMY WITH LEFT AXILLARY SENTINEL LYMPH NODE BIOPSY;  Surgeon: [name], MD;  Location: [facility];  Service: General;  Laterality: Left;   RE-EXCISION OF BREAST LUMPECTOMY Left 07/01/2015   Procedure: RE-EXCISION OF LEFT BREAST INFERIOR MARGIN;  Surgeon: [name], MD;  Location: [facility];  Service: General;  Laterality: Left;   UPPER GASTROINTESTINAL ENDOSCOPY     UPPER GI ENDOSCOPY  10/2010   Dr [name] TOOTH EXTRACTION  1978   Social History   Socioeconomic History   Marital status: Divorced    Spouse name: Not on file   Number of children: 0   Years of education: Not on file   Highest education level: Not on file  Occupational History   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  Tobacco Use   Smoking status: Former    Types: Cigarettes    Quit date: 03/28/1999  Years since quitting: 22.5   Smokeless tobacco: Never   Tobacco comments:    smoked 1967-2000, up to 3/4 ppd  Vaping Use   Vaping Use: Never used  Substance and Sexual Activity   Alcohol use: Yes    Alcohol/week: 10.0 standard drinks of alcohol    Types: 10 Cans of beer per week   Drug use: No   Sexual activity: Not Currently    Birth control/protection: Post-menopausal    Comment: 1st intercourse 76 yo-More than 5 partners  Other Topics Concern   Not on file  Social History Narrative   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      Regular exercise-no   Social Determinants of Health   Financial Resource Strain: Low Risk  (04/24/2021)   Overall Financial Resource Strain (CARDIA)    Difficulty of  Paying Living Expenses: Not hard at all  Food Insecurity: No Food Insecurity (04/24/2021)   Hunger Vital Sign    Worried About Running Out of Food in the Last Year: Never true    Ran Out of Food in the Last Year: Never true  Transportation Needs: No Transportation Needs (04/24/2021)   PRAPARE - Hydrologist (Medical): No    Lack of Transportation (Non-Medical): No  Physical Activity: Insufficiently Active (04/24/2021)   Exercise Vital Sign    Days of Exercise per Week: 3 days    Minutes of Exercise per Session: 20 min  Stress: No Stress Concern Present (04/24/2021)   [place]    Feeling of Stress : Not at all  Social Connections: Moderately Integrated (04/24/2021)   Social Connection and Isolation Panel [NHANES]    Frequency of Communication with Friends and Family: Twice a week    Frequency of Social Gatherings with Friends and Family: Twice a week    Attends Religious Services: More than 4 times per year    Active Member of [employer] or Organizations: Yes    Attends Music therapist: More than 4 times per year    Marital Status: Divorced   Allergies  Allergen Reactions   Arimidex [Anastrozole] Other (See Comments)    Unable to function, loopy, dizzy, and mentally fogginess   Latex Rash and Other (See Comments)    Gloves, Band-Aids    Septra Ds [Sulfamethoxazole-Trimethoprim] Hives   Sulfa Antibiotics Hives   Adhesive [Tape] Rash   Augmentin [Amoxicillin-Pot Clavulanate] Diarrhea   Family History  Problem Relation Age of Onset   Lung cancer Mother        smoker   Endometriosis Mother    Cancer Mother        Bladder cancer   Stomach cancer Maternal Grandmother    Coronary artery disease Father    Cancer Father 58       intestinal carcinoid tumors of the ileum   Sudden death Sister        17 week old, congenital deformity   Liver cancer Maternal Uncle    Stroke  Paternal Grandfather 69   Leukemia Maternal Uncle    Breast cancer Paternal Aunt        great paternal aunt-Age 17's   Colon cancer Paternal Aunt        lived to 59; pat great aunt   Vascular Disease Paternal Grandmother        thoracic aneurysm   Esophageal cancer Neg Hx    Rectal cancer Neg  Hx    Pancreatic cancer Neg Hx      Current Outpatient Medications (Cardiovascular):    ezetimibe (ZETIA) 10 MG tablet, Take 1 tablet by mouth once daily   rosuvastatin (CRESTOR) 40 MG tablet, Take 1 tablet (40 mg total) by mouth daily.   Current Outpatient Medications (Analgesics):    aspirin EC 81 MG tablet, Take 1 tablet (81 mg total) by mouth daily.   ibuprofen (ADVIL) 200 MG tablet, Take 200 mg by mouth every 6 (six) hours as needed.   meloxicam (MOBIC) 7.5 MG tablet, Take 1 tablet (7.5 mg total) by mouth daily.   Current Outpatient Medications (Other):    bimatoprost (LUMIGAN) 0.01 % SOLN, Place 1 drop into both eyes at bedtime.   brinzolamide (AZOPT) 1 % ophthalmic suspension, 1 drop 2 (two) times daily.   calcium carbonate (TUMS - DOSED IN MG ELEMENTAL CALCIUM) 500 MG chewable tablet, Chew 1 tablet by mouth as needed for indigestion or heartburn (1000 mg).    Carboxymethylcellulose Sodium (REFRESH LIQUIGEL OP), Apply 1 drop to eye 4 (four) times daily as needed.   Cholecalciferol (VITAMIN D3 PO), Take 5,000 Int'l Units by mouth daily.    clotrimazole-betamethasone (LOTRISONE) cream, Apply 1 application. topically daily.   famotidine (PEPCID) 10 MG tablet, Take 1 tablet (10 mg total) by mouth daily. (Patient taking differently: Take 10 mg by mouth. Daily (may take twice daily if needed))   letrozole (FEMARA) 2.5 MG tablet, Take 1 tablet by mouth once daily   sodium chloride (MURO 128) 5 % ophthalmic solution, Place 1 drop into both eyes 2 (two) times a day. Before Azopt   Vaginal Lubricant (REPLENS) GEL, Place 1 application vaginally daily.   Reviewed prior external information  including notes and imaging from  primary care provider As well as notes that were available from care everywhere and other healthcare systems.  Past medical history, social, surgical and family history all reviewed in electronic medical record.  No pertanent information unless stated regarding to the chief complaint.   Review of Systems:  No headache, visual changes, nausea, vomiting, diarrhea, constipation, dizziness, abdominal pain, skin rash, fevers, chills, night sweats, weight loss, swollen lymph nodes, body aches, joint swelling, chest pain, shortness of breath, mood changes. POSITIVE muscle aches  Objective  Pulse 77, height '5\' 4"'$  (1.626 m), weight 172 lb (78 kg), SpO2 97 %.   General: No apparent distress alert and oriented x3 mood and affect normal, dressed appropriately.  HEENT: Pupils equal, extraocular movements intact  Respiratory: Patient's speak in full sentences and does not appear short of breath  Cardiovascular: No lower extremity edema, non tender, no erythema  Left thumb exam shows the patient does have some mild atrophy of the thenar eminence.  Positive grind test noted. Right foot exam shows the patient does have very mild protrusions of the mid foot.   Limited muscular skeletal ultrasound was performed and interpreted by Hulan Saas, M  Limited ultrasound of patient's left wrist shows that patient does have some moderate narrowing of the [facility] joint noted with hypoechoic changes still noted.  This is consistent with an effusion.  Impression: [facility] arthritis with effusion   Impression and Recommendations:     The above documentation has been reviewed and is accurate and complete Lyndal Pulley, DO

## 2021-10-18 ENCOUNTER — Ambulatory Visit: Payer: Medicare Other

## 2021-10-18 ENCOUNTER — Ambulatory Visit (INDEPENDENT_AMBULATORY_CARE_PROVIDER_SITE_OTHER): Payer: Medicare Other | Admitting: Family Medicine

## 2021-10-18 ENCOUNTER — Other Ambulatory Visit: Payer: Self-pay | Admitting: Hematology and Oncology

## 2021-10-18 VITALS — HR 77 | Ht 64.0 in | Wt 172.0 lb

## 2021-10-18 DIAGNOSIS — M79672 Pain in left foot: Secondary | ICD-10-CM | POA: Diagnosis not present

## 2021-10-18 DIAGNOSIS — M1812 Unilateral primary osteoarthritis of first carpometacarpal joint, left hand: Secondary | ICD-10-CM

## 2021-10-18 NOTE — Patient Instructions (Signed)
Good to see you! Wear the brace at night and with repetitive activity Ice and Voltaren on thumb See you again in 3 months

## 2021-10-18 NOTE — Assessment & Plan Note (Signed)
CMC arthritis with hypoechoic changes on ultrasound that is consistent with an effusion.  Patient declined any type of injection.  Discussed bracing at night and considering a thumb spica splint discussed icing regimen and topical anti-inflammatories.  Worsening pain patient will call us sooner.  Patient will follow-up with [place] again in 6 to 8 weeks otherwise.

## 2021-10-25 ENCOUNTER — Telehealth: Payer: Self-pay | Admitting: Internal Medicine

## 2021-10-25 NOTE — Telephone Encounter (Signed)
Pt called in stating she may have a UTI and would like a urine test ordered.  Please advise.

## 2021-10-26 ENCOUNTER — Ambulatory Visit (INDEPENDENT_AMBULATORY_CARE_PROVIDER_SITE_OTHER): Payer: Medicare Other | Admitting: Emergency Medicine

## 2021-10-26 ENCOUNTER — Encounter: Payer: Self-pay | Admitting: Emergency Medicine

## 2021-10-26 VITALS — BP 136/84 | HR 84 | Temp 98.4°F | Wt 171.1 lb

## 2021-10-26 DIAGNOSIS — R300 Dysuria: Secondary | ICD-10-CM | POA: Diagnosis not present

## 2021-10-26 LAB — POCT URINALYSIS DIPSTICK
Bilirubin, UA: NEGATIVE
Blood, UA: NEGATIVE
Glucose, UA: NEGATIVE
Ketones, UA: NEGATIVE
Leukocytes, UA: NEGATIVE
Nitrite, UA: NEGATIVE
Protein, UA: NEGATIVE
Spec Grav, UA: <=1.005 — AB (ref 1.010–1.025)
Urobilinogen, UA: 0.2 E.U./dL
pH, UA: 6 (ref 5.0–8.0)

## 2021-10-26 NOTE — Progress Notes (Signed)
Lindsey Barrett 76 y.o.   Chief Complaint  Patient presents with   Urinary Tract Infection    UTI symptoms started Tuesday , burning     HISTORY OF PRESENT ILLNESS: Acute problem visit today.  Patient of Dr. Billey Gosling. This is a 76 y.o. female complaining of slight burning noted during urination that started 4 days ago.  Not sure if this is a urinary tract infection or vaginal irritation. Denies abnormal vaginal bleeding.  Denies hematuria.  Non-smoker. Much better today than Monday. No other complaint or medical concerns today.  Urinary Tract Infection  Pertinent negatives include no chills, flank pain, frequency, hematuria, nausea, urgency or vomiting.     Prior to Admission medications   Medication Sig Start Date End Date Taking? Authorizing Provider  aspirin EC 81 MG tablet Take 1 tablet (81 mg total) by mouth daily. 09/15/19  Yes Nicholas Lose, MD  bimatoprost (LUMIGAN) 0.01 % SOLN Place 1 drop into both eyes at bedtime.   Yes [provider]  brinzolamide (AZOPT) 1 % ophthalmic suspension 1 drop 2 (two) times daily.   Yes [provider]  calcium carbonate (TUMS - DOSED IN MG ELEMENTAL CALCIUM) 500 MG chewable tablet Chew 1 tablet by mouth as needed for indigestion or heartburn (1000 mg).    Yes [provider]  Carboxymethylcellulose Sodium (REFRESH LIQUIGEL OP) Apply 1 drop to eye 4 (four) times daily as needed.   Yes [provider]  Cholecalciferol (VITAMIN D3 PO) Take 5,000 Int'l Units by mouth daily.    Yes [provider]  clotrimazole-betamethasone (LOTRISONE) cream Apply 1 application. topically daily. 08/18/21  Yes McElwee, Lauren A, NP  ezetimibe (ZETIA) 10 MG tablet Take 1 tablet by mouth once daily 04/24/21  Yes Troy Sine, MD  famotidine (PEPCID) 10 MG tablet Take 1 tablet (10 mg total) by mouth daily. Patient taking differently: Take 10 mg by mouth. Daily (may take twice daily if needed) 03/22/20  Yes  Armbruster, Carlota Raspberry, MD  ibuprofen (ADVIL) 200 MG tablet Take 200 mg by mouth every 6 (six) hours as needed.   Yes [provider]  letrozole [facility]) 2.5 MG tablet Take 1 tablet by mouth once daily 10/18/21  Yes Nicholas Lose, MD  rosuvastatin (CRESTOR) 40 MG tablet Take 1 tablet (40 mg total) by mouth daily. 04/25/21  Yes Burns, Claudina Lick, MD  sodium chloride (MURO 128) 5 % ophthalmic solution Place 1 drop into both eyes 2 (two) times a day. Before Azopt   Yes [provider]  Vaginal Lubricant (REPLENS) GEL Place 1 application vaginally daily.   Yes [provider]  meloxicam (MOBIC) 7.5 MG tablet Take 1 tablet (7.5 mg total) by mouth daily. Patient not taking: Reported on 10/26/2021 10/05/21   Glennon Mac, DO  metoprolol tartrate (LOPRESSOR) 25 MG tablet Take 1 tablet (25 mg total) by mouth as directed. 1 pill every 8 hours as needed for tachycardia or palpitations. 01/02/11 10/29/19  Hendricks Limes, MD    Allergies  Allergen Reactions   Arimidex [Anastrozole] Other (See Comments)    Unable to function, loopy, dizzy, and mentally fogginess   Latex Rash and Other (See Comments)    Gloves, Band-Aids    Septra Ds [Sulfamethoxazole-Trimethoprim] Hives   Sulfa Antibiotics Hives   Adhesive [Tape] Rash   Augmentin [Amoxicillin-Pot Clavulanate] Diarrhea    Patient Active Problem List   Diagnosis Date Noted   Arthritis of carpometacarpal [facility]) joint of left thumb 10/18/2021  Rash 08/18/2021   Acute cough 03/29/2021   Light headedness 03/29/2021   Sore throat 03/29/2021   Pounding heartbeat 03/29/2021   Dermatitis 12/09/2020   Low back pain 10/18/2020   COVID-19 08/31/2020   Patellofemoral arthritis of right knee 08/25/2020   Left wrist pain 04/19/2020   Left leg pain 04/18/2020   Acute pain of right knee 03/09/2020   Sensorineural hearing loss (SNHL) of both ears 03/20/2019   Deviated nasal septum 03/02/2019   Pulmonary nodule 07/29/2018   Dupuytren's  contracture of both hands 07/11/2018   Ganglion cyst of volar aspect of left wrist 06/04/2018   Frozen shoulder 03/18/2018   Right rotator cuff tear 10/15/2017   Tinnitus of both ears 01/01/2017   Reaction to internal stress 05/29/2016   Prediabetes 08/10/2015   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[place]) 05/20/2015   Open-angle glaucoma 06/17/2014   Vitamin D deficiency 10/18/2011   HSV infection    Atrophic vaginitis    VARICOSE VEINS, LOWER EXTREMITIES 09/28/2009   GERD 07/14/2009   Hyperlipidemia 12/26/2006   GILBERT'S SYNDROME 08/09/2006   DEGENERATIVE [place] DISEASE 08/09/2006    Past Medical History:  Diagnosis Date   Anxiety    Atrophic vaginitis    Breast cancer ([place])    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[place]) 05/20/2015   Breast cyst    x3   DDD (degenerative disc disease)    Diverticulosis    Fasting hyperglycemia    GERD (gastroesophageal reflux disease)    [name] syndrome    Glaucoma    History of hiatal hernia    HSV infection    Hyperlipidemia    Radiation    left breast 50.4 gray   STD (sexually transmitted disease)    HSV    Past Surgical History:  Procedure Laterality Date   APPENDECTOMY  1961   BREAST CYST ASPIRATION     x2 right ,x1 left   COLONOSCOPY  2007   neg   DILATION AND CURETTAGE OF UTERUS  1975   FLEXIBLE SIGMOIDOSCOPY     x2   PELVIC LAPAROSCOPY     RADIOACTIVE SEED GUIDED PARTIAL MASTECTOMY WITH AXILLARY SENTINEL LYMPH NODE BIOPSY Left 05/31/2015   Procedure: LEFT BREAST RADIOACTIVE SEED GUIDED LUMPECTOMY WITH LEFT AXILLARY SENTINEL LYMPH NODE BIOPSY;  Surgeon: [name], MD;  Location: [facility];  Service: General;  Laterality: Left;   RE-EXCISION OF BREAST LUMPECTOMY Left 07/01/2015   Procedure: RE-EXCISION OF LEFT BREAST INFERIOR MARGIN;  Surgeon: [name], MD;  Location: [place];  Service: General;  Laterality: Left;   UPPER  GASTROINTESTINAL ENDOSCOPY     UPPER GI ENDOSCOPY  10/2010   Dr [name] TOOTH EXTRACTION  1978    Social History   Socioeconomic History   Marital status: Divorced    Spouse name: Not on file   Number of children: 0   Years of education: Not on file   Highest education level: Not on file  Occupational History   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  Tobacco Use   Smoking status: Former    Types: Cigarettes    Quit date: 03/28/1999    Years since quitting: 22.5   Smokeless tobacco: Never   Tobacco comments:    smoked 1967-2000, up to 3/4 ppd  Vaping Use   Vaping Use: Never used  Substance and Sexual Activity   Alcohol use: Yes    Alcohol/week: 10.0 standard  drinks of alcohol    Types: 10 Cans of beer per week   Drug use: No   Sexual activity: Not Currently    Birth control/protection: Post-menopausal    Comment: 1st intercourse 76 yo-More than 5 partners  Other Topics Concern   Not on file  Social History Narrative   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      Regular exercise-no   Social Determinants of Health   Financial Resource Strain: Low Risk  (04/24/2021)   Overall Financial Resource Strain (CARDIA)    Difficulty of Paying Living Expenses: Not hard at all  Food Insecurity: No Food Insecurity (04/24/2021)   Hunger Vital Sign    Worried About Running Out of Food in the Last Year: Never true    Ran Out of Food in the Last Year: Never true  Transportation Needs: No Transportation Needs (04/24/2021)   PRAPARE - Hydrologist (Medical): No    Lack of Transportation (Non-Medical): No  Physical Activity: Insufficiently Active (04/24/2021)   Exercise Vital Sign    Days of Exercise per Week: 3 days    Minutes of Exercise per Session: 20 min  Stress: No Stress Concern Present (04/24/2021)   [place]    Feeling of Stress : Not at  all  Social Connections: Moderately Integrated (04/24/2021)   Social Connection and Isolation Panel [NHANES]    Frequency of Communication with Friends and Family: Twice a week    Frequency of Social Gatherings with Friends and Family: Twice a week    Attends Religious Services: More than 4 times per year    Active Member of [employer] or Organizations: Yes    Attends Music therapist: More than 4 times per year    Marital Status: Divorced  Intimate Partner Violence: Not At Risk (04/24/2021)   Humiliation, Afraid, Rape, and Kick questionnaire    Fear of Current or Ex-Partner: No    Emotionally Abused: No    Physically Abused: No    Sexually Abused: No    Family History  Problem Relation Age of Onset   Lung cancer Mother        smoker   Endometriosis Mother    Cancer Mother        Bladder cancer   Stomach cancer Maternal Grandmother    Coronary artery disease Father    Cancer Father 74       intestinal carcinoid tumors of the ileum   Sudden death Sister        77 week old, congenital deformity   Liver cancer Maternal Uncle    Stroke Paternal Grandfather 61   Leukemia Maternal Uncle    Breast cancer Paternal Aunt        great paternal aunt-Age 55's   Colon cancer Paternal Aunt        lived to 64; pat great aunt   Vascular Disease Paternal Grandmother        thoracic aneurysm   Esophageal cancer Neg Hx    Rectal cancer Neg Hx    Pancreatic cancer Neg Hx      Review of Systems  Constitutional: Negative.  Negative for chills and fever.  HENT: Negative.  Negative for congestion and sore throat.   Respiratory: Negative.  Negative for cough and shortness of breath.   Cardiovascular: Negative.  Negative for chest pain.  Gastrointestinal:  Negative for abdominal pain, diarrhea,  nausea and vomiting.  Genitourinary:  Positive for dysuria. Negative for flank pain, frequency, hematuria and urgency.  Skin: Negative.  Negative for rash.  Neurological:  Negative for  dizziness and headaches.  All other systems reviewed and are negative.  Today's Vitals   10/26/21 1348  BP: 136/84  Pulse: 84  Temp: 98.4 F (36.9 C)  TempSrc: Oral  SpO2: 95%  Weight: 171 lb 2 oz (77.6 kg)   Body mass index is 29.37 kg/m.   Physical Exam Vitals reviewed.  Constitutional:      Appearance: Normal appearance.  HENT:     Head: Normocephalic.  Eyes:     Extraocular Movements: Extraocular movements intact.  Cardiovascular:     Rate and Rhythm: Normal rate.  Pulmonary:     Effort: Pulmonary effort is normal.  Skin:    General: Skin is warm and dry.     Capillary Refill: Capillary refill takes less than 2 seconds.  Neurological:     General: No focal deficit present.     Mental Status: She is alert and oriented to person, place, and time.  Psychiatric:        Mood and Affect: Mood normal.        Behavior: Behavior normal.    Results for orders placed or performed in visit on 10/26/21 (from the past 24 hour(s))  POCT Urinalysis Dipstick     Status: Abnormal   Collection Time: 10/26/21  2:22 PM  Result Value Ref Range   Color, UA yellow    Clarity, UA clear    Glucose, UA Negative Negative   Bilirubin, UA negative    Ketones, UA negative    Spec Grav, UA <=1.005 (A) 1.010 - 1.025   Blood, UA negative    pH, UA 6.0 5.0 - 8.0   Protein, UA Negative Negative   Urobilinogen, UA 0.2 0.2 or 1.0 E.U./dL   Nitrite, UA negative    Leukocytes, UA Negative Negative   Appearance     Odor       ASSESSMENT & PLAN: A total of 30 minutes was spent with the patient and counseling/coordination of care regarding preparing for this visit, review of most recent office visit notes, review of multiple chronic medical problems under treatment, review of all medications, differential diagnosis of dysuria, prognosis, review of most recent blood work results including today's urinalysis, documentation and need for follow-up.  Problem List Items Addressed This Visit        Other   Dysuria - Primary    Most likely secondary to vaginal irritation.  Patient has history of atrophic vaginitis.  Normal urinalysis.  No findings of urinary tract infection. Significant improvement today compared to 3 days ago. Clinically stable and getting better.  No specific treatment needed at this time.  Advised to monitor symptoms and contact the office if no better or worse during the next several days.      Relevant Orders   POCT Urinalysis Dipstick (Completed)   Patient Instructions  Dysuria Dysuria is pain or discomfort during urination. The pain or discomfort may be felt in the part of the body that drains urine from the bladder (urethra) or in the surrounding tissue of the genitals. The pain may also be felt in the groin area, lower abdomen, or lower back. You may have to urinate frequently or have the sudden feeling that you have to urinate (urgency). Dysuria can affect anyone, but it is more common in females. Dysuria can be caused by many  different things, including: Urinary tract infection. Kidney stones or bladder stones. Certain STIs (sexually transmitted infections), such as chlamydia. Dehydration. Inflammation of the tissues of the vagina. Use of certain medicines. Use of certain soaps or scented products that cause irritation. Follow these instructions at home: Medicines Take over-the-counter and prescription medicines only as told by your health care provider. If you were prescribed an antibiotic medicine, take it as told by your health care provider. Do not stop taking the antibiotic even if you start to feel better. Eating and drinking  Drink enough fluid to keep your urine pale yellow. Avoid caffeinated beverages, tea, and alcohol. These beverages can irritate the bladder and make dysuria worse. In males, alcohol may irritate the prostate. General instructions Watch your condition for any changes. Urinate often. Avoid holding urine for long periods of  time. If you are female, you should wipe from front to back after urinating or having a bowel movement. Use each piece of toilet paper only once. Empty your bladder after sex. Keep all follow-up visits. This is important. If you had any tests done to find the cause of dysuria, it is up to you to get your test results. Ask your health care provider, or the department that is doing the test, when your results will be ready. Contact a health care provider if: You have a fever. You develop pain in your back or sides. You have nausea or vomiting. You have blood in your urine. You are not urinating as often as you usually do. Get help right away if: Your pain is severe and not relieved with medicines. You cannot eat or drink without vomiting. You are confused. You have a rapid heartbeat while resting. You have shaking or chills. You feel extremely weak. Summary Dysuria is pain or discomfort while urinating. Many different conditions can lead to dysuria. If you have dysuria, you may have to urinate frequently or have the sudden feeling that you have to urinate (urgency). Watch your condition for any changes. Keep all follow-up visits. Make sure that you urinate often and drink enough fluid to keep your urine pale yellow. This information is not intended to replace advice given to you by your health care provider. Make sure you discuss any questions you have with your health care provider. Document Revised: 12/04/2019 Document Reviewed: 12/04/2019 Elsevier Patient Education  [place], MD [facility]

## 2021-10-26 NOTE — Patient Instructions (Signed)

## 2021-10-26 NOTE — Assessment & Plan Note (Signed)
Most likely secondary to vaginal irritation.  Patient has history of atrophic vaginitis.  Normal urinalysis.  No findings of urinary tract infection. Significant improvement today compared to 3 days ago. Clinically stable and getting better.  No specific treatment needed at this time.  Advised to monitor symptoms and contact the office if no better or worse during the next several days.

## 2021-10-26 NOTE — Telephone Encounter (Signed)
Contacted patient and scheduled her for an office visit today.

## 2021-11-02 ENCOUNTER — Telehealth: Payer: Self-pay

## 2021-11-02 NOTE — Telephone Encounter (Signed)
Patient called. Last 10/23/21 Monday she started "feeling funny" like UTI sx.  On Thursday 10/26/21 she saw her PCP and was negative for UTI. Was having dysuria per diagnosis on visit.  She said since then sx have worsened. Mostly pain at end of urination with "bladder spasms". She is also experiencing intense vaginal itching.  I recommended OV and scheduled her to see Dr. Talbert Nan at 9:30am in the morning 11/03/21.

## 2021-11-02 NOTE — Progress Notes (Signed)
GYNECOLOGY  VISIT   HPI: 76 y.o.   Divorced White or Caucasian Not Hispanic or Latino  female   [id number] with No LMP recorded. Patient is postmenopausal.   here for dysuria, mostly at end of urination w/ "bladder spasms." Also c/o vaginal itching. Reports sxs seem to have gotten better but would still like testing done. She reports she wonders if she may have overly washed.   She saw her primary last week, c/o UTI symptoms, ua was negative. She is voiding frequently, normal amounts. Some urgency to void. Has bladder spasms at the end of voiding, every time she voids. Symptoms started 1.5 weeks ago. She did a treatment of replens last week, which seemed to help.  Having slight vulvar itching  Not sexually active for years. H/O HSV, thinks she had an outbreak last week.   GYNECOLOGIC HISTORY: No LMP recorded. Patient is postmenopausal. Contraception: PM Menopausal hormone therapy: none        OB History     Gravida  0   Para  0   Term  0   Preterm  0   AB  0   Living  0      SAB  0   IAB  0   Ectopic  0   Multiple  0   Live Births  0              Patient Active Problem List   Diagnosis Date Noted   Dysuria 10/26/2021   Arthritis of carpometacarpal (CMC) joint of left thumb 10/18/2021   Rash 08/18/2021   Acute cough 03/29/2021   Light headedness 03/29/2021   Sore throat 03/29/2021   Pounding heartbeat 03/29/2021   Dermatitis 12/09/2020   Low back pain 10/18/2020   COVID-19 08/31/2020   Patellofemoral arthritis of right knee 08/25/2020   Left wrist pain 04/19/2020   Left leg pain 04/18/2020   Acute pain of right knee 03/09/2020   Sensorineural hearing loss (SNHL) of both ears 03/20/2019   Deviated nasal septum 03/02/2019   Pulmonary nodule 07/29/2018   Dupuytren's contracture of both hands 07/11/2018   Ganglion cyst of volar aspect of left wrist 06/04/2018   Frozen shoulder 03/18/2018   Right rotator cuff tear 10/15/2017   Tinnitus of both ears  01/01/2017   Reaction to internal stress 05/29/2016   Prediabetes 08/10/2015   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[place]) 05/20/2015   Open-angle glaucoma 06/17/2014   Vitamin D deficiency 10/18/2011   HSV infection    Atrophic vaginitis    VARICOSE VEINS, LOWER EXTREMITIES 09/28/2009   GERD 07/14/2009   Hyperlipidemia 12/26/2006   GILBERT'S SYNDROME 08/09/2006   DEGENERATIVE [place] DISEASE 08/09/2006    Past Medical History:  Diagnosis Date   Anxiety    Atrophic vaginitis    Breast cancer ([place])    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/20/2015   Breast cyst    x3   DDD (degenerative disc disease)    Diverticulosis    Fasting hyperglycemia    GERD (gastroesophageal reflux disease)    [name] syndrome    Glaucoma    History of hiatal hernia    HSV infection    Hyperlipidemia    Radiation    left breast 50.4 gray   STD (sexually transmitted disease)    HSV    Past Surgical History:  Procedure Laterality Date   APPENDECTOMY  1961   BREAST CYST ASPIRATION  x2 right ,x1 left   COLONOSCOPY  2007   neg   DILATION AND CURETTAGE OF UTERUS  1975   FLEXIBLE SIGMOIDOSCOPY     x2   PELVIC LAPAROSCOPY     RADIOACTIVE SEED GUIDED PARTIAL MASTECTOMY WITH AXILLARY SENTINEL LYMPH NODE BIOPSY Left 05/31/2015   Procedure: LEFT BREAST RADIOACTIVE SEED GUIDED LUMPECTOMY WITH LEFT AXILLARY SENTINEL LYMPH NODE BIOPSY;  Surgeon: [name], MD;  Location: [facility];  Service: General;  Laterality: Left;   RE-EXCISION OF BREAST LUMPECTOMY Left 07/01/2015   Procedure: RE-EXCISION OF LEFT BREAST INFERIOR MARGIN;  Surgeon: [name], MD;  Location: [facility];  Service: General;  Laterality: Left;   UPPER GASTROINTESTINAL ENDOSCOPY     UPPER GI ENDOSCOPY  10/2010   Dr [name] TOOTH EXTRACTION  1978    Current Outpatient Medications  Medication Sig Dispense Refill   aspirin EC 81 MG  tablet Take 1 tablet (81 mg total) by mouth daily.     bimatoprost (LUMIGAN) 0.01 % SOLN Place 1 drop into both eyes at bedtime.     brinzolamide (AZOPT) 1 % ophthalmic suspension 1 drop 2 (two) times daily.     calcium carbonate (TUMS - DOSED IN MG ELEMENTAL CALCIUM) 500 MG chewable tablet Chew 1 tablet by mouth as needed for indigestion or heartburn (1000 mg).      Carboxymethylcellulose Sodium (REFRESH LIQUIGEL OP) Apply 1 drop to eye 4 (four) times daily as needed.     Cholecalciferol (VITAMIN D3 PO) Take 5,000 Int'l Units by mouth daily.      clotrimazole-betamethasone (LOTRISONE) cream Apply 1 application. topically daily. 30 g 0   ezetimibe (ZETIA) 10 MG tablet Take 1 tablet by mouth once daily 90 tablet 2   famotidine (PEPCID) 10 MG tablet Take 1 tablet (10 mg total) by mouth daily. (Patient taking differently: Take 10 mg by mouth. Daily (may take twice daily if needed)) 90 tablet 3   ibuprofen (ADVIL) 200 MG tablet Take 200 mg by mouth every 6 (six) hours as needed.     letrozole (FEMARA) 2.5 MG tablet Take 1 tablet by mouth once daily 90 tablet 3   meloxicam (MOBIC) 7.5 MG tablet Take 1 tablet (7.5 mg total) by mouth daily. 10 tablet 0   rosuvastatin (CRESTOR) 40 MG tablet Take 1 tablet (40 mg total) by mouth daily. 90 tablet 3   sodium chloride (MURO 128) 5 % ophthalmic solution Place 1 drop into both eyes 2 (two) times a day. Before Azopt     Vaginal Lubricant (REPLENS) GEL Place 1 application vaginally daily.     No current facility-administered medications for this visit.     ALLERGIES: Arimidex [anastrozole], Latex, Septra ds [sulfamethoxazole-trimethoprim], Sulfa antibiotics, Adhesive [tape], and Augmentin [amoxicillin-pot clavulanate]  Family History  Problem Relation Age of Onset   Lung cancer Mother        smoker   Endometriosis Mother    Cancer Mother        Bladder cancer   Stomach cancer Maternal Grandmother    Coronary artery disease Father    Cancer Father 76        intestinal carcinoid tumors of the ileum   Sudden death Sister        20 week old, congenital deformity   Liver cancer Maternal Uncle    Stroke Paternal Grandfather 75   Leukemia Maternal Uncle    Breast cancer Paternal Aunt        great  paternal aunt-Age 18's   Colon cancer Paternal Aunt        lived to 17; pat great aunt   Vascular Disease Paternal Grandmother        thoracic aneurysm   Esophageal cancer Neg Hx    Rectal cancer Neg Hx    Pancreatic cancer Neg Hx     Social History   Socioeconomic History   Marital status: Divorced    Spouse name: Not on file   Number of children: 0   Years of education: Not on file   Highest education level: Not on file  Occupational History   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  Tobacco Use   Smoking status: Former    Types: Cigarettes    Quit date: 03/28/1999    Years since quitting: 22.6   Smokeless tobacco: Never   Tobacco comments:    smoked 1967-2000, up to 3/4 ppd  Vaping Use   Vaping Use: Never used  Substance and Sexual Activity   Alcohol use: Yes    Alcohol/week: 10.0 standard drinks of alcohol    Types: 10 Cans of beer per week   Drug use: No   Sexual activity: Not Currently    Birth control/protection: Post-menopausal    Comment: 1st intercourse 76 yo-More than 5 partners  Other Topics Concern   Not on file  Social History Narrative   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      Regular exercise-no   Social Determinants of Health   Financial Resource Strain: Low Risk  (04/24/2021)   Overall Financial Resource Strain (CARDIA)    Difficulty of Paying Living Expenses: Not hard at all  Food Insecurity: No Food Insecurity (04/24/2021)   Hunger Vital Sign    Worried About Running Out of Food in the Last Year: Never true    Ran Out of Food in the Last Year: Never true  Transportation Needs: No Transportation Needs (04/24/2021)   PRAPARE - Hydrologist  (Medical): No    Lack of Transportation (Non-Medical): No  Physical Activity: Insufficiently Active (04/24/2021)   Exercise Vital Sign    Days of Exercise per Week: 3 days    Minutes of Exercise per Session: 20 min  Stress: No Stress Concern Present (04/24/2021)   [place]    Feeling of Stress : Not at all  Social Connections: Moderately Integrated (04/24/2021)   Social Connection and Isolation Panel [NHANES]    Frequency of Communication with Friends and Family: Twice a week    Frequency of Social Gatherings with Friends and Family: Twice a week    Attends Religious Services: More than 4 times per year    Active Member of [employer] or Organizations: Yes    Attends Archivist Meetings: More than 4 times per year    Marital Status: Divorced  Intimate Partner Violence: Not At Risk (04/24/2021)   Humiliation, Afraid, Rape, and Kick questionnaire    Fear of Current or Ex-Partner: No    Emotionally Abused: No    Physically Abused: No    Sexually Abused: No    Review of Systems  Genitourinary:  Positive for frequency.  All other systems reviewed and are negative.   PHYSICAL EXAMINATION:    There were no vitals taken for this visit.    General appearance: alert, cooperative and appears stated age CVA: not tender Abdomen: soft, non-tender; non distended,  no masses,  no organomegaly  Pelvic: External genitalia:  no lesions              Urethra:  normal appearing urethra with no masses, tenderness or lesions              Bartholins and Skenes: normal                 Vagina: atrophic appearing vagina, very narrow in the upper 2 cm of the vagina, no abnormal d/c seen              Cervix:  not seen              Bimanual Exam:  no masses or tenderness  Chaperone was present for exam.  1. Dysuria - Urinalysis, Complete - Urine Culture  2. Subacute vulvitis - WET PREP FOR TRICH, YEAST, CLUE  3. Yeast  vaginitis - fluconazole (DIFLUCAN) 150 MG tablet; Take 1 tablet (150 mg total) by mouth once for 1 dose. Take one tablet.  Repeat in 72 hours if symptoms are not completely resolved.  Dispense: 2 tablet; Refill: 0  4. Cystitis - nitrofurantoin, macrocrystal-monohydrate, (MACROBID) 100 MG capsule; Take 1 capsule (100 mg total) by mouth 2 (two) times daily.  Dispense: 10 capsule; Refill: 0 - phenazopyridine (PYRIDIUM) 200 MG tablet; Take 1 tablet (200 mg total) by mouth 3 (three) times daily with meals.  Dispense: 6 tablet; Refill: 0

## 2021-11-03 ENCOUNTER — Encounter: Payer: Self-pay | Admitting: Obstetrics and Gynecology

## 2021-11-03 ENCOUNTER — Ambulatory Visit (INDEPENDENT_AMBULATORY_CARE_PROVIDER_SITE_OTHER): Payer: Medicare Other | Admitting: Obstetrics and Gynecology

## 2021-11-03 VITALS — BP 118/78 | HR 83

## 2021-11-03 DIAGNOSIS — B3731 Acute candidiasis of vulva and vagina: Secondary | ICD-10-CM | POA: Diagnosis not present

## 2021-11-03 DIAGNOSIS — R300 Dysuria: Secondary | ICD-10-CM | POA: Diagnosis not present

## 2021-11-03 DIAGNOSIS — N3090 Cystitis, unspecified without hematuria: Secondary | ICD-10-CM | POA: Diagnosis not present

## 2021-11-03 DIAGNOSIS — N763 Subacute and chronic vulvitis: Secondary | ICD-10-CM | POA: Diagnosis not present

## 2021-11-03 LAB — URINALYSIS, COMPLETE
Bilirubin Urine: NEGATIVE
Glucose, UA: NEGATIVE
Hyaline Cast: NONE SEEN /LPF
Nitrite: NEGATIVE
Specific Gravity, Urine: 1.025 (ref 1.001–1.035)
pH: 5 (ref 5.0–8.0)

## 2021-11-03 LAB — WET PREP FOR TRICH, YEAST, CLUE

## 2021-11-03 MED ORDER — NITROFURANTOIN MONOHYD MACRO 100 MG PO CAPS: 100.0000 mg | ORAL_CAPSULE | Freq: Two times a day (BID) | ORAL | 0 refills | Status: DC

## 2021-11-03 MED ORDER — FLUCONAZOLE 150 MG PO TABS: 150.0000 mg | ORAL_TABLET | Freq: Once | ORAL | 0 refills | Status: AC

## 2021-11-03 MED ORDER — PHENAZOPYRIDINE HCL 200 MG PO TABS: 200.0000 mg | ORAL_TABLET | Freq: Three times a day (TID) | ORAL | 0 refills | Status: DC

## 2021-11-03 NOTE — Patient Instructions (Addendum)
Vaginal Yeast Infection, Adult  Vaginal yeast infection is a condition that causes vaginal discharge as well as soreness, swelling, and redness (inflammation) of the vagina. This is a common condition. Some women get this infection frequently. What are the causes? This condition is caused by a change in the normal balance of the yeast (Candida) and normal bacteria that live in the vagina. This change causes an overgrowth of yeast, which causes the inflammation. What increases the risk? The condition is more likely to develop in women who: Take antibiotic medicines. Have diabetes. Take birth control pills. Are pregnant. Douche often. Have a weak body defense system (immune system). Have been taking steroid medicines for a long time. Frequently wear tight clothing. What are the signs or symptoms? Symptoms of this condition include: White, thick, creamy vaginal discharge. Swelling, itching, redness, and irritation of the vagina. The lips of the vagina (labia) may be affected as well. Pain or a burning feeling while urinating. Pain during sex. How is this diagnosed? This condition is diagnosed based on: Your medical history. A physical exam. A pelvic exam. Your health care provider will examine a sample of your vaginal discharge under a microscope. Your health care provider may send this sample for testing to confirm the diagnosis. How is this treated? This condition is treated with medicine. Medicines may be over-the-counter or prescription. You may be told to use one or more of the following: Medicine that is taken by mouth (orally). Medicine that is applied as a cream (topically). Medicine that is inserted directly into the vagina (suppository). Follow these instructions at home: Take or apply over-the-counter and prescription medicines only as told by your health care provider. Do not use tampons until your health care provider approves. Do not have sex until your infection has  cleared. Sex can prolong or worsen your symptoms of infection. Ask your health care provider when it is safe to resume sexual activity. Keep all follow-up visits. This is important. How is this prevented?  Do not wear tight clothes, such as pantyhose or tight pants. Wear breathable cotton underwear. Do not use douches, perfumed soap, creams, or powders. Wipe from front to back after using the toilet. If you have diabetes, keep your blood sugar levels under control. Ask your health care provider for other ways to prevent yeast infections. Contact a health care provider if: You have a fever. Your symptoms go away and then return. Your symptoms do not get better with treatment. Your symptoms get worse. You have new symptoms. You develop blisters in or around your vagina. You have blood coming from your vagina and it is not your menstrual period. You develop pain in your abdomen. Summary Vaginal yeast infection is a condition that causes discharge as well as soreness, swelling, and redness (inflammation) of the vagina. This condition is treated with medicine. Medicines may be over-the-counter or prescription. Take or apply over-the-counter and prescription medicines only as told by your health care provider. Do not douche. Resume sexual activity or use of tampons as instructed by your health care provider. Contact a health care provider if your symptoms do not get better with treatment or your symptoms go away and then return. This information is not intended to replace advice given to you by your health care provider. Make sure you discuss any questions you have with your health care provider. Document Revised: 07/11/2020 Document Reviewed: 07/11/2020 Elsevier Patient Education  2023 Elsevier Inc. Urinary Tract Infection, Adult  A urinary tract infection (UTI) is an   infection of any part of the urinary tract. The urinary tract includes the kidneys, ureters, bladder, and urethra. These  organs make, store, and get rid of urine in the body. An upper UTI affects the ureters and kidneys. A lower UTI affects the bladder and urethra. What are the causes? Most urinary tract infections are caused by bacteria in your genital area around your urethra, where urine leaves your body. These bacteria grow and cause inflammation of your urinary tract. What increases the risk? You are more likely to develop this condition if: You have a urinary catheter that stays in place. You are not able to control when you urinate or have a bowel movement (incontinence). You are female and you: Use a spermicide or diaphragm for birth control. Have low estrogen levels. Are pregnant. You have certain genes that increase your risk. You are sexually active. You take antibiotic medicines. You have a condition that causes your flow of urine to slow down, such as: An enlarged prostate, if you are female. Blockage in your urethra. A kidney stone. A nerve condition that affects your bladder control (neurogenic bladder). Not getting enough to drink, or not urinating often. You have certain medical conditions, such as: Diabetes. A weak disease-fighting system (immunesystem). Sickle cell disease. Gout. Spinal cord injury. What are the signs or symptoms? Symptoms of this condition include: Needing to urinate right away (urgency). Frequent urination. This may include small amounts of urine each time you urinate. Pain or burning with urination. Blood in the urine. Urine that smells bad or unusual. Trouble urinating. Cloudy urine. Vaginal discharge, if you are female. Pain in the abdomen or the lower back. You may also have: Vomiting or a decreased appetite. Confusion. Irritability or tiredness. A fever or chills. Diarrhea. The first symptom in older adults may be confusion. In some cases, they may not have any symptoms until the infection has worsened. How is this diagnosed? This condition is  diagnosed based on your medical history and a physical exam. You may also have other tests, including: Urine tests. Blood tests. Tests for STIs (sexually transmitted infections). If you have had more than one UTI, a cystoscopy or imaging studies may be done to determine the cause of the infections. How is this treated? Treatment for this condition includes: Antibiotic medicine. Over-the-counter medicines to treat discomfort. Drinking enough water to stay hydrated. If you have frequent infections or have other conditions such as a kidney stone, you may need to see a health care provider who specializes in the urinary tract (urologist). In rare cases, urinary tract infections can cause sepsis. Sepsis is a life-threatening condition that occurs when the body responds to an infection. Sepsis is treated in the hospital with IV antibiotics, fluids, and other medicines. Follow these instructions at home:  Medicines Take over-the-counter and prescription medicines only as told by your health care provider. If you were prescribed an antibiotic medicine, take it as told by your health care provider. Do not stop using the antibiotic even if you start to feel better. General instructions Make sure you: Empty your bladder often and completely. Do not hold urine for long periods of time. Empty your bladder after sex. Wipe from front to back after urinating or having a bowel movement if you are female. Use each tissue only one time when you wipe. Drink enough fluid to keep your urine pale yellow. Keep all follow-up visits. This is important. Contact a health care provider if: Your symptoms do not get better after 1-2   days. Your symptoms go away and then return. Get help right away if: You have severe pain in your back or your lower abdomen. You have a fever or chills. You have nausea or vomiting. Summary A urinary tract infection (UTI) is an infection of any part of the urinary tract, which includes  the kidneys, ureters, bladder, and urethra. Most urinary tract infections are caused by bacteria in your genital area. Treatment for this condition often includes antibiotic medicines. If you were prescribed an antibiotic medicine, take it as told by your health care provider. Do not stop using the antibiotic even if you start to feel better. Keep all follow-up visits. This is important. This information is not intended to replace advice given to you by your health care provider. Make sure you discuss any questions you have with your health care provider. Document Revised: 12/04/2019 Document Reviewed: 12/04/2019 Elsevier Patient Education  2023 Elsevier Inc.  

## 2021-11-05 LAB — URINE CULTURE
MICRO NUMBER:: 13594223
SPECIMEN QUALITY:: ADEQUATE

## 2021-11-06 ENCOUNTER — Telehealth: Payer: Self-pay

## 2021-11-06 MED ORDER — CIPROFLOXACIN HCL 500 MG PO TABS: 500.0000 mg | ORAL_TABLET | Freq: Two times a day (BID) | ORAL | 0 refills | Status: DC

## 2021-11-06 NOTE — Telephone Encounter (Signed)
Encounter not needed

## 2021-11-06 NOTE — Telephone Encounter (Signed)
-----   Message from Lindsey Dom, MD sent at 11/06/2021 10:37 AM EDT ----- Please let the patient know that she does have a UTI and it isn't very sensitive to the antibiotic she was given. Given her sensitivity to Augmentin and allergy to sulfa, will treat with cipro. Please call in ciprofloxacin 500 mg po q 12 hours x 5 days. She should stop the macrobid

## 2021-11-07 ENCOUNTER — Encounter: Payer: Self-pay | Admitting: Obstetrics and Gynecology

## 2021-11-09 ENCOUNTER — Encounter: Payer: Self-pay | Admitting: Internal Medicine

## 2021-11-13 ENCOUNTER — Other Ambulatory Visit: Payer: Self-pay | Admitting: *Deleted

## 2021-11-13 MED ORDER — LETROZOLE 2.5 MG PO TABS
2.5000 mg | ORAL_TABLET | Freq: Every day | ORAL | 3 refills | Status: DC
  Filled 2022-01-26: qty 90, 90d supply, fill #0
  Filled 2022-03-15: qty 90, 90d supply, fill #1
  Filled 2022-08-09 – 2022-08-22 (×2): qty 90, 90d supply, fill #2

## 2021-12-08 DIAGNOSIS — H53453 Other localized visual field defect, bilateral: Secondary | ICD-10-CM | POA: Diagnosis not present

## 2021-12-08 DIAGNOSIS — H40053 Ocular hypertension, bilateral: Secondary | ICD-10-CM | POA: Diagnosis not present

## 2021-12-08 DIAGNOSIS — H2513 Age-related nuclear cataract, bilateral: Secondary | ICD-10-CM | POA: Diagnosis not present

## 2021-12-25 ENCOUNTER — Other Ambulatory Visit (HOSPITAL_BASED_OUTPATIENT_CLINIC_OR_DEPARTMENT_OTHER): Payer: Self-pay

## 2021-12-26 ENCOUNTER — Encounter: Payer: Self-pay | Admitting: Internal Medicine

## 2021-12-26 ENCOUNTER — Other Ambulatory Visit (HOSPITAL_BASED_OUTPATIENT_CLINIC_OR_DEPARTMENT_OTHER): Payer: Self-pay

## 2021-12-26 MED ORDER — TETANUS-DIPHTH-ACELL PERTUSSIS 5-2.5-18.5 LF-MCG/0.5 IM SUSY
PREFILLED_SYRINGE | INTRAMUSCULAR | 0 refills | Status: DC
  Filled 2021-12-26: qty 0.5, 1d supply, fill #0

## 2021-12-27 ENCOUNTER — Other Ambulatory Visit (HOSPITAL_BASED_OUTPATIENT_CLINIC_OR_DEPARTMENT_OTHER): Payer: Self-pay

## 2021-12-28 ENCOUNTER — Other Ambulatory Visit (HOSPITAL_BASED_OUTPATIENT_CLINIC_OR_DEPARTMENT_OTHER): Payer: Self-pay

## 2021-12-29 ENCOUNTER — Other Ambulatory Visit (HOSPITAL_BASED_OUTPATIENT_CLINIC_OR_DEPARTMENT_OTHER): Payer: Self-pay

## 2022-01-11 NOTE — Progress Notes (Unsigned)
76 y.o. [place] Divorced White or Caucasian Not Hispanic or Latino female here for annual exam. No vaginal bleeding.  She would like to have the area where a cyst was removed looked at, just to be safe, no symptoms. .  She is leaving the country and is worried about getting a UTI while she is out the country.    H/O left breast cancer, on letrozol until next July (will be 7 years)  She uses replens on occasional for vaginal dryness. Not sexually active.    H/O HSV, gets an outbreak with stress. Doesn't want medication.   No LMP recorded. Patient is postmenopausal.          Sexually active: No.  The current method of family planning is post menopausal status.    Exercising: Yes.     Walking  Smoker:  no  Health Maintenance: Pap:  12/12/17 WNL, 01/13/15 WNL  History of abnormal Pap:  no MMG:  06/26/21 Bi-rads 2 benign  BMD:   03/02/20 normal  Colonoscopy: 02/13/16 polyp f/u 7 years  TDaP:  12/26/21 Gardasil: n/a   reports that she quit smoking about 22 years ago. Her smoking use included cigarettes. She has never used smokeless tobacco. She reports current alcohol use of about 10.0 standard drinks of alcohol per week. She reports that she does not use drugs.  Past Medical History:  Diagnosis Date   Anxiety    Atrophic vaginitis    Breast cancer ([place])    Breast cancer of upper-inner quadrant of left female breast ([place]) 05/20/2015   Breast cyst    x3   DDD (degenerative disc disease)    Diverticulosis    Fasting hyperglycemia    GERD (gastroesophageal reflux disease)    [name] syndrome    Glaucoma    History of hiatal hernia    HSV infection    Hyperlipidemia    Radiation    left breast 50.4 gray   STD (sexually transmitted disease)    HSV    Past Surgical History:  Procedure Laterality Date   APPENDECTOMY  1961   BREAST CYST ASPIRATION     x2 right ,x1 left   COLONOSCOPY  2007   neg   DILATION AND CURETTAGE OF UTERUS  1975   FLEXIBLE SIGMOIDOSCOPY     x2   PELVIC  LAPAROSCOPY     RADIOACTIVE SEED GUIDED PARTIAL MASTECTOMY WITH AXILLARY SENTINEL LYMPH NODE BIOPSY Left 05/31/2015   Procedure: LEFT BREAST RADIOACTIVE SEED GUIDED LUMPECTOMY WITH LEFT AXILLARY SENTINEL LYMPH NODE BIOPSY;  Surgeon: [name], MD;  Location: [place];  Service: General;  Laterality: Left;   RE-EXCISION OF BREAST LUMPECTOMY Left 07/01/2015   Procedure: RE-EXCISION OF LEFT BREAST INFERIOR MARGIN;  Surgeon: [name], MD;  Location: [place];  Service: General;  Laterality: Left;   UPPER GASTROINTESTINAL ENDOSCOPY     UPPER GI ENDOSCOPY  10/2010   Dr [name] TOOTH EXTRACTION  1978    Current Outpatient Medications  Medication Sig Dispense Refill   aspirin EC 81 MG tablet Take 1 tablet (81 mg total) by mouth daily.     bimatoprost (LUMIGAN) 0.01 % SOLN Place 1 drop into both eyes at bedtime.     brinzolamide (AZOPT) 1 % ophthalmic suspension 1 drop 2 (two) times daily.     calcium carbonate (TUMS - DOSED IN MG ELEMENTAL CALCIUM) 500 MG chewable tablet Chew 1 tablet by mouth as needed for indigestion or heartburn (1000 mg).  Carboxymethylcellulose Sodium (REFRESH LIQUIGEL OP) Apply 1 drop to eye 4 (four) times daily as needed.     Cholecalciferol (VITAMIN D3 PO) Take 5,000 Int'l Units by mouth daily.      ezetimibe (ZETIA) 10 MG tablet Take 1 tablet by mouth once daily 90 tablet 2   famotidine (PEPCID) 10 MG tablet Take 1 tablet (10 mg total) by mouth daily. (Patient taking differently: Take 10 mg by mouth. Daily (may take twice daily if needed)) 90 tablet 3   ibuprofen (ADVIL) 200 MG tablet Take 200 mg by mouth every 6 (six) hours as needed.     letrozole (FEMARA) 2.5 MG tablet Take 1 tablet (2.5 mg total) by mouth daily. 90 tablet 3   rosuvastatin (CRESTOR) 40 MG tablet Take 1 tablet (40 mg total) by mouth daily. 90 tablet 3   sodium chloride (MURO 128) 5 % ophthalmic solution Place 1 drop into both eyes 2 (two) times a  day. Before Azopt     Tdap (BOOSTRIX) 5-2.5-18.5 LF-MCG/0.5 injection Inject into the muscle. 0.5 mL 0   Vaginal Lubricant (REPLENS) GEL Place 1 application vaginally daily.     clotrimazole-betamethasone (LOTRISONE) cream Apply 1 application. topically daily. (Patient not taking: Reported on 01/17/2022) 30 g 0   meloxicam (MOBIC) 7.5 MG tablet Take 1 tablet (7.5 mg total) by mouth daily. (Patient not taking: Reported on 01/17/2022) 10 tablet 0   No current facility-administered medications for this visit.    Family History  Problem Relation Age of Onset   Lung cancer Mother        smoker   Endometriosis Mother    Cancer Mother        Bladder cancer   Stomach cancer Maternal Grandmother    Coronary artery disease Father    Cancer Father 56       intestinal carcinoid tumors of the ileum   Sudden death Sister        11 week old, congenital deformity   Liver cancer Maternal Uncle    Stroke Paternal Grandfather 67   Leukemia Maternal Uncle    Breast cancer Paternal Aunt        great paternal aunt-Age 17's   Colon cancer Paternal Aunt        lived to 3; pat great aunt   Vascular Disease Paternal Grandmother        thoracic aneurysm   Esophageal cancer Neg Hx    Rectal cancer Neg Hx    Pancreatic cancer Neg Hx     Review of Systems  All other systems reviewed and are negative.   Exam:   BP 136/70   Pulse 66   Ht 5' 4.5" (1.638 m)   Wt 169 lb (76.7 kg)   SpO2 99%   BMI 28.56 kg/m   Weight change: '@WEIGHTCHANGE'$ @ Height:   Height: 5' 4.5" (163.8 cm)  Ht Readings from Last 3 Encounters:  01/17/22 5' 4.5" (1.638 m)  10/18/21 '5\' 4"'$  (1.626 m)  10/05/21 '5\' 4"'$  (1.626 m)    General appearance: alert, cooperative and appears stated age Head: Normocephalic, without obvious abnormality, atraumatic Neck: no adenopathy, supple, symmetrical, trachea midline and thyroid normal to inspection and palpation Breasts: normal appearance, no masses or tenderness Abdomen: soft, non-tender;  non distended,  no masses,  no organomegaly Extremities: extremities normal, atraumatic, no cyanosis or edema Skin: Skin color, texture, turgor normal. No rashes or lesions Lymph nodes: Cervical, supraclavicular, and axillary nodes normal. No abnormal inguinal nodes palpated Neurologic: Grossly normal  Pelvic: External genitalia:  no lesions              Urethra:  normal appearing urethra with no masses, tenderness or lesions              Bartholins and Skenes: normal                 Vagina: atrophic appearing vagina, very narrow in the upper 1-2 cm of the vagina. Unable to open the adolescent speculum at the apex more than a few mm.               Cervix:  not seen.                Bimanual Exam:  Uterus:   no masses or tenderness              Adnexa: no mass, fullness, tenderness               Rectovaginal: Confirms               Anus:  normal sphincter tone, no lesions  Gae Dry chaperoned for the exam.  1. Encounter for breast and pelvic examination Discussed breast self exam Discussed calcium and vit D intake Mammogram and colonoscopy UTD Labs with primary No pap needed  2. History of UTI Will be out of the country for 2-3 weeks, requests scrips. - nitrofurantoin, macrocrystal-monohydrate, (MACROBID) 100 MG capsule; Take 1 capsule (100 mg total) by mouth 2 (two) times daily.  Dispense: 10 capsule; Refill: 0  3. Herpes simplex infection of genitourinary system - valACYclovir (VALTREX) 500 MG tablet; Take one tablet po BID x 3 days  Dispense: 30 tablet; Refill: 2  4. History of breast cancer On letrozol, f/u with Oncology

## 2022-01-17 ENCOUNTER — Encounter: Payer: Self-pay | Admitting: Obstetrics and Gynecology

## 2022-01-17 ENCOUNTER — Ambulatory Visit (INDEPENDENT_AMBULATORY_CARE_PROVIDER_SITE_OTHER): Payer: Medicare Other | Admitting: Obstetrics and Gynecology

## 2022-01-17 VITALS — BP 136/70 | HR 66 | Ht 64.5 in | Wt 169.0 lb

## 2022-01-17 DIAGNOSIS — Z9189 Other specified personal risk factors, not elsewhere classified: Secondary | ICD-10-CM | POA: Diagnosis not present

## 2022-01-17 DIAGNOSIS — Z87440 Personal history of urinary (tract) infections: Secondary | ICD-10-CM | POA: Diagnosis not present

## 2022-01-17 DIAGNOSIS — A6000 Herpesviral infection of urogenital system, unspecified: Secondary | ICD-10-CM | POA: Diagnosis not present

## 2022-01-17 DIAGNOSIS — Z853 Personal history of malignant neoplasm of breast: Secondary | ICD-10-CM | POA: Diagnosis not present

## 2022-01-17 DIAGNOSIS — Z01419 Encounter for gynecological examination (general) (routine) without abnormal findings: Secondary | ICD-10-CM

## 2022-01-17 DIAGNOSIS — Z79811 Long term (current) use of aromatase inhibitors: Secondary | ICD-10-CM

## 2022-01-17 MED ORDER — NITROFURANTOIN MONOHYD MACRO 100 MG PO CAPS: 100.0000 mg | ORAL_CAPSULE | Freq: Two times a day (BID) | ORAL | 0 refills | Status: DC

## 2022-01-17 MED ORDER — VALACYCLOVIR HCL 500 MG PO TABS: ORAL_TABLET | ORAL | 2 refills | Status: DC

## 2022-01-17 NOTE — Progress Notes (Signed)
[place] Phone: [phone] Subjective:   [name], am serving as a scribe for Dr. Hulan Saas.  I'm seeing this patient by the request  of:  [name], MD  CC: Left thumb, left foot and back pain follow-up  FOY:DXAJOINOMV  10/18/2021 [facility] arthritis with hypoechoic changes on ultrasound that is consistent with an effusion.  Patient declined any type of injection.  Discussed bracing at night and considering a thumb spica splint discussed icing regimen and topical anti-inflammatories.  Worsening pain patient will call us sooner.  Patient will follow-up with [place] again in 6 to 8 weeks otherwise.  Updated 01/18/2022 EVANGELINA Barrett is a 76 y.o. female coming in with complaint of left thumb and L foot pain. Patient states that her foot pain has improved. Thumb pain is intermittent. Having a good week and less pain in [facility] joint.       Past Medical History:  Diagnosis Date   Anxiety    Atrophic vaginitis    Breast cancer ([place])    Breast cancer of upper-inner quadrant of left female breast ([place]) 05/20/2015   Breast cyst    x3   DDD (degenerative disc disease)    Diverticulosis    Fasting hyperglycemia    GERD (gastroesophageal reflux disease)    [name] syndrome    Glaucoma    History of hiatal hernia    HSV infection    Hyperlipidemia    Radiation    left breast 50.4 gray   STD (sexually transmitted disease)    HSV   Past Surgical History:  Procedure Laterality Date   APPENDECTOMY  1961   BREAST CYST ASPIRATION     x2 right ,x1 left   COLONOSCOPY  2007   neg   DILATION AND CURETTAGE OF UTERUS  1975   FLEXIBLE SIGMOIDOSCOPY     x2   PELVIC LAPAROSCOPY     RADIOACTIVE SEED GUIDED PARTIAL MASTECTOMY WITH AXILLARY SENTINEL LYMPH NODE BIOPSY Left 05/31/2015   Procedure: LEFT BREAST RADIOACTIVE SEED GUIDED LUMPECTOMY WITH LEFT AXILLARY SENTINEL LYMPH NODE BIOPSY;  Surgeon: [name],  MD;  Location: [place];  Service: General;  Laterality: Left;   RE-EXCISION OF BREAST LUMPECTOMY Left 07/01/2015   Procedure: RE-EXCISION OF LEFT BREAST INFERIOR MARGIN;  Surgeon: [name], MD;  Location: [place];  Service: General;  Laterality: Left;   UPPER GASTROINTESTINAL ENDOSCOPY     UPPER GI ENDOSCOPY  10/2010   Dr [name] TOOTH EXTRACTION  1978   Social History   Socioeconomic History   Marital status: Divorced    Spouse name: Not on file   Number of children: 0   Years of education: Not on file   Highest education level: Not on file  Occupational History   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  Tobacco Use   Smoking status: Former    Types: Cigarettes    Quit date: 03/28/1999    Years since quitting: 22.8   Smokeless tobacco: Never   Tobacco comments:    smoked 1967-2000, up to 3/4 ppd  Vaping Use   Vaping Use: Never used  Substance and Sexual Activity   Alcohol use: Yes    Alcohol/week: 10.0 standard drinks of alcohol    Types: 10 Cans of beer per week   Drug use: No   Sexual activity: Not Currently    Birth control/protection: Post-menopausal  Comment: 1st intercourse 76 yo-More than 5 partners  Other Topics Concern   Not on file  Social History Narrative   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      Regular exercise-no   Social Determinants of Health   Financial Resource Strain: Low Risk  (04/24/2021)   Overall Financial Resource Strain (CARDIA)    Difficulty of Paying Living Expenses: Not hard at all  Food Insecurity: No Food Insecurity (04/24/2021)   Hunger Vital Sign    Worried About Running Out of Food in the Last Year: Never true    Ran Out of Food in the Last Year: Never true  Transportation Needs: No Transportation Needs (04/24/2021)   PRAPARE - Hydrologist (Medical): No    Lack of Transportation (Non-Medical): No  Physical  Activity: Insufficiently Active (04/24/2021)   Exercise Vital Sign    Days of Exercise per Week: 3 days    Minutes of Exercise per Session: 20 min  Stress: No Stress Concern Present (04/24/2021)   [place]    Feeling of Stress : Not at all  Social Connections: Moderately Integrated (04/24/2021)   Social Connection and Isolation Panel [NHANES]    Frequency of Communication with Friends and Family: Twice a week    Frequency of Social Gatherings with Friends and Family: Twice a week    Attends Religious Services: More than 4 times per year    Active Member of [employer] or Organizations: Yes    Attends Music therapist: More than 4 times per year    Marital Status: Divorced   Allergies  Allergen Reactions   Arimidex [Anastrozole] Other (See Comments)    Unable to function, loopy, dizzy, and mentally fogginess   Latex Rash and Other (See Comments)    Gloves, Band-Aids    Septra Ds [Sulfamethoxazole-Trimethoprim] Hives   Sulfa Antibiotics Hives   Adhesive [Tape] Rash   Augmentin [Amoxicillin-Pot Clavulanate] Diarrhea   Family History  Problem Relation Age of Onset   Lung cancer Mother        smoker   Endometriosis Mother    Cancer Mother        Bladder cancer   Stomach cancer Maternal Grandmother    Coronary artery disease Father    Cancer Father 34       intestinal carcinoid tumors of the ileum   Sudden death Sister        14 week old, congenital deformity   Liver cancer Maternal Uncle    Stroke Paternal Grandfather 1   Leukemia Maternal Uncle    Breast cancer Paternal Aunt        great paternal aunt-Age 75's   Colon cancer Paternal Aunt        lived to 78; pat great aunt   Vascular Disease Paternal Grandmother        thoracic aneurysm   Esophageal cancer Neg Hx    Rectal cancer Neg Hx    Pancreatic cancer Neg Hx      Current Outpatient Medications (Cardiovascular):    ezetimibe (ZETIA) 10  MG tablet, Take 1 tablet by mouth once daily   rosuvastatin (CRESTOR) 40 MG tablet, Take 1 tablet (40 mg total) by mouth daily.   Current Outpatient Medications (Analgesics):    aspirin EC 81 MG tablet, Take 1 tablet (81 mg total) by mouth daily.   ibuprofen (ADVIL) 200 MG tablet, Take 200  mg by mouth every 6 (six) hours as needed.   meloxicam (MOBIC) 7.5 MG tablet, Take 1 tablet (7.5 mg total) by mouth daily.   Current Outpatient Medications (Other):    bimatoprost (LUMIGAN) 0.01 % SOLN, Place 1 drop into both eyes at bedtime.   brinzolamide (AZOPT) 1 % ophthalmic suspension, 1 drop 2 (two) times daily.   calcium carbonate (TUMS - DOSED IN MG ELEMENTAL CALCIUM) 500 MG chewable tablet, Chew 1 tablet by mouth as needed for indigestion or heartburn (1000 mg).    Carboxymethylcellulose Sodium (REFRESH LIQUIGEL OP), Apply 1 drop to eye 4 (four) times daily as needed.   Cholecalciferol (VITAMIN D3 PO), Take 5,000 Int'l Units by mouth daily.    clotrimazole-betamethasone (LOTRISONE) cream, Apply 1 application. topically daily.   famotidine (PEPCID) 10 MG tablet, Take 1 tablet (10 mg total) by mouth daily. (Patient taking differently: Take 10 mg by mouth. Daily (may take twice daily if needed))   letrozole (FEMARA) 2.5 MG tablet, Take 1 tablet (2.5 mg total) by mouth daily.   nitrofurantoin, macrocrystal-monohydrate, (MACROBID) 100 MG capsule, Take 1 capsule (100 mg total) by mouth 2 (two) times daily.   sodium chloride (MURO 128) 5 % ophthalmic solution, Place 1 drop into both eyes 2 (two) times a day. Before Azopt   Tdap (BOOSTRIX) 5-2.5-18.5 LF-MCG/0.5 injection, Inject into the muscle.   Vaginal Lubricant (REPLENS) GEL, Place 1 application vaginally daily.   valACYclovir (VALTREX) 500 MG tablet, Take one tablet po BID x 3 days    Objective  Blood pressure 134/76, pulse 82, height 5' 4.5" (1.638 m), weight 169 lb (76.7 kg), SpO2 98 %.   General: No apparent distress alert and oriented x3  mood and affect normal, dressed appropriately.  HEENT: Pupils equal, extraocular movements intact  Respiratory: Patient's speak in full sentences and does not appear short of breath  Cardiovascular: No lower extremity edema, non tender, no erythema  Patient does have some arthritic changes but overall is sitting very comfortably.  Deferred the rest of the exam.    Impression and Recommendations:     The above documentation has been reviewed and is accurate and complete Lyndal Pulley, DO

## 2022-01-17 NOTE — Patient Instructions (Signed)

## 2022-01-18 ENCOUNTER — Ambulatory Visit: Payer: Self-pay

## 2022-01-18 ENCOUNTER — Encounter: Payer: Self-pay | Admitting: Internal Medicine

## 2022-01-18 ENCOUNTER — Encounter: Payer: Self-pay | Admitting: Family Medicine

## 2022-01-18 ENCOUNTER — Ambulatory Visit (INDEPENDENT_AMBULATORY_CARE_PROVIDER_SITE_OTHER): Payer: Medicare Other | Admitting: Family Medicine

## 2022-01-18 VITALS — BP 134/76 | HR 82 | Ht 64.5 in | Wt 169.0 lb

## 2022-01-18 DIAGNOSIS — M79645 Pain in left finger(s): Secondary | ICD-10-CM

## 2022-01-18 DIAGNOSIS — M545 Low back pain, unspecified: Secondary | ICD-10-CM

## 2022-01-18 DIAGNOSIS — G8929 Other chronic pain: Secondary | ICD-10-CM | POA: Diagnosis not present

## 2022-01-18 DIAGNOSIS — M1812 Unilateral primary osteoarthritis of first carpometacarpal joint, left hand: Secondary | ICD-10-CM

## 2022-01-18 NOTE — Patient Instructions (Signed)
You are doing great Have appt in 4 months just in case

## 2022-01-18 NOTE — Patient Instructions (Signed)
     Medications changes include :   molnupiravir for covid if needed while traveling   Your prescription(s) have been sent to your pharmacy.

## 2022-01-18 NOTE — Assessment & Plan Note (Addendum)
Doing well overall  Discussed home exercise.  Patient is down from abdominal at this point.  No other changes in management.  Follow-up with me in 3 to 4 months

## 2022-01-18 NOTE — Progress Notes (Unsigned)
Subjective:    Patient ID: Lindsey Barrett, female    DOB: October 08, 1945, 76 y.o.   MRN: [id number]      HPI Lindsey Barrett is here for No chief complaint on file.   Discuss medication for travel - going out of the country October 5-20.  Would like to discuss which medications to take.    Taking her cholesterol medication -    Medications and allergies reviewed with patient and updated if appropriate.  Current Outpatient Medications on File Prior to Visit  Medication Sig Dispense Refill   aspirin EC 81 MG tablet Take 1 tablet (81 mg total) by mouth daily.     bimatoprost (LUMIGAN) 0.01 % SOLN Place 1 drop into both eyes at bedtime.     brinzolamide (AZOPT) 1 % ophthalmic suspension 1 drop 2 (two) times daily.     calcium carbonate (TUMS - DOSED IN MG ELEMENTAL CALCIUM) 500 MG chewable tablet Chew 1 tablet by mouth as needed for indigestion or heartburn (1000 mg).      Carboxymethylcellulose Sodium (REFRESH LIQUIGEL OP) Apply 1 drop to eye 4 (four) times daily as needed.     Cholecalciferol (VITAMIN D3 PO) Take 5,000 Int'l Units by mouth daily.      clotrimazole-betamethasone (LOTRISONE) cream Apply 1 application. topically daily. 30 g 0   ezetimibe (ZETIA) 10 MG tablet Take 1 tablet by mouth once daily 90 tablet 2   famotidine (PEPCID) 10 MG tablet Take 1 tablet (10 mg total) by mouth daily. (Patient taking differently: Take 10 mg by mouth. Daily (may take twice daily if needed)) 90 tablet 3   ibuprofen (ADVIL) 200 MG tablet Take 200 mg by mouth every 6 (six) hours as needed.     letrozole (FEMARA) 2.5 MG tablet Take 1 tablet (2.5 mg total) by mouth daily. 90 tablet 3   meloxicam (MOBIC) 7.5 MG tablet Take 1 tablet (7.5 mg total) by mouth daily. 10 tablet 0   nitrofurantoin, macrocrystal-monohydrate, (MACROBID) 100 MG capsule Take 1 capsule (100 mg total) by mouth 2 (two) times daily. 10 capsule 0   rosuvastatin (CRESTOR) 40 MG tablet Take 1 tablet (40 mg total) by mouth daily. 90  tablet 3   sodium chloride (MURO 128) 5 % ophthalmic solution Place 1 drop into both eyes 2 (two) times a day. Before Azopt     Tdap (BOOSTRIX) 5-2.5-18.5 LF-MCG/0.5 injection Inject into the muscle. 0.5 mL 0   Vaginal Lubricant (REPLENS) GEL Place 1 application vaginally daily.     valACYclovir (VALTREX) 500 MG tablet Take one tablet po BID x 3 days 30 tablet 2   [DISCONTINUED] metoprolol tartrate (LOPRESSOR) 25 MG tablet Take 1 tablet (25 mg total) by mouth as directed. 1 pill every 8 hours as needed for tachycardia or palpitations. 30 tablet 2   No current facility-administered medications on file prior to visit.    Review of Systems     Objective:  There were no vitals filed for this visit. BP Readings from Last 3 Encounters:  01/18/22 134/76  01/17/22 136/70  11/03/21 118/78   Wt Readings from Last 3 Encounters:  01/18/22 169 lb (76.7 kg)  01/17/22 169 lb (76.7 kg)  10/26/21 171 lb 2 oz (77.6 kg)   There is no height or weight on file to calculate BMI.    Physical Exam         Assessment & Plan:    See Problem List for Assessment and Plan of chronic medical problems.

## 2022-01-18 NOTE — Assessment & Plan Note (Signed)
Discussed which activities to do which ones to avoid.  We discussed potential bracing.  Worsening pain can consider injection

## 2022-01-19 ENCOUNTER — Ambulatory Visit (INDEPENDENT_AMBULATORY_CARE_PROVIDER_SITE_OTHER): Payer: Medicare Other | Admitting: Internal Medicine

## 2022-01-19 DIAGNOSIS — E782 Mixed hyperlipidemia: Secondary | ICD-10-CM | POA: Diagnosis not present

## 2022-01-19 MED ORDER — MOLNUPIRAVIR EUA 200MG CAPSULE: 4.0000 | ORAL_CAPSULE | Freq: Two times a day (BID) | ORAL | 0 refills | Status: AC

## 2022-01-19 NOTE — Assessment & Plan Note (Signed)
Chronic Continue Crestor 40 mg daily 

## 2022-01-24 ENCOUNTER — Other Ambulatory Visit (HOSPITAL_BASED_OUTPATIENT_CLINIC_OR_DEPARTMENT_OTHER): Payer: Self-pay

## 2022-01-26 ENCOUNTER — Other Ambulatory Visit (HOSPITAL_BASED_OUTPATIENT_CLINIC_OR_DEPARTMENT_OTHER): Payer: Self-pay

## 2022-01-30 ENCOUNTER — Telehealth: Payer: Self-pay

## 2022-01-30 NOTE — Telephone Encounter (Signed)
Patient called in stating she had a billing issue, states medicare mailed her a statement of benefits.  DOS: 07/04/21 for $254   Lindsey Barrett states that the codes for the service were 99490 - $129 and 99439 - $125. Patient is confused as she was not here for that date

## 2022-03-07 ENCOUNTER — Encounter: Payer: Self-pay | Admitting: Internal Medicine

## 2022-03-08 ENCOUNTER — Other Ambulatory Visit (HOSPITAL_BASED_OUTPATIENT_CLINIC_OR_DEPARTMENT_OTHER): Payer: Self-pay

## 2022-03-08 DIAGNOSIS — D2262 Melanocytic nevi of left upper limb, including shoulder: Secondary | ICD-10-CM | POA: Diagnosis not present

## 2022-03-08 DIAGNOSIS — L814 Other melanin hyperpigmentation: Secondary | ICD-10-CM | POA: Diagnosis not present

## 2022-03-08 DIAGNOSIS — L821 Other seborrheic keratosis: Secondary | ICD-10-CM | POA: Diagnosis not present

## 2022-03-08 DIAGNOSIS — Z23 Encounter for immunization: Secondary | ICD-10-CM | POA: Diagnosis not present

## 2022-03-08 DIAGNOSIS — Z86018 Personal history of other benign neoplasm: Secondary | ICD-10-CM | POA: Diagnosis not present

## 2022-03-08 DIAGNOSIS — D225 Melanocytic nevi of trunk: Secondary | ICD-10-CM | POA: Diagnosis not present

## 2022-03-08 DIAGNOSIS — D2271 Melanocytic nevi of right lower limb, including hip: Secondary | ICD-10-CM | POA: Diagnosis not present

## 2022-03-08 DIAGNOSIS — L578 Other skin changes due to chronic exposure to nonionizing radiation: Secondary | ICD-10-CM | POA: Diagnosis not present

## 2022-03-08 MED ORDER — INFLUENZA VAC A&B SA ADJ QUAD 0.5 ML IM PRSY
PREFILLED_SYRINGE | INTRAMUSCULAR | 0 refills | Status: DC
  Filled 2022-03-08: qty 0.5, 1d supply, fill #0

## 2022-03-15 ENCOUNTER — Other Ambulatory Visit (HOSPITAL_BASED_OUTPATIENT_CLINIC_OR_DEPARTMENT_OTHER): Payer: Self-pay

## 2022-03-16 ENCOUNTER — Other Ambulatory Visit (HOSPITAL_BASED_OUTPATIENT_CLINIC_OR_DEPARTMENT_OTHER): Payer: Self-pay

## 2022-03-26 ENCOUNTER — Encounter: Payer: Self-pay | Admitting: Internal Medicine

## 2022-03-26 NOTE — Progress Notes (Unsigned)
    Subjective:    Patient ID: Lindsey Barrett, female    DOB: 08-20-1945, 76 y.o.   MRN: [id number]      HPI Azelie is here for No chief complaint on file.    Left leg pain -     Medications and allergies reviewed with patient and updated if appropriate.  Current Outpatient Medications on File Prior to Visit  Medication Sig Dispense Refill   aspirin EC 81 MG tablet Take 1 tablet (81 mg total) by mouth daily.     bimatoprost (LUMIGAN) 0.01 % SOLN Place 1 drop into both eyes at bedtime.     brinzolamide (AZOPT) 1 % ophthalmic suspension 1 drop 2 (two) times daily.     calcium carbonate (TUMS - DOSED IN MG ELEMENTAL CALCIUM) 500 MG chewable tablet Chew 1 tablet by mouth as needed for indigestion or heartburn (1000 mg).      Carboxymethylcellulose Sodium (REFRESH LIQUIGEL OP) Apply 1 drop to eye 4 (four) times daily as needed.     Cholecalciferol (VITAMIN D3 PO) Take 5,000 Int'l Units by mouth daily.      clotrimazole-betamethasone (LOTRISONE) cream Apply 1 application. topically daily. 30 g 0   ezetimibe (ZETIA) 10 MG tablet Take 1 tablet by mouth once daily 90 tablet 2   famotidine (PEPCID) 10 MG tablet Take 1 tablet (10 mg total) by mouth daily. (Patient taking differently: Take 10 mg by mouth. Daily (may take twice daily if needed)) 90 tablet 3   ibuprofen (ADVIL) 200 MG tablet Take 200 mg by mouth every 6 (six) hours as needed.     influenza vaccine adjuvanted (FLUAD) 0.5 ML injection Inject into the muscle. 0.5 mL 0   letrozole (FEMARA) 2.5 MG tablet Take 1 tablet (2.5 mg total) by mouth daily. *TEVA Brand Only 90 tablet 3   meloxicam (MOBIC) 7.5 MG tablet Take 1 tablet (7.5 mg total) by mouth daily. 10 tablet 0   nitrofurantoin, macrocrystal-monohydrate, (MACROBID) 100 MG capsule Take 1 capsule (100 mg total) by mouth 2 (two) times daily. 10 capsule 0   rosuvastatin (CRESTOR) 40 MG tablet Take 1 tablet (40 mg total) by mouth daily. 90 tablet 3   sodium chloride (MURO 128)  5 % ophthalmic solution Place 1 drop into both eyes 2 (two) times a day. Before Azopt     Vaginal Lubricant (REPLENS) GEL Place 1 application vaginally daily.     valACYclovir (VALTREX) 500 MG tablet Take one tablet po BID x 3 days 30 tablet 2   [DISCONTINUED] metoprolol tartrate (LOPRESSOR) 25 MG tablet Take 1 tablet (25 mg total) by mouth as directed. 1 pill every 8 hours as needed for tachycardia or palpitations. 30 tablet 2   No current facility-administered medications on file prior to visit.    Review of Systems     Objective:  There were no vitals filed for this visit. BP Readings from Last 3 Encounters:  01/19/22 116/78  01/18/22 134/76  01/17/22 136/70   Wt Readings from Last 3 Encounters:  01/19/22 169 lb (76.7 kg)  01/18/22 169 lb (76.7 kg)  01/17/22 169 lb (76.7 kg)   There is no height or weight on file to calculate BMI.    Physical Exam         Assessment & Plan:    See Problem List for Assessment and Plan of chronic medical problems.

## 2022-03-27 ENCOUNTER — Encounter: Payer: Self-pay | Admitting: Internal Medicine

## 2022-03-27 ENCOUNTER — Ambulatory Visit (HOSPITAL_COMMUNITY)
Admission: RE | Admit: 2022-03-27 | Discharge: 2022-03-27 | Disposition: A | Discharge status: Home / Self Care | Payer: Medicare Other | Source: Ambulatory Visit | Attending: Internal Medicine | Admitting: Internal Medicine

## 2022-03-27 ENCOUNTER — Ambulatory Visit (INDEPENDENT_AMBULATORY_CARE_PROVIDER_SITE_OTHER): Payer: Medicare Other | Admitting: Internal Medicine

## 2022-03-27 VITALS — BP 124/76 | HR 75 | Temp 98.3°F | Ht 64.5 in | Wt 167.0 lb

## 2022-03-27 DIAGNOSIS — I700 Atherosclerosis of aorta: Secondary | ICD-10-CM | POA: Insufficient documentation

## 2022-03-27 DIAGNOSIS — M79662 Pain in left lower leg: Secondary | ICD-10-CM | POA: Insufficient documentation

## 2022-03-27 DIAGNOSIS — I872 Venous insufficiency (chronic) (peripheral): Secondary | ICD-10-CM | POA: Diagnosis not present

## 2022-03-27 DIAGNOSIS — D2271 Melanocytic nevi of right lower limb, including hip: Secondary | ICD-10-CM | POA: Insufficient documentation

## 2022-03-27 NOTE — Assessment & Plan Note (Signed)
Chronic Has had intermittent swelling when traveling related to varicose veins/venous insufficiency Symptoms currently resolved since being home Advised regular walking, elevating legs when sitting, wearing compression socks especially with travel and avoiding high salt diet

## 2022-03-27 NOTE — Patient Instructions (Addendum)
     Medications changes include :   none    An ultrasound of your legs were ordered.     Someone will call you to schedule an appointment.

## 2022-03-27 NOTE — Assessment & Plan Note (Signed)
Subacute Has been experiencing intermittent pain in the left leg for at least 1 year Because the medication she is on she has always been concerned about a blood clot which I do not think is the case, but she does have some venous insufficiency and we will go ahead and get an ultrasound of both legs to rule out blood clot, Baker's cyst, which is negative Possibly muscular skeletal in nature-advised follow-up with sports medicine for further evaluation and treatment

## 2022-03-28 ENCOUNTER — Other Ambulatory Visit: Payer: Self-pay | Admitting: Internal Medicine

## 2022-03-28 ENCOUNTER — Other Ambulatory Visit: Payer: Self-pay | Admitting: Cardiovascular Disease

## 2022-03-28 ENCOUNTER — Telehealth: Payer: Self-pay | Admitting: Cardiovascular Disease

## 2022-03-28 MED ORDER — EZETIMIBE 10 MG PO TABS: 10.0000 mg | ORAL_TABLET | Freq: Every day | ORAL | 0 refills | Status: DC

## 2022-03-28 NOTE — Telephone Encounter (Signed)
*  STAT* If patient is at the pharmacy, call can be transferred to refill team.   1. Which medications need to be refilled? (please list name of each medication and dose if known)  ezetimibe (ZETIA) 10 MG tablet  2. Which pharmacy/location (including street and city if local pharmacy) is medication to be sent to? [place], [place].  3. Do they need a 30 day or 90 day supply?  90 day supply  Patient states she is completely out of medication.

## 2022-03-28 NOTE — Telephone Encounter (Signed)
Pt's medication was sent to pt's pharmacy as requested. Confirmation received.  °

## 2022-03-31 ENCOUNTER — Other Ambulatory Visit: Payer: Self-pay | Admitting: Internal Medicine

## 2022-03-31 ENCOUNTER — Other Ambulatory Visit (HOSPITAL_BASED_OUTPATIENT_CLINIC_OR_DEPARTMENT_OTHER): Payer: Self-pay

## 2022-04-02 ENCOUNTER — Other Ambulatory Visit (HOSPITAL_BASED_OUTPATIENT_CLINIC_OR_DEPARTMENT_OTHER): Payer: Self-pay

## 2022-04-02 MED ORDER — ROSUVASTATIN CALCIUM 40 MG PO TABS
40.0000 mg | ORAL_TABLET | Freq: Every day | ORAL | 3 refills | Status: DC
  Filled 2022-04-02 – 2022-04-03 (×2): qty 90, 90d supply, fill #0

## 2022-04-03 ENCOUNTER — Other Ambulatory Visit (HOSPITAL_BASED_OUTPATIENT_CLINIC_OR_DEPARTMENT_OTHER): Payer: Self-pay

## 2022-04-04 ENCOUNTER — Other Ambulatory Visit (HOSPITAL_BASED_OUTPATIENT_CLINIC_OR_DEPARTMENT_OTHER): Payer: Self-pay

## 2022-04-06 DIAGNOSIS — H2513 Age-related nuclear cataract, bilateral: Secondary | ICD-10-CM | POA: Diagnosis not present

## 2022-04-06 DIAGNOSIS — H524 Presbyopia: Secondary | ICD-10-CM | POA: Diagnosis not present

## 2022-04-06 DIAGNOSIS — H5712 Ocular pain, left eye: Secondary | ICD-10-CM | POA: Diagnosis not present

## 2022-04-06 DIAGNOSIS — H53453 Other localized visual field defect, bilateral: Secondary | ICD-10-CM | POA: Diagnosis not present

## 2022-04-06 DIAGNOSIS — H5213 Myopia, bilateral: Secondary | ICD-10-CM | POA: Diagnosis not present

## 2022-04-06 DIAGNOSIS — H52223 Regular astigmatism, bilateral: Secondary | ICD-10-CM | POA: Diagnosis not present

## 2022-04-06 DIAGNOSIS — H40053 Ocular hypertension, bilateral: Secondary | ICD-10-CM | POA: Diagnosis not present

## 2022-04-17 NOTE — Progress Notes (Signed)
Cardiology Office Note:    Date:  04/20/2022   ID:  Lindsey Barrett, DOB [birth year], MRN [id number]  PCP:  [name], MD   [facility] Cardiologist:  Shelva Majestic, MD Cardiology APP:  Ledora Bottcher, [place] {  Referring MD: [name], MD   Chief Complaint  Patient presents with   Follow-up    HLD, near syncope    History of Present Illness:    Lindsey Barrett is a 76 y.o. female with a hx of breast cancer, Gilbert's syndrome, hyperlipidemia, and presyncope.  She has a remote tobacco smoking history but quit in 2000.  She follows with pulmonology for a stable lung nodule.  She was referred to cardiology and was seen by Dr. Claiborne Billings for episodes of presyncope.  Echocardiogram showed LVEF 65-70% with mild LVH, and no significant valvular disease.  A heart monitor worn for 13 days showed sinus rhythm with an average heart rate of 74 bpm, nadir 54 bpm, no episodes of atrial fibrillation, pauses, or ectopy.  She was last seen by Dr. Claiborne Billings 02/28/2021 and was doing well at that time.  She continues to follow with oncology s/p lumpectomy and radiation therapy.  She presents today for routine annual cardiology follow-up.  Sounds like presyncope is associated with eating and moving right after eating. She also reports allergy to caffeine in that she gets jittery and can make her heart race. She is avoiding caffeine. She can get SOB with fast walking. She was on a cruise that was supposed to be in [place] just before the war started. She experienced lower extremity swelling on her cruise and DVT study was negative 03/27/22. She is managing well with compression socks. She states she has poor circulation. She denies claudication.   Aortic atherosclerosis and coronary artery calcification noted on CT 10/2020. Given this and her family history of CAD, I recommended OTC fish oil.   Past Medical History:  Diagnosis Date   Anxiety    Atrophic vaginitis    Breast cancer  ([place])    Breast cancer of upper-inner quadrant of left female breast ([place]) 05/20/2015   Breast cyst    x3   DDD (degenerative disc disease)    Diverticulosis    Fasting hyperglycemia    GERD (gastroesophageal reflux disease)    [name] syndrome    Glaucoma    History of hiatal hernia    HSV infection    Hyperlipidemia    Radiation    left breast 50.4 gray   STD (sexually transmitted disease)    HSV    Past Surgical History:  Procedure Laterality Date   APPENDECTOMY  1961   BREAST CYST ASPIRATION     x2 right ,x1 left   COLONOSCOPY  2007   neg   DILATION AND CURETTAGE OF UTERUS  1975   FLEXIBLE SIGMOIDOSCOPY     x2   PELVIC LAPAROSCOPY     RADIOACTIVE SEED GUIDED PARTIAL MASTECTOMY WITH AXILLARY SENTINEL LYMPH NODE BIOPSY Left 05/31/2015   Procedure: LEFT BREAST RADIOACTIVE SEED GUIDED LUMPECTOMY WITH LEFT AXILLARY SENTINEL LYMPH NODE BIOPSY;  Surgeon: [name], MD;  Location: [facility];  Service: General;  Laterality: Left;   RE-EXCISION OF BREAST LUMPECTOMY Left 07/01/2015   Procedure: RE-EXCISION OF LEFT BREAST INFERIOR MARGIN;  Surgeon: [name], MD;  Location: [facility];  Service: General;  Laterality: Left;   UPPER GASTROINTESTINAL ENDOSCOPY     UPPER GI ENDOSCOPY  10/2010  Dr [name] TOOTH EXTRACTION  1978    Current Medications: Current Meds  Medication Sig   aspirin EC 81 MG tablet Take 1 tablet (81 mg total) by mouth daily.   bimatoprost (LUMIGAN) 0.01 % SOLN Place 1 drop into both eyes at bedtime.   brinzolamide (AZOPT) 1 % ophthalmic suspension 1 drop 2 (two) times daily.   calcium carbonate (TUMS - DOSED IN MG ELEMENTAL CALCIUM) 500 MG chewable tablet Chew 1 tablet by mouth as needed for indigestion or heartburn (1000 mg).    Carboxymethylcellulose Sodium (REFRESH LIQUIGEL OP) Apply 1 drop to eye 4 (four) times daily as needed.   Cholecalciferol (VITAMIN D3 PO) Take 5,000 Int'l Units by mouth daily.     [employer] SUNSCREEN SPF 30 EX apply topically to face and body daily for 30   ezetimibe (ZETIA) 10 MG tablet Take 1 tablet (10 mg total) by mouth daily.   famotidine (PEPCID) 10 MG tablet Take 1 tablet (10 mg total) by mouth daily. (Patient taking differently: Take 10 mg by mouth. Daily (may take twice daily if needed))   famotidine (PEPCID) 10 MG tablet Take 10 mg by mouth daily.   ibuprofen (ADVIL) 200 MG tablet Take 200 mg by mouth every 6 (six) hours as needed.   letrozole (FEMARA) 2.5 MG tablet Take 1 tablet (2.5 mg total) by mouth daily. *TEVA Brand Only   rosuvastatin (CRESTOR) 40 MG tablet Take 1 tablet by mouth once daily   sodium chloride (MURO 128) 5 % ophthalmic solution Place 1 drop into both eyes 2 (two) times a day. Before Azopt   Vaginal Lubricant (REPLENS) GEL Place 1 application vaginally daily.   [DISCONTINUED] famotidine 40 mg in sodium chloride 0.9 % 50 mL Inject 40 mg into the vein every 12 (twelve) hours.     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  Tobacco Use   Smoking status: Former    Types: Cigarettes    Quit date: 03/28/1999    Years since quitting: 23.0   Smokeless tobacco: Never   Tobacco comments:    smoked 1967-2000, up to 3/4 ppd  Vaping Use   Vaping Use: Never used  Substance and Sexual Activity   Alcohol use: Yes    Alcohol/week: 10.0 standard drinks of alcohol    Types: 10 Cans of beer per week   Drug use: No   Sexual activity: Not Currently    Birth control/protection: Post-menopausal    Comment: 1st intercourse 76 yo-More than 5 partners   Other Topics Concern   Not on file  Social History Narrative   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      Regular exercise-no   Social Determinants of Health   Financial Resource Strain: Low Risk  (04/24/2021)   Overall Financial Resource Strain (CARDIA)    Difficulty of Paying Living Expenses: Not hard at all  Food Insecurity: No Food Insecurity (04/24/2021)   Hunger Vital Sign    Worried About Running Out of Food in the Last Year: Never true    Ran Out of  Food in the Last Year: Never true  Transportation Needs: No Transportation Needs (04/24/2021)   PRAPARE - Hydrologist (Medical): No    Lack of Transportation (Non-Medical): No  Physical Activity: Insufficiently Active (04/24/2021)   Exercise Vital Sign    Days of Exercise per Week: 3 days    Minutes of Exercise per Session: 20 min  Stress: No Stress Concern Present (04/24/2021)   [place]    Feeling of Stress : Not at all  Social Connections: Moderately Integrated (04/24/2021)   Social Connection and Isolation Panel [NHANES]    Frequency of Communication with Friends and Family: Twice a week    Frequency of Social Gatherings with Friends and Family: Twice a week    Attends Religious Services: More than 4 times per year    Active Member of [employer] or Organizations: Yes    Attends Music therapist: More than 4 times per year    Marital Status: Divorced     Family History: The patient's family history includes Breast cancer in her paternal aunt; Cancer in her mother; Cancer (age of onset: 73) in her father; Colon cancer in her paternal aunt; Coronary artery disease in her father; Endometriosis in her mother; Leukemia in her maternal uncle; Liver cancer in her maternal uncle; Lung cancer in her mother; Stomach cancer in her maternal grandmother; Stroke (age of onset: 69) in her paternal grandfather;  Sudden death in her sister; Vascular Disease in her paternal grandmother. There is no history of Esophageal cancer, Rectal cancer, or Pancreatic cancer.  ROS:   Please see the history of present illness.     All other systems reviewed and are negative.  EKGs/Labs/Other Studies Reviewed:    The following studies were reviewed today:  Echo 2021  1. Left ventricular ejection fraction, by estimation, is 65 to 70%. The  left ventricle has normal function. The left ventricle has no regional  wall motion abnormalities. There is mild left ventricular hypertrophy.  Left ventricular diastolic parameters  were normal.   2. Right ventricular systolic function is normal. The right ventricular  size is normal. Tricuspid regurgitation signal is inadequate for assessing  PA pressure.   3. The mitral valve is normal in structure. No evidence of mitral valve  regurgitation.   4. The aortic valve is tricuspid. Aortic valve regurgitation is not  visualized. No aortic stenosis is present.   5. The inferior vena cava is normal in size with greater than 50%  respiratory variability, suggesting right atrial pressure of 3 mmHg.   EKG:  EKG is  ordered today.  The ekg ordered today demonstrates sinus rhythm with HR 72  Recent Labs: 05/26/2021: ALT 16; BUN 12; Creatinine, Ser 0.66; Hemoglobin 13.2; Platelets 218.0; Potassium 4.0; Sodium 141  Recent Lipid Panel    Component Value Date/Time   CHOL 169 05/26/2021 0858   CHOL 209 (H) 11/04/2019 1005   CHOL 216 (H) 06/18/2014 1053   TRIG 104.0 05/26/2021 0858   TRIG 110 06/18/2014 1053   HDL 50.90 05/26/2021 0858   HDL 46 11/04/2019 1005   HDL 52 06/18/2014 1053   CHOLHDL 3 05/26/2021 0858   VLDL 20.8 05/26/2021 0858   LDLCALC 97 05/26/2021 0858   LDLCALC 136 (H) 11/04/2019 1005   LDLCALC 142 (H) 06/18/2014 1053   LDLDIRECT 148.9 09/23/2009 0835     Risk Assessment/Calculations:  Physical Exam:    VS:  BP 122/68   Pulse 72    Ht 5' 4.5" (1.638 m)   Wt 169 lb 12.8 oz (77 kg)   SpO2 97%   BMI 28.70 kg/m     Wt Readings from Last 3 Encounters:  04/20/22 169 lb 12.8 oz (77 kg)  03/27/22 167 lb (75.8 kg)  01/19/22 169 lb (76.7 kg)     GEN:  Well nourished, well developed in no acute distress HEENT: Normal NECK: No JVD; No carotid bruits LYMPHATICS: No lymphadenopathy CARDIAC: RRR, no murmurs, rubs, gallops RESPIRATORY:  Clear to auscultation without rales, wheezing or rhonchi  ABDOMEN: Soft, non-tender, non-distended MUSCULOSKELETAL:  No edema; No deformity  SKIN: Warm and dry NEUROLOGIC:  Alert and oriented x 3 PSYCHIATRIC:  Normal affect   ASSESSMENT:    1. Pre-syncope; resolved   2. Mixed hyperlipidemia   3. Aortic atherosclerosis ([place])   4. Gastroesophageal reflux disease without esophagitis   5. Venous insufficiency of both lower extremities    PLAN:    In order of problems listed above:  Episodes of presyncope Presyncope following eating - no recent episodes She has a history of possible postprandial transient hypotension versus postprandial vagus nerve dysfunction. Heart monitor was largely unrevealing as was her echocardiogram   Hyperlipidemia with LDL target < 70 Aortic atherosclerosis Coronary artery calcifications Treated with rosuvastatin and Zetia 05/2021 TC 169 HDL 50 LDL 97 TG 104 Increase crestor 40 mg and zetia Zetia added on last year Continue ASA 81 mg   Lower extremity swelling Venous insufficiency Dependent edema She denies claudication, will hold off on ABIs    GERD Follows with Dr. Enis Gash   Follow up in 1 year.  I spent greater than 25 min     Medication Adjustments/Labs and Tests Ordered: Current medicines are reviewed at length with the patient today.  Concerns regarding medicines are outlined above.  Orders Placed This Encounter  Procedures   Lipid panel   Hepatic function panel   EKG 12-Lead   No orders of the defined types were placed  in this encounter.   Patient Instructions  Medication Instructions:  Start taking a over the counter Fish Oil daily Continue all other medications *If you need a refill on your cardiac medications before your next appointment, please call your pharmacy*   Lab Work: Have lipid and hepatic panels in January with Primary Care Dr.   Testing/Procedures: None ordered   Follow-Up: At [facility], you and your health needs are our priority.  As part of our continuing mission to provide you with exceptional heart care, we have created designated Provider Care Teams.  These Care Teams include your primary Cardiologist (physician) and Advanced Practice Providers (APPs -  Physician Assistants and Nurse Practitioners) who all work together to provide you with the care you need, when you need it.  We recommend signing up for the patient portal called "MyChart".  Sign up information is provided on this After Visit Summary.  MyChart is used to connect with patients for Virtual Visits (Telemedicine).  Patients are able to view lab/test results, encounter notes, upcoming appointments, etc.  Non-urgent messages can be sent to your provider as well.   To learn more about what you can do with MyChart, go to [email or web address].    Your next appointment:  1 year   Call in Sept to schedule Dec appointment     The format for your next appointment: Office     Provider:  Dr.Kelly   Important Information About Sugar         Signed, Ledora Bottcher, [place]  04/20/2022 3:30 PM    [facility]

## 2022-04-20 ENCOUNTER — Ambulatory Visit: Payer: Medicare Other | Attending: Physician Assistant | Admitting: Physician Assistant

## 2022-04-20 ENCOUNTER — Encounter: Payer: Self-pay | Admitting: Physician Assistant

## 2022-04-20 VITALS — BP 122/68 | HR 72 | Ht 64.5 in | Wt 169.8 lb

## 2022-04-20 DIAGNOSIS — R55 Syncope and collapse: Secondary | ICD-10-CM | POA: Diagnosis not present

## 2022-04-20 DIAGNOSIS — E782 Mixed hyperlipidemia: Secondary | ICD-10-CM | POA: Diagnosis not present

## 2022-04-20 DIAGNOSIS — I700 Atherosclerosis of aorta: Secondary | ICD-10-CM | POA: Insufficient documentation

## 2022-04-20 DIAGNOSIS — K219 Gastro-esophageal reflux disease without esophagitis: Secondary | ICD-10-CM | POA: Diagnosis not present

## 2022-04-20 DIAGNOSIS — I872 Venous insufficiency (chronic) (peripheral): Secondary | ICD-10-CM | POA: Insufficient documentation

## 2022-04-20 NOTE — Patient Instructions (Addendum)
Medication Instructions:  Start taking a over the counter Fish Oil daily Continue all other medications *If you need a refill on your cardiac medications before your next appointment, please call your pharmacy*   Lab Work: Have lipid and hepatic panels in January with Primary Care Dr.   Testing/Procedures: None ordered   Follow-Up: At [facility], you and your health needs are our priority.  As part of our continuing mission to provide you with exceptional heart care, we have created designated Provider Care Teams.  These Care Teams include your primary Cardiologist (physician) and Advanced Practice Providers (APPs -  Physician Assistants and Nurse Practitioners) who all work together to provide you with the care you need, when you need it.  We recommend signing up for the patient portal called "MyChart".  Sign up information is provided on this After Visit Summary.  MyChart is used to connect with patients for Virtual Visits (Telemedicine).  Patients are able to view lab/test results, encounter notes, upcoming appointments, etc.  Non-urgent messages can be sent to your provider as well.   To learn more about what you can do with MyChart, go to [email or web address].    Your next appointment:  1 year   Call in Sept to schedule Dec appointment     The format for your next appointment: Office     Provider:  [facility]   Important Information About Sugar

## 2022-04-26 ENCOUNTER — Ambulatory Visit: Payer: Medicare Other | Admitting: Internal Medicine

## 2022-04-27 ENCOUNTER — Ambulatory Visit: Payer: Medicare Other | Admitting: Internal Medicine

## 2022-05-09 ENCOUNTER — Other Ambulatory Visit (HOSPITAL_BASED_OUTPATIENT_CLINIC_OR_DEPARTMENT_OTHER): Payer: Self-pay

## 2022-05-10 NOTE — Patient Instructions (Addendum)
      Blood work was ordered.   The lab is on the first floor.    Medications changes include :   none   A diagnostic mammogram was ordered.   Return in about 1 year (around 05/12/2023) for follow up.

## 2022-05-10 NOTE — Progress Notes (Signed)
Subjective:    Patient ID: Lindsey Barrett, female    DOB: 04-20-1946, 77 y.o.   MRN: [id number]     HPI Lindsey Barrett is here for follow up of her chronic medical problems, including hld, prediabetes, GERD  H/o L breast ca in 2017.  The cancer was in the upper quadrant.  She has been having tenderness in lower breast.   Medications and allergies reviewed with patient and updated if appropriate.  Current Outpatient Medications on File Prior to Visit  Medication Sig Dispense Refill   aspirin EC 81 MG tablet Take 1 tablet (81 mg total) by mouth daily.     bimatoprost (LUMIGAN) 0.01 % SOLN Place 1 drop into both eyes at bedtime.     brinzolamide (AZOPT) 1 % ophthalmic suspension 1 drop 2 (two) times daily.     calcium carbonate (TUMS - DOSED IN MG ELEMENTAL CALCIUM) 500 MG chewable tablet Chew 1 tablet by mouth as needed for indigestion or heartburn (1000 mg).      Carboxymethylcellulose Sodium (REFRESH LIQUIGEL OP) Apply 1 drop to eye 4 (four) times daily as needed.     Cholecalciferol (VITAMIN D3 PO) Take 5,000 Int'l Units by mouth daily.      [employer]S SUNSCREEN SPF 30 EX apply topically to face and body daily for 30     ezetimibe (ZETIA) 10 MG tablet Take 1 tablet (10 mg total) by mouth daily. 90 tablet 0   famotidine (PEPCID) 10 MG tablet Take 10 mg by mouth daily.     ibuprofen (ADVIL) 200 MG tablet Take 200 mg by mouth every 6 (six) hours as needed.     letrozole (FEMARA) 2.5 MG tablet Take 1 tablet (2.5 mg total) by mouth daily. *TEVA Brand Only 90 tablet 3   rosuvastatin (CRESTOR) 40 MG tablet Take 1 tablet by mouth once daily 90 tablet 0   sodium chloride (MURO 128) 5 % ophthalmic solution Place 1 drop into both eyes 2 (two) times a day. Before Azopt     Vaginal Lubricant (REPLENS) GEL Place 2 application  vaginally 2 (two) times a week.     [DISCONTINUED] metoprolol tartrate (LOPRESSOR) 25 MG tablet Take 1 tablet (25 mg total) by mouth as directed. 1 pill every 8 hours as needed  for tachycardia or palpitations. 30 tablet 2   No current facility-administered medications on file prior to visit.     Review of Systems  Constitutional:  Negative for fever.  HENT:  Positive for postnasal drip.   Respiratory:  Negative for cough, shortness of breath and wheezing.   Cardiovascular:  Negative for chest pain, palpitations and leg swelling.  Neurological:  Positive for headaches (occ). Negative for light-headedness.       Objective:   Vitals:   05/11/22 1337  BP: 118/62  Pulse: 77  Temp: 97.6 F (36.4 C)   BP Readings from Last 3 Encounters:  05/11/22 118/62  05/11/22 118/62  04/20/22 122/68   Wt Readings from Last 3 Encounters:  05/11/22 170 lb (77.1 kg)  05/11/22 170 lb 9.6 oz (77.4 kg)  04/20/22 169 lb 12.8 oz (77 kg)   Body mass index is 28.73 kg/m.    Physical Exam Constitutional:      General: She is not in acute distress.    Appearance: Normal appearance.  HENT:     Head: Normocephalic and atraumatic.  Eyes:     Conjunctiva/sclera: Conjunctivae normal.  Cardiovascular:     Rate and Rhythm: Normal  rate and regular rhythm.     Heart sounds: Normal heart sounds. No murmur heard. Pulmonary:     Effort: Pulmonary effort is normal. No respiratory distress.     Breath sounds: Normal breath sounds. No wheezing.  Chest:  Breasts:    Right: Tenderness (minimal lower breast) present. No swelling, mass or skin change.     Left: Tenderness (across lower breast) present. No swelling, mass or skin change.  Musculoskeletal:     Cervical back: Neck supple.     Right lower leg: No edema.     Left lower leg: No edema.  Lymphadenopathy:     Cervical: No cervical adenopathy.     Upper Body:     Right upper body: No supraclavicular, axillary or pectoral adenopathy.     Left upper body: No supraclavicular, axillary or pectoral adenopathy.  Skin:    General: Skin is warm and dry.     Findings: No rash.  Neurological:     Mental Status: She is alert.  Mental status is at baseline.  Psychiatric:        Mood and Affect: Mood normal.        Behavior: Behavior normal.        Lab Results  Component Value Date   WBC 7.0 05/26/2021   HGB 13.2 05/26/2021   HCT 40.4 05/26/2021   PLT 218.0 05/26/2021   GLUCOSE 100 (H) 05/26/2021   CHOL 169 05/26/2021   TRIG 104.0 05/26/2021   HDL 50.90 05/26/2021   LDLDIRECT 148.9 09/23/2009   LDLCALC 97 05/26/2021   ALT 16 05/26/2021   AST 20 05/26/2021   NA 141 05/26/2021   K 4.0 05/26/2021   CL 106 05/26/2021   CREATININE 0.66 05/26/2021   BUN 12 05/26/2021   CO2 30 05/26/2021   TSH 1.79 10/18/2020   HGBA1C 6.1 05/26/2021   MICROALBUR 0.3 12/18/2006     Assessment & Plan:    See Problem List for Assessment and Plan of chronic medical problems.

## 2022-05-11 ENCOUNTER — Telehealth: Payer: Self-pay | Admitting: Internal Medicine

## 2022-05-11 ENCOUNTER — Encounter: Payer: Self-pay | Admitting: Internal Medicine

## 2022-05-11 ENCOUNTER — Ambulatory Visit (INDEPENDENT_AMBULATORY_CARE_PROVIDER_SITE_OTHER): Payer: Medicare Other

## 2022-05-11 ENCOUNTER — Ambulatory Visit (INDEPENDENT_AMBULATORY_CARE_PROVIDER_SITE_OTHER): Payer: Medicare Other | Admitting: Internal Medicine

## 2022-05-11 VITALS — BP 118/62 | HR 77 | Temp 97.6°F | Ht 64.5 in | Wt 170.0 lb

## 2022-05-11 VITALS — BP 118/62 | HR 77 | Temp 97.6°F | Resp 16 | Ht 64.5 in | Wt 170.6 lb

## 2022-05-11 DIAGNOSIS — R7303 Prediabetes: Secondary | ICD-10-CM

## 2022-05-11 DIAGNOSIS — E782 Mixed hyperlipidemia: Secondary | ICD-10-CM

## 2022-05-11 DIAGNOSIS — N644 Mastodynia: Secondary | ICD-10-CM

## 2022-05-11 DIAGNOSIS — C50212 Malignant neoplasm of upper-inner quadrant of left female breast: Secondary | ICD-10-CM

## 2022-05-11 DIAGNOSIS — Z0000 Encounter for general adult medical examination without abnormal findings: Secondary | ICD-10-CM | POA: Diagnosis not present

## 2022-05-11 DIAGNOSIS — Z170 Estrogen receptor positive status [ER+]: Secondary | ICD-10-CM

## 2022-05-11 DIAGNOSIS — I700 Atherosclerosis of aorta: Secondary | ICD-10-CM | POA: Diagnosis not present

## 2022-05-11 DIAGNOSIS — E559 Vitamin D deficiency, unspecified: Secondary | ICD-10-CM | POA: Diagnosis not present

## 2022-05-11 DIAGNOSIS — K219 Gastro-esophageal reflux disease without esophagitis: Secondary | ICD-10-CM

## 2022-05-11 NOTE — Patient Instructions (Addendum)
Lindsey Barrett , Thank you for taking time to come for your Medicare Wellness Visit. I appreciate your ongoing commitment to your health goals. Please review the following plan we discussed and let me know if I can assist you in the future.   These are the goals we discussed:  Goals      My goal for 2024 is to exercise more at [facility] [employer].        This is a list of the screening recommended for you and due dates:  Health Maintenance  Topic Date Due   Zoster (Shingles) Vaccine (1 of 2) Never done   COVID-19 Vaccine (5 - 2023-24 season) 01/05/2022   Mammogram  06/26/2022   Colon Cancer Screening  02/13/2023   DEXA scan (bone density measurement)  02/16/2023   Medicare Annual Wellness Visit  05/12/2023   DTaP/Tdap/Td vaccine (3 - Td or Tdap) 12/27/2031   Pneumonia Vaccine  Completed   Flu Shot  Completed   Hepatitis C Screening: USPSTF Recommendation to screen - Ages 18-79 yo.  Completed   HPV Vaccine  Aged Out    Advanced directives: Yes; documents on file.  Conditions/risks identified: Yes  Next appointment: Follow up in one year for your annual wellness visit.   Preventive Care 59 Years and Older, Female Preventive care refers to lifestyle choices and visits with your health care provider that can promote health and wellness. What does preventive care include? A yearly physical exam. This is also called an annual well check. Dental exams once or twice a year. Routine eye exams. Ask your health care provider how often you should have your eyes checked. Personal lifestyle choices, including: Daily care of your teeth and gums. Regular physical activity. Eating a healthy diet. Avoiding tobacco and drug use. Limiting alcohol use. Practicing safe sex. Taking low-dose aspirin every day. Taking vitamin and mineral supplements as recommended by your health care provider. What happens during an annual well check? The services and screenings done by your health care provider during  your annual well check will depend on your age, overall health, lifestyle risk factors, and family history of disease. Counseling  Your health care provider may ask you questions about your: Alcohol use. Tobacco use. Drug use. Emotional well-being. Home and relationship well-being. Sexual activity. Eating habits. History of falls. Memory and ability to understand (cognition). Work and work Statistician. Reproductive health. Screening  You may have the following tests or measurements: Height, weight, and BMI. Blood pressure. Lipid and cholesterol levels. These may be checked every 5 years, or more frequently if you are over 1 years old. Skin check. Lung cancer screening. You may have this screening every year starting at age 10 if you have a 30-pack-year history of smoking and currently smoke or have quit within the past 15 years. Fecal occult blood test (FOBT) of the stool. You may have this test every year starting at age 80. Flexible sigmoidoscopy or colonoscopy. You may have a sigmoidoscopy every 5 years or a colonoscopy every 10 years starting at age 38. Hepatitis C blood test. Hepatitis B blood test. Sexually transmitted disease (STD) testing. Diabetes screening. This is done by checking your blood sugar (glucose) after you have not eaten for a while (fasting). You may have this done every 1-3 years. Bone density scan. This is done to screen for osteoporosis. You may have this done starting at age 10. Mammogram. This may be done every 1-2 years. Talk to your health care provider about how often  you should have regular mammograms. Talk with your health care provider about your test results, treatment options, and if necessary, the need for more tests. Vaccines  Your health care provider may recommend certain vaccines, such as: Influenza vaccine. This is recommended every year. Tetanus, diphtheria, and acellular pertussis (Tdap, Td) vaccine. You may need a Td booster every 10  years. Zoster vaccine. You may need this after age 71. Pneumococcal 13-valent conjugate (PCV13) vaccine. One dose is recommended after age 58. Pneumococcal polysaccharide (PPSV23) vaccine. One dose is recommended after age 15. Talk to your health care provider about which screenings and vaccines you need and how often you need them. This information is not intended to replace advice given to you by your health care provider. Make sure you discuss any questions you have with your health care provider. Document Released: 05/20/2015 Document Revised: 01/11/2016 Document Reviewed: 02/22/2015 Elsevier Interactive Patient Education  2017 [place] Prevention in the Home Falls can cause injuries. They can happen to people of all ages. There are many things you can do to make your home safe and to help prevent falls. What can I do on the outside of my home? Regularly fix the edges of walkways and driveways and fix any cracks. Remove anything that might make you trip as you walk through a door, such as a raised step or threshold. Trim any bushes or trees on the path to your home. Use bright outdoor lighting. Clear any walking paths of anything that might make someone trip, such as rocks or tools. Regularly check to see if handrails are loose or broken. Make sure that both sides of any steps have handrails. Any raised decks and porches should have guardrails on the edges. Have any leaves, snow, or ice cleared regularly. Use sand or salt on walking paths during winter. Clean up any spills in your garage right away. This includes oil or grease spills. What can I do in the bathroom? Use night lights. Install grab bars by the toilet and in the tub and shower. Do not use towel bars as grab bars. Use non-skid mats or decals in the tub or shower. If you need to sit down in the shower, use a plastic, non-slip stool. Keep the floor dry. Clean up any water that spills on the floor as soon as it  happens. Remove soap buildup in the tub or shower regularly. Attach bath mats securely with double-sided non-slip rug tape. Do not have throw rugs and other things on the floor that can make you trip. What can I do in the bedroom? Use night lights. Make sure that you have a light by your bed that is easy to reach. Do not use any sheets or blankets that are too big for your bed. They should not hang down onto the floor. Have a firm chair that has side arms. You can use this for support while you get dressed. Do not have throw rugs and other things on the floor that can make you trip. What can I do in the kitchen? Clean up any spills right away. Avoid walking on wet floors. Keep items that you use a lot in easy-to-reach places. If you need to reach something above you, use a strong step stool that has a grab bar. Keep electrical cords out of the way. Do not use floor polish or wax that makes floors slippery. If you must use wax, use non-skid floor wax. Do not have throw rugs and other things on  the floor that can make you trip. What can I do with my stairs? Do not leave any items on the stairs. Make sure that there are handrails on both sides of the stairs and use them. Fix handrails that are broken or loose. Make sure that handrails are as long as the stairways. Check any carpeting to make sure that it is firmly attached to the stairs. Fix any carpet that is loose or worn. Avoid having throw rugs at the top or bottom of the stairs. If you do have throw rugs, attach them to the floor with carpet tape. Make sure that you have a light switch at the top of the stairs and the bottom of the stairs. If you do not have them, ask someone to add them for you. What else can I do to help prevent falls? Wear shoes that: Do not have high heels. Have rubber bottoms. Are comfortable and fit you well. Are closed at the toe. Do not wear sandals. If you use a stepladder: Make sure that it is fully opened.  Do not climb a closed stepladder. Make sure that both sides of the stepladder are locked into place. Ask someone to hold it for you, if possible. Clearly mark and make sure that you can see: Any grab bars or handrails. First and last steps. Where the edge of each step is. Use tools that help you move around (mobility aids) if they are needed. These include: Canes. Walkers. Scooters. Crutches. Turn on the lights when you go into a dark area. Replace any light bulbs as soon as they burn out. Set up your furniture so you have a clear path. Avoid moving your furniture around. If any of your floors are uneven, fix them. If there are any pets around you, be aware of where they are. Review your medicines with your doctor. Some medicines can make you feel dizzy. This can increase your chance of falling. Ask your doctor what other things that you can do to help prevent falls. This information is not intended to replace advice given to you by your health care provider. Make sure you discuss any questions you have with your health care provider. Document Released: 02/17/2009 Document Revised: 09/29/2015 Document Reviewed: 05/28/2014 Elsevier Interactive Patient Education  2017 [employer].

## 2022-05-11 NOTE — Assessment & Plan Note (Signed)
Chronic Taking vitamin D daily Check vitamin D level  

## 2022-05-11 NOTE — Progress Notes (Signed)
Subjective:   CAYCE PASCHAL is a 77 y.o. female who presents for Medicare Annual (Subsequent) preventive examination.  Review of Systems     Cardiac Risk Factors include: advanced age (>3mn, >>57women);dyslipidemia;family history of premature cardiovascular disease;sedentary lifestyle     Objective:    Today's Vitals   05/11/22 1248  Height: 5' 4.5" (1.638 m)  PainSc: 0-No pain   Body mass index is 28.7 kg/m.     05/11/2022   12:50 PM 06/14/2021    5:38 PM 04/24/2021    3:27 PM 10/31/2020   11:35 AM 02/15/2020    4:41 PM 01/16/2019    2:45 PM 07/14/2018    5:54 PM  Advanced Directives  Does Patient Have a Medical Advance Directive? Yes No Yes No Yes Yes Yes  Type of AParamedicof [place]Living will  [place] will   [place] will [place] will  Does patient want to make changes to medical advance directive? No - Patient declined    No - Patient declined  No - Patient declined  Copy of [place] Chart? Yes - validated most recent copy scanned in chart (See row information)  Yes - validated most recent copy scanned in chart (See row information)   Yes - validated most recent copy scanned in chart (See row information) No - copy requested  Would patient like information on creating a medical advance directive?  No - Patient declined  No - Patient declined       Current Medications (verified) Outpatient Encounter Medications as of 05/11/2022  Medication Sig   aspirin EC 81 MG tablet Take 1 tablet (81 mg total) by mouth daily.   bimatoprost (LUMIGAN) 0.01 % SOLN Place 1 drop into both eyes at bedtime.   brinzolamide (AZOPT) 1 % ophthalmic suspension 1 drop 2 (two) times daily.   calcium carbonate (TUMS - DOSED IN MG ELEMENTAL CALCIUM) 500 MG chewable tablet Chew 1 tablet by mouth as needed for indigestion or heartburn (1000 mg).     Carboxymethylcellulose Sodium (REFRESH LIQUIGEL OP) Apply 1 drop to eye 4 (four) times daily as needed.   Cholecalciferol (VITAMIN D3 PO) Take 5,000 Int'l Units by mouth daily.    [employer] SUNSCREEN SPF 30 EX apply topically to face and body daily for 30   ezetimibe (ZETIA) 10 MG tablet Take 1 tablet (10 mg total) by mouth daily.   famotidine (PEPCID) 10 MG tablet Take 10 mg by mouth daily.   ibuprofen (ADVIL) 200 MG tablet Take 200 mg by mouth every 6 (six) hours as needed.   letrozole (FEMARA) 2.5 MG tablet Take 1 tablet (2.5 mg total) by mouth daily. *TEVA Brand Only   rosuvastatin (CRESTOR) 40 MG tablet Take 1 tablet by mouth once daily   sodium chloride (MURO 128) 5 % ophthalmic solution Place 1 drop into both eyes 2 (two) times a day. Before Azopt   Vaginal Lubricant (REPLENS) GEL Place 1 application vaginally daily.   [DISCONTINUED] metoprolol tartrate (LOPRESSOR) 25 MG tablet Take 1 tablet (25 mg total) by mouth as directed. 1 pill every 8 hours as needed for tachycardia or palpitations.   No facility-administered encounter medications on file as of 05/11/2022.    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   Breast cyst    x3   DDD (degenerative disc disease)    Diverticulosis    Fasting hyperglycemia    GERD (gastroesophageal reflux disease)    [name] syndrome    Glaucoma    History of hiatal hernia    HSV infection    Hyperlipidemia    Radiation    left breast 50.4 gray   STD (sexually transmitted disease)    HSV   Past Surgical History:  Procedure Laterality Date   APPENDECTOMY  1961   BREAST CYST ASPIRATION     x2 right ,x1 left    COLONOSCOPY  2007   neg   DILATION AND CURETTAGE OF UTERUS  1975   FLEXIBLE SIGMOIDOSCOPY     x2   PELVIC LAPAROSCOPY     RADIOACTIVE SEED GUIDED PARTIAL MASTECTOMY WITH AXILLARY SENTINEL LYMPH NODE BIOPSY Left 05/31/2015   Procedure: LEFT BREAST RADIOACTIVE SEED GUIDED LUMPECTOMY WITH LEFT AXILLARY SENTINEL LYMPH NODE BIOPSY;  Surgeon: [name], MD;  Location: [facility];  Service: General;  Laterality: Left;   RE-EXCISION OF BREAST LUMPECTOMY Left 07/01/2015   Procedure: RE-EXCISION OF LEFT BREAST INFERIOR MARGIN;  Surgeon: [name], MD;  Location: [facility];  Service: General;  Laterality: Left;   UPPER GASTROINTESTINAL ENDOSCOPY     UPPER GI ENDOSCOPY  10/2010   Dr [name] TOOTH EXTRACTION  1978   Family History  Problem Relation Age of Onset   Lung cancer Mother        smoker   Endometriosis Mother    Cancer Mother        Bladder cancer   Stomach cancer Maternal Grandmother    Coronary artery disease Father    Cancer Father 6       intestinal carcinoid tumors of the ileum   Sudden death Sister        33 week old, congenital deformity   Liver cancer Maternal Uncle    Stroke Paternal Grandfather 56   Leukemia Maternal Uncle    Breast cancer Paternal Aunt        great paternal aunt-Age 6's   Colon cancer Paternal Aunt        lived to 63; pat great aunt   Vascular Disease Paternal Grandmother        thoracic aneurysm   Esophageal cancer Neg Hx    Rectal cancer Neg Hx    Pancreatic cancer Neg Hx    Social History   Socioeconomic History   Marital status: Divorced    Spouse name: Not on file   Number of children: 0   Years of education: Not on file   Highest education level: Not on file  Occupational History   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  Tobacco Use   Smoking status: Former    Types: Cigarettes    Quit date: 03/28/1999    Years since quitting: 23.1   Smokeless tobacco: Never   Tobacco  comments:    smoked 1967-2000, up to 3/4 ppd  Vaping Use   Vaping Use: Never used  Substance and Sexual Activity   Alcohol use: Yes    Alcohol/week: 10.0 standard drinks of alcohol    Types: 10 Cans of beer per week   Drug use: No   Sexual activity: Not Currently    Birth control/protection: Post-menopausal    Comment: 1st intercourse  77 yo-More than 5 partners  Other Topics Concern   Not on file  Social History Narrative   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      Regular exercise-no   Social Determinants of Health   Financial Resource Strain: Low Risk  (05/11/2022)   Overall Financial Resource Strain (CARDIA)    Difficulty of Paying Living Expenses: Not hard at all  Food Insecurity: No Food Insecurity (05/10/2022)   Hunger Vital Sign    Worried About Running Out of Food in the Last Year: Never true    Ran Out of Food in the Last Year: Never true  Transportation Needs: No Transportation Needs (05/10/2022)   PRAPARE - Hydrologist (Medical): No    Lack of Transportation (Non-Medical): No  Physical Activity: Insufficiently Active (05/10/2022)   Exercise Vital Sign    Days of Exercise per Week: 2 days    Minutes of Exercise per Session: 20 min  Stress: No Stress Concern Present (05/11/2022)   [place]    Feeling of Stress : Not at all  Social Connections: Moderately Integrated (05/11/2022)   Social Connection and Isolation Panel [NHANES]    Frequency of Communication with Friends and Family: More than three times a week    Frequency of Social Gatherings with Friends and Family: More than three times a week    Attends Religious Services: More than 4 times per year    Active Member of [employer] or Organizations: Yes    Attends Music therapist: More than 4 times per year    Marital Status: Divorced    Tobacco Counseling Counseling given: Not  Answered Tobacco comments: smoked 1967-2000, up to 3/4 ppd   Clinical Intake:  Pre-visit preparation completed: Yes  Pain : No/denies pain Pain Score: 0-No pain     Nutritional Risks: None Diabetes: No  How often do you need to have someone help you when you read instructions, pamphlets, or other written materials from your doctor or pharmacy?: 1 - Never What is the last grade level you completed in school?: HSG  Diabetic? No  Interpreter Needed?: No  Information entered by :: Lisette Abu, LPN.   Activities of Daily Living    05/11/2022   12:52 PM 05/10/2022    2:41 PM  In your present state of health, do you have any difficulty performing the following activities:  Hearing? 0 0  Vision? 0 0  Difficulty concentrating or making decisions? 0 0  Walking or climbing stairs? 0 0  Dressing or bathing? 0 0  Doing errands, shopping? 0 0  Preparing Food and eating ? N N  Using the Toilet? N N  In the past six months, have you accidently leaked urine? N N  Do you have problems with loss of bowel control? N N  Managing your Medications? N N  Managing your Finances? N N  Housekeeping or managing your Housekeeping? N N    Patient Care Team: [name], MD as PCP - General (Internal Medicine) Troy Sine, MD as PCP - Cardiology (Cardiology) Fontaine, Belinda Block, MD (Inactive) as Consulting Physician (Gynecology) Armbruster, Carlota Raspberry, MD as Consulting Physician (Gastroenterology) Calvert Cantor, MD as Consulting Physician (Ophthalmology) Szabat, Darnelle Maffucci, [facility] (Inactive) as Pharmacist (Pharmacist) [place], Tami Lin, [place] as Physician Assistant (Cardiology)  Indicate any recent Medical Services you may have received from other than [facility] providers in  the past year (date may be approximate).     Assessment:   This is a routine wellness examination for [place].  Hearing/Vision screen Hearing Screening - Comments:: Denies hearing difficulties   Vision Screening -  Comments:: Wears rx glasses - up to date with routine eye exams with [facility]   Dietary issues and exercise activities discussed: Current Exercise Habits: The patient does not participate in regular exercise at present, Exercise limited by: None identified   Goals Addressed   None   Depression Screen    03/27/2022    1:45 PM 10/26/2021    1:49 PM 04/24/2021    3:31 PM 04/24/2021    3:22 PM 02/15/2020    2:48 PM 01/16/2019    3:44 PM 01/10/2018    2:44 PM  PHQ 2/9 Scores  PHQ - 2 Score 0 0 0 0 0 1 0  PHQ- 9 Score       2    Fall Risk    05/11/2022   12:51 PM 05/10/2022    2:41 PM 03/27/2022    1:45 PM 10/26/2021    1:49 PM 04/24/2021    3:30 PM  [place] in the past year? 0 0 0 0 0  Number falls in past yr: 0 0 0  0  Injury with Fall? 0 0 0  0  Risk for fall due to : No Fall Risks  No Fall Risks    Follow up Falls prevention discussed  Falls evaluation completed  Falls evaluation completed    [place]:  Any stairs in or around the home? Yes  If so, are there any without handrails? No  Home free of loose throw rugs in walkways, pet beds, electrical cords, etc? Yes  Adequate lighting in your home to reduce risk of falls? Yes   ASSISTIVE DEVICES UTILIZED TO PREVENT FALLS:  Life alert? No  Use of a cane, walker or w/c? No  Grab bars in the bathroom? Yes  Shower chair or bench in shower? Yes  Elevated toilet seat or a handicapped toilet? Yes   TIMED UP AND GO:  Was the test performed? Yes .  Length of time to ambulate 10 feet: 8 sec.   Gait steady and fast without use of assistive device  Cognitive Function:        05/11/2022   12:51 PM 02/15/2020    4:45 PM 01/16/2019    2:50 PM  6CIT Screen  What Year? 0 points 0 points   What month? 0 points 0 points 0 points  What time? 0 points 0 points 0 points  Count back from 20 0 points 0 points 0 points  Months in reverse 0 points 0 points 0 points  Repeat  phrase 0 points 0 points 0 points  Total Score 0 points 0 points     Immunizations Immunization History  Administered Date(s) Administered   Fluad Quad(high Dose 65+) 01/22/2019, 02/15/2020, 04/25/2021, 03/08/2022   Hepatitis A, Adult 02/17/2016, 07/11/2018, 01/16/2019   Influenza Split 04/18/2011   Influenza, High Dose Seasonal PF 05/26/2013, 01/27/2016, 02/26/2017, 02/05/2018   Influenza, Seasonal, Injecte, Preservative Fre 04/17/2012   PFIZER Comirnaty(Gray Top)Covid-19 Tri-Sucrose Vaccine 12/13/2020   PFIZER(Purple Top)SARS-COV-2 Vaccination 06/12/2019, 07/07/2019, 03/19/2020   Pneumococcal Conjugate-13 02/17/2016   Pneumococcal Polysaccharide-23 07/02/2012   Tdap 08/02/2010, 12/26/2021    TDAP status: Up to date  Flu Vaccine status: Up to date  Pneumococcal vaccine status: Up to date  Covid-19 vaccine status: Completed vaccines  Qualifies for Shingles Vaccine? Yes   Zostavax completed Yes   Shingrix Completed?: No.    Education has been provided regarding the importance of this vaccine. Patient has been advised to call insurance company to determine out of pocket expense if they have not yet received this vaccine. Advised may also receive vaccine at local pharmacy or Health Dept. Verbalized acceptance and understanding.  Screening Tests Health Maintenance  Topic Date Due   Zoster Vaccines- Shingrix (1 of 2) Never done   COVID-19 Vaccine (5 - 2023-24 season) 01/05/2022   MAMMOGRAM  06/26/2022   COLONOSCOPY (Pts 45-22yr Insurance coverage will need to be confirmed)  02/13/2023   DEXA SCAN  02/16/2023   Medicare Annual Wellness (AWV)  05/12/2023   DTaP/Tdap/Td (3 - Td or Tdap) 12/27/2031   Pneumonia Vaccine 75 Years old  Completed   INFLUENZA VACCINE  Completed   Hepatitis C Screening  Completed   HPV VACCINES  Aged Out    Health Maintenance  Health Maintenance Due  Topic Date Due   Zoster Vaccines- Shingrix (1 of 2) Never done   COVID-19 Vaccine (5 - 2023-24  season) 01/05/2022    Colorectal cancer screening: Type of screening: Colonoscopy. Completed 02/13/2016. Repeat every 7 years  Mammogram status: Completed 06/28/2021. Repeat every year  Bone Density status: Completed 02/16/2020. Results reflect: Bone density results: NORMAL. Repeat every 3 years.  Lung Cancer Screening: (Low Dose CT Chest recommended if Age 949-80years, 30 pack-year currently smoking OR have quit w/in 15years.) does not qualify.   Lung Cancer Screening Referral: no  Additional Screening:  Hepatitis C Screening: does qualify; Completed 08/10/2015  Vision Screening: Recommended annual ophthalmology exams for early detection of glaucoma and other disorders of the eye. Is the patient up to date with their annual eye exam?  Yes  Who is the provider or what is the name of the office in which the patient attends annual eye exams[facility] a provider, would they like to be referred to a provider to establish care? No .   Dental Screening: Recommended annual dental exams for proper oral hygiene  Community Resource Referral / Chronic Care Management: CRR required this visit?  No   CCM required this visit?  No      Plan:     I have personally reviewed and noted the following in the patient's chart:   Medical and social history Use of alcohol, tobacco or illicit drugs  Current medications and supplements including opioid prescriptions. Patient is not currently taking opioid prescriptions. Functional ability and status Nutritional status Physical activity Advanced directives List of other physicians Hospitalizations, surgeries, and ER visits in previous 12 months Vitals Screenings to include cognitive, depression, and falls Referrals and appointments  In addition, I have reviewed and discussed with patient certain preventive protocols, quality metrics, and best practice recommendations. A written personalized care plan for  preventive services as well as general preventive health recommendations were provided to patient.     SSheral Flow LPN   17/06/5364  Nurse Notes:  Normal cognitive status assessed by direct observation by this Nurse Health Advisor. No abnormalities found.

## 2022-05-11 NOTE — Assessment & Plan Note (Signed)
Chronic Regular exercise and healthy diet encouraged Check lipid panel, CMP, CBC Continue Crestor 40 mg daily, Zetia 10 mg daily

## 2022-05-11 NOTE — Assessment & Plan Note (Signed)
Chronic GERD controlled Continue famotidine 10 mg daily

## 2022-05-11 NOTE — Telephone Encounter (Signed)
Patient called and said that her mammogram needs to be sent to [place] - It was sent to the [facility] and she does not want to have it done there.

## 2022-05-11 NOTE — Assessment & Plan Note (Signed)
Chronic Check a1c, CMP, CBC Low sugar / carb diet Stressed regular exercise

## 2022-05-11 NOTE — Assessment & Plan Note (Signed)
Chronic Continue crestor 40 mg daily, zetia 10 mg daily

## 2022-05-18 ENCOUNTER — Other Ambulatory Visit (INDEPENDENT_AMBULATORY_CARE_PROVIDER_SITE_OTHER): Payer: Medicare Other

## 2022-05-18 DIAGNOSIS — E782 Mixed hyperlipidemia: Secondary | ICD-10-CM

## 2022-05-18 DIAGNOSIS — K219 Gastro-esophageal reflux disease without esophagitis: Secondary | ICD-10-CM

## 2022-05-18 DIAGNOSIS — E559 Vitamin D deficiency, unspecified: Secondary | ICD-10-CM

## 2022-05-18 DIAGNOSIS — N644 Mastodynia: Secondary | ICD-10-CM | POA: Diagnosis not present

## 2022-05-18 DIAGNOSIS — R7303 Prediabetes: Secondary | ICD-10-CM

## 2022-05-18 DIAGNOSIS — R922 Inconclusive mammogram: Secondary | ICD-10-CM | POA: Diagnosis not present

## 2022-05-18 DIAGNOSIS — Z853 Personal history of malignant neoplasm of breast: Secondary | ICD-10-CM | POA: Diagnosis not present

## 2022-05-18 LAB — COMPREHENSIVE METABOLIC PANEL
ALT: 18 U/L (ref 0–35)
AST: 23 U/L (ref 0–37)
Albumin: 4.4 g/dL (ref 3.5–5.2)
Alkaline Phosphatase: 54 U/L (ref 39–117)
BUN: 12 mg/dL (ref 6–23)
CO2: 31 mEq/L (ref 19–32)
Calcium: 9.2 mg/dL (ref 8.4–10.5)
Chloride: 104 mEq/L (ref 96–112)
Creatinine, Ser: 0.68 mg/dL (ref 0.40–1.20)
GFR: 85.52 mL/min (ref >60.00)
Glucose, Bld: 102 mg/dL — ABNORMAL HIGH (ref 70–99)
Potassium: 4 mEq/L (ref 3.5–5.1)
Sodium: 141 mEq/L (ref 135–145)
Total Bilirubin: 2 mg/dL — ABNORMAL HIGH (ref 0.2–1.2)
Total Protein: 7 g/dL (ref 6.0–8.3)

## 2022-05-18 LAB — CBC WITH DIFFERENTIAL/PLATELET
Basophils Absolute: 0 10*3/uL (ref 0.0–0.1)
Basophils Relative: 0.5 % (ref 0.0–3.0)
Eosinophils Absolute: 0.3 10*3/uL (ref 0.0–0.7)
Eosinophils Relative: 3.7 % (ref 0.0–5.0)
HCT: 43.4 % (ref 36.0–46.0)
Hemoglobin: 14.2 g/dL (ref 12.0–15.0)
Lymphocytes Relative: 41 % (ref 12.0–46.0)
Lymphs Abs: 3.2 10*3/uL (ref 0.7–4.0)
MCHC: 32.7 g/dL (ref 30.0–36.0)
MCV: 87 fl (ref 78.0–100.0)
Monocytes Absolute: 0.8 10*3/uL (ref 0.1–1.0)
Monocytes Relative: 9.8 % (ref 3.0–12.0)
Neutro Abs: 3.5 10*3/uL (ref 1.4–7.7)
Neutrophils Relative %: 45 % (ref 43.0–77.0)
Platelets: 251 10*3/uL (ref 150.0–400.0)
RBC: 4.99 Mil/uL (ref 3.87–5.11)
RDW: 14.2 % (ref 11.5–15.5)
WBC: 7.9 10*3/uL (ref 4.0–10.5)

## 2022-05-18 LAB — LIPID PANEL
Cholesterol: 179 mg/dL (ref 0–200)
HDL: 57.1 mg/dL (ref >39.00)
LDL Cholesterol: 98 mg/dL (ref 0–99)
NonHDL: 122.25
Total CHOL/HDL Ratio: 3
Triglycerides: 119 mg/dL (ref 0.0–149.0)
VLDL: 23.8 mg/dL (ref 0.0–40.0)

## 2022-05-18 LAB — VITAMIN D 25 HYDROXY (VIT D DEFICIENCY, FRACTURES): VITD: 63.16 ng/mL (ref 30.00–100.00)

## 2022-05-18 LAB — HM MAMMOGRAPHY

## 2022-05-18 LAB — HEMOGLOBIN A1C: Hgb A1c MFr Bld: 6.4 % (ref 4.6–6.5)

## 2022-05-19 ENCOUNTER — Encounter: Payer: Self-pay | Admitting: Family Medicine

## 2022-05-19 ENCOUNTER — Encounter: Payer: Self-pay | Admitting: Hematology and Oncology

## 2022-05-19 ENCOUNTER — Encounter: Payer: Self-pay | Admitting: Cardiovascular Disease

## 2022-05-21 ENCOUNTER — Other Ambulatory Visit: Payer: Self-pay | Admitting: Physician Assistant

## 2022-05-21 ENCOUNTER — Other Ambulatory Visit: Payer: Self-pay | Admitting: *Deleted

## 2022-05-21 DIAGNOSIS — E782 Mixed hyperlipidemia: Secondary | ICD-10-CM

## 2022-05-22 ENCOUNTER — Encounter: Payer: Self-pay | Admitting: Obstetrics and Gynecology

## 2022-05-22 ENCOUNTER — Inpatient Hospital Stay: Payer: Medicare Other | Attending: Adult Health | Admitting: Adult Health

## 2022-05-22 VITALS — BP 136/69 | HR 78 | Temp 97.7°F | Resp 16 | Ht 64.0 in | Wt 167.3 lb

## 2022-05-22 DIAGNOSIS — Z79899 Other long term (current) drug therapy: Secondary | ICD-10-CM | POA: Diagnosis not present

## 2022-05-22 DIAGNOSIS — C50212 Malignant neoplasm of upper-inner quadrant of left female breast: Secondary | ICD-10-CM | POA: Insufficient documentation

## 2022-05-22 DIAGNOSIS — Z170 Estrogen receptor positive status [ER+]: Secondary | ICD-10-CM | POA: Diagnosis not present

## 2022-05-22 DIAGNOSIS — Z923 Personal history of irradiation: Secondary | ICD-10-CM | POA: Diagnosis not present

## 2022-05-22 NOTE — Progress Notes (Signed)
[facility] [place] Phone: [phone] Subjective:   Lindsey Barrett, am serving as a scribe for Dr. Hulan Saas.  I'm seeing this patient by the request  of:  [name], MD  CC: Multiple joint complaints  [id number]  01/18/2022 Discussed which activities to do which ones to avoid.  We discussed potential bracing.  Worsening pain can consider injection     Doing well overall  Discussed home exercise.  Patient is down from abdominal at this point.  No other changes in management.  Follow-up with me in 3 to 4 months      Update 05/24/2022 Lindsey Barrett is a 77 y.o. female coming in with complaint of L thumb and LBP. Patient states that her thumb joint hurts a lot. She crochets at night after dinner and it will hurt. Patient states went on a trip in October to the [place], patient discovered she did not wear her compression socks and did a lot of walking and that night she had leg swelling with splotching. Patient went to emergency room on the ship and was diagnoses with poor circulation from her knees to her feet. When she got back saw Dr. Quay Burow and was referred to vein specialist and the report is in. Nothing major was found. Patient is having some issues with her left shoulder had a pop sound and now she is having issues with the left shoulder. Having some neck tightness hurts when moving her head to the right. Patient is worried the estrogen reduction medication she is on is causing some of these issues.       Past Medical History:  Diagnosis Date   Anxiety    Atrophic vaginitis    Breast cancer ([place])    Breast cancer of upper-inner quadrant of left female breast ([place]) 05/20/2015   Breast cyst    x3   DDD (degenerative disc disease)    Diverticulosis    Fasting hyperglycemia    GERD (gastroesophageal reflux disease)    [name] syndrome    Glaucoma    History of hiatal hernia    HSV infection     Hyperlipidemia    Radiation    left breast 50.4 gray   STD (sexually transmitted disease)    HSV   Past Surgical History:  Procedure Laterality Date   APPENDECTOMY  1961   BREAST CYST ASPIRATION     x2 right ,x1 left   COLONOSCOPY  2007   neg   DILATION AND CURETTAGE OF UTERUS  1975   FLEXIBLE SIGMOIDOSCOPY     x2   PELVIC LAPAROSCOPY     RADIOACTIVE SEED GUIDED PARTIAL MASTECTOMY WITH AXILLARY SENTINEL LYMPH NODE BIOPSY Left 05/31/2015   Procedure: LEFT BREAST RADIOACTIVE SEED GUIDED LUMPECTOMY WITH LEFT AXILLARY SENTINEL LYMPH NODE BIOPSY;  Surgeon: [name], MD;  Location: [place];  Service: General;  Laterality: Left;   RE-EXCISION OF BREAST LUMPECTOMY Left 07/01/2015   Procedure: RE-EXCISION OF LEFT BREAST INFERIOR MARGIN;  Surgeon: [name], MD;  Location: [facility];  Service: General;  Laterality: Left;   UPPER GASTROINTESTINAL ENDOSCOPY     UPPER GI ENDOSCOPY  10/2010   Dr [name] TOOTH EXTRACTION  1978   Social History   Socioeconomic History   Marital status: Divorced    Spouse name: Not on file   Number of children: 0   Years of education: Not on file  Highest education level: Not on file  Occupational History   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  Tobacco Use   Smoking status: Former    Types: Cigarettes    Quit date: 03/28/1999    Years since quitting: 23.1   Smokeless tobacco: Never   Tobacco comments:    smoked 1967-2000, up to 3/4 ppd  Vaping Use   Vaping Use: Never used  Substance and Sexual Activity   Alcohol use: Yes    Alcohol/week: 10.0 standard drinks of alcohol    Types: 10 Cans of beer per week   Drug use: No   Sexual activity: Not Currently    Birth control/protection: Post-menopausal    Comment: 1st intercourse 77 yo-More than 5 partners  Other Topics Concern   Not on file  Social History Narrative   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       Regular exercise-no   Social Determinants of Health   Financial Resource Strain: Low Risk  (05/11/2022)   Overall Financial Resource Strain (CARDIA)    Difficulty of Paying Living Expenses: Not hard at all  Food Insecurity: No Food Insecurity (05/10/2022)   Hunger Vital Sign    Worried About Running Out of Food in the Last Year: Never true    Ran Out of Food in the Last Year: Never true  Transportation Needs: No Transportation Needs (05/10/2022)   PRAPARE - Hydrologist (Medical): No    Lack of Transportation (Non-Medical): No  Physical Activity: Insufficiently Active (05/10/2022)   Exercise Vital Sign    Days of Exercise per Week: 2 days    Minutes of Exercise per Session: 20 min  Stress: No Stress Concern Present (05/11/2022)   [place]    Feeling of Stress : Not at all  Social Connections: Moderately Integrated (05/11/2022)   Social Connection and Isolation Panel [NHANES]    Frequency of Communication with Friends and Family: More than three times a week    Frequency of Social Gatherings with Friends and Family: More than three times a week    Attends Religious Services: More than 4 times per year    Active Member of [employer] or Organizations: Yes    Attends Music therapist: More than 4 times per year    Marital Status: Divorced   Allergies  Allergen Reactions   Arimidex [Anastrozole] Other (See Comments)    Unable to function, loopy, dizzy, and mentally fogginess   Latex Rash and Other (See Comments)    Gloves, Band-Aids    Septra Ds [Sulfamethoxazole-Trimethoprim] Hives   Epinephrine Other (See Comments)   Sulfa Antibiotics Hives   Sulfur Other (See Comments)   Adhesive [Tape] Rash   Augmentin [Amoxicillin-Pot Clavulanate] Diarrhea   Family History  Problem Relation Age of Onset   Lung cancer Mother        smoker   Endometriosis Mother    Cancer Mother         Bladder cancer   Stomach cancer Maternal Grandmother    Coronary artery disease Father    Cancer Father 37       intestinal carcinoid tumors of the ileum   Sudden death Sister        52 week old, congenital deformity   Liver cancer Maternal Uncle    Stroke Paternal Grandfather 51   Leukemia Maternal Uncle    Breast cancer Paternal Aunt  great paternal aunt-Age 40's   Colon cancer Paternal Aunt        lived to 24; pat great aunt   Vascular Disease Paternal Grandmother        thoracic aneurysm   Esophageal cancer Neg Hx    Rectal cancer Neg Hx    Pancreatic cancer Neg Hx      Current Outpatient Medications (Cardiovascular):    ezetimibe (ZETIA) 10 MG tablet, Take 1 tablet (10 mg total) by mouth daily.   rosuvastatin (CRESTOR) 40 MG tablet, Take 1 tablet by mouth once daily   Current Outpatient Medications (Analgesics):    aspirin EC 81 MG tablet, Take 1 tablet (81 mg total) by mouth daily.   ibuprofen (ADVIL) 200 MG tablet, Take 200 mg by mouth every 6 (six) hours as needed.   Current Outpatient Medications (Other):    bimatoprost (LUMIGAN) 0.01 % SOLN, Place 1 drop into both eyes at bedtime.   brinzolamide (AZOPT) 1 % ophthalmic suspension, 1 drop 2 (two) times daily.   calcium carbonate (TUMS - DOSED IN MG ELEMENTAL CALCIUM) 500 MG chewable tablet, Chew 1 tablet by mouth as needed for indigestion or heartburn (1000 mg).    Carboxymethylcellulose Sodium (REFRESH LIQUIGEL OP), Apply 1 drop to eye 4 (four) times daily as needed.   Cholecalciferol (VITAMIN D3 PO), Take 5,000 Int'l Units by mouth daily.    [employer] SUNSCREEN SPF 30 EX, apply topically to face and body daily for 30   famotidine (PEPCID) 10 MG tablet, Take 10 mg by mouth daily.   gabapentin (NEURONTIN) 100 MG capsule, Take 1 capsule (100 mg total) by mouth at bedtime.   letrozole (FEMARA) 2.5 MG tablet, Take 1 tablet (2.5 mg total) by mouth daily. *TEVA Brand Only   sodium chloride (MURO 128) 5 % ophthalmic  solution, Place 1 drop into both eyes 2 (two) times a day. Before Azopt   Vaginal Lubricant (REPLENS) GEL, Place 2 application  vaginally 2 (two) times a week.   Reviewed prior external information including notes and imaging from  primary care provider As well as notes that were available from care everywhere and other healthcare systems.  Past medical history, social, surgical and family history all reviewed in electronic medical record.  No pertanent information unless stated regarding to the chief complaint.   Review of Systems:  No headache, visual changes, nausea, vomiting, diarrhea, constipation, dizziness, abdominal pain, skin rash, fevers, chills, night sweats, weight loss, swollen lymph nodes, body aches, joint swelling, chest pain, shortness of breath, mood changes. POSITIVE muscle aches  Objective  Blood pressure 118/70, pulse 74, height '5\' 4"'$  (1.626 m), weight 165 lb (74.8 kg), SpO2 96 %.   General: No apparent distress alert and oriented x3 mood and affect normal, dressed appropriately.  HEENT: Pupils equal, extraocular movements intact  Respiratory: Patient's speak in full sentences and does not appear short of breath  Cardiovascular: No lower extremity edema, non tender, no erythema  Patient's left knee does have some arthritic changes noted.  Mild fullness of the popliteal area that could be consistent with a Baker's cyst.  Patient does have crepitus noted with range of motion.  Positive grind test bilaterally. Neck exam does have some loss of lordosis.  Patient does have crepitus noted as well.  Positive Spurling's noted down the left arm in the C7 distribution.   Impression and Recommendations:

## 2022-05-22 NOTE — Progress Notes (Signed)
[facility] Follow up:    Lindsey Rail, MD [place] [address]   DIAGNOSIS:  Cancer Staging  Breast cancer of upper-inner quadrant of left female breast [facility]) Staging form: Breast, AJCC 7th Edition - Clinical stage from 05/25/2015: Stage IA (T1c, N0, M0) - Unsigned Staged by: Pathologist and managing physician Laterality: Left Estrogen receptor status: Positive Progesterone receptor status: Positive HER2 status: Negative Stage used in treatment planning: Yes National guidelines used in treatment planning: Yes Type of national guideline used in treatment planning: NCCN Staging comments: Staged at breast conference on 1.18.17 - Pathologic stage from 05/31/2015: Stage IA (T1c, N0, cM0) - Signed by Lindsey Bouche, NP on 01/02/2016 Staged by: Pathologist Laterality: Left Estrogen receptor status: Positive Progesterone receptor status: Positive HER2 status: Negative   SUMMARY OF ONCOLOGIC HISTORY: Oncology History  Breast cancer of upper-inner quadrant of left female breast ([facility])  05/18/2015 Initial Diagnosis   Left breast biopsy: Invasive ductal carcinoma low to intermediate grade, ER 100%, PR 95%, HER-2 negative ratio 1.02, Ki-67 5%, left breast asymmetry 1.3 cm, T1c N0 stage IA   05/31/2015 Surgery   Left lumpectomy Lindsey Barrett): Invasive ductal carcinoma grade 2, 1.5 cm, 0/3 lymph nodes negative, broadly involves inferior margin, ER 100%, PR 95%, HER-2 negative duration 112, Ki-67 5%, T1 cN0 stage IA, Oncotype DX score 18, 12% ROR   05/31/2015 Oncotype testing   Oncotype DX score: 18/12% ROR   07/01/2015 Surgery   Reexcision of the left inferior margin: Fibrocystic changes with UDH   08/10/2015 - 09/16/2015 Radiation Therapy   Adj XRT (Lindsey Barrett). 50.4 Gy in 28 fractions to the left breast   10/06/2015 -  Anti-estrogen oral therapy   Anastrozole 1 mg by mouth daily 5 years, switched to letrozole 11/09/2015 due to dizziness lightheadedness  and fatigue     CURRENT THERAPY: Letrozole  INTERVAL HISTORY: Lindsey Barrett 77 y.o. female returns for left breast tenderness and fullness in areas other than her surgical site.  She underwent bilateral breast diagnostic mammogram on 05/18/2022 that showed no evidence of malignancy or etiology for her pain.     Patient Active Problem List   Diagnosis Date Noted   DDD (degenerative disc disease), cervical 05/24/2022   Nevus of toe of right foot 03/27/2022   Venous insufficiency of both lower extremities 03/27/2022   Pain of left calf 03/27/2022   Aortic atherosclerosis ([facility]) 03/27/2022   Dysuria 10/26/2021   Arthritis of carpometacarpal ([place]) joint of left thumb 10/18/2021   Rash 08/18/2021   Light headedness 03/29/2021   Dermatitis 12/09/2020   Low back pain 10/18/2020   COVID-19 08/31/2020   Patellofemoral arthritis of right knee 08/25/2020   Left wrist pain 04/19/2020   Left leg pain 04/18/2020   Acute pain of right knee 03/09/2020   Sensorineural hearing loss (SNHL) of both ears 03/20/2019   Deviated nasal septum 03/02/2019   Pulmonary nodule 07/29/2018   Dupuytren's contracture of both hands 07/11/2018   Ganglion cyst of volar aspect of left wrist 06/04/2018   Frozen shoulder 03/18/2018   Right rotator cuff tear 10/15/2017   Tinnitus of both ears 01/01/2017   Reaction to internal stress 05/29/2016   Prediabetes 08/10/2015   Breast cancer of upper-inner quadrant of left female breast (Lindsey Barrett) 05/20/2015   Open-angle glaucoma 06/17/2014   Vitamin D deficiency 10/18/2011   HSV infection    Atrophic vaginitis    VARICOSE VEINS, LOWER EXTREMITIES 09/28/2009   GERD 07/14/2009  Hyperlipidemia 12/26/2006   GILBERT'S SYNDROME 08/09/2006   DEGENERATIVE DISC DISEASE 08/09/2006    is allergic to arimidex [anastrozole], latex, septra ds [sulfamethoxazole-trimethoprim], epinephrine, sulfa antibiotics, sulfur, adhesive [tape], and augmentin [amoxicillin-pot  clavulanate].  MEDICAL HISTORY: Past Medical History:  Diagnosis Date   Anxiety    Atrophic vaginitis    Breast cancer ([place])    Breast cancer of upper-inner quadrant of left female breast ([place]) 05/20/2015   Breast cyst    x3   DDD (degenerative disc disease)    Diverticulosis    Fasting hyperglycemia    GERD (gastroesophageal reflux disease)    [name] syndrome    Glaucoma    History of hiatal hernia    HSV infection    Hyperlipidemia    Radiation    left breast 50.4 gray   STD (sexually transmitted disease)    HSV    SURGICAL HISTORY: Past Surgical History:  Procedure Laterality Date   APPENDECTOMY  1961   BREAST CYST ASPIRATION     x2 right ,x1 left   COLONOSCOPY  2007   neg   DILATION AND CURETTAGE OF UTERUS  1975   FLEXIBLE SIGMOIDOSCOPY     x2   PELVIC LAPAROSCOPY     RADIOACTIVE SEED GUIDED PARTIAL MASTECTOMY WITH AXILLARY SENTINEL LYMPH NODE BIOPSY Left 05/31/2015   Procedure: LEFT BREAST RADIOACTIVE SEED GUIDED LUMPECTOMY WITH LEFT AXILLARY SENTINEL LYMPH NODE BIOPSY;  Surgeon: Lindsey Bookbinder, MD;  Location: [place];  Service: General;  Laterality: Left;   RE-EXCISION OF BREAST LUMPECTOMY Left 07/01/2015   Procedure: RE-EXCISION OF LEFT BREAST INFERIOR MARGIN;  Surgeon: Lindsey Bookbinder, MD;  Location: [place];  Service: General;  Laterality: Left;   UPPER GASTROINTESTINAL ENDOSCOPY     UPPER GI ENDOSCOPY  10/2010   Dr Lindsey Barrett TOOTH EXTRACTION  1978    SOCIAL HISTORY: Social History   Socioeconomic History   Marital status: Divorced    Spouse name: Not on file   Number of children: 0   Years of education: Not on file   Highest education level: Not on file  Occupational History   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  Tobacco Use   Smoking status: Former    Types: Cigarettes    Quit date: 03/28/1999    Years since quitting: 23.1   Smokeless tobacco: Never   Tobacco comments:    smoked  1967-2000, up to 3/4 ppd  Vaping Use   Vaping Use: Never used  Substance and Sexual Activity   Alcohol use: Yes    Alcohol/week: 10.0 standard drinks of alcohol    Types: 10 Cans of beer per week   Drug use: No   Sexual activity: Not Currently    Birth control/protection: Post-menopausal    Comment: 1st intercourse 77 yo-More than 5 partners  Other Topics Concern   Not on file  Social History Narrative   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      Regular exercise-no   Social Determinants of Health   Financial Resource Strain: Low Risk  (05/11/2022)   Overall Financial Resource Strain (CARDIA)    Difficulty of Paying Living Expenses: Not hard at all  Food Insecurity: No Food Insecurity (05/10/2022)   Hunger Vital Sign    Worried About Running Out of Food in the Last Year: Never true    Ran Out of Food in the Last Year: Never true  Transportation Needs: No Transportation  Needs (05/10/2022)   PRAPARE - Hydrologist (Medical): No    Lack of Transportation (Non-Medical): No  Physical Activity: Insufficiently Active (05/10/2022)   Exercise Vital Sign    Days of Exercise per Week: 2 days    Minutes of Exercise per Session: 20 min  Stress: No Stress Concern Present (05/11/2022)   [place]    Feeling of Stress : Not at all  Social Connections: Moderately Integrated (05/11/2022)   Social Connection and Isolation Panel [NHANES]    Frequency of Communication with Friends and Family: More than three times a week    Frequency of Social Gatherings with Friends and Family: More than three times a week    Attends Religious Services: More than 4 times per year    Active Member of [employer] or Organizations: Yes    Attends Music therapist: More than 4 times per year    Marital Status: Divorced  Intimate Partner Violence: Not At Risk (04/24/2021)   Humiliation, Afraid, Rape,  and Kick questionnaire    Fear of Current or Ex-Partner: No    Emotionally Abused: No    Physically Abused: No    Sexually Abused: No    FAMILY HISTORY: Family History  Problem Relation Age of Onset   Lung cancer Mother        smoker   Endometriosis Mother    Cancer Mother        Bladder cancer   Stomach cancer Maternal Grandmother    Coronary artery disease Father    Cancer Father 11       intestinal carcinoid tumors of the ileum   Sudden death Sister        14 week old, congenital deformity   Liver cancer Maternal Uncle    Stroke Paternal Grandfather 13   Leukemia Maternal Uncle    Breast cancer Paternal Aunt        great paternal aunt-Age 33's   Colon cancer Paternal Aunt        lived to 74; pat great aunt   Vascular Disease Paternal Grandmother        thoracic aneurysm   Esophageal cancer Neg Hx    Rectal cancer Neg Hx    Pancreatic cancer Neg Hx     Review of Systems  Constitutional:  Negative for appetite change, chills, fatigue, fever and unexpected weight change.  HENT:   Negative for hearing loss, lump/mass and trouble swallowing.   Eyes:  Negative for eye problems and icterus.  Respiratory:  Negative for chest tightness, cough and shortness of breath.   Cardiovascular:  Negative for chest pain, leg swelling and palpitations.  Gastrointestinal:  Negative for abdominal distention, abdominal pain, constipation, diarrhea, nausea and vomiting.  Endocrine: Negative for hot flashes.  Genitourinary:  Negative for difficulty urinating.   Musculoskeletal:  Negative for arthralgias.  Skin:  Negative for itching and rash.  Neurological:  Negative for dizziness, extremity weakness, headaches and numbness.  Hematological:  Negative for adenopathy. Does not bruise/bleed easily.  Psychiatric/Behavioral:  Negative for depression. The patient is not nervous/anxious.       PHYSICAL EXAMINATION  ECOG PERFORMANCE STATUS: 1 - Symptomatic but completely ambulatory  Vitals:    05/22/22 1210  BP: 136/69  Pulse: 78  Resp: 16  Temp: 97.7 F (36.5 C)  SpO2: 99%    Physical Exam Constitutional:      General: She is not in acute distress.  Appearance: Normal appearance. She is not toxic-appearing.  HENT:     Head: Normocephalic and atraumatic.  Eyes:     General: No scleral icterus. Cardiovascular:     Rate and Rhythm: Normal rate and regular rhythm.     Pulses: Normal pulses.     Heart sounds: Normal heart sounds.  Pulmonary:     Effort: Pulmonary effort is normal.     Breath sounds: Normal breath sounds.  Chest:     Comments: Left breast s/p lumpectomy and radiation, no sign of local recurrence, right breast benign Abdominal:     General: Abdomen is flat. Bowel sounds are normal. There is no distension.     Palpations: Abdomen is soft.     Tenderness: There is no abdominal tenderness.  Musculoskeletal:        General: No swelling.     Cervical back: Neck supple.  Lymphadenopathy:     Cervical: No cervical adenopathy.  Skin:    General: Skin is warm and dry.     Findings: No rash.  Neurological:     General: No focal deficit present.     Mental Status: She is alert.  Psychiatric:        Mood and Affect: Mood normal.        Behavior: Behavior normal.     LABORATORY DATA: None for this visit    ASSESSMENT and THERAPY PLAN:   Breast cancer of upper-inner quadrant of left female breast ([place]) Lindsey Barrett is a 77 year old woman with history of stage Ia ER/PR positive breast cancer diagnosed in January 2017 status post left lumpectomy followed by adjuvant radiation and antiestrogen therapy with aromatase inhibitors which began in 2017.  Lindsey Barrett has no clinical or radiographic sign of breast cancer recurrence.  We discussed her breast tenderness in detail.  I reviewed with her wound because of breast tenderness can be caffeine and dietary intake.  I offered to refer her to physical therapy as well.  For now she is going to look at her dietary  intake and if she decides to go to PT, if the pain is persistent despite changes, or if she develops new symptoms she will let us know.  We will see Lindsey Barrett back in 09/2021 for her routine f/u visit with Dr. Lindi Adie, she knows to reach out sooner if needed.     All questions were answered. The patient knows to call the clinic with any problems, questions or concerns. We can certainly see the patient much sooner if necessary.  Total encounter time:20 minutes*in face-to-face visit time, chart review, lab review, care coordination, order entry, and documentation of the encounter time.    Wilber Bihari, NP 05/28/22 9:10 AM Medical Oncology and Hematology [facility] [place], [place] [address] Tel. [phone]    Fax. [phone]  *Total Encounter Time as defined by the Centers for Medicare and Medicaid Services includes, in addition to the face-to-face time of a patient visit (documented in the note above) non-face-to-face time: obtaining and reviewing outside history, ordering and reviewing medications, tests or procedures, care coordination (communications with other health care professionals or caregivers) and documentation in the medical record.

## 2022-05-24 ENCOUNTER — Ambulatory Visit (INDEPENDENT_AMBULATORY_CARE_PROVIDER_SITE_OTHER): Payer: Medicare Other

## 2022-05-24 ENCOUNTER — Ambulatory Visit (INDEPENDENT_AMBULATORY_CARE_PROVIDER_SITE_OTHER): Payer: Medicare Other | Admitting: Family Medicine

## 2022-05-24 VITALS — BP 118/70 | HR 74 | Ht 64.0 in | Wt 165.0 lb

## 2022-05-24 DIAGNOSIS — M5030 Other cervical disc degeneration, unspecified cervical region: Secondary | ICD-10-CM | POA: Diagnosis not present

## 2022-05-24 DIAGNOSIS — M5136 Other intervertebral disc degeneration, lumbar region: Secondary | ICD-10-CM | POA: Diagnosis not present

## 2022-05-24 DIAGNOSIS — M5412 Radiculopathy, cervical region: Secondary | ICD-10-CM

## 2022-05-24 DIAGNOSIS — M542 Cervicalgia: Secondary | ICD-10-CM

## 2022-05-24 DIAGNOSIS — M50322 Other cervical disc degeneration at C5-C6 level: Secondary | ICD-10-CM | POA: Diagnosis not present

## 2022-05-24 DIAGNOSIS — M545 Low back pain, unspecified: Secondary | ICD-10-CM | POA: Diagnosis not present

## 2022-05-24 MED ORDER — GABAPENTIN 100 MG PO CAPS: 100.0000 mg | ORAL_CAPSULE | Freq: Every day | ORAL | 3 refills | Status: DC

## 2022-05-24 NOTE — Assessment & Plan Note (Addendum)
Patient does have degenerative disc disease.  Discussed icing regimen and home exercises, which activities to do and which ones to avoid.  Increase activity slowly over the course the next 4 weeks.  Follow-up with me again in 6 to 8 weeks do feel physical therapy will be helpful.  Will get x-rays to further evaluate how much progression has been especially with patient having history of breast cancer.  Follow-up with me again in 6 to 8 weeks

## 2022-05-24 NOTE — Patient Instructions (Addendum)
Good to see you Get x rays today  [facility] cervical rad Gabapentin 100 mg nightly Follow up in 2-3 months

## 2022-05-24 NOTE — Assessment & Plan Note (Signed)
Likely degenerative disc disease.  Discussed posture and ergonomics as well.  Will work with formal physical therapy.  Follow-up with me again in 6 to 8 weeks.  Medications include gabapentin 100 mg at night.

## 2022-05-25 NOTE — Progress Notes (Signed)
Normal cognitive status assessed by direct observation by this Nurse Health Advisor. No abnormalities found.

## 2022-05-28 NOTE — Assessment & Plan Note (Signed)
Lindsey Barrett is a 77 year old woman with history of stage Ia ER/PR positive breast cancer diagnosed in January 2017 status post left lumpectomy followed by adjuvant radiation and antiestrogen therapy with aromatase inhibitors which began in 2017.  [place] has no clinical or radiographic sign of breast cancer recurrence.  We discussed her breast tenderness in detail.  I reviewed with her wound because of breast tenderness can be caffeine and dietary intake.  I offered to refer her to physical therapy as well.  For now she is going to look at her dietary intake and if she decides to go to PT, if the pain is persistent despite changes, or if she develops new symptoms she will let us know.  We will see Albertia back in 09/2021 for her routine f/u visit with Dr. Lindi Adie, she knows to reach out sooner if needed.

## 2022-05-31 ENCOUNTER — Encounter: Payer: Self-pay | Admitting: Internal Medicine

## 2022-05-31 ENCOUNTER — Encounter: Payer: Self-pay | Admitting: Family Medicine

## 2022-05-31 NOTE — Progress Notes (Signed)
Outside notes received. Information abstracted. Notes sent to scan. 

## 2022-06-14 ENCOUNTER — Encounter: Payer: Self-pay | Admitting: Physical Therapy

## 2022-06-14 ENCOUNTER — Ambulatory Visit: Payer: Medicare Other | Attending: Family Medicine | Admitting: Physical Therapy

## 2022-06-14 DIAGNOSIS — M5412 Radiculopathy, cervical region: Secondary | ICD-10-CM | POA: Diagnosis not present

## 2022-06-14 DIAGNOSIS — R252 Cramp and spasm: Secondary | ICD-10-CM | POA: Insufficient documentation

## 2022-06-14 NOTE — Therapy (Signed)
OUTPATIENT PHYSICAL THERAPY CERVICAL EVALUATION   Patient Name: Lindsey Barrett MRN: [id number] DOB:1946-02-28, 77 y.o., female Today's Date: 06/14/2022  END OF SESSION:   Past Medical History:  Diagnosis Date   Anxiety    Atrophic vaginitis    Breast cancer ([place])    Breast cancer of upper-inner quadrant of left female breast ([place]) 05/20/2015   Breast cyst    x3   DDD (degenerative disc disease)    Diverticulosis    Fasting hyperglycemia    GERD (gastroesophageal reflux disease)    [name] syndrome    Glaucoma    History of hiatal hernia    HSV infection    Hyperlipidemia    Radiation    left breast 50.4 gray   STD (sexually transmitted disease)    HSV   Past Surgical History:  Procedure Laterality Date   APPENDECTOMY  1961   BREAST CYST ASPIRATION     x2 right ,x1 left   COLONOSCOPY  2007   neg   DILATION AND CURETTAGE OF UTERUS  1975   FLEXIBLE SIGMOIDOSCOPY     x2   PELVIC LAPAROSCOPY     RADIOACTIVE SEED GUIDED PARTIAL MASTECTOMY WITH AXILLARY SENTINEL LYMPH NODE BIOPSY Left 05/31/2015   Procedure: LEFT BREAST RADIOACTIVE SEED GUIDED LUMPECTOMY WITH LEFT AXILLARY SENTINEL LYMPH NODE BIOPSY;  Surgeon: [name], MD;  Location: [facility];  Service: General;  Laterality: Left;   RE-EXCISION OF BREAST LUMPECTOMY Left 07/01/2015   Procedure: RE-EXCISION OF LEFT BREAST INFERIOR MARGIN;  Surgeon: [name], MD;  Location: [facility];  Service: General;  Laterality: Left;   UPPER GASTROINTESTINAL ENDOSCOPY     UPPER GI ENDOSCOPY  10/2010   Dr [name] TOOTH EXTRACTION  1978   Patient Active Problem List   Diagnosis Date Noted   DDD (degenerative disc disease), cervical 05/24/2022   Nevus of toe of right foot 03/27/2022   Venous insufficiency of both lower extremities 03/27/2022   Pain of left calf 03/27/2022   Aortic atherosclerosis ([place]) 03/27/2022   Dysuria 10/26/2021   Arthritis of carpometacarpal ([place])  joint of left thumb 10/18/2021   Rash 08/18/2021   Light headedness 03/29/2021   Dermatitis 12/09/2020   Low back pain 10/18/2020   COVID-19 08/31/2020   Patellofemoral arthritis of right knee 08/25/2020   Left wrist pain 04/19/2020   Left leg pain 04/18/2020   Acute pain of right knee 03/09/2020   Sensorineural hearing loss (SNHL) of both ears 03/20/2019   Deviated nasal septum 03/02/2019   Pulmonary nodule 07/29/2018   Dupuytren's contracture of both hands 07/11/2018   Ganglion cyst of volar aspect of left wrist 06/04/2018   Frozen shoulder 03/18/2018   Right rotator cuff tear 10/15/2017   Tinnitus of both ears 01/01/2017   Reaction to internal stress 05/29/2016   Prediabetes 08/10/2015   Breast cancer of upper-inner quadrant of left female breast ([place]) 05/20/2015   Open-angle glaucoma 06/17/2014   Vitamin D deficiency 10/18/2011   HSV infection    Atrophic vaginitis    VARICOSE VEINS, LOWER EXTREMITIES 09/28/2009   GERD 07/14/2009   Hyperlipidemia 12/26/2006   GILBERT'S SYNDROME 08/09/2006   DEGENERATIVE [place] DISEASE 08/09/2006    PCP: Quay Burow, MD  REFERRING PROVIDER: Creig Hines, MD  REFERRING DIAG: cervical radiculopathy  THERAPY DIAG:  No diagnosis found.  Rationale for Evaluation and Treatment: Rehabilitation  ONSET DATE: 05/24/22  SUBJECTIVE:  SUBJECTIVE STATEMENT: Patient reports that she has had some right arm numbness and tingling with putting make up on and she reports a strain in her neck, reports that her right middle finger is very numb when she wakes up in the morning, reports that it takes 4-5 minutes for it to fade away  PERTINENT HISTORY:  DDD  PAIN:  Are you having pain? Yes: NPRS scale: 0/10 Pain location: neck, right arm Pain description: mostly  numbness and tingling in the right arm, and very tight in the neck Aggravating factors: putting on make up, sleep Relieving factors: once I get moving it gets better  PRECAUTIONS: None  WEIGHT BEARING RESTRICTIONS: No  FALLS:  Has patient fallen in last 6 months? No  LIVING ENVIRONMENT: Lives with: lives alone Lives in: House/apartment Stairs: No Has following equipment at home: None  OCCUPATION: retired  PLOF: Independent  PATIENT GOALS: have less pain and numbness  NEXT MD VISIT: 1 month  OBJECTIVE:   DIAGNOSTIC FINDINGS:  DDD  COGNITION: Overall cognitive status: Within functional limits for tasks assessed  SENSATION: WFL  POSTURE: rounded shoulders and forward head  PALPATION: Very tight and tender in the right upper trap the rhomboids and the cervical area   CERVICAL ROM:   Active ROM A/PROM (deg) eval  Flexion Decreased 25%  Extension 50%  Right lateral flexion 50%  Left lateral flexion 50%  Right rotation 50%  Left rotation 50%   (Blank rows = not tested)  UPPER EXTREMITY ROM:  WNL's  UPPER EXTREMITY MMT: 4+/5 mild pain   CERVICAL SPECIAL TESTS:  Spurling's test: Positive  TODAY'S TREATMENT:                                                                                                                              DATE:    PATIENT EDUCATION:  Education details: POC/HEP Person educated: Patient Education method: Explanation, Demonstration, Corporate treasurer cues, Verbal cues, and Handouts Education comprehension: verbalized understanding  HOME EXERCISE PROGRAM: Access Code: [email or web address] URL: [email or web address] Date: 06/14/2022 Prepared by: Lum Babe  Exercises - Seated Upper Trap Stretch  - 1 x daily - 7 x weekly - 1 sets - 5 reps - 10 hold - Gentle Levator Scapulae Stretch  - 1 x daily - 7 x weekly - 1 sets - 5 reps - 10 hold - Thoracic Extension Mobilization on Foam Roll  - 1 x daily - 7 x weekly - 1 sets - 2 reps - 30  hold  ASSESSMENT:  CLINICAL IMPRESSION: Patient is a 77 y.o. female who was seen today for physical therapy evaluation and treatment for cervical radiculopathy.  Pain mostly in the tightness in the necka nd numbness and tingling in the right arm and in the right middle finger, x-rays show DDD.  She has some limited cervical ROM, shoulder motion and strength are good.  Fwd head and rounded shoulder posture   OBJECTIVE IMPAIRMENTS: cardiopulmonary status limiting activity, decreased activity  tolerance, decreased endurance, decreased mobility, difficulty walking, decreased ROM, decreased strength, increased muscle spasms, impaired flexibility, improper body mechanics, postural dysfunction, and pain.  REHAB POTENTIAL: Good  CLINICAL DECISION MAKING: Stable/uncomplicated  EVALUATION COMPLEXITY: Low   GOALS: Goals reviewed with patient? Yes  SHORT TERM GOALS: Target date: 06/28/22  Independent with initial HEP Goal status: INITIAL  LONG TERM GOALS: Target date: 09/12/22  Independent with advanced HEP or gym Goal status: INITIAL  2.  Understand posture and body mechanics Goal status: INITIAL  3.  Decrease pain/numbness 50% Goal status: INITIAL  4.  Increase cervical ROM 25% Goal status: INITIAL PLAN:  PT FREQUENCY: 1-2x/week  PT DURATION: 12 weeks  PLANNED INTERVENTIONS: Therapeutic exercises, Therapeutic activity, Neuromuscular re-education, Balance training, Gait training, Patient/Family education, Self Care, Joint mobilization, Dry Needling, Electrical stimulation, Spinal mobilization, Cryotherapy, Moist heat, Traction, Ultrasound, and Manual therapy  PLAN FOR NEXT SESSION: STM to the tight mms, manual cervical traction and occipital release   Sydnee Lamour W, PT 06/14/2022, 7:45 AM

## 2022-06-15 ENCOUNTER — Other Ambulatory Visit (HOSPITAL_BASED_OUTPATIENT_CLINIC_OR_DEPARTMENT_OTHER): Payer: Self-pay

## 2022-06-20 ENCOUNTER — Other Ambulatory Visit: Payer: Self-pay | Admitting: Cardiovascular Disease

## 2022-06-27 ENCOUNTER — Ambulatory Visit: Payer: Medicare Other | Attending: Cardiology | Admitting: Pharmacist Clinician (PhC)/ Clinical Pharmacy Specialist

## 2022-06-27 ENCOUNTER — Telehealth: Payer: Medicare Other

## 2022-06-27 DIAGNOSIS — E782 Mixed hyperlipidemia: Secondary | ICD-10-CM | POA: Diagnosis not present

## 2022-06-27 NOTE — Patient Instructions (Addendum)
Your Results:             Your most recent labs Goal  Total Cholesterol 179 < 200  Triglycerides 119 < 150  HDL (happy/good cholesterol) 57.1 > 40  LDL (lousy/bad cholesterol 98 < 70   Medication changes:  Continue with rosuvastatin 40 mg and ezetimibe 10 mg once daily  Lab orders:  We want to repeat labs after 3 months (cholesterol and Lp(a)).    We will send you a lab order to remind you once we get closer to that time.     Thank you for choosing [facility]

## 2022-06-27 NOTE — Progress Notes (Signed)
Office Visit    Patient Name: Lindsey Barrett Date of Encounter: 07/05/2022  Primary Care Provider:  [name], MD Primary Cardiologist:  Shelva Majestic, MD  Chief Complaint    Hyperlipidemia   Significant Past Medical History   Aortic atherosclerosis Seen on chest CT  Gilbert's syndrome (Liver doesn't process bilirubin properly), total bilirubin elevated at 2.0 (05/2022)  Pre-DM 1/24 A1c 6.4  Vit D deficiency On supplementation, level WNL currently     Allergies  Allergen Reactions   Arimidex [Anastrozole] Other (See Comments)    Unable to function, loopy, dizzy, and mentally fogginess   Latex Rash and Other (See Comments)    Gloves, Band-Aids    Septra Ds [Sulfamethoxazole-Trimethoprim] Hives   Epinephrine Other (See Comments)   Sulfa Antibiotics Hives   Sulfur Other (See Comments)   Adhesive [Tape] Rash   Augmentin [Amoxicillin-Pot Clavulanate] Diarrhea    History of Present Illness    Lindsey Barrett is a 77 y.o. female patient of Dr Claiborne Billings,  Insurance Carrier:  [employer] Medicare Rx preferred; Plan [phone]  LDL Cholesterol goal:  LDL < 100 (< 70 if A1c increases)  Current Medications:   rosuvastatin 40 mg qd, ezetimibe 10 mg qd  Family Hx:   father had CABG, mother had Aortic aneurysm   Accessory Clinical Findings   Lab Results  Component Value Date   CHOL 179 05/18/2022   HDL 57.10 05/18/2022   LDLCALC 98 05/18/2022   LDLDIRECT 148.9 09/23/2009   TRIG 119.0 05/18/2022   CHOLHDL 3 05/18/2022    Lab Results  Component Value Date   ALT 18 05/18/2022   AST 23 05/18/2022   ALKPHOS 54 05/18/2022   BILITOT 2.0 (H) 05/18/2022   Lab Results  Component Value Date   CREATININE 0.68 05/18/2022   BUN 12 05/18/2022   NA 141 05/18/2022   K 4.0 05/18/2022   CL 104 05/18/2022   CO2 31 05/18/2022   Lab Results  Component Value Date   HGBA1C 6.4 05/18/2022    Home Medications    Current Outpatient Medications  Medication Sig Dispense  Refill   aspirin EC 81 MG tablet Take 1 tablet (81 mg total) by mouth daily.     bimatoprost (LUMIGAN) 0.01 % SOLN Place 1 drop into both eyes at bedtime.     brinzolamide (AZOPT) 1 % ophthalmic suspension 1 drop 2 (two) times daily.     calcium carbonate (TUMS - DOSED IN MG ELEMENTAL CALCIUM) 500 MG chewable tablet Chew 1 tablet by mouth as needed for indigestion or heartburn (1000 mg).      Carboxymethylcellulose Sodium (REFRESH LIQUIGEL OP) Apply 1 drop to eye 4 (four) times daily as needed.     Cholecalciferol (VITAMIN D3 PO) Take 5,000 Int'l Units by mouth daily.      [employer] SUNSCREEN SPF 30 EX apply topically to face and body daily for 30     ezetimibe (ZETIA) 10 MG tablet Take 1 tablet by mouth once daily 90 tablet 0   famotidine (PEPCID) 10 MG tablet Take 10 mg by mouth daily.     gabapentin (NEURONTIN) 100 MG capsule Take 1 capsule (100 mg total) by mouth at bedtime. 30 capsule 3   ibuprofen (ADVIL) 200 MG tablet Take 200 mg by mouth every 6 (six) hours as needed.     letrozole (FEMARA) 2.5 MG tablet Take 1 tablet (2.5 mg total) by mouth daily. *TEVA Brand Only 90 tablet 3   rosuvastatin (CRESTOR) 40 MG  tablet Take 1 tablet by mouth once daily 90 tablet 0   rosuvastatin (CRESTOR) 40 MG tablet Take 1 tablet (40 mg total) by mouth daily. 90 tablet 3   sodium chloride (MURO 128) 5 % ophthalmic solution Place 1 drop into both eyes 2 (two) times a day. Before Azopt     Vaginal Lubricant (REPLENS) GEL Place 2 application  vaginally 2 (two) times a week.     No current facility-administered medications for this visit.     Assessment & Plan    Hyperlipidemia Assessment: Patient with ASCVD at LDL goal of < 100 Most recent LDL 98 on 182024 Has been compliant with high intensity statin/ezetimibe : rosuvastatin 40 and ezetimibe 10 Reviewed options for lowering LDL cholesterol, including PCSK-9 inhibitors, bempedoic acid and inclisiran.  Discussed mechanisms of action, dosing, side effects,  potential decreases in LDL cholesterol and costs.  Also reviewed potential options for patient assistance. Patient understands the need for lower LDL should A1c to to 6.5 or coronary disease becomes quantifiable.  Plan: Patient would like to continue with current rosuvastatin/ezetimibe combination. Repeat labs after:  3 months Lipid Liver function   Tommy Medal, PharmD CPP [facility] [address] [place]  [place], [place] [address] [phone]  07/05/2022, 7:07 AM

## 2022-06-30 ENCOUNTER — Other Ambulatory Visit (HOSPITAL_BASED_OUTPATIENT_CLINIC_OR_DEPARTMENT_OTHER): Payer: Self-pay

## 2022-06-30 ENCOUNTER — Other Ambulatory Visit: Payer: Self-pay | Admitting: Internal Medicine

## 2022-07-02 ENCOUNTER — Ambulatory Visit: Payer: Medicare Other | Admitting: Physical Therapy

## 2022-07-02 ENCOUNTER — Other Ambulatory Visit (HOSPITAL_BASED_OUTPATIENT_CLINIC_OR_DEPARTMENT_OTHER): Payer: Self-pay

## 2022-07-02 MED ORDER — ROSUVASTATIN CALCIUM 40 MG PO TABS
40.0000 mg | ORAL_TABLET | Freq: Every day | ORAL | 3 refills | Status: DC
  Filled 2022-07-02 (×3): qty 90, 90d supply, fill #0

## 2022-07-03 ENCOUNTER — Other Ambulatory Visit (HOSPITAL_BASED_OUTPATIENT_CLINIC_OR_DEPARTMENT_OTHER): Payer: Self-pay

## 2022-07-05 ENCOUNTER — Encounter: Payer: Self-pay | Admitting: Pharmacist Clinician (PhC)/ Clinical Pharmacy Specialist

## 2022-07-05 NOTE — Assessment & Plan Note (Signed)
Assessment: Patient with ASCVD at LDL goal of < 100 Most recent LDL 98 on 182024 Has been compliant with high intensity statin/ezetimibe : rosuvastatin 40 and ezetimibe 10 Reviewed options for lowering LDL cholesterol, including PCSK-9 inhibitors, bempedoic acid and inclisiran.  Discussed mechanisms of action, dosing, side effects, potential decreases in LDL cholesterol and costs.  Also reviewed potential options for patient assistance. Patient understands the need for lower LDL should A1c to to 6.5 or coronary disease becomes quantifiable.  Plan: Patient would like to continue with current rosuvastatin/ezetimibe combination. Repeat labs after:  3 months Lipid Liver function

## 2022-07-05 NOTE — Progress Notes (Signed)
Benito Mccreedy D.[place] [address] [place] Phone: [phone]   Assessment and Plan:     1. Right foot pain - Acute, initial sports medicine visit - Unclear etiology of right foot pain, though could be from footwear putting excess pressure over distal first metatarsal versus patient thinking she may have dropped a can on the foot causing bony contusion - X-ray obtained in clinic.  My interpretation: No acute fracture or dislocation.  Arthritic changes most prominent at navicular that does not correspond to patient's area of TTP - Recommend using topical Voltaren gel as needed, Tylenol or NSAIDs as needed for day-to-day pain relief - Recommend using comfortable and cushioned footwear to prevent recurrent flare for the next 2 weeks - DG Foot Complete Right; Future    Pertinent previous records reviewed include none   Follow Up: As needed   Subjective:   I, Pincus Badder, am serving as a Education administrator for Doctor Glennon Mac  Chief Complaint: right foot pain   HPI:   07/06/2022 Patient is a 77 year old female complaining of right foot pain. Patient states Sunday or Monday night she was rubbing her feet together and noticed the top of the foot was really sore , waited a couple of days and the pain didn't go away , she's states it was swollen she iced and the swelling went down , vaguely remembers dropping a can on her foot, pain on the great toe     Relevant Historical Information:  Venous insufficiency, GERD, Gaubert syndrome, history of breast cancer, prediabetes  Additional pertinent review of systems negative.   Current Outpatient Medications:    aspirin EC 81 MG tablet, Take 1 tablet (81 mg total) by mouth daily., Disp:  , Rfl:    bimatoprost (LUMIGAN) 0.01 % SOLN, Place 1 drop into both eyes at bedtime., Disp: , Rfl:    brinzolamide (AZOPT) 1 % ophthalmic suspension, 1 drop 2 (two) times daily., Disp: , Rfl:     calcium carbonate (TUMS - DOSED IN MG ELEMENTAL CALCIUM) 500 MG chewable tablet, Chew 1 tablet by mouth as needed for indigestion or heartburn (1000 mg). , Disp: , Rfl:    Carboxymethylcellulose Sodium (REFRESH LIQUIGEL OP), Apply 1 drop to eye 4 (four) times daily as needed., Disp: , Rfl:    Cholecalciferol (VITAMIN D3 PO), Take 5,000 Int'l Units by mouth daily. , Disp: , Rfl:    [employer]S SUNSCREEN SPF 30 EX, apply topically to face and body daily for 30, Disp: , Rfl:    ezetimibe (ZETIA) 10 MG tablet, Take 1 tablet by mouth once daily, Disp: 90 tablet, Rfl: 0   famotidine (PEPCID) 10 MG tablet, Take 10 mg by mouth daily., Disp: , Rfl:    ibuprofen (ADVIL) 200 MG tablet, Take 200 mg by mouth every 6 (six) hours as needed., Disp: , Rfl:    letrozole (FEMARA) 2.5 MG tablet, Take 1 tablet (2.5 mg total) by mouth daily. *TEVA Brand Only, Disp: 90 tablet, Rfl: 3   rosuvastatin (CRESTOR) 40 MG tablet, Take 1 tablet by mouth once daily, Disp: 90 tablet, Rfl: 0   sodium chloride (MURO 128) 5 % ophthalmic solution, Place 1 drop into both eyes 2 (two) times a day. Before Azopt, Disp: , Rfl:    Vaginal Lubricant (REPLENS) GEL, Place 2 application  vaginally 2 (two) times a week., Disp: , Rfl:    gabapentin (NEURONTIN) 100 MG capsule, Take 1 capsule (100 mg total)  by mouth at bedtime., Disp: 30 capsule, Rfl: 3   rosuvastatin (CRESTOR) 40 MG tablet, Take 1 tablet (40 mg total) by mouth daily., Disp: 90 tablet, Rfl: 3   Objective:     Vitals:   07/06/22 1046  BP: 118/80  Pulse: 72  SpO2: 97%  Weight: 165 lb (74.8 kg)  Height: '5\' 4"'$  (1.626 m)      Body mass index is 28.32 kg/m.    Physical Exam:    Gen: Appears well, nad, nontoxic and pleasant Psych: Alert and oriented, appropriate mood and affect Neuro: sensation intact, strength is 5/5 with df/pf/inv/ev, muscle tone wnl Skin: no susupicious lesions or rashes  Right foot/ankle:  No deformity, no swelling or effusion TTP dorsal surface of distal  first metatarsal No pain with flexion or extension of first MTP NTTP over fibular head, lat mal, medial mal, achilles, navicular, base of 5th, ATFL, CFL, deltoid, calcaneous or midfoot ROM DF 30, PF 45, inv/ev intact Negative ant drawer, talar tilt, rotation test, squeeze test. Neg thompson No pain with resisted inversion or eversion    Electronically signed by:  Benito Mccreedy D.[facility] 11:58 AM 07/06/22

## 2022-07-06 ENCOUNTER — Ambulatory Visit (INDEPENDENT_AMBULATORY_CARE_PROVIDER_SITE_OTHER): Payer: Medicare Other | Admitting: Sports Medicine

## 2022-07-06 ENCOUNTER — Ambulatory Visit (INDEPENDENT_AMBULATORY_CARE_PROVIDER_SITE_OTHER): Payer: Medicare Other

## 2022-07-06 VITALS — BP 118/80 | HR 72 | Ht 64.0 in | Wt 165.0 lb

## 2022-07-06 DIAGNOSIS — M79671 Pain in right foot: Secondary | ICD-10-CM | POA: Diagnosis not present

## 2022-07-06 NOTE — Patient Instructions (Addendum)
Good to see you Tylenol [phone] mg 2-3 times a day for pain relief  Voltaren gel topically over areas of pain  Wear comfortable shoes for 1-2 weeks  As needed follow up

## 2022-07-18 ENCOUNTER — Encounter: Payer: Self-pay | Admitting: Physical Therapy

## 2022-07-18 ENCOUNTER — Ambulatory Visit: Payer: Medicare Other | Attending: Family Medicine | Admitting: Physical Therapy

## 2022-07-18 DIAGNOSIS — H401131 Primary open-angle glaucoma, bilateral, mild stage: Secondary | ICD-10-CM | POA: Diagnosis not present

## 2022-07-18 DIAGNOSIS — Z853 Personal history of malignant neoplasm of breast: Secondary | ICD-10-CM | POA: Diagnosis not present

## 2022-07-18 DIAGNOSIS — R252 Cramp and spasm: Secondary | ICD-10-CM | POA: Diagnosis not present

## 2022-07-18 DIAGNOSIS — M5412 Radiculopathy, cervical region: Secondary | ICD-10-CM | POA: Insufficient documentation

## 2022-07-18 DIAGNOSIS — M545 Low back pain, unspecified: Secondary | ICD-10-CM | POA: Diagnosis not present

## 2022-07-18 DIAGNOSIS — H35371 Puckering of macula, right eye: Secondary | ICD-10-CM | POA: Diagnosis not present

## 2022-07-18 DIAGNOSIS — M6281 Muscle weakness (generalized): Secondary | ICD-10-CM | POA: Diagnosis not present

## 2022-07-18 DIAGNOSIS — H04123 Dry eye syndrome of bilateral lacrimal glands: Secondary | ICD-10-CM | POA: Diagnosis not present

## 2022-07-18 DIAGNOSIS — H25813 Combined forms of age-related cataract, bilateral: Secondary | ICD-10-CM | POA: Diagnosis not present

## 2022-07-18 NOTE — Therapy (Signed)
OUTPATIENT PHYSICAL THERAPY CERVICAL EVALUATION   Patient Name: Lindsey Barrett MRN: [id number] DOB:[birth year], 77 y.o., female Today's Date: 07/18/2022  END OF SESSION:  PT End of Session - 07/18/22 1542     Visit Number 2    Date for PT Re-Evaluation 09/12/22    Authorization Type Medicare    PT Start Time 1530    PT Stop Time 1610    PT Time Calculation (min) 40 min    Activity Tolerance Patient tolerated treatment well    Behavior During Therapy [facility] for tasks assessed/performed             Past Medical History:  Diagnosis Date   Anxiety    Atrophic vaginitis    Breast cancer ([place])    Breast cancer of upper-inner quadrant of left female breast ([place]) 05/20/2015   Breast cyst    x3   DDD (degenerative disc disease)    Diverticulosis    Fasting hyperglycemia    GERD (gastroesophageal reflux disease)    [name] syndrome    Glaucoma    History of hiatal hernia    HSV infection    Hyperlipidemia    Radiation    left breast 50.4 gray   STD (sexually transmitted disease)    HSV   Past Surgical History:  Procedure Laterality Date   APPENDECTOMY  1961   BREAST CYST ASPIRATION     x2 right ,x1 left   COLONOSCOPY  2007   neg   DILATION AND CURETTAGE OF UTERUS  1975   FLEXIBLE SIGMOIDOSCOPY     x2   PELVIC LAPAROSCOPY     RADIOACTIVE SEED GUIDED PARTIAL MASTECTOMY WITH AXILLARY SENTINEL LYMPH NODE BIOPSY Left 05/31/2015   Procedure: LEFT BREAST RADIOACTIVE SEED GUIDED LUMPECTOMY WITH LEFT AXILLARY SENTINEL LYMPH NODE BIOPSY;  Surgeon: [name], MD;  Location: [place];  Service: General;  Laterality: Left;   RE-EXCISION OF BREAST LUMPECTOMY Left 07/01/2015   Procedure: RE-EXCISION OF LEFT BREAST INFERIOR MARGIN;  Surgeon: [name], MD;  Location: [place];  Service: General;  Laterality: Left;   UPPER GASTROINTESTINAL ENDOSCOPY     UPPER GI ENDOSCOPY  10/2010   Dr [name] TOOTH EXTRACTION  1978    Patient Active Problem List   Diagnosis Date Noted   DDD (degenerative disc disease), cervical 05/24/2022   Nevus of toe of right foot 03/27/2022   Venous insufficiency of both lower extremities 03/27/2022   Pain of left calf 03/27/2022   Aortic atherosclerosis ([place]) 03/27/2022   Dysuria 10/26/2021   Arthritis of carpometacarpal ([place]) joint of left thumb 10/18/2021   Rash 08/18/2021   Light headedness 03/29/2021   Dermatitis 12/09/2020   Low back pain 10/18/2020   COVID-19 08/31/2020   Patellofemoral arthritis of right knee 08/25/2020   Left wrist pain 04/19/2020   Left leg pain 04/18/2020   Acute pain of right knee 03/09/2020   Sensorineural hearing loss (SNHL) of both ears 03/20/2019   Deviated nasal septum 03/02/2019   Pulmonary nodule 07/29/2018   Dupuytren's contracture of both hands 07/11/2018   Ganglion cyst of volar aspect of left wrist 06/04/2018   Frozen shoulder 03/18/2018   Right rotator cuff tear 10/15/2017   Tinnitus of both ears 01/01/2017   Reaction to internal stress 05/29/2016   Prediabetes 08/10/2015   Breast cancer of upper-inner quadrant of left female breast ([place]) 05/20/2015   Open-angle glaucoma 06/17/2014   Vitamin D deficiency 10/18/2011   HSV infection  Atrophic vaginitis    VARICOSE VEINS, LOWER EXTREMITIES 09/28/2009   GERD 07/14/2009   Hyperlipidemia 12/26/2006   GILBERT'S SYNDROME 08/09/2006   DEGENERATIVE Nolan DISEASE 08/09/2006    PCP: Quay Burow, MD  REFERRING PROVIDER: Creig Hines, MD  REFERRING DIAG: cervical radiculopathy  THERAPY DIAG:  Radiculopathy, cervical region  Cramp and spasm  Muscle weakness (generalized)  Acute bilateral low back pain without sciatica  Rationale for Evaluation and Treatment: Rehabilitation  ONSET DATE: 05/24/22  SUBJECTIVE:                                                                                                                                                                                                          SUBJECTIVE STATEMENT: Patient reports that she has been doing well, no recent issues ,feels that she changes her posture and it reallyhelps  PERTINENT HISTORY:  DDD  PAIN:  Are you having pain? Yes: NPRS scale: 0/10 Pain location: neck, right arm Pain description: mostly numbness and tingling in the right arm, and very tight in the neck Aggravating factors: putting on make up, sleep Relieving factors: once I get moving it gets better  PRECAUTIONS: None  WEIGHT BEARING RESTRICTIONS: No  FALLS:  Has patient fallen in last 6 months? No  LIVING ENVIRONMENT: Lives with: lives alone Lives in: House/apartment Stairs: No Has following equipment at home: None  OCCUPATION: retired  PLOF: Independent  PATIENT GOALS: have less pain and numbness  NEXT MD VISIT: 1 month  OBJECTIVE:   DIAGNOSTIC FINDINGS:  DDD  COGNITION: Overall cognitive status: Within functional limits for tasks assessed  SENSATION: WFL  POSTURE: rounded shoulders and forward head  PALPATION: Very tight and tender in the right upper trap the rhomboids and the cervical area   CERVICAL ROM:   Active ROM A/PROM (deg) eval  Flexion Decreased 25%  Extension 50%  Right lateral flexion 50%  Left lateral flexion 50%  Right rotation 50%  Left rotation 50%   (Blank rows = not tested)  UPPER EXTREMITY ROM:  WNL's  UPPER EXTREMITY MMT: 4+/5 mild pain   CERVICAL SPECIAL TESTS:  Spurling's test: Positive  TODAY'S TREATMENT:  DATE:   07/18/22 Reviewed HEP and performed, verbal and tactile cues Reviewed posture and body mechanics Gait around the back building brisk pace STM to the neck and upper traps and into the rhomboids  PATIENT EDUCATION:  Education details: POC/HEP Person educated: Patient Education method: Explanation, Demonstration, Tactile  cues, Verbal cues, and Handouts Education comprehension: verbalized understanding  HOME EXERCISE PROGRAM: Access Code: [email or web address] URL: [email or web address] Date: 06/14/2022 Prepared by: Lum Babe  Exercises - Seated Upper Trap Stretch  - 1 x daily - 7 x weekly - 1 sets - 5 reps - 10 hold - Gentle Levator Scapulae Stretch  - 1 x daily - 7 x weekly - 1 sets - 5 reps - 10 hold - Thoracic Extension Mobilization on Foam Roll  - 1 x daily - 7 x weekly - 1 sets - 2 reps - 30 hold  ASSESSMENT:  CLINICAL IMPRESSION: Patient doing very well, feels that the HEP and her posture are helping greatly, reports that now when she gets the numbness she can change her posture and it will go away  OBJECTIVE IMPAIRMENTS: cardiopulmonary status limiting activity, decreased activity tolerance, decreased endurance, decreased mobility, difficulty walking, decreased ROM, decreased strength, increased muscle spasms, impaired flexibility, improper body mechanics, postural dysfunction, and pain.  REHAB POTENTIAL: Good  CLINICAL DECISION MAKING: Stable/uncomplicated  EVALUATION COMPLEXITY: Low   GOALS: Goals reviewed with patient? Yes  SHORT TERM GOALS: Target date: 06/28/22  Independent with initial HEP Goal status: met  LONG TERM GOALS: Target date: 09/12/22  Independent with advanced HEP or gym Goal status: met  2.  Understand posture and body mechanics Goal status: met  3.  Decrease pain/numbness 50% Goal status: met  4.  Increase cervical ROM 25% Goal status: met PLAN:  PT FREQUENCY: 1-2x/week  PT DURATION: 12 weeks  PLANNED INTERVENTIONS: Therapeutic exercises, Therapeutic activity, Neuromuscular re-education, Balance training, Gait training, Patient/Family education, Self Care, Joint mobilization, Dry Needling, Electrical stimulation, Spinal mobilization, Cryotherapy, Moist heat, Traction, Ultrasound, and Manual therapy  PLAN FOR NEXT SESSION: D/C goals  met  Sumner Boast, PT 07/18/2022, 3:43 PM

## 2022-08-09 ENCOUNTER — Other Ambulatory Visit (HOSPITAL_BASED_OUTPATIENT_CLINIC_OR_DEPARTMENT_OTHER): Payer: Self-pay

## 2022-08-10 ENCOUNTER — Other Ambulatory Visit (HOSPITAL_BASED_OUTPATIENT_CLINIC_OR_DEPARTMENT_OTHER): Payer: Self-pay

## 2022-08-10 DIAGNOSIS — H43811 Vitreous degeneration, right eye: Secondary | ICD-10-CM | POA: Diagnosis not present

## 2022-08-10 DIAGNOSIS — H53453 Other localized visual field defect, bilateral: Secondary | ICD-10-CM | POA: Diagnosis not present

## 2022-08-10 DIAGNOSIS — H18593 Other hereditary corneal dystrophies, bilateral: Secondary | ICD-10-CM | POA: Diagnosis not present

## 2022-08-10 DIAGNOSIS — H40053 Ocular hypertension, bilateral: Secondary | ICD-10-CM | POA: Diagnosis not present

## 2022-08-13 ENCOUNTER — Other Ambulatory Visit (HOSPITAL_BASED_OUTPATIENT_CLINIC_OR_DEPARTMENT_OTHER): Payer: Self-pay

## 2022-08-14 ENCOUNTER — Other Ambulatory Visit (HOSPITAL_BASED_OUTPATIENT_CLINIC_OR_DEPARTMENT_OTHER): Payer: Self-pay

## 2022-08-15 ENCOUNTER — Other Ambulatory Visit (HOSPITAL_BASED_OUTPATIENT_CLINIC_OR_DEPARTMENT_OTHER): Payer: Self-pay

## 2022-08-15 NOTE — Progress Notes (Unsigned)
[facility] [address] [place] [address] Phone: [phone] Subjective:   Bruce Donath, am serving as a scribe for Dr. Antoine Primas.  I'm seeing this patient by the request  of:  Pincus Sanes, MD  CC: Multiple complaint follow-up  [id number]  05/24/2022 Likely degenerative disc disease.  Discussed posture and ergonomics as well.  Will work with formal physical therapy.  Follow-up with me again in 6 to 8 weeks.  Medications include gabapentin 100 mg at night.     Patient does have degenerative disc disease.  Discussed icing regimen and home exercises, which activities to do and which ones to avoid.  Increase activity slowly over the course the next 4 weeks.  Follow-up with me again in 6 to 8 weeks do feel physical therapy will be helpful.  Will get x-rays to further evaluate how much progression has been especially with patient having history of breast cancer.  Follow-up with me again in 6 to 8 weeks      Update 08/16/2022 Lindsey Barrett is a 77 y.o. female coming in with complaint of C, L spine and R foot pain. Saw Dr. Jean Rosenthal for foot in March. Patient states that she still has tingling in middle finger of her right hand. Stretching her neck alleviates this symptom. Patient has not had pain throughout her other bothersome joints. Had blood flow test and has been wearing compression socks since then. Patient notes pain in the ischial tuberosity on L side after she sits for too long. Will go away after she gets up for water or a bathroom break.       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   Breast cyst    x3   DDD (degenerative disc disease)    Diverticulosis    Fasting hyperglycemia    GERD (gastroesophageal reflux disease)    Sullivan Lone syndrome    Glaucoma    History of hiatal hernia    HSV  infection    Hyperlipidemia    Radiation    left breast 50.4 gray   STD (sexually transmitted disease)    HSV   Past Surgical History:  Procedure Laterality Date   APPENDECTOMY  1961   BREAST CYST ASPIRATION     x2 right ,x1 left   COLONOSCOPY  2007   neg   DILATION AND CURETTAGE OF UTERUS  1975   FLEXIBLE SIGMOIDOSCOPY     x2   PELVIC LAPAROSCOPY     RADIOACTIVE SEED GUIDED PARTIAL MASTECTOMY WITH AXILLARY SENTINEL LYMPH NODE BIOPSY Left 05/31/2015   Procedure: LEFT BREAST RADIOACTIVE SEED GUIDED LUMPECTOMY WITH LEFT AXILLARY SENTINEL LYMPH NODE BIOPSY;  Surgeon: Emelia Loron, MD;  Location: [facility];  Service: General;  Laterality: Left;   RE-EXCISION OF BREAST LUMPECTOMY Left 07/01/2015   Procedure: RE-EXCISION OF LEFT BREAST INFERIOR MARGIN;  Surgeon: Emelia Loron, MD;  Location: [facility];  Service: General;  Laterality: Left;   UPPER GASTROINTESTINAL ENDOSCOPY     UPPER GI ENDOSCOPY  10/2010   Dr Tori Milks TOOTH EXTRACTION  1978   Social History   Socioeconomic History   Marital status: Divorced    Spouse name: Not on file   Number of children: 0   Years of education: Not on file   Highest  education level: Not on file  Occupational History   Occupation: Loss adjuster, 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  Tobacco Use   Smoking status: Former    Types: Cigarettes    Quit date: 03/28/1999    Years since quitting: 23.4   Smokeless tobacco: Never   Tobacco comments:    smoked 1967-2000, up to 3/4 ppd  Vaping Use   Vaping Use: Never used  Substance and Sexual Activity   Alcohol use: Yes    Alcohol/week: 10.0 standard drinks of alcohol    Types: 10 Cans of beer per week   Drug use: No   Sexual activity: Not Currently    Birth control/protection: Post-menopausal    Comment: 1st intercourse 77 yo-More than 5 partners  Other Topics Concern   Not on file  Social History Narrative   Occupation:  Loss adjuster, charteredBusiness Manager for [employer]       Lives alone.      Regular exercise-no   Social Determinants of Health   Financial Resource Strain: Low Risk  (05/11/2022)   Overall Financial Resource Strain (CARDIA)    Difficulty of Paying Living Expenses: Not hard at all  Food Insecurity: No Food Insecurity (05/10/2022)   Hunger Vital Sign    Worried About Running Out of Food in the Last Year: Never true    Ran Out of Food in the Last Year: Never true  Transportation Needs: No Transportation Needs (05/10/2022)   PRAPARE - Administrator, Civil ServiceTransportation    Lack of Transportation (Medical): No    Lack of Transportation (Non-Medical): No  Physical Activity: Insufficiently Active (05/10/2022)   Exercise Vital Sign    Days of Exercise per Week: 2 days    Minutes of Exercise per Session: 20 min  Stress: No Stress Concern Present (05/11/2022)   [employer] - Occupational Stress Questionnaire    Feeling of Stress : Not at all  Social Connections: Moderately Integrated (05/11/2022)   Social Connection and Isolation Panel [NHANES]    Frequency of Communication with Friends and Family: More than three times a week    Frequency of Social Gatherings with Friends and Family: More than three times a week    Attends Religious Services: More than 4 times per year    Active Member of [employer]ubs or Organizations: Yes    Attends Engineer, structuralClub or Organization Meetings: More than 4 times per year    Marital Status: Divorced   Allergies  Allergen Reactions   Arimidex [Anastrozole] Other (See Comments)    Unable to function, loopy, dizzy, and mentally fogginess   Latex Rash and Other (See Comments)    Gloves, Band-Aids    Septra Ds [Sulfamethoxazole-Trimethoprim] Hives   Epinephrine Other (See Comments)   Sulfa Antibiotics Hives   Sulfur Other (See Comments)   Adhesive [Tape] Rash   Augmentin [Amoxicillin-Pot Clavulanate] Diarrhea   Family History  Problem Relation Age of Onset   Lung cancer Mother        smoker   Endometriosis Mother    Cancer  Mother        Bladder cancer   Stomach cancer Maternal Grandmother    Coronary artery disease Father    Cancer Father [phone]       intestinal carcinoid tumors of the ileum   Sudden death Sister        201 week old, congenital deformity   Liver cancer Maternal Uncle    Stroke Paternal Grandfather [id number]   Leukemia Maternal Uncle    Breast cancer Paternal Aunt  great paternal aunt-Age 75's   Colon cancer Paternal Aunt        lived to 72; pat great aunt   Vascular Disease Paternal Grandmother        thoracic aneurysm   Esophageal cancer Neg Hx    Rectal cancer Neg Hx    Pancreatic cancer Neg Hx      Current Outpatient Medications (Cardiovascular):    ezetimibe (ZETIA) 10 MG tablet, Take 1 tablet by mouth once daily   rosuvastatin (CRESTOR) 40 MG tablet, Take 1 tablet by mouth once daily   Current Outpatient Medications (Analgesics):    aspirin EC 81 MG tablet, Take 1 tablet (81 mg total) by mouth daily.   ibuprofen (ADVIL) 200 MG tablet, Take 200 mg by mouth every 6 (six) hours as needed.   Current Outpatient Medications (Other):    bimatoprost (LUMIGAN) 0.01 % SOLN, Place 1 drop into both eyes at bedtime.   brinzolamide (AZOPT) 1 % ophthalmic suspension, 1 drop 2 (two) times daily.   calcium carbonate (TUMS - DOSED IN MG ELEMENTAL CALCIUM) 500 MG chewable tablet, Chew 1 tablet by mouth as needed for indigestion or heartburn (1000 mg).    Carboxymethylcellulose Sodium (REFRESH LIQUIGEL OP), Apply 1 drop to eye 4 (four) times daily as needed.   Cholecalciferol (VITAMIN D3 PO), Take 5,000 Int'l Units by mouth daily.    [employer]S SUNSCREEN SPF 30 EX, apply topically to face and body daily for 30   famotidine (PEPCID) 10 MG tablet, Take 10 mg by mouth daily.   letrozole (FEMARA) 2.5 MG tablet, Take 1 tablet (2.5 mg total) by mouth daily. *TEVA Brand Only   sodium chloride (MURO 128) 5 % ophthalmic solution, Place 1 drop into both eyes 2 (two) times a day. Before Azopt   Vaginal Lubricant  (REPLENS) GEL, Place 2 application  vaginally 2 (two) times a week.     Review of Systems:  No headache, visual changes, nausea, vomiting, diarrhea, constipation, dizziness, abdominal pain, skin rash, fevers, chills, night sweats, weight loss, swollen lymph nodes, body aches, joint swelling, chest pain, shortness of breath, mood changes.   Objective  Blood pressure 122/68, pulse 80, height 5\' 4"  (1.626 m), weight 160 lb (72.6 kg), SpO2 97 %.   General: No apparent distress alert and oriented x3 mood and affect normal, dressed appropriately.  HEENT: Pupils equal, extraocular movements intact  Respiratory: Patient's speak in full sentences and does not appear short of breath  Cardiovascular: No lower extremity edema, non tender, no erythema  Patient back exam does have some mild loss of lordosis.  Some mild tightness with FABER test right greater than left. Patient noted does have good range of motion of the ankles noted at the moment.  Nontender on exam of the foot. Right knee exam mild crepitus but nontender on exam today of the patellofemoral joint.   Impression and Recommendations:

## 2022-08-16 ENCOUNTER — Other Ambulatory Visit (HOSPITAL_BASED_OUTPATIENT_CLINIC_OR_DEPARTMENT_OTHER): Payer: Self-pay

## 2022-08-16 ENCOUNTER — Ambulatory Visit (INDEPENDENT_AMBULATORY_CARE_PROVIDER_SITE_OTHER): Payer: Medicare Other | Admitting: Family Medicine

## 2022-08-16 VITALS — BP 122/68 | HR 80 | Ht 64.0 in | Wt 160.0 lb

## 2022-08-16 DIAGNOSIS — M1711 Unilateral primary osteoarthritis, right knee: Secondary | ICD-10-CM

## 2022-08-16 DIAGNOSIS — M5030 Other cervical disc degeneration, unspecified cervical region: Secondary | ICD-10-CM

## 2022-08-16 DIAGNOSIS — M1812 Unilateral primary osteoarthritis of first carpometacarpal joint, left hand: Secondary | ICD-10-CM | POA: Diagnosis not present

## 2022-08-16 NOTE — Assessment & Plan Note (Signed)
Stable no change in treatment.

## 2022-08-16 NOTE — Assessment & Plan Note (Signed)
Stable and seems to be doing well.  Discussed posture and ergonomics, discussed which activities to do and which ones to avoid, increase activity slowly.  This is the best time seen patient in a while and can follow-up with me as needed

## 2022-08-17 ENCOUNTER — Other Ambulatory Visit (HOSPITAL_BASED_OUTPATIENT_CLINIC_OR_DEPARTMENT_OTHER): Payer: Self-pay

## 2022-08-20 ENCOUNTER — Other Ambulatory Visit (HOSPITAL_BASED_OUTPATIENT_CLINIC_OR_DEPARTMENT_OTHER): Payer: Self-pay

## 2022-08-21 ENCOUNTER — Other Ambulatory Visit (HOSPITAL_BASED_OUTPATIENT_CLINIC_OR_DEPARTMENT_OTHER): Payer: Self-pay

## 2022-08-22 ENCOUNTER — Other Ambulatory Visit (HOSPITAL_BASED_OUTPATIENT_CLINIC_OR_DEPARTMENT_OTHER): Payer: Self-pay

## 2022-08-22 ENCOUNTER — Telehealth: Payer: Self-pay

## 2022-08-22 NOTE — Telephone Encounter (Signed)
Returned Pt's call regarding letrozole. Pt states she only has 2 more pills and the rx is on backorder with [facility]. Per MD, Pt can stop rx as she has been taking for 7 years. Pt verbalized understanding.

## 2022-09-17 ENCOUNTER — Other Ambulatory Visit: Payer: Self-pay | Admitting: Cardiovascular Disease

## 2022-09-21 ENCOUNTER — Other Ambulatory Visit (HOSPITAL_COMMUNITY): Payer: Self-pay

## 2022-09-25 ENCOUNTER — Encounter: Payer: Self-pay | Admitting: Obstetrics and Gynecology

## 2022-09-25 ENCOUNTER — Encounter: Payer: Self-pay | Admitting: Pharmacist Clinician (PhC)/ Clinical Pharmacy Specialist

## 2022-09-25 NOTE — Progress Notes (Signed)
Patient Care Team: Pincus Sanes, MD as PCP - General (Internal Medicine) Lennette Bihari, MD as PCP - Cardiology (Cardiology) Fontaine, Nadyne Coombes, MD (Inactive) as Consulting Physician (Gynecology) Armbruster, Willaim Rayas, MD as Consulting Physician (Gastroenterology) Nelson Chimes, MD as Consulting Physician (Ophthalmology) Erlene Quan, Vinnie Level, [facility] (Inactive) as Pharmacist (Pharmacist) Duke, Roe Rutherford, PA as Physician Assistant (Cardiology)  DIAGNOSIS: No diagnosis found.  SUMMARY OF ONCOLOGIC HISTORY: Oncology History  Breast cancer of upper-inner quadrant of left female breast ([facility])  05/18/2015 Initial Diagnosis   Left breast biopsy: Invasive ductal carcinoma low to intermediate grade, ER 100%, PR 95%, HER-2 negative ratio 1.02, Ki-67 5%, left breast asymmetry 1.3 cm, T1c N0 stage IA   05/31/2015 Surgery   Left lumpectomy Dwain Sarna): Invasive ductal carcinoma grade 2, 1.5 cm, 0/3 lymph nodes negative, broadly involves inferior margin, ER 100%, PR 95%, HER-2 negative duration 112, Ki-67 5%, T1 cN0 stage IA, Oncotype DX score 18, 12% ROR   05/31/2015 Oncotype testing   Oncotype DX score: 18/12% ROR   07/01/2015 Surgery   Reexcision of the left inferior margin: Fibrocystic changes with UDH   08/10/2015 - 09/16/2015 Radiation Therapy   Adj XRT ([place]). 50.4 Gy in 28 fractions to the left breast   10/06/2015 -  Anti-estrogen oral therapy   Anastrozole 1 mg by mouth daily 5 years, switched to letrozole 11/09/2015 due to dizziness lightheadedness and fatigue     CHIEF COMPLIANT:  Follow-up of left breast cancer on letrozole    INTERVAL HISTORY: Lindsey Barrett is a 77 y.o. with above-mentioned history of left breast cancer and is currently on oral antiestrogen therapy with letrozole. She presents to the clinic today for follow-up.    ALLERGIES:  is allergic to arimidex [anastrozole], latex, septra ds [sulfamethoxazole-trimethoprim], epinephrine, sulfa antibiotics, sulfur,  adhesive [tape], and augmentin [amoxicillin-pot clavulanate].  MEDICATIONS:  Current Outpatient Medications  Medication Sig Dispense Refill   aspirin EC 81 MG tablet Take 1 tablet (81 mg total) by mouth daily.     bimatoprost (LUMIGAN) 0.01 % SOLN Place 1 drop into both eyes at bedtime.     brinzolamide (AZOPT) 1 % ophthalmic suspension 1 drop 2 (two) times daily.     calcium carbonate (TUMS - DOSED IN MG ELEMENTAL CALCIUM) 500 MG chewable tablet Chew 1 tablet by mouth as needed for indigestion or heartburn (1000 mg).      Carboxymethylcellulose Sodium (REFRESH LIQUIGEL OP) Apply 1 drop to eye 4 (four) times daily as needed.     Cholecalciferol (VITAMIN D3 PO) Take 5,000 Int'l Units by mouth daily.      CVS SUNSCREEN SPF 30 EX apply topically to face and body daily for 30     ezetimibe (ZETIA) 10 MG tablet Take 1 tablet by mouth once daily 90 tablet 2   famotidine (PEPCID) 10 MG tablet Take 10 mg by mouth daily.     ibuprofen (ADVIL) 200 MG tablet Take 200 mg by mouth every 6 (six) hours as needed.     letrozole (FEMARA) 2.5 MG tablet Take 1 tablet (2.5 mg total) by mouth daily. *TEVA Brand Only 90 tablet 3   rosuvastatin (CRESTOR) 40 MG tablet Take 1 tablet by mouth once daily 90 tablet 0   sodium chloride (MURO 128) 5 % ophthalmic solution Place 1 drop into both eyes 2 (two) times a day. Before Azopt     Vaginal Lubricant (REPLENS) GEL Place 2 application  vaginally 2 (two) times a week.  No current facility-administered medications for this visit.    PHYSICAL EXAMINATION: ECOG PERFORMANCE STATUS: {CHL ONC ECOG PS:[id number]}  There were no vitals filed for this visit. There were no vitals filed for this visit.  BREAST:*** No palpable masses or nodules in either right or left breasts. No palpable axillary supraclavicular or infraclavicular adenopathy no breast tenderness or nipple discharge. (exam performed in the presence of a chaperone)  LABORATORY DATA:  I have reviewed the  data as listed    Latest Ref Rng & Units 05/18/2022    9:40 AM 05/26/2021    8:58 AM 10/18/2020    1:15 PM  CMP  Glucose 70 - 99 mg/dL 161  096  045   BUN 6 - 23 mg/dL 12  12  12    Creatinine 0.40 - 1.20 mg/dL 4.09  8.11  9.14   Sodium 135 - 145 mEq/L 141  141  138   Potassium 3.5 - 5.1 mEq/L 4.0  4.0  4.1   Chloride 96 - 112 mEq/L 104  106  104   CO2 19 - 32 mEq/L 31  30  28    Calcium 8.4 - 10.5 mg/dL 9.2  8.8  9.5    9.4   Total Protein 6.0 - 8.3 g/dL 7.0  6.7  7.1   Total Bilirubin 0.2 - 1.2 mg/dL 2.0  1.5  1.7   Alkaline Phos 39 - 117 U/L 54  48  51   AST 0 - 37 U/L 23  20  25    ALT 0 - 35 U/L 18  16  23      Lab Results  Component Value Date   WBC 7.9 05/18/2022   HGB 14.2 05/18/2022   HCT 43.4 05/18/2022   MCV 87.0 05/18/2022   PLT 251.0 05/18/2022   NEUTROABS 3.5 05/18/2022    ASSESSMENT & PLAN:  No problem-specific Assessment & Plan notes found for this encounter.    No orders of the defined types were placed in this encounter.  The patient has a good understanding of the overall plan. she agrees with it. she will call with any problems that may develop before the next visit here. Total time spent: 30 mins including face to face time and time spent for planning, charting and co-ordination of care   Sherlyn Lick, CMA 75/21/24    I Janan Ridge am acting as a Neurosurgeon for [employer]  ***

## 2022-09-27 ENCOUNTER — Inpatient Hospital Stay: Payer: Medicare Other | Attending: Hematology and Oncology | Admitting: Hematology and Oncology

## 2022-09-27 VITALS — BP 127/64 | HR 78 | Temp 98.0°F | Wt 167.0 lb

## 2022-09-27 DIAGNOSIS — C50212 Malignant neoplasm of upper-inner quadrant of left female breast: Secondary | ICD-10-CM | POA: Insufficient documentation

## 2022-09-27 DIAGNOSIS — Z79811 Long term (current) use of aromatase inhibitors: Secondary | ICD-10-CM | POA: Insufficient documentation

## 2022-09-27 DIAGNOSIS — Z170 Estrogen receptor positive status [ER+]: Secondary | ICD-10-CM | POA: Insufficient documentation

## 2022-09-27 DIAGNOSIS — Z923 Personal history of irradiation: Secondary | ICD-10-CM | POA: Diagnosis not present

## 2022-09-27 NOTE — Assessment & Plan Note (Addendum)
Left lumpectomy 05/31/2015: Invasive ductal carcinoma grade 2, 1.5 cm, 0/3 lymph nodes negative, broadly involves inferior margin, ER 100%, PR 95%, HER-2 negative, Ki-67 5%, T1 cN0 stage IA, Oncotype DX score 18, 12% ROR, Reexcision of margins negative   Adjuvant radiation therapy with Dr. Roselind Messier 08/10/15- 09/16/15   Current treatment: Anastrozole 1 mg daily started June1st 2017 stopped 10/17/2015 due to dizziness lightheadedness and fatigue, switched to letrozole starting 11/09/2015-08/23/2022   Letrozole toxicities: 1.  Hot flashes     She was diagnosed with glaucoma and is getting eye checks frequently.   Marie surveillance: 1. Breast exam 09/27/2022: Mild lymphedema 2. Mammogram 05/18/2022 at [facility]: Benign PET CT scan 07/29/2018: Evaluation of lung nodule: No hypermetabolic activity in the 8 mm right apical nodule   Return to clinic on an as-needed basis.

## 2022-10-02 ENCOUNTER — Telehealth: Payer: Self-pay | Admitting: Pharmacist Clinician (PhC)/ Clinical Pharmacy Specialist

## 2022-10-02 ENCOUNTER — Other Ambulatory Visit (HOSPITAL_BASED_OUTPATIENT_CLINIC_OR_DEPARTMENT_OTHER): Payer: Self-pay

## 2022-10-02 ENCOUNTER — Other Ambulatory Visit: Payer: Self-pay | Admitting: Cardiovascular Disease

## 2022-10-02 DIAGNOSIS — E782 Mixed hyperlipidemia: Secondary | ICD-10-CM

## 2022-10-02 NOTE — Telephone Encounter (Signed)
-----   Message from Rosalee Kaufman, RPH-CPP sent at 07/05/2022  7:06 AM EST ----- 3 month lipid/liver labs - continuing on rosuvastatin/ezetimibe

## 2022-10-15 DIAGNOSIS — H40053 Ocular hypertension, bilateral: Secondary | ICD-10-CM | POA: Diagnosis not present

## 2022-10-15 DIAGNOSIS — H18593 Other hereditary corneal dystrophies, bilateral: Secondary | ICD-10-CM | POA: Diagnosis not present

## 2022-10-15 DIAGNOSIS — H524 Presbyopia: Secondary | ICD-10-CM | POA: Diagnosis not present

## 2022-10-15 DIAGNOSIS — H52223 Regular astigmatism, bilateral: Secondary | ICD-10-CM | POA: Diagnosis not present

## 2022-10-15 DIAGNOSIS — H5213 Myopia, bilateral: Secondary | ICD-10-CM | POA: Diagnosis not present

## 2022-10-15 DIAGNOSIS — H2513 Age-related nuclear cataract, bilateral: Secondary | ICD-10-CM | POA: Diagnosis not present

## 2022-10-17 ENCOUNTER — Other Ambulatory Visit: Payer: Self-pay

## 2022-10-17 DIAGNOSIS — E782 Mixed hyperlipidemia: Secondary | ICD-10-CM

## 2022-10-18 LAB — LIPID PANEL
Chol/HDL Ratio: 3.3 ratio (ref 0.0–4.4)
Cholesterol, Total: 173 mg/dL (ref 100–199)
HDL: 52 mg/dL (ref >39)
LDL Chol Calc (NIH): 97 mg/dL (ref 0–99)
Triglycerides: 136 mg/dL (ref 0–149)
VLDL Cholesterol Cal: 24 mg/dL (ref 5–40)

## 2022-10-18 LAB — HEPATIC FUNCTION PANEL
ALT: 18 IU/L (ref 0–32)
AST: 23 IU/L (ref 0–40)
Albumin: 4.2 g/dL (ref 3.8–4.8)
Alkaline Phosphatase: 63 IU/L (ref 44–121)
Bilirubin Total: 1.4 mg/dL — ABNORMAL HIGH (ref 0.0–1.2)
Bilirubin, Direct: 0.31 mg/dL (ref 0.00–0.40)
Total Protein: 6.3 g/dL (ref 6.0–8.5)

## 2022-10-22 ENCOUNTER — Encounter: Payer: Self-pay | Admitting: Cardiovascular Disease

## 2022-10-24 ENCOUNTER — Other Ambulatory Visit: Payer: Self-pay

## 2022-10-24 ENCOUNTER — Ambulatory Visit (INDEPENDENT_AMBULATORY_CARE_PROVIDER_SITE_OTHER): Payer: Medicare Other | Admitting: Family Medicine

## 2022-10-24 ENCOUNTER — Ambulatory Visit (INDEPENDENT_AMBULATORY_CARE_PROVIDER_SITE_OTHER): Payer: Medicare Other

## 2022-10-24 VITALS — BP 144/70 | Ht 64.0 in | Wt 168.0 lb

## 2022-10-24 DIAGNOSIS — M19042 Primary osteoarthritis, left hand: Secondary | ICD-10-CM | POA: Diagnosis not present

## 2022-10-24 DIAGNOSIS — M25532 Pain in left wrist: Secondary | ICD-10-CM

## 2022-10-24 NOTE — Progress Notes (Signed)
Rubin Payor, PhD, LAT, ATC acting as a scribe for Clementeen Graham, MD.  DEAUNDRA SCHRAG is a 77 y.o. female who presents to [facility] at [facility] today for L hand/wrist pain. Pt was last seen by Dr. 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   Today, pt c/o L wrist pain x 6 wks. She is R-hand dominate. Pt was trying to put the back seat of the car and then noticed L wrist pain the next day. She will experiencing instances when the wrist will "catch" on her. Pain is worse first thing in the morning. Pt locates pain to the radial aspect of the and into the radial aspect of the forearm.  Treatments tried: thumb spica wrist brace,   Dx imaging: 05/24/22 C-spine XR  10/05/21 L hand XR  Pertinent review of systems: No fevers or chills  Relevant historical information: Arthritis left first CMC   Exam:  BP (!) 144/70   Ht 5\' 4"  (1.626 m)   Wt 168 lb (76.2 kg)   BMI 28.84 kg/m  General: Well Developed, well nourished, and in no acute distress.   MSK: Left hand and wrist some bossing at first [facility].  Tender to palpation at the base of the thumb and anatomical snuffbox. Normal wrist motion. Negative Finkelstein's test. Intact strength.    Lab and Radiology Results  Diagnostic Limited MSK Ultrasound of: Left wrist First dorsal wrist compartment hypoechoic fluid tracks within tendon sheath at first dorsal wrist compartment tendon sheath. Degeneration and effusion present at first [facility]. Mild wrist effusion is present. Impression: Possible de Quervain's tenosynovitis in the setting of first CMC DJD and wrist effusion.  X-ray images left hand obtained today personally and independently interpreted Degenerative changes and possible avulsion at the first [facility].  Avulsion appears to be old.  No acute fractures present. Await formal radiology review    Assessment and Plan: 77 y.o. female with left hand and wrist pain.  She has several different  candidates for pain.  She is not particularly tender to palpation at the radial styloid and does not have a positive Finkelstein's test so I do not think de Quervain's tenosynovitis is the main source of pain.  I think she has arthritis at the base of the thumb which is probably the main issue.  We discussed options.  Plan for trial of occupational therapy. Recheck and 1 month or so.  PDMP not reviewed this encounter. Orders Placed This Encounter  Procedures   [place] LIMITED JOINT SPACE STRUCTURES UP LEFT(NO LINKED CHARGES)    Order Specific Question:   Reason for Exam (SYMPTOM  OR DIAGNOSIS REQUIRED)    Answer:   left wrist pain    Order Specific Question:   Preferred imaging location?    Answer:   Adult nurse [facility] Hand Complete Left    Standing Status:   Future    Number of Occurrences:   1    Standing Expiration Date:   10/24/2023    Order Specific Question:   Reason for Exam (SYMPTOM  OR DIAGNOSIS REQUIRED)    Answer:   eval left thumb pain    Order Specific Question:   Preferred imaging location?    Answer:   [facility]   Ambulatory referral to Occupational Therapy    Referral Priority:   Routine    Referral Type:   Occupational Therapy    Referral Reason:   Specialty Services Required  Requested Specialty:   Occupational Therapy    Number of Visits Requested:   1   No orders of the defined types were placed in this encounter.    Discussed warning signs or symptoms. Please see discharge instructions. Patient expresses understanding.   The above documentation has been reviewed and is accurate and complete Clementeen Graham, M.D.

## 2022-10-24 NOTE — Patient Instructions (Addendum)
Thank you for coming in today.   Please get an Xray today before you leave   I've referred you to Occupational Therapy.  Let us know if you don't hear from them in one week.   Please use Voltaren gel (Generic Diclofenac Gel) up to 4x daily for pain as needed.  This is available over-the-counter as both the name brand Voltaren gel and the generic diclofenac gel.   Continue the wrist brace as needed.

## 2022-10-26 ENCOUNTER — Other Ambulatory Visit: Payer: Self-pay

## 2022-10-26 ENCOUNTER — Telehealth: Payer: Self-pay | Admitting: Family Medicine

## 2022-10-26 DIAGNOSIS — M25532 Pain in left wrist: Secondary | ICD-10-CM

## 2022-10-26 NOTE — Telephone Encounter (Signed)
Patient called after hours and left a message stating that she was under the impression that she was being referred to someone named Nate on [place] but she received a call from [employer] at [employer]?  Please advise.

## 2022-10-26 NOTE — Telephone Encounter (Signed)
Spoke to pt and apolgies for the error. Re-entered the referral to Nate at [facility].  Sending her a MyChart message w/ the contact info.

## 2022-10-29 ENCOUNTER — Other Ambulatory Visit (HOSPITAL_BASED_OUTPATIENT_CLINIC_OR_DEPARTMENT_OTHER): Payer: Self-pay

## 2022-10-29 ENCOUNTER — Other Ambulatory Visit: Payer: Self-pay | Admitting: Internal Medicine

## 2022-10-29 NOTE — Progress Notes (Signed)
Left hand x-ray shows arthritis especially at the thumb and index finger and near the wrist.

## 2022-10-30 ENCOUNTER — Other Ambulatory Visit (HOSPITAL_BASED_OUTPATIENT_CLINIC_OR_DEPARTMENT_OTHER): Payer: Self-pay

## 2022-10-30 MED ORDER — ROSUVASTATIN CALCIUM 40 MG PO TABS
40.0000 mg | ORAL_TABLET | Freq: Every day | ORAL | 3 refills | Status: DC
  Filled 2022-10-30: qty 90, 90d supply, fill #0

## 2022-11-15 NOTE — Therapy (Signed)
OUTPATIENT OCCUPATIONAL THERAPY ORTHO EVALUATION  Patient Name: Lindsey Barrett MRN: [id number] DOB:1945/10/13, 77 y.o., female Today's Date: 11/16/2022  PCP: Eileen Stanford, MD REFERRING PROVIDER:  Rodolph Bong, MD    END OF SESSION:  OT End of Session - 11/16/22 1055     Visit Number 1    Number of Visits 8    Date for OT Re-Evaluation 12/28/22    Authorization Type Medicare    OT Start Time 1057    OT Stop Time 1155    OT Time Calculation (min) 58 min    Equipment Utilized During Treatment tubigrip sleeve    Activity Tolerance Patient tolerated treatment well;Patient limited by fatigue;Patient limited by pain    Behavior During Therapy WFL for tasks assessed/performed             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   Breast cyst    x3   DDD (degenerative disc disease)    Diverticulosis    Fasting hyperglycemia    GERD (gastroesophageal reflux disease)    Sullivan Lone syndrome    Glaucoma    History of hiatal hernia    HSV infection    Hyperlipidemia    Radiation    left breast 50.4 gray   STD (sexually transmitted disease)    HSV   Past Surgical History:  Procedure Laterality Date   APPENDECTOMY  1961   BREAST CYST ASPIRATION     x2 right ,x1 left   COLONOSCOPY  2007   neg   DILATION AND CURETTAGE OF UTERUS  1975   FLEXIBLE SIGMOIDOSCOPY     x2   PELVIC LAPAROSCOPY     RADIOACTIVE SEED GUIDED PARTIAL MASTECTOMY WITH AXILLARY SENTINEL LYMPH NODE BIOPSY Left 05/31/2015   Procedure: LEFT BREAST RADIOACTIVE SEED GUIDED LUMPECTOMY WITH LEFT AXILLARY SENTINEL LYMPH NODE BIOPSY;  Surgeon: Emelia Loron, MD;  Location: [facility];  Service: General;  Laterality: Left;   RE-EXCISION OF BREAST LUMPECTOMY Left 07/01/2015   Procedure: RE-EXCISION OF LEFT BREAST INFERIOR MARGIN;  Surgeon: Emelia Loron, MD;   Location: [facility];  Service: General;  Laterality: Left;   UPPER GASTROINTESTINAL ENDOSCOPY     UPPER GI ENDOSCOPY  10/2010   Dr Tori Milks TOOTH EXTRACTION  1978   Patient Active Problem List   Diagnosis Date Noted   DDD (degenerative disc disease), cervical 05/24/2022   Nevus of toe of right foot 03/27/2022   Venous insufficiency of both lower extremities 03/27/2022   Pain of left calf 03/27/2022   Aortic atherosclerosis (HCC) 03/27/2022   Dysuria 10/26/2021   Arthritis of carpometacarpal (CMC) joint of left thumb 10/18/2021   Rash 08/18/2021   Light headedness 03/29/2021   Dermatitis 12/09/2020   Low back pain 10/18/2020   COVID-19 08/31/2020   Patellofemoral arthritis of right knee 08/25/2020   Left wrist pain 04/19/2020   Left leg pain 04/18/2020   Acute pain of right knee 03/09/2020   Sensorineural hearing loss (SNHL) of both ears 03/20/2019   Deviated nasal septum 03/02/2019   Pulmonary nodule 07/29/2018   Dupuytren's contracture of both hands 07/11/2018   Ganglion cyst of volar aspect of left wrist 06/04/2018   Frozen shoulder 03/18/2018   Right rotator cuff tear 10/15/2017   Tinnitus of both ears 01/01/2017   Reaction to internal  stress 05/29/2016   Prediabetes 08/10/2015   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   Open-angle glaucoma 06/17/2014   Vitamin D deficiency 10/18/2011   HSV infection    Atrophic vaginitis    VARICOSE VEINS, LOWER EXTREMITIES 09/28/2009   GERD 07/14/2009   Hyperlipidemia 12/26/2006   GILBERT'S SYNDROME 08/09/2006   DEGENERATIVE DISC DISEASE 08/09/2006    ONSET DATE: ~8 weeks onset   REFERRING DIAG: M25.532 (ICD-10-CM) - Left wrist pain   THERAPY DIAG:  Pain in left wrist  Stiffness of left wrist, not elsewhere classified  Other lack of coordination  Muscle weakness (generalized)  Rationale for Evaluation and Treatment: Rehabilitation  SUBJECTIVE:   SUBJECTIVE  STATEMENT: Healthy and active. Several months ago, she states almost tripping and catching herself with Lt arm, which was painful. After that she was struggling to push down a still car seat which exacerbated it. This was ~3 months ago per Pt. she was given a prefabricated wrist cock up brace by her doctor, but she states wearing it only intermittently and still performing some painful activities and routines.  She enjoys crochet and does 3-4 hours at a time. She is retired Psychologist, occupational.     PERTINENT HISTORY: Per MD notes: " pt c/o L wrist pain x 6 wks. She is R-hand dominate. Pt was trying to put the back seat of the car and then noticed L wrist pain the next day. She will experiencing instances when the wrist will "catch" on her. Pain is worse first thing in the morning. Pt locates pain to the radial aspect of the and into the radial aspect of the forearm. " "Possible de Quervain's tenosynovitis in the setting of first CMC DJD and wrist effusion. X-ray images left hand obtained today personally and independently interpreted: Degenerative changes and possible avulsion at the first [facility].  Avulsion appears to be old.  No acute fractures present. Await formal radiology review."   PRECAUTIONS: None and Other: latex allergy  RED FLAGS: None   WEIGHT BEARING RESTRICTIONS:  no but recommended <5# for 1-2 weeks in Lt hand   PAIN:  Are you having pain? Yes: NPRS scale: none at rest but at worst 5-6/10 Pain location: Lt 1st dorsal compartment Pain description: shooting pain at times  Aggravating factors: supination with wrist and thumb ext Relieving factors: rest, heat, ice  FALLS: Has patient fallen in last 6 months? No   PLOF: Independent  PATIENT GOALS: To improve pain and function with left hand and arm and learn the management strategies for this condition.   OBJECTIVE: (All objective assessments below are from initial evaluation on: 11/16/22 unless otherwise specified.)   HAND DOMINANCE: Right    ADLs: Overall ADLs: States decreased ability to grab, hold household objects, pain and inability to open containers, perform FMS tasks (manipulate fasteners on clothing), mild to moderate bathing problems as well.   FUNCTIONAL OUTCOME MEASURES: Eval: Quick DASH 27% impairment today  (Higher % Score  =  More Impairment)    UPPER EXTREMITY ROM     Shoulder to Wrist AROM Left eval  Forearm supination 80  Forearm pronation  90  Wrist flexion 56  Wrist extension 62  Wrist ulnar deviation 40  Wrist radial deviation 9  (Blank rows = not tested)   Hand AROM Left eval  Full Fist Ability (or Gap to Distal Palmar Crease) full  Thumb Opposition  (Kapandji Scale)  10/10  (Blank rows = not tested)   UPPER EXTREMITY  MMT:     MMT Left 11/16/22  Forearm supination 5/5  Forearm pronation 4/5 pain  Wrist flexion 5/5  Wrist extension 5/5  Wrist ulnar deviation 5/5 pain  Wrist radial deviation 4-/5  (Blank rows = not tested)  HAND FUNCTION: Eval: Observed weakness in affected hand.  Grip strength Right: 45 lbs, Left: 30 lbs   COORDINATION: Eval: Observed coordination impairments with affected hand.  SENSATION: Eval:  Light touch intact today  EDEMA:   Eval:  Mildly swollen in Lt wrist today  COGNITION: Eval: Overall cognitive status: WFL for evaluation today   OBSERVATIONS:   Eval: She has pain with active wrist radial deviation as well as active supination with thumb extension.  She has very mild tenderness to palpation in the first dorsal compartment of the wrist.  Interestingly she has no pain with resisted wrist extension or flexion and Finkelstein's test is very mild.  The first [facility] joint of the thumb is not significantly tender to palpation today and resisted thumb extension is not significantly painful either.  She presents like deQuervain's tenosynovitis today more so than any other condition- the presentation is just slightly atypical.  Likely due to trying to manage  the condition, trying to rest, etc.   TODAY'S TREATMENT:  Post-evaluation treatment:  She was given self-care education to avoid repetitive motion and painful positions, wear her prefabricated wrist and thumb brace with anything that normally hurts her and if she would like to consider her weightlifting routines.  She was recommended not to lift more than 5 pounds especially without the brace on.  She was recommended to avoid cocking wrist and radial deviation with thumb extension and supination.  Next, she was given a Tubigrip sleeve as she states her brace has been mildly irritating to her skin and she does have history of allergies.  Next, she was given the following home exercises and told to use moist heat for 5 minutes first and potentially ice at the end of sore or swollen.  These were briefly gone over with her but will need reviewed next week.  Exercises - Wrist Prayer Stretch  - 3-4 x daily - 3-5 reps - 15 sec hold - Seated Wrist Flexion Stretch  - 3-4 x daily - 3 reps - 15 hold - Seated Composite Thumb Flexion PROM  - 2-3 x daily - 3-5 reps - 15 sec hold - Turn [employer] Facing Up & Down  - 3-4 x daily - 10 reps - Towel Roll Grip with Forearm in Neutral  - 3 x daily - 5 reps - 10 sec hold    PATIENT EDUCATION: Education details: See tx section above for details  Person educated: Patient Education method: Verbal Instruction, Teach back, Handouts  Education comprehension: States and demonstrates understanding, Additional Education required    HOME EXERCISE PROGRAM: Access Code: [email or web address] URL: [email or web address] Date: 11/16/2022 Prepared by: Fannie Knee   GOALS: Goals reviewed with patient? Yes   SHORT TERM GOALS: (STG required if POC>30 days) Target Date: 11/30/22  Pt will obtain protective, custom orthotic.  Goal status: TBD/PRN  2.  Pt will demo/state understanding of initial HEP to improve pain levels and prerequisite motion. Goal status:  INITIAL   LONG TERM GOALS: Target Date: 12/28/22  Pt will improve functional ability by decreased impairment per Quick DASH assessment from 27% to 10% or better, for better quality of life. Goal status: INITIAL  2.  Pt will improve grip strength in Lt hand from 30lbs to at least  40lbs for functional use at home and in IADLs. Goal status: INITIAL  3.  Pt will improve A/ROM in Lt wrist flex/ext from 56/62 to at least 65* both, to have functional motion for tasks like reach and grasp.  Goal status: INITIAL  4.  Pt will improve strength in wrist radial deviation from painful 4-/5 MMT to at least 4+/5 MMT to have increased functional ability to carry out selfcare and higher-level homecare tasks with no difficulty. Goal status: INITIAL  5.  Pt will decrease pain at worst from 4-5/10 to 2/10 or better to have better sleep and occupational participation in daily roles. Goal status: INITIAL   ASSESSMENT:  CLINICAL IMPRESSION: Patient is a 77 y.o. female who was seen today for occupational therapy evaluation for left forearm pain in the first dorsal compartment, stiffness, decreased functional ability.  She will benefit from outpatient occupational therapy to increase quality of life.   PERFORMANCE DEFICITS: in functional skills including ADLs, IADLs, coordination, dexterity, ROM, strength, pain, fascial restrictions, flexibility, Gross motor control, body mechanics, endurance, decreased knowledge of precautions, and UE functional use, cognitive skills including problem solving and safety awareness, and psychosocial skills including coping strategies and habits.   IMPAIRMENTS: are limiting patient from IADLs and leisure.   COMORBIDITIES: may have co-morbidities  that affects occupational performance. Patient will benefit from skilled OT to address above impairments and improve overall function.  MODIFICATION OR ASSISTANCE TO COMPLETE EVALUATION: No modification of tasks or assist necessary to  complete an evaluation.  OT OCCUPATIONAL PROFILE AND HISTORY: Problem focused assessment: Including review of records relating to presenting problem.  CLINICAL DECISION MAKING: LOW - limited treatment options, no task modification necessary  REHAB POTENTIAL: Excellent  EVALUATION COMPLEXITY: Low      PLAN:  OT FREQUENCY: 1x/week  OT DURATION: 6 weeks  PLANNED INTERVENTIONS: self care/ADL training, therapeutic exercise, therapeutic activity, neuromuscular re-education, manual therapy, passive range of motion, splinting, ultrasound, fluidotherapy, compression bandaging, moist heat, cryotherapy, contrast bath, patient/family education, energy conservation, coping strategies training, and Dry needling  RECOMMENDED OTHER SERVICES: none now   CONSULTED AND AGREED WITH PLAN OF CARE: Patient  PLAN FOR NEXT SESSION:   Review home exercise program and avoidance of pain and repetitive motion and painful lifting. Try manual therapy and modalities as helpful.  Upgrade to light strengthening at the wrist and forearm as tolerated   Fannie Knee, OTR/L 11/16/2022, 12:13 PM

## 2022-11-16 ENCOUNTER — Ambulatory Visit (INDEPENDENT_AMBULATORY_CARE_PROVIDER_SITE_OTHER): Payer: Medicare Other | Admitting: Rehabilitative and Restorative Service Providers"

## 2022-11-16 ENCOUNTER — Other Ambulatory Visit: Payer: Self-pay

## 2022-11-16 ENCOUNTER — Encounter: Payer: Self-pay | Admitting: Rehabilitative and Restorative Service Providers"

## 2022-11-16 DIAGNOSIS — M25532 Pain in left wrist: Secondary | ICD-10-CM | POA: Diagnosis not present

## 2022-11-16 DIAGNOSIS — M25632 Stiffness of left wrist, not elsewhere classified: Secondary | ICD-10-CM

## 2022-11-16 DIAGNOSIS — M6281 Muscle weakness (generalized): Secondary | ICD-10-CM | POA: Diagnosis not present

## 2022-11-16 DIAGNOSIS — R278 Other lack of coordination: Secondary | ICD-10-CM

## 2022-11-22 NOTE — Therapy (Signed)
OUTPATIENT OCCUPATIONAL THERAPY TREATMENT NOTE  Patient Name: Lindsey Barrett MRN: [id number] DOB:[birth year], 77 y.o., female Today's Date: 11/27/2022  PCP: Eileen Stanford, MD REFERRING PROVIDER:  Rodolph Bong, MD    END OF SESSION:  OT End of Session - 11/27/22 1016     Visit Number 2    Number of Visits 8    Date for OT Re-Evaluation 12/28/22    Authorization Type Medicare    OT Start Time 1016    OT Stop Time 1105    OT Time Calculation (min) 49 min    Equipment Utilized During Treatment --    Activity Tolerance Patient tolerated treatment well;Patient limited by fatigue;No increased pain;Patient limited by pain    Behavior During Therapy [facility] for tasks assessed/performed             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   Breast cyst    x3   DDD (degenerative disc disease)    Diverticulosis    Fasting hyperglycemia    GERD (gastroesophageal reflux disease)    Sullivan Lone syndrome    Glaucoma    History of hiatal hernia    HSV infection    Hyperlipidemia    Radiation    left breast 50.4 gray   STD (sexually transmitted disease)    HSV   Past Surgical History:  Procedure Laterality Date   APPENDECTOMY  1961   BREAST CYST ASPIRATION     x2 right ,x1 left   COLONOSCOPY  2007   neg   DILATION AND CURETTAGE OF UTERUS  1975   FLEXIBLE SIGMOIDOSCOPY     x2   PELVIC LAPAROSCOPY     RADIOACTIVE SEED GUIDED PARTIAL MASTECTOMY WITH AXILLARY SENTINEL LYMPH NODE BIOPSY Left 05/31/2015   Procedure: LEFT BREAST RADIOACTIVE SEED GUIDED LUMPECTOMY WITH LEFT AXILLARY SENTINEL LYMPH NODE BIOPSY;  Surgeon: Emelia Loron, MD;  Location: [facility];  Service: General;  Laterality: Left;   RE-EXCISION OF BREAST LUMPECTOMY Left 07/01/2015   Procedure: RE-EXCISION OF LEFT BREAST INFERIOR MARGIN;  Surgeon: Emelia Loron,  MD;  Location: [facility];  Service: General;  Laterality: Left;   UPPER GASTROINTESTINAL ENDOSCOPY     UPPER GI ENDOSCOPY  10/2010   Dr Tori Milks TOOTH EXTRACTION  1978   Patient Active Problem List   Diagnosis Date Noted   DDD (degenerative disc disease), cervical 05/24/2022   Nevus of toe of right foot 03/27/2022   Venous insufficiency of both lower extremities 03/27/2022   Pain of left calf 03/27/2022   Aortic atherosclerosis (HCC) 03/27/2022   Dysuria 10/26/2021   Arthritis of carpometacarpal (CMC) joint of left thumb 10/18/2021   Rash 08/18/2021   Light headedness 03/29/2021   Dermatitis 12/09/2020   Low back pain 10/18/2020   COVID-19 08/31/2020   Patellofemoral arthritis of right knee 08/25/2020   Left wrist pain 04/19/2020   Left leg pain 04/18/2020   Acute pain of right knee 03/09/2020   Sensorineural hearing loss (SNHL) of both ears 03/20/2019   Deviated nasal septum 03/02/2019   Pulmonary nodule 07/29/2018   Dupuytren's contracture of both hands 07/11/2018   Ganglion cyst of volar aspect of left wrist 06/04/2018   Frozen shoulder 03/18/2018   Right rotator cuff tear 10/15/2017   Tinnitus of both ears 01/01/2017   Reaction to  internal stress 05/29/2016   Prediabetes 08/10/2015   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   Open-angle glaucoma 06/17/2014   Vitamin D deficiency 10/18/2011   HSV infection    Atrophic vaginitis    VARICOSE VEINS, LOWER EXTREMITIES 09/28/2009   GERD 07/14/2009   Hyperlipidemia 12/26/2006   GILBERT'S SYNDROME 08/09/2006   DEGENERATIVE DISC DISEASE 08/09/2006    ONSET DATE: ~8 weeks onset   REFERRING DIAG: M25.532 (ICD-10-CM) - Left wrist pain   THERAPY DIAG:  Pain in left wrist  Stiffness of left wrist, not elsewhere classified  Other lack of coordination  Muscle weakness (generalized)  Rationale for Evaluation and Treatment: Rehabilitation   PERTINENT HISTORY:  Per MD notes: " pt c/o L wrist pain x 6 wks. She is R-hand dominate. Pt was trying to put the back seat of the car and then noticed L wrist pain the next day. She will experiencing instances when the wrist will "catch" on her. Pain is worse first thing in the morning. Pt locates pain to the radial aspect of the and into the radial aspect of the forearm. " "Possible de Quervain's tenosynovitis in the setting of first CMC DJD and wrist effusion. X-ray images left hand obtained today personally and independently interpreted: Degenerative changes and possible avulsion at the first [facility].  Avulsion appears to be old.  No acute fractures present. Await formal radiology review."  Healthy and active. Several months ago, she states almost tripping and catching herself with Lt arm, which was painful. After that she was struggling to push down a still car seat which exacerbated it. This was ~3 months ago per Pt. she was given a prefabricated wrist cock up brace by her doctor, but she states wearing it only intermittently and still performing some painful activities and routines.  She enjoys crochet and does 3-4 hours at a time. She is retired Psychologist, occupational.     PRECAUTIONS: None and Other: latex allergy  RED FLAGS: None   WEIGHT BEARING RESTRICTIONS: no but recommended <5# for 1-2 weeks in Lt hand    SUBJECTIVE:   SUBJECTIVE STATEMENT: She states supination is most painful HEP to base of Lt thumb. No pain now at rest, but at times she "forgets about it" and some moments/positions cause sharp shooting pains to base of thumb still. She felt a very sharp pain in Lt SF MCP J yesterday, used heat, ice, massage and it resolved (may have been tendonitis or OA flare up).  She does admit to being very active, "not taking any rest breaks" when performing chores and cleaning around the house-though she did wear her brace.   PAIN:  Are you having pain? Yes: NPRS scale:   none at rest but at worst still shoots up to "9/10 when  she forgets" about it Pain location: Lt 1st dorsal compartment Pain description: shooting pain at times  Aggravating factors: supination with wrist and thumb ext Relieving factors: rest, heat, ice  PATIENT GOALS: To improve pain and function with left hand and arm and learn the management strategies for this condition.   OBJECTIVE: (All objective assessments below are from initial evaluation on: 11/16/22 unless otherwise specified.)   HAND DOMINANCE: Right   ADLs: Overall ADLs: States decreased ability to grab, hold household objects, pain and inability to open containers, perform FMS tasks (manipulate fasteners on clothing), mild to moderate bathing problems as well.   FUNCTIONAL OUTCOME MEASURES: Eval: Quick DASH 27% impairment today  (Higher % Score  =  More Impairment)    UPPER EXTREMITY ROM     Shoulder to Wrist AROM Left eval Lt 11/27/22  Forearm supination 80   Forearm pronation  90   Wrist flexion 56 64  Wrist extension 62 63  Wrist ulnar deviation 40 40  Wrist radial deviation 9 18  (Blank rows = not tested)   Hand AROM Left eval  Full Fist Ability (or Gap to Distal Palmar Crease) full  Thumb Opposition  (Kapandji Scale)  10/10  (Blank rows = not tested)   UPPER EXTREMITY MMT:     MMT Left 11/16/22  Forearm supination 5/5  Forearm pronation 4/5 pain  Wrist flexion 5/5  Wrist extension 5/5  Wrist ulnar deviation 5/5 pain  Wrist radial deviation 4-/5  (Blank rows = not tested)  HAND FUNCTION: 11/27/22: Lt grip: 34#   Eval: Observed weakness in affected hand.  Grip strength Right: 45 lbs, Left: 30 lbs   COORDINATION: Eval: Observed coordination impairments with affected hand.  EDEMA:   Eval:  Mildly swollen in Lt wrist today  OBSERVATIONS:   Eval: She has pain with active wrist radial deviation as well as active supination with thumb extension.  She has very mild tenderness to palpation in the first dorsal compartment of the wrist.  Interestingly  she has no pain with resisted wrist extension or flexion and Finkelstein's test is very mild.  The first [facility] joint of the thumb is not significantly tender to palpation today and resisted thumb extension is not significantly painful either.  She presents like deQuervain's tenosynovitis today more so than any other condition- the presentation is just slightly atypical.  Likely due to trying to manage the condition, trying to rest, etc.   TODAY'S TREATMENT:  11/27/22: She performs AROM for exercise and new measures which shows improvements since eval visit.  OT does work with her to modified wrist stretches to increase stretch, but avoid pain. OT also edu on new pro/sup stretches using a long-handled object (spatula) and also adding stretch into wrist ulnar deviation.  She tolerates these today with no significant increase in pain, but soreness.  For self-care, self-management, OT also discusses SF pain that she experienced over the weekend and edu on possible ways to address this- band-aide around PIP J in the case of tenosynovitis, heat, ice, rest, joint distraction, PROM and stretches, etc. She states understanding. At end of session, she trials light isometric FA pro/sup and ulnar deviations, which she tolerates well, and can do at home 1-2 x day cautiously, non-painfully.   Exercises - Wrist Prayer Stretch  - 3-4 x daily - 3-5 reps - 15 sec hold - Seated Wrist Flexion Stretch  - 3-4 x daily - 3 reps - 15 hold - Seated Composite Thumb Flexion PROM  - 2-3 x daily - 3-5 reps - 15 sec hold - Turn [employer] Facing Up & Down  - 3-4 x daily - 10 reps - Towel Roll Grip with Forearm in Neutral  - 3 x daily - 5 reps - 10 sec hold - Hammer Stretch   - 2-4 x daily - 1-2 sets - 10-15 reps   PATIENT EDUCATION: Education details: See tx section above for details  Person educated: Patient Education method: Verbal Instruction, Teach back, Handouts  Education comprehension: States and demonstrates understanding,  Additional Education required    HOME EXERCISE PROGRAM: Access Code: [email or web address] URL: [email or web address] Date: 11/16/2022 Prepared by: Fannie Knee   GOALS: Goals reviewed with patient? Yes   SHORT TERM  GOALS: (STG required if POC>30 days) Target Date: 11/30/22  Pt will obtain protective, custom orthotic.  Goal status: TBD/PRN  2.  Pt will demo/state understanding of initial HEP to improve pain levels and prerequisite motion. Goal status: INITIAL   LONG TERM GOALS: Target Date: 12/28/22  Pt will improve functional ability by decreased impairment per Quick DASH assessment from 27% to 10% or better, for better quality of life. Goal status: INITIAL  2.  Pt will improve grip strength in Lt hand from 30lbs to at least 40lbs for functional use at home and in IADLs. Goal status: INITIAL  3.  Pt will improve A/ROM in Lt wrist flex/ext from 56/62 to at least 65* both, to have functional motion for tasks like reach and grasp.  Goal status: INITIAL  4.  Pt will improve strength in wrist radial deviation from painful 4-/5 MMT to at least 4+/5 MMT to have increased functional ability to carry out selfcare and higher-level homecare tasks with no difficulty. Goal status: INITIAL  5.  Pt will decrease pain at worst from 4-5/10 to 2/10 or better to have better sleep and occupational participation in daily roles. Goal status: INITIAL   ASSESSMENT:  CLINICAL IMPRESSION: 11/27/22: Motion continues to improve and function is increasing per self-report's, but she does have exacerbations at times and needs to be mindful to not "overdo anything."   PLAN:  OT FREQUENCY: 1x/week  OT DURATION: 6 weeks  PLANNED INTERVENTIONS: self care/ADL training, therapeutic exercise, therapeutic activity, neuromuscular re-education, manual therapy, passive range of motion, splinting, ultrasound, fluidotherapy, compression bandaging, moist heat, cryotherapy, contrast bath, patient/family  education, energy conservation, coping strategies training, and Dry needling  CONSULTED AND AGREED WITH PLAN OF CARE: Patient  PLAN FOR NEXT SESSION:   Check to see if she has had any more small finger MP joint pain, check on new stretch routine in motion, check to see if she has been doing light isometrics and upgrade strength as tolerated.  If pain remains centered around the thumb, perhaps she can transition to a hand-based thumb spica orthosis to allow wrist motion as a step towards weaning and increasing function.   Fannie Knee, OTR/L 11/27/2022, 1:33 PM

## 2022-11-27 ENCOUNTER — Ambulatory Visit (INDEPENDENT_AMBULATORY_CARE_PROVIDER_SITE_OTHER): Payer: Medicare Other | Admitting: Rehabilitative and Restorative Service Providers"

## 2022-11-27 ENCOUNTER — Encounter: Payer: Self-pay | Admitting: Rehabilitative and Restorative Service Providers"

## 2022-11-27 DIAGNOSIS — M25632 Stiffness of left wrist, not elsewhere classified: Secondary | ICD-10-CM

## 2022-11-27 DIAGNOSIS — M6281 Muscle weakness (generalized): Secondary | ICD-10-CM | POA: Diagnosis not present

## 2022-11-27 DIAGNOSIS — M25532 Pain in left wrist: Secondary | ICD-10-CM

## 2022-11-27 DIAGNOSIS — R278 Other lack of coordination: Secondary | ICD-10-CM

## 2022-12-04 NOTE — Therapy (Signed)
OUTPATIENT OCCUPATIONAL THERAPY TREATMENT NOTE  Patient Name: Lindsey Barrett MRN: [id number] DOB:[birth year], 77 y.o., female Today's Date: 12/06/2022  PCP: Eileen Stanford, MD REFERRING PROVIDER:  Rodolph Bong, MD    END OF SESSION:  OT End of Session - 12/06/22 1305     Visit Number 3    Number of Visits 8    Date for OT Re-Evaluation 12/28/22    Authorization Type Medicare    OT Start Time 1305    OT Stop Time 1355    OT Time Calculation (min) 50 min    Activity Tolerance Patient tolerated treatment well;Patient limited by fatigue;No increased pain;Patient limited by pain    Behavior During Therapy [facility] for tasks assessed/performed              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   Breast cyst    x3   DDD (degenerative disc disease)    Diverticulosis    Fasting hyperglycemia    GERD (gastroesophageal reflux disease)    Sullivan Lone syndrome    Glaucoma    History of hiatal hernia    HSV infection    Hyperlipidemia    Radiation    left breast 50.4 gray   STD (sexually transmitted disease)    HSV   Past Surgical History:  Procedure Laterality Date   APPENDECTOMY  1961   BREAST CYST ASPIRATION     x2 right ,x1 left   COLONOSCOPY  2007   neg   DILATION AND CURETTAGE OF UTERUS  1975   FLEXIBLE SIGMOIDOSCOPY     x2   PELVIC LAPAROSCOPY     RADIOACTIVE SEED GUIDED PARTIAL MASTECTOMY WITH AXILLARY SENTINEL LYMPH NODE BIOPSY Left 05/31/2015   Procedure: LEFT BREAST RADIOACTIVE SEED GUIDED LUMPECTOMY WITH LEFT AXILLARY SENTINEL LYMPH NODE BIOPSY;  Surgeon: Emelia Loron, MD;  Location: [facility];  Service: General;  Laterality: Left;   RE-EXCISION OF BREAST LUMPECTOMY Left 07/01/2015   Procedure: RE-EXCISION OF LEFT BREAST INFERIOR MARGIN;  Surgeon: Emelia Loron, MD;  Location: [facility];   Service: General;  Laterality: Left;   UPPER GASTROINTESTINAL ENDOSCOPY     UPPER GI ENDOSCOPY  10/2010   Dr Tori Milks TOOTH EXTRACTION  1978   Patient Active Problem List   Diagnosis Date Noted   DDD (degenerative disc disease), cervical 05/24/2022   Nevus of toe of right foot 03/27/2022   Venous insufficiency of both lower extremities 03/27/2022   Pain of left calf 03/27/2022   Aortic atherosclerosis (HCC) 03/27/2022   Dysuria 10/26/2021   Arthritis of carpometacarpal (CMC) joint of left thumb 10/18/2021   Rash 08/18/2021   Light headedness 03/29/2021   Dermatitis 12/09/2020   Low back pain 10/18/2020   COVID-19 08/31/2020   Patellofemoral arthritis of right knee 08/25/2020   Left wrist pain 04/19/2020   Left leg pain 04/18/2020   Acute pain of right knee 03/09/2020   Sensorineural hearing loss (SNHL) of both ears 03/20/2019   Deviated nasal septum 03/02/2019   Pulmonary nodule 07/29/2018   Dupuytren's contracture of both hands 07/11/2018   Ganglion cyst of volar aspect of left wrist 06/04/2018   Frozen shoulder 03/18/2018   Right rotator cuff tear 10/15/2017   Tinnitus of both ears 01/01/2017   Reaction to internal stress 05/29/2016   Prediabetes 08/10/2015  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   Open-angle glaucoma 06/17/2014   Vitamin D deficiency 10/18/2011   HSV infection    Atrophic vaginitis    VARICOSE VEINS, LOWER EXTREMITIES 09/28/2009   GERD 07/14/2009   Hyperlipidemia 12/26/2006   GILBERT'S SYNDROME 08/09/2006   DEGENERATIVE DISC DISEASE 08/09/2006    ONSET DATE: ~8 weeks onset   REFERRING DIAG: M25.532 (ICD-10-CM) - Left wrist pain   THERAPY DIAG:  Pain in left wrist  Stiffness of left wrist, not elsewhere classified  Other lack of coordination  Muscle weakness (generalized)  Rationale for Evaluation and Treatment: Rehabilitation   PERTINENT HISTORY: Per MD notes: " pt c/o L wrist pain x 6 wks.  She is R-hand dominate. Pt was trying to put the back seat of the car and then noticed L wrist pain the next day. She will experiencing instances when the wrist will "catch" on her. Pain is worse first thing in the morning. Pt locates pain to the radial aspect of the and into the radial aspect of the forearm. " "Possible de Quervain's tenosynovitis in the setting of first CMC DJD and wrist effusion. X-ray images left hand obtained today personally and independently interpreted: Degenerative changes and possible avulsion at the first [facility].  Avulsion appears to be old.  No acute fractures present. Await formal radiology review."  Healthy and active. Several months ago, she states almost tripping and catching herself with Lt arm, which was painful. After that she was struggling to push down a still car seat which exacerbated it. This was ~3 months ago per Pt. she was given a prefabricated wrist cock up brace by her doctor, but she states wearing it only intermittently and still performing some painful activities and routines.  She enjoys crochet and does 3-4 hours at a time. She is retired Psychologist, occupational.     PRECAUTIONS: None and Other: latex allergy  RED FLAGS: None   WEIGHT BEARING RESTRICTIONS: no but recommended <5# for 1-2 weeks in Lt hand    SUBJECTIVE:   SUBJECTIVE STATEMENT: She states she was doing well for 3-4 days since last seen, but starting about this Tuesday her pain is "just awful." She again admits to not wearing her brace much, and not taking breaks, doing heavy and hard activities.  She states that today she plans on "sawing pieces of lumber" after she gets home.   PAIN:  Are you having pain?  Yes: NPRS scale:  2-3/10 at rest,   up to "9/10 when she forgets" about it Pain location: Lt 1st dorsal compartment Pain description: shooting pain at times  Aggravating factors: supination with wrist and thumb ext Relieving factors: rest, heat, ice  PATIENT GOALS: To improve pain and function  with left hand and arm and learn the management strategies for this condition.   OBJECTIVE: (All objective assessments below are from initial evaluation on: 11/16/22 unless otherwise specified.)   HAND DOMINANCE: Right   ADLs: Overall ADLs: States decreased ability to grab, hold household objects, pain and inability to open containers, perform FMS tasks (manipulate fasteners on clothing), mild to moderate bathing problems as well.   FUNCTIONAL OUTCOME MEASURES: Eval: Quick DASH 27% impairment today  (Higher % Score  =  More Impairment)    UPPER EXTREMITY ROM     Shoulder to Wrist AROM Left eval Lt 11/27/22 Lt 12/06/22  Forearm supination 80    Forearm pronation  90    Wrist flexion 56 64 65  Wrist extension 62 63 64  Wrist ulnar deviation 40 40   Wrist radial deviation 9 18   (Blank rows = not tested)   Hand AROM Left eval  Full Fist Ability (or Gap to Distal Palmar Crease) full  Thumb Opposition  (Kapandji Scale)  10/10  (Blank rows = not tested)   UPPER EXTREMITY MMT:     MMT Left 11/16/22  Forearm supination 5/5  Forearm pronation 4/5 pain  Wrist flexion 5/5  Wrist extension 5/5  Wrist ulnar deviation 5/5 pain  Wrist radial deviation 4-/5  (Blank rows = not tested)  HAND FUNCTION: 11/27/22: Lt grip: 34#   Eval: Observed weakness in affected hand.  Grip strength Right: 45 lbs, Left: 30 lbs   COORDINATION: Eval: Observed coordination impairments with affected hand.  EDEMA:   Eval:  Mildly swollen in Lt wrist today  OBSERVATIONS:   Eval: She has pain with active wrist radial deviation as well as active supination with thumb extension.  She has very mild tenderness to palpation in the first dorsal compartment of the wrist.  Interestingly she has no pain with resisted wrist extension or flexion and Finkelstein's test is very mild.  The first [facility] joint of the thumb is not significantly tender to palpation today and resisted thumb extension is not significantly  painful either.  She presents like deQuervain's tenosynovitis today more so than any other condition- the presentation is just slightly atypical.  Likely due to trying to manage the condition, trying to rest, etc.   TODAY'S TREATMENT:  12/06/22: For her self-care/safety, OT reviews that this should be a resting time.  She is highly encouraged to wear her brace at least 80% of her day and then spent 20% gently moving or stretching to keep the wrist from getting stiff.  OT is sure that her pain increased after last session due to her "overdoing things."  OT tells her she should not solve lumbar with her dominant painful arm if she has been having an increase in pain.   She has not been very compliant with her long-arm brace, so to help increase compliance, OT custom fabricates a hand-based thumb spica orthosis that fits snugly and allows her to move her wrist.  She states it fits well and then it does prevent some of her pain and the wrist motion is still mildly painful.  OT advises her to wear it whenever she takes off her wrist immobilization brace.  Hopefully this will help her wean, keep her pain down, remind her to not lift heavy objects, and keep her wrist from getting very stiff.  If painful, however she should continue wearing the wrist immobilization brace and more often.  Next, OT does review her home exercise program with her-performing some stretches manually with her to teach her what the correct stretching sensation is.  OT also reviews that she could carefully perform light grip training, and she asks about using a metal grip trainer and OT for beds.  She should only be squeezing a rolled towel gently as initially instructed.  We review stretching and deviations using a spatula as a counterweight which she does tolerate fairly well today.  She is urged to not cause additional pain, to rest more often, massage and use modalities.  She states understanding    11/27/22: She performs AROM for  exercise and new measures which shows improvements since eval visit.  OT does work with her to modified wrist stretches to increase stretch, but avoid pain. OT  also edu on new pro/sup stretches using a long-handled object (spatula) and also adding stretch into wrist ulnar deviation.  She tolerates these today with no significant increase in pain, but soreness.  For self-care, self-management, OT also discusses SF pain that she experienced over the weekend and edu on possible ways to address this- band-aide around PIP J in the case of tenosynovitis, heat, ice, rest, joint distraction, PROM and stretches, etc. She states understanding. At end of session, she trials light isometric FA pro/sup and ulnar deviations, which she tolerates well, and can do at home 1-2 x day cautiously, non-painfully.   Exercises - Wrist Prayer Stretch  - 3-4 x daily - 3-5 reps - 15 sec hold - Seated Wrist Flexion Stretch  - 3-4 x daily - 3 reps - 15 hold - Seated Composite Thumb Flexion PROM  - 2-3 x daily - 3-5 reps - 15 sec hold - Turn [employer] Facing Up & Down  - 3-4 x daily - 10 reps - Towel Roll Grip with Forearm in Neutral  - 3 x daily - 5 reps - 10 sec hold - Hammer Stretch   - 2-4 x daily - 1-2 sets - 10-15 reps   PATIENT EDUCATION: Education details: See tx section above for details  Person educated: Patient Education method: Verbal Instruction, Teach back, Handouts  Education comprehension: States and demonstrates understanding, Additional Education required    HOME EXERCISE PROGRAM: Access Code: [email or web address] URL: [email or web address] Date: 11/16/2022 Prepared by: Fannie Knee   GOALS: Goals reviewed with patient? Yes   SHORT TERM GOALS: (STG required if POC>30 days) Target Date: 11/30/22  Pt will obtain protective, custom orthotic.  Goal status: TBD/PRN  2.  Pt will demo/state understanding of initial HEP to improve pain levels and prerequisite motion. Goal status: INITIAL   LONG  TERM GOALS: Target Date: 12/28/22  Pt will improve functional ability by decreased impairment per Quick DASH assessment from 27% to 10% or better, for better quality of life. Goal status: INITIAL  2.  Pt will improve grip strength in Lt hand from 30lbs to at least 40lbs for functional use at home and in IADLs. Goal status: INITIAL  3.  Pt will improve A/ROM in Lt wrist flex/ext from 56/62 to at least 65* both, to have functional motion for tasks like reach and grasp.  Goal status: INITIAL  4.  Pt will improve strength in wrist radial deviation from painful 4-/5 MMT to at least 4+/5 MMT to have increased functional ability to carry out selfcare and higher-level homecare tasks with no difficulty. Goal status: INITIAL  5.  Pt will decrease pain at worst from 4-5/10 to 2/10 or better to have better sleep and occupational participation in daily roles. Goal status: INITIAL   ASSESSMENT:  CLINICAL IMPRESSION: 12/06/22: OT was spending much of today's session to try to encourage her to rest more often and not cause herself pain.  Once her pain is more consistently down, then she can safely start building up the strength in her wrist isometrically and eccentrically.  She was warned that if she "fails" conservative treatment her only other options will be injections and possibly surgery which she states she wants to avoid.   PLAN:  OT FREQUENCY: 1x/week  OT DURATION: 6 weeks  PLANNED INTERVENTIONS: self care/ADL training, therapeutic exercise, therapeutic activity, neuromuscular re-education, manual therapy, passive range of motion, splinting, ultrasound, fluidotherapy, compression bandaging, moist heat, cryotherapy, contrast bath, patient/family education, energy conservation, coping strategies training, and Dry needling  CONSULTED AND AGREED WITH PLAN OF CARE: Patient  PLAN FOR NEXT SESSION:   Check her status next week after wearing new orthosis in conjunction with wearing wrist  immobilization brace.  Check home program and if her motion and pain are both improved consider upgrading HEP carefully.  Continue on manual therapy and modalities as needed.   Fannie Knee, OTR/L 12/06/2022, 3:04 PM

## 2022-12-06 ENCOUNTER — Encounter: Payer: Self-pay | Admitting: Rehabilitative and Restorative Service Providers"

## 2022-12-06 ENCOUNTER — Ambulatory Visit (INDEPENDENT_AMBULATORY_CARE_PROVIDER_SITE_OTHER): Payer: Medicare Other | Admitting: Rehabilitative and Restorative Service Providers"

## 2022-12-06 DIAGNOSIS — M25632 Stiffness of left wrist, not elsewhere classified: Secondary | ICD-10-CM | POA: Diagnosis not present

## 2022-12-06 DIAGNOSIS — M6281 Muscle weakness (generalized): Secondary | ICD-10-CM | POA: Diagnosis not present

## 2022-12-06 DIAGNOSIS — R278 Other lack of coordination: Secondary | ICD-10-CM

## 2022-12-06 DIAGNOSIS — M25532 Pain in left wrist: Secondary | ICD-10-CM | POA: Diagnosis not present

## 2022-12-13 ENCOUNTER — Encounter: Payer: Medicare Other | Admitting: Rehabilitative and Restorative Service Providers"

## 2022-12-20 ENCOUNTER — Encounter: Payer: Medicare Other | Admitting: Rehabilitative and Restorative Service Providers"

## 2022-12-27 ENCOUNTER — Other Ambulatory Visit: Payer: Self-pay | Admitting: Internal Medicine

## 2023-01-08 ENCOUNTER — Encounter: Payer: Medicare Other | Admitting: Rehabilitative and Restorative Service Providers"

## 2023-01-16 ENCOUNTER — Encounter: Payer: Medicare Other | Admitting: Rehabilitative and Restorative Service Providers"

## 2023-01-17 ENCOUNTER — Encounter: Payer: Medicare Other | Admitting: Rehabilitative and Restorative Service Providers"

## 2023-01-21 ENCOUNTER — Ambulatory Visit: Payer: Medicare Other | Admitting: Obstetrics and Gynecology

## 2023-01-21 ENCOUNTER — Other Ambulatory Visit (HOSPITAL_BASED_OUTPATIENT_CLINIC_OR_DEPARTMENT_OTHER): Payer: Self-pay

## 2023-01-21 NOTE — Therapy (Signed)
OUTPATIENT OCCUPATIONAL THERAPY TREATMENT, PROGRESS & DISCHARGE NOTE  Patient Name: Lindsey Barrett MRN: [id number] DOB:11/11/1945, 77 y.o., female Today's Date: 01/23/2023  PCP: Eileen Stanford, MD REFERRING PROVIDER:  Rodolph Bong, MD       OCCUPATIONAL THERAPY DISCHARGE SUMMARY  Visits from Start of Care: 4   CLINICAL IMPRESSION: 01/23/23: Unfortunately she admits to not being faithful to all recommendations including home exercises and use of bracing and custom orthosis.  Due to that, she has not made much progress compared to start of care and this has been somewhat of a "vicious circle."  Unfortunately she did have some disruptions in care and has only been seen 4 times since July, but she does state understanding this illness better and knowing what to avoid-"just needing to do it."  When given options for injections or meeting with the surgeon, she states that she would like to just continue managing conservatively.  We will finish therapy today due to maximum potential gained through therapy at this time   PLAN:  OT FREQUENCY: & OT DURATION: 4 additional weeks and 1 visit to cover today's last treatment session. (From 12/28/22 - 01/23/23); but care is discharged after today.    Patient agrees to discharge due to max benefits received from outpatient occupational therapy / hand therapy at this time.   Fannie Knee, OTR/L, CHT 01/23/23       END OF SESSION:  OT End of Session - 01/23/23 1436     Visit Number 4    Number of Visits 8    Date for OT Re-Evaluation 12/28/22    Authorization Type Medicare    OT Start Time 1436    OT Stop Time 1529    OT Time Calculation (min) 53 min    Activity Tolerance Patient tolerated treatment well;Patient limited by fatigue;No increased pain;Patient limited by pain    Behavior During Therapy [facility] for tasks assessed/performed               Past Medical History:  Diagnosis Date   Anxiety    Atrophic vaginitis     Breast cancer (HCC)    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   Breast cyst    x3   DDD (degenerative disc disease)    Diverticulosis    Fasting hyperglycemia    GERD (gastroesophageal reflux disease)    Sullivan Lone syndrome    Glaucoma    History of hiatal hernia    HSV infection    Hyperlipidemia    Radiation    left breast 50.4 gray   STD (sexually transmitted disease)    HSV   Past Surgical History:  Procedure Laterality Date   APPENDECTOMY  1961   BREAST CYST ASPIRATION     x2 right ,x1 left   COLONOSCOPY  2007   neg   DILATION AND CURETTAGE OF UTERUS  1975   FLEXIBLE SIGMOIDOSCOPY     x2   PELVIC LAPAROSCOPY     RADIOACTIVE SEED GUIDED PARTIAL MASTECTOMY WITH AXILLARY SENTINEL LYMPH NODE BIOPSY Left 05/31/2015   Procedure: LEFT BREAST RADIOACTIVE SEED GUIDED LUMPECTOMY WITH LEFT AXILLARY SENTINEL LYMPH NODE BIOPSY;  Surgeon: Emelia Loron, MD;  Location: [facility];  Service: General;  Laterality: Left;   RE-EXCISION OF BREAST LUMPECTOMY Left 07/01/2015   Procedure: RE-EXCISION OF LEFT BREAST INFERIOR MARGIN;  Surgeon: Emelia Loron, MD;  Location: [facility];  Service: General;  Laterality: Left;   UPPER GASTROINTESTINAL ENDOSCOPY  UPPER GI ENDOSCOPY  10/2010   Dr Tori Milks TOOTH EXTRACTION  1978   Patient Active Problem List   Diagnosis Date Noted   DDD (degenerative disc disease), cervical 05/24/2022   Nevus of toe of right foot 03/27/2022   Venous insufficiency of both lower extremities 03/27/2022   Pain of left calf 03/27/2022   Aortic atherosclerosis (HCC) 03/27/2022   Dysuria 10/26/2021   Arthritis of carpometacarpal (CMC) joint of left thumb 10/18/2021   Rash 08/18/2021   Light headedness 03/29/2021   Dermatitis 12/09/2020   Low back pain 10/18/2020   COVID-19 08/31/2020   Patellofemoral arthritis of right knee 08/25/2020   Left wrist pain 04/19/2020   Left leg pain  04/18/2020   Acute pain of right knee 03/09/2020   Sensorineural hearing loss (SNHL) of both ears 03/20/2019   Deviated nasal septum 03/02/2019   Pulmonary nodule 07/29/2018   Dupuytren's contracture of both hands 07/11/2018   Ganglion cyst of volar aspect of left wrist 06/04/2018   Frozen shoulder 03/18/2018   Right rotator cuff tear 10/15/2017   Tinnitus of both ears 01/01/2017   Reaction to internal stress 05/29/2016   Prediabetes 08/10/2015   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   Open-angle glaucoma 06/17/2014   Vitamin D deficiency 10/18/2011   HSV infection    Atrophic vaginitis    VARICOSE VEINS, LOWER EXTREMITIES 09/28/2009   GERD 07/14/2009   Hyperlipidemia 12/26/2006   GILBERT'S SYNDROME 08/09/2006   DEGENERATIVE DISC DISEASE 08/09/2006    ONSET DATE: ~8 weeks onset   REFERRING DIAG: M25.532 (ICD-10-CM) - Left wrist pain   THERAPY DIAG:  Pain in left wrist  Stiffness of left wrist, not elsewhere classified  Other lack of coordination  Muscle weakness (generalized)  Rationale for Evaluation and Treatment: Rehabilitation   PERTINENT HISTORY: Per MD notes: " pt c/o L wrist pain x 6 wks. She is R-hand dominate. Pt was trying to put the back seat of the car and then noticed L wrist pain the next day. She will experiencing instances when the wrist will "catch" on her. Pain is worse first thing in the morning. Pt locates pain to the radial aspect of the and into the radial aspect of the forearm. " "Possible de Quervain's tenosynovitis in the setting of first CMC DJD and wrist effusion. X-ray images left hand obtained today personally and independently interpreted: Degenerative changes and possible avulsion at the first [facility].  Avulsion appears to be old.  No acute fractures present. Await formal radiology review."  Healthy and active. Several months ago, she states almost tripping and catching herself with Lt arm, which was painful.  After that she was struggling to push down a still car seat which exacerbated it. This was ~3 months ago per Pt. she was given a prefabricated wrist cock up brace by her doctor, but she states wearing it only intermittently and still performing some painful activities and routines.  She enjoys crochet and does 3-4 hours at a time. She is retired Psychologist, occupational.     PRECAUTIONS: None and Other: latex allergy  RED FLAGS: None   WEIGHT BEARING RESTRICTIONS: no but recommended <5# for 1-2 weeks in Lt hand    SUBJECTIVE:   SUBJECTIVE STATEMENT: She returns after no therapy for about 1 month. She states that she was doing better and listening to recommendations, doing home exercise program, until about a week or 2 ago when she got more active, stopped wearing her  braces, and stopped doing her exercises.  At that time some pain returned in her left wrist and thumb.  She does state seeming to understand this whole injury and illness process better now though.    PAIN:  Are you having pain?  Yes: NPRS scale:  0/10 at rest now but up to 4-5/10 in past week  Pain location: Lt 1st dorsal compartment Pain description: shooting pain at times  Aggravating factors: supination with wrist and thumb ext Relieving factors: rest, heat, ice  PATIENT GOALS: To improve pain and function with left hand and arm and learn the management strategies for this condition.    OBJECTIVE: (All objective assessments below are from initial evaluation on: 11/16/22 unless otherwise specified.)   HAND DOMINANCE: Right   ADLs: Overall ADLs: States decreased ability to grab, hold household objects, pain and inability to open containers, perform FMS tasks (manipulate fasteners on clothing), mild to moderate bathing problems as well.   FUNCTIONAL OUTCOME MEASURES: 01/23/23: Quick DASH 25% today  Eval: Quick DASH 27% impairment today  (Higher % Score  =  More Impairment)    UPPER EXTREMITY ROM     Shoulder to Wrist AROM  Left eval Lt 11/27/22 Lt 12/06/22 Lt 01/23/23  Forearm supination 80     Forearm pronation  90     Wrist flexion 56 64 65 38  Wrist extension 62 63 64 60  Wrist ulnar deviation 40 40    Wrist radial deviation 9 18    (Blank rows = not tested)   Hand AROM Left eval  Full Fist Ability (or Gap to Distal Palmar Crease) full  Thumb Opposition  (Kapandji Scale)  10/10  (Blank rows = not tested)   UPPER EXTREMITY MMT:     MMT Left 11/16/22 Lt 01/23/23  Forearm supination 5/5   Forearm pronation 4/5 pain 4/5 MMT  Wrist flexion 5/5   Wrist extension 5/5   Wrist ulnar deviation 5/5 pain   Wrist radial deviation 4-/5 4/5 tender  (Blank rows = not tested)  HAND FUNCTION:  01/23/23: Lt grip:  34#  11/27/22: Lt grip: 34#   Eval: Observed weakness in affected hand.  Grip strength Right: 45 lbs, Left: 30 lbs   EDEMA:   01/23/23: none apparent today   Eval:  Mildly swollen in Lt wrist today  OBSERVATIONS:   01/23/23: She still has some mild tender swelling in the first dorsal compartment of the left wrist, and she still seems to have the same symptoms of tenosynovitis when moving and radial deviation or pronation or too much thumb or wrist flexion.   TODAY'S TREATMENT:  01/23/23:  Pt performs AROM, gripping, and strength with Lt arm against therapist's resistance for exercise/activities as well as new measures today. OT checks custom hand based thumb orthosis and it is working well and fitting well, but she does not always wear it.  We discussed the use of her prefabricated brace versus her hand-based brace versus new options of using either Kinesiotape or an elastic glove to support her wrist and her thumb, and she states enjoying the elastic glove and will attempt to purchase one. OT also discusses home and functional tasks with the pt, which she does rate slightly improved since the start of care.  OT also reviews goals with her and also her full home exercise program and the purpose of  this treatment and how she should manage this overuse syndrome.  It is reiterated to her that she needs to rest  and side of the thumb brace or a wrist brace when very painful or to avoid pain in this first dorsal compartment, avoid provocative motions, perform gentle massages and use modalities as helpful, and that this is a process that will take some consistency and time. OT does these stretches with her today, and they are largely non-painful and make her feel better.  So far she admits to starting and stopping this process which has been a bit of a "vicious circle."  She does state understanding what she needs to do-she just needs to do it in her own words.  OT did again review the options of having an injection or even seeking a surgical release for this area, but she would rather continue to manage conservatively.  Exercises recommended to continue, non-painfully  - Wrist Prayer Stretch  - 3-4 x daily - 3-5 reps - 15 sec hold - Seated Wrist Flexion Stretch  - 3-4 x daily - 3 reps - 15 hold - Seated Composite Thumb Flexion PROM  - 2-3 x daily - 3-5 reps - 15 sec hold - Turn [employer] Facing Up & Down  - 3-4 x daily - 10 reps - Towel Roll Grip with Forearm in Neutral  - 3 x daily - 5 reps - 10 sec hold - Hammer Stretch   - 2-4 x daily - 1-2 sets - 10-15 reps   PATIENT EDUCATION: Education details: See tx section above for details  Person educated: Patient Education method: Verbal Instruction, Teach back, Handouts  Education comprehension: States and demonstrates understanding   HOME EXERCISE PROGRAM: Access Code: [email or web address] URL: [email or web address] Date: 11/16/2022 Prepared by: Fannie Knee   GOALS: Goals reviewed with patient? Yes   SHORT TERM GOALS: (STG required if POC>30 days) Target Date: 11/30/22  Pt will obtain protective, custom orthotic.  Goal status: 12/06/22: MET  2.  Pt will demo/state understanding of initial HEP to improve pain levels and prerequisite  motion. Goal status: 01/23/23: MET   LONG TERM GOALS: Target Date: 12/28/22  Pt will improve functional ability by decreased impairment per Quick DASH assessment from 27% to 10% or better, for better quality of life. Goal status: 01/23/23: NOT MET, 25% now   2.  Pt will improve grip strength in Lt hand from 30lbs to at least 40lbs for functional use at home and in IADLs. Goal status: 01/23/23: 34# now  3.  Pt will improve A/ROM in Lt wrist flex/ext from 56/62 to at least 65* both, to have functional motion for tasks like reach and grasp.  Goal status: 01/23/23: NOT met, now flexion somewhat decreased due to guarding behaviors  4.  Pt will improve strength in wrist radial deviation from painful 4-/5 MMT to at least 4+/5 MMT to have increased functional ability to carry out selfcare and higher-level homecare tasks with no difficulty. Goal status: 01/23/23: NOT met 4/5 now and still a bit tender  5.  Pt will decrease pain at worst from 4-5/10 to 2/10 or better to have better sleep and occupational participation in daily roles. Goal status: 01/23/23: NOT met   ASSESSMENT:  CLINICAL IMPRESSION: 01/23/23: Unfortunately she admits to not being faithful to all recommendations including home exercises and use of bracing and custom orthosis.  Due to that, she has not made much progress compared to start of care and this has been somewhat of a "vicious circle."  Unfortunately she did have some disruptions in care and has only been seen 4 times since July, but she does  state understanding this illness better and knowing what to avoid-"just needing to do it."  When given options for injections or meeting with the surgeon, she states that she would like to just continue managing conservatively.  We will finish therapy today due to maximum potential gained through therapy at this time   PLAN:  OT FREQUENCY: & OT DURATION: 4 additional weeks and 1 visit to cover today's last treatment session. (From 12/28/22 -  01/23/23); but care is discharged after today.   PLANNED INTERVENTIONS: self care/ADL training, therapeutic exercise, therapeutic activity, neuromuscular re-education, manual therapy, passive range of motion, splinting, ultrasound, fluidotherapy, compression bandaging, moist heat, cryotherapy, contrast bath, patient/family education, energy conservation, coping strategies training, and Dry needling  CONSULTED AND AGREED WITH PLAN OF CARE: Patient  PLAN FOR NEXT SESSION:   D/C today    Fannie Knee, OTR/L 01/23/2023, 6:12 PM

## 2023-01-23 ENCOUNTER — Encounter: Payer: Self-pay | Admitting: Rehabilitative and Restorative Service Providers"

## 2023-01-23 ENCOUNTER — Ambulatory Visit (INDEPENDENT_AMBULATORY_CARE_PROVIDER_SITE_OTHER): Payer: Medicare Other | Admitting: Rehabilitative and Restorative Service Providers"

## 2023-01-23 DIAGNOSIS — M6281 Muscle weakness (generalized): Secondary | ICD-10-CM

## 2023-01-23 DIAGNOSIS — R278 Other lack of coordination: Secondary | ICD-10-CM

## 2023-01-23 DIAGNOSIS — M25632 Stiffness of left wrist, not elsewhere classified: Secondary | ICD-10-CM

## 2023-01-23 DIAGNOSIS — M25532 Pain in left wrist: Secondary | ICD-10-CM

## 2023-01-25 ENCOUNTER — Telehealth: Payer: Self-pay | Admitting: Family Medicine

## 2023-01-25 NOTE — Telephone Encounter (Signed)
Patient called to let Dr Denyse Amass know that she has been released from PT. She said that she is not healed 100% but was given some guidance on what to do from home to continue.  She wanted to make sure that Dr Denyse Amass saw the last PT note and to advise on if she should be doing anything else.

## 2023-01-26 NOTE — Telephone Encounter (Signed)
Forwarding to Dr. Corey to review and advise.  

## 2023-01-28 NOTE — Telephone Encounter (Signed)
There are few places we could do an injection that could help.  Recommend return to clinic and consider injections.

## 2023-01-28 NOTE — Telephone Encounter (Signed)
Please reach out to pt to assist with scheduling appt to discuss different injections options (and to do injection if appropriate).  Thanks!

## 2023-01-28 NOTE — Telephone Encounter (Signed)
Left message for patient to callback if she would like to schedule an appointment.

## 2023-02-13 DIAGNOSIS — H524 Presbyopia: Secondary | ICD-10-CM | POA: Diagnosis not present

## 2023-02-13 DIAGNOSIS — H5213 Myopia, bilateral: Secondary | ICD-10-CM | POA: Diagnosis not present

## 2023-02-13 DIAGNOSIS — H2513 Age-related nuclear cataract, bilateral: Secondary | ICD-10-CM | POA: Diagnosis not present

## 2023-02-13 DIAGNOSIS — H40053 Ocular hypertension, bilateral: Secondary | ICD-10-CM | POA: Diagnosis not present

## 2023-02-13 DIAGNOSIS — H02831 Dermatochalasis of right upper eyelid: Secondary | ICD-10-CM | POA: Diagnosis not present

## 2023-02-13 DIAGNOSIS — H18593 Other hereditary corneal dystrophies, bilateral: Secondary | ICD-10-CM | POA: Diagnosis not present

## 2023-02-13 DIAGNOSIS — H52223 Regular astigmatism, bilateral: Secondary | ICD-10-CM | POA: Diagnosis not present

## 2023-03-21 ENCOUNTER — Encounter: Payer: Self-pay | Admitting: Gastroenterology

## 2023-05-03 ENCOUNTER — Encounter: Payer: Self-pay | Admitting: Internal Medicine

## 2023-05-03 ENCOUNTER — Telehealth: Payer: Self-pay | Admitting: Gastroenterology

## 2023-05-03 NOTE — Telephone Encounter (Signed)
Good afternoon, Dr. Adela Lank I had this patient call and state that she is due for a colonoscopy and was wishing to schedule. Due to patient age what would you recommend as far as scheduling? Should the patient be seen in the office or scheduled directly for a colonoscopy. Patient has mentioned she is not experiencing any GI symptoms. Please advise.

## 2023-05-05 NOTE — Telephone Encounter (Signed)
At age 77 okay to direct book for colonoscopy in the [facility] if she otherwise meets criteria for the [facility]. She is due for a surveillance exam. Thanks

## 2023-05-06 NOTE — Telephone Encounter (Signed)
I called patient and left a voicemail to call us back and schedule for a colonoscopy.

## 2023-05-22 DIAGNOSIS — H53453 Other localized visual field defect, bilateral: Secondary | ICD-10-CM | POA: Diagnosis not present

## 2023-05-22 DIAGNOSIS — H2513 Age-related nuclear cataract, bilateral: Secondary | ICD-10-CM | POA: Diagnosis not present

## 2023-05-22 DIAGNOSIS — H40053 Ocular hypertension, bilateral: Secondary | ICD-10-CM | POA: Diagnosis not present

## 2023-05-22 DIAGNOSIS — Z1231 Encounter for screening mammogram for malignant neoplasm of breast: Secondary | ICD-10-CM | POA: Diagnosis not present

## 2023-05-22 LAB — HM MAMMOGRAPHY

## 2023-06-13 ENCOUNTER — Encounter: Payer: Self-pay | Admitting: Internal Medicine

## 2023-06-13 NOTE — Patient Instructions (Addendum)
      Blood work was ordered.       Medications changes include :   None     Return in about 1 year (around 06/13/2024) for follow up.

## 2023-06-13 NOTE — Progress Notes (Signed)
 Subjective:    Patient ID: Lindsey Barrett, female    DOB: [birth year], 78 y.o.   MRN: [id number]     HPI Lindsey Barrett is here for follow up of her chronic medical problems.  Will schedule colonoscopy.   Off letrozole .  No longer seeing oncology.   Episode of lightheadedness when doing errands - she felt like she was going to pass out.  She may have been dehydrated.  Pin prick like pain in left side of head-transient intermittent  Right calf pain - leg felt cold.  Intermittent and has noticed it with certain movements.  She started yesterday.  Not exercising regularly.  She is very active.   Medications and allergies reviewed with patient and updated if appropriate.  Current Outpatient Medications on File Prior to Visit  Medication Sig Dispense Refill   aspirin  EC 81 MG tablet Take 1 tablet (81 mg total) by mouth daily.     bimatoprost (LUMIGAN) 0.01 % SOLN Place 1 drop into both eyes at bedtime.     brinzolamide (AZOPT) 1 % ophthalmic suspension 1 drop 2 (two) times daily.     calcium  carbonate (TUMS - DOSED IN MG ELEMENTAL CALCIUM ) 500 MG chewable tablet Chew 1 tablet by mouth as needed for indigestion or heartburn (1000 mg).      Carboxymethylcellulose Sodium (REFRESH LIQUIGEL OP) Apply 1 drop to eye 4 (four) times daily as needed.     Cholecalciferol (VITAMIN D3 PO) Take 5,000 Int'l Units by mouth daily.      [employer] SUNSCREEN SPF 30 EX apply topically to face and body daily for 30     ezetimibe  (ZETIA ) 10 MG tablet Take 1 tablet by mouth once daily 90 tablet 2   famotidine  (PEPCID ) 10 MG tablet Take 10 mg by mouth as needed.     ibuprofen  (ADVIL ) 200 MG tablet Take 200 mg by mouth every 6 (six) hours as needed.     rosuvastatin  (CRESTOR ) 40 MG tablet Take 1 tablet by mouth once daily 90 tablet 1   sodium chloride  (MURO 128) 5 % ophthalmic solution Place 1 drop into both eyes 2 (two) times a day. Before Azopt     Vaginal Lubricant (REPLENS) GEL Place 2 application   vaginally 2 (two) times a week.     [DISCONTINUED] metoprolol  tartrate (LOPRESSOR ) 25 MG tablet Take 1 tablet (25 mg total) by mouth as directed. 1 pill every 8 hours as needed for tachycardia or palpitations. 30 tablet 2   No current facility-administered medications on file prior to visit.     Review of Systems  Constitutional:  Negative for fever.  Respiratory:  Negative for cough, shortness of breath and wheezing.   Cardiovascular:  Positive for palpitations (occ) and leg swelling (controlled wtih compression socks). Negative for chest pain.  Neurological:  Negative for light-headedness and headaches.       Objective:   Vitals:   06/14/23 1511  BP: 120/70  Pulse: 77  SpO2: 97%   BP Readings from Last 3 Encounters:  06/14/23 120/70  06/14/23 120/70  10/24/22 (!) 144/70   Wt Readings from Last 3 Encounters:  06/14/23 164 lb (74.4 kg)  06/14/23 164 lb (74.4 kg)  10/24/22 168 lb (76.2 kg)   Body mass index is 28.15 kg/m.    Physical Exam Constitutional:      General: She is not in acute distress.    Appearance: Normal appearance.  HENT:     Head: Normocephalic and atraumatic.  Eyes:     Conjunctiva/sclera: Conjunctivae normal.  Cardiovascular:     Rate and Rhythm: Normal rate and regular rhythm.     Heart sounds: Normal heart sounds.  Pulmonary:     Effort: Pulmonary effort is normal. No respiratory distress.     Breath sounds: Normal breath sounds. No wheezing.  Abdominal:     General: There is no distension.     Palpations: Abdomen is soft.     Tenderness: There is no abdominal tenderness.  Musculoskeletal:     Cervical back: Neck supple.     Right lower leg: No edema.     Left lower leg: No edema.  Lymphadenopathy:     Cervical: No cervical adenopathy.  Skin:    General: Skin is warm and dry.     Findings: No rash.  Neurological:     Mental Status: She is alert. Mental status is at baseline.  Psychiatric:        Mood and Affect: Mood normal.         Behavior: Behavior normal.        Lab Results  Component Value Date   WBC 7.9 05/18/2022   HGB 14.2 05/18/2022   HCT 43.4 05/18/2022   PLT 251.0 05/18/2022   GLUCOSE 102 (H) 05/18/2022   CHOL 173 10/17/2022   TRIG 136 10/17/2022   HDL 52 10/17/2022   LDLDIRECT 148.9 09/23/2009   LDLCALC 97 10/17/2022   ALT 18 10/17/2022   AST 23 10/17/2022   NA 141 05/18/2022   K 4.0 05/18/2022   CL 104 05/18/2022   CREATININE 0.68 05/18/2022   BUN 12 05/18/2022   CO2 31 05/18/2022   TSH 1.79 10/18/2020   HGBA1C 6.4 05/18/2022   MICROALBUR 0.3 12/18/2006     Assessment & Plan:   Stressed good hydration routinely-episode of lightheadedness was likely related to dehydration and hypotension.  Right leg pain started yesterday and has been transient-likely either muscular or radiculopathy from the back-she will monitor and can see sports medicine if needed  Pinprick pain in left side of head likely nerve related from her neck-advised treating the neck if there is an issue  See Problem List for Assessment and Plan of chronic medical problems.

## 2023-06-14 ENCOUNTER — Ambulatory Visit (INDEPENDENT_AMBULATORY_CARE_PROVIDER_SITE_OTHER): Payer: Medicare Other | Admitting: Internal Medicine

## 2023-06-14 ENCOUNTER — Ambulatory Visit (INDEPENDENT_AMBULATORY_CARE_PROVIDER_SITE_OTHER): Payer: Medicare Other

## 2023-06-14 VITALS — BP 120/70 | HR 77 | Ht 64.0 in | Wt 164.0 lb

## 2023-06-14 DIAGNOSIS — Z0000 Encounter for general adult medical examination without abnormal findings: Secondary | ICD-10-CM | POA: Diagnosis not present

## 2023-06-14 DIAGNOSIS — Z1211 Encounter for screening for malignant neoplasm of colon: Secondary | ICD-10-CM | POA: Diagnosis not present

## 2023-06-14 DIAGNOSIS — E782 Mixed hyperlipidemia: Secondary | ICD-10-CM | POA: Diagnosis not present

## 2023-06-14 DIAGNOSIS — Z1212 Encounter for screening for malignant neoplasm of rectum: Secondary | ICD-10-CM | POA: Diagnosis not present

## 2023-06-14 DIAGNOSIS — R7303 Prediabetes: Secondary | ICD-10-CM

## 2023-06-14 NOTE — Assessment & Plan Note (Addendum)
 Chronic Lab Results  Component Value Date   HGBA1C 6.4 05/18/2022    Check a1c, CMP, CBC Low sugar / carb diet Stressed regular exercise-high activity

## 2023-06-14 NOTE — Assessment & Plan Note (Signed)
Chronic Regular exercise and healthy diet encouraged Check lipid panel, CMP, CBC Continue Crestor 40 mg daily, Zetia 10 mg daily

## 2023-06-14 NOTE — Patient Instructions (Addendum)
 [name] , Thank you for taking time to come for your Medicare Wellness Visit. I appreciate your ongoing commitment to your health goals. Please review the following plan we discussed and let me know if I can assist you in the future.   Referrals/Orders/Follow-Ups/Clinician Recommendations: It was nice to meet you today.  You are due for a Bone Density screening and a Colonoscopy.  Please call [facility] at [phone], to schedule your appointment.  Keep up the good work.  This is a list of the screening recommended for you and due dates:  Health Maintenance  Topic Date Due   Zoster (Shingles) Vaccine (1 of 2) Never done   Flu Shot  12/06/2022   COVID-19 Vaccine (5 - 2024-25 season) 01/06/2023   Colon Cancer Screening  02/13/2023   DEXA scan (bone density measurement)  02/16/2023   Mammogram  05/21/2024   Medicare Annual Wellness Visit  06/13/2024   DTaP/Tdap/Td vaccine (3 - Td or Tdap) 12/27/2031   Pneumonia Vaccine  Completed   Hepatitis C Screening  Completed   HPV Vaccine  Aged Out    Advanced directives: (In Chart) A copy of your advanced directives are scanned into your chart should your provider ever need it.  Next Medicare Annual Wellness Visit scheduled for next year: Yes

## 2023-06-14 NOTE — Progress Notes (Signed)
 Subjective:   Lindsey Barrett is a 78 y.o. female who presents for Medicare Annual (Subsequent) preventive examination.  Visit Complete: In person  Patient Medicare AWV questionnaire was completed by the patient on 06/12/2023; I have confirmed that all information answered by patient is correct and no changes since this date.  Cardiac Risk Factors include: advanced age (>64men, >58 women);Other (see comment);dyslipidemia, Risk factor comments: Aortic atherosclerosis     Objective:    Today's Vitals   06/14/23 1422  BP: 120/70  Pulse: 77  SpO2: 97%  Weight: 164 lb (74.4 kg)  Height: 5' 4 (1.626 m)   Body mass index is 28.15 kg/m.     06/14/2023    2:19 PM 11/16/2022   12:03 PM 05/11/2022   12:50 PM 06/14/2021    5:38 PM 04/24/2021    3:27 PM 10/31/2020   11:35 AM 02/15/2020    4:41 PM  Advanced Directives  Does Patient Have a Medical Advance Directive? Yes No Yes No Yes No Yes  Type of Estate Agent of [place];Living will  Healthcare Power of [place];Living will  Healthcare Power of [place];Living will    Does patient want to make changes to medical advance directive? No - Patient declined  No - Patient declined    No - Patient declined  Copy of Healthcare Power of Attorney in Chart? Yes - validated most recent copy scanned in chart (See row information)  Yes - validated most recent copy scanned in chart (See row information)  Yes - validated most recent copy scanned in chart (See row information)    Would patient like information on creating a medical advance directive?  No - Patient declined  No - Patient declined  No - Patient declined     Current Medications (verified) Outpatient Encounter Medications as of 06/14/2023  Medication Sig   aspirin  EC 81 MG tablet Take 1 tablet (81 mg total) by mouth daily.   bimatoprost (LUMIGAN) 0.01 % SOLN Place 1 drop into both eyes at bedtime.   brinzolamide (AZOPT) 1 % ophthalmic suspension 1 drop 2 (two) times  daily.   calcium  carbonate (TUMS - DOSED IN MG ELEMENTAL CALCIUM ) 500 MG chewable tablet Chew 1 tablet by mouth as needed for indigestion or heartburn (1000 mg).    Carboxymethylcellulose Sodium (REFRESH LIQUIGEL OP) Apply 1 drop to eye 4 (four) times daily as needed.   Cholecalciferol (VITAMIN D3 PO) Take 5,000 Int'l Units by mouth daily.    [employer] SUNSCREEN SPF 30 EX apply topically to face and body daily for 30   ezetimibe  (ZETIA ) 10 MG tablet Take 1 tablet by mouth once daily   famotidine  (PEPCID ) 10 MG tablet Take 10 mg by mouth as needed.   ibuprofen  (ADVIL ) 200 MG tablet Take 200 mg by mouth every 6 (six) hours as needed.   rosuvastatin  (CRESTOR ) 40 MG tablet Take 1 tablet by mouth once daily   sodium chloride  (MURO 128) 5 % ophthalmic solution Place 1 drop into both eyes 2 (two) times a day. Before Azopt   Vaginal Lubricant (REPLENS) GEL Place 2 application  vaginally 2 (two) times a week.   [DISCONTINUED] metoprolol  tartrate (LOPRESSOR ) 25 MG tablet Take 1 tablet (25 mg total) by mouth as directed. 1 pill every 8 hours as needed for tachycardia or palpitations.   No facility-administered encounter medications on file as of 06/14/2023.    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   Breast cyst    x3   DDD (degenerative disc disease)    Diverticulosis    Fasting hyperglycemia    GERD (gastroesophageal reflux disease)    Bertrum syndrome    Glaucoma    History of hiatal hernia    HSV infection    Hyperlipidemia    Radiation    left breast 50.4 gray   STD (sexually transmitted disease)    HSV   Past Surgical History:  Procedure Laterality Date    APPENDECTOMY  1961   BREAST CYST ASPIRATION     x2 right ,x1 left   COLONOSCOPY  2007   neg   DILATION AND CURETTAGE OF UTERUS  1975   FLEXIBLE SIGMOIDOSCOPY     x2   PELVIC LAPAROSCOPY     RADIOACTIVE SEED GUIDED PARTIAL MASTECTOMY WITH AXILLARY SENTINEL LYMPH NODE BIOPSY Left 05/31/2015   Procedure: LEFT BREAST RADIOACTIVE SEED GUIDED LUMPECTOMY WITH LEFT AXILLARY SENTINEL LYMPH NODE BIOPSY;  Surgeon: Donnice Bury, MD;  Location: [facility];  Service: General;  Laterality: Left;   RE-EXCISION OF BREAST LUMPECTOMY Left 07/01/2015   Procedure: RE-EXCISION OF LEFT BREAST INFERIOR MARGIN;  Surgeon: Donnice Bury, MD;  Location: [facility];  Service: General;  Laterality: Left;   UPPER GASTROINTESTINAL ENDOSCOPY     UPPER GI ENDOSCOPY  10/2010   Dr Obie QUERY TOOTH EXTRACTION  1978   Family History  Problem Relation Age of Onset   Lung cancer Mother        smoker   Endometriosis Mother    Cancer Mother        Bladder cancer   Stomach cancer Maternal Grandmother    Coronary artery disease Father    Cancer Father 64       intestinal carcinoid tumors of the ileum   Sudden death Sister        27 week old, congenital deformity   Liver cancer Maternal Uncle    Stroke Paternal Grandfather 60   Leukemia Maternal Uncle    Breast cancer Paternal Aunt        great paternal aunt-Age 5's   Colon cancer Paternal Aunt        lived to 35; pat great aunt   Vascular Disease Paternal Grandmother        thoracic aneurysm   Esophageal cancer Neg Hx    Rectal cancer Neg Hx    Pancreatic cancer Neg Hx    Social History   Socioeconomic History   Marital status: Divorced    Spouse name: Not on file   Number of children: 0   Years of education: Not on file   Highest education level: Master's degree (e.g., MA, MS, MEng, MEd, MSW, MBA)  Occupational History   Occupation: Loss Adjuster, 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  Tobacco Use   Smoking status: Former     Current packs/day: 0.00    Types: Cigarettes    Quit date: 03/28/1999    Years since quitting: 24.2   Smokeless tobacco: Never   Tobacco comments:    smoked 1967-2000, up to 3/4 ppd  Vaping Use   Vaping status: Never Used  Substance and Sexual Activity   Alcohol use: Yes    Alcohol/week: 10.0 standard drinks of alcohol  Types: 10 Cans of beer per week   Drug use: No   Sexual activity: Not Currently    Birth control/protection: Post-menopausal    Comment: 1st intercourse 78 yo-More than 5 partners  Other Topics Concern   Not on file  Social History Narrative   Occupation:  Loss Adjuster, Chartered for [employer] alone.      Regular exercise-no   Social Drivers of Health   Financial Resource Strain: Low Risk  (06/12/2023)   Overall Financial Resource Strain (CARDIA)    Difficulty of Paying Living Expenses: Not hard at all  Food Insecurity: No Food Insecurity (06/12/2023)   Hunger Vital Sign    Worried About Running Out of Food in the Last Year: Never true    Ran Out of Food in the Last Year: Never true  Transportation Needs: No Transportation Needs (06/12/2023)   PRAPARE - Administrator, Civil Service (Medical): No    Lack of Transportation (Non-Medical): No  Physical Activity: Insufficiently Active (06/12/2023)   Exercise Vital Sign    Days of Exercise per Week: 3 days    Minutes of Exercise per Session: 20 min  Stress: Stress Concern Present (06/12/2023)   [employer] of Occupational Health - Occupational Stress Questionnaire    Feeling of Stress : To some extent  Social Connections: Moderately Integrated (06/12/2023)   Social Connection and Isolation Panel [NHANES]    Frequency of Communication with Friends and Family: More than three times a week    Frequency of Social Gatherings with Friends and Family: More than three times a week    Attends Religious Services: More than 4 times per year    Active Member of [employer] or Organizations: Yes    Attends Museum/gallery Exhibitions Officer: More than 4 times per year    Marital Status: Divorced    Tobacco Counseling Counseling given: Not Answered Tobacco comments: smoked 1967-2000, up to 3/4 ppd   Clinical Intake:  Pre-visit preparation completed: Yes  Pain : No/denies pain     BMI - recorded: 28.15 Nutritional Status: BMI 25 -29 Overweight  How often do you need to have someone help you when you read instructions, pamphlets, or other written materials from your doctor or pharmacy?: 1 - Never  Interpreter Needed?: No  Information entered by :: Traci Gafford, RMA   Activities of Daily Living    06/12/2023   11:10 AM  In your present state of health, do you have any difficulty performing the following activities:  Hearing? 0  Vision? 0  Difficulty concentrating or making decisions? 0  Walking or climbing stairs? 0  Dressing or bathing? 0  Doing errands, shopping? 0  Preparing Food and eating ? N  Using the Toilet? N  In the past six months, have you accidently leaked urine? N  Do you have problems with loss of bowel control? N  Managing your Medications? N  Managing your Finances? N  Housekeeping or managing your Housekeeping? N    Patient Care Team: Geofm Glade PARAS, MD as PCP - General (Internal Medicine) Burnard Debby LABOR, MD as PCP - Cardiology (Cardiology) Fontaine, Evalene SQUIBB, MD (Inactive) as Consulting Physician (Gynecology) Armbruster, Elspeth SQUIBB, MD as Consulting Physician (Gastroenterology) [facility] Golas, MD as Consulting Physician (Ophthalmology) Szabat, Toribio BROCKS, [facility] (Inactive) as Pharmacist (Pharmacist) Duke, Jon Garre, PA as Physician Assistant (Cardiology)  Indicate any recent Medical Services you may have received from other than [facility] providers in the past year (  date may be approximate).     Assessment:   This is a routine wellness examination for Britiny.  Hearing/Vision screen Hearing Screening - Comments:: Tinnitus in both ears Vision Screening -  Comments:: Wears eyeglasses   Goals Addressed               This Visit's Progress     My goal for 2024 is to exercise more at [facility] [employer].   Not on track     Patient Stated (pt-stated)        Would like stop using sugar and salt.      Depression Screen    06/14/2023    2:42 PM 05/11/2022    1:50 PM 05/11/2022   12:56 PM 03/27/2022    1:45 PM 10/26/2021    1:49 PM 04/24/2021    3:31 PM 04/24/2021    3:22 PM  PHQ 2/9 Scores  PHQ - 2 Score 3 0 0 0 0 0 0  PHQ- 9 Score 4          Fall Risk    06/12/2023   11:10 AM 05/11/2022    1:50 PM 05/11/2022   12:51 PM 05/10/2022    2:41 PM 03/27/2022    1:45 PM  Fall Risk   Falls in the past year? 0 0 0 0 0  Number falls in past yr:  0 0 0 0  Injury with Fall?  0 0 0 0  Risk for fall due to :  No Fall Risks No Fall Risks  No Fall Risks  Follow up Falls evaluation completed;Falls prevention discussed Falls evaluation completed Falls prevention discussed  Falls evaluation completed    MEDICARE RISK AT HOME: Medicare Risk at Home Any stairs in or around the home?: (Patient-Rptd) Yes If so, are there any without handrails?: (Patient-Rptd) Yes Home free of loose throw rugs in walkways, pet beds, electrical cords, etc?: (Patient-Rptd) No Adequate lighting in your home to reduce risk of falls?: (Patient-Rptd) Yes Life alert?: (Patient-Rptd) No Use of a cane, walker or w/c?: (Patient-Rptd) No Grab bars in the bathroom?: (Patient-Rptd) Yes Shower chair or bench in shower?: (Patient-Rptd) Yes Elevated toilet seat or a handicapped toilet?: (Patient-Rptd) No  TIMED UP AND GO:  Was the test performed?  Yes  Length of time to ambulate 10 feet: 12 sec Gait steady and fast without use of assistive device    Cognitive Function:    05/16/2022    2:57 PM  MMSE - Mini Mental State Exam  Orientation to time 5  Orientation to Place 5  Registration 3  Attention/ Calculation 5  Recall 3  Language- name 2 objects 2  Language- repeat 1  Language-  follow 3 step command 3  Language- read & follow direction 1  Write a sentence 1  Copy design 1  Total score 30        06/14/2023    2:19 PM 05/11/2022   12:51 PM 02/15/2020    4:45 PM 01/16/2019    2:50 PM  6CIT Screen  What Year? 0 points 0 points 0 points   What month? 0 points 0 points 0 points 0 points  What time? 0 points 0 points 0 points 0 points  Count back from 20 0 points 0 points 0 points 0 points  Months in reverse 0 points 0 points 0 points 0 points  Repeat phrase 0 points 0 points 0 points 0 points  Total Score 0 points 0 points 0 points  Immunizations Immunization History  Administered Date(s) Administered   Fluad Quad(high Dose 65+) 01/22/2019, 02/15/2020, 04/25/2021, 03/08/2022   Hepatitis A, Adult 02/17/2016, 07/11/2018, 01/16/2019   Influenza Split 04/18/2011   Influenza, High Dose Seasonal PF 05/26/2013, 01/27/2016, 02/26/2017, 02/05/2018   Influenza, Seasonal, Injecte, Preservative Fre 04/17/2012   PFIZER Comirnaty(Gray Top)Covid-19 Tri-Sucrose Vaccine 12/13/2020   PFIZER(Purple Top)SARS-COV-2 Vaccination 06/12/2019, 07/07/2019, 03/19/2020   Pneumococcal Conjugate-13 02/17/2016   Pneumococcal Polysaccharide-23 07/02/2012   Tdap 08/02/2010, 12/26/2021    TDAP status: Up to date  Flu Vaccine status: Declined, Education has been provided regarding the importance of this vaccine but patient still declined. Advised may receive this vaccine at local pharmacy or Health Dept. Aware to provide a copy of the vaccination record if obtained from local pharmacy or Health Dept. Verbalized acceptance and understanding.  Pneumococcal vaccine status: Up to date  Covid-19 vaccine status: Declined, Education has been provided regarding the importance of this vaccine but patient still declined. Advised may receive this vaccine at local pharmacy or Health Dept.or vaccine clinic. Aware to provide a copy of the vaccination record if obtained from local pharmacy or Health  Dept. Verbalized acceptance and understanding.  Qualifies for Shingles Vaccine? Yes   Zostavax completed No   Shingrix Completed?: No.    Education has been provided regarding the importance of this vaccine. Patient has been advised to call insurance company to determine out of pocket expense if they have not yet received this vaccine. Advised may also receive vaccine at local pharmacy or Health Dept. Verbalized acceptance and understanding.  Screening Tests Health Maintenance  Topic Date Due   Zoster Vaccines- Shingrix (1 of 2) Never done   COVID-19 Vaccine (5 - 2024-25 season) 01/06/2023   Colonoscopy  02/13/2023   DEXA SCAN  02/16/2023   INFLUENZA VACCINE  08/05/2023 (Originally 12/06/2022)   MAMMOGRAM  05/21/2024   Medicare Annual Wellness (AWV)  06/13/2024   DTaP/Tdap/Td (3 - Td or Tdap) 12/27/2031   Pneumonia Vaccine 13+ Years old  Completed   Hepatitis C Screening  Completed   HPV VACCINES  Aged Out    Health Maintenance  Health Maintenance Due  Topic Date Due   Zoster Vaccines- Shingrix (1 of 2) Never done   COVID-19 Vaccine (5 - 2024-25 season) 01/06/2023   Colonoscopy  02/13/2023   DEXA SCAN  02/16/2023    Colorectal cancer screening: Referral to GI placed 06/14/23. Pt aware the office will call re: appt.  Mammogram status: Completed 05/22/2023. Repeat every year   Lung Cancer Screening: (Low Dose CT Chest recommended if Age 88-80 years, 20 pack-year currently smoking OR have quit w/in 15years.) does not qualify.   Lung Cancer Screening Referral: N/A  Additional Screening:  Hepatitis C Screening: does qualify; Completed 08/10/2015  Vision Screening: Recommended annual ophthalmology exams for early detection of glaucoma and other disorders of the eye. Is the patient up to date with their annual eye exam?  Yes  Who is the provider or what is the name of the office in which the patient attends annual eye exams? [facility] If pt is not established with a provider, would  they like to be referred to a provider to establish care? No .   Dental Screening: Recommended annual dental exams for proper oral hygiene    Community Resource Referral / Chronic Care Management: CRR required this visit?  No   CCM required this visit?  No     Plan:     I have personally reviewed and noted the  following in the patient's chart:   Medical and social history Use of alcohol, tobacco or illicit drugs  Current medications and supplements including opioid prescriptions. Patient is not currently taking opioid prescriptions. Functional ability and status Nutritional status Physical activity Advanced directives List of other physicians Hospitalizations, surgeries, and ER visits in previous 12 months Vitals Screenings to include cognitive, depression, and falls Referrals and appointments  In addition, I have reviewed and discussed with patient certain preventive protocols, quality metrics, and best practice recommendations. A written personalized care plan for preventive services as well as general preventive health recommendations were provided to patient.     Carrieanne Kleen L Cassaundra Rasch, CMA   06/14/2023   After Visit Summary: (MyChart) Due to this being a telephonic visit, the after visit summary with patients personalized plan was offered to patient via MyChart   Nurse Notes: Patient is due for a colonoscopy and a DEXA.  Her last DEXA was 02/16/2020, order by her GYN.  She declines Covid, Flu and Shingrix vaccines.  Patient had no other concerns to address with me today.

## 2023-06-20 ENCOUNTER — Encounter: Payer: Self-pay | Admitting: Obstetrics and Gynecology

## 2023-06-20 ENCOUNTER — Ambulatory Visit: Payer: Medicare Other | Admitting: Obstetrics and Gynecology

## 2023-06-20 VITALS — BP 120/68 | HR 77 | Ht 63.25 in | Wt 163.0 lb

## 2023-06-20 DIAGNOSIS — N952 Postmenopausal atrophic vaginitis: Secondary | ICD-10-CM

## 2023-06-20 DIAGNOSIS — E2839 Other primary ovarian failure: Secondary | ICD-10-CM

## 2023-06-20 DIAGNOSIS — Z853 Personal history of malignant neoplasm of breast: Secondary | ICD-10-CM

## 2023-06-20 DIAGNOSIS — Z79818 Long term (current) use of other agents affecting estrogen receptors and estrogen levels: Secondary | ICD-10-CM | POA: Diagnosis not present

## 2023-06-20 DIAGNOSIS — B009 Herpesviral infection, unspecified: Secondary | ICD-10-CM | POA: Diagnosis not present

## 2023-06-20 DIAGNOSIS — N898 Other specified noninflammatory disorders of vagina: Secondary | ICD-10-CM

## 2023-06-20 DIAGNOSIS — Z01419 Encounter for gynecological examination (general) (routine) without abnormal findings: Secondary | ICD-10-CM

## 2023-06-20 DIAGNOSIS — Z9189 Other specified personal risk factors, not elsewhere classified: Secondary | ICD-10-CM

## 2023-06-20 DIAGNOSIS — Z9289 Personal history of other medical treatment: Secondary | ICD-10-CM | POA: Diagnosis not present

## 2023-06-20 LAB — WET PREP FOR TRICH, YEAST, CLUE

## 2023-06-20 NOTE — Telephone Encounter (Signed)
Order written up, authorized by provider, and faxed successfully to [facility] for DXA.

## 2023-06-20 NOTE — Progress Notes (Signed)
78 y.o. y.o. female here for annual exam. No LMP recorded. Patient is postmenopausal.     [id number] Divorced White or Caucasian Not Hispanic or Latino female here for annual exam. No vaginal bleeding.   H/O left breast cancer, on letrozol until next July (will be 7 years)  She uses replens on occasional for vaginal dryness. Not sexually active.    H/O HSV, gets an outbreak with stress. Doesn't want medication.  H/o left labial plasty  in office.  Reports she rubbed it and is wondering if there is skin again. Reports vaginal discharge as well.  No LMP recorded. Patient is postmenopausal.          Sexually active: No.  The current method of family planning is post menopausal status.    Exercising: Yes.    Walking  Smoker:  no  Health Maintenance: Pap:  12/12/17 WNL, 01/13/15 WNL  History of abnormal Pap:  no MMG:  05/22/23 Bi-rads 2 benign  BMD:   03/02/20 normal  Colonoscopy: 02/13/16 polyp f/u 7 years  TDaP:  12/26/21 Gardasil: n/a Body mass index is 28.65 kg/m.     06/14/2023    2:42 PM 05/11/2022    1:50 PM 05/11/2022   12:56 PM  Depression screen PHQ 2/9  Decreased Interest 0 0 0  Down, Depressed, Hopeless 3 0 0  PHQ - 2 Score 3 0 0  Altered sleeping 0    Tired, decreased energy 0    Change in appetite 1    Feeling bad or failure about yourself  0    Trouble concentrating 0    Moving slowly or fidgety/restless 0    Suicidal thoughts 0    PHQ-9 Score 4    Difficult doing work/chores Not difficult at all      Blood pressure 120/68, pulse 77, height 5' 3.25" (1.607 m), weight 163 lb (73.9 kg), SpO2 99%.  No results found for: "DIAGPAP", "HPVHIGH", "ADEQPAP"  GYN HISTORY: No results found for: "DIAGPAP", "HPVHIGH", "ADEQPAP"  OB History  Gravida Para Term Preterm AB Living  0 0 0 0 0 0  SAB IAB Ectopic Multiple Live Births  0 0 0 0 0    Past Medical History:  Diagnosis Date   Anxiety    Atrophic vaginitis    Breast cancer (HCC)    Breast cancer of  upper-inner quadrant of left female breast (HCC) 05/20/2015   Breast cyst    x3   DDD (degenerative disc disease)    Diverticulosis    Fasting hyperglycemia    GERD (gastroesophageal reflux disease)    Sullivan Lone syndrome    Glaucoma    History of hiatal hernia    HSV infection    Hyperlipidemia    Radiation    left breast 50.4 gray   STD (sexually transmitted disease)    HSV    Past Surgical History:  Procedure Laterality Date   APPENDECTOMY  1961   BREAST CYST ASPIRATION     x2 right ,x1 left   COLONOSCOPY  2007   neg   DILATION AND CURETTAGE OF UTERUS  1975   FLEXIBLE SIGMOIDOSCOPY     x2   PELVIC LAPAROSCOPY     RADIOACTIVE SEED GUIDED PARTIAL MASTECTOMY WITH AXILLARY SENTINEL LYMPH NODE BIOPSY Left 05/31/2015   Procedure: LEFT BREAST RADIOACTIVE SEED GUIDED LUMPECTOMY WITH LEFT AXILLARY SENTINEL LYMPH NODE BIOPSY;  Surgeon: Emelia Loron, MD;  Location: [facility];  Service: General;  Laterality: Left;   RE-EXCISION  OF BREAST LUMPECTOMY Left 07/01/2015   Procedure: RE-EXCISION OF LEFT BREAST INFERIOR MARGIN;  Surgeon: Emelia Loron, MD;  Location: [facility];  Service: General;  Laterality: Left;   UPPER GASTROINTESTINAL ENDOSCOPY     UPPER GI ENDOSCOPY  10/2010   Dr Tori Milks TOOTH EXTRACTION  1978    Current Outpatient Medications on File Prior to Visit  Medication Sig Dispense Refill   aspirin EC 81 MG tablet Take 1 tablet (81 mg total) by mouth daily.     bimatoprost (LUMIGAN) 0.01 % SOLN Place 1 drop into both eyes at bedtime.     calcium carbonate (TUMS - DOSED IN MG ELEMENTAL CALCIUM) 500 MG chewable tablet Chew 1 tablet by mouth as needed for indigestion or heartburn (1000 mg).      Carboxymethylcellulose Sodium (REFRESH LIQUIGEL OP) Apply 1 drop to eye 4 (four) times daily as needed.     Cholecalciferol (VITAMIN D3 PO) Take 5,000 Int'l Units by mouth daily.      [employer] SUNSCREEN SPF 30 EX apply topically to face and body  daily for 30     dorzolamide (TRUSOPT) 2 % ophthalmic solution SMARTSIG:In Eye(s)     ezetimibe (ZETIA) 10 MG tablet Take 1 tablet by mouth once daily 90 tablet 2   famotidine (PEPCID) 10 MG tablet Take 10 mg by mouth as needed.     ibuprofen (ADVIL) 200 MG tablet Take 200 mg by mouth every 6 (six) hours as needed.     rosuvastatin (CRESTOR) 40 MG tablet Take 1 tablet by mouth once daily 90 tablet 1   sodium chloride (MURO 128) 5 % ophthalmic solution Place 1 drop into both eyes 2 (two) times a day. Before Azopt     Vaginal Lubricant (REPLENS) GEL Place 2 application  vaginally. As needed     [DISCONTINUED] metoprolol tartrate (LOPRESSOR) 25 MG tablet Take 1 tablet (25 mg total) by mouth as directed. 1 pill every 8 hours as needed for tachycardia or palpitations. 30 tablet 2   No current facility-administered medications on file prior to visit.    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  Tobacco Use   Smoking status: Former    Current packs/day: 0.00    Types: Cigarettes    Quit date: 03/28/1999    Years since quitting: 24.2   Smokeless tobacco: Never   Tobacco comments:    smoked 1967-2000, up to 3/4 ppd  Vaping Use   Vaping status: Never Used  Substance and Sexual Activity   Alcohol use: Yes    Alcohol/week: 10.0 standard drinks of alcohol    Types: 10 Cans of beer per week   Drug use: No   Sexual activity: Not Currently    Partners: Male    Birth control/protection: Post-menopausal    Comment: 1st intercourse 78 yo-More than 5 partners  Other Topics Concern   Not on file  Social History Narrative   Occupation:  Loss adjuster, 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      Regular exercise-no   Social Drivers of  Health   Financial Resource Strain: Low Risk  (06/12/2023)   Overall Financial Resource Strain (CARDIA)  Difficulty of Paying Living Expenses: Not hard at all  Food Insecurity: No Food Insecurity (06/12/2023)   Hunger Vital Sign    Worried About Running Out of Food in the Last Year: Never true    Ran Out of Food in the Last Year: Never true  Transportation Needs: No Transportation Needs (06/12/2023)   PRAPARE - Administrator, Civil Service (Medical): No    Lack of Transportation (Non-Medical): No  Physical Activity: Insufficiently Active (06/12/2023)   Exercise Vital Sign    Days of Exercise per Week: 3 days    Minutes of Exercise per Session: 20 min  Stress: Stress Concern Present (06/12/2023)   [employer] of Occupational Health - Occupational Stress Questionnaire    Feeling of Stress : To some extent  Social Connections: Moderately Integrated (06/12/2023)   Social Connection and Isolation Panel [NHANES]    Frequency of Communication with Friends and Family: More than three times a week    Frequency of Social Gatherings with Friends and Family: More than three times a week    Attends Religious Services: More than 4 times per year    Active Member of [employer] or Organizations: Yes    Attends Engineer, structural: More than 4 times per year    Marital Status: Divorced  Intimate Partner Violence: Not At Risk (04/24/2021)   Humiliation, Afraid, Rape, and Kick questionnaire    Fear of Current or Ex-Partner: No    Emotionally Abused: No    Physically Abused: No    Sexually Abused: No    Family History  Problem Relation Age of Onset   Lung cancer Mother        smoker   Endometriosis Mother    Cancer Mother        Bladder cancer   Stomach cancer Maternal Grandmother    Coronary artery disease Father    Cancer Father 81       intestinal carcinoid tumors of the ileum   Sudden death Sister        34 week old, congenital deformity   Liver cancer Maternal Uncle     Stroke Paternal Grandfather 56   Leukemia Maternal Uncle    Breast cancer Paternal Aunt        great paternal aunt-Age 66's   Colon cancer Paternal Aunt        lived to 4; pat great aunt   Vascular Disease Paternal Grandmother        thoracic aneurysm   Esophageal cancer Neg Hx    Rectal cancer Neg Hx    Pancreatic cancer Neg Hx      Allergies  Allergen Reactions   Arimidex [Anastrozole] Other (See Comments)    Unable to function, loopy, dizzy, and mentally fogginess   Latex Rash and Other (See Comments)    Gloves, Band-Aids    Septra Ds [Sulfamethoxazole-Trimethoprim] Hives   Epinephrine Other (See Comments)   Sulfa Antibiotics Hives   Sulfur Other (See Comments)   Adhesive [Tape] Rash   Augmentin [Amoxicillin-Pot Clavulanate] Diarrhea      Patient's last menstrual period was No LMP recorded. Patient is postmenopausal..            Review of Systems Alls systems reviewed and are negative.     Physical Exam Constitutional:      Appearance: Normal appearance.  Genitourinary:     Vulva and urethral meatus normal.     No lesions in the vagina.     Genitourinary Comments:  Normal symmetric labia majora no redundant skin. No erythema or lesions     Right Labia: No rash, lesions or skin changes.    Left Labia: No lesions, skin changes or rash.    No vaginal discharge or tenderness.     No vaginal prolapse present.    Severe vaginal atrophy present.     Right Adnexa: not tender, not palpable and no mass present.    Left Adnexa: not tender, not palpable and no mass present.    No cervical motion tenderness or discharge.     Uterus is not enlarged, tender or irregular.  Breasts:    Right: Normal.     Left: Normal.  HENT:     Head: Normocephalic.  Neck:     Thyroid: No thyroid mass, thyromegaly or thyroid tenderness.  Cardiovascular:     Rate and Rhythm: Normal rate and regular rhythm.     Heart sounds: Normal heart sounds, S1 normal and S2 normal.  Pulmonary:      Effort: Pulmonary effort is normal.     Breath sounds: Normal breath sounds and air entry.  Abdominal:     General: There is no distension.     Palpations: Abdomen is soft. There is no mass.     Tenderness: There is no abdominal tenderness. There is no guarding or rebound.  Musculoskeletal:        General: Normal range of motion.     Cervical back: Full passive range of motion without pain, normal range of motion and neck supple. No tenderness.     Right lower leg: No edema.     Left lower leg: No edema.  Neurological:     Mental Status: She is alert.  Skin:    General: Skin is warm.  Psychiatric:        Mood and Affect: Mood normal.        Behavior: Behavior normal.        Thought Content: Thought content normal.  Vitals and nursing note reviewed. Exam conducted with a chaperone present.       A:         Well Woman GYN exam                             P:        Pap smear not indicated Encouraged annual mammogram screening Colon cancer screening up-to-date DXA ordered today Labs and immunizations to do with PMD Encouraged healthy lifestyle practices Encouraged Vit D and Calcium   No follow-ups on file.  Earley Favor

## 2023-06-21 ENCOUNTER — Encounter: Payer: Self-pay | Admitting: Cardiovascular Disease

## 2023-06-21 ENCOUNTER — Encounter: Payer: Self-pay | Admitting: Internal Medicine

## 2023-06-21 ENCOUNTER — Other Ambulatory Visit (INDEPENDENT_AMBULATORY_CARE_PROVIDER_SITE_OTHER): Payer: Medicare Other

## 2023-06-21 DIAGNOSIS — E782 Mixed hyperlipidemia: Secondary | ICD-10-CM | POA: Diagnosis not present

## 2023-06-21 DIAGNOSIS — R7303 Prediabetes: Secondary | ICD-10-CM

## 2023-06-21 LAB — CBC WITH DIFFERENTIAL/PLATELET
Basophils Absolute: 0 10*3/uL (ref 0.0–0.1)
Basophils Relative: 0.7 % (ref 0.0–3.0)
Eosinophils Absolute: 0.2 10*3/uL (ref 0.0–0.7)
Eosinophils Relative: 3.5 % (ref 0.0–5.0)
HCT: 42.1 % (ref 36.0–46.0)
Hemoglobin: 13.8 g/dL (ref 12.0–15.0)
Lymphocytes Relative: 41.2 % (ref 12.0–46.0)
Lymphs Abs: 2.6 10*3/uL (ref 0.7–4.0)
MCHC: 32.8 g/dL (ref 30.0–36.0)
MCV: 88 fL (ref 78.0–100.0)
Monocytes Absolute: 0.6 10*3/uL (ref 0.1–1.0)
Monocytes Relative: 9.9 % (ref 3.0–12.0)
Neutro Abs: 2.8 10*3/uL (ref 1.4–7.7)
Neutrophils Relative %: 44.7 % (ref 43.0–77.0)
Platelets: 229 10*3/uL (ref 150.0–400.0)
RBC: 4.79 Mil/uL (ref 3.87–5.11)
RDW: 14.1 % (ref 11.5–15.5)
WBC: 6.3 10*3/uL (ref 4.0–10.5)

## 2023-06-21 LAB — LIPID PANEL
Cholesterol: 154 mg/dL (ref 0–200)
HDL: 51.3 mg/dL (ref >39.00)
LDL Cholesterol: 86 mg/dL (ref 0–99)
NonHDL: 103.07
Total CHOL/HDL Ratio: 3
Triglycerides: 86 mg/dL (ref 0.0–149.0)
VLDL: 17.2 mg/dL (ref 0.0–40.0)

## 2023-06-21 LAB — COMPREHENSIVE METABOLIC PANEL
ALT: 18 U/L (ref 0–35)
AST: 23 U/L (ref 0–37)
Albumin: 4.2 g/dL (ref 3.5–5.2)
Alkaline Phosphatase: 46 U/L (ref 39–117)
BUN: 11 mg/dL (ref 6–23)
CO2: 31 meq/L (ref 19–32)
Calcium: 8.9 mg/dL (ref 8.4–10.5)
Chloride: 105 meq/L (ref 96–112)
Creatinine, Ser: 0.67 mg/dL (ref 0.40–1.20)
GFR: 85.17 mL/min (ref >60.00)
Glucose, Bld: 101 mg/dL — ABNORMAL HIGH (ref 70–99)
Potassium: 4.5 meq/L (ref 3.5–5.1)
Sodium: 142 meq/L (ref 135–145)
Total Bilirubin: 1.5 mg/dL — ABNORMAL HIGH (ref 0.2–1.2)
Total Protein: 6.7 g/dL (ref 6.0–8.3)

## 2023-06-21 LAB — TSH: TSH: 2.17 u[IU]/mL (ref 0.35–5.50)

## 2023-06-21 LAB — HEMOGLOBIN A1C: Hgb A1c MFr Bld: 6.3 % (ref 4.6–6.5)

## 2023-06-24 NOTE — Telephone Encounter (Signed)
 Routing FYI.

## 2023-06-25 ENCOUNTER — Other Ambulatory Visit: Payer: Self-pay

## 2023-06-25 MED ORDER — EZETIMIBE 10 MG PO TABS: 10.0000 mg | ORAL_TABLET | Freq: Every day | ORAL | 0 refills | Status: DC

## 2023-06-26 NOTE — Progress Notes (Unsigned)
Cardiology Office Note:    Date:  06/28/2023   ID:  Lindsey Barrett, DOB 08-30-1945, MRN [id number]  PCP:  Pincus Sanes, MD   [facility] Providers Cardiologist:  Bryan Lemma, MD Cardiology APP:  Marcelino Duster, [place]     Will need to switch cardiologists from Dr. Tresa Endo to Dr. Herbie Baltimore   Referring MD: Pincus Sanes, MD   Chief Complaint  Patient presents with   Follow-up    12 months.    History of Present Illness:    Lindsey Barrett is a 78 y.o. female with a hx of breast cancer, Gilbert's syndrome, hyperlipidemia, and presyncope.  She has a remote tobacco smoking history but quit in 2000.  She follows with pulmonology for a stable lung nodule.  She was referred to cardiology and was seen by Dr. Tresa Endo for episodes of presyncope.  Echocardiogram showed LVEF 65-70% with mild LVH, and no significant valvular disease.  A heart monitor worn for 13 days showed sinus rhythm with an average heart rate of 74 bpm, nadir 54 bpm, no episodes of atrial fibrillation, pauses, or ectopy.  She was last seen by Dr. Tresa Endo 02/28/2021 and was doing well at that time.  She continues to follow with oncology s/p lumpectomy and radiation therapy.  I saw her for routine cardiology follow up 04/2022. She reported symptoms consistent with dependent edema while on a cruise, DVT study was negative. She presents today for routine cardiology follow up. She states she has been stressed - had to put her cat down yesterday after 90 days in cat hospice. She had a tooth pulled end of January.   A1c improved to 6.3%, she is disappointed that it isn't lower. She has lost 14 lbs intentionally, but also stress. Thankfully no recent syncope/pre-syncope because she doesn't eat rich foods out.    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   Breast cyst    x3   DDD  (degenerative disc disease)    Diverticulosis    Fasting hyperglycemia    GERD (gastroesophageal reflux disease)    Sullivan Lone syndrome    Glaucoma    History of hiatal hernia    HSV infection    Hyperlipidemia    Radiation    left breast 50.4 gray   STD (sexually transmitted disease)    HSV    Past Surgical History:  Procedure Laterality Date   APPENDECTOMY  1961   BREAST CYST ASPIRATION     x2 right ,x1 left   COLONOSCOPY  2007   neg   DILATION AND CURETTAGE OF UTERUS  1975   FLEXIBLE SIGMOIDOSCOPY     x2   PELVIC LAPAROSCOPY     RADIOACTIVE SEED GUIDED PARTIAL MASTECTOMY WITH AXILLARY SENTINEL LYMPH NODE BIOPSY Left 05/31/2015   Procedure: LEFT BREAST RADIOACTIVE SEED GUIDED LUMPECTOMY WITH LEFT AXILLARY SENTINEL LYMPH NODE BIOPSY;  Surgeon: Emelia Loron, MD;  Location: [facility];  Service: General;  Laterality: Left;   RE-EXCISION OF BREAST LUMPECTOMY Left 07/01/2015   Procedure: RE-EXCISION OF LEFT BREAST INFERIOR MARGIN;  Surgeon: Emelia Loron, MD;  Location: [facility];  Service: General;  Laterality: Left;   UPPER GASTROINTESTINAL ENDOSCOPY     UPPER GI ENDOSCOPY  10/2010   Dr Tori Milks TOOTH EXTRACTION  1978  Current Medications: Current Meds  Medication Sig   aspirin EC 81 MG tablet Take 1 tablet (81 mg total) by mouth daily.   bimatoprost (LUMIGAN) 0.01 % SOLN Place 1 drop into both eyes at bedtime.   calcium carbonate (TUMS - DOSED IN MG ELEMENTAL CALCIUM) 500 MG chewable tablet Chew 1 tablet by mouth as needed for indigestion or heartburn (1000 mg).    Carboxymethylcellulose Sodium (REFRESH LIQUIGEL OP) Apply 1 drop to eye 4 (four) times daily as needed.   Cholecalciferol (VITAMIN D3 PO) Take 5,000 Int'l Units by mouth daily.    CVS SUNSCREEN SPF 30 EX apply topically to face and body daily for 30   dorzolamide (TRUSOPT) 2 % ophthalmic solution Place 1 drop into both eyes 2 (two) times daily.   ezetimibe (ZETIA) 10  MG tablet Take 1 tablet (10 mg total) by mouth daily.   famotidine (PEPCID) 10 MG tablet Take 10 mg by mouth as needed.   ibuprofen (ADVIL) 200 MG tablet Take 200 mg by mouth every 6 (six) hours as needed.   rosuvastatin (CRESTOR) 40 MG tablet Take 1 tablet by mouth once daily   sodium chloride (MURO 128) 5 % ophthalmic solution Place 1 drop into both eyes 2 (two) times a day. Before Azopt   Vaginal Lubricant (REPLENS) GEL Place 2 application  vaginally. 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  Tobacco Use   Smoking status: Former    Current packs/day: 0.00    Types: Cigarettes    Quit date: 03/28/1999    Years since quitting: 24.2   Smokeless tobacco: Never   Tobacco comments:    smoked 1967-2000, up to 3/4 ppd  Vaping Use   Vaping status: Never Used  Substance and Sexual Activity   Alcohol use: Yes    Alcohol/week: 10.0 standard drinks of alcohol    Types: 10 Cans of beer per week   Drug use: No   Sexual activity: Not Currently    Partners: Male    Birth control/protection: Post-menopausal    Comment: 1st intercourse 78 yo-More than 5 partners  Other Topics Concern   Not on file  Social History Narrative   Occupation:  Loss adjuster, 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      Regular exercise-no   Social Drivers of Health   Financial Resource Strain: Low Risk  (06/12/2023)   Overall Financial Resource Strain (CARDIA)    Difficulty of Paying Living Expenses: Not hard at all  Food Insecurity: No Food Insecurity  (06/12/2023)   Hunger Vital Sign    Worried About Running Out of Food in the Last Year: Never true    Ran Out of Food in the Last Year: Never true  Transportation Needs: No Transportation Needs (06/12/2023)   PRAPARE - Administrator, Civil Service (Medical): No    Lack of Transportation (Non-Medical): No  Physical Activity: Insufficiently Active (06/12/2023)   Exercise Vital Sign    Days of Exercise per Week: 3 days    Minutes of  Exercise per Session: 20 min  Stress: Stress Concern Present (06/12/2023)   [employer] of Occupational Health - Occupational Stress Questionnaire    Feeling of Stress : To some extent  Social Connections: Moderately Integrated (06/12/2023)   Social Connection and Isolation Panel [NHANES]    Frequency of Communication with Friends and Family: More than three times a week    Frequency of Social Gatherings with Friends and Family: More than three times a week    Attends Religious Services: More than 4 times per year    Active Member of [employer] or Organizations: Yes    Attends Engineer, structural: More than 4 times per year    Marital Status: Divorced     Family History: The patient's family history includes Breast cancer in her paternal aunt; Cancer in her mother; Cancer (age of onset: 70) in her father; Colon cancer in her paternal aunt; Coronary artery disease in her father; Endometriosis in her mother; Leukemia in her maternal uncle; Liver cancer in her maternal uncle; Lung cancer in her mother; Stomach cancer in her maternal grandmother; Stroke (age of onset: 41) in her paternal grandfather; Sudden death in her sister; Vascular Disease in her paternal grandmother. There is no history of Esophageal cancer, Rectal cancer, or Pancreatic cancer.  ROS:   Please see the history of present illness.     All other systems reviewed and are negative.  EKGs/Labs/Other Studies Reviewed:    The following studies were reviewed today:  Echo 11/2019:  1.  Left ventricular ejection fraction, by estimation, is 65 to 70%. The  left ventricle has normal function. The left ventricle has no regional  wall motion abnormalities. There is mild left ventricular hypertrophy.  Left ventricular diastolic parameters  were normal.   2. Right ventricular systolic function is normal. The right ventricular  size is normal. Tricuspid regurgitation signal is inadequate for assessing  PA pressure.   3. The mitral valve is normal in structure. No evidence of mitral valve  regurgitation.   4. The aortic valve is tricuspid. Aortic valve regurgitation is not  visualized. No aortic stenosis is present.   5. The inferior vena cava is normal in size with greater than 50%  respiratory variability, suggesting right atrial pressure of 3 mmHg   EKG Interpretation Date/Time:  Friday June 28 2023 10:31:11 EST Ventricular Rate:  71 PR Interval:  158 QRS Duration:  86 QT Interval:  416 QTC Calculation: 452 R Axis:   -30  Text Interpretation: Normal sinus rhythm Left axis deviation Cannot rule out Anterior infarct , age undetermined When compared with ECG of 25-Dec-2014 03:05, No significant change was found Confirmed by Micah Flesher ([id number]) on 06/28/2023 10:38:55 AM    Recent Labs: 06/21/2023: ALT 18; BUN 11; Creatinine, Ser 0.67; Hemoglobin 13.8; Platelets 229.0; Potassium 4.5; Sodium 142; TSH 2.17  Recent Lipid Panel    Component Value Date/Time   CHOL 154 06/21/2023 0955   CHOL 173 10/17/2022 0955   CHOL 216 (H) 06/18/2014 1053   TRIG 86.0 06/21/2023 0955   TRIG 110 06/18/2014 1053   HDL 51.30 06/21/2023 0955   HDL 52 10/17/2022 0955   HDL 52 06/18/2014 1053   CHOLHDL 3 06/21/2023 0955   VLDL 17.2 06/21/2023 0955   LDLCALC 86 06/21/2023 0955   LDLCALC 97 10/17/2022 0955   LDLCALC 142 (H) 06/18/2014 1053   LDLDIRECT 148.9 09/23/2009 0835     Risk Assessment/Calculations:  Physical Exam:    VS:  BP (!) 116/56 (BP Location: Right  Arm, Patient Position: Sitting, Cuff Size: Normal)   Pulse 71   Ht 5' 3.25" (1.607 m)   Wt 163 lb (73.9 kg)   BMI 28.65 kg/m     Wt Readings from Last 3 Encounters:  06/28/23 163 lb (73.9 kg)  06/20/23 163 lb (73.9 kg)  06/14/23 164 lb (74.4 kg)     GEN:  Well nourished, well developed in no acute distress HEENT: Normal NECK: No JVD; No carotid bruits LYMPHATICS: No lymphadenopathy CARDIAC: RRR, no murmurs, rubs, gallops RESPIRATORY:  Clear to auscultation without rales, wheezing or rhonchi  ABDOMEN: Soft, non-tender, non-distended MUSCULOSKELETAL:  No edema; No deformity  SKIN: Warm and dry NEUROLOGIC:  Alert and oriented x 3 PSYCHIATRIC:  Normal affect   ASSESSMENT:    1. Aortic atherosclerosis (HCC)   2. Mixed hyperlipidemia   3. Light headedness   4. Near syncope   5. Gastroesophageal reflux disease without esophagitis   6. Venous insufficiency of both lower extremities   7. Preoperative cardiovascular examination    PLAN:    In order of problems listed above:  Episodes of presyncope Presyncope following eating She has a history of possible postprandial transient hypotension versus postprandial vagus nerve dysfunction. Heart monitor was largely unrevealing as was her echocardiogram   Hyperlipidemia Continue 40 mg crestor and zetia, OTC fish oil Continue ASA 06/21/2023: Cholesterol 154; HDL 51.30; LDL Cholesterol 86; Triglycerides 86.0; VLDL 17.2   GERD Follows with Dr. Fritzi Mandes   Lower extremity swelling - sounds like venous insufficiency / dependent edema - denies claudication, but if symptoms would obtain ABIs   Preoperative risk evaluation for endoscopy According to the RCRI, she has a 0.6% risk of MACE.  She can complete more than 4.0 METS without angina. She may proceed with endoscopy without further testing.    Follow up in 6 months.            Medication Adjustments/Labs and Tests Ordered: Current medicines are reviewed at length  with the patient today.  Concerns regarding medicines are outlined above.  Orders Placed This Encounter  Procedures   EKG 12-Lead   No orders of the defined types were placed in this encounter.   Patient Instructions  Medication Instructions:  No changes.  *If you need a refill on your cardiac medications before your next appointment, please call your pharmacy*   Follow-Up: At [facility], you and your health needs are our priority.  As part of our continuing mission to provide you with exceptional heart care, we have created designated Provider Care Teams.  These Care Teams include your primary Cardiologist (physician) and Advanced Practice Providers (APPs -  Physician Assistants and Nurse Practitioners) who all work together to provide you with the care you need, when you need it.  We recommend signing up for the patient portal called "MyChart".  Sign up information is provided on this After Visit Summary.  MyChart is used to connect with patients for Virtual Visits (Telemedicine).  Patients are able to view lab/test results, encounter notes, upcoming appointments, etc.  Non-urgent messages can be sent to your provider as well.   To learn more about what you can do with MyChart, go to [email or web address].    Your next appointment:   6 month(s)  Provider:   Bryan Lemma, MD     Other Instructions         Signed, Marcelino Duster, [place]  06/28/2023  11:11 AM    [facility]

## 2023-06-28 ENCOUNTER — Encounter: Payer: Self-pay | Admitting: Physician Assistant

## 2023-06-28 ENCOUNTER — Ambulatory Visit: Payer: Medicare Other | Attending: Physician Assistant | Admitting: Physician Assistant

## 2023-06-28 VITALS — BP 116/56 | HR 71 | Ht 63.25 in | Wt 163.0 lb

## 2023-06-28 DIAGNOSIS — K219 Gastro-esophageal reflux disease without esophagitis: Secondary | ICD-10-CM

## 2023-06-28 DIAGNOSIS — Z01810 Encounter for preprocedural cardiovascular examination: Secondary | ICD-10-CM

## 2023-06-28 DIAGNOSIS — R42 Dizziness and giddiness: Secondary | ICD-10-CM | POA: Diagnosis not present

## 2023-06-28 DIAGNOSIS — I872 Venous insufficiency (chronic) (peripheral): Secondary | ICD-10-CM | POA: Diagnosis not present

## 2023-06-28 DIAGNOSIS — I700 Atherosclerosis of aorta: Secondary | ICD-10-CM

## 2023-06-28 DIAGNOSIS — R55 Syncope and collapse: Secondary | ICD-10-CM | POA: Diagnosis not present

## 2023-06-28 DIAGNOSIS — E782 Mixed hyperlipidemia: Secondary | ICD-10-CM | POA: Diagnosis not present

## 2023-06-28 NOTE — Patient Instructions (Signed)
Medication Instructions:  No changes.  *If you need a refill on your cardiac medications before your next appointment, please call your pharmacy*   Follow-Up: At [facility], you and your health needs are our priority.  As part of our continuing mission to provide you with exceptional heart care, we have created designated Provider Care Teams.  These Care Teams include your primary Cardiologist (physician) and Advanced Practice Providers (APPs -  Physician Assistants and Nurse Practitioners) who all work together to provide you with the care you need, when you need it.  We recommend signing up for the patient portal called "MyChart".  Sign up information is provided on this After Visit Summary.  MyChart is used to connect with patients for Virtual Visits (Telemedicine).  Patients are able to view lab/test results, encounter notes, upcoming appointments, etc.  Non-urgent messages can be sent to your provider as well.   To learn more about what you can do with MyChart, go to [email or web address].    Your next appointment:   6 month(s)  Provider:   Bryan Lemma, MD     Other Instructions

## 2023-06-30 ENCOUNTER — Encounter: Payer: Self-pay | Admitting: Cardiology

## 2023-07-04 DIAGNOSIS — N958 Other specified menopausal and perimenopausal disorders: Secondary | ICD-10-CM | POA: Diagnosis not present

## 2023-07-04 DIAGNOSIS — E2839 Other primary ovarian failure: Secondary | ICD-10-CM | POA: Diagnosis not present

## 2023-07-04 LAB — HM DEXA SCAN: HM Dexa Scan: NORMAL

## 2023-07-14 ENCOUNTER — Other Ambulatory Visit: Payer: Self-pay | Admitting: Internal Medicine

## 2023-07-19 DIAGNOSIS — H401131 Primary open-angle glaucoma, bilateral, mild stage: Secondary | ICD-10-CM | POA: Diagnosis not present

## 2023-07-19 DIAGNOSIS — H2513 Age-related nuclear cataract, bilateral: Secondary | ICD-10-CM | POA: Diagnosis not present

## 2023-07-19 DIAGNOSIS — H04123 Dry eye syndrome of bilateral lacrimal glands: Secondary | ICD-10-CM | POA: Diagnosis not present

## 2023-07-19 DIAGNOSIS — H35373 Puckering of macula, bilateral: Secondary | ICD-10-CM | POA: Diagnosis not present

## 2023-07-26 ENCOUNTER — Ambulatory Visit: Payer: Medicare Other | Admitting: Physician Assistant

## 2023-08-21 NOTE — Progress Notes (Unsigned)
 Celso Amy, PA-C [address] [place], [place]  [address] Phone: [phone]   Gastroenterology Consultation  Referring Provider:     Pincus Sanes, MD Primary Care Physician:  Pincus Sanes, MD Primary Gastroenterologist:  Celso Amy, PA-C / Ileene Patrick, MD  Reason for Consultation:  GERD, Hx Colon Polyps, Repeat Colonoscopy        HPI:   Lindsey Barrett is a 78 y.o. y/o female referred for consultation & management  by Pincus Sanes, MD.    Patient is here to schedule repeat surveillance colonoscopy due to history of adenomatous colon polyps.  Last colonoscopy 02/2016 by Dr. Adela Lank: 1 small 5 mm adenomatous polyp removed.  Good prep.  Mild diverticulosis.  7-year repeat.  Last EGD 10/2010 by Dr. Juanda Chance: Normal.  Biopsies at GE junction consistent with acid reflux.  No Barrett's.  Gastric biopsies negative for H. pylori or metaplasia.  Current symptoms: She has mild intermittent heartburn and acid reflux for many years.  She took Zantac many years ago which helped.  Then she was switched to Pepcid with benefit.  She was able to wean off Pepcid.  Now she only takes OTC Pepcid 10 mg once daily sporadically as needed.  She has greatly changed her diet and avoids certain trigger foods which cause heartburn.  She tries not to eat close to bedtime, which helps.  Occasionally takes Tums as needed.  She has an episode of acid reflux and belching once every few weeks, very infrequent.  She denies dysphagia, abdominal pain, weight loss, or any alarm symptoms.  Denies any lower GI symptoms such as diarrhea constipation, or rectal bleeding.   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   Breast cyst    x3   DDD (degenerative disc disease)    Diverticulosis    Fasting hyperglycemia    GERD (gastroesophageal reflux disease)    Sullivan Lone  syndrome    Glaucoma    History of hiatal hernia    HSV infection    Hyperlipidemia    Radiation    left breast 50.4 gray   STD (sexually transmitted disease)    HSV    Past Surgical History:  Procedure Laterality Date   APPENDECTOMY  1961   BREAST CYST ASPIRATION     x2 right ,x1 left   COLONOSCOPY  2007   neg   DILATION AND CURETTAGE OF UTERUS  1975   FLEXIBLE SIGMOIDOSCOPY     x2   PELVIC LAPAROSCOPY     RADIOACTIVE SEED GUIDED PARTIAL MASTECTOMY WITH AXILLARY SENTINEL LYMPH NODE BIOPSY Left 05/31/2015   Procedure: LEFT BREAST RADIOACTIVE SEED GUIDED LUMPECTOMY WITH LEFT AXILLARY SENTINEL LYMPH NODE BIOPSY;  Surgeon: Emelia Loron, MD;  Location: [facility];  Service: General;  Laterality: Left;   RE-EXCISION OF BREAST LUMPECTOMY Left 07/01/2015   Procedure: RE-EXCISION OF LEFT BREAST INFERIOR MARGIN;  Surgeon: Emelia Loron, MD;  Location: [facility];  Service: General;  Laterality: Left;   UPPER GASTROINTESTINAL ENDOSCOPY     UPPER GI ENDOSCOPY  10/2010   Dr Tori Milks TOOTH EXTRACTION  1978    Prior to Admission medications   Medication Sig Start Date End Date Taking? Authorizing Provider  aspirin EC 81 MG tablet Take 1 tablet (81 mg total) by  mouth daily. 09/15/19   Gudena, Vinay, MD  bimatoprost (LUMIGAN) 0.01 % SOLN Place 1 drop into both eyes at bedtime.    [provider]  calcium carbonate (TUMS - DOSED IN MG ELEMENTAL CALCIUM) 500 MG chewable tablet Chew 1 tablet by mouth as needed for indigestion or heartburn (1000 mg).     [provider]  Carboxymethylcellulose Sodium (REFRESH LIQUIGEL OP) Apply 1 drop to eye 4 (four) times daily as needed.    [provider]  Cholecalciferol (VITAMIN D3 PO) Take 5,000 Int'l Units by mouth daily.     [provider]  [employer] SUNSCREEN SPF 30 EX apply topically to face and body daily for 30    [provider]  dorzolamide (TRUSOPT) 2 % ophthalmic  solution Place 1 drop into both eyes 2 (two) times daily. 06/03/23   [provider]  ezetimibe (ZETIA) 10 MG tablet Take 1 tablet (10 mg total) by mouth daily. 06/25/23   Duke, Warren Haber, PA  famotidine (PEPCID) 10 MG tablet Take 10 mg by mouth as needed.    [provider]  ibuprofen (ADVIL) 200 MG tablet Take 200 mg by mouth every 6 (six) hours as needed.    [provider]  rosuvastatin (CRESTOR) 40 MG tablet Take 1 tablet by mouth once daily 07/16/23   Burns, Stacy J, MD  sodium chloride (MURO 128) 5 % ophthalmic solution Place 1 drop into both eyes 2 (two) times a day. Before Azopt    [provider]  Vaginal Lubricant (REPLENS) GEL Place 2 application  vaginally. As needed    [provider]  metoprolol tartrate (LOPRESSOR) 25 MG tablet Take 1 tablet (25 mg total) by mouth as directed. 1 pill every 8 hours as needed for tachycardia or palpitations. 01/02/11 10/29/19  Alinda Apley, MD    Family History  Problem Relation Age of Onset   Lung cancer Mother        smoker   Endometriosis Mother    Cancer Mother        Bladder cancer   Stomach cancer Maternal Grandmother    Coronary artery disease Father    Cancer Father 74       intestinal carcinoid tumors of the ileum   Sudden death Sister        16 week old, congenital deformity   Liver cancer Maternal Uncle    Stroke Paternal Grandfather 21   Leukemia Maternal Uncle    Breast cancer Paternal Aunt        great paternal aunt-Age 26's   Colon cancer Paternal Aunt        lived to 8; pat great aunt   Vascular Disease Paternal Grandmother        thoracic aneurysm   Esophageal cancer Neg Hx    Rectal cancer Neg Hx    Pancreatic cancer Neg Hx      Social History   Tobacco Use   Smoking status: Former    Current packs/day: 0.00    Types: Cigarettes    Quit date: 03/28/1999    Years since quitting: 24.4   Smokeless tobacco: Never   Tobacco comments:    smoked 1967-2000, up to  3/4 ppd  Vaping Use   Vaping status: Never Used  Substance Use Topics   Alcohol use: Yes    Alcohol/week: 10.0 standard drinks of alcohol    Types: 10 Cans of beer per week   Drug use: No    Allergies as  of 08/22/2023 - Review Complete 08/22/2023  Allergen Reaction Noted   Arimidex [anastrozole] Other (See Comments) 12/01/2015   Latex Rash and Other (See Comments) 02/28/2021   Septra ds [sulfamethoxazole-trimethoprim] Hives 10/21/2014   Epinephrine Other (See Comments) 03/27/2022   Sulfa antibiotics Hives 02/28/2017   Sulfur Other (See Comments) 03/27/2022   Adhesive [tape] Rash 05/31/2015   Augmentin [amoxicillin-pot clavulanate] Diarrhea 10/10/2020    Review of Systems:    All systems reviewed and negative except where noted in HPI.   Physical Exam:  BP 110/72 (Cuff Size: Normal)   Pulse 69   Ht 5' 3.25" (1.607 m)   Wt 165 lb 12.8 oz (75.2 kg)   BMI 29.14 kg/m  No LMP recorded. Patient is postmenopausal.  General:   Alert,  Well-developed, well-nourished, pleasant and cooperative in NAD Lungs:  Respirations even and unlabored.  Clear throughout to auscultation.   No wheezes, crackles, or rhonchi. No acute distress. Heart:  Regular rate and rhythm; no murmurs, clicks, rubs, or gallops. Abdomen:  Normal bowel sounds.  No bruits.  Soft, and non-distended without masses, hepatosplenomegaly or hernias noted.  No Tenderness.  No guarding or rebound tenderness.    Neurologic:  Alert and oriented x3;  grossly normal neurologically. Psych:  Alert and cooperative. Normal mood and affect.  Imaging Studies: No results found.  Assessment and Plan:   Leydy C Mcgirr is a 78 y.o. y/o female has been referred for:   1.  Mild Acid Reflux; controlled on low-dose Pepcid and Tums as needed.  No alarm symptoms.  Continue OTC Pepcid and TUMS as needed.  Repeat EGD is not indicated at this time.  Continue Lifestyle Modifications to prevent Acid Reflux.  Rec. Avoid coffee, sodas,  peppermint, garlic, onions, alcohol, citrus fruits, chocolate, tomatoes, fatty and spicey foods.  Avoid eating 2-3 hours before bedtime.    2.  History of colon polyps  Scheduling Colonoscopy I discussed risks of colonoscopy with patient to include risk of bleeding, colon perforation, and risk of sedation.  Patient expressed understanding and agrees to proceed with colonoscopy.   Follow up as needed if recurrent or worsening GI symptoms.  Brigitte Canard, PA-C

## 2023-08-22 ENCOUNTER — Encounter: Payer: Self-pay | Admitting: Physician Assistant

## 2023-08-22 ENCOUNTER — Ambulatory Visit (INDEPENDENT_AMBULATORY_CARE_PROVIDER_SITE_OTHER): Admitting: Physician Assistant

## 2023-08-22 VITALS — BP 110/72 | HR 69 | Ht 63.25 in | Wt 165.8 lb

## 2023-08-22 DIAGNOSIS — Z860100 Personal history of colon polyps, unspecified: Secondary | ICD-10-CM | POA: Diagnosis not present

## 2023-08-22 DIAGNOSIS — K219 Gastro-esophageal reflux disease without esophagitis: Secondary | ICD-10-CM

## 2023-08-22 MED ORDER — NA SULFATE-K SULFATE-MG SULF 17.5-3.13-1.6 GM/177ML PO SOLN
1.0000 | Freq: Once | ORAL | 0 refills | Status: AC
Start: 2023-08-22 — End: 2023-08-22

## 2023-08-22 NOTE — Progress Notes (Signed)
 Agree with assessment and plan as outlined.

## 2023-08-22 NOTE — Patient Instructions (Signed)
 You have been scheduled for a colonoscopy. Please follow written instructions given to you at your visit today.   If you use inhalers (even only as needed), please bring them with you on the day of your procedure.  DO NOT TAKE 7 DAYS PRIOR TO TEST- Trulicity (dulaglutide) Ozempic, Wegovy (semaglutide) Mounjaro (tirzepatide) Bydureon Bcise (exanatide extended release)  DO NOT TAKE 1 DAY PRIOR TO YOUR TEST Rybelsus (semaglutide) Adlyxin (lixisenatide) Victoza (liraglutide) Byetta (exanatide) ______________________________________________________________________   If your blood pressure at your visit was 140/90 or greater, please contact your primary care physician to follow up on this.  _______________________________________________________  If you are age 73 or older, your body mass index should be between 23-30. Your Body mass index is 29.14 kg/m. If this is out of the aforementioned range listed, please consider follow up with your Primary Care Provider.  If you are age 49 or younger, your body mass index should be between 19-25. Your Body mass index is 29.14 kg/m. If this is out of the aformentioned range listed, please consider follow up with your Primary Care Provider.   ________________________________________________________  The [facility] providers would like to encourage you to use MYCHART to communicate with providers for non-urgent requests or questions.  Due to long hold times on the telephone, sending your provider a message by [facility] may be a faster and more efficient way to get a response.  Please allow 48 business hours for a response.  Please remember that this is for non-urgent requests.  _______________________________________________________

## 2023-09-15 ENCOUNTER — Emergency Department (HOSPITAL_BASED_OUTPATIENT_CLINIC_OR_DEPARTMENT_OTHER)
Admission: EM | Admit: 2023-09-15 | Discharge: 2023-09-15 | Disposition: A | Discharge status: Home / Self Care | Attending: Emergency Medicine | Admitting: Emergency Medicine

## 2023-09-15 ENCOUNTER — Encounter (HOSPITAL_BASED_OUTPATIENT_CLINIC_OR_DEPARTMENT_OTHER): Payer: Self-pay

## 2023-09-15 ENCOUNTER — Other Ambulatory Visit: Payer: Self-pay

## 2023-09-15 DIAGNOSIS — S0006XA Insect bite (nonvenomous) of scalp, initial encounter: Secondary | ICD-10-CM | POA: Insufficient documentation

## 2023-09-15 DIAGNOSIS — W57XXXA Bitten or stung by nonvenomous insect and other nonvenomous arthropods, initial encounter: Secondary | ICD-10-CM | POA: Diagnosis not present

## 2023-09-15 MED ORDER — CETIRIZINE HCL 10 MG PO TABS
10.0000 mg | ORAL_TABLET | Freq: Every day | ORAL | 1 refills | Status: DC
Start: 2023-09-15 — End: 2023-12-31

## 2023-09-15 NOTE — ED Provider Notes (Addendum)
 [facility] Provider Note   CSN: [id number] Arrival date & time: 09/15/23  1157     History Chief Complaint  Patient presents with   Pruritis    HPI Lindsey Barrett is a 78 y.o. female presenting for a nodule on the occipital part of her scalp that is itchy and inflamed and has been present for approximately 2 weeks.  Is concern for an insect bite however states that the reason she comes in today was because after getting out of the shower the lesion was more red and caused her concerned that this might be infected.  She has been trying topical-hydrocortisone as well as using calamine lotion on the area with some initial relief, however she states that it has been more itchy over the last several days and has but she has been scratching at it more frequently.     Patient's recorded medical, surgical, social, medication list and allergies were reviewed in the Snapshot window as part of the initial history.   Review of Systems   Review of Systems  Skin:  Positive for rash.       Inflamed nodule on the posterior scalp  All other systems reviewed and are negative.   Physical Exam Updated Vital Signs BP 135/74 (BP Location: Right Arm)   Pulse 71   Temp 98 F (36.7 C) (Oral)   Resp 18   Ht 5\' 4"  (1.626 m)   Wt 74.8 kg   SpO2 99%   BMI 28.32 kg/m  Physical Exam Vitals and nursing note reviewed.  HENT:     Head: Normocephalic and atraumatic.  Eyes:     General: No scleral icterus.    Conjunctiva/sclera: Conjunctivae normal.  Pulmonary:     Effort: Pulmonary effort is normal.  Abdominal:     General: Abdomen is flat.  Musculoskeletal:     Cervical back: Normal range of motion and neck supple.  Skin:    Findings: Lesion present. No rash.     Comments: Approximately 1 cm erythematous nodule to the occipital scalp.  Overlying skin is excoriated and dry.  Neurological:     Mental Status: She is alert.  Psychiatric:         Mood and Affect: Mood normal.      ED Course/ Medical Decision Making/ A&P    Procedures Procedures   Medications Ordered in ED Medications - No data to display  Medical Decision Making:   SHAKESHIA CHERRY is a 78 y.o. female who presented to the ED today with parents for insect bite detailed above.     Complete initial physical exam performed, notably the patient  was alert and oriented and in no apparent distress.  She has an erythematous and indurated nodule of approximately 1 cm in diameter on the occipital scalp.  It appears excoriated and is mildly erythematous.  There is no exudate, and there is no surrounding lymph adenopathy.     Reviewed and confirmed nursing documentation for past medical history, family history, social history.    Initial Assessment:   With the patient's presentation of itchy nodule to the scalp, most likely diagnosis is insect bite. Other diagnoses were considered including (but not limited to) basal or squamous cell carcinoma, other superficial skin infection. These are considered less likely due to history of present illness and physical exam findings.     Initial Plan:  Plan to manage with oral antihistamines along with continued use of topical corticosteroid. Objective  evaluation as below reviewed   Reassessment and Plan:   Nodule appears localized along with excoriation and erythema suspect that this is continued irritation of her previous bug bite.  Plan to manage with oral cetirizine along with continued use of topical hydrocortisone cream to manage itching.  Advised patient to follow-up with primary care as needed    Clinical Impression:  1. Insect bite of scalp, initial encounter      Discharge   Final Clinical Impression(s) / ED Diagnoses Final diagnoses:  Insect bite of scalp, initial encounter    Rx / DC Orders ED Discharge Orders          Ordered    cetirizine (ZYRTEC) 10 MG tablet  Daily        09/15/23 1222               Juanetta Nordmann, [place] 09/15/23 1225    Juanetta Nordmann, [place] 09/15/23 1227    Lindsey Arias, MD 09/15/23 1431

## 2023-09-15 NOTE — ED Triage Notes (Signed)
 Pt reports that she had an insect bite a few weeks ago to her neck. States that she noted some redness and swelling that was knot-like and was concerned for infection.

## 2023-09-23 ENCOUNTER — Other Ambulatory Visit: Payer: Self-pay

## 2023-09-23 MED ORDER — EZETIMIBE 10 MG PO TABS: 10.0000 mg | ORAL_TABLET | Freq: Every day | ORAL | 2 refills | Status: AC

## 2023-10-01 ENCOUNTER — Encounter: Payer: Self-pay | Admitting: Gastroenterology

## 2023-10-16 ENCOUNTER — Ambulatory Visit: Payer: Self-pay | Admitting: *Deleted

## 2023-10-16 DIAGNOSIS — S1096XA Insect bite of unspecified part of neck, initial encounter: Secondary | ICD-10-CM | POA: Insufficient documentation

## 2023-10-16 NOTE — Progress Notes (Signed)
 Subjective:    Patient ID: Lindsey Barrett, female    DOB: 03/06/1946, 78 y.o.   MRN: [id number]      HPI Mckaylin is here for  Chief Complaint  Patient presents with   Insect Bite    Insect back on the back on her neck; About 2 weeks after she was bitten, it had grown in size     Insect bite back of neck at hairline 5-6 weeks.  It started itching. She assumed it was a bug bite.  She tried calamine lotion and that helped.  Someone told her they looked like two mosquito bites.   She did go to the ED 5/11 since it had been present for 2 weeks-she had an erythematous and indurated nodule approximately 1 cm in diameter on the occipital scalp per emergency room notes.  It appeared excoriated and mildly erythematous.  There is no exudate.  There is no surrounding lymphadenopathy.  She was advised to take oral cetirizine  ( she did not take it) and topical hydrocortisone.  She has tried over-the-counter hydrocortisone cream.  It has varied in intensity over the past several weeks- it gets better and then erupts with intense itching for a few days.  If feels like something is in there.  When she scratches it - it gets larger.    Medications and allergies reviewed with patient and updated if appropriate.  Current Outpatient Medications on File Prior to Visit  Medication Sig Dispense Refill   aspirin  EC 81 MG tablet Take 1 tablet (81 mg total) by mouth daily.     bimatoprost (LUMIGAN) 0.01 % SOLN Place 1 drop into both eyes at bedtime.     calcium  carbonate (TUMS - DOSED IN MG ELEMENTAL CALCIUM ) 500 MG chewable tablet Chew 1 tablet by mouth as needed for indigestion or heartburn (1000 mg).      Carboxymethylcellulose Sodium (REFRESH LIQUIGEL OP) Apply 1 drop to eye 4 (four) times daily as needed.     cetirizine  (ZYRTEC ) 10 MG tablet Take 1 tablet (10 mg total) by mouth daily. 30 tablet 1   Cholecalciferol (VITAMIN D3 PO) Take 5,000 Int'l Units by mouth daily.      [employer]S SUNSCREEN SPF 30  EX apply topically to face and body daily for 30     dorzolamide (TRUSOPT) 2 % ophthalmic solution Place 1 drop into both eyes 2 (two) times daily.     ezetimibe  (ZETIA ) 10 MG tablet Take 1 tablet (10 mg total) by mouth daily. 90 tablet 2   famotidine  (PEPCID ) 10 MG tablet Take 10 mg by mouth as needed.     ibuprofen  (ADVIL ) 200 MG tablet Take 200 mg by mouth every 6 (six) hours as needed.     rosuvastatin  (CRESTOR ) 40 MG tablet Take 1 tablet by mouth once daily 90 tablet 3   sodium chloride  (MURO 128) 5 % ophthalmic solution Place 1 drop into both eyes 2 (two) times a day. Before Azopt     Vaginal Lubricant (REPLENS) GEL Place 2 application  vaginally. As needed     [DISCONTINUED] metoprolol  tartrate (LOPRESSOR ) 25 MG tablet Take 1 tablet (25 mg total) by mouth as directed. 1 pill every 8 hours as needed for tachycardia or palpitations. 30 tablet 2   No current facility-administered medications on file prior to visit.    Review of Systems  Constitutional:  Negative for chills and fever.  Musculoskeletal:  Negative for myalgias.  Neurological:  Negative for headaches.  Objective:   Vitals:   10/17/23 0941  BP: 110/60  Pulse: 74  Temp: 97.8 F (36.6 C)  SpO2: 95%   BP Readings from Last 3 Encounters:  10/17/23 110/60  09/15/23 135/74  08/22/23 110/72   Wt Readings from Last 3 Encounters:  10/17/23 166 lb (75.3 kg)  09/15/23 165 lb (74.8 kg)  08/22/23 165 lb 12.8 oz (75.2 kg)   Body mass index is 28.49 kg/m.    Physical Exam Constitutional:      General: She is not in acute distress.    Appearance: Normal appearance. She is not ill-appearing.  HENT:     Head: Normocephalic and atraumatic.  Neck:     Comments: No lymphadenopathy Skin:    General: Skin is warm and dry.     Findings: Lesion (Raised papule just over half of a centimeter in diameter-nontender, firm.  No fluctuance.  No open wound, discharge even with pressure, slightly mobile) present.    Neurological:     Mental Status: She is alert.       Erythema related to me palpating the lesion     Assessment & Plan:    See Problem List for Assessment and Plan of chronic medical problems.

## 2023-10-16 NOTE — Patient Instructions (Addendum)
     Follow up with dermatology as schedule - sooner only if the cyst bothers you.

## 2023-10-16 NOTE — Telephone Encounter (Addendum)
 Copied from CRM [id number]. Topic: Clinical - Red Word Triage >> Oct 16, 2023  9:17 AM Alyse July wrote: Red Word that prompted transfer to Nurse Triage: Bug Bite Reason for Disposition  [1] SEVERE local itching (i.e., interferes with work, school, sleep) AND [2] not improved after 24 hours of hydrocortisone cream  Answer Assessment - Initial Assessment Questions 1. TYPE of INSECT: What type of insect was it?     Not sure  2. ONSET: When did you get bitten?      Greater than 3 weeks ago  3. LOCATION: Where is the insect bite located?      Back of neck at hair line  4. REDNESS: Is the area red or pink? If Yes, ask: What size is area of redness? (inches or cm). When did the redness start?     Pink size of pea 3 weeks ago now 1/2 size of pencil eraser  5. PAIN: Is there any pain? If Yes, ask: How bad is it?  (Scale 1-10; or mild, moderate, severe)     Na  6. ITCHING: Does it itch? If Yes, ask: How bad is the itch?    - MILD: doesn't interfere with normal activities   - MODERATE-SEVERE: interferes with work, school, sleep, or other activities      Yes  7. SWELLING: How big is the swelling? (inches, cm, or compare to coins)     Small amount 8. OTHER SYMPTOMS: Do you have any other symptoms?  (e.g., difficulty breathing, hives)     Back of neck itching  9. PREGNANCY: Is there any chance you are pregnant? When was your last menstrual period?     na  Protocols used: Insect Bite-A-AH

## 2023-10-16 NOTE — Telephone Encounter (Signed)
 FYI Only or Action Required?: Action required by provider  Patient was last seen in primary care on 06/14/2023 by Colene Dauphin, MD. Called Nurse Triage reporting Insect Bite. Symptoms began several weeks ago. Interventions attempted: OTC medications: hydrocortisone cream . Symptoms are: unchanged.  Triage Disposition: See PCP When Office is Open (Within 3 Days)  Patient/caregiver understands and will follow disposition?: Yes

## 2023-10-17 ENCOUNTER — Ambulatory Visit: Admitting: Internal Medicine

## 2023-10-17 ENCOUNTER — Encounter: Payer: Self-pay | Admitting: Internal Medicine

## 2023-10-17 VITALS — BP 110/60 | HR 74 | Temp 97.8°F | Ht 64.0 in | Wt 166.0 lb

## 2023-10-17 DIAGNOSIS — W57XXXA Bitten or stung by nonvenomous insect and other nonvenomous arthropods, initial encounter: Secondary | ICD-10-CM

## 2023-10-17 DIAGNOSIS — L720 Epidermal cyst: Secondary | ICD-10-CM | POA: Diagnosis not present

## 2023-10-17 NOTE — Assessment & Plan Note (Signed)
 Acute Lesion on posterior neck looks like an epidermal cyst Discussed that this is benign Likely insect bite-she may have had an insect bite but that was likely the lesion that has resolved Avoid itching Can have dermatology remove it if it is very bothersome

## 2023-10-21 ENCOUNTER — Telehealth: Payer: Self-pay | Admitting: Obstetrics and Gynecology

## 2023-10-21 DIAGNOSIS — E2839 Other primary ovarian failure: Secondary | ICD-10-CM

## 2023-10-21 NOTE — Telephone Encounter (Signed)
 Bone scan reminder

## 2023-10-22 ENCOUNTER — Encounter: Payer: Self-pay | Admitting: Internal Medicine

## 2023-10-24 ENCOUNTER — Encounter: Payer: Self-pay | Admitting: Gastroenterology

## 2023-10-24 ENCOUNTER — Ambulatory Visit: Admitting: Gastroenterology

## 2023-10-24 VITALS — BP 181/84 | HR 65 | Temp 97.5°F | Resp 13 | Ht 63.25 in | Wt 165.0 lb

## 2023-10-24 DIAGNOSIS — Z860100 Personal history of colon polyps, unspecified: Secondary | ICD-10-CM

## 2023-10-24 DIAGNOSIS — Z1211 Encounter for screening for malignant neoplasm of colon: Secondary | ICD-10-CM | POA: Diagnosis not present

## 2023-10-24 DIAGNOSIS — F419 Anxiety disorder, unspecified: Secondary | ICD-10-CM | POA: Diagnosis not present

## 2023-10-24 DIAGNOSIS — K5730 Diverticulosis of large intestine without perforation or abscess without bleeding: Secondary | ICD-10-CM | POA: Diagnosis not present

## 2023-10-24 DIAGNOSIS — D123 Benign neoplasm of transverse colon: Secondary | ICD-10-CM | POA: Diagnosis not present

## 2023-10-24 DIAGNOSIS — Z860101 Personal history of adenomatous and serrated colon polyps: Secondary | ICD-10-CM | POA: Diagnosis not present

## 2023-10-24 DIAGNOSIS — E785 Hyperlipidemia, unspecified: Secondary | ICD-10-CM | POA: Diagnosis not present

## 2023-10-24 MED ORDER — SODIUM CHLORIDE 0.9 % IV SOLN: 500.0000 mL | INTRAVENOUS | Status: AC

## 2023-10-24 NOTE — Op Note (Signed)
 [facility] Patient Name: Lindsey Barrett Procedure Date: 10/24/2023 11:38 AM MRN: [id number] Endoscopist: Landon Pinion P. General Kenner , MD, [id number] Age: 78 Referring MD:  Date of Birth: 1945-07-13 Gender: Female Account #: [id number] Procedure:                Colonoscopy Indications:              High risk colon cancer surveillance: Personal                            history of colonic polyps - adenoma removed 02/2016 Medicines:                Monitored Anesthesia Care Procedure:                Pre-Anesthesia Assessment:                           - Prior to the procedure, a History and Physical                            was performed, and patient medications and                            allergies were reviewed. The patient's tolerance of                            previous anesthesia was also reviewed. The risks                            and benefits of the procedure and the sedation                            options and risks were discussed with the patient.                            All questions were answered, and informed consent                            was obtained. Prior Anticoagulants: The patient has                            taken no anticoagulant or antiplatelet agents. ASA                            Grade Assessment: II - A patient with mild systemic                            disease. After reviewing the risks and benefits,                            the patient was deemed in satisfactory condition to                            undergo the procedure.  After obtaining informed consent, the colonoscope                            was passed under direct vision. Throughout the                            procedure, the patient's blood pressure, pulse, and                            oxygen saturations were monitored continuously. The                            Olympus Scope SN: [id number] was introduced through                             the anus and advanced to the the cecum, identified                            by appendiceal orifice and ileocecal valve. The                            colonoscopy was performed without difficulty. The                            patient tolerated the procedure well. The quality                            of the bowel preparation was adequate. The                            ileocecal valve, appendiceal orifice, and rectum                            were photographed. Scope In: 11:56:01 AM Scope Out: 12:13:47 PM Scope Withdrawal Time: 0 hours 11 minutes 1 second  Total Procedure Duration: 0 hours 17 minutes 46 seconds  Findings:                 The perianal and digital rectal examinations were                            normal.                           Many diverticula were found in the entire colon.                           A 3 mm polyp was found in the transverse colon. The                            polyp was sessile. The polyp was removed with a                            cold snare. Resection and retrieval were complete.  The colon was tortous. The exam was otherwise                            without abnormality. Complications:            No immediate complications. Estimated blood loss:                            Minimal. Estimated Blood Loss:     Estimated blood loss was minimal. Impression:               - Diverticulosis in the entire examined colon.                           - One 3 mm polyp in the transverse colon, removed                            with a cold snare. Resected and retrieved.                           - Tortous colon.                           - The examination was otherwise normal. Recommendation:           - Patient has a contact number available for                            emergencies. The signs and symptoms of potential                            delayed complications were discussed with the                            patient.  Return to normal activities tomorrow.                            Written discharge instructions were provided to the                            patient.                           - Resume previous diet.                           - Continue present medications.                           - Await pathology results. No further surveillance                            exams recommended given no high risk lesions on                            this exam and the patient's age. Landon Pinion P. Ether Goebel, MD 10/24/2023 12:19:49 PM This report  has been signed electronically.

## 2023-10-24 NOTE — Progress Notes (Signed)
 Report to PACU, RN, vss, BBS= Clear.

## 2023-10-24 NOTE — Patient Instructions (Signed)
 Handouts Provided:  Polyps and Diverticulosis  YOU HAD AN ENDOSCOPIC PROCEDURE TODAY AT THE [facility]:   Refer to the procedure report that was given to you for any specific questions about what was found during the examination.  If the procedure report does not answer your questions, please call your gastroenterologist to clarify.  If you requested that your care partner not be given the details of your procedure findings, then the procedure report has been included in a sealed envelope for you to review at your convenience later.  YOU SHOULD EXPECT: Some feelings of bloating in the abdomen. Passage of more gas than usual.  Walking can help get rid of the air that was put into your GI tract during the procedure and reduce the bloating. If you had a lower endoscopy (such as a colonoscopy or flexible sigmoidoscopy) you may notice spotting of blood in your stool or on the toilet paper. If you underwent a bowel prep for your procedure, you may not have a normal bowel movement for a few days.  Please Note:  You might notice some irritation and congestion in your nose or some drainage.  This is from the oxygen used during your procedure.  There is no need for concern and it should clear up in a day or so.  SYMPTOMS TO REPORT IMMEDIATELY:  Following lower endoscopy (colonoscopy or flexible sigmoidoscopy):  Excessive amounts of blood in the stool  Significant tenderness or worsening of abdominal pains  Swelling of the abdomen that is new, acute  Fever of 100F or higher  For urgent or emergent issues, a gastroenterologist can be reached at any hour by calling [phone]. Do not use MyChart messaging for urgent concerns.    DIET:  We do recommend a small meal at first, but then you may proceed to your regular diet.  Drink plenty of fluids but you should avoid alcoholic beverages for 24 hours.  ACTIVITY:  You should plan to take it easy for the rest of today and you should NOT DRIVE  or use heavy machinery until tomorrow (because of the sedation medicines used during the test).    FOLLOW UP: Our staff will call the number listed on your records the next business day following your procedure.  We will call around 7:15- 8:00 am to check on you and address any questions or concerns that you may have regarding the information given to you following your procedure. If we do not reach you, we will leave a message.     If any biopsies were taken you will be contacted by phone or by letter within the next 1-3 weeks.  Please call us at [phone] if you have not heard about the biopsies in 3 weeks.    SIGNATURES/CONFIDENTIALITY: You and/or your care partner have signed paperwork which will be entered into your electronic medical record.  These signatures attest to the fact that that the information above on your After Visit Summary has been reviewed and is understood.  Full responsibility of the confidentiality of this discharge information lies with you and/or your care-partner.

## 2023-10-24 NOTE — Progress Notes (Signed)
 [facility] History and Physical   Primary Care Physician:  Colene Dauphin, MD   Reason for Procedure:   History of colon polyps  Plan:    colonoscopy     HPI: Lindsey Barrett is a 78 y.o. female  here for colonoscopy surveillance - adenoma removed 02/2016.  Aaron Aas Patient denies any bowel symptoms at this time. No family history of colon cancer known. Otherwise feels well without any cardiopulmonary symptoms.   I have discussed risks / benefits of anesthesia and endoscopic procedure with Zeina C Starr and they wish to proceed with the exams as outlined today.    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   Breast cyst    x3   DDD (degenerative disc disease)    Diverticulosis    Fasting hyperglycemia    GERD (gastroesophageal reflux disease)    Oletta Berry syndrome    Glaucoma    History of hiatal hernia    HSV infection    Hyperlipidemia    Radiation    left breast 50.4 gray   STD (sexually transmitted disease)    HSV    Past Surgical History:  Procedure Laterality Date   APPENDECTOMY  1961   BREAST CYST ASPIRATION     x2 right ,x1 left   COLONOSCOPY  2007   neg   DILATION AND CURETTAGE OF UTERUS  1975   FLEXIBLE SIGMOIDOSCOPY     x2   PELVIC LAPAROSCOPY     RADIOACTIVE SEED GUIDED PARTIAL MASTECTOMY WITH AXILLARY SENTINEL LYMPH NODE BIOPSY Left 05/31/2015   Procedure: LEFT BREAST RADIOACTIVE SEED GUIDED LUMPECTOMY WITH LEFT AXILLARY SENTINEL LYMPH NODE BIOPSY;  Surgeon: Enid Harry, MD;  Location: [facility];  Service: General;  Laterality: Left;   RE-EXCISION OF BREAST LUMPECTOMY Left 07/01/2015   Procedure: RE-EXCISION OF LEFT BREAST INFERIOR MARGIN;  Surgeon: Enid Harry, MD;  Location: [facility];  Service: General;  Laterality: Left;   UPPER GASTROINTESTINAL ENDOSCOPY      UPPER GI ENDOSCOPY  10/2010   Dr Jacalyn Martin TOOTH EXTRACTION  1978    Prior to Admission medications   Medication Sig Start Date End Date Taking? Authorizing Provider  aspirin  EC 81 MG tablet Take 1 tablet (81 mg total) by mouth daily. 09/15/19  Yes Gudena, Vinay, MD  bimatoprost (LUMIGAN) 0.01 % SOLN Place 1 drop into both eyes at bedtime.   Yes [provider]  Carboxymethylcellulose Sodium (REFRESH LIQUIGEL OP) Apply 1 drop to eye 4 (four) times daily as needed.   Yes [provider]  Cholecalciferol (VITAMIN D3 PO) Take 5,000 Int'l Units by mouth daily.    Yes [provider]  dorzolamide (TRUSOPT) 2 % ophthalmic solution Place 1 drop into both eyes 2 (two) times daily. 06/03/23  Yes [provider]  ezetimibe  (ZETIA ) 10 MG tablet Take 1 tablet (10 mg total) by mouth daily. 09/23/23  Yes Duke, Warren Haber, PA  famotidine  (PEPCID ) 10 MG tablet Take 10 mg by mouth as needed.   Yes [provider]  rosuvastatin  (CRESTOR ) 40 MG tablet Take 1 tablet by mouth once daily 07/16/23  Yes Burns, Beckey Bourgeois, MD  sodium chloride  (MURO 128) 5 % ophthalmic solution Place 1 drop into both eyes 2 (two) times a day. Before Azopt   Yes [provider]  Vaginal Lubricant (REPLENS) GEL Place 2 application  vaginally. As needed   Yes [provider]  calcium  carbonate (TUMS - DOSED IN MG ELEMENTAL CALCIUM ) 500 MG chewable tablet Chew 1 tablet by mouth as needed for indigestion or heartburn (1000 mg).     [provider]  cetirizine  (ZYRTEC ) 10 MG tablet Take 1 tablet (10 mg total) by mouth daily. 09/15/23   Juanetta Nordmann, PA  [employer] SUNSCREEN SPF 30 EX apply topically to face and body daily for 30 Patient not taking: Reported on 10/24/2023    [provider]  ibuprofen  (ADVIL ) 200 MG tablet Take 200 mg by mouth every 6 (six) hours as needed.    [provider]  metoprolol  tartrate (LOPRESSOR ) 25 MG tablet Take 1 tablet (25  mg total) by mouth as directed. 1 pill every 8 hours as needed for tachycardia or palpitations. 01/02/11 10/29/19  Alinda Apley, MD    Current Outpatient Medications  Medication Sig Dispense Refill   aspirin  EC 81 MG tablet Take 1 tablet (81 mg total) by mouth daily.     bimatoprost (LUMIGAN) 0.01 % SOLN Place 1 drop into both eyes at bedtime.     Carboxymethylcellulose Sodium (REFRESH LIQUIGEL OP) Apply 1 drop to eye 4 (four) times daily as needed.     Cholecalciferol (VITAMIN D3 PO) Take 5,000 Int'l Units by mouth daily.      dorzolamide (TRUSOPT) 2 % ophthalmic solution Place 1 drop into both eyes 2 (two) times daily.     ezetimibe  (ZETIA ) 10 MG tablet Take 1 tablet (10 mg total) by mouth daily. 90 tablet 2   famotidine  (PEPCID ) 10 MG tablet Take 10 mg by mouth as needed.     rosuvastatin  (CRESTOR ) 40 MG tablet Take 1 tablet by mouth once daily 90 tablet 3   sodium chloride  (MURO 128) 5 % ophthalmic solution Place 1 drop into both eyes 2 (two) times a day. Before Azopt     Vaginal Lubricant (REPLENS) GEL Place 2 application  vaginally. As needed     calcium  carbonate (TUMS - DOSED IN MG ELEMENTAL CALCIUM ) 500 MG chewable tablet Chew 1 tablet by mouth as needed for indigestion or heartburn (1000 mg).      cetirizine  (ZYRTEC ) 10 MG tablet Take 1 tablet (10 mg total) by mouth daily. 30 tablet 1   [employer] SUNSCREEN SPF 30 EX apply topically to face and body daily for 30 (Patient not taking: Reported on 10/24/2023)     ibuprofen  (ADVIL ) 200 MG tablet Take 200 mg by mouth every 6 (six) hours as needed.     Current Facility-Administered Medications  Medication Dose Route Frequency Provider Last Rate Last Admin   0.9 %  sodium chloride  infusion  500 mL Intravenous Continuous Terence Googe, Lendon Queen, MD        Allergies as of 10/24/2023 - Review Complete 10/24/2023  Allergen Reaction Noted   Arimidex  [anastrozole ] Other (See Comments) 12/01/2015   Latex Rash and Other (See Comments) 02/28/2021    Septra  ds [sulfamethoxazole -trimethoprim ] Hives 10/21/2014   Sulfa  antibiotics Hives 02/28/2017   Sulfur  Other (See Comments) 03/27/2022   Adhesive [tape] Rash 05/31/2015   Augmentin  [amoxicillin -pot clavulanate] Diarrhea 10/10/2020   Epinephrine  Other (See Comments) 03/27/2022    Family History  Problem Relation Age of Onset   Lung cancer Mother        smoker   Endometriosis Mother    Cancer Mother        Bladder cancer  Stomach cancer Maternal Grandmother    Coronary artery disease Father    Cancer Father 13       intestinal carcinoid tumors of the ileum   Sudden death Sister        70 week old, congenital deformity   Liver cancer Maternal Uncle    Stroke Paternal Grandfather 42   Leukemia Maternal Uncle    Breast cancer Paternal Aunt        great paternal aunt-Age 19's   Colon cancer Paternal Aunt        lived to 86; pat great aunt   Vascular Disease Paternal Grandmother        thoracic aneurysm   Esophageal cancer Neg Hx    Rectal cancer Neg Hx    Pancreatic cancer Neg Hx     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  Tobacco Use   Smoking status: Former    Current packs/day: 0.00    Types: Cigarettes    Quit date: 03/28/1999    Years since quitting: 24.5   Smokeless tobacco: Never   Tobacco comments:    smoked 1967-2000, up to 3/4 ppd  Vaping Use   Vaping status: Never Used  Substance and Sexual Activity   Alcohol use: Yes    Alcohol/week: 10.0 standard drinks of alcohol    Types: 10 Cans of beer per week   Drug use: No   Sexual activity: Not Currently    Partners: Male    Birth control/protection: Post-menopausal    Comment: 1st intercourse 78 yo-More than 5 partners  Other Topics Concern    Not on file  Social History Narrative   Occupation:  Loss adjuster, 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      Regular exercise-no   Social Drivers of Health   Financial Resource Strain: Low Risk  (06/12/2023)   Overall Financial Resource Strain (CARDIA)    Difficulty of Paying Living Expenses: Not hard at all  Food Insecurity: No Food Insecurity (06/12/2023)   Hunger Vital Sign    Worried About Running Out of Food in the Last Year: Never true    Ran Out of Food in the Last Year: Never true  Transportation Needs: No Transportation Needs (06/12/2023)   PRAPARE - Administrator, Civil Service (Medical): No    Lack of Transportation (Non-Medical): No  Physical Activity: Insufficiently Active (06/12/2023)   Exercise Vital Sign    Days of Exercise per Week: 3 days    Minutes of Exercise per Session: 20 min  Stress: Stress Concern Present (06/12/2023)   [employer] of Occupational Health - Occupational Stress Questionnaire    Feeling of Stress : To some extent  Social Connections: Moderately Integrated (06/12/2023)   Social Connection and Isolation Panel    Frequency of Communication with Friends and Family: More than three times a week    Frequency of Social Gatherings with Friends and Family: More than three times a week    Attends Religious Services: More than 4 times per year    Active Member of [employer] or Organizations: Yes    Attends Banker Meetings: More than 4 times per year  Marital Status: Divorced  Catering manager Violence: Not At Risk (04/24/2021)   Humiliation, Afraid, Rape, and Kick questionnaire    Fear of Current or Ex-Partner: No    Emotionally Abused: No    Physically Abused: No    Sexually Abused: No    Review of Systems: All other review of systems negative except as mentioned in the HPI.  Physical Exam: Vital signs BP (!) 172/80   Pulse 76   Temp (!) 97.5 F (36.4 C)   Resp 16   Ht 5' 3.25 (1.607 m)   Wt 165 lb (74.8 kg)   SpO2  96%   BMI 29.00 kg/m   General:   Alert,  Well-developed, pleasant and cooperative in NAD Lungs:  Clear throughout to auscultation.   Heart:  Regular rate and rhythm Abdomen:  Soft, nontender and nondistended.   Neuro/Psych:  Alert and cooperative. Normal mood and affect. A and O x 3  Christi Coward, MD [facility]

## 2023-10-25 ENCOUNTER — Telehealth: Payer: Self-pay

## 2023-10-25 NOTE — Telephone Encounter (Signed)
  Follow up Call-     10/24/2023   10:55 AM  Call back number  Post procedure Call Back phone  # [phone]  Permission to leave phone message Yes     Patient questions:  Do you have a fever, pain , or abdominal swelling? No. Pain Score  0 *  Have you tolerated food without any problems? Yes.    Have you been able to return to your normal activities? Yes.    Do you have any questions about your discharge instructions: Diet   No. Medications  No. Follow up visit  No.  Do you have questions or concerns about your Care? No.  Actions: * If pain score is 4 or above: No action needed, pain <4.

## 2023-10-28 LAB — SURGICAL PATHOLOGY

## 2023-10-30 ENCOUNTER — Ambulatory Visit: Payer: Self-pay | Admitting: Gastroenterology

## 2023-11-12 DIAGNOSIS — H1131 Conjunctival hemorrhage, right eye: Secondary | ICD-10-CM | POA: Diagnosis not present

## 2023-11-19 ENCOUNTER — Ambulatory Visit: Admitting: Family Medicine

## 2023-11-19 VITALS — BP 112/64 | HR 70 | Temp 97.6°F | Ht 63.25 in | Wt 168.0 lb

## 2023-11-19 DIAGNOSIS — H938X2 Other specified disorders of left ear: Secondary | ICD-10-CM

## 2023-11-19 DIAGNOSIS — H6122 Impacted cerumen, left ear: Secondary | ICD-10-CM | POA: Diagnosis not present

## 2023-11-19 NOTE — Progress Notes (Signed)
 Subjective:  Lindsey Barrett is a 78 y.o. female who presents for a several month history of left ear popping and having pressure.  Recently noticed similar symptoms in the right.  She has some mild congestion and postnasal drainage.  Currently not taking anything for this. Denies feeling sick.  No fever, chills, sinus pain or congestion, headache, rhinorrhea, sore throat, cough.  States she has seen ENT in the past.  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  ROS as in subjective.   Objective: Vitals:   11/19/23 1101  BP: 112/64  Pulse: 70  Temp: 97.6 F (36.4 C)  SpO2: 96%    General appearance: Alert, WD/WN, no distress                             Skin: warm, no rash                           Head: no sinus tenderness                            Eyes: conjunctiva normal, corneas clear, PERRLA                            Ears: L canal with cerumen impaction, R TM and canal normal                          Nose: septum midline, no drainage              Mouth/throat: MMM, tongue normal                           Neck: supple, no adenopathy, no thyromegaly, nontender                          Heart: Regular rate                          Lungs: unlabored       Assessment: Impacted cerumen of left ear  Sensation of fullness in ear, left   Plan: Left ear with cerumen impaction.  CMA performed ear lavage with patient consent.  Post lavage, ear canal clear and patient noticed improvement in symptoms.  Discussed trying Zyrtec  daily and follow-up if any new or worsening symptoms.

## 2023-11-19 NOTE — Progress Notes (Signed)
 Left ear unable to visualize tympanic membrane due to cerumen impaction.  Patient consent obtained. Irrigation with water and peroxide performed. Full view of tympanic membranes after procedure.  Patient tolerated procedure well.

## 2023-11-20 DIAGNOSIS — H2513 Age-related nuclear cataract, bilateral: Secondary | ICD-10-CM | POA: Diagnosis not present

## 2023-11-20 DIAGNOSIS — H18593 Other hereditary corneal dystrophies, bilateral: Secondary | ICD-10-CM | POA: Diagnosis not present

## 2023-11-20 DIAGNOSIS — H53453 Other localized visual field defect, bilateral: Secondary | ICD-10-CM | POA: Diagnosis not present

## 2023-11-20 DIAGNOSIS — H40053 Ocular hypertension, bilateral: Secondary | ICD-10-CM | POA: Diagnosis not present

## 2023-12-03 DIAGNOSIS — H2511 Age-related nuclear cataract, right eye: Secondary | ICD-10-CM | POA: Diagnosis not present

## 2023-12-30 ENCOUNTER — Encounter: Payer: Self-pay | Admitting: Cardiology

## 2023-12-30 ENCOUNTER — Ambulatory Visit: Attending: Cardiology | Admitting: Cardiology

## 2023-12-30 VITALS — BP 134/70 | HR 67 | Ht 64.0 in | Wt 170.0 lb

## 2023-12-30 DIAGNOSIS — I10 Essential (primary) hypertension: Secondary | ICD-10-CM | POA: Diagnosis not present

## 2023-12-30 DIAGNOSIS — E785 Hyperlipidemia, unspecified: Secondary | ICD-10-CM | POA: Insufficient documentation

## 2023-12-30 DIAGNOSIS — E1169 Type 2 diabetes mellitus with other specified complication: Secondary | ICD-10-CM | POA: Insufficient documentation

## 2023-12-30 DIAGNOSIS — I700 Atherosclerosis of aorta: Secondary | ICD-10-CM | POA: Insufficient documentation

## 2023-12-30 DIAGNOSIS — I872 Venous insufficiency (chronic) (peripheral): Secondary | ICD-10-CM | POA: Diagnosis not present

## 2023-12-30 DIAGNOSIS — R42 Dizziness and giddiness: Secondary | ICD-10-CM | POA: Insufficient documentation

## 2023-12-30 NOTE — Progress Notes (Unsigned)
 Cardiology Office Note:  .   Date:  12/31/2023  ID:  Lindsey Barrett, DOB 12-21-1945, MRN [id number] PCP: Geofm Glade PARAS, MD  [facility] Providers Cardiologist:  Alm Clay, MD Cardiology APP:  Madie Jon Garre, [place]     Chief Complaint  Patient presents with   Follow-up    Patient Profile: .     Lindsey Barrett is a  78 y.o. female former smoker (remote) with a PMH notable for breast cancer, Guilbert syndrome, HLD, aortic atherosclerosis , GERD of presyncope who presents here for 1-month follow-up.   She was initially referred to  cardiology at the request of Geofm Glade PARAS, MD for evaluation of presyncope and evaluated Dr. Burnard.  Initial evaluation by Dr. Burnard Dr. Burnard included echocardiogram showed LVEF 65-70% with mild LVH, and no significant valvular disease. A heart monitor worn for 13 days showed sinus rhythm with an average heart rate of 74 bpm, nadir 54 bpm, no episodes of atrial fibrillation, pauses, or ectopy. She was last seen by Dr. Burnard 02/28/2021 and was doing well at that time.  She was then seen by Jon Madie, PA in December 2023 with concern for possible dependent edema after going on a cruise.  DVT study was negative.     Lindsey Barrett was last seen on 06/28/2023 by Jon Madie, PA: She states she has been stressed - had to put her cat down yesterday after 90 days in cat hospice. She had a tooth pulled end of January.   A1c improved to 6.3%, she is disappointed that it isn't lower. She has lost 14 lbs intentionally, but also stress. Thankfully no recent syncope/pre-syncope because she doesn't eat rich foods out.   Subjective  Discussed the use of AI scribe software for clinical note transcription with the patient, who gave verbal consent to proceed.  History of Present Illness Lindsey Barrett is a 78 year old female with who presents for follow-up regarding her cardiovascular health.  She experiences episodes of presyncope,  particularly after eating and while sitting in a recliner. These episodes are characterized by a sensation of impending fainting, belching, and a wave-like sensation that induces anxiety. The symptoms are alleviated by moving around. She has not experienced actual syncope.  Her A1c levels have decreased slightly from 6.4 to 6.3. Despite efforts to lose weight, she has gained six pounds over the last six months, with her current weight at 170 pounds, consistent with measurements at home and the doctor's office.  She has a history of an echocardiogram in July 2021 with an ejection fraction of 65-70%. A previous cardiac monitor did not yield significant results. An EKG from 2012 showed an abnormal T wave, which was deemed normal for her by a previous doctor.  She underwent a colonoscopy in June, which was normal except for one small polyp. A PET scan in 2020 for a lung nodule showed no active cancer but noted brain shrinkage and aortic plaque consistent with her age. She is on 40 mg of Crestor  and 10 mg of Zetia  for cholesterol management.  Her family history includes a grandmother with an aortic aneurysm and a father who had bypass surgery due to arterial sclerosis. 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     Objective   Pertinent Meds: Zetia  10 mg daily, Crestor  40 mg daily.  Aspirin  81 mg daily.  Studies Reviewed: .        Results LABS (06/2023): A1c: 6.3; TC 154, HDL 51, LDL 86, TG 86.  Hgb 13.8; Cr 0.67, K+ 4.5, TSH 2.17, PLT 2.29.  RADIOLOGY PET scan: No active cancer, age-related brain atrophy, aortic plaque (2020) CT scan: Nodule in lung, aortic calcification (2020)  DIAGNOSTIC Echocardiogram: EF 65-70%, mild left ventricular hypertrophy (LVH), normal valves (11/25/2023) Colonoscopy: One tiny polyp (10/2023) EKG: Nonspecific T  wave abnormality, inferior lead T wave inversion   Risk Assessment/Calculations:            Physical Exam:   VS:  BP 134/70 (BP Location: Left Arm, Patient Position: Sitting, Cuff Size: Normal)   Pulse 67   Ht 5' 4 (1.626 m)   Wt 170 lb (77.1 kg)   SpO2 96%   BMI 29.18 kg/m    Wt Readings from Last 3 Encounters:  12/30/23 170 lb (77.1 kg)  11/19/23 168 lb (76.2 kg)  10/24/23 165 lb (74.8 kg)      GEN: Well nourished, well developed in no acute distress; borderline obese NECK: No JVD; No carotid bruits CARDIAC: Normal S1, S2; RRR, no murmurs, rubs, gallops RESPIRATORY:  Clear to auscultation without rales, wheezing or rhonchi ; nonlabored, good air movement. ABDOMEN: Soft, non-tender, non-distended EXTREMITIES:  No edema; No deformity      ASSESSMENT AND PLAN: .    Problem List Items Addressed This Visit       Cardiology Problems   Aortic atherosclerosis (HCC) (Chronic)   Aortic atherosclerosis with plaque buildup, no aneurysm or significant coronary artery disease. Asymptomatic, managed with cholesterol-lowering medications, no new interventions required. - Continue current cholesterol-lowering medications: 40 mg of Crestor  and 10 mg of Zetia .      Hyperlipidemia associated with type 2 diabetes mellitus (HCC) (Chronic)   Type 2 diabetes mellitus with recent A1c of 6.3, current management effective. - Continue monitoring A1c levels. - Maintain current management plan.  Hyperlipidemia Hyperlipidemia managed with 40 mg of Crestor  and 10 mg of Zetia , lipid levels well-controlled. - Continue current medication regimen.      Primary hypertension   Listed as a diagnosis, but pressure is only slightly elevated .current management effective. - Continue monitoring blood pressure. - Maintain current management plan.      Venous insufficiency of both lower extremities - Primary (Chronic)   Chronic lower extremity IMA, recommend foot elevation compression stockings.         Other   Light headedness   Presyncope associated with indigestion and possible vasovagal response Episodes likely related to indigestion and possible vasovagal response, with symptoms improving with activity. No syncope occurred, manageable symptoms, no further testing planned. - Encourage lifestyle modifications, such as avoiding reclining immediately after meals.  Adequate hydration and nutrition.  In the setting of pedal edema, wear support stockings which would help keep pressures up.               Follow-Up: Return in about 1 year (around 12/29/2024).  I spent 43 minutes in the care of Lindsey Barrett today including reviewing labs (1 minute), reviewing studies (3 minutes), face to face time discussing treatment options (24 minutes), reviewing records from prior chart notes.  (4 minutes ), 11 minutes, and documenting in the encounter.        Signed, Alm MICAEL Clay, MD, MS Alm Clay, M.D., M.S. Interventional Chartered certified accountant  Pager # [phone]

## 2023-12-30 NOTE — Patient Instructions (Signed)
 Medication Instructions:   No changes  *If you need a refill on your cardiac medications before your next appointment, please call your pharmacy*   Lab Work: Not needed    Testing/Procedures:   Not needed  Follow-Up: At [facility], you and your health needs are our priority.  As part of our continuing mission to provide you with exceptional heart care, we have created designated Provider Care Teams.  These Care Teams include your primary Cardiologist (physician) and Advanced Practice Providers (APPs -  Physician Assistants and Nurse Practitioners) who all work together to provide you with the care you need, when you need it.     Your next appointment:   12 month(s)  The format for your next appointment:   In Person  Provider:   Jon Hails or Alm Clay, MD

## 2023-12-31 ENCOUNTER — Encounter: Payer: Self-pay | Admitting: Cardiology

## 2023-12-31 DIAGNOSIS — I10 Essential (primary) hypertension: Secondary | ICD-10-CM | POA: Insufficient documentation

## 2023-12-31 DIAGNOSIS — E1169 Type 2 diabetes mellitus with other specified complication: Secondary | ICD-10-CM | POA: Insufficient documentation

## 2023-12-31 NOTE — Assessment & Plan Note (Addendum)
 Presyncope associated with indigestion and possible vasovagal response Episodes likely related to indigestion and possible vasovagal response, with symptoms improving with activity. No syncope occurred, manageable symptoms, no further testing planned. - Encourage lifestyle modifications, such as avoiding reclining immediately after meals.  Adequate hydration and nutrition.  In the setting of pedal edema, wear support stockings which would help keep pressures up.

## 2023-12-31 NOTE — Assessment & Plan Note (Signed)
 Listed as a diagnosis, but pressure is only slightly elevated .current management effective. - Continue monitoring blood pressure. - Maintain current management plan.

## 2023-12-31 NOTE — Assessment & Plan Note (Signed)
 Chronic lower extremity IMA, recommend foot elevation compression stockings.

## 2023-12-31 NOTE — Assessment & Plan Note (Signed)
 Type 2 diabetes mellitus with recent A1c of 6.3, current management effective. - Continue monitoring A1c levels. - Maintain current management plan.  Hyperlipidemia Hyperlipidemia managed with 40 mg of Crestor  and 10 mg of Zetia , lipid levels well-controlled. - Continue current medication regimen.

## 2023-12-31 NOTE — Assessment & Plan Note (Signed)
 Aortic atherosclerosis with plaque buildup, no aneurysm or significant coronary artery disease. Asymptomatic, managed with cholesterol-lowering medications, no new interventions required. - Continue current cholesterol-lowering medications: 40 mg of Crestor  and 10 mg of Zetia .

## 2024-01-13 ENCOUNTER — Encounter: Payer: Self-pay | Admitting: Internal Medicine

## 2024-01-14 NOTE — Progress Notes (Signed)
 "   Subjective:    Patient ID: Lindsey Barrett, female    DOB: 1945/05/29, 78 y.o.   MRN: [id number]      HPI Lindsey Barrett is here for  Chief Complaint  Patient presents with   Leg Pain    Left leg pain   Rash    Rash on right arm   Shoulder Pain    Right shoulder pain      Discussed the use of AI scribe software for clinical note transcription with the patient, who gave verbal consent to proceed.  History of Present Illness Lindsey Barrett is a 78 year old female who presents with concerns about skin changes, leg pain, and shoulder discomfort.  She noticed three red spots on her right lower arm a couple of nights ago, which have since become less prominent. She has a history of black moles, freckles, and dark age spots, but these spots appeared suddenly and were red. There is no associated itching or pain. She has previously had a mole removed, which required a second procedure to achieve clear margins.  She describes a long-standing issue with her legs, which she refers to as 'bad leg,' similar to her mother's condition. She has one area in the posterior left calf where there is a pimple like lesion that is tender to touch.  A sonogram performed years ago showed minimal venous insufficiency. She wears compression socks in the winter but not in the summer due to heat. Recently, after running errands without compression socks, she experienced throbbing pain in her lower leg, which was intermittent and not associated with any knots. She has anxiety about blood clots but has never had one.  She reports shoulder pain with radiation down the arm.. The pain is described as achy, particularly in the muscle and shoulder area. She has a history of rotator cuff tear and frozen shoulder, and she believes this might be related to arthritis. The pain is not severe enough for her to seek orthopedic consultation at this time.       Medications and allergies reviewed with patient and updated  if appropriate.  Current Outpatient Medications on File Prior to Visit  Medication Sig Dispense Refill   aspirin  EC 81 MG tablet Take 1 tablet (81 mg total) by mouth daily.     bimatoprost (LUMIGAN) 0.01 % SOLN Place 1 drop into both eyes at bedtime.     calcium  carbonate (TUMS - DOSED IN MG ELEMENTAL CALCIUM ) 500 MG chewable tablet Chew 1 tablet by mouth as needed for indigestion or heartburn (1000 mg).      Carboxymethylcellulose Sodium (REFRESH LIQUIGEL OP) Apply 1 drop to eye 4 (four) times daily as needed.     Cholecalciferol (VITAMIN D3 PO) Take 5,000 Int'l Units by mouth daily.      [employer] SUNSCREEN SPF 30 EX apply topically to face and body daily for 30     dorzolamide  (TRUSOPT ) 2 % ophthalmic solution Place 1 drop into both eyes 2 (two) times daily.     ezetimibe  (ZETIA ) 10 MG tablet Take 1 tablet (10 mg total) by mouth daily. 90 tablet 2   famotidine  (PEPCID ) 10 MG tablet Take 10 mg by mouth as needed.     ibuprofen  (ADVIL ) 200 MG tablet Take 200 mg by mouth every 6 (six) hours as needed.     rosuvastatin  (CRESTOR ) 40 MG tablet Take 1 tablet by mouth once daily 90 tablet 3   sodium chloride  (MURO 128) 5 % ophthalmic solution  Place 1 drop into both eyes 2 (two) times a day. Before Azopt     Vaginal Lubricant (REPLENS) GEL Place 2 application  vaginally. As needed     [DISCONTINUED] metoprolol  tartrate (LOPRESSOR ) 25 MG tablet Take 1 tablet (25 mg total) by mouth as directed. 1 pill every 8 hours as needed for tachycardia or palpitations. 30 tablet 2   No current facility-administered medications on file prior to visit.    Review of Systems     Objective:   Vitals:   01/15/24 1526  BP: 120/74  Pulse: 70  Temp: 98 F (36.7 C)  SpO2: 97%   BP Readings from Last 3 Encounters:  01/15/24 120/74  12/30/23 134/70  11/19/23 112/64   Wt Readings from Last 3 Encounters:  01/15/24 167 lb (75.8 kg)  12/30/23 170 lb (77.1 kg)  11/19/23 168 lb (76.2 kg)   Body mass index is 28.67  kg/m.    Physical Exam Constitutional:      General: She is not in acute distress.    Appearance: Normal appearance. She is not ill-appearing.  Cardiovascular:     Comments: Small varicose veins b/l LE Musculoskeletal:        General: Tenderness (mild tenderness lateral aspect of right shoulder to upper right lateral arm) present.     Right lower leg: No edema.     Left lower leg: No edema.  Skin:    General: Skin is warm and dry.     Findings: Bruising (tiny ecchymosis right forearm - nontender) and lesion (left posterior calf - papule - nontender - dry skin overlying) present.  Neurological:     Mental Status: She is alert.            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 Compression stockings beneficial. Discussed venous insufficiency and blood pooling.  - Continue wearing compression stockings daily - Elevate legs when sitting to reduce swelling. - Maintain physical activity to promote circulation. - Consider vascular referral if symptoms worsen.  Benign skin lesions (age spots, freckles, moles) Benign skin lesions with recent red spots likely bruises or age spots. No concerning features. Regular dermatological follow-up in place. - Monitor skin lesions for changes. - Continue regular dermatological follow-ups.  Benign lesion vs precancer, left lower extremity Tender lesion ?  benign mode vs precancerous lesion. No infection signs. Not urgent for dermatology unless changes occur. - Monitor the lesion for changes. - has routine dermatology visit scheduled  Right shoulder pain likely due to osteoarthritis and/or tendinitis Chronic right shoulder pain likely due to osteoarthritis and/or tendinitis. Previous rotator cuff tear and frozen shoulder. Possible arthritis. Aware of orthopedics option. - Avoid activities that exacerbate  shoulder pain.       "

## 2024-01-15 ENCOUNTER — Encounter: Payer: Self-pay | Admitting: Internal Medicine

## 2024-01-15 ENCOUNTER — Ambulatory Visit: Admitting: Internal Medicine

## 2024-01-15 VITALS — BP 120/74 | HR 70 | Temp 98.0°F | Ht 64.0 in | Wt 167.0 lb

## 2024-01-15 DIAGNOSIS — L989 Disorder of the skin and subcutaneous tissue, unspecified: Secondary | ICD-10-CM

## 2024-01-15 DIAGNOSIS — I8390 Asymptomatic varicose veins of unspecified lower extremity: Secondary | ICD-10-CM | POA: Diagnosis not present

## 2024-01-15 DIAGNOSIS — M25511 Pain in right shoulder: Secondary | ICD-10-CM | POA: Diagnosis not present

## 2024-01-15 NOTE — Patient Instructions (Signed)
        Medications changes include :   None

## 2024-01-21 DIAGNOSIS — H25811 Combined forms of age-related cataract, right eye: Secondary | ICD-10-CM | POA: Diagnosis not present

## 2024-01-21 DIAGNOSIS — H2511 Age-related nuclear cataract, right eye: Secondary | ICD-10-CM | POA: Diagnosis not present

## 2024-01-27 ENCOUNTER — Other Ambulatory Visit (HOSPITAL_BASED_OUTPATIENT_CLINIC_OR_DEPARTMENT_OTHER): Payer: Self-pay

## 2024-01-27 ENCOUNTER — Other Ambulatory Visit (HOSPITAL_COMMUNITY): Payer: Self-pay

## 2024-01-27 MED FILL — Rosuvastatin Calcium Tab 40 MG: ORAL | 90 days supply | Qty: 90 | Fill #0 | Status: AC

## 2024-01-28 ENCOUNTER — Other Ambulatory Visit (HOSPITAL_BASED_OUTPATIENT_CLINIC_OR_DEPARTMENT_OTHER): Payer: Self-pay

## 2024-02-03 DIAGNOSIS — H2512 Age-related nuclear cataract, left eye: Secondary | ICD-10-CM | POA: Diagnosis not present

## 2024-02-13 ENCOUNTER — Other Ambulatory Visit: Payer: Medicare Other

## 2024-02-15 ENCOUNTER — Encounter: Payer: Self-pay | Admitting: Internal Medicine

## 2024-02-16 ENCOUNTER — Encounter: Payer: Self-pay | Admitting: Internal Medicine

## 2024-02-19 ENCOUNTER — Inpatient Hospital Stay: Attending: Adult Health | Admitting: Adult Health

## 2024-02-19 ENCOUNTER — Encounter: Payer: Self-pay | Admitting: Adult Health

## 2024-02-19 VITALS — BP 129/57 | HR 78 | Temp 98.7°F | Resp 16 | Wt 169.6 lb

## 2024-02-19 DIAGNOSIS — Z170 Estrogen receptor positive status [ER+]: Secondary | ICD-10-CM | POA: Insufficient documentation

## 2024-02-19 DIAGNOSIS — N63 Unspecified lump in unspecified breast: Secondary | ICD-10-CM

## 2024-02-19 DIAGNOSIS — Z79811 Long term (current) use of aromatase inhibitors: Secondary | ICD-10-CM | POA: Insufficient documentation

## 2024-02-19 DIAGNOSIS — C50212 Malignant neoplasm of upper-inner quadrant of left female breast: Secondary | ICD-10-CM | POA: Insufficient documentation

## 2024-02-21 NOTE — Progress Notes (Signed)
 [facility] Follow up:    Lindsey Glade PARAS, MD [address] [name]   DIAGNOSIS: Cancer Staging  Breast cancer of upper-inner quadrant of left female breast [facility]) Staging form: Breast, AJCC 7th Edition - Clinical stage from 05/25/2015: Stage IA (T1c, N0, M0) - Unsigned Staged by: Pathologist and managing physician Laterality: Left Estrogen receptor status: Positive Progesterone receptor status: Positive HER2 status: Negative Stage used in treatment planning: Yes National guidelines used in treatment planning: Yes Type of national guideline used in treatment planning: NCCN Staging comments: Staged at breast conference on 1.18.17 - Pathologic stage from 05/31/2015: Stage IA (T1c, N0, cM0) - Signed by Lindsey [facility] ORN, NP on 01/02/2016 Staged by: Pathologist Laterality: Left Estrogen receptor status: Positive Progesterone receptor status: Positive HER2 status: Negative    SUMMARY OF ONCOLOGIC HISTORY: Oncology History  Breast cancer of upper-inner quadrant of left female breast (HCC)  05/18/2015 Initial Diagnosis   Left breast biopsy: Invasive ductal carcinoma low to intermediate grade, ER 100%, PR 95%, HER-2 negative ratio 1.02, Ki-67 5%, left breast asymmetry 1.3 cm, T1c N0 stage IA   05/31/2015 Surgery   Left lumpectomy Lindsey [facility]): Invasive ductal carcinoma grade 2, 1.5 cm, 0/3 lymph nodes negative, broadly involves inferior margin, ER 100%, PR 95%, HER-2 negative duration 112, Ki-67 5%, T1 cN0 stage IA, Oncotype DX score 18, 12% ROR   05/31/2015 Oncotype testing   Oncotype DX score: 18/12% ROR   07/01/2015 Surgery   Reexcision of the left inferior margin: Fibrocystic changes with UDH   08/10/2015 - 09/16/2015 Radiation Therapy   Adj XRT (Lindsey [facility]). 50.4 Gy in 28 fractions to the left breast   10/06/2015 -  Anti-estrogen oral therapy   Anastrozole  1 mg by mouth daily 5 years, switched to letrozole  11/09/2015 due to dizziness lightheadedness  and fatigue     CURRENT THERAPY: observation  INTERVAL HISTORY:  Discussed the use of AI scribe software for clinical note transcription with the patient, who gave verbal consent to proceed.  History of Present Illness Lindsey Barrett is a 78 year old female with breast cancer who presents with breast changes.  She experiences swelling and tenderness under her left arm for the past two weeks, with a sensation of fullness and tightness. Soreness is primarily under the arm, initially noticed while applying deodorant, with symptoms most pronounced over a weekend and slightly improving by Sunday night, though some tightness persists.  Her last mammogram was in January, showing breast density category C. She has no fevers or chills.    Patient Active Problem List   Diagnosis Date Noted   Hyperlipidemia associated with type 2 diabetes mellitus (HCC) 12/31/2023   Primary hypertension 12/31/2023   Epidermal cyst of neck 10/17/2023   DDD (degenerative disc disease), cervical 05/24/2022   Nevus of toe of right foot 03/27/2022   Venous insufficiency of both lower extremities 03/27/2022   Pain of left calf 03/27/2022   Aortic atherosclerosis 03/27/2022   Dysuria 10/26/2021   Arthritis of carpometacarpal (CMC) joint of left thumb 10/18/2021   Rash 08/18/2021   Light headedness 03/29/2021   Dermatitis 12/09/2020   Low back pain 10/18/2020   COVID-19 08/31/2020   Patellofemoral arthritis of right knee 08/25/2020   Left wrist pain 04/19/2020   Left leg pain 04/18/2020   Acute pain of right knee 03/09/2020   Sensorineural hearing loss (SNHL) of both ears 03/20/2019   Deviated nasal septum 03/02/2019   Pulmonary nodule 07/29/2018   Dupuytren's  contracture of both hands 07/11/2018   Ganglion cyst of volar aspect of left wrist 06/04/2018   Frozen shoulder 03/18/2018   Right rotator cuff tear 10/15/2017   Tinnitus of both ears 01/01/2017   Reaction to internal stress 05/29/2016    Prediabetes 08/10/2015   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   Open-angle glaucoma 06/17/2014   Vitamin D  deficiency 10/18/2011   HSV infection    Atrophic vaginitis    VARICOSE VEINS, LOWER EXTREMITIES 09/28/2009   GERD 07/14/2009   Hyperlipidemia 12/26/2006   GILBERT'S SYNDROME 08/09/2006   DEGENERATIVE DISC DISEASE 08/09/2006    is allergic to arimidex  [anastrozole ], latex, septra  ds [sulfamethoxazole -trimethoprim ], sulfa  antibiotics, sulfur , adhesive [tape], augmentin  [amoxicillin -pot clavulanate], and epinephrine .  MEDICAL HISTORY: 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   Breast cyst    x3   DDD (degenerative disc disease)    Diverticulosis    Fasting hyperglycemia    GERD (gastroesophageal reflux disease)    Bertrum syndrome    Glaucoma    History of hiatal hernia    HSV infection    Hyperlipidemia    Radiation    left breast 50.4 gray   STD (sexually transmitted disease)    HSV    SURGICAL HISTORY: Past Surgical History:  Procedure Laterality Date   APPENDECTOMY  1961   BREAST CYST ASPIRATION     x2 right ,x1 left   COLONOSCOPY  2007   neg   DILATION AND CURETTAGE OF UTERUS  1975   FLEXIBLE SIGMOIDOSCOPY     x2   PELVIC LAPAROSCOPY     RADIOACTIVE SEED GUIDED PARTIAL MASTECTOMY WITH AXILLARY SENTINEL LYMPH NODE BIOPSY Left 05/31/2015   Procedure: LEFT BREAST RADIOACTIVE SEED GUIDED LUMPECTOMY WITH LEFT AXILLARY SENTINEL LYMPH NODE BIOPSY;  Surgeon: Lindsey Bury, MD;  Location: [facility];  Service: General;  Laterality: Left;   RE-EXCISION OF BREAST LUMPECTOMY Left 07/01/2015   Procedure: RE-EXCISION OF LEFT BREAST INFERIOR MARGIN;  Surgeon: Lindsey Bury, MD;  Location: [facility];  Service: General;  Laterality: Left;   UPPER GASTROINTESTINAL  ENDOSCOPY     UPPER GI ENDOSCOPY  10/2010   Dr Obie QUERY TOOTH EXTRACTION  1978    SOCIAL HISTORY: 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  Tobacco Use   Smoking status: Former    Current packs/day: 0.00    Types: Cigarettes    Quit date: 03/28/1999    Years since quitting: 24.9   Smokeless tobacco: Never   Tobacco comments:    smoked 1967-2000, up to 3/4 ppd  Vaping Use   Vaping status: Never Used  Substance and Sexual Activity   Alcohol use: Yes    Alcohol/week: 10.0 standard drinks of alcohol    Types: 10 Cans of beer per week   Drug use: No   Sexual activity: Not Currently    Partners: Male    Birth control/protection: Post-menopausal    Comment: 1st intercourse 78 yo-More than 5 partners  Other Topics Concern  Not on file  Social History Narrative   Occupation:  Loss adjuster, 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      Regular exercise-no   Social Drivers of Health   Financial Resource Strain: Low Risk  (11/19/2023)   Overall Financial Resource Strain (CARDIA)    Difficulty of Paying Living Expenses: Not very hard  Food Insecurity: No Food Insecurity (11/19/2023)   Hunger Vital Sign    Worried About Running Out of Food in the Last Year: Never true    Ran Out of Food in the Last Year: Never true  Transportation Needs: No Transportation Needs (11/19/2023)   PRAPARE - Administrator, Civil Service (Medical): No    Lack of Transportation (Non-Medical): No  Physical Activity: Insufficiently Active (11/19/2023)   Exercise Vital Sign    Days of Exercise per Week: 3 days    Minutes of Exercise per Session: 20 min  Stress: No Stress Concern Present (11/19/2023)   [employer] of  Occupational Health - Occupational Stress Questionnaire    Feeling of Stress: Only a little  Social Connections: Moderately Integrated (11/19/2023)   Social Connection and Isolation Panel    Frequency of Communication with Friends and Family: More than three times a week    Frequency of Social Gatherings with Friends and Family: More than three times a week    Attends Religious Services: More than 4 times per year    Active Member of [employer] or Organizations: Yes    Attends Engineer, structural: More than 4 times per year    Marital Status: Divorced  Intimate Partner Violence: Not At Risk (04/24/2021)   Humiliation, Afraid, Rape, and Kick questionnaire    Fear of Current or Ex-Partner: No    Emotionally Abused: No    Physically Abused: No    Sexually Abused: No    FAMILY HISTORY: Family History  Problem Relation Age of Onset   Lung cancer Mother        smoker   Endometriosis Mother    Cancer Mother        Bladder cancer   Stomach cancer Maternal Grandmother    Coronary artery disease Father    Cancer Father 84       intestinal carcinoid tumors of the ileum   Sudden death Sister        33 week old, congenital deformity   Liver cancer Maternal Uncle    Stroke Paternal Grandfather 29   Leukemia Maternal Uncle    Breast cancer Paternal Aunt        great paternal aunt-Age 62's   Colon cancer Paternal Aunt        lived to 69; pat great aunt   Vascular Disease Paternal Grandmother        thoracic aneurysm   Esophageal cancer Neg Hx    Rectal cancer Neg Hx    Pancreatic cancer Neg Hx     Review of Systems  Constitutional:  Negative for appetite change, chills, fatigue, fever and unexpected weight change.  HENT:   Negative for hearing loss, lump/mass and trouble swallowing.   Eyes:  Negative for eye problems and icterus.  Respiratory:  Negative for chest tightness, cough and shortness of breath.   Cardiovascular:  Negative for chest pain, leg swelling and palpitations.   Gastrointestinal:  Negative for abdominal distention, abdominal pain, constipation, diarrhea, nausea and vomiting.  Endocrine: Negative for hot flashes.  Genitourinary:  Negative for difficulty urinating.   Musculoskeletal:  Negative for arthralgias.  Skin:  Negative for itching and rash.  Neurological:  Negative for dizziness, extremity weakness, headaches and numbness.  Hematological:  Negative for adenopathy. Does not bruise/bleed easily.  Psychiatric/Behavioral:  Negative for depression. The patient is not nervous/anxious.       PHYSICAL EXAMINATION    Vitals:   02/19/24 1006  BP: (!) 129/57  Pulse: 78  Resp: 16  Temp: 98.7 F (37.1 C)  SpO2: 95%    Physical Exam Constitutional:      General: She is not in acute distress.    Appearance: Normal appearance. She is not toxic-appearing.  HENT:     Head: Normocephalic and atraumatic.     Mouth/Throat:     Mouth: Mucous membranes are moist.     Pharynx: Oropharynx is clear. No oropharyngeal exudate or posterior oropharyngeal erythema.  Eyes:     General: No scleral icterus. Cardiovascular:     Rate and Rhythm: Normal rate and regular rhythm.     Pulses: Normal pulses.     Heart sounds: Normal heart sounds.  Pulmonary:     Effort: Pulmonary effort is normal.     Breath sounds: Normal breath sounds.  Chest:     Comments: Right breast is benign, left breast with swelling present, no nodule mass, or TTP Abdominal:     General: Abdomen is flat. Bowel sounds are normal. There is no distension.     Palpations: Abdomen is soft.     Tenderness: There is no abdominal tenderness.  Musculoskeletal:        General: No swelling.     Cervical back: Neck supple.  Lymphadenopathy:     Cervical: No cervical adenopathy.     Upper Body:     Right upper body: No supraclavicular or axillary adenopathy.     Left upper body: No supraclavicular or axillary adenopathy.  Skin:    General: Skin is warm and dry.     Findings: No rash.   Neurological:     General: No focal deficit present.     Mental Status: She is alert.  Psychiatric:        Mood and Affect: Mood normal.        Behavior: Behavior normal.       ASSESSMENT and THERAPY PLAN:   Assessment and Plan Assessment & Plan Left breast swelling Acute swelling and tenderness in the left breast with no infection signs. Differential includes fluid accumulation. Previous breast cancer history raises concern for recurrence, though low likelihood. - Order mammogram and ultrasound of the left breast. - Radiologist to review images and discuss findings with her. - Consider referral to PT if indicated based on mammogram and ultrasound results  Ductal breast cancer, early stage Previous early-stage ductal breast cancer with surgical intervention. Aware of 5% recurrence risk. Current symptoms raise concern for recurrence. - Monitor for recurrence through imaging studies. - Encourage prompt follow-up if new symptoms arise.   RTC as needed based on above findings and results.     All questions were answered. The patient knows to call the clinic with any problems, questions or concerns. We can certainly see the patient much sooner if necessary.  Total encounter time:20 minutes*in face-to-face visit time, chart review, lab review, care coordination, order entry, and documentation of the encounter time.    Morna Kendall, NP 02/21/24 5:24 AM Medical Oncology and Hematology [facility] [address] [place], [place] [address] Tel. [phone]    Fax. [phone]  *Total  Encounter Time as defined by the Centers for Medicare and Medicaid Services includes, in addition to the face-to-face time of a patient visit (documented in the note above) non-face-to-face time: obtaining and reviewing outside history, ordering and reviewing medications, tests or procedures, care coordination (communications with other health care professionals or caregivers) and  documentation in the medical record.

## 2024-02-25 DIAGNOSIS — H25812 Combined forms of age-related cataract, left eye: Secondary | ICD-10-CM | POA: Diagnosis not present

## 2024-02-25 DIAGNOSIS — H2512 Age-related nuclear cataract, left eye: Secondary | ICD-10-CM | POA: Diagnosis not present

## 2024-03-03 DIAGNOSIS — R928 Other abnormal and inconclusive findings on diagnostic imaging of breast: Secondary | ICD-10-CM | POA: Diagnosis not present

## 2024-03-03 DIAGNOSIS — N644 Mastodynia: Secondary | ICD-10-CM | POA: Diagnosis not present

## 2024-03-04 ENCOUNTER — Encounter: Payer: Self-pay | Admitting: Internal Medicine

## 2024-04-19 ENCOUNTER — Encounter: Payer: Self-pay | Admitting: Internal Medicine

## 2024-04-21 ENCOUNTER — Ambulatory Visit: Admitting: Radiology

## 2024-04-21 VITALS — BP 120/70 | HR 85 | Wt 170.0 lb

## 2024-04-21 DIAGNOSIS — N760 Acute vaginitis: Secondary | ICD-10-CM | POA: Diagnosis not present

## 2024-04-21 DIAGNOSIS — B9689 Other specified bacterial agents as the cause of diseases classified elsewhere: Secondary | ICD-10-CM

## 2024-04-21 DIAGNOSIS — N958 Other specified menopausal and perimenopausal disorders: Secondary | ICD-10-CM | POA: Diagnosis not present

## 2024-04-21 DIAGNOSIS — N898 Other specified noninflammatory disorders of vagina: Secondary | ICD-10-CM

## 2024-04-21 LAB — WET PREP FOR TRICH, YEAST, CLUE

## 2024-04-21 MED ORDER — ESTRADIOL 0.01 % VA CREA: 1.0000 g | TOPICAL_CREAM | VAGINAL | 6 refills | Status: AC

## 2024-04-21 MED ORDER — METRONIDAZOLE 0.75 % VA GEL: 1.0000 | Freq: Every day | VAGINAL | 0 refills | Status: AC

## 2024-04-21 NOTE — Progress Notes (Signed)
° ° ° ° °  Subjective: Lindsey [facility] is a 78 y.o. female who complains of intermittent urinary complaints and vaginal complaints. Had vaginal itching last night, none today, denies urinary complaints today. Hx severe vaginal atrophy (Dr. Jannis used teen speculum with difficulty for last exam).     Review of Systems  All other systems reviewed and are negative.   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   Breast cyst    x3   DDD (degenerative disc disease)    Diverticulosis    Fasting hyperglycemia    GERD (gastroesophageal reflux disease)    Bertrum syndrome    Glaucoma    History of hiatal hernia    HSV infection    Hyperlipidemia    Radiation    left breast 50.4 gray   STD (sexually transmitted disease)    HSV      Objective:  Today's Vitals   04/21/24 1210  BP: 120/70  Pulse: 85  SpO2: 96%  Weight: 170 lb (77.1 kg)   Body mass index is 29.18 kg/m.   Physical Exam Vitals and nursing note reviewed. Exam conducted with a chaperone present.  Constitutional:      Appearance: Normal appearance. She is well-developed.  Pulmonary:     Effort: Pulmonary effort is normal.  Abdominal:     General: Abdomen is flat.     Palpations: Abdomen is soft.  Genitourinary:    General: Normal vulva.     Vagina: Vaginal discharge present. No erythema, bleeding or lesions.     Cervix: Normal. No discharge, friability, lesion or erythema.     Uterus: Normal.      Adnexa: Right adnexa normal and left adnexa normal.     Comments: Unable to use speculum, severe atrophy Neurological:     Mental Status: She is alert.  Psychiatric:        Mood and Affect: Mood normal.        Thought Content: Thought content normal.        Judgment: Judgment normal.     Microscopic wet-mount exam shows clue cells.   Darice Hoit, CMA present for  exam  Assessment:/Plan:  1. BV (bacterial vaginosis) (Primary) - metroNIDAZOLE  (METROGEL ) 0.75 % vaginal gel; Place 1 Applicatorful vaginally at bedtime for 5 days.  Dispense: 70 g; Refill: 0  2. Vagina itching - WET PREP FOR TRICH, YEAST, CLUE  3. Genitourinary syndrome of menopause - estradiol  (ESTRACE ) 0.01 % CREA vaginal cream; Place 1 g vaginally 3 (three) times a week.  Dispense: 42.5 g; Refill: 6  Reassured safe to use despite breast cancer hx  Will contact patient with results of testing completed today. Avoid the use of soaps or perfumed products in the peri area. Avoid tub baths and sitting in sweaty or wet clothing for prolonged periods of time.    Avery Eustice B, NP 12:17 PM

## 2024-04-22 ENCOUNTER — Ambulatory Visit: Payer: Self-pay | Admitting: Radiology

## 2024-04-23 ENCOUNTER — Other Ambulatory Visit (HOSPITAL_COMMUNITY): Payer: Self-pay

## 2024-04-23 ENCOUNTER — Other Ambulatory Visit (HOSPITAL_BASED_OUTPATIENT_CLINIC_OR_DEPARTMENT_OTHER): Payer: Self-pay

## 2024-04-23 LAB — URINALYSIS, COMPLETE W/RFL CULTURE
Bilirubin Urine: NEGATIVE
Glucose, UA: NEGATIVE
Hgb urine dipstick: NEGATIVE
Hyaline Cast: NONE SEEN /LPF
Ketones, ur: NEGATIVE
Nitrites, Initial: POSITIVE — AB
Protein, ur: NEGATIVE
RBC / HPF: NONE SEEN /HPF (ref 0–2)
Specific Gravity, Urine: 1.01 (ref 1.001–1.035)
pH: 5.5 (ref 5.0–8.0)

## 2024-04-23 LAB — URINE CULTURE
MICRO NUMBER:: 17361808
SPECIMEN QUALITY:: ADEQUATE

## 2024-04-23 LAB — CULTURE INDICATED

## 2024-04-23 MED ORDER — DORZOLAMIDE HCL 2 % OP SOLN: 1.0000 [drp] | Freq: Two times a day (BID) | OPHTHALMIC | 4 refills | Status: AC

## 2024-04-23 MED ORDER — DORZOLAMIDE HCL 2 % OP SOLN
1.0000 [drp] | Freq: Two times a day (BID) | OPHTHALMIC | 4 refills | Status: AC
  Filled 2024-04-23 (×2): qty 10, 90d supply, fill #0
  Filled 2024-04-23: qty 10, 50d supply, fill #0
  Filled 2024-04-24: qty 10, 90d supply, fill #0
  Filled 2024-06-04: qty 10, 50d supply, fill #1

## 2024-04-24 ENCOUNTER — Other Ambulatory Visit (HOSPITAL_BASED_OUTPATIENT_CLINIC_OR_DEPARTMENT_OTHER): Payer: Self-pay

## 2024-04-24 ENCOUNTER — Other Ambulatory Visit (HOSPITAL_COMMUNITY): Payer: Self-pay

## 2024-04-24 ENCOUNTER — Other Ambulatory Visit: Payer: Self-pay

## 2024-04-24 MED ORDER — AMOXICILLIN-POT CLAVULANATE 875-125 MG PO TABS
1.0000 | ORAL_TABLET | Freq: Two times a day (BID) | ORAL | 0 refills | Status: DC
  Filled 2024-04-24: qty 14, 7d supply, fill #0

## 2024-04-24 MED ORDER — AMOXICILLIN-POT CLAVULANATE 500-125 MG PO TABS
1.0000 | ORAL_TABLET | Freq: Two times a day (BID) | ORAL | 0 refills | Status: AC
  Filled 2024-04-24 (×2): qty 14, 7d supply, fill #0

## 2024-04-24 NOTE — Progress Notes (Signed)
 Ok to send 500mg , BID x 7 days, if sx do not improve will need repeat culture

## 2024-04-27 ENCOUNTER — Encounter: Payer: Self-pay | Admitting: Internal Medicine

## 2024-04-27 ENCOUNTER — Other Ambulatory Visit (HOSPITAL_BASED_OUTPATIENT_CLINIC_OR_DEPARTMENT_OTHER): Payer: Self-pay

## 2024-04-29 ENCOUNTER — Other Ambulatory Visit: Payer: Self-pay

## 2024-05-04 ENCOUNTER — Encounter: Payer: Self-pay | Admitting: Internal Medicine

## 2024-05-06 ENCOUNTER — Ambulatory Visit (INDEPENDENT_AMBULATORY_CARE_PROVIDER_SITE_OTHER)

## 2024-05-06 DIAGNOSIS — Z23 Encounter for immunization: Secondary | ICD-10-CM | POA: Diagnosis not present

## 2024-05-06 NOTE — Progress Notes (Signed)
 After obtaining consent, and per orders of Dr. Geofm, injection of HDFLU given by Ronnald SHAUNNA Palms. Patient instructed to report any adverse reaction to me immediately.

## 2024-05-08 ENCOUNTER — Other Ambulatory Visit (HOSPITAL_BASED_OUTPATIENT_CLINIC_OR_DEPARTMENT_OTHER): Payer: Self-pay

## 2024-05-08 MED FILL — Rosuvastatin Calcium Tab 40 MG: ORAL | 90 days supply | Qty: 90 | Fill #1 | Status: AC

## 2024-05-09 ENCOUNTER — Other Ambulatory Visit (HOSPITAL_BASED_OUTPATIENT_CLINIC_OR_DEPARTMENT_OTHER): Payer: Self-pay

## 2024-05-11 ENCOUNTER — Telehealth: Payer: Self-pay

## 2024-05-11 ENCOUNTER — Other Ambulatory Visit (HOSPITAL_BASED_OUTPATIENT_CLINIC_OR_DEPARTMENT_OTHER): Payer: Self-pay

## 2024-05-11 NOTE — Telephone Encounter (Signed)
 Pt called to find out if it is OK for her have estradiol  cream vaginally d/t vaginal atrophy, recurrent UTIs and bacterial vaginosis. Advised pt per MD he is OK with topical and not systemic. She verbalized thanks and understanding.

## 2024-05-12 ENCOUNTER — Other Ambulatory Visit (HOSPITAL_BASED_OUTPATIENT_CLINIC_OR_DEPARTMENT_OTHER): Payer: Self-pay

## 2024-05-25 ENCOUNTER — Other Ambulatory Visit: Payer: Self-pay

## 2024-05-27 LAB — HM MAMMOGRAPHY

## 2024-05-28 ENCOUNTER — Ambulatory Visit: Payer: Self-pay | Admitting: Obstetrics and Gynecology

## 2024-05-28 ENCOUNTER — Encounter: Payer: Self-pay | Admitting: Internal Medicine

## 2024-06-04 ENCOUNTER — Other Ambulatory Visit (HOSPITAL_BASED_OUTPATIENT_CLINIC_OR_DEPARTMENT_OTHER): Payer: Self-pay

## 2024-06-05 ENCOUNTER — Other Ambulatory Visit (HOSPITAL_BASED_OUTPATIENT_CLINIC_OR_DEPARTMENT_OTHER): Payer: Self-pay

## 2024-06-08 ENCOUNTER — Other Ambulatory Visit (HOSPITAL_BASED_OUTPATIENT_CLINIC_OR_DEPARTMENT_OTHER): Payer: Self-pay

## 2024-06-09 ENCOUNTER — Other Ambulatory Visit (HOSPITAL_BASED_OUTPATIENT_CLINIC_OR_DEPARTMENT_OTHER): Payer: Self-pay

## 2024-06-10 ENCOUNTER — Other Ambulatory Visit (HOSPITAL_BASED_OUTPATIENT_CLINIC_OR_DEPARTMENT_OTHER): Payer: Self-pay

## 2024-06-25 ENCOUNTER — Encounter: Payer: Medicare Other | Admitting: Obstetrics and Gynecology

## 2024-07-01 ENCOUNTER — Encounter: Admitting: Obstetrics and Gynecology
